# Patient Record
Sex: Female | Born: 1960 | Race: Black or African American | Hispanic: No | Marital: Single | Smoking: Former smoker
Health system: Southern US, Community
[De-identification: ages and names within clinical notes are randomized; demographics above are authoritative.]

## PROBLEM LIST (undated history)

## (undated) DIAGNOSIS — I10 Essential (primary) hypertension: Secondary | ICD-10-CM

## (undated) DIAGNOSIS — M6282 Rhabdomyolysis: Secondary | ICD-10-CM

## (undated) DIAGNOSIS — E785 Hyperlipidemia, unspecified: Secondary | ICD-10-CM

## (undated) HISTORY — PX: ABDOMINAL HYSTERECTOMY: SHX81

## (undated) HISTORY — DX: Essential (primary) hypertension: I10

## (undated) HISTORY — PX: REDUCTION MAMMAPLASTY: SUR839

## (undated) HISTORY — DX: Hyperlipidemia, unspecified: E78.5

---

## 1998-02-04 ENCOUNTER — Ambulatory Visit: Admission: RE | Admit: 1998-02-04 | Discharge: 1998-02-04 | Payer: Self-pay

## 1999-02-06 ENCOUNTER — Other Ambulatory Visit: Admission: RE | Admit: 1999-02-06 | Discharge: 1999-02-06 | Payer: Self-pay | Admitting: Obstetrics and Gynecology

## 1999-05-27 ENCOUNTER — Encounter: Payer: Self-pay | Admitting: Specialist

## 1999-05-30 ENCOUNTER — Ambulatory Visit (HOSPITAL_COMMUNITY): Admission: RE | Admit: 1999-05-30 | Discharge: 1999-05-31 | Payer: Self-pay | Admitting: Specialist

## 1999-05-30 ENCOUNTER — Encounter (INDEPENDENT_AMBULATORY_CARE_PROVIDER_SITE_OTHER): Payer: Self-pay

## 2000-09-01 ENCOUNTER — Other Ambulatory Visit: Admission: RE | Admit: 2000-09-01 | Discharge: 2000-09-01 | Payer: Self-pay | Admitting: Obstetrics and Gynecology

## 2000-10-05 ENCOUNTER — Encounter: Payer: Self-pay | Admitting: Obstetrics and Gynecology

## 2000-10-05 ENCOUNTER — Encounter: Admission: RE | Admit: 2000-10-05 | Discharge: 2000-10-05 | Payer: Self-pay | Admitting: Obstetrics and Gynecology

## 2000-10-11 ENCOUNTER — Encounter: Payer: Self-pay | Admitting: *Deleted

## 2000-10-11 ENCOUNTER — Ambulatory Visit (HOSPITAL_COMMUNITY): Admission: RE | Admit: 2000-10-11 | Discharge: 2000-10-11 | Payer: Self-pay | Admitting: *Deleted

## 2001-07-04 ENCOUNTER — Ambulatory Visit (HOSPITAL_BASED_OUTPATIENT_CLINIC_OR_DEPARTMENT_OTHER): Admission: RE | Admit: 2001-07-04 | Discharge: 2001-07-04 | Payer: Self-pay | Admitting: Specialist

## 2001-07-04 ENCOUNTER — Encounter (INDEPENDENT_AMBULATORY_CARE_PROVIDER_SITE_OTHER): Payer: Self-pay | Admitting: *Deleted

## 2002-01-16 ENCOUNTER — Other Ambulatory Visit: Admission: RE | Admit: 2002-01-16 | Discharge: 2002-01-16 | Payer: Self-pay | Admitting: Obstetrics and Gynecology

## 2002-12-13 ENCOUNTER — Encounter: Admission: RE | Admit: 2002-12-13 | Discharge: 2002-12-13 | Payer: Self-pay | Admitting: Obstetrics and Gynecology

## 2002-12-13 ENCOUNTER — Encounter: Payer: Self-pay | Admitting: Obstetrics and Gynecology

## 2003-04-20 ENCOUNTER — Other Ambulatory Visit: Admission: RE | Admit: 2003-04-20 | Discharge: 2003-04-20 | Payer: Self-pay | Admitting: Obstetrics and Gynecology

## 2004-02-26 ENCOUNTER — Encounter: Admission: RE | Admit: 2004-02-26 | Discharge: 2004-02-26 | Payer: Self-pay | Admitting: Obstetrics and Gynecology

## 2004-08-12 ENCOUNTER — Ambulatory Visit: Payer: Self-pay | Admitting: Family Medicine

## 2004-08-18 ENCOUNTER — Ambulatory Visit: Payer: Self-pay | Admitting: Family Medicine

## 2004-09-12 ENCOUNTER — Ambulatory Visit: Payer: Self-pay | Admitting: Family Medicine

## 2005-05-06 ENCOUNTER — Encounter: Admission: RE | Admit: 2005-05-06 | Discharge: 2005-05-06 | Payer: Self-pay | Admitting: Obstetrics and Gynecology

## 2005-07-16 ENCOUNTER — Ambulatory Visit: Payer: Self-pay | Admitting: Family Medicine

## 2005-08-10 ENCOUNTER — Ambulatory Visit: Payer: Self-pay | Admitting: Family Medicine

## 2006-09-28 ENCOUNTER — Encounter: Admission: RE | Admit: 2006-09-28 | Discharge: 2006-09-28 | Payer: Self-pay | Admitting: Obstetrics and Gynecology

## 2006-09-28 IMAGING — MG MM SCREEN MAMMOGRAM BILATERAL
4 series · 4 of 4 positions shown · non-contrast
Comparison: none

DG SCREEN MAMMOGRAM BILATERAL
Bilateral CC and MLO view(s) were taken.
Technologist: LOCKLEAR
Prior study comparison: [DATE], bilateral screening mammogram.

DIGITAL SCREENING MAMMOGRAM WITH CAD:
There are scattered fibroglandular densities.  There is no dominant mass, architectural distortion 
or calcification to suggest malignancy. Reduction changes are present.

[R CC]
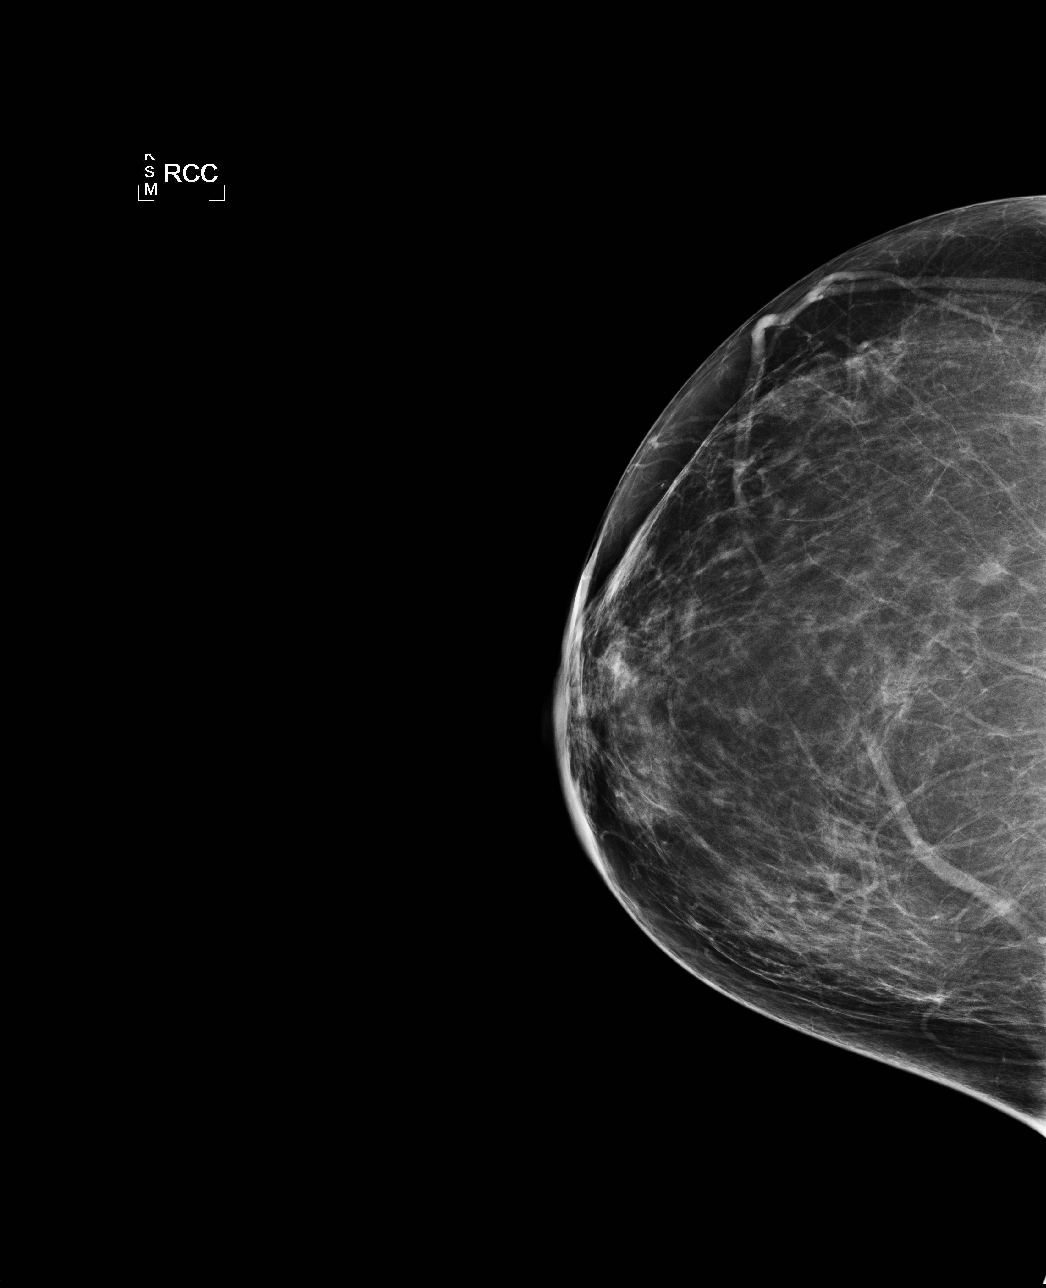

[L CC]
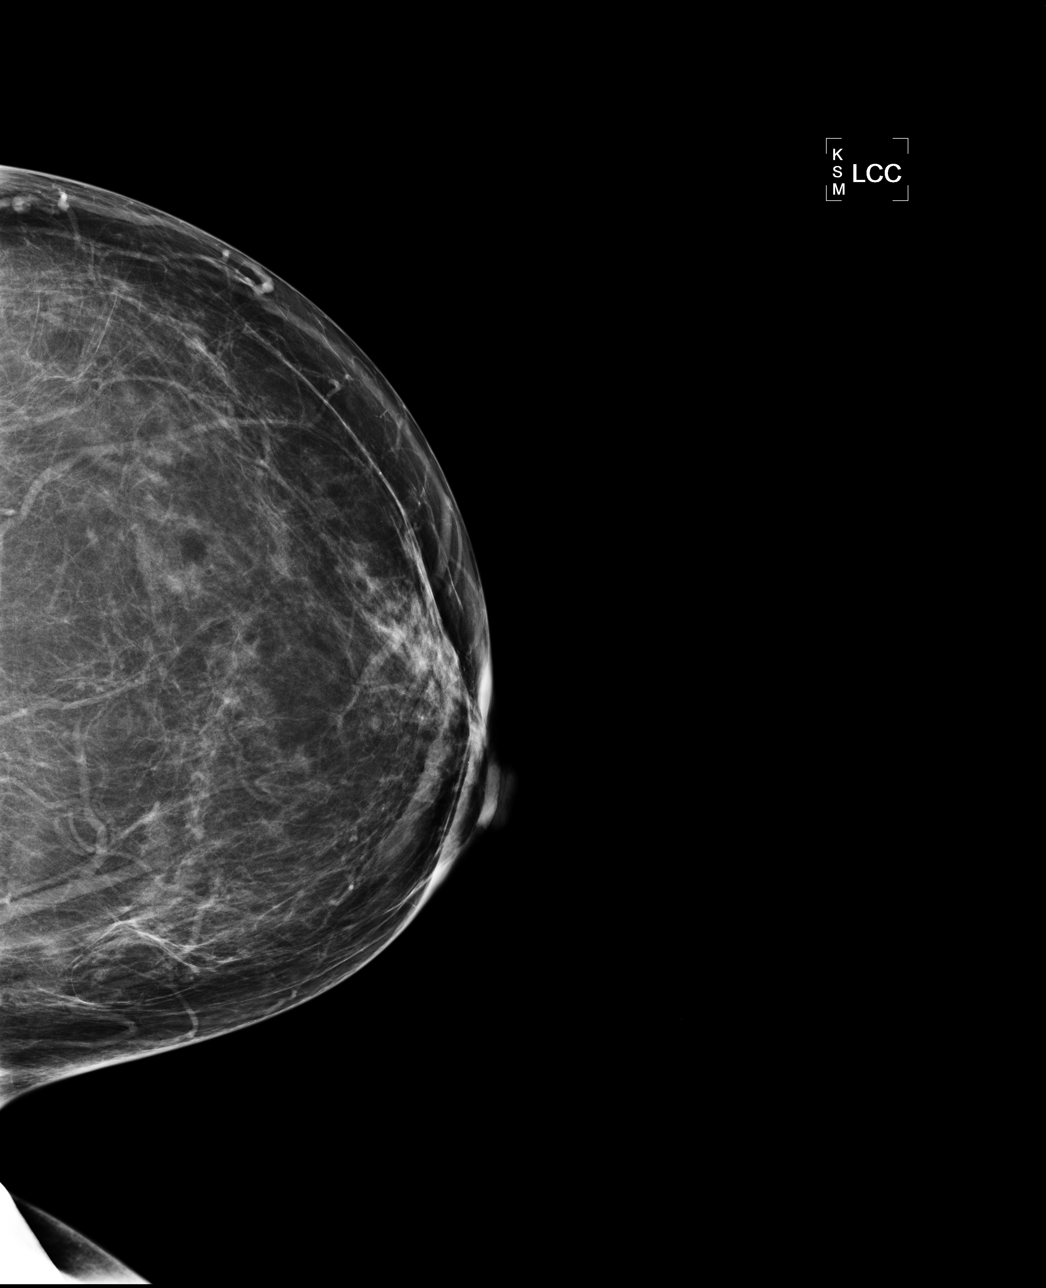

[L MLO]
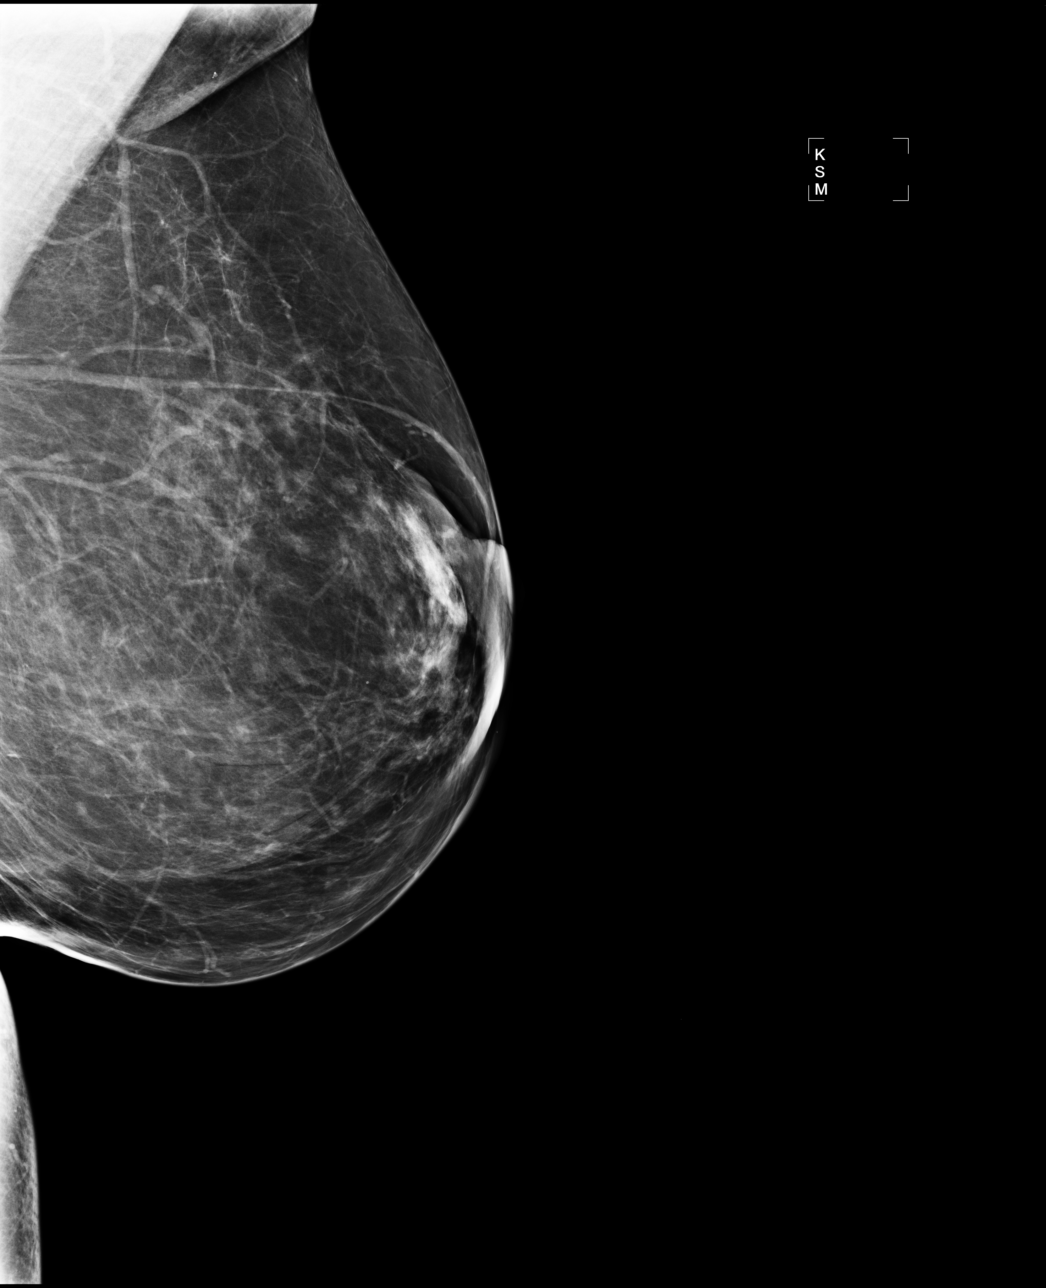

[R MLO]
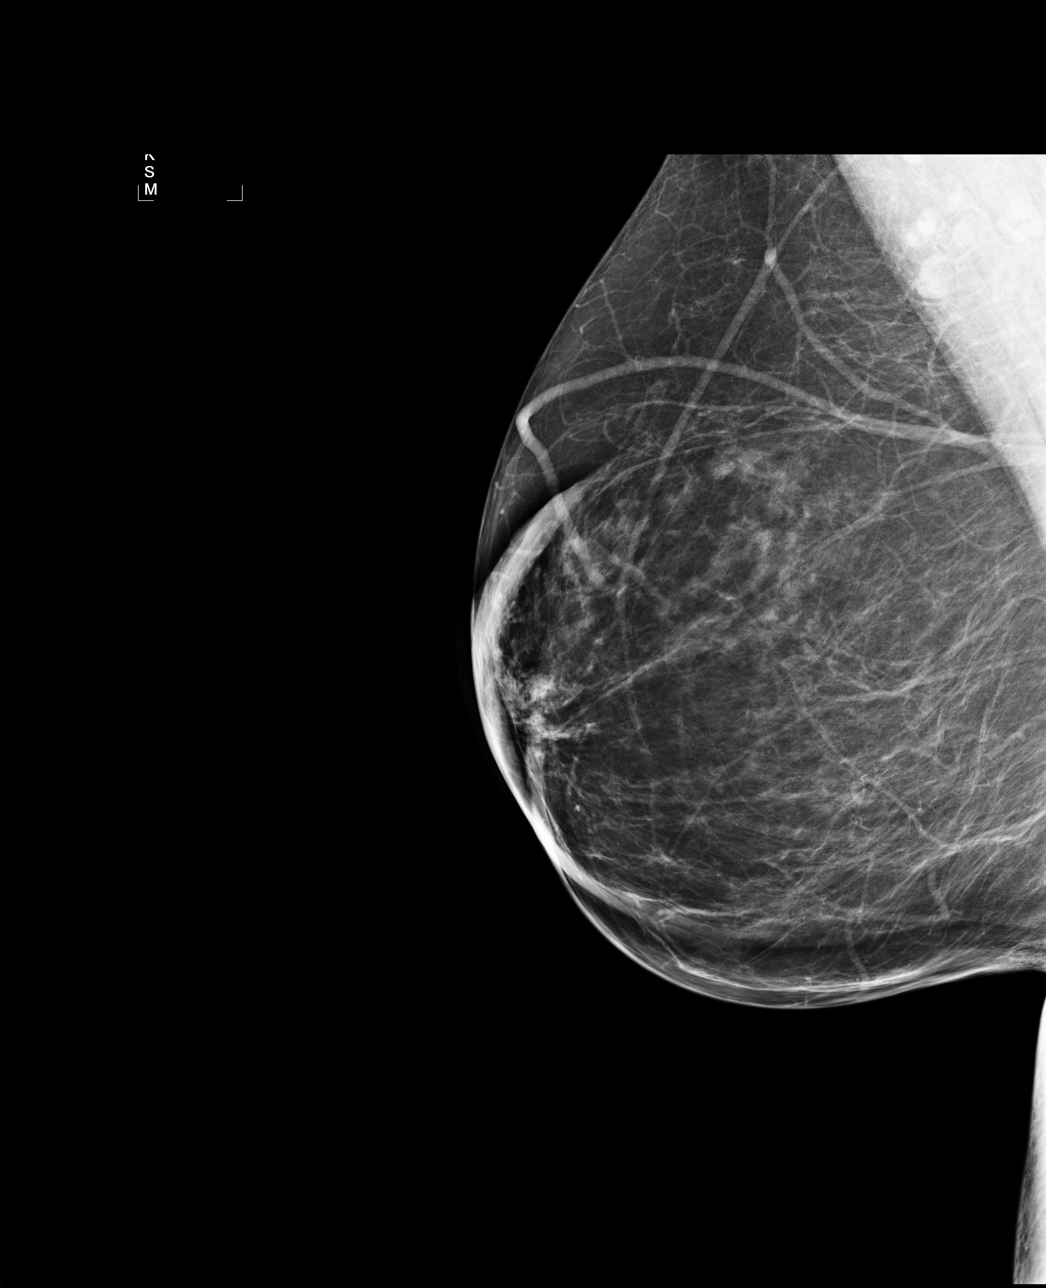

[4 of 4 positions shown; findings below may reference images not displayed]

IMPRESSION: No mammographic evidence of malignancy.  Suggest yearly screening mammography.

ASSESSMENT: Negative - BI-RADS 1

Screening mammogram in 1 year.
ANALYZED BY COMPUTER AIDED DETECTION. , THIS PROCEDURE WAS A DIGITAL MAMMOGRAM.

## 2007-02-25 ENCOUNTER — Ambulatory Visit: Payer: Self-pay | Admitting: Family Medicine

## 2007-02-25 DIAGNOSIS — R079 Chest pain, unspecified: Secondary | ICD-10-CM | POA: Insufficient documentation

## 2007-02-25 DIAGNOSIS — Z9189 Other specified personal risk factors, not elsewhere classified: Secondary | ICD-10-CM | POA: Insufficient documentation

## 2007-02-25 DIAGNOSIS — I10 Essential (primary) hypertension: Secondary | ICD-10-CM | POA: Insufficient documentation

## 2007-03-03 ENCOUNTER — Encounter: Payer: Self-pay | Admitting: Family Medicine

## 2007-03-03 LAB — CONVERTED CEMR LAB
ALT: 22 units/L (ref 0–35)
Basophils Relative: 0.6 % (ref 0.0–1.0)
Bilirubin, Direct: 0.1 mg/dL (ref 0.0–0.3)
CO2: 29 meq/L (ref 19–32)
Calcium: 9.6 mg/dL (ref 8.4–10.5)
Creatinine, Ser: 1 mg/dL (ref 0.4–1.2)
Eosinophils Relative: 2.8 % (ref 0.0–5.0)
GFR calc Af Amer: 76 mL/min
Glucose, Bld: 90 mg/dL (ref 70–99)
Hemoglobin: 13.1 g/dL (ref 12.0–15.0)
Lymphocytes Relative: 33.1 % (ref 12.0–46.0)
Monocytes Absolute: 0.5 10*3/uL (ref 0.2–0.7)
Neutro Abs: 3.7 10*3/uL (ref 1.4–7.7)
Platelets: 359 10*3/uL (ref 150–400)
RDW: 12.9 % (ref 11.5–14.6)
Total Bilirubin: 0.8 mg/dL (ref 0.3–1.2)
Total Protein: 7.5 g/dL (ref 6.0–8.3)
VLDL: 18 mg/dL (ref 0–40)
WBC: 6.6 10*3/uL (ref 4.5–10.5)

## 2007-03-09 ENCOUNTER — Ambulatory Visit: Payer: Self-pay

## 2007-03-09 ENCOUNTER — Encounter: Payer: Self-pay | Admitting: Family Medicine

## 2007-05-02 ENCOUNTER — Encounter (INDEPENDENT_AMBULATORY_CARE_PROVIDER_SITE_OTHER): Payer: Self-pay | Admitting: *Deleted

## 2007-06-07 ENCOUNTER — Ambulatory Visit: Payer: Self-pay | Admitting: Family Medicine

## 2007-06-13 ENCOUNTER — Encounter (INDEPENDENT_AMBULATORY_CARE_PROVIDER_SITE_OTHER): Payer: Self-pay | Admitting: *Deleted

## 2007-06-13 LAB — CONVERTED CEMR LAB
AST: 29 units/L (ref 0–37)
Albumin: 3.9 g/dL (ref 3.5–5.2)
Bilirubin, Direct: 0.1 mg/dL (ref 0.0–0.3)
Total Bilirubin: 0.8 mg/dL (ref 0.3–1.2)
Total Protein: 7.7 g/dL (ref 6.0–8.3)

## 2007-07-14 ENCOUNTER — Encounter: Payer: Self-pay | Admitting: Family Medicine

## 2007-07-18 ENCOUNTER — Telehealth (INDEPENDENT_AMBULATORY_CARE_PROVIDER_SITE_OTHER): Payer: Self-pay | Admitting: *Deleted

## 2007-07-28 ENCOUNTER — Encounter: Payer: Self-pay | Admitting: Family Medicine

## 2007-08-18 ENCOUNTER — Ambulatory Visit: Payer: Self-pay | Admitting: Family Medicine

## 2007-08-18 DIAGNOSIS — IMO0001 Reserved for inherently not codable concepts without codable children: Secondary | ICD-10-CM | POA: Insufficient documentation

## 2007-08-18 DIAGNOSIS — E785 Hyperlipidemia, unspecified: Secondary | ICD-10-CM | POA: Insufficient documentation

## 2007-08-25 ENCOUNTER — Telehealth (INDEPENDENT_AMBULATORY_CARE_PROVIDER_SITE_OTHER): Payer: Self-pay | Admitting: *Deleted

## 2007-08-25 LAB — CONVERTED CEMR LAB
CRP, High Sensitivity: 1 (ref 0.00–5.00)
Total CK: 322 units/L (ref 7–177)

## 2007-08-30 ENCOUNTER — Encounter (INDEPENDENT_AMBULATORY_CARE_PROVIDER_SITE_OTHER): Payer: Self-pay | Admitting: *Deleted

## 2007-10-04 ENCOUNTER — Encounter: Admission: RE | Admit: 2007-10-04 | Discharge: 2007-10-04 | Payer: Self-pay | Admitting: Obstetrics and Gynecology

## 2008-02-02 ENCOUNTER — Ambulatory Visit: Payer: Self-pay | Admitting: Family Medicine

## 2008-02-02 ENCOUNTER — Encounter (INDEPENDENT_AMBULATORY_CARE_PROVIDER_SITE_OTHER): Payer: Self-pay | Admitting: *Deleted

## 2008-02-02 DIAGNOSIS — R5381 Other malaise: Secondary | ICD-10-CM | POA: Insufficient documentation

## 2008-02-02 DIAGNOSIS — R5383 Other fatigue: Secondary | ICD-10-CM

## 2008-02-06 ENCOUNTER — Telehealth (INDEPENDENT_AMBULATORY_CARE_PROVIDER_SITE_OTHER): Payer: Self-pay | Admitting: *Deleted

## 2008-02-14 ENCOUNTER — Encounter (INDEPENDENT_AMBULATORY_CARE_PROVIDER_SITE_OTHER): Payer: Self-pay | Admitting: *Deleted

## 2008-02-14 LAB — CONVERTED CEMR LAB
ALT: 36 units/L — ABNORMAL HIGH (ref 0–35)
Albumin: 4.3 g/dL (ref 3.5–5.2)
Alkaline Phosphatase: 57 units/L (ref 39–117)
BUN: 14 mg/dL (ref 6–23)
Basophils Absolute: 0 10*3/uL (ref 0.0–0.1)
Basophils Relative: 0.5 % (ref 0.0–3.0)
Bilirubin, Direct: 0.1 mg/dL (ref 0.0–0.3)
CO2: 26 meq/L (ref 19–32)
Calcium: 10 mg/dL (ref 8.4–10.5)
Eosinophils Absolute: 0.1 10*3/uL (ref 0.0–0.7)
Eosinophils Relative: 2.3 % (ref 0.0–5.0)
GFR calc Af Amer: 86 mL/min
Glucose, Bld: 92 mg/dL (ref 70–99)
HCT: 39.5 % (ref 36.0–46.0)
HDL: 53.3 mg/dL (ref >39.0)
Hemoglobin: 13.4 g/dL (ref 12.0–15.0)
MCHC: 34 g/dL (ref 30.0–36.0)
MCV: 92.7 fL (ref 78.0–100.0)
Neutro Abs: 3.1 10*3/uL (ref 1.4–7.7)
RBC: 4.25 M/uL (ref 3.87–5.11)
TSH: 1.73 microintl units/mL (ref 0.35–5.50)
Total CK: 378 units/L (ref 7–177)
Total Protein: 7.8 g/dL (ref 6.0–8.3)
WBC: 6 10*3/uL (ref 4.5–10.5)

## 2008-02-15 ENCOUNTER — Encounter (INDEPENDENT_AMBULATORY_CARE_PROVIDER_SITE_OTHER): Payer: Self-pay | Admitting: *Deleted

## 2008-06-05 ENCOUNTER — Encounter: Payer: Self-pay | Admitting: Family Medicine

## 2008-08-24 ENCOUNTER — Telehealth: Payer: Self-pay | Admitting: Family Medicine

## 2008-08-24 ENCOUNTER — Emergency Department (HOSPITAL_BASED_OUTPATIENT_CLINIC_OR_DEPARTMENT_OTHER): Admission: EM | Admit: 2008-08-24 | Discharge: 2008-08-24 | Payer: Self-pay | Admitting: Emergency Medicine

## 2008-08-24 ENCOUNTER — Ambulatory Visit: Payer: Self-pay | Admitting: Diagnostic Radiology

## 2008-08-24 IMAGING — CR DG CHEST 2V
2 series · 2 of 2 positions shown · non-contrast
Comparison: None.

CLINICAL DATA: Chest pain.  Shortness of breath.  History of
hypertension.

CHEST - 2 VIEW [DATE]:

[w chest pa]
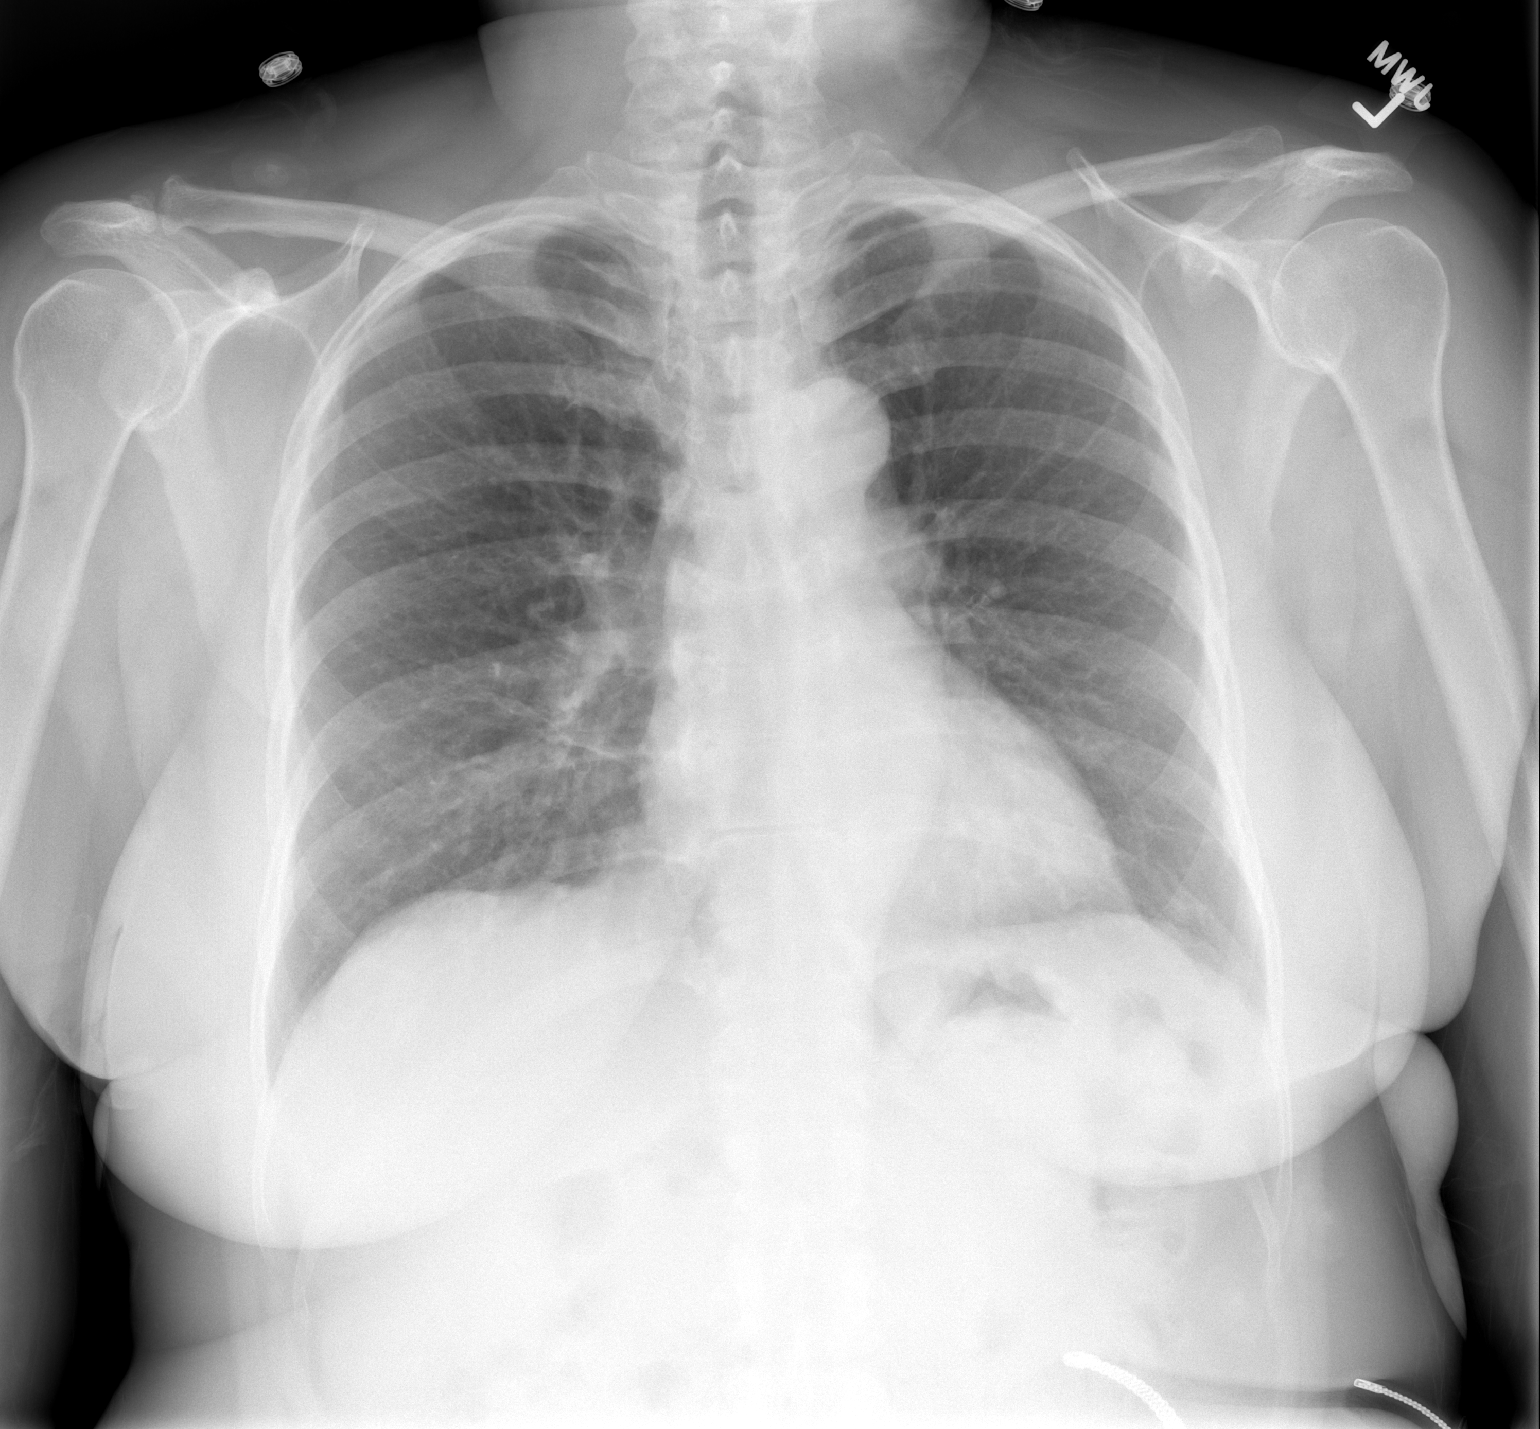

[w chest lat]
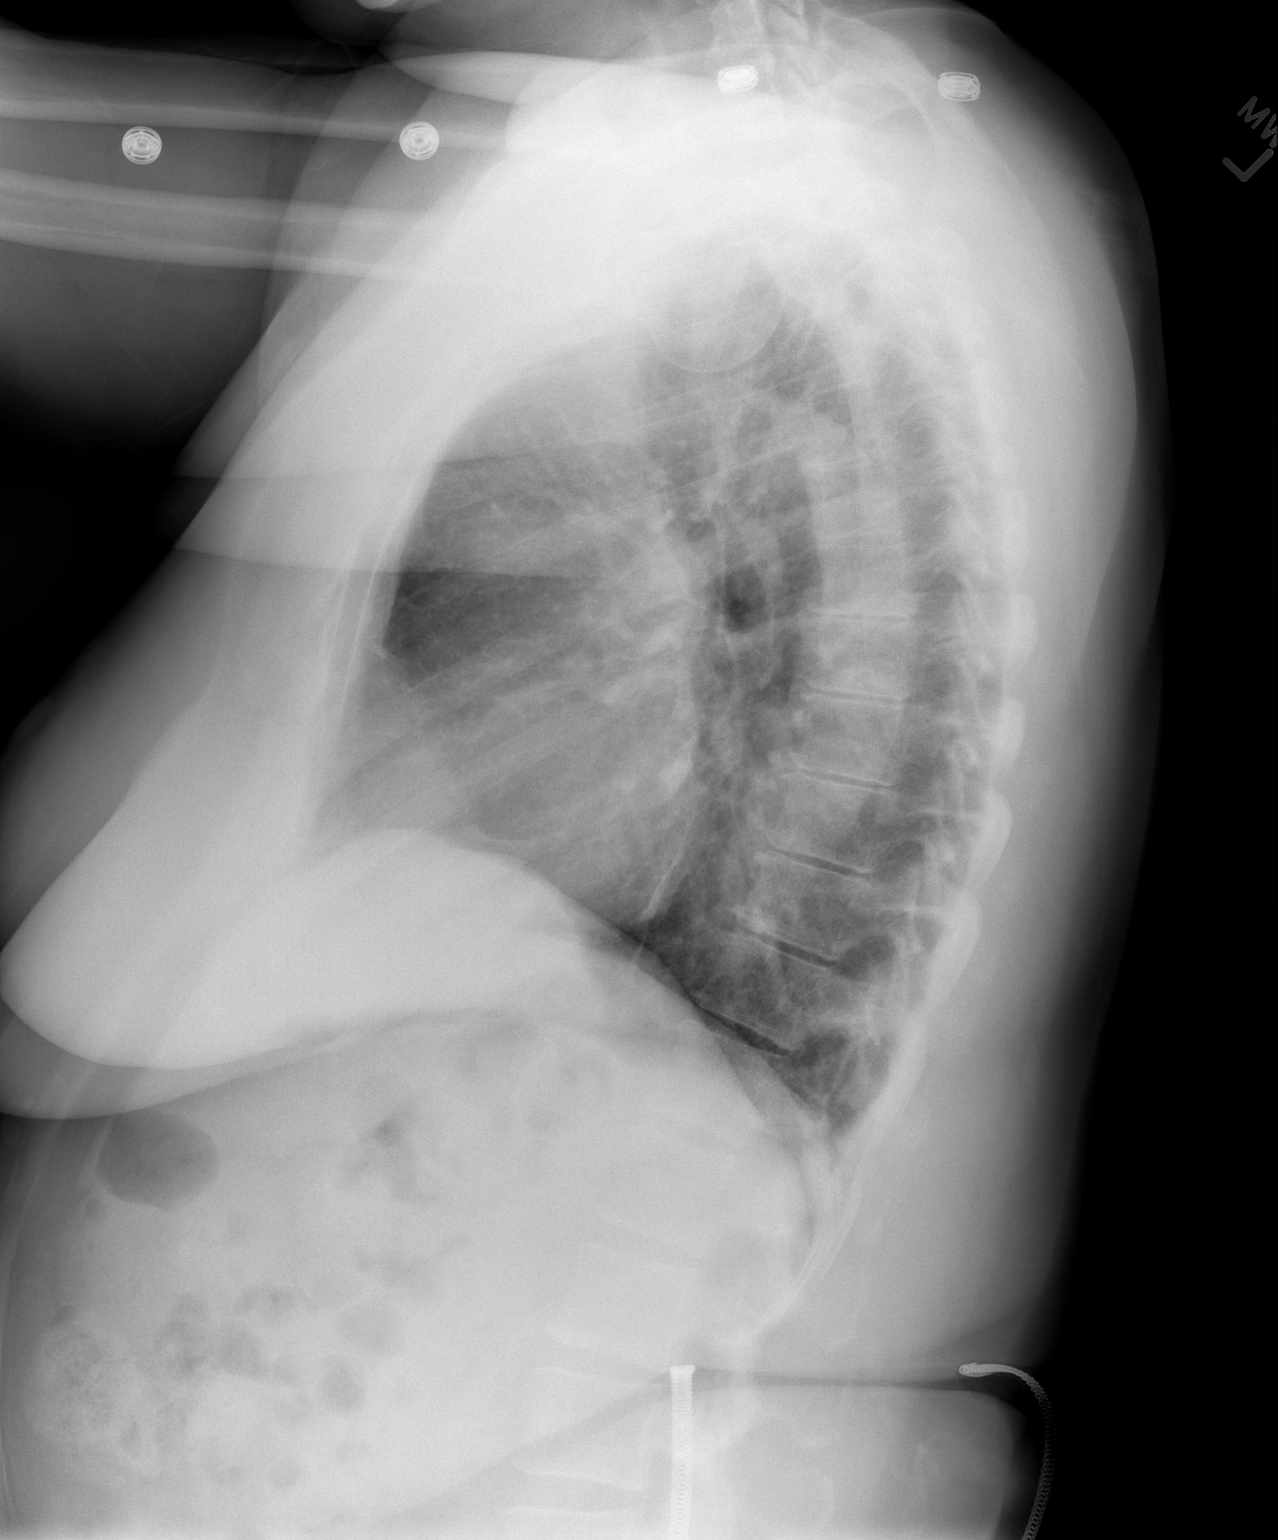

[2 of 2 positions shown; findings below may reference images not displayed]

FINDINGS: Heart size normal.  Thoracic aorta tortuous.  Hilar and
mediastinal contours otherwise unremarkable.  Lungs clear.
Bronchovascular markings normal.  Pulmonary vascularity normal.  No
pleural effusions.  Mild degenerative changes throughout the
thoracic spine.
IMPRESSION: No acute cardiopulmonary disease.  Tortuous thoracic aorta
consistent with the history of hypertension.

## 2008-11-26 ENCOUNTER — Ambulatory Visit: Payer: Self-pay | Admitting: Family Medicine

## 2008-11-27 ENCOUNTER — Encounter: Admission: RE | Admit: 2008-11-27 | Discharge: 2008-11-27 | Payer: Self-pay | Admitting: Obstetrics and Gynecology

## 2008-11-27 IMAGING — MG MM SCREEN MAMMOGRAM BILATERAL
4 series · 4 of 4 positions shown · non-contrast
Comparison: Prior studies.

DG SCREEN MAMMOGRAM BILATERAL
Bilateral CC and MLO view(s) were taken.
Prior study comparison: [DATE], DG screen mammogram bilateral.

DIGITAL SCREENING MAMMOGRAM WITH CAD:

[R CC]
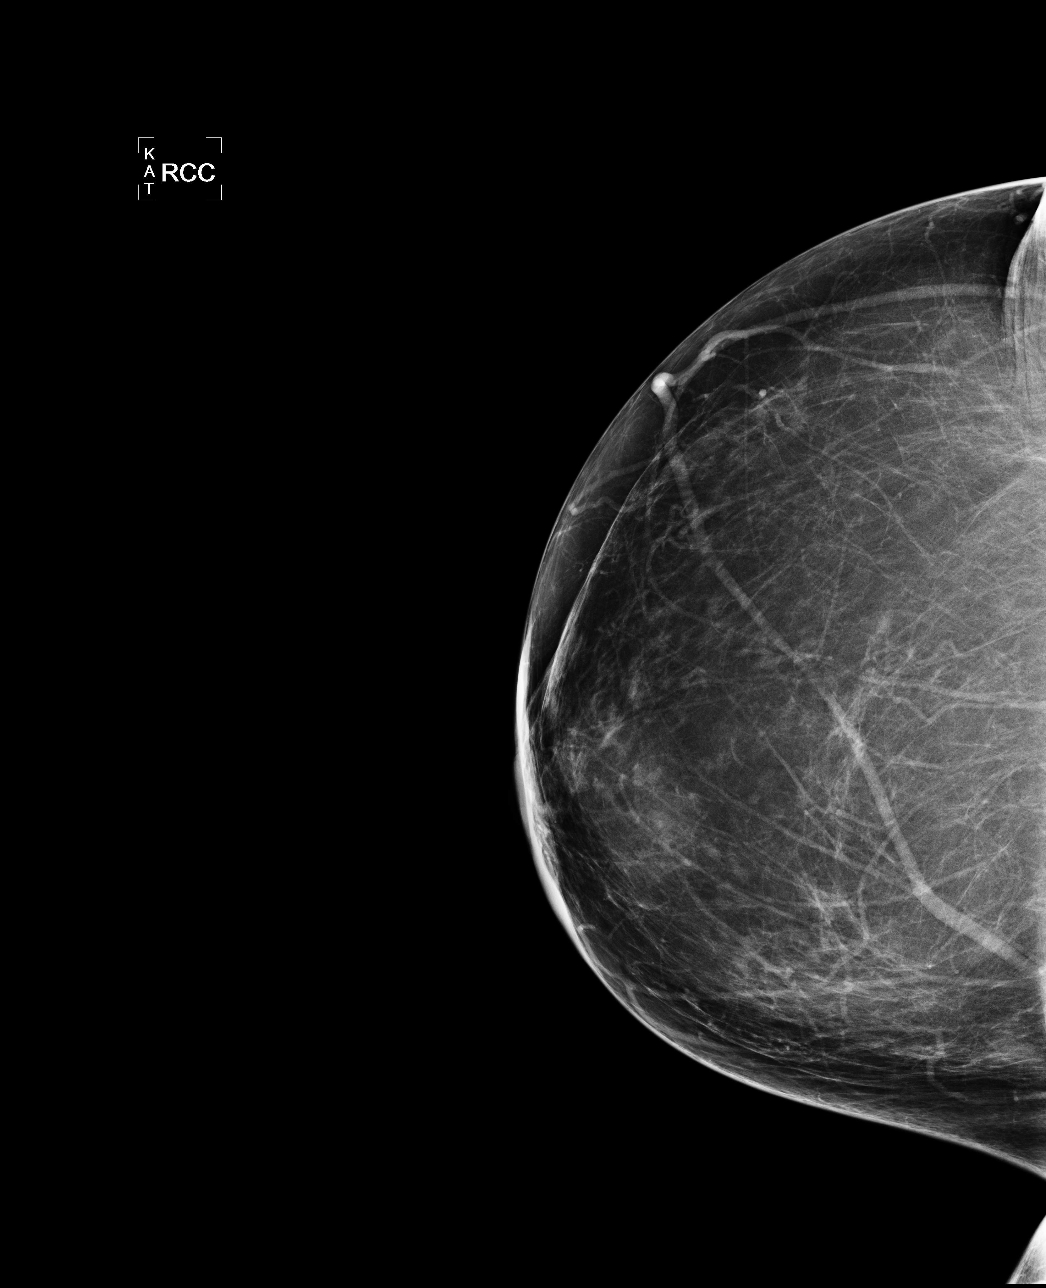

[L CC]
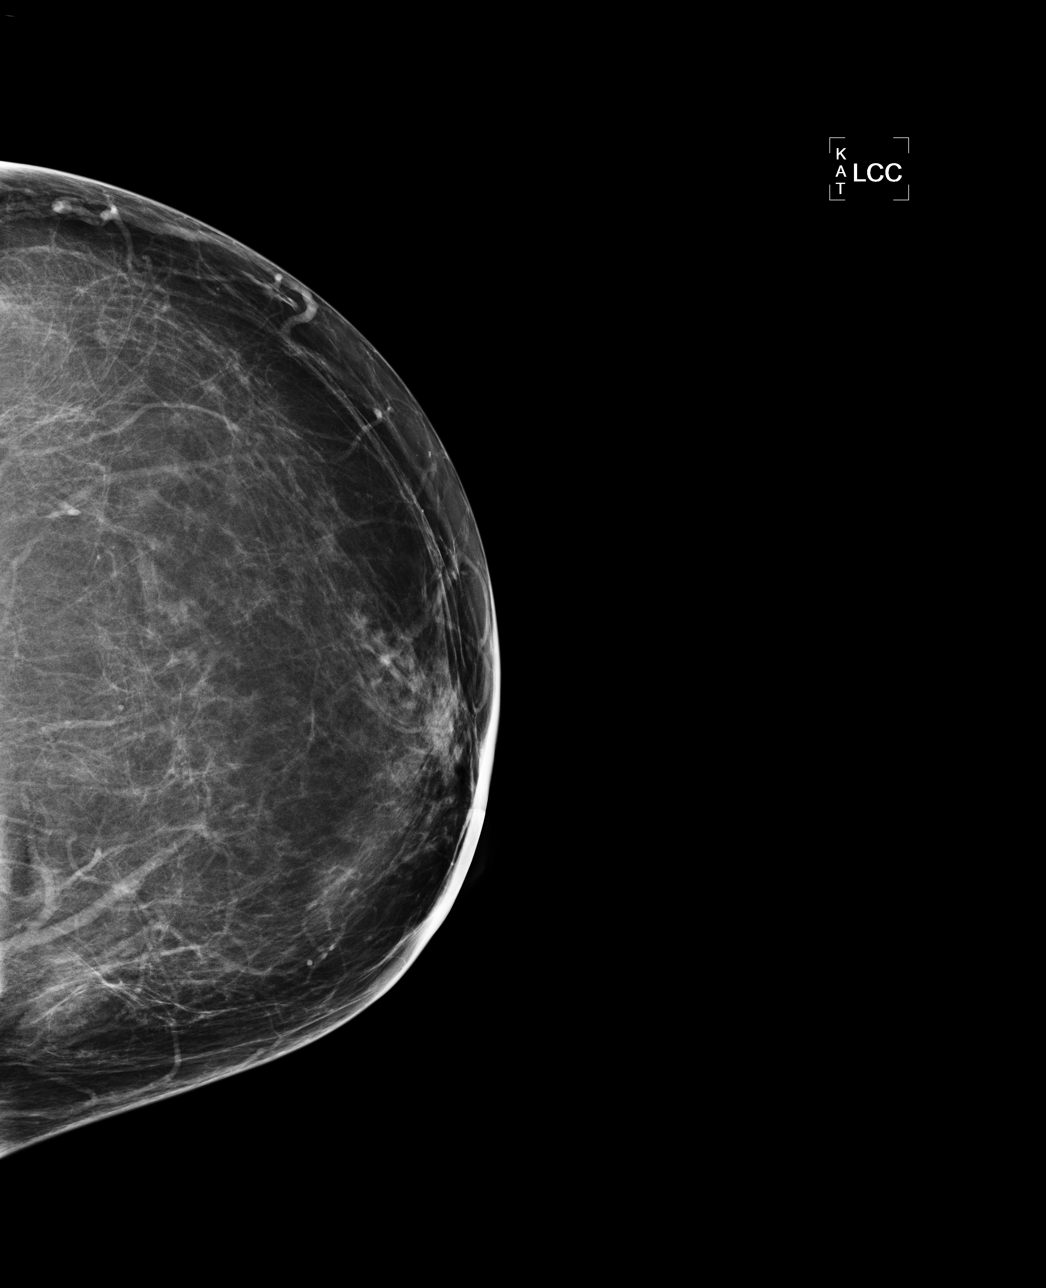

[L MLO]
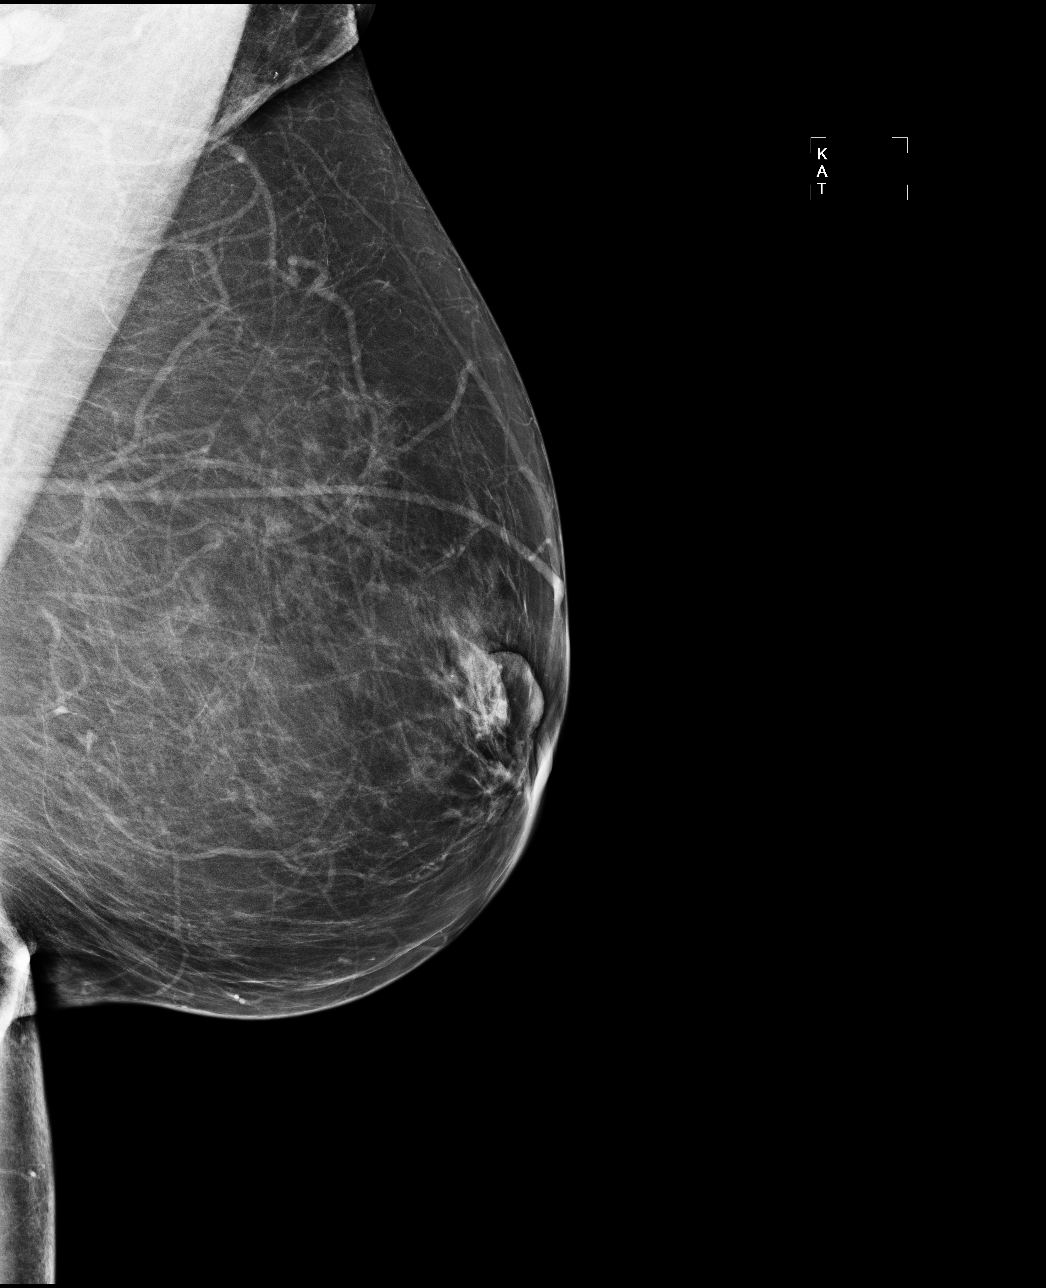

[R MLO]
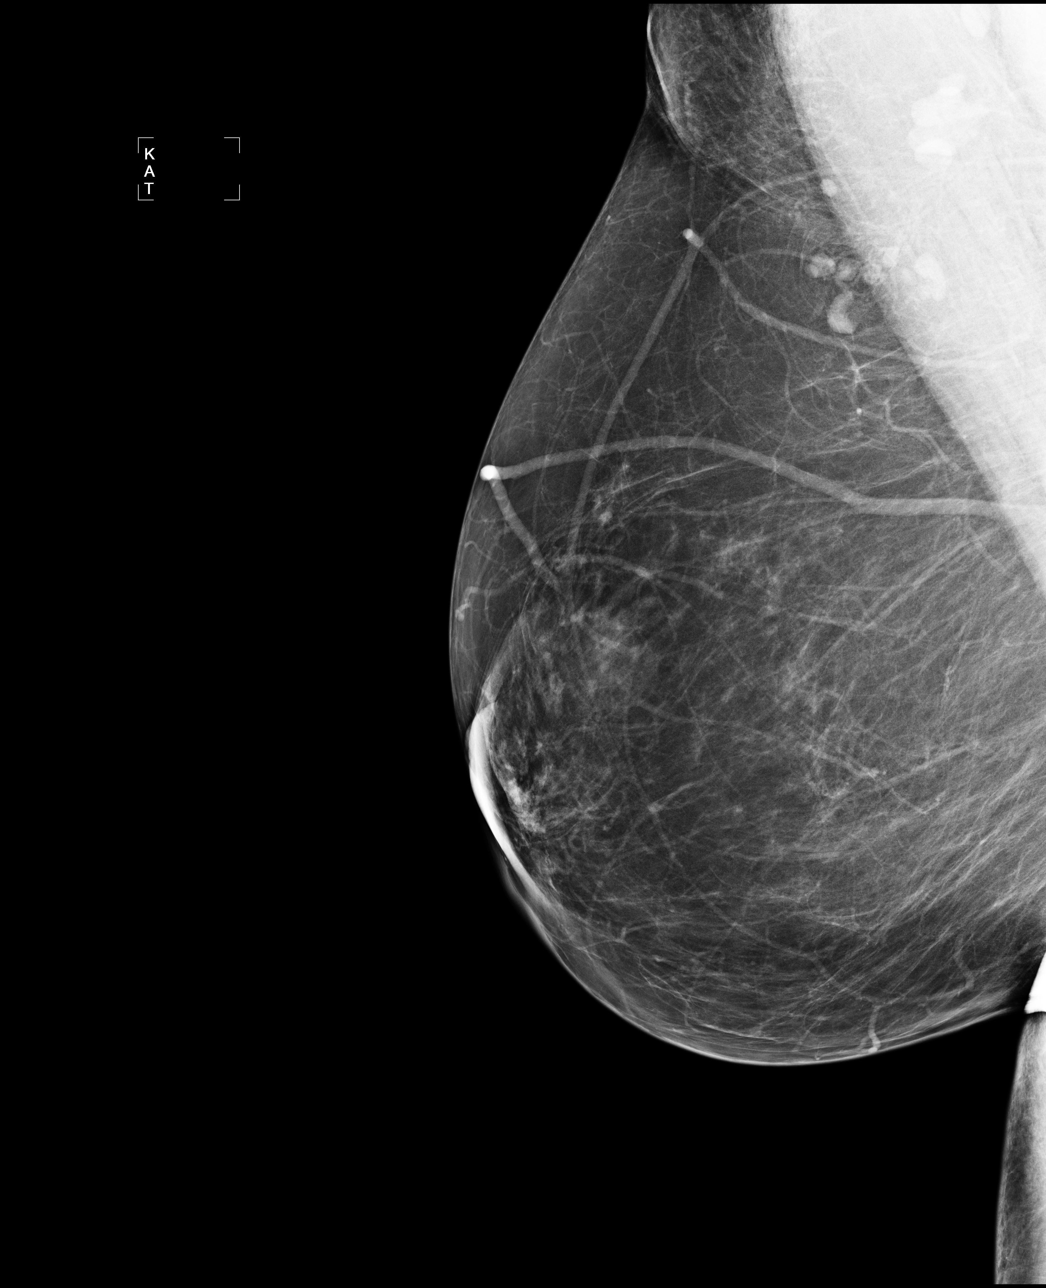

[4 of 4 positions shown; findings below may reference images not displayed]

The breast tissue is almost entirely fatty.  There is no dominant mass, architectural distortion or
calcification to suggest malignancy.

Images were processed with CAD.
IMPRESSION: No mammographic evidence of malignancy.  Suggest yearly screening mammography.

A result letter of this screening mammogram will be mailed directly to the patient.

ASSESSMENT: Negative - BI-RADS 1

Screening mammogram in 1 year.
ANALYZED BY COMPUTER AIDED DETECTION. , THIS PROCEDURE WAS A DIGITAL MAMMOGRAM.

## 2009-01-30 ENCOUNTER — Telehealth (INDEPENDENT_AMBULATORY_CARE_PROVIDER_SITE_OTHER): Payer: Self-pay | Admitting: *Deleted

## 2009-02-06 ENCOUNTER — Ambulatory Visit: Payer: Self-pay | Admitting: Family Medicine

## 2009-02-12 ENCOUNTER — Telehealth (INDEPENDENT_AMBULATORY_CARE_PROVIDER_SITE_OTHER): Payer: Self-pay | Admitting: *Deleted

## 2009-02-13 ENCOUNTER — Encounter (INDEPENDENT_AMBULATORY_CARE_PROVIDER_SITE_OTHER): Payer: Self-pay | Admitting: *Deleted

## 2009-02-13 LAB — CONVERTED CEMR LAB
AST: 22 units/L (ref 0–37)
Alkaline Phosphatase: 57 units/L (ref 39–117)
Bilirubin, Direct: 0 mg/dL (ref 0.0–0.3)
Calcium: 9.6 mg/dL (ref 8.4–10.5)
GFR calc non Af Amer: 85.21 mL/min (ref >60)
Glucose, Bld: 90 mg/dL (ref 70–99)
HDL: 51.9 mg/dL (ref >39.00)
Potassium: 4 meq/L (ref 3.5–5.1)
Sodium: 140 meq/L (ref 135–145)
Total CHOL/HDL Ratio: 5
Triglycerides: 90 mg/dL (ref 0.0–149.0)

## 2009-03-27 ENCOUNTER — Telehealth (INDEPENDENT_AMBULATORY_CARE_PROVIDER_SITE_OTHER): Payer: Self-pay | Admitting: *Deleted

## 2009-05-22 LAB — CONVERTED CEMR LAB: Pap Smear: NORMAL

## 2009-07-17 LAB — HM COLONOSCOPY: HM Colonoscopy: NORMAL

## 2009-07-22 ENCOUNTER — Telehealth (INDEPENDENT_AMBULATORY_CARE_PROVIDER_SITE_OTHER): Payer: Self-pay | Admitting: *Deleted

## 2009-07-22 ENCOUNTER — Encounter: Payer: Self-pay | Admitting: Family Medicine

## 2009-08-27 ENCOUNTER — Ambulatory Visit: Payer: Self-pay | Admitting: Family Medicine

## 2009-09-03 ENCOUNTER — Ambulatory Visit: Payer: Self-pay | Admitting: Family Medicine

## 2009-09-04 LAB — CONVERTED CEMR LAB
ALT: 20 units/L (ref 0–35)
AST: 19 units/L (ref 0–37)
Albumin: 3.8 g/dL (ref 3.5–5.2)
Alkaline Phosphatase: 57 units/L (ref 39–117)
Basophils Absolute: 0.1 10*3/uL (ref 0.0–0.1)
Bilirubin, Direct: 0 mg/dL (ref 0.0–0.3)
CO2: 27 meq/L (ref 19–32)
Chloride: 108 meq/L (ref 96–112)
Eosinophils Absolute: 0.2 10*3/uL (ref 0.0–0.7)
Glucose, Bld: 83 mg/dL (ref 70–99)
Hemoglobin: 12.6 g/dL (ref 12.0–15.0)
Lymphocytes Relative: 38.1 % (ref 12.0–46.0)
MCHC: 33.2 g/dL (ref 30.0–36.0)
Neutro Abs: 3.3 10*3/uL (ref 1.4–7.7)
Neutrophils Relative %: 51.6 % (ref 43.0–77.0)
Platelets: 306 10*3/uL (ref 150.0–400.0)
Potassium: 4.1 meq/L (ref 3.5–5.1)
RDW: 12.8 % (ref 11.5–14.6)
Sodium: 142 meq/L (ref 135–145)
TSH: 1.64 microintl units/mL (ref 0.35–5.50)
Total Protein: 7.5 g/dL (ref 6.0–8.3)

## 2009-09-18 ENCOUNTER — Telehealth (INDEPENDENT_AMBULATORY_CARE_PROVIDER_SITE_OTHER): Payer: Self-pay | Admitting: *Deleted

## 2009-11-27 ENCOUNTER — Encounter: Payer: Self-pay | Admitting: Family Medicine

## 2010-01-17 ENCOUNTER — Ambulatory Visit: Payer: Self-pay | Admitting: Family Medicine

## 2010-01-17 DIAGNOSIS — N951 Menopausal and female climacteric states: Secondary | ICD-10-CM | POA: Insufficient documentation

## 2010-01-22 ENCOUNTER — Encounter (INDEPENDENT_AMBULATORY_CARE_PROVIDER_SITE_OTHER): Payer: Self-pay | Admitting: *Deleted

## 2010-01-22 LAB — CONVERTED CEMR LAB
Eosinophils Absolute: 0.1 10*3/uL (ref 0.0–0.7)
Eosinophils Relative: 1.2 % (ref 0.0–5.0)
Lymphocytes Relative: 34.7 % (ref 12.0–46.0)
MCHC: 34.2 g/dL (ref 30.0–36.0)
MCV: 93.3 fL (ref 78.0–100.0)
Monocytes Absolute: 0.6 10*3/uL (ref 0.1–1.0)
Neutrophils Relative %: 55.9 % (ref 43.0–77.0)
Platelets: 350 10*3/uL (ref 150.0–400.0)
TSH: 1.84 microintl units/mL (ref 0.35–5.50)
WBC: 8.3 10*3/uL (ref 4.5–10.5)

## 2010-02-24 ENCOUNTER — Telehealth (INDEPENDENT_AMBULATORY_CARE_PROVIDER_SITE_OTHER): Payer: Self-pay | Admitting: *Deleted

## 2010-03-26 ENCOUNTER — Ambulatory Visit: Payer: Self-pay | Admitting: Family Medicine

## 2010-03-26 ENCOUNTER — Encounter: Payer: Self-pay | Admitting: Family Medicine

## 2010-03-26 DIAGNOSIS — F438 Other reactions to severe stress: Secondary | ICD-10-CM | POA: Insufficient documentation

## 2010-03-26 DIAGNOSIS — F4389 Other reactions to severe stress: Secondary | ICD-10-CM | POA: Insufficient documentation

## 2010-04-09 ENCOUNTER — Encounter: Admission: RE | Admit: 2010-04-09 | Discharge: 2010-04-09 | Payer: Self-pay | Admitting: Obstetrics and Gynecology

## 2010-04-28 ENCOUNTER — Ambulatory Visit: Payer: Self-pay | Admitting: Family Medicine

## 2010-04-28 DIAGNOSIS — M549 Dorsalgia, unspecified: Secondary | ICD-10-CM | POA: Insufficient documentation

## 2010-05-27 ENCOUNTER — Ambulatory Visit: Payer: Self-pay | Admitting: Family Medicine

## 2010-05-27 DIAGNOSIS — J019 Acute sinusitis, unspecified: Secondary | ICD-10-CM | POA: Insufficient documentation

## 2010-06-25 ENCOUNTER — Encounter: Payer: Self-pay | Admitting: Family Medicine

## 2010-06-25 ENCOUNTER — Ambulatory Visit
Admission: RE | Admit: 2010-06-25 | Discharge: 2010-06-25 | Payer: Self-pay | Source: Home / Self Care | Attending: Family Medicine | Admitting: Family Medicine

## 2010-06-27 ENCOUNTER — Telehealth: Payer: Self-pay | Admitting: Family Medicine

## 2010-07-08 NOTE — Letter (Signed)
Summary: Results Follow up Letter  [facility]  [address] [place], [place] [address]   Phone: [phone]  Fax: [phone]    01/22/2010 MRN: [id number]  KENNY STERN [address] [place], [place]  [address]  Dear Ms. Raina Mina,  The following are the results of your recent test(s):  Test         Result    Pap Smear:        Normal _____  Not Normal _____ Comments: ______________________________________________________ Cholesterol: LDL(Bad cholesterol):         Your goal is less than:         HDL (Good cholesterol):       Your goal is more than: Comments:  ______________________________________________________ Mammogram:        Normal _____  Not Normal _____ Comments:  ___________________________________________________________________ Hemoccult:        Normal _____  Not normal _______ Comments:    _____________________________________________________________________ Other Tests:  LABS DONE 01/17/10 ALL NORMAL.    We routinely do not discuss normal results over the telephone.  If you desire a copy of the results, or you have any questions about this information we can discuss them at your next office visit.   Sincerely,

## 2010-07-08 NOTE — Progress Notes (Signed)
Summary: LISINOPRIL REFILL  Phone Note Refill Request Message from:  Patient on February 24, 2010 1:16 PM  Refills Requested: Medication #1:  LISINOPRIL-HYDROCHLOROTHIAZIDE 10-12.5 MG TABS 1 by mouth once daily. NEEDS OFFICE VISIT BEFORE ADDITONAL REFILLS. [place] [place]   PT PHONE NUMBER-[phone]     PHARMACY TOLD PATIENT SHE HAD TO CALL us TO GET PRESCRIPTION SINCE SHE WAS OUT OF REFILLS  Initial call taken by: Jerolyn Shin,  February 24, 2010 1:18 PM    Prescriptions: LISINOPRIL-HYDROCHLOROTHIAZIDE 10-12.5 MG TABS (LISINOPRIL-HYDROCHLOROTHIAZIDE) 1 by mouth once daily. NEEDS OFFICE VISIT BEFORE ADDITONAL REFILLS.  #30 x 2   Entered by:   Jeremy Johann CMA   Authorized by:   Loreen Freud DO   Signed by:   Jeremy Johann CMA on 02/24/2010   Method used:   Faxed to ...       [facility]* (retail)       [address]       [place], [place]  [id number]       Ph: [phone]       Fax: [phone]   RxID:   [id number]

## 2010-07-08 NOTE — Assessment & Plan Note (Signed)
Summary: to discuss [facility]///sph   Vital Signs:  Patient profile:   50 year old female Weight:      183 pounds Pulse rate:   76 / minute BP sitting:   124 / 90  (left arm) Cuff size:   large  Vitals Entered By: Almeta Monas CMA Duncan Dull) (April 28, 2010 9:47 AM) CC: review [facility], Back Pain   History of Present Illness:       This is a 50 year old woman who presents with Back Pain.  The symptoms began 4 days ago.  Pt fell down 5 steps (wooden)  Friday night.  Pt woke up next day with low back pain on left that radiates down L thigh.  The patient denies fever, chills, weakness, loss of sensation, fecal incontinence, urinary incontinence, urinary retention, dysuria, rest pain, inability to work, and inability to care for self.  The pain is located in the left low back.  The pain began at home and after a fall.  The pain radiates to the left buttock.  The pain is made worse by standing or walking.  The pain is made better by inactivity.    Pt also here to go over labs  Current Medications (verified): 1)  Lisinopril-Hydrochlorothiazide 10-12.5 Mg Tabs (Lisinopril-Hydrochlorothiazide) .Marland Kitchen.. 1 By Mouth Once Daily. Needs Office Visit Before Additonal Refills. 2)  Vitamin D [address] Unit Caps (Ergocalciferol) .... Take 1 Tab By Mouth Weekly 3)  Simcor 1000-40 Mg Xr24h-Tab (Niacin-Simvastatin) .Marland Kitchen.. 1 By Mouth At Bedtime 4)  Citalopram Hydrobromide 20 Mg Tabs (Citalopram Hydrobromide) .... Take One Tablet By Mouth Daily 5)  Flexeril 10 Mg Tabs (Cyclobenzaprine Hcl) .Marland Kitchen.. 1 By Mouth Three Times A Day As Needed 6)  Vicodin Es 7.5-750 Mg Tabs (Hydrocodone-Acetaminophen) .Marland Kitchen.. 1 By Mouth Every 6 Hours As Needed  Allergies (verified): No Known Drug Allergies  Past History:  Past Medical History: Last updated: 03/26/2010 Hypertension Hyperlipidemia Current Problems:  OTHER ACUTE REACTIONS TO STRESS (ICD-308.3) HOT FLASHES (ICD-627.2) FATIGUE (ICD-780.79) MYALGIA  (ICD-729.1) HYPERLIPIDEMIA (ICD-272.4) PREVENTIVE HEALTH CARE (ICD-V70.0) FAMILY HISTORY DIABETES 1ST DEGREE RELATIVE (ICD-V18.0) FAMILY HISTORY OF CAD FEMALE 1ST DEGREE RELATIVE <50 (ICD-V17.3) FAMILY HISTORY BREAST CANCER 1ST DEGREE RELATIVE <50 (ICD-V16.3) REDUCTION MAMMOPLASTY, HX OF (ICD-V15.9) HYPERTENSION (ICD-401.9) CHEST PAIN (ICD-786.50)  Past Surgical History: Last updated: 02/25/2007 Hysterectomy (1994)  Family History: Last updated: 02/25/2007 Family History Breast cancer 1st degree relative <50 Family History of CAD Female 1st degree relative <50 Family History Diabetes 1st degree relative -- Sister deceased DKA,  F Family History Hypertension  Social History: Last updated: 02/25/2007 Occupation:  [employer] office Single Current Smoker Alcohol use-yes Drug use-no Regular exercise-yes  Risk Factors: Alcohol Use: <1 (08/27/2009) Caffeine Use: 1 (08/27/2009) Exercise: no (08/27/2009)  Risk Factors: Smoking Status: current (08/27/2009) Packs/Day: 3 cig (08/27/2009) Passive Smoke Exposure: no (08/27/2009)  Family History: Reviewed history from 02/25/2007 and no changes required. Family History Breast cancer 1st degree relative <50 Family History of CAD Female 1st degree relative <50 Family History Diabetes 1st degree relative -- Sister deceased DKA,  F Family History Hypertension  Social History: Reviewed history from 02/25/2007 and no changes required. Occupation:  Forensic scientist Single Current Smoker Alcohol use-yes Drug use-no Regular exercise-yes  Review of Systems      See HPI  Physical Exam  General:  Well-developed,well-nourished,in no acute distress; alert,appropriate and cooperative throughout examination Lungs:  Normal respiratory effort, chest expands symmetrically. Lungs are clear to auscultation, no crackles or wheezes. Heart:  normal  rate and no murmur.   Msk:  + L SLR no joint swelling and no joint warmth.   Extremities:  No clubbing,  cyanosis, edema, or deformity noted with normal full range of motion of all joints.   Neurologic:  strength normal in all extremities, gait normal, and DTRs symmetrical and normal.   Skin:  Intact without suspicious lesions or rashes Psych:  Cognition and judgment appear intact. Alert and cooperative with normal attention span and concentration. No apparent delusions, illusions, hallucinations   Impression & Recommendations:  Problem # 1:  HYPERLIPIDEMIA (ICD-272.4) see [facility] Her updated medication list for this problem includes:    Simcor 1000-40 Mg Xr24h-tab (Niacin-simvastatin) .Marland Kitchen... 1 by mouth at bedtime  Labs Reviewed: SGOT: 19 (09/03/2009)   SGPT: 20 (09/03/2009)   HDL:51.90 (02/06/2009), 53.3 (02/02/2008)  LDL:DEL (02/02/2008), DEL (02/25/2007)  Chol:275 (02/06/2009), 249 (02/02/2008)  Trig:90.0 (02/06/2009), 126 (02/02/2008)  Problem # 2:  BACK PAIN (ICD-724.5)  Her updated medication list for this problem includes:    Flexeril 10 Mg Tabs (Cyclobenzaprine hcl) .Marland Kitchen... 1 by mouth three times a day as needed    Vicodin Es 7.5-750 Mg Tabs (Hydrocodone-acetaminophen) .Marland Kitchen... 1 by mouth every 6 hours as needed  Discussed use of moist heat or ice, modified activities, medications, and stretching/strengthening exercises. Back care instructions given. To be seen in 2 weeks if no improvement; sooner if worsening of symptoms.   Complete Medication List: 1)  Lisinopril-hydrochlorothiazide 10-12.5 Mg Tabs (Lisinopril-hydrochlorothiazide) .Marland Kitchen.. 1 by mouth once daily. needs office visit before additonal refills. 2)  Vitamin D 82956 Unit Caps (Ergocalciferol) .... Take 1 tab by mouth weekly 3)  Simcor 1000-40 Mg Xr24h-tab (Niacin-simvastatin) .Marland Kitchen.. 1 by mouth at bedtime 4)  Citalopram Hydrobromide 20 Mg Tabs (Citalopram hydrobromide) .... Take one tablet by mouth daily 5)  Flexeril 10 Mg Tabs (Cyclobenzaprine hcl) .Marland Kitchen.. 1 by mouth three times a day as needed 6)  Vicodin Es 7.5-750 Mg  Tabs (Hydrocodone-acetaminophen) .Marland Kitchen.. 1 by mouth every 6 hours as needed  Patient Instructions: 1)  Most patients (90%) with low back pain will improve with time ( 2-6 weeks). Keep active but avoid activities that are painful. Apply moist heat and/or ice to lower back several times a day.  2)  check hep panal in 3 month 272.4   3)  Please schedule a follow-up appointment in 6 months----- labs 3-4 weeks prior to appointment  [facility] , bmp 272.4   Prescriptions: VICODIN ES 7.5-750 MG TABS (HYDROCODONE-ACETAMINOPHEN) 1 by mouth every 6 hours as needed  #30 x 0   Entered and Authorized by:   Loreen Freud DO   Signed by:   Loreen Freud DO on 04/28/2010   Method used:   Print then Give to Patient   RxID:   [id number] FLEXERIL 10 MG TABS (CYCLOBENZAPRINE HCL) 1 by mouth three times a day as needed  #30 x 0   Entered and Authorized by:   Loreen Freud DO   Signed by:   Loreen Freud DO on 04/28/2010   Method used:   Electronically to        [facility]* (retail)       [address]       [place], [place]  [address]       Ph: [phone]       Fax: [phone]   RxID:   [id number] SIMCOR 1000-40 MG XR24H-TAB (NIACIN-SIMVASTATIN) 1 by mouth at bedtime  #30 x 2   Entered and  Authorized by:   Loreen Freud DO   Signed by:   Loreen Freud DO on 04/28/2010   Method used:   Print then Give to Patient   RxID:   [id number]    Orders Added: 1)  Est. Patient Level III [[address]]

## 2010-07-08 NOTE — Medication Information (Signed)
Summary: Nonadherence with Lisinopril/BCBS  Nonadherence with Lisinopril/BCBS   Imported By: Lanelle Bal 12/05/2009 13:05:51  _____________________________________________________________________  External Attachment:    Type:   Image     Comment:   External Document

## 2010-07-08 NOTE — Progress Notes (Signed)
Summary: Refill Request  Phone Note Refill Request Message from:  Pharmacy on [facility] Fax #: [phone]  Refills Requested: Medication #1:  LISINOPRIL-HYDROCHLOROTHIAZIDE 10-12.5 MG TABS 1 by mouth once daily   Dosage confirmed as above?Dosage Confirmed   Last Refilled: 05/19/2009 Initial call taken by: Harold Barban,  July 22, 2009 1:18 PM    New/Updated Medications: LISINOPRIL-HYDROCHLOROTHIAZIDE 10-12.5 MG TABS (LISINOPRIL-HYDROCHLOROTHIAZIDE) 1 by mouth once daily. NEEDS OFFICE VISIT BEFORE ADDITONAL REFILLS. Prescriptions: LISINOPRIL-HYDROCHLOROTHIAZIDE 10-12.5 MG TABS (LISINOPRIL-HYDROCHLOROTHIAZIDE) 1 by mouth once daily. NEEDS OFFICE VISIT BEFORE ADDITONAL REFILLS.  #30 x 0   Entered by:   Army Fossa CMA   Authorized by:   Loreen Freud DO   Signed by:   Army Fossa CMA on 07/22/2009   Method used:   Electronically to        [facility]* (retail)       [address]       [place], [place]  [id number]       Ph: [phone]       Fax: [phone]   RxID:   [id number]

## 2010-07-08 NOTE — Progress Notes (Signed)
Summary: Lab results   Phone Note Outgoing Call   Call placed by: Army Fossa CMA,  September 18, 2009 2:12 PM Reason for Call: Discuss lab or test results Summary of Call: Regarding lab results, LMTCB:  change zocor to simcor 40 / 500 1 by mouth at bedtime #30  2 refills----take tylenol or asa 30 min before and take with low fat snack to lessen possiblilty of hot flashes-----recheck 3 month  272.4 hep, NMR Signed by Loreen Freud DO on 09/18/2009 at 2:06 PM  Follow-up for Phone Call        LMTCB. Army Fossa CMA  September 19, 2009 10:31 AM   Additional Follow-up for Phone Call Additional follow up Details #1::        LMTCB. Army Fossa CMA  September 20, 2009 10:38 AM     Additional Follow-up for Phone Call Additional follow up Details #2::    Pt is aware. Army Fossa CMA  September 20, 2009 4:45 PM   New/Updated Medications: SIMCOR 500-40 MG XR24H-TAB (NIACIN-SIMVASTATIN) 1 by mouth at bedtime. Prescriptions: SIMCOR 500-40 MG XR24H-TAB (NIACIN-SIMVASTATIN) 1 by mouth at bedtime.  #30 x 2   Entered by:   Army Fossa CMA   Authorized by:   Loreen Freud DO   Signed by:   Army Fossa CMA on 09/20/2009   Method used:   Electronically to        [facility]* (retail)       [address]       [place], [place]  [id number]       Ph: [phone]       Fax: [phone]   RxID:   [id number]

## 2010-07-08 NOTE — Assessment & Plan Note (Signed)
Summary: fatigue/cbs--Rm 16   Vital Signs:  Patient profile:   49 year old female Height:      64.75 inches Weight:      184 pounds BMI:     30.97 Temp:     97.9 degrees F oral Pulse rate:   78 / minute Pulse rhythm:   regular Resp:     18 per minute BP sitting:   130 / 98  (left arm) Cuff size:   large  Vitals Entered By: Mervin Kung CMA Duncan Dull) (January 17, 2010 11:09 AM) CC: Room 16  Pt states she is having increased fatigue and hot flashes x  1 week.  Is Patient Diabetic? No   History of Present Illness: 50 yo woman here today for fatigue.  sxs started 1-2 weeks ago.  hot flashes have also increased during this period.  s/p hysterectomy, ovaries remain.  denies N/V/D.  recent increased seasonal allergies- seeing [facility].  not sleeping well at night b/c of hot flashes.  able to fall asleep w/out difficulty but hot flases are waking patient.  slept well last night, feeling better today.  Problems Prior to Update: 1)  Fatigue  (ICD-780.79) 2)  Myalgia  (ICD-729.1) 3)  Hyperlipidemia  (ICD-272.4) 4)  Preventive Health Care  (ICD-V70.0) 5)  Family History Diabetes 1st Degree Relative  (ICD-V18.0) 6)  Family History of Cad Female 1st Degree Relative <50  (ICD-V17.3) 7)  Family History Breast Cancer 1st Degree Relative <50  (ICD-V16.3) 8)  Reduction Mammoplasty, Hx of  (ICD-V15.9) 9)  Hypertension  (ICD-401.9) 10)  Chest Pain  (ICD-786.50)  Current Medications (verified): 1)  Lisinopril-Hydrochlorothiazide 10-12.5 Mg Tabs (Lisinopril-Hydrochlorothiazide) .Marland Kitchen.. 1 By Mouth Once Daily. Needs Office Visit Before Additonal Refills. 2)  Vitamin D 46962 Unit Caps (Ergocalciferol) .... Take 1 Tab By Mouth Weekly 3)  Simcor 500-40 Mg Xr24h-Tab (Niacin-Simvastatin) .Marland Kitchen.. 1 By Mouth At Bedtime. 4)  Citalopram Hydrobromide 20 Mg Tabs (Citalopram Hydrobromide) .... Take One Tablet By Mouth Daily  Allergies (verified): No Known Drug Allergies  Review of Systems      See  HPI  Physical Exam  General:  Well-developed,well-nourished,in no acute distress; alert,appropriate and cooperative throughout examination Head:  Normocephalic and atraumatic without obvious abnormalities. No apparent alopecia or balding. Neck:  No deformities, masses, or tenderness noted Lungs:  Normal respiratory effort, chest expands symmetrically. Lungs are clear to auscultation, no crackles or wheezes. Heart:  normal rate and no murmur.   Pulses:  +2 carotid, radial, DP Extremities:  No clubbing, cyanosis, edema, or deformity noted  Psych:  Cognition and judgment appear intact. Alert and cooperative with normal attention span and concentration. No apparent delusions, illusions, hallucinations   Impression & Recommendations:  Problem # 1:  FATIGUE (ICD-780.79) Assessment Unchanged pt's sxs most likely related to poor sleep but will check labs to r/o anemia or thyroid abnormality.  will start SSRI to improve sleep and hot flashes. Orders: Venipuncture ([address]) TLB-CBC Platelet - w/Differential (85025-CBCD) TLB-TSH (Thyroid Stimulating Hormone) (84443-TSH)  Problem # 2:  HOT FLASHES (ICD-627.2) Assessment: New pt most likely perimenopausal but has no menses to guide her.  already taking OTC women's supplement containing black cohash.  will start SSRI for vasomotor sxs.  Pt expresses understanding and is in agreement w/ this plan.  Complete Medication List: 1)  Lisinopril-hydrochlorothiazide 10-12.5 Mg Tabs (Lisinopril-hydrochlorothiazide) .Marland Kitchen.. 1 by mouth once daily. needs office visit before additonal refills. 2)  Vitamin D 13244 Unit Caps (Ergocalciferol) .... Take 1 tab by mouth weekly  3)  Simcor 500-40 Mg Xr24h-tab (Niacin-simvastatin) .Marland Kitchen.. 1 by mouth at bedtime. 4)  Citalopram Hydrobromide 20 Mg Tabs (Citalopram hydrobromide) .... Take one tablet by mouth daily  Patient Instructions: 1)  Follow up w/ Dr Laury Axon in 1 month to recheck energy and hot flashes 2)  Continue the Black  Cohash Supplement 3)  Start the Citalopram daily- this should help both mood and hot flashes 4)  Drink plenty of fluids 5)  We'll notify you of your lab results 6)  Hang in there!! Prescriptions: CITALOPRAM HYDROBROMIDE 20 MG TABS (CITALOPRAM HYDROBROMIDE) take one tablet by mouth daily  #30 x 3   Entered and Authorized by:   Neena Rhymes MD   Signed by:   Neena Rhymes MD on 01/17/2010   Method used:   Electronically to        [facility]* (retail)       [address]       [place], [place]  [address]       Ph: [phone]       Fax: [phone]   RxID:   [id number]   Current Allergies (reviewed today): No known allergies   Appended Document: fatigue/cbs--Rm 16

## 2010-07-08 NOTE — Letter (Signed)
Summary: Out of Work  [facility]  [address] [place], [place] [address]   Phone: [phone]  Fax: [phone]      January 17, 2010   Employee:  SOLASH TULLO    To Whom It May Concern:   For Medical reasons, please excuse the above named employee from work for the following dates:  Start: 01/17/10    End:   01/17/10    If you need additional information, please feel free to contact our office.         Sincerely,    Mervin Kung CMA (AAMA)

## 2010-07-08 NOTE — Assessment & Plan Note (Signed)
Summary: CHECK UP/MED REFILL//PH   Vital Signs:  Patient profile:   50 year old female Weight:      183.0 pounds Temp:     97.8 degrees F oral Pulse rate:   76 / minute Pulse rhythm:   regular BP sitting:   130 / 80  (right arm) Cuff size:   large  Vitals Entered By: Almeta Monas CMA Duncan Dull) (March 26, 2010 11:11 AM) CC: 6 mo f/u -- pt c/o feeling  tired all of the time   History of Present Illness: Pt here for 6 month f/u.  Pt c/o fatigue and extreme stress.  Pt is not taking celexa because it made her feel bad.  Pt knows its her job that is making her so stressed out but can not get a new job.  Pt states she just wants to go home and go to bed when she is done at work.   Pt denies feeling of anxiety or depression.    Current Medications (verified): 1)  Lisinopril-Hydrochlorothiazide 10-12.5 Mg Tabs (Lisinopril-Hydrochlorothiazide) .Marland Kitchen.. 1 By Mouth Once Daily. Needs Office Visit Before Additonal Refills. 2)  Vitamin D 60454 Unit Caps (Ergocalciferol) .... Take 1 Tab By Mouth Weekly 3)  Simcor 500-40 Mg Xr24h-Tab (Niacin-Simvastatin) .Marland Kitchen.. 1 By Mouth At Bedtime. 4)  Citalopram Hydrobromide 20 Mg Tabs (Citalopram Hydrobromide) .... Take One Tablet By Mouth Daily  Allergies (verified): No Known Drug Allergies  Past History:  Past medical, surgical, family and social histories (including risk factors) reviewed for relevance to current acute and chronic problems.  Past Medical History: Hypertension Hyperlipidemia Current Problems:  OTHER ACUTE REACTIONS TO STRESS (ICD-308.3) HOT FLASHES (ICD-627.2) FATIGUE (ICD-780.79) MYALGIA (ICD-729.1) HYPERLIPIDEMIA (ICD-272.4) PREVENTIVE HEALTH CARE (ICD-V70.0) FAMILY HISTORY DIABETES 1ST DEGREE RELATIVE (ICD-V18.0) FAMILY HISTORY OF CAD FEMALE 1ST DEGREE RELATIVE <50 (ICD-V17.3) FAMILY HISTORY BREAST CANCER 1ST DEGREE RELATIVE <50 (ICD-V16.3) REDUCTION MAMMOPLASTY, HX OF (ICD-V15.9) HYPERTENSION (ICD-401.9) CHEST PAIN  (ICD-786.50)  Past Surgical History: Reviewed history from 02/25/2007 and no changes required. Hysterectomy (1994)  Family History: Reviewed history from 02/25/2007 and no changes required. Family History Breast cancer 1st degree relative <50 Family History of CAD Female 1st degree relative <50 Family History Diabetes 1st degree relative -- Sister deceased DKA,  F Family History Hypertension  Social History: Reviewed history from 02/25/2007 and no changes required. Occupation:  Forensic scientist Single Current Smoker Alcohol use-yes Drug use-no Regular exercise-yes  Review of Systems      See HPI  Physical Exam  General:  Well-developed,well-nourished,in no acute distress; alert,appropriate and cooperative throughout examination Neck:  No deformities, masses, or tenderness noted. Lungs:  Normal respiratory effort, chest expands symmetrically. Lungs are clear to auscultation, no crackles or wheezes. Heart:  Normal rate and regular rhythm. S1 and S2 normal without gallop, murmur, click, rub or other extra sounds. Neurologic:  alert & oriented X3.   Psych:  Cognition and judgment appear intact. Alert and cooperative with normal attention span and concentration. No apparent delusions, illusions, hallucinations   Impression & Recommendations:  Problem # 1:  OTHER ACUTE REACTIONS TO STRESS (ICD-308.3) pt here > 30 min---more than 50%  face to face  Problem # 2:  CHEST PAIN UNSPECIFIED (ICD-786.50)  Orders: EKG w/ Interpretation (93000)  Complete Medication List: 1)  Lisinopril-hydrochlorothiazide 10-12.5 Mg Tabs (Lisinopril-hydrochlorothiazide) .Marland Kitchen.. 1 by mouth once daily. needs office visit before additonal refills. 2)  Vitamin D 09811 Unit Caps (Ergocalciferol) .... Take 1 tab by mouth weekly 3)  Simcor 500-40 Mg Xr24h-tab (Niacin-simvastatin) .Marland KitchenMarland KitchenMarland Kitchen  1 by mouth at bedtime. 4)  Citalopram Hydrobromide 20 Mg Tabs (Citalopram hydrobromide) .... Take one tablet by mouth daily  Other  Orders: Venipuncture (95284) T- * Misc. Laboratory test [phone])  Patient Instructions: 1)  We will call you when your labs come in to make an appointment to f/u Prescriptions: LISINOPRIL-HYDROCHLOROTHIAZIDE 10-12.5 MG TABS (LISINOPRIL-HYDROCHLOROTHIAZIDE) 1 by mouth once daily. NEEDS OFFICE VISIT BEFORE ADDITONAL REFILLS.  #30 x 2   Entered and Authorized by:   Loreen Freud DO   Signed by:   Loreen Freud DO on 03/26/2010   Method used:   Electronically to        [facility]* (retail)       [address]       [place], [place]  [id number]       Ph: [phone]       Fax: [phone]   RxID:   [id number]    Orders Added: 1)  Venipuncture [60109] 2)  T- * Misc. Laboratory test [99999] 3)  EKG w/ Interpretation [93000] 4)  Est. Patient Level IV [32355]

## 2010-07-08 NOTE — Letter (Signed)
Summary: Work Dietitian at [employer]  [address] [place], [place] [address]   Phone: [phone]  Fax: [phone]    Today's Date: April 28, 2010  Name of Patient: Carrie [name]  The above named patient had a medical visit today at:  13a.  Please take this into consideration when reviewing the time away from work/school.    Special Instructions:  [  ] None  [  ] To be off the remainder of today, returning to the normal work / school schedule tomorrow.  [  ] To be off until the next scheduled appointment on ______________________.  [ X ] Other ___Pt is unable to sit long periods of time secondary to back injury on Friday--- Please approve pt to leave half day  04/28/2010 -- 04/30/2010 if needed. _____________________________________________________________ ________________________________________________________________________   Sincerely yours,   Loreen Freud DO

## 2010-07-08 NOTE — Assessment & Plan Note (Signed)
Summary: CPX/KDC   Vital Signs:  Patient profile:   50 year old female Weight:      184 pounds Temp:     97.9 degrees F oral Pulse rate:   72 / minute Resp:     20 per minute BP sitting:   122 / 90  (left arm)  Vitals Entered By: Jeremy Johann CMA (August 27, 2009 12:52 PM) CC: cpx, no pap, not fasting Comments REVIEWED MED LIST, PATIENT AGREED DOSE AND INSTRUCTION CORRECT    History of Present Illness: Pt here for cpe ,  Pt with no complaints.    Preventive Screening-Counseling & Management  Alcohol-Tobacco     Alcohol drinks/day: <1     Alcohol type: very occas     Smoking Status: current     Smoking Cessation Counseling: yes     Packs/Day: 3 cig     Year Started: 2003     Passive Smoke Exposure: no  Caffeine-Diet-Exercise     Caffeine use/day: 1     Does Patient Exercise: no     Exercise Counseling: to improve exercise regimen  Hep-HIV-STD-Contraception     HIV Risk: no     Dental Visit-last 6 months yes     Dental Care Counseling: not indicated; dental care within six months     SBE monthly: yes     SBE Education/Counseling: not indicated; SBE done regularly  Safety-Violence-Falls     Seat Belt Use: 100  Current Medications (verified): 1)  Lisinopril-Hydrochlorothiazide 10-12.5 Mg Tabs (Lisinopril-Hydrochlorothiazide) .Marland Kitchen.. 1 By Mouth Once Daily. Needs Office Visit Before Additonal Refills. 2)  Vitamin D [address] Unit Caps (Ergocalciferol) .... Take 1 Tab By Mouth Weekly 3)  Zocor 40 Mg Tabs (Simvastatin) .... Take 1 Tab Once Daily At Bedtime  Allergies (verified): No Known Drug Allergies  Past History:  Past Medical History: Last updated: 08/18/2007 Hypertension Hyperlipidemia  Past Surgical History: Last updated: 02/25/2007 Hysterectomy (1994)  Family History: Last updated: 02/25/2007 Family History Breast cancer 1st degree relative <50 Family History of CAD Female 1st degree relative <50 Family History Diabetes 1st degree relative -- Sister  deceased DKA,  F Family History Hypertension  Social History: Last updated: 02/25/2007 Occupation:  [employer] office Single Current Smoker Alcohol use-yes Drug use-no Regular exercise-yes  Risk Factors: Alcohol Use: <1 (08/27/2009) Caffeine Use: 1 (08/27/2009) Exercise: no (08/27/2009)  Risk Factors: Smoking Status: current (08/27/2009) Packs/Day: 3 cig (08/27/2009) Passive Smoke Exposure: no (08/27/2009)  Family History: Reviewed history from 02/25/2007 and no changes required. Family History Breast cancer 1st degree relative <50 Family History of CAD Female 1st degree relative <50 Family History Diabetes 1st degree relative -- Sister deceased DKA,  F Family History Hypertension  Social History: Reviewed history from 02/25/2007 and no changes required. Occupation:  Forensic scientist Single Current Smoker Alcohol use-yes Drug use-no Regular exercise-yes Does Patient Exercise:  no Dental Care w/in 6 mos.:  yes  Review of Systems      See HPI General:  Denies chills, fatigue, fever, loss of appetite, malaise, sleep disorder, sweats, weakness, and weight loss. Eyes:  Denies blurring, discharge, double vision, eye irritation, eye pain, halos, itching, light sensitivity, red eye, vision loss-1 eye, and vision loss-both eyes; optho--due. ENT:  Denies decreased hearing, difficulty swallowing, ear discharge, earache, hoarseness, nasal congestion, nosebleeds, postnasal drainage, ringing in ears, sinus pressure, and sore throat. CV:  Denies bluish discoloration of lips or nails, chest pain or discomfort, difficulty breathing at night, difficulty breathing while lying down, fainting, fatigue, leg  cramps with exertion, lightheadness, near fainting, palpitations, shortness of breath with exertion, swelling of feet, swelling of hands, and weight gain. Resp:  Denies chest discomfort, chest pain with inspiration, cough, coughing up blood, excessive snoring, hypersomnolence, morning headaches,  pleuritic, shortness of breath, sputum productive, and wheezing. GI:  Denies abdominal pain, bloody stools, change in bowel habits, constipation, dark tarry stools, diarrhea, excessive appetite, gas, hemorrhoids, indigestion, loss of appetite, nausea, vomiting, vomiting blood, and yellowish skin color. GU:  Denies abnormal vaginal bleeding, decreased libido, discharge, dysuria, genital sores, hematuria, incontinence, nocturia, urinary frequency, and urinary hesitancy. MS:  Denies joint pain, joint redness, joint swelling, loss of strength, low back pain, mid back pain, muscle aches, muscle , cramps, muscle weakness, stiffness, and thoracic pain. Derm:  Denies changes in color of skin, changes in nail beds, dryness, excessive perspiration, flushing, hair loss, insect bite(s), itching, lesion(s), poor wound healing, and rash. Neuro:  Denies brief paralysis, difficulty with concentration, disturbances in coordination, falling down, headaches, inability to speak, memory loss, numbness, poor balance, seizures, sensation of room spinning, tingling, tremors, visual disturbances, and weakness. Psych:  Denies alternate hallucination ( auditory/visual), anxiety, depression, easily angered, easily tearful, irritability, mental problems, panic attacks, sense of great danger, suicidal thoughts/plans, thoughts of violence, unusual visions or sounds, and thoughts /plans of harming others. Endo:  Denies cold intolerance, excessive hunger, excessive thirst, excessive urination, heat intolerance, polyuria, and weight change. Heme:  Denies abnormal bruising, bleeding, enlarge lymph nodes, fevers, pallor, and skin discoloration. Allergy:  Complains of seasonal allergies.  Physical Exam  General:  Well-developed,well-nourished,in no acute distress; alert,appropriate and cooperative throughout examination Head:  Normocephalic and atraumatic without obvious abnormalities. No apparent alopecia or balding. Eyes:  vision  grossly intact, pupils equal, pupils round, pupils reactive to light, and no injection.   Ears:  External ear exam shows no significant lesions or deformities.  Otoscopic examination reveals clear canals, tympanic membranes are intact bilaterally without bulging, retraction, inflammation or discharge. Hearing is grossly normal bilaterally. Nose:  External nasal examination shows no deformity or inflammation. Nasal mucosa are pink and moist without lesions or exudates. Mouth:  Oral mucosa and oropharynx without lesions or exudates.  Teeth in good repair. Neck:  No deformities, masses, or tenderness noted.no carotid bruits.   Chest Wall:  No deformities, masses, or tenderness noted. Breasts:  gyn Lungs:  Normal respiratory effort, chest expands symmetrically. Lungs are clear to auscultation, no crackles or wheezes. Heart:  normal rate and no murmur.   Abdomen:  Bowel sounds positive,abdomen soft and non-tender without masses, organomegaly or hernias noted. Rectal:  gyn Genitalia:  gyn Msk:  normal ROM, no joint tenderness, no joint swelling, no joint warmth, no redness over joints, no joint deformities, no joint instability, and no crepitation.   Pulses:  R posterior tibial normal, R dorsalis pedis normal, R carotid normal, L posterior tibial normal, L dorsalis pedis normal, and L carotid normal.   Extremities:  No clubbing, cyanosis, edema, or deformity noted with normal full range of motion of all joints.   Neurologic:  No cranial nerve deficits noted. Station and gait are normal. Plantar reflexes are down-going bilaterally. DTRs are symmetrical throughout. Sensory, motor and coordinative functions appear intact. Skin:  Intact without suspicious lesions or rashes Cervical Nodes:  No lymphadenopathy noted Axillary Nodes:  No palpable lymphadenopathy Psych:  Oriented X3, normally interactive, good eye contact, not anxious appearing, and not depressed appearing.     Impression &  Recommendations:  Problem # 1:  PREVENTIVE HEALTH CARE (ICD-V70.0)  check fasting labs ghm utd pap / mammo/ bmd per gyn   Orders: EKG w/ Interpretation (93000)  Problem # 2:  HYPERLIPIDEMIA (ICD-272.4)  Her updated medication list for this problem includes:    Zocor 40 Mg Tabs (Simvastatin) .Marland Kitchen... Take 1 tab once daily at bedtime  Labs Reviewed: SGOT: 22 (02/06/2009)   SGPT: 24 (02/06/2009)   HDL:51.90 (02/06/2009), 53.3 (02/02/2008)  LDL:DEL (02/02/2008), DEL (02/25/2007)  Chol:275 (02/06/2009), 249 (02/02/2008)  Trig:90.0 (02/06/2009), 126 (02/02/2008)  Orders: EKG w/ Interpretation (93000)  Problem # 3:  HYPERTENSION (ICD-401.9)  Her updated medication list for this problem includes:    Lisinopril-hydrochlorothiazide 10-12.5 Mg Tabs (Lisinopril-hydrochlorothiazide) .Marland Kitchen... 1 by mouth once daily. needs office visit before additonal refills.  BP today: 122/90 Prior BP: 120/94 (11/26/2008)  Labs Reviewed: K+: 4.0 (02/06/2009) Creat: : 0.9 (02/06/2009)   Chol: 275 (02/06/2009)   HDL: 51.90 (02/06/2009)   LDL: DEL (02/02/2008)   TG: 90.0 (02/06/2009)  Orders: EKG w/ Interpretation (93000)  Complete Medication List: 1)  Lisinopril-hydrochlorothiazide 10-12.5 Mg Tabs (Lisinopril-hydrochlorothiazide) .Marland Kitchen.. 1 by mouth once daily. needs office visit before additonal refills. 2)  Vitamin D [address] Unit Caps (Ergocalciferol) .... Take 1 tab by mouth weekly 3)  Zocor 40 Mg Tabs (Simvastatin) .... Take 1 tab once daily at bedtime  Patient Instructions: 1)  fasting labs  V70.0  272.4   NMR ,  bmp, hep, cbcd, tsh Prescriptions: VITAMIN D 60454 UNIT CAPS (ERGOCALCIFEROL) take 1 tab by mouth weekly  #4 x 5   Entered and Authorized by:   Loreen Freud DO   Signed by:   Loreen Freud DO on 08/27/2009   Method used:   Electronically to        [facility]* (retail)       [address]       [place], [place]  [address]       Ph: [phone]       Fax: [phone]   RxID:    [id number] LISINOPRIL-HYDROCHLOROTHIAZIDE 10-12.5 MG TABS (LISINOPRIL-HYDROCHLOROTHIAZIDE) 1 by mouth once daily. NEEDS OFFICE VISIT BEFORE ADDITONAL REFILLS.  #30 x 5   Entered and Authorized by:   Loreen Freud DO   Signed by:   Loreen Freud DO on 08/27/2009   Method used:   Electronically to        [facility]* (retail)       [address]       [place], [place]  [address]       Ph: [phone]       Fax: [phone]   RxID:   [id number]    EKG  Procedure date:  08/27/2009  Findings:      Normal sinus rhythm with rate of:  64 bpm      Last Colonoscopy:  Normal-- per pt (02/23/2001 9:59:42 AM) Colonoscopy Result Date:  07/17/2009 Colonoscopy Result:  normal=-- [facility] Colonoscopy Next Due:  10 yr PAP Result Date:  05/22/2009 PAP Result:  normal PAP Next Due:  1 yr Last Mammogram:  Normal Bilateral (10/27/2006 10:00:12 AM) Mammogram Result Date:  04/24/2009 Mammogram Result:  normal Mammogram Next Due:  1 yr

## 2010-07-08 NOTE — Medication Information (Signed)
Summary: Possible Nonadherence with Lisinopril/BCBS  Possible Nonadherence with Lisinopril/BCBS   Imported By: Lanelle Bal 07/27/2009 08:18:05  _____________________________________________________________________  External Attachment:    Type:   Image     Comment:   External Document

## 2010-07-10 NOTE — Letter (Signed)
Summary: Immunization/Shot Record  [facility] [place]  [address] [place], [place] [address]   Phone: [phone]  Fax: [phone]     Immunization Record for: Carrie [name]  Vaccine 1 2 3 4 5 6  HepB Hepatitis B                    DTP Diphtheria, Tetanus, Pertussis                         HIB Haemophilus influenzae Type b                 ZHYQMVHQIO IPV Inactivated Poliovirus             MMR Measles, Mumps, Jeanella Craze NGEXBMWUXL KGMWNUUVOZ Varicella Varivax    DGUYQIHKVQ QVZDGLOVFI EPPIRJJOAC Pneumococcal           Hep A Hepatitis A   ZYSAYTKZSW FUXNATFTDD UKGURKYHCW CBJSEGBTDV        Tetanus Booster Date of Last: Historical 02/22/2002  Flu Shot Date of Last:  Pneumovax Date of Last:  Meningococcal Vaccine Given:       Other Vaccines HPV Vaccine/ Date of Last:    Vaccine/ Date of Last:    Vaccine/ Date of Last:     VOHYWVPXTG  GYIRSWNIOE  VOJJKKXFGH Rotavirus Vaccine/ Date of Last:    Vaccine/ Date of Last:    Vaccine/ Date of Last:     WEXHBZJIRC  [facility]  VELFYBOFBP Zostavax Vaccine/ Date of Last:     Marguerite Olea  ZWCHENIDPO  Marguerite Olea  EUMPNTIRWE  RXVQMGQQPY  Recommended Childhood and Adolescent Immunization Schedule [place]  2006 Vaccine Age Birth 1 mos 2 mos 4 mos 6 mos 12 mos 15 mos 18 mos 24 mos 4-6 yrs 11-12 yrs 13-14 yrs 15 yrs 16-18 yrs Hepatitis B1 HepB HepB HepB1  HepB  HepB Series Catch-Up Diphtheria, Tetanus, Pertussis2   DTaP DTaP DTaP   DTaP  DTaP Tdap  Tdap Catch-Up Haemophilus influenzae type b3   Hib Hib Hib3 Hib        Inactivated Poliovirus   IPV IPV  IPV   IPV     Measles, Mumps, Rubella4      MMR   MMR M MR MMR Catch-Up Varicella5       Varicella  Varicella  Catch-Up Meningo-coccal6           MCV4  MCV4 CatchUpV4           MPSV4 for High Risk Groups  C MCV4 for High Risk Groups Pneumo-coccal7   PCV PCV PCV PCV   PCV  Catch-Up PPV for High Risk Groups         PPV for High Risk Groups  Influenza8      Influenza (Yearly)  Influenza (Yearly) for High Risk Groups Hepatitis A9       HepA Series  This schedule indicates the recommended ages for routine administration of currently licensed childhood vaccines, as of May 08, 2004, for children through age 10 years. Any dose not administered at the recommended age should be administered at any subsequent visit when indicated and feasible. Indicates age groups that warrant special effort to administer those vaccines not previously administered. Additional vaccines may be licensed and recommended during the year. Licensed combination vaccines may be used whenever any components of the combination are indicated and other components of the vaccine are not contraindicated and if approved by the Food and Drug  Administration for that dose of the series. Providers should consult the respective ACIP statement for detailed recommendations. Clinically significant adverse events that follow immunization should be reported to the Vaccine Adverse Event Reporting System (VAERS). Guidance about how to obtain and complete a VAERS form is available at www.vaers.LAgents.no or by telephone, [phone].  The Childhood and Adolescent Immunization Schedule is approved by: Advisory Committee on Administrator http://www.wade.com/   American Academy of Pediatrics BridgeDigest.com.cy   [employer] Academy [employer].aafp.org

## 2010-07-10 NOTE — Assessment & Plan Note (Signed)
Summary: TB TEST/RH.........  Nurse Visit   Allergies: No Known Drug Allergies  Immunizations Administered:  PPD Skin Test:    Vaccine Type: PPD    Site: right forearm    Mfr: Sanofi Pasteur    Dose: 0.1 ml    Route: ID    Given by: Jeremy Johann CMA    Exp. Date: 12/13/2011    Lot #: [id number]  PPD Results    Date of reading: 06/27/2010    Results: < 5mm    Interpretation: negative  Orders Added: 1)  TB Skin Test [86580] 2)  Admin 1st Vaccine [45409]

## 2010-07-10 NOTE — Progress Notes (Signed)
Summary: Conditions incorrect  Phone Note Call from Patient Call back at Work Phone [phone] Call back at [phone]   Summary of Call: Patient left message on triage that some of the medical conditions in her hx are not correct. She notes stress and depression. Patient notes that she did talk with the doctor about her job but never any stress or depression. Please advise. Initial call taken by: Lucious Groves CMA,  June 27, 2010 2:35 PM  Follow-up for Phone Call        Patient called back to let us know that she was very upset that this information was put on the form. She said when she spoke with Dr. Laury Axon about being stressed at work nothing was said about her being depressed and she would like tha informaiton taken off of her form. She is going to fax the form to [place] to chnage the information and then we can fax it back to her ASAP @ [phone].  Follow-up by: Harold Barban,  June 27, 2010 2:41 PM  Additional Follow-up for Phone Call Additional follow up Details #1::        stress reaction is what is in chart  Additional Follow-up by: Loreen Freud DO,  June 27, 2010 3:51 PM    Additional Follow-up for Phone Call Additional follow up Details #2::    spoke with patient and she stated she was not on Celexa, stated it was given to her for Hot flashes, but it made her feel bad so she stopped taking it..... fixed error on form and faxed back to patient, she confrimed that she rcv'd it..... Almeta Monas CMA Duncan Dull)  June 27, 2010 4:01 PM

## 2010-07-10 NOTE — Assessment & Plan Note (Signed)
Summary: cough - congested/cbs   Vital Signs:  Patient profile:   50 year old female Weight:      181.2 pounds BMI:     30.50 O2 Sat:      99 % on Room air Temp:     97.8 degrees F oral Pulse rate:   85 / minute Pulse rhythm:   regular BP sitting:   126 / 80  (left arm) Cuff size:   large  Vitals Entered By: Almeta Monas CMA Duncan Dull) (May 27, 2010 1:29 PM)  O2 Flow:  Room air CC: x4 days c/o congestion, sinus pressure, sore throat and cough with discolored mucus, URI symptoms   History of Present Illness:       This is a 50 year old woman who presents with URI symptoms.  The symptoms began 4 days ago.  + B/L sinus pressure.  Pt using theraflu only.  The patient complains of nasal congestion, purulent nasal discharge, sore throat, productive cough, earache, and sick contacts.  The patient denies fever, low-grade fever (<100.5 degrees), fever of 100.5-103 degrees, fever of 103.1-104 degrees, fever to >104 degrees, stiff neck, dyspnea, wheezing, rash, vomiting, diarrhea, use of an antipyretic, and response to antipyretic.  The patient also reports headache.  The patient denies itchy watery eyes, itchy throat, sneezing, seasonal symptoms, response to antihistamine, muscle aches, and severe fatigue.  The patient denies the following risk factors for Strep sinusitis: unilateral facial pain, unilateral nasal discharge, poor response to decongestant, double sickening, tooth pain, Strep exposure, tender adenopathy, and absence of cough.    Problems Prior to Update: 1)  Back Pain  (ICD-724.5) 2)  Chest Pain Unspecified  (ICD-786.50) 3)  Fatigue  (ICD-780.79) 4)  Other Acute Reactions To Stress  (ICD-308.3) 5)  Hot Flashes  (ICD-627.2) 6)  Fatigue  (ICD-780.79) 7)  Myalgia  (ICD-729.1) 8)  Hyperlipidemia  (ICD-272.4) 9)  Preventive Health Care  (ICD-V70.0) 10)  Family History Diabetes 1st Degree Relative  (ICD-V18.0) 11)  Family History of Cad Female 1st Degree Relative <50   (ICD-V17.3) 12)  Family History Breast Cancer 1st Degree Relative <50  (ICD-V16.3) 13)  Reduction Mammoplasty, Hx of  (ICD-V15.9) 14)  Hypertension  (ICD-401.9) 15)  Chest Pain  (ICD-786.50)  Medications Prior to Update: 1)  Lisinopril-Hydrochlorothiazide 10-12.5 Mg Tabs (Lisinopril-Hydrochlorothiazide) .Marland Kitchen.. 1 By Mouth Once Daily. Needs Office Visit Before Additonal Refills. 2)  Vitamin D 04540 Unit Caps (Ergocalciferol) .... Take 1 Tab By Mouth Weekly 3)  Simcor 1000-40 Mg Xr24h-Tab (Niacin-Simvastatin) .Marland Kitchen.. 1 By Mouth At Bedtime 4)  Citalopram Hydrobromide 20 Mg Tabs (Citalopram Hydrobromide) .... Take One Tablet By Mouth Daily 5)  Flexeril 10 Mg Tabs (Cyclobenzaprine Hcl) .Marland Kitchen.. 1 By Mouth Three Times A Day As Needed 6)  Vicodin Es 7.5-750 Mg Tabs (Hydrocodone-Acetaminophen) .Marland Kitchen.. 1 By Mouth Every 6 Hours As Needed  Current Medications (verified): 1)  Lisinopril-Hydrochlorothiazide 10-12.5 Mg Tabs (Lisinopril-Hydrochlorothiazide) .Marland Kitchen.. 1 By Mouth Once Daily. Needs Office Visit Before Additonal Refills. 2)  Vitamin D 98119 Unit Caps (Ergocalciferol) .... Take 1 Tab By Mouth Weekly 3)  Simcor 1000-40 Mg Xr24h-Tab (Niacin-Simvastatin) .Marland Kitchen.. 1 By Mouth At Bedtime 4)  Citalopram Hydrobromide 20 Mg Tabs (Citalopram Hydrobromide) .... Take One Tablet By Mouth Daily 5)  Flexeril 10 Mg Tabs (Cyclobenzaprine Hcl) .Marland Kitchen.. 1 By Mouth Three Times A Day As Needed 6)  Vicodin Es 7.5-750 Mg Tabs (Hydrocodone-Acetaminophen) .Marland Kitchen.. 1 By Mouth Every 6 Hours As Needed  Allergies (verified): No Known Drug Allergies  Past History:  Past Medical History: Last updated: 03/26/2010 Hypertension Hyperlipidemia Current Problems:  OTHER ACUTE REACTIONS TO STRESS (ICD-308.3) HOT FLASHES (ICD-627.2) FATIGUE (ICD-780.79) MYALGIA (ICD-729.1) HYPERLIPIDEMIA (ICD-272.4) PREVENTIVE HEALTH CARE (ICD-V70.0) FAMILY HISTORY DIABETES 1ST DEGREE RELATIVE (ICD-V18.0) FAMILY HISTORY OF CAD FEMALE 1ST DEGREE RELATIVE <50  (ICD-V17.3) FAMILY HISTORY BREAST CANCER 1ST DEGREE RELATIVE <50 (ICD-V16.3) REDUCTION MAMMOPLASTY, HX OF (ICD-V15.9) HYPERTENSION (ICD-401.9) CHEST PAIN (ICD-786.50)  Past Surgical History: Last updated: 02/25/2007 Hysterectomy (1994)  Family History: Last updated: 02/25/2007 Family History Breast cancer 1st degree relative <50 Family History of CAD Female 1st degree relative <50 Family History Diabetes 1st degree relative -- Sister deceased DKA,  F Family History Hypertension  Social History: Last updated: 02/25/2007 Occupation:  Post office Single Current Smoker Alcohol use-yes Drug use-no Regular exercise-yes  Risk Factors: Alcohol Use: <1 (08/27/2009) Caffeine Use: 1 (08/27/2009) Exercise: no (08/27/2009)  Risk Factors: Smoking Status: current (08/27/2009) Packs/Day: 3 cig (08/27/2009) Passive Smoke Exposure: no (08/27/2009)  Family History: Reviewed history from 02/25/2007 and no changes required. Family History Breast cancer 1st degree relative <50 Family History of CAD Female 1st degree relative <50 Family History Diabetes 1st degree relative -- Sister deceased DKA,  F Family History Hypertension  Social History: Reviewed history from 02/25/2007 and no changes required. Occupation:  Forensic scientist Single Current Smoker Alcohol use-yes Drug use-no Regular exercise-yes  Review of Systems      See HPI  Physical Exam  General:  Well-developed,well-nourished,in no acute distress; alert,appropriate and cooperative throughout examination Ears:  External ear exam shows no significant lesions or deformities.  Otoscopic examination reveals clear canals, tympanic membranes are intact bilaterally without bulging, retraction, inflammation or discharge. Hearing is grossly normal bilaterally. Nose:  no external deformity, L frontal sinus tenderness, L maxillary sinus tenderness, R frontal sinus tenderness, and R maxillary sinus tenderness.   Mouth:  pharyngeal erythema  and postnasal drip.   Neck:  No deformities, masses, or tenderness noted. Lungs:  Normal respiratory effort, chest expands symmetrically. Lungs are clear to auscultation, no crackles or wheezes. Heart:  normal rate and no murmur.   Psych:  Oriented X3 and normally interactive.     Impression & Recommendations:  Problem # 1:  SINUSITIS - ACUTE-NOS (ICD-461.9)  Instructed on treatment. Call if symptoms persist or worsen.   Her updated medication list for this problem includes:    Augmentin 875-125 Mg Tabs (Amoxicillin-pot clavulanate) .Marland Kitchen... 1 by mouth two times a day    Flonase 50 Mcg/act Susp (Fluticasone propionate) .Marland Kitchen... 2 sprays each nostril once daily  Complete Medication List: 1)  Lisinopril-hydrochlorothiazide 10-12.5 Mg Tabs (Lisinopril-hydrochlorothiazide) .Marland Kitchen.. 1 by mouth once daily. needs office visit before additonal refills. 2)  Vitamin D 16109 Unit Caps (Ergocalciferol) .... Take 1 tab by mouth weekly 3)  Simcor 1000-40 Mg Xr24h-tab (Niacin-simvastatin) .Marland Kitchen.. 1 by mouth at bedtime 4)  Citalopram Hydrobromide 20 Mg Tabs (Citalopram hydrobromide) .... Take one tablet by mouth daily 5)  Flexeril 10 Mg Tabs (Cyclobenzaprine hcl) .Marland Kitchen.. 1 by mouth three times a day as needed 6)  Vicodin Es 7.5-750 Mg Tabs (Hydrocodone-acetaminophen) .Marland Kitchen.. 1 by mouth every 6 hours as needed 7)  Augmentin 875-125 Mg Tabs (Amoxicillin-pot clavulanate) .Marland Kitchen.. 1 by mouth two times a day 8)  Flonase 50 Mcg/act Susp (Fluticasone propionate) .... 2 sprays each nostril once daily  Patient Instructions: 1)  Acute sinusitis symptoms for less than 10 days are not helped by antibiotics. Use warm moist compresses, and over the counter decongestants( only as directed). Call if no improvement  in 5-7 days, sooner if increasing pain, fever, or new symptoms.  Prescriptions: FLONASE 50 MCG/ACT SUSP (FLUTICASONE PROPIONATE) 2 sprays each nostril once daily  #1 x 0   Entered and Authorized by:   Loreen Freud DO   Signed  by:   Loreen Freud DO on 05/27/2010   Method used:   Electronically to        [facility]* (retail)       [address]       [place], [place]  [id number]       Ph: [phone]       Fax: [phone]   RxID:   [id number] AUGMENTIN 875-125 MG TABS (AMOXICILLIN-POT CLAVULANATE) 1 by mouth two times a day  #20 x 0   Entered and Authorized by:   Loreen Freud DO   Signed by:   Loreen Freud DO on 05/27/2010   Method used:   Electronically to        [facility]* (retail)       [address]       [place], [place]  [address]       Ph: [phone]       Fax: [phone]   RxID:   [id number]    Orders Added: 1)  Est. Patient Level III [42706]

## 2010-07-10 NOTE — Letter (Signed)
Summary: Out of Work  [facility]  [address] [place], [place] [address]   Phone: [phone]  Fax: [phone]    May 27, 2010   Employee:  ANAHIT KLUMB    To Whom It May Concern:   For Medical reasons, please excuse the above named employee from work for the following dates:  Start:   05/27/2010  End:   05/29/2010  If you need additional information, please feel free to contact our office.         Sincerely,    Loreen Freud DO

## 2010-07-16 NOTE — Letter (Signed)
Summary: Medical Eval Form/[place] [employer]  Medical Eval Form/[place] [employer]   Imported By: Lanelle Bal 07/07/2010 13:06:38  _____________________________________________________________________  External Attachment:    Type:   Image     Comment:   External Document

## 2010-09-18 LAB — DIFFERENTIAL
Lymphocytes Relative: 28 % (ref 12–46)
Lymphs Abs: 2.3 10*3/uL (ref 0.7–4.0)
Monocytes Relative: 6 % (ref 3–12)
Neutro Abs: 5.3 10*3/uL (ref 1.7–7.7)
Neutrophils Relative %: 64 % (ref 43–77)

## 2010-09-18 LAB — CBC
RBC: 4.34 MIL/uL (ref 3.87–5.11)
WBC: 8.3 10*3/uL (ref 4.0–10.5)

## 2010-09-18 LAB — POCT CARDIAC MARKERS
CKMB, poc: 1.1 ng/mL (ref 1.0–8.0)
Myoglobin, poc: 128 ng/mL (ref 12–200)
Myoglobin, poc: 98.4 ng/mL (ref 12–200)

## 2010-09-18 LAB — BASIC METABOLIC PANEL
Calcium: 9.7 mg/dL (ref 8.4–10.5)
Creatinine, Ser: 1 mg/dL (ref 0.4–1.2)
GFR calc Af Amer: >60 mL/min (ref >60)

## 2010-09-22 ENCOUNTER — Encounter: Payer: Self-pay | Admitting: Family Medicine

## 2010-09-22 ENCOUNTER — Ambulatory Visit (INDEPENDENT_AMBULATORY_CARE_PROVIDER_SITE_OTHER): Payer: Federal, State, Local not specified - PPO | Admitting: Family Medicine

## 2010-09-22 VITALS — BP 118/80 | Temp 98.8°F | Wt 179.4 lb

## 2010-09-22 DIAGNOSIS — J029 Acute pharyngitis, unspecified: Secondary | ICD-10-CM

## 2010-09-22 DIAGNOSIS — J069 Acute upper respiratory infection, unspecified: Secondary | ICD-10-CM

## 2010-09-22 MED ORDER — AZELASTINE HCL 0.15 % NA SOLN: NASAL | Status: DC

## 2010-09-22 MED ORDER — CETIRIZINE HCL 5 MG PO TABS: 5.0000 mg | ORAL_TABLET | Freq: Every day | ORAL | Status: DC

## 2010-09-22 MED ORDER — FLUTICASONE PROPIONATE 50 MCG/ACT NA SUSP: 2.0000 | Freq: Every day | NASAL | Status: DC

## 2010-09-22 MED ORDER — PSEUDOEPH-CHLORPHEN-HYDROCOD 60-4-5 MG/5ML PO SOLN: 5.0000 mL | Freq: Four times a day (QID) | ORAL | Status: DC | PRN

## 2010-09-22 NOTE — Progress Notes (Signed)
  Subjective:     Carrie [name] is a 50 y.o. female who presents for evaluation of symptoms of a URI. Symptoms include low grade fever, non productive cough, post nasal drip and sore throat. Onset of symptoms was 1 week ago, and has been gradually worsening since that time. Treatment to date: antihistamines and cough suppressants.  The following portions of the patient's history were reviewed and updated as appropriate: allergies, current medications, past family history, past medical history, past social history, past surgical history and problem list.  Review of Systems Pertinent items are noted in HPI.   Objective:    BP 118/80  Temp(Src) 98.8 F (37.1 C) (Oral)  Wt 179 lb 6.4 oz (81.375 kg) General appearance: alert, cooperative, appears stated age and no distress Head: Normocephalic, without obvious abnormality, atraumatic Eyes: conjunctivae/corneas clear. PERRL, EOM's intact. Fundi benign. Ears: normal TM's and external ear canals both ears Nose: Nares normal. Septum midline. Mucosa normal. No drainage or sinus tenderness., no discharge Throat: abnormal findings: PND, errythema Neck: no adenopathy, no carotid bruit, no JVD, supple, symmetrical, trachea midline and thyroid not enlarged, symmetric, no tenderness/mass/nodules Lungs: clear to auscultation bilaterally Heart: regular rate and rhythm, S1, S2 normal, no murmur, click, rub or gallop Lymph nodes: Cervical, supraclavicular, and axillary nodes normal.   Assessment:    viral upper respiratory illness   Plan:    Discussed diagnosis and treatment of URI. Suggested symptomatic OTC remedies. Nasal saline spray for congestion. Nasal steroids per orders. Follow up as needed. Call in 7 days if symptoms aren't resolving. --- or sooner if symptoms worsen

## 2010-10-16 ENCOUNTER — Other Ambulatory Visit: Payer: Self-pay

## 2010-10-16 MED ORDER — NIACIN-SIMVASTATIN ER 1000-40 MG PO TB24: 1.0000 | ORAL_TABLET | Freq: Every day | ORAL | Status: DC

## 2010-10-24 NOTE — Op Note (Signed)
[facility]  Patient:    Carrie [name], Carrie [name] Visit Number: [id number] MRN: [id number]          Service Type: DSU Location: [facility] Attending Physician:  Gustavus Messing Dictated by:   Yaakov Guthrie. Shon Hough, M.D. Proc. Date: 07/04/01 Admit Date:  07/04/2001                             Operative Report  INDICATIONS FOR PROCEDURE: The patient is status post bilateral breast reduction in the past and has developed severe hypertrophic scarring causing increased pain, discomfort, burning, and itching.  She has had intensive conservative treatment including steroid applications to the area as well as steroid injections at least four times without improvement.  OPERATION/PROCEDURE: Scar excisions, bilateral breast areas, with resuscitation at the periareolar midline and inframammary fold regions, with application of Silicone gel patches postoperatively.  SURGEON: Yaakov Guthrie. Shon Hough, M.D.  ASSISTANT: Margaretha Sheffield, R.N.  ANESTHESIA: General.  DESCRIPTION OF PROCEDURE: The patient was taken to the operating room and placed on the operating room table in the supine position.  She was given adequate general anesthesia and intubated orally.  Prep was done to the chest and breast areas in routine fashion using Betadine soap and solution and walled off with sterile towels and drapes so as to make a sterile field.  A marking pen was used to outline the areas of excision.  This was done using the Bovie unit on cutting.  Next, the superior and inferior margins were freed and then reconstruction was done with 2-0 Vicryl subcutaneously x2 layers, then a running subcuticular stitch of 3-0 Vicryl and a 5-0 Vicryl around the keyhole area and midline.  The same procedure was carried out on both sides. Estimated blood loss was less than 100 cc.  Complications, none.  At the end of the procedure Steri-Strips and then Silicone gel patches were applied and secured with  one inch paper tape, sterile dressings, ABDs, Hypafix tape.  She withstood the procedure very well and was taken to recovery in excellent condition. Dictated by:   Yaakov Guthrie. Shon Hough, M.D. Attending Physician:  Gustavus Messing DD:  07/04/01 TD:  07/04/01 Job: [id number]

## 2010-12-26 ENCOUNTER — Encounter: Payer: Self-pay | Admitting: Family Medicine

## 2010-12-26 ENCOUNTER — Ambulatory Visit (INDEPENDENT_AMBULATORY_CARE_PROVIDER_SITE_OTHER): Payer: Federal, State, Local not specified - PPO | Admitting: Family Medicine

## 2010-12-26 DIAGNOSIS — I10 Essential (primary) hypertension: Secondary | ICD-10-CM

## 2010-12-26 DIAGNOSIS — E785 Hyperlipidemia, unspecified: Secondary | ICD-10-CM

## 2010-12-26 LAB — HEPATIC FUNCTION PANEL
Albumin: 4.3 g/dL (ref 3.5–5.2)
Alkaline Phosphatase: 67 U/L (ref 39–117)
Total Bilirubin: 0.6 mg/dL (ref 0.3–1.2)

## 2010-12-26 LAB — BASIC METABOLIC PANEL
Calcium: 9.5 mg/dL (ref 8.4–10.5)
GFR: 76.66 mL/min (ref >60.00)
Glucose, Bld: 85 mg/dL (ref 70–99)
Potassium: 4 mEq/L (ref 3.5–5.1)
Sodium: 139 mEq/L (ref 135–145)

## 2010-12-26 LAB — POCT URINALYSIS DIPSTICK
Blood, UA: NEGATIVE
Nitrite, UA: NEGATIVE
Protein, UA: NEGATIVE
Spec Grav, UA: 1.025
Urobilinogen, UA: 0.2

## 2010-12-26 LAB — LIPID PANEL
Cholesterol: 264 mg/dL — ABNORMAL HIGH (ref 0–200)
HDL: 59.7 mg/dL (ref >39.00)
Total CHOL/HDL Ratio: 4
VLDL: 18.6 mg/dL (ref 0.0–40.0)

## 2010-12-26 MED ORDER — LISINOPRIL-HYDROCHLOROTHIAZIDE 10-12.5 MG PO TABS
1.0000 | ORAL_TABLET | Freq: Every day | ORAL | Status: DC
Start: 2010-12-26 — End: 2012-01-11

## 2010-12-26 MED ORDER — NIACIN-SIMVASTATIN ER 1000-40 MG PO TB24: 1.0000 | ORAL_TABLET | Freq: Every day | ORAL | Status: DC

## 2010-12-26 NOTE — Progress Notes (Signed)
  Subjective:    Carrie [name] is a 50 y.o. female here for follow up of dyslipidemia. The patient does not use medications that may worsen dyslipidemias (corticosteroids, progestins, anabolic steroids, diuretics, beta-blockers, amiodarone, cyclosporine, olanzapine). The patient exercises never. The patient is not known to have coexisting coronary artery disease.   Cardiac Risk Factors Age > 45-female, > 55-female:  NO  Smoking:   YES  +1  Sig. family hx of CHD*:  YES  +1  Hypertension:   YES  +1  Diabetes:   NO  HDL < 35:   NO  HDL > 59:   YES  -1  Total: 2   *- Sig. family h/o CHD per NCEP = MI or sudden death at <55yo in  father or other 1st-degree female relative, or <65yo in mother or  other 1st-degree female relative  The following portions of the patient's history were reviewed and updated as appropriate: allergies, current medications, past family history, past medical history, past social history, past surgical history and problem list.  Review of Systems Pertinent items are noted in HPI.    Objective:    BP 120/76  Pulse 70  Temp(Src) 98.6 F (37 C) (Oral)  Wt 177 lb 12.5 oz (80.64 kg)  SpO2 99% General appearance: alert, cooperative, appears stated age and no distress Lungs: clear to auscultation bilaterally Heart: regular rate and rhythm, S1, S2 normal, no murmur, click, rub or gallop Extremities: extremities normal, atraumatic, no cyanosis or edema  Lab Review Lab Results  Component Value Date   CHOL 275* 02/06/2009   CHOL 249* 02/02/2008   CHOL 266* 02/25/2007   HDL 51.90 02/06/2009   HDL 53.3 02/02/2008   HDL 43.2 02/25/2007   LDLDIRECT 207.5 02/06/2009   LDLDIRECT 150.4 02/02/2008   LDLDIRECT 202.2 02/25/2007      Assessment:  1.  Dyslipidemia as detailed above with 2 CHD risk factors using NCEP scheme above.  Target levels for LDL are: < 100 mg/dl (CHD or "CHD risk equivalent" is present)  Explained to the patient the respective contributions of genetics,  diet, and exercise to lipid levels and the use of medication in severe cases which do not respond to lifestyle alteration. The patient's interest and motivation in making lifestyle changes seems good.  2. Htn--con't meds,  stable  Plan:    The following changes are planned for the next 6 months, at which time the patient will return for repeat fasting lipids:  1. Dietary changes: Increase soluble fiber Plant sterols 2grams per day (e.g. Benecol): yes Reduce saturated fat, "trans" monounsaturated fatty acids, and cholesterol 2. Exercise changes:  start an exercise program 3. Other treatment: Smoking cessation (pt only smoking 1 a day now--she knows she needs to stop) Treatment of hypertension (con't med) Weight reduction (start exercising and watch diet) 4. Lipid-lowering medications: controlled-release niacin 1000mg  every day simcor  (Recommended by NCEP after 3-6 mos of dietary therapy & lifestyle modification,  except if CHD is present or LDL well above 190.) 5. Hormone replacement therapy (patient is a postmenopausal  woman): no 6. Screening for secondary causes of dyslipidemias: None indicated 7. Lipid screening for relatives: na 8. Follow up: 6 months.  Note: The majority of the visit was spent in counseling on the pathophysiology and treatment of dyslipidemias. The total face-to-face time was in excess of 25 minutes.

## 2010-12-26 NOTE — Patient Instructions (Signed)
Hypertension (High Blood Pressure) As your heart beats, it forces blood through your arteries. This force is your blood pressure. If the pressure is too high, it is called hypertension (HTN) or high blood pressure. HTN is dangerous because you may have it and not know it. High blood pressure may mean that your heart has to work harder to pump blood. Your arteries may be narrow or stiff. The extra work puts you at risk for heart disease, stroke, and other problems.  Blood pressure consists of two numbers, a higher number over a lower, 110/72, for example. It is stated as "110 over 72." The ideal is below 120 for the top number (systolic) and under 80 for the bottom (diastolic). Write down your blood pressure today. You should pay close attention to your blood pressure if you have certain conditions such as:  Heart failure.  Prior heart attack.   Diabetes   Chronic kidney disease.   Prior stroke.   Multiple risk factors for heart disease.   To see if you have HTN, your blood pressure should be measured while you are seated with your arm held at the level of the heart. It should be measured at least twice. A one-time elevated blood pressure reading (especially in the Emergency Department) does not mean that you need treatment. There may be conditions in which the blood pressure is different between your right and left arms. It is important to see your caregiver soon for a recheck. Most people have essential hypertension which means that there is not a specific cause. This type of high blood pressure may be lowered by changing lifestyle factors such as:  Stress.  Smoking.   Lack of exercise.   Excessive weight.  Drug/tobacco/alcohol use.   Eating less salt.   Most people do not have symptoms from high blood pressure until it has caused damage to the body. Effective treatment can often prevent, delay or reduce that damage. TREATMENT Treatment for high blood pressure, when a cause has been  identified, is directed at the cause. There are a large number of medications to treat HTN. These fall into several categories, and your caregiver will help you select the medicines that are best for you. Medications may have side effects. You should review side effects with your caregiver. If your blood pressure stays high after you have made lifestyle changes or started on medicines,   Your medication(s) may need to be changed.   Other problems may need to be addressed.   Be certain you understand your prescriptions, and know how and when to take your medicine.   Be sure to follow up with your caregiver within the time frame advised (usually within two weeks) to have your blood pressure rechecked and to review your medications.   If you are taking more than one medicine to lower your blood pressure, make sure you know how and at what times they should be taken. Taking two medicines at the same time can result in blood pressure that is too low.  SEEK IMMEDIATE MEDICAL CARE IF YOU DEVELOP:  A severe headache, blurred or changing vision, or confusion.   Unusual weakness or numbness, or a faint feeling.   Severe chest or abdominal pain, vomiting, or breathing problems.  MAKE SURE YOU:   Understand these instructions.   Will watch your condition.   Will get help right away if you are not doing well or get worse.  Document Released: 05/25/2005 Document Re-Released: 11/12/2009 ExitCare Patient Information 2011 [employer],   LL[employer].  Cholesterol Cholesterol is a white, waxy, fat-like protein needed by your body in small amounts. The liver makes all the cholesterol you need. It is carried from the liver by the blood through the blood vessels. Deposits (plaque) may build up on blood vessel walls. This makes the arteries narrower and stiffer. Plaque increases the risk for heart attack and stroke. You cannot feel your cholesterol level even if it is very high. The only way to know is by a blood test  to check your lipid (fats) levels. Once you know your cholesterol levels, you should keep a record of the test results. Work with your caregiver to to keep your levels in the desired range. WHAT THE RESULTS MEAN:  Total cholesterol is a rough measure of all the cholesterol in your blood.   LDL is the so-called bad cholesterol. This is the type that deposits cholesterol   in the walls of the arteries. You want this level to be low.   HDL is the good cholesterol because it cleans the arteries and carries the LDL away. You want this level to be high.   Triglycerides are fat that the body can either burn for energy or store. High levels are closely linked to heart disease.  DESIRED LEVELS:  Total cholesterol below 200.   LDL below 100 for people at risk, below 70 for very high risk.   HDL above 50 is good, above 60 is best.   Triglycerides below 150.  HOW TO LOWER YOUR CHOLESTEROL:  Diet.   Choose fish or white meat chicken and turkey, roasted or baked. Limit fatty cuts of red meat, fried foods, and processed meats, such as sausage and lunch meat.   Eat lots of fresh fruits and vegetables. Choose whole grains, beans, pasta, potatoes and cereals.   Use only small amounts of olive, corn or canola oils. Avoid butter, mayonnaise, shortening or palm kernel oils. Avoid foods with trans-fats.   Use skim/nonfat milk and low-fat/nonfat yogurt and cheeses. Avoid whole milk, cream, ice cream, egg yolks and cheeses. Healthy desserts include angel food cake, gingersnaps, animal crackers, hard candy, popsicles, and low-fat/nonfat frozen yogurt. Avoid pastries, cakes, pies and cookies.   Exercise.   A regular program helps decrease LDL and raises HDL.   Helps with weight control.   Do things that increase your activity level like gardening, walking, or taking the stairs.   Medication.   May be prescribed by your caregiver to help lowering cholesterol and the risk for heart disease.   You  may need medicine even if your levels are normal if you have several risk factors.  HOME CARE INSTRUCTIONS  Follow your diet and exercise programs as suggested by your caregiver.   Take medications as directed.   Have blood work done when your caregiver feels it is necessary.  MAKE SURE YOU:   Understand these instructions.   Will watch your condition.   Will get help right away if you are not doing well or get worse.  Document Released: 02/17/2001 Document Re-Released: 05/07/2008 ExitCare Patient Information 2011 [employer]. 

## 2010-12-30 ENCOUNTER — Encounter: Payer: Self-pay | Admitting: *Deleted

## 2011-01-30 ENCOUNTER — Telehealth: Payer: Self-pay

## 2011-01-30 NOTE — Telephone Encounter (Signed)
Lmtcb on VM     KP

## 2011-01-30 NOTE — Telephone Encounter (Signed)
Call from patient and she stated she was having Chest pain since last night that is going down her left arm. She stated she is not having any other symptoms. Denied SOB. She stated she had a lot of stress. I advised patient she needs to go to the hospital, she stated she did not not want to go because of her insurance and would rather wait until it got worst. She stated she needed a follow up appt to discuss meds, I advised she did not to wait to address chest pain and she said she just wanted to follow up.. I advised patient again that she needed to go to the hospital and she declined, said she would go if she got worst and advised me to have a nice day.     KP

## 2011-01-30 NOTE — Telephone Encounter (Signed)
If it gets worse she may be dead ---she really needs to go.

## 2011-02-02 NOTE — Telephone Encounter (Signed)
mssg left on VM for a return call    KP 

## 2011-02-03 NOTE — Telephone Encounter (Signed)
Please call pt back.

## 2011-02-03 NOTE — Telephone Encounter (Signed)
Discussed with patient and she went to [facility]. EKG was done and they recommended that she another stress test done. Wants to let Dr.Lowne know so we can schedule, please advise on stress test   KP

## 2011-02-03 NOTE — Telephone Encounter (Signed)
Detailed VM left advising patient will discuss in appt, if any further issues to call back    KP

## 2011-02-03 NOTE — Telephone Encounter (Signed)
We will discuss when she comes in on Friday

## 2011-02-05 ENCOUNTER — Ambulatory Visit: Payer: Federal, State, Local not specified - PPO | Admitting: Family Medicine

## 2011-02-11 ENCOUNTER — Encounter: Payer: Self-pay | Admitting: Family Medicine

## 2011-02-11 ENCOUNTER — Ambulatory Visit (INDEPENDENT_AMBULATORY_CARE_PROVIDER_SITE_OTHER): Payer: Federal, State, Local not specified - PPO | Admitting: Family Medicine

## 2011-02-11 DIAGNOSIS — I10 Essential (primary) hypertension: Secondary | ICD-10-CM

## 2011-02-11 DIAGNOSIS — R079 Chest pain, unspecified: Secondary | ICD-10-CM
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
## 2011-02-11 NOTE — Patient Instructions (Signed)
Chest Pain (Nonspecific) It is often hard to give a specific diagnosis for the cause of chest pain. There is always a chance that your pain could be related to something serious, such as a heart attack or a blood clot in the lungs. You need to follow up with your caregiver for further evaluation. CAUSES  Heartburn.   Pneumonia or bronchitis.   Anxiety and stress.   Inflammation around your heart (pericarditis) or lung (pleuritis or pleurisy).   A blood clot in the lung.   A collapsed lung (pneumothorax). It can develop suddenly on its own (spontaneous pneumothorax) or from injury (trauma) to the chest.  The chest wall is composed of bones, muscles, and cartilage. Any of these can be the source of the pain.  The bones can be bruised by injury.   The muscles or cartilage can be strained by coughing or overwork.   The cartilage can be affected by inflammation and become sore (costochondritis).  DIAGNOSIS Lab tests or other studies, such as X-rays, an EKG, stress testing, or cardiac imaging, may be needed to find the cause of your pain.  TREATMENT  Treatment depends on what may be causing your chest pain. Treatment may include:   Acid blockers for heartburn.  Anti-inflammatory medicine.   Pain medicine for inflammatory conditions.  Antibiotics if an infection is present.    You may be advised to change lifestyle habits. This includes stopping smoking and avoiding caffeine and chocolate.   You may be advised to keep your head raised (elevated) when sleeping. This reduces the chance of acid going backward from your stomach into your esophagus.   Most of the time, nonspecific chest pain will improve within 2 to 3 days with rest and mild pain medicine.  HOME CARE INSTRUCTIONS  If antibiotics were prescribed, take the full amount even if you start to feel better.   For the next few days, avoid physical activities that bring on chest pain. Continue physical activities as directed.     Do not smoke cigarettes or drink alcohol until your symptoms are gone.   Only take over-the-counter or prescription medicine for pain, discomfort, or fever as directed by your caregiver.   Follow your caregiver's suggestions for further testing if your chest pain does not go away.   Keep any follow-up appointments you made. If you do not go to an appointment, you could develop lasting (chronic) problems with pain. If there is any problem keeping an appointment, you must call to reschedule.  SEEK MEDICAL CARE IF:  You think you are having problems from the medicine you are taking. Read your medicine instructions carefully.   Your chest pain does not go away, even after treatment.   You develop a rash with blisters on your chest.  SEEK IMMEDIATE MEDICAL CARE IF:  You have increased chest pain or pain that spreads to your arm, neck, jaw, back, or belly (abdomen).   You develop shortness of breath, an increasing cough, or you are coughing up blood.   You have severe back or abdominal pain, feel sick to your stomach (nauseous) or throw up (vomit).   You develop severe weakness, fainting, or chills.   You have an oral temperature above 100.4, not controlled by medicine.  THIS IS AN EMERGENCY. Do not wait to see if the pain will go away. Get medical help at once. Call your local emergency services 911 (911 in [place] Do not drive yourself to the hospital. MAKE SURE YOU:  Understand these  instructions.   Will watch your condition.   Will get help right away if you are not doing well or get worse.  Document Released: 03/04/2005 Document Re-Released: 08/19/2009 [facility] Patient Information 2011 [place], [place].

## 2011-03-02 ENCOUNTER — Ambulatory Visit (HOSPITAL_COMMUNITY): Payer: Federal, State, Local not specified - PPO | Attending: Family Medicine | Admitting: Radiology

## 2011-03-02 DIAGNOSIS — I10 Essential (primary) hypertension: Secondary | ICD-10-CM

## 2011-03-02 DIAGNOSIS — R079 Chest pain, unspecified: Secondary | ICD-10-CM | POA: Insufficient documentation

## 2011-03-02 MED ORDER — TECHNETIUM TC 99M TETROFOSMIN IV KIT
11.0000 | PACK | Freq: Once | INTRAVENOUS | Status: AC | PRN
  Administered 2011-03-02: 11 via INTRAVENOUS

## 2011-03-02 MED ORDER — TECHNETIUM TC 99M TETROFOSMIN IV KIT
33.0000 | PACK | Freq: Once | INTRAVENOUS | Status: AC | PRN
  Administered 2011-03-02: 33 via INTRAVENOUS

## 2011-03-02 NOTE — Progress Notes (Signed)
[facility] SITE 3 NUCLEAR MED [address] [place] [address] [phone]  Cardiology Nuclear Med Study  Carrie [name] is a 50 y.o. female [id number] [birth year]   Nuclear Med Background Indication for Stress Test:  Evaluation for Ischemia and [facility] with Chest Pain) History:  No previous documented CAD and Myocardial Perfusion Study Cardiac Risk Factors: Family History - CAD, Hypertension, Lipids and Smoker  Symptoms:  Chest Pain, DOE, Fatigue and SOB   Nuclear Pre-Procedure Caffeine/Decaff Intake:  None NPO After: 5:00 pm   Lungs:  clear IV 0.9% NS with Angio Cath:  20g  IV Site: R Hand  IV Started by:  Cathlyn Parsons, RN  Chest Size (in):  38 Cup Size: C  Height: 5\' 5"  (1.651 m)  Weight:  179 lb (81.194 kg)  BMI:  Body mass index is 29.79 kg/(m^2). Tech Comments:  NA    Nuclear Med Study 1 or 2 day study: 1 day  Stress Test Type:  Stress  Reading MD: Charlton Haws, MD  Order Authorizing Provider:  Y.Lowne  Resting Radionuclide: Technetium 90m Tetrofosmin  Resting Radionuclide Dose: 10.7 mCi   Stress Radionuclide:  Technetium 62m Tetrofosmin  Stress Radionuclide Dose: 33.0 mCi           Stress Protocol Rest HR: 64 Stress HR: 146  Rest BP: 113/82 Stress BP: 164/96  Exercise Time (min): 6:00 METS: 7.0   Predicted Max HR: 168 bpm % Max HR: 86.9 bpm Rate Pressure Product: 84132   Dose of Adenosine (mg):  n/a Dose of Lexiscan: n/a mg  Dose of Atropine (mg): n/a Dose of Dobutamine: n/a mcg/kg/min (at max HR)  Stress Test Technologist: Milana Na, EMT-P  Nuclear Technologist:  Domenic Polite, CNMT     Rest Procedure:  Myocardial perfusion imaging was performed at rest 45 minutes following the intravenous administration of Technetium 60m Tetrofosmin. Rest ECG: NSR  Stress Procedure:  The patient exercised for 6:00.  The patient stopped due to fatigue and denied any chest pain.  There were non specific ST-T wave  changes.  Technetium 41m Tetrofosmin was injected at peak exercise and myocardial perfusion imaging was performed after a brief delay. Stress ECG: No significant change from baseline ECG  QPS Raw Data Images:  Normal; no motion artifact; normal heart/lung ratio. Stress Images:  Normal homogeneous uptake in all areas of the myocardium. Rest Images:  Normal homogeneous uptake in all areas of the myocardium. Subtraction (SDS):  Normal Transient Ischemic Dilatation (Normal <1.22):  0.99 Lung/Heart Ratio (Normal <0.45):  0.32  Quantitative Gated Spect Images QGS EDV:  60 ml QGS ESV:  19 ml QGS cine images:  NL LV Function; NL Wall Motion QGS EF: 69%  Impression Exercise Capacity:  Fair exercise capacity. BP Response:  Normal blood pressure response. Clinical Symptoms:  No chest pain. ECG Impression:  No significant ST segment change suggestive of ischemia. Comparison with Prior Nuclear Study: No images to compare  Overall Impression:  Normal stress nuclear study.      Charlton Haws

## 2011-04-13 ENCOUNTER — Other Ambulatory Visit: Payer: Self-pay | Admitting: Obstetrics and Gynecology

## 2011-04-13 DIAGNOSIS — Z1231 Encounter for screening mammogram for malignant neoplasm of breast: Secondary | ICD-10-CM

## 2011-05-04 ENCOUNTER — Ambulatory Visit
Admission: RE | Admit: 2011-05-04 | Discharge: 2011-05-04 | Disposition: A | Discharge status: Home / Self Care | Payer: Federal, State, Local not specified - PPO | Source: Ambulatory Visit | Attending: Obstetrics and Gynecology | Admitting: Obstetrics and Gynecology

## 2011-05-04 DIAGNOSIS — Z1231 Encounter for screening mammogram for malignant neoplasm of breast: Secondary | ICD-10-CM

## 2011-05-05 IMAGING — MG MM DIGITAL SCREENING BILAT W/ CAD
3 series · 3 of 3 positions shown · non-contrast
Comparison: none

DG SCREEN MAMMOGRAM BILATERAL
Bilateral CC and MLO view(s) were taken.
Technologist: LILL-TONE

DIGITAL SCREENING MAMMOGRAM WITH CAD:
The breast tissue is almost entirely fatty.  No masses or malignant type calcifications are 
identified.  Compared with prior studies.
Images were processed with CAD.

[L CC]
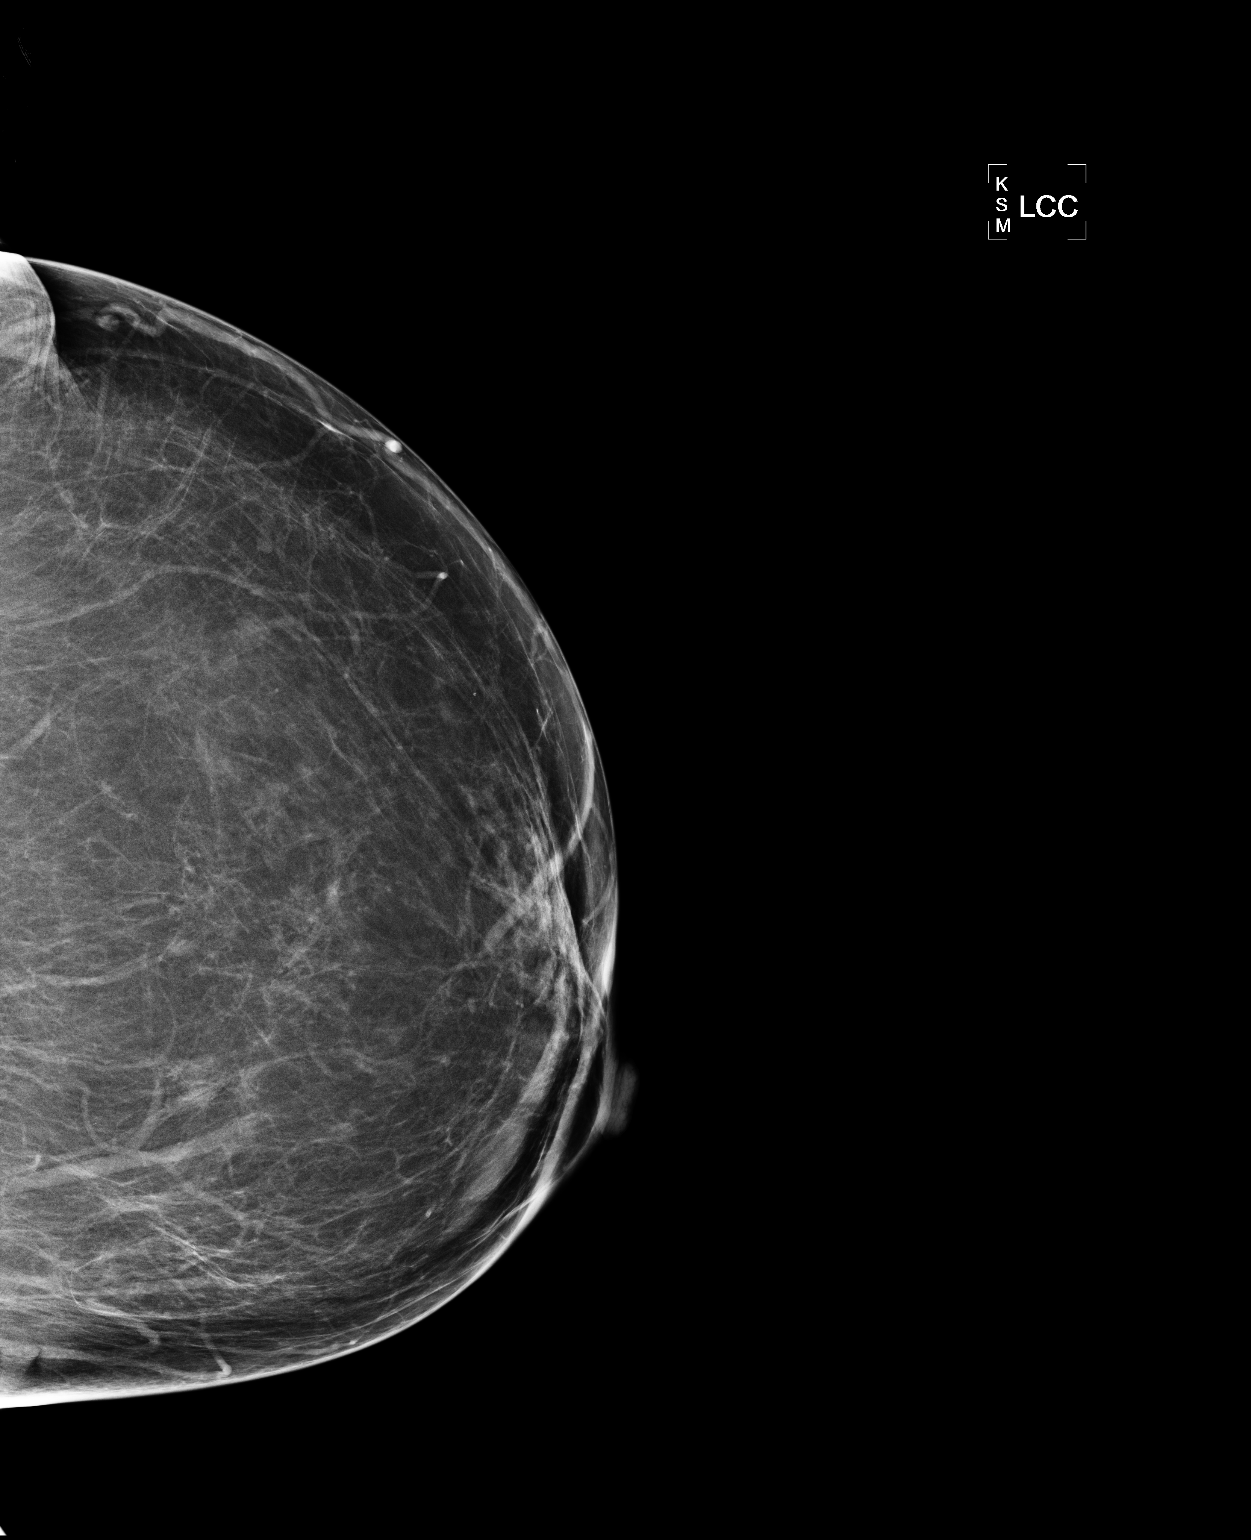

[L MLO]
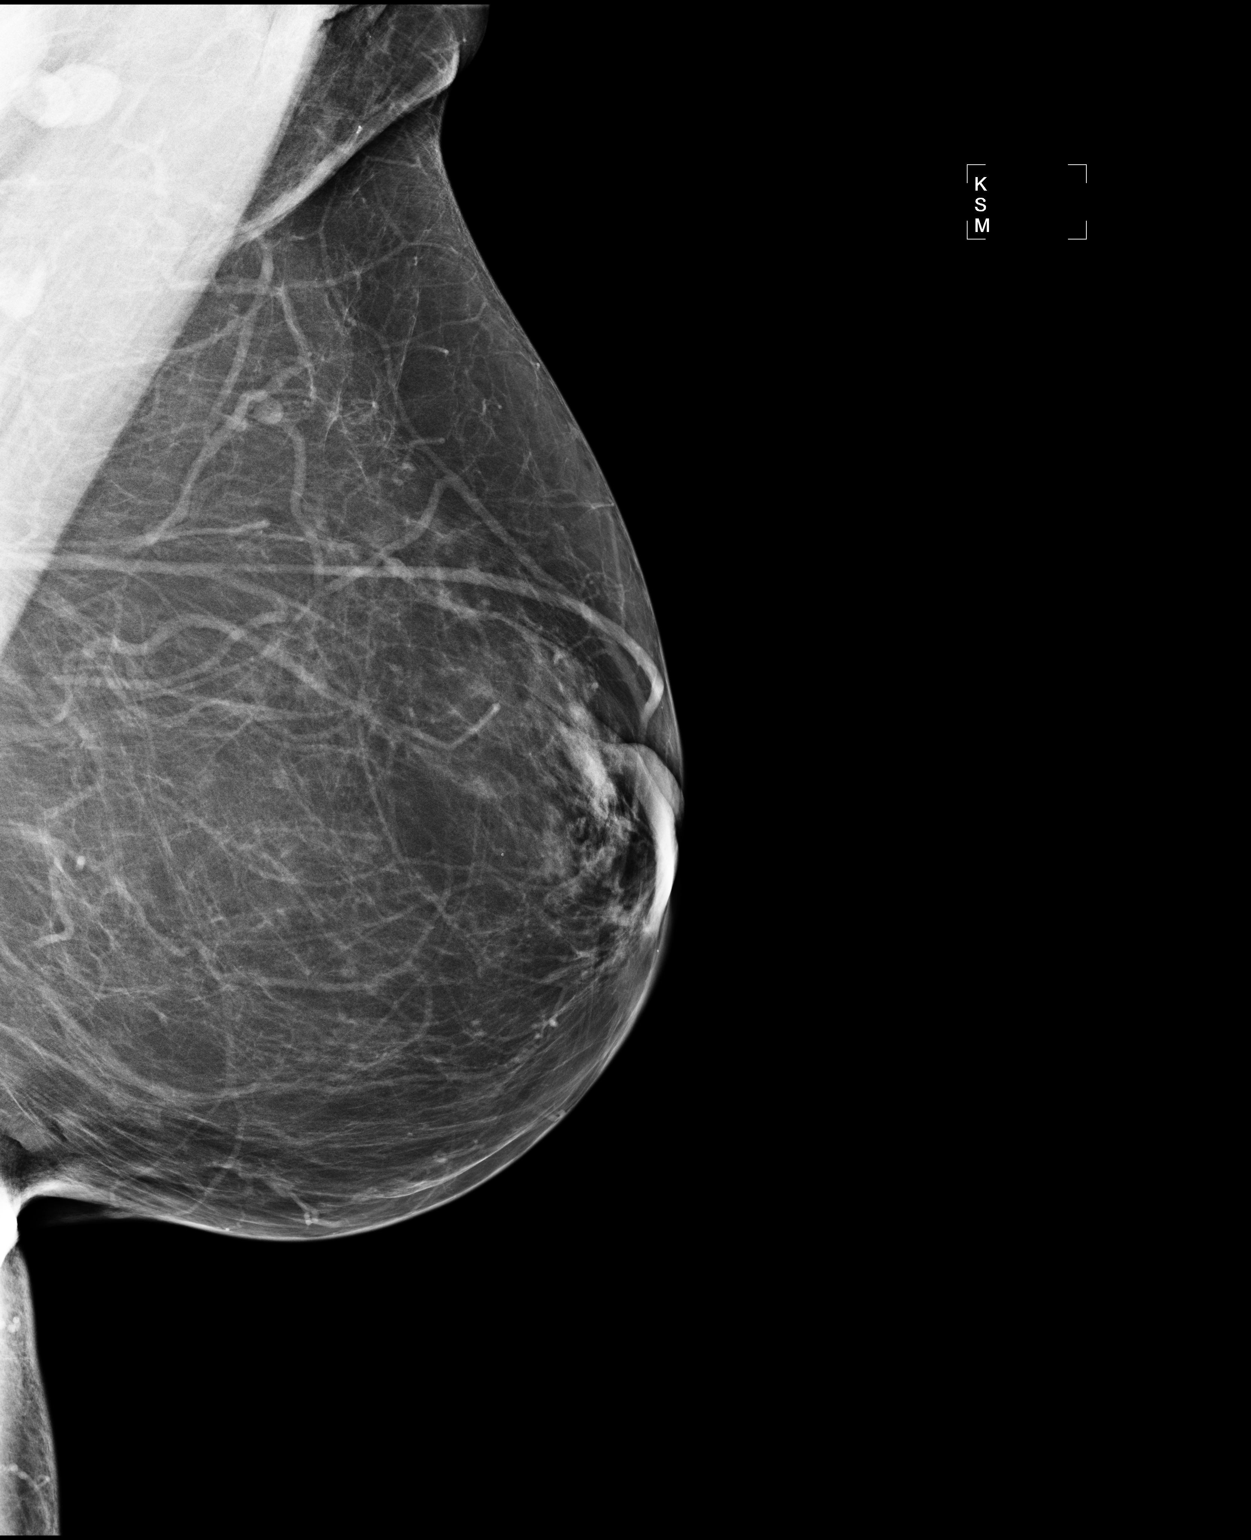

[R MLO]
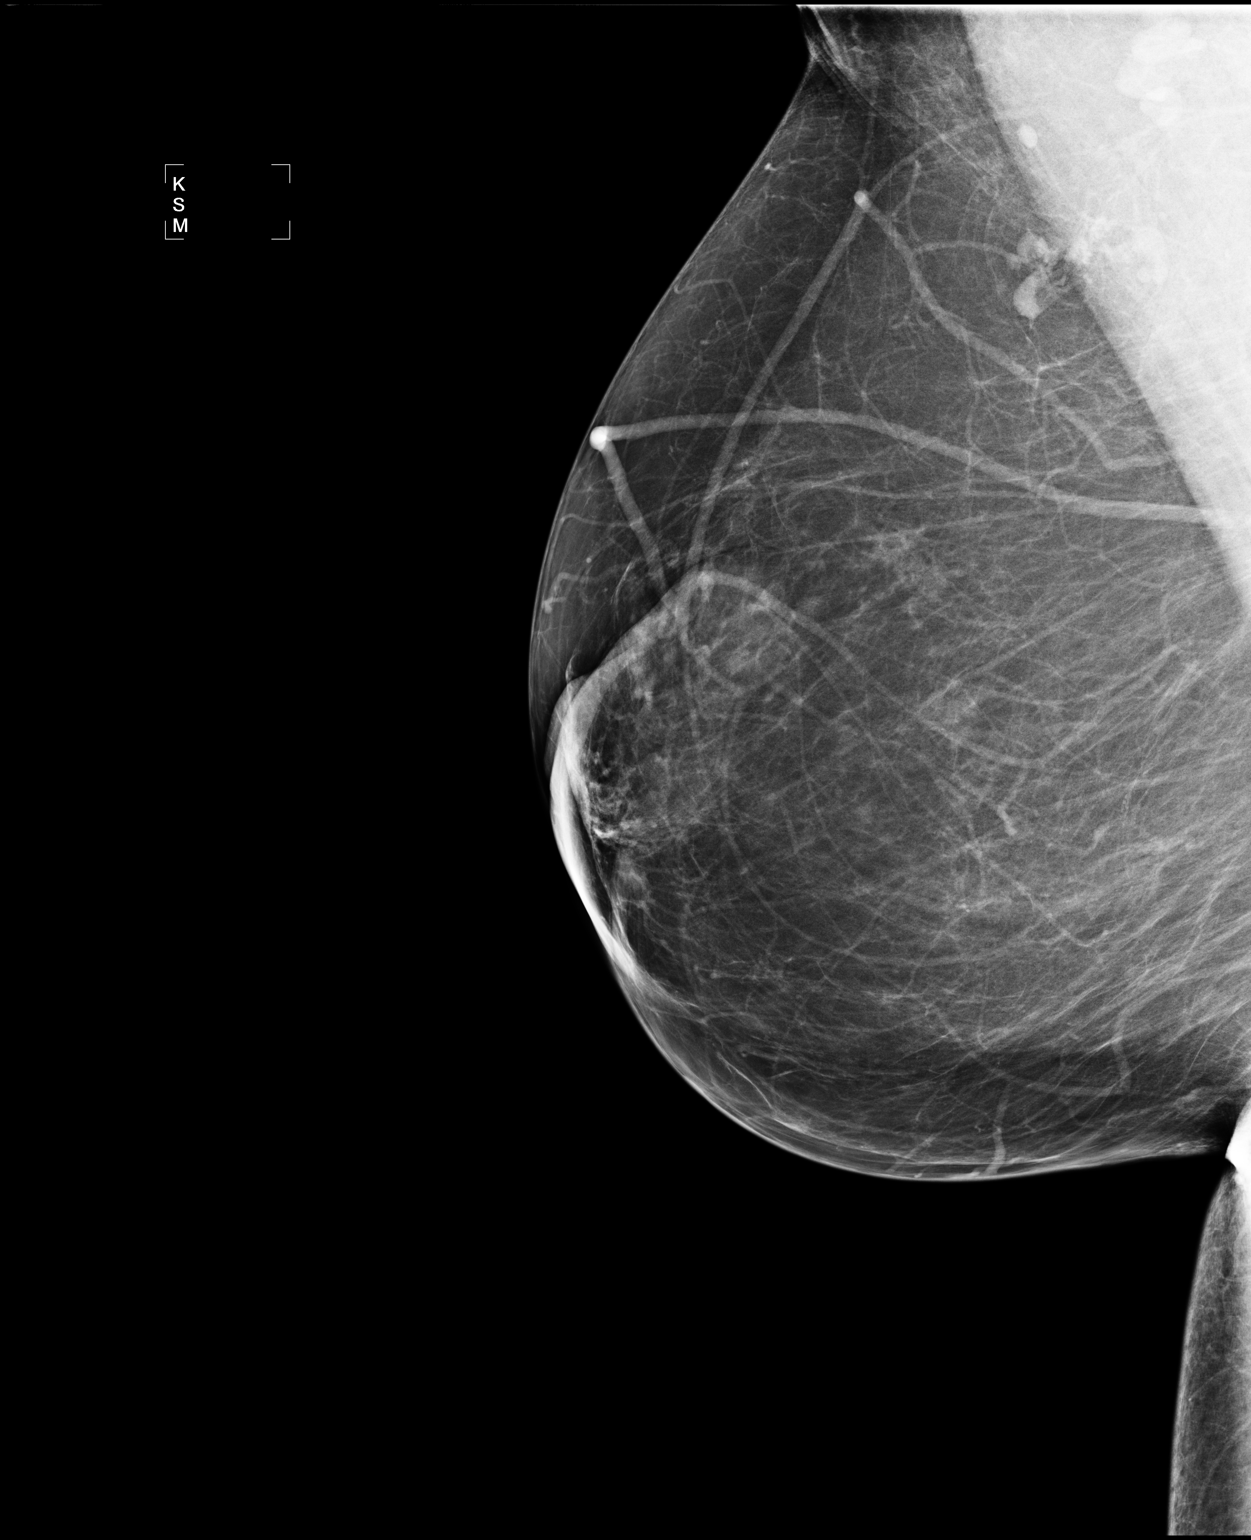

[3 of 3 positions shown; findings below may reference images not displayed]

IMPRESSION: No specific mammographic evidence of malignancy.  Next screening mammogram is recommended in one 
year.

A result letter of this screening mammogram will be mailed directly to the patient.

ASSESSMENT: Negative - BI-RADS 1

Screening mammogram in 1 year.
,

## 2011-05-13 ENCOUNTER — Ambulatory Visit: Payer: Federal, State, Local not specified - PPO | Admitting: Family Medicine

## 2011-05-13 DIAGNOSIS — Z0289 Encounter for other administrative examinations: Secondary | ICD-10-CM

## 2012-01-11 ENCOUNTER — Other Ambulatory Visit: Payer: Self-pay | Admitting: Family Medicine

## 2012-06-06 ENCOUNTER — Other Ambulatory Visit: Payer: Self-pay | Admitting: Obstetrics and Gynecology

## 2012-06-06 DIAGNOSIS — Z1231 Encounter for screening mammogram for malignant neoplasm of breast: Secondary | ICD-10-CM

## 2012-07-05 ENCOUNTER — Ambulatory Visit: Payer: Federal, State, Local not specified - PPO

## 2012-07-09 ENCOUNTER — Other Ambulatory Visit: Payer: Self-pay | Admitting: Family Medicine

## 2012-07-26 ENCOUNTER — Ambulatory Visit: Payer: Federal, State, Local not specified - PPO

## 2012-08-12 ENCOUNTER — Encounter: Payer: Federal, State, Local not specified - PPO | Admitting: Family Medicine

## 2012-08-15 ENCOUNTER — Ambulatory Visit: Payer: Federal, State, Local not specified - PPO

## 2012-08-29 ENCOUNTER — Ambulatory Visit (INDEPENDENT_AMBULATORY_CARE_PROVIDER_SITE_OTHER): Payer: Federal, State, Local not specified - PPO | Admitting: Family Medicine

## 2012-08-29 ENCOUNTER — Encounter: Payer: Self-pay | Admitting: Family Medicine

## 2012-08-29 VITALS — BP 138/98 | HR 111 | Temp 98.0°F | Ht 65.0 in | Wt 180.0 lb

## 2012-08-29 DIAGNOSIS — M549 Dorsalgia, unspecified: Secondary | ICD-10-CM

## 2012-08-29 DIAGNOSIS — I10 Essential (primary) hypertension: Secondary | ICD-10-CM

## 2012-08-29 DIAGNOSIS — Z0000 Encounter for general adult medical examination without abnormal findings: Secondary | ICD-10-CM

## 2012-08-29 DIAGNOSIS — E785 Hyperlipidemia, unspecified: Secondary | ICD-10-CM

## 2012-08-29 MED ORDER — LISINOPRIL-HYDROCHLOROTHIAZIDE 10-12.5 MG PO TABS: 2.0000 | ORAL_TABLET | Freq: Every day | ORAL | Status: DC

## 2012-08-29 MED ORDER — TRAMADOL HCL 50 MG PO TABS: 50.0000 mg | ORAL_TABLET | Freq: Three times a day (TID) | ORAL | Status: DC | PRN

## 2012-08-29 NOTE — Patient Instructions (Signed)
Preventive Care for Adults, Female A healthy lifestyle and preventive care can promote health and wellness. Preventive health guidelines for women include the following key practices.  A routine yearly physical is a good way to check with your caregiver about your health and preventive screening. It is a chance to share any concerns and updates on your health, and to receive a thorough exam.  Visit your dentist for a routine exam and preventive care every 6 months. Brush your teeth twice a day and floss once a day. Good oral hygiene prevents tooth decay and gum disease.  The frequency of eye exams is based on your age, health, family medical history, use of contact lenses, and other factors. Follow your caregiver's recommendations for frequency of eye exams.  Eat a healthy diet. Foods like vegetables, fruits, whole grains, low-fat dairy products, and lean protein foods contain the nutrients you need without too many calories. Decrease your intake of foods high in solid fats, added sugars, and salt. Eat the right amount of calories for you.Get information about a proper diet from your caregiver, if necessary.  Regular physical exercise is one of the most important things you can do for your health. Most adults should get at least 150 minutes of moderate-intensity exercise (any activity that increases your heart rate and causes you to sweat) each week. In addition, most adults need muscle-strengthening exercises on 2 or more days a week.  Maintain a healthy weight. The body mass index (BMI) is a screening tool to identify possible weight problems. It provides an estimate of body fat based on height and weight. Your caregiver can help determine your BMI, and can help you achieve or maintain a healthy weight.For adults 20 years and older:  A BMI below 18.5 is considered underweight.  A BMI of 18.5 to 24.9 is normal.  A BMI of 25 to 29.9 is considered overweight.  A BMI of 30 and above is  considered obese.  Maintain normal blood lipids and cholesterol levels by exercising and minimizing your intake of saturated fat. Eat a balanced diet with plenty of fruit and vegetables. Blood tests for lipids and cholesterol should begin at age 20 and be repeated every 5 years. If your lipid or cholesterol levels are high, you are over 50, or you are at high risk for heart disease, you may need your cholesterol levels checked more frequently.Ongoing high lipid and cholesterol levels should be treated with medicines if diet and exercise are not effective.  If you smoke, find out from your caregiver how to quit. If you do not use tobacco, do not start.  If you are pregnant, do not drink alcohol. If you are breastfeeding, be very cautious about drinking alcohol. If you are not pregnant and choose to drink alcohol, do not exceed 1 drink per day. One drink is considered to be 12 ounces (355 mL) of beer, 5 ounces (148 mL) of wine, or 1.5 ounces (44 mL) of liquor.  Avoid use of street drugs. Do not share needles with anyone. Ask for help if you need support or instructions about stopping the use of drugs.  High blood pressure causes heart disease and increases the risk of stroke. Your blood pressure should be checked at least every 1 to 2 years. Ongoing high blood pressure should be treated with medicines if weight loss and exercise are not effective.  If you are 55 to 54 years old, ask your caregiver if you should take aspirin to prevent strokes.  Diabetes   screening involves taking a blood sample to check your fasting blood sugar level. This should be done once every 3 years, after age 45, if you are within normal weight and without risk factors for diabetes. Testing should be considered at a younger age or be carried out more frequently if you are overweight and have at least 1 risk factor for diabetes.  Breast cancer screening is essential preventive care for women. You should practice "breast  self-awareness." This means understanding the normal appearance and feel of your breasts and may include breast self-examination. Any changes detected, no matter how small, should be reported to a caregiver. Women in their 20s and 30s should have a clinical breast exam (CBE) by a caregiver as part of a regular health exam every 1 to 3 years. After age 40, women should have a CBE every year. Starting at age 40, women should consider having a mammography (breast X-ray test) every year. Women who have a family history of breast cancer should talk to their caregiver about genetic screening. Women at a high risk of breast cancer should talk to their caregivers about having magnetic resonance imaging (MRI) and a mammography every year.  The Pap test is a screening test for cervical cancer. A Pap test can show cell changes on the cervix that might become cervical cancer if left untreated. A Pap test is a procedure in which cells are obtained and examined from the lower end of the uterus (cervix).  Women should have a Pap test starting at age 21.  Between ages 21 and 29, Pap tests should be repeated every 2 years.  Beginning at age 30, you should have a Pap test every 3 years as long as the past 3 Pap tests have been normal.  Some women have medical problems that increase the chance of getting cervical cancer. Talk to your caregiver about these problems. It is especially important to talk to your caregiver if a new problem develops soon after your last Pap test. In these cases, your caregiver may recommend more frequent screening and Pap tests.  The above recommendations are the same for women who have or have not gotten the vaccine for human papillomavirus (HPV).  If you had a hysterectomy for a problem that was not cancer or a condition that could lead to cancer, then you no longer need Pap tests. Even if you no longer need a Pap test, a regular exam is a good idea to make sure no other problems are  starting.  If you are between ages 65 and 70, and you have had normal Pap tests going back 10 years, you no longer need Pap tests. Even if you no longer need a Pap test, a regular exam is a good idea to make sure no other problems are starting.  If you have had past treatment for cervical cancer or a condition that could lead to cancer, you need Pap tests and screening for cancer for at least 20 years after your treatment.  If Pap tests have been discontinued, risk factors (such as a new sexual partner) need to be reassessed to determine if screening should be resumed.  The HPV test is an additional test that may be used for cervical cancer screening. The HPV test looks for the virus that can cause the cell changes on the cervix. The cells collected during the Pap test can be tested for HPV. The HPV test could be used to screen women aged 30 years and older, and should   be used in women of any age who have unclear Pap test results. After the age of 30, women should have HPV testing at the same frequency as a Pap test.  Colorectal cancer can be detected and often prevented. Most routine colorectal cancer screening begins at the age of 50 and continues through age 75. However, your caregiver may recommend screening at an earlier age if you have risk factors for colon cancer. On a yearly basis, your caregiver may provide home test kits to check for hidden blood in the stool. Use of a small camera at the end of a tube, to directly examine the colon (sigmoidoscopy or colonoscopy), can detect the earliest forms of colorectal cancer. Talk to your caregiver about this at age 50, when routine screening begins. Direct examination of the colon should be repeated every 5 to 10 years through age 75, unless early forms of pre-cancerous polyps or small growths are found.  Hepatitis C blood testing is recommended for all people born from 1945 through 1965 and any individual with known risks for hepatitis C.  Practice  safe sex. Use condoms and avoid high-risk sexual practices to reduce the spread of sexually transmitted infections (STIs). STIs include gonorrhea, chlamydia, syphilis, trichomonas, herpes, HPV, and human immunodeficiency virus (HIV). Herpes, HIV, and HPV are viral illnesses that have no cure. They can result in disability, cancer, and death. Sexually active women aged 25 and younger should be checked for chlamydia. Older women with new or multiple partners should also be tested for chlamydia. Testing for other STIs is recommended if you are sexually active and at increased risk.  Osteoporosis is a disease in which the bones lose minerals and strength with aging. This can result in serious bone fractures. The risk of osteoporosis can be identified using a bone density scan. Women ages 65 and over and women at risk for fractures or osteoporosis should discuss screening with their caregivers. Ask your caregiver whether you should take a calcium supplement or vitamin D to reduce the rate of osteoporosis.  Menopause can be associated with physical symptoms and risks. Hormone replacement therapy is available to decrease symptoms and risks. You should talk to your caregiver about whether hormone replacement therapy is right for you.  Use sunscreen with sun protection factor (SPF) of 30 or more. Apply sunscreen liberally and repeatedly throughout the day. You should seek shade when your shadow is shorter than you. Protect yourself by wearing long sleeves, pants, a wide-brimmed hat, and sunglasses year round, whenever you are outdoors.  Once a month, do a whole body skin exam, using a mirror to look at the skin on your back. Notify your caregiver of new moles, moles that have irregular borders, moles that are larger than a pencil eraser, or moles that have changed in shape or color.  Stay current with required immunizations.  Influenza. You need a dose every fall (or winter). The composition of the flu vaccine  changes each year, so being vaccinated once is not enough.  Pneumococcal polysaccharide. You need 1 to 2 doses if you smoke cigarettes or if you have certain chronic medical conditions. You need 1 dose at age 65 (or older) if you have never been vaccinated.  Tetanus, diphtheria, pertussis (Tdap, Td). Get 1 dose of Tdap vaccine if you are younger than age 65, are over 65 and have contact with an infant, are a healthcare worker, are pregnant, or simply want to be protected from whooping cough. After that, you need a Td   booster dose every 10 years. Consult your caregiver if you have not had at least 3 tetanus and diphtheria-containing shots sometime in your life or have a deep or dirty wound.  HPV. You need this vaccine if you are a woman age 26 or younger. The vaccine is given in 3 doses over 6 months.  Measles, mumps, rubella (MMR). You need at least 1 dose of MMR if you were born in 1957 or later. You may also need a second dose.  Meningococcal. If you are age 19 to 21 and a first-year college student living in a residence hall, or have one of several medical conditions, you need to get vaccinated against meningococcal disease. You may also need additional booster doses.  Zoster (shingles). If you are age 60 or older, you should get this vaccine.  Varicella (chickenpox). If you have never had chickenpox or you were vaccinated but received only 1 dose, talk to your caregiver to find out if you need this vaccine.  Hepatitis A. You need this vaccine if you have a specific risk factor for hepatitis A virus infection or you simply wish to be protected from this disease. The vaccine is usually given as 2 doses, 6 to 18 months apart.  Hepatitis B. You need this vaccine if you have a specific risk factor for hepatitis B virus infection or you simply wish to be protected from this disease. The vaccine is given in 3 doses, usually over 6 months. Preventive Services / Frequency Ages 19 to 39  Blood  pressure check.** / Every 1 to 2 years.  Lipid and cholesterol check.** / Every 5 years beginning at age 20.  Clinical breast exam.** / Every 3 years for women in their 20s and 30s.  Pap test.** / Every 2 years from ages 21 through 29. Every 3 years starting at age 30 through age 65 or 70 with a history of 3 consecutive normal Pap tests.  HPV screening.** / Every 3 years from ages 30 through ages 65 to 70 with a history of 3 consecutive normal Pap tests.  Hepatitis C blood test.** / For any individual with known risks for hepatitis C.  Skin self-exam. / Monthly.  Influenza immunization.** / Every year.  Pneumococcal polysaccharide immunization.** / 1 to 2 doses if you smoke cigarettes or if you have certain chronic medical conditions.  Tetanus, diphtheria, pertussis (Tdap, Td) immunization. / A one-time dose of Tdap vaccine. After that, you need a Td booster dose every 10 years.  HPV immunization. / 3 doses over 6 months, if you are 26 and younger.  Measles, mumps, rubella (MMR) immunization. / You need at least 1 dose of MMR if you were born in 1957 or later. You may also need a second dose.  Meningococcal immunization. / 1 dose if you are age 19 to 21 and a first-year college student living in a residence hall, or have one of several medical conditions, you need to get vaccinated against meningococcal disease. You may also need additional booster doses.  Varicella immunization.** / Consult your caregiver.  Hepatitis A immunization.** / Consult your caregiver. 2 doses, 6 to 18 months apart.  Hepatitis B immunization.** / Consult your caregiver. 3 doses usually over 6 months. Ages 40 to 64  Blood pressure check.** / Every 1 to 2 years.  Lipid and cholesterol check.** / Every 5 years beginning at age 20.  Clinical breast exam.** / Every year after age 40.  Mammogram.** / Every year beginning at age 40   and continuing for as long as you are in good health. Consult with your  caregiver.  Pap test.** / Every 3 years starting at age 30 through age 65 or 70 with a history of 3 consecutive normal Pap tests.  HPV screening.** / Every 3 years from ages 30 through ages 65 to 70 with a history of 3 consecutive normal Pap tests.  Fecal occult blood test (FOBT) of stool. / Every year beginning at age 50 and continuing until age 75. You may not need to do this test if you get a colonoscopy every 10 years.  Flexible sigmoidoscopy or colonoscopy.** / Every 5 years for a flexible sigmoidoscopy or every 10 years for a colonoscopy beginning at age 50 and continuing until age 75.  Hepatitis C blood test.** / For all people born from 1945 through 1965 and any individual with known risks for hepatitis C.  Skin self-exam. / Monthly.  Influenza immunization.** / Every year.  Pneumococcal polysaccharide immunization.** / 1 to 2 doses if you smoke cigarettes or if you have certain chronic medical conditions.  Tetanus, diphtheria, pertussis (Tdap, Td) immunization.** / A one-time dose of Tdap vaccine. After that, you need a Td booster dose every 10 years.  Measles, mumps, rubella (MMR) immunization. / You need at least 1 dose of MMR if you were born in 1957 or later. You may also need a second dose.  Varicella immunization.** / Consult your caregiver.  Meningococcal immunization.** / Consult your caregiver.  Hepatitis A immunization.** / Consult your caregiver. 2 doses, 6 to 18 months apart.  Hepatitis B immunization.** / Consult your caregiver. 3 doses, usually over 6 months. Ages 65 and over  Blood pressure check.** / Every 1 to 2 years.  Lipid and cholesterol check.** / Every 5 years beginning at age 20.  Clinical breast exam.** / Every year after age 40.  Mammogram.** / Every year beginning at age 40 and continuing for as long as you are in good health. Consult with your caregiver.  Pap test.** / Every 3 years starting at age 30 through age 65 or 70 with a 3  consecutive normal Pap tests. Testing can be stopped between 65 and 70 with 3 consecutive normal Pap tests and no abnormal Pap or HPV tests in the past 10 years.  HPV screening.** / Every 3 years from ages 30 through ages 65 or 70 with a history of 3 consecutive normal Pap tests. Testing can be stopped between 65 and 70 with 3 consecutive normal Pap tests and no abnormal Pap or HPV tests in the past 10 years.  Fecal occult blood test (FOBT) of stool. / Every year beginning at age 50 and continuing until age 75. You may not need to do this test if you get a colonoscopy every 10 years.  Flexible sigmoidoscopy or colonoscopy.** / Every 5 years for a flexible sigmoidoscopy or every 10 years for a colonoscopy beginning at age 50 and continuing until age 75.  Hepatitis C blood test.** / For all people born from 1945 through 1965 and any individual with known risks for hepatitis C.  Osteoporosis screening.** / A one-time screening for women ages 65 and over and women at risk for fractures or osteoporosis.  Skin self-exam. / Monthly.  Influenza immunization.** / Every year.  Pneumococcal polysaccharide immunization.** / 1 dose at age 65 (or older) if you have never been vaccinated.  Tetanus, diphtheria, pertussis (Tdap, Td) immunization. / A one-time dose of Tdap vaccine if you are over   65 and have contact with an infant, are a healthcare worker, or simply want to be protected from whooping cough. After that, you need a Td booster dose every 10 years.  Varicella immunization.** / Consult your caregiver.  Meningococcal immunization.** / Consult your caregiver.  Hepatitis A immunization.** / Consult your caregiver. 2 doses, 6 to 18 months apart.  Hepatitis B immunization.** / Check with your caregiver. 3 doses, usually over 6 months. ** Family history and personal history of risk and conditions may change your caregiver's recommendations. Document Released: 07/21/2001 Document Revised: 08/17/2011  Document Reviewed: 10/20/2010 [employer] Patient Information 2013 [employer].  

## 2012-08-29 NOTE — Assessment & Plan Note (Signed)
Stable con't meds 

## 2012-08-29 NOTE — Assessment & Plan Note (Signed)
Check labs 

## 2012-08-29 NOTE — Progress Notes (Signed)
Subjective:     Carrie [name] is a 52 y.o. female and is here for a comprehensive physical exam. The patient reports no problems.  History   Social History  . Marital Status: Single    Spouse Name: N/A    Number of Children: N/A  . Years of Education: N/A   Occupational History  . Not on file.   Social History Main Topics  . Smoking status: Current Some Day Smoker    Types: Cigarettes  . Smokeless tobacco: Never Used     Comment: 1 cig every 4-5 days ---weaning off  . Alcohol Use: Yes  . Drug Use: No  . Sexually Active: Not Currently -- Female partner(s)   Other Topics Concern  . Not on file   Social History Narrative   Exercise --rare   Health Maintenance  Topic Date Due  . Influenza Vaccine  02/07/1959  . Tetanus/tdap  02/23/2012  . Mammogram  09/16/2013  . Pap Smear  04/14/2015  . Colonoscopy  07/18/2019    The following portions of the patient's history were reviewed and updated as appropriate:  She  has a past medical history of Hyperlipidemia and Hypertension. She  does not have any pertinent problems on file. She  has past surgical history that includes Abdominal hysterectomy. Her family history includes Breast cancer in an unspecified family member; Coronary artery disease in an unspecified family member; Diabetes in her sister; and Hypertension in an unspecified family member. She  reports that she has been smoking Cigarettes.  She has been smoking about 0.00 packs per day. She has never used smokeless tobacco. She reports that  drinks alcohol. She reports that she does not use illicit drugs. She has a current medication list which includes the following prescription(s): lisinopril-hydrochlorothiazide, cyclobenzaprine, niacin-simvastatin, and tramadol. Current Outpatient Prescriptions on File Prior to Visit  Medication Sig Dispense Refill  . lisinopril-hydrochlorothiazide (PRINZIDE,ZESTORETIC) 10-12.5 MG per tablet TAKE 1 TABLET EACH DAY.  90 tablet  0  .  Niacin-Simvastatin [facility]) 1000-40 MG TB24 Take 1 tablet by mouth at bedtime.  90 tablet  3   No current facility-administered medications on file prior to visit.   She has No Known Allergies..  Review of Systems Review of Systems  Constitutional: Negative for activity change, appetite change and fatigue.  HENT: Negative for hearing loss, congestion, tinnitus and ear discharge.  dentist q28m Eyes: Negative for visual disturbance (see optho q1y -- vision corrected to 20/20 with glasses).  Respiratory: Negative for cough, chest tightness and shortness of breath.   Cardiovascular: Negative for chest pain, palpitations and leg swelling.  Gastrointestinal: Negative for abdominal pain, diarrhea, constipation and abdominal distention.  Genitourinary: Negative for urgency, frequency, decreased urine volume and difficulty urinating.  Musculoskeletal: Negative for back pain, arthralgias and gait problem.  Skin: Negative for color change, pallor and rash.  Neurological: Negative for dizziness, light-headedness, numbness and headaches.  Hematological: Negative for adenopathy. Does not bruise/bleed easily.  Psychiatric/Behavioral: Negative for suicidal ideas, confusion, sleep disturbance, self-injury, dysphoric mood, decreased concentration and agitation.       Objective:    BP 138/98  Pulse 111  Temp(Src) 98 F (36.7 C) (Oral)  Ht 5\' 5"  (1.651 m)  Wt 180 lb (81.647 kg)  BMI 29.95 kg/m2  SpO2 98% General appearance: alert, cooperative, appears stated age and no distress Head: Normocephalic, without obvious abnormality, atraumatic Eyes: conjunctivae/corneas clear. PERRL, EOM's intact. Fundi benign. Ears: normal TM's and external ear canals both ears Nose: Nares normal. Septum  midline. Mucosa normal. No drainage or sinus tenderness. Throat: lips, mucosa, and tongue normal; teeth and gums normal Neck: no adenopathy, no carotid bruit, no JVD, supple, symmetrical, trachea midline and thyroid  not enlarged, symmetric, no tenderness/mass/nodules Back: symmetric, no curvature. ROM normal. No CVA tenderness. Lungs: clear to auscultation bilaterally Breasts: gyn Heart: regular rate and rhythm, S1, S2 normal, no murmur, click, rub or gallop Abdomen: soft, non-tender; bowel sounds normal; no masses,  no organomegaly Pelvic: deferred --gyn Extremities: extremities normal, atraumatic, no cyanosis or edema Pulses: 2+ and symmetric Skin: Skin color, texture, turgor normal. No rashes or lesions Lymph nodes: Cervical, supraclavicular, and axillary nodes normal. Neurologic: Alert and oriented X 3, normal strength and tone. Normal symmetric reflexes. Normal coordination and gait Psych-- no anxiety, no depression      Assessment:    Healthy female exam.      Plan:  Check labs ghm utd   See After Visit Summary for Counseling Recommendations

## 2012-08-30 LAB — CBC WITH DIFFERENTIAL/PLATELET
Eosinophils Relative: 1.3 % (ref 0.0–5.0)
HCT: 40.8 % (ref 36.0–46.0)
Hemoglobin: 13.8 g/dL (ref 12.0–15.0)
Lymphs Abs: 2.2 10*3/uL (ref 0.7–4.0)
MCV: 90.2 fl (ref 78.0–100.0)
Monocytes Absolute: 0.5 10*3/uL (ref 0.1–1.0)
Monocytes Relative: 6.4 % (ref 3.0–12.0)
Neutro Abs: 5.5 10*3/uL (ref 1.4–7.7)
Platelets: 362 10*3/uL (ref 150.0–400.0)
RDW: 13.3 % (ref 11.5–14.6)
WBC: 8.4 10*3/uL (ref 4.5–10.5)

## 2012-08-30 LAB — BASIC METABOLIC PANEL
BUN: 18 mg/dL (ref 6–23)
Chloride: 107 mEq/L (ref 96–112)
GFR: 74.41 mL/min (ref >60.00)
Glucose, Bld: 99 mg/dL (ref 70–99)
Potassium: 4.3 mEq/L (ref 3.5–5.1)
Sodium: 138 mEq/L (ref 135–145)

## 2012-08-30 LAB — HEPATIC FUNCTION PANEL
ALT: 29 U/L (ref 0–35)
AST: 26 U/L (ref 0–37)
Albumin: 4.4 g/dL (ref 3.5–5.2)

## 2012-08-30 LAB — POCT URINALYSIS DIPSTICK
Bilirubin, UA: NEGATIVE
Blood, UA: NEGATIVE
Glucose, UA: NEGATIVE
Ketones, UA: NEGATIVE
Spec Grav, UA: >=1.03
pH, UA: 6

## 2012-08-30 LAB — LIPID PANEL
Cholesterol: 283 mg/dL — ABNORMAL HIGH (ref 0–200)
Triglycerides: 153 mg/dL — ABNORMAL HIGH (ref 0.0–149.0)

## 2012-08-30 LAB — LDL CHOLESTEROL, DIRECT: Direct LDL: 213.8 mg/dL

## 2012-08-31 ENCOUNTER — Telehealth: Payer: Self-pay | Admitting: *Deleted

## 2012-08-31 MED ORDER — ATORVASTATIN CALCIUM 20 MG PO TABS: 20.0000 mg | ORAL_TABLET | Freq: Every day | ORAL | Status: DC

## 2012-08-31 NOTE — Telephone Encounter (Signed)
Discuss with patient, Rx sent. 

## 2012-08-31 NOTE — Telephone Encounter (Signed)
Message copied by Verdene Rio on Wed Aug 31, 2012  1:17 PM ------      Message from: Lelon Perla      Created: Tue Aug 30, 2012  5:27 PM       Change simcor ------lipitor 20 mg  #30  1 po qhs , 2 refills  --- recheck labs in 3 months-----      Cholesterol--- LDL goal < 100,  HDL >40,  TG < 150.  Diet and exercise will increase HDL and decrease LDL and TG.  Fish,  Fish Oil, Flaxseed oil will also help increase the HDL and decrease Triglycerides.   Recheck labs in 3 months.             ------

## 2012-09-09 ENCOUNTER — Ambulatory Visit
Admission: RE | Admit: 2012-09-09 | Discharge: 2012-09-09 | Disposition: A | Discharge status: Home / Self Care | Payer: Federal, State, Local not specified - PPO | Source: Ambulatory Visit | Attending: Obstetrics and Gynecology | Admitting: Obstetrics and Gynecology

## 2012-09-09 DIAGNOSIS — Z1231 Encounter for screening mammogram for malignant neoplasm of breast: Secondary | ICD-10-CM

## 2012-09-12 IMAGING — MG MM DIGITAL SCREENING BILAT
8 series · 8 of 24 positions shown · non-contrast
Comparison: Previous exams.

CLINICAL DATA: Screening.

DIGITAL BILATERAL SCREENING MAMMOGRAM WITH CAD
DIGITAL BREAST TOMOSYNTHESIS
Digital breast tomosynthesis images are acquired in two
projections.  These images are reviewed in combination with the
digital mammogram, confirming the findings below.

[L CC]
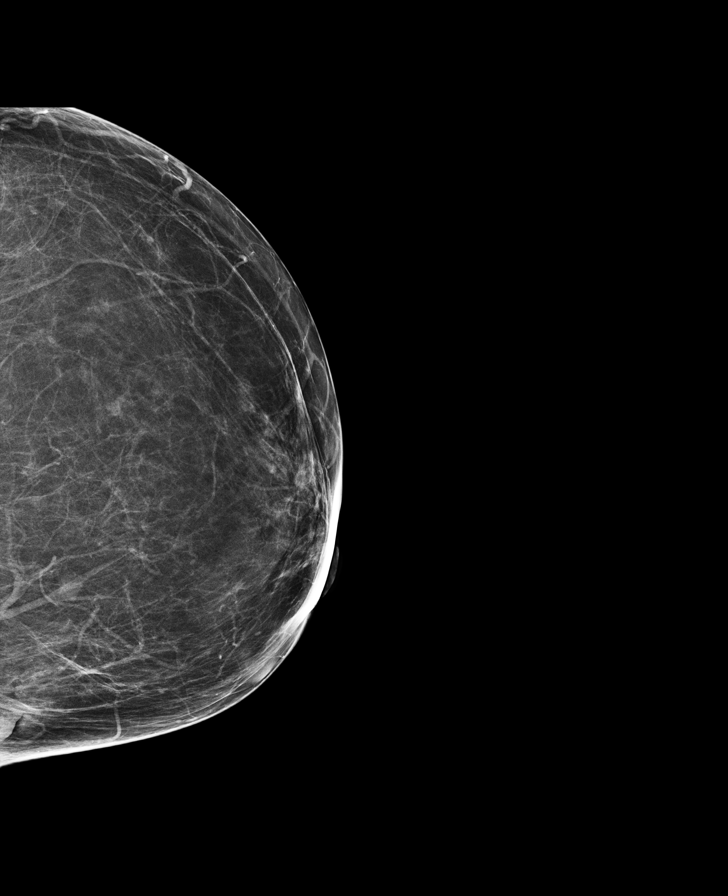

[L MLO]
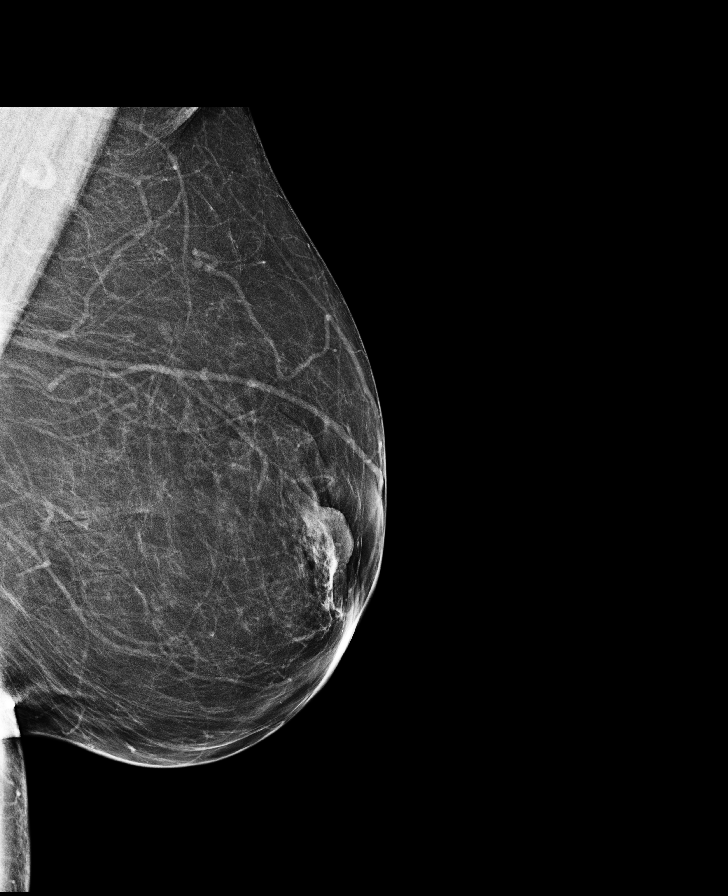

[R MLO]
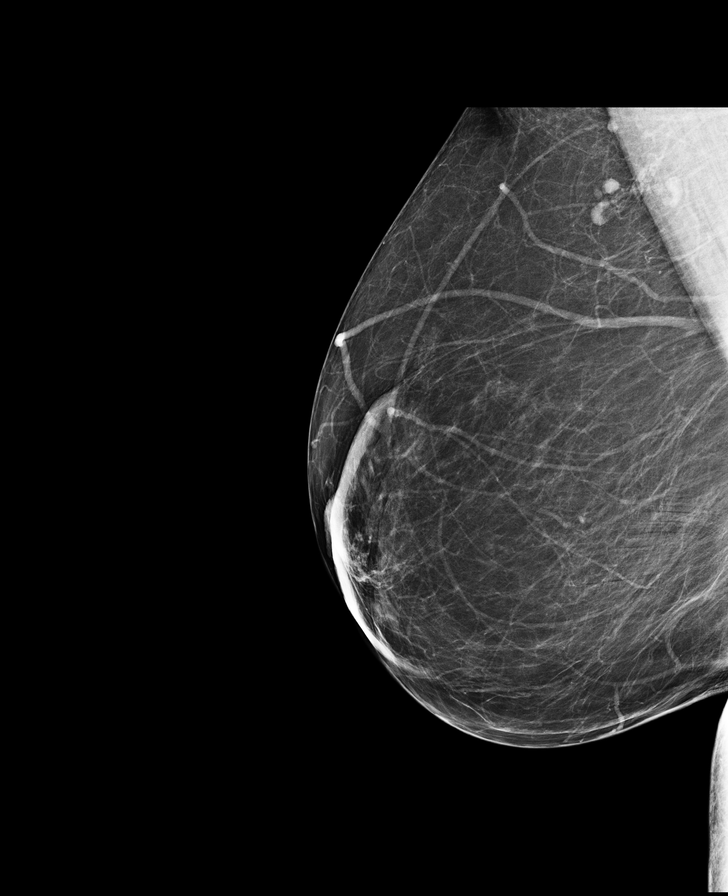

[R CC]
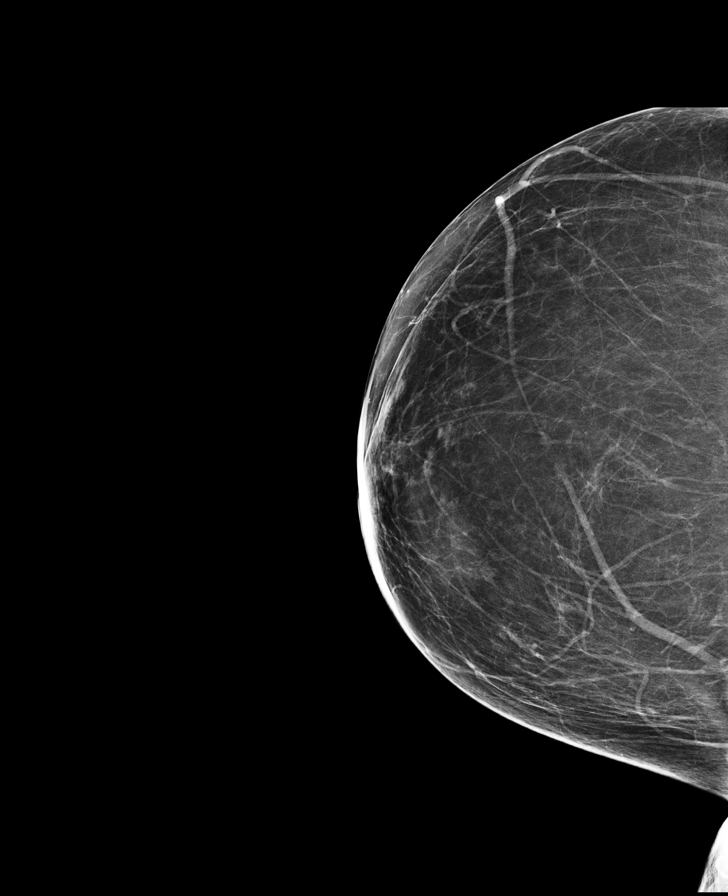

[R MLO tomo · tomo slice 39/77.0]
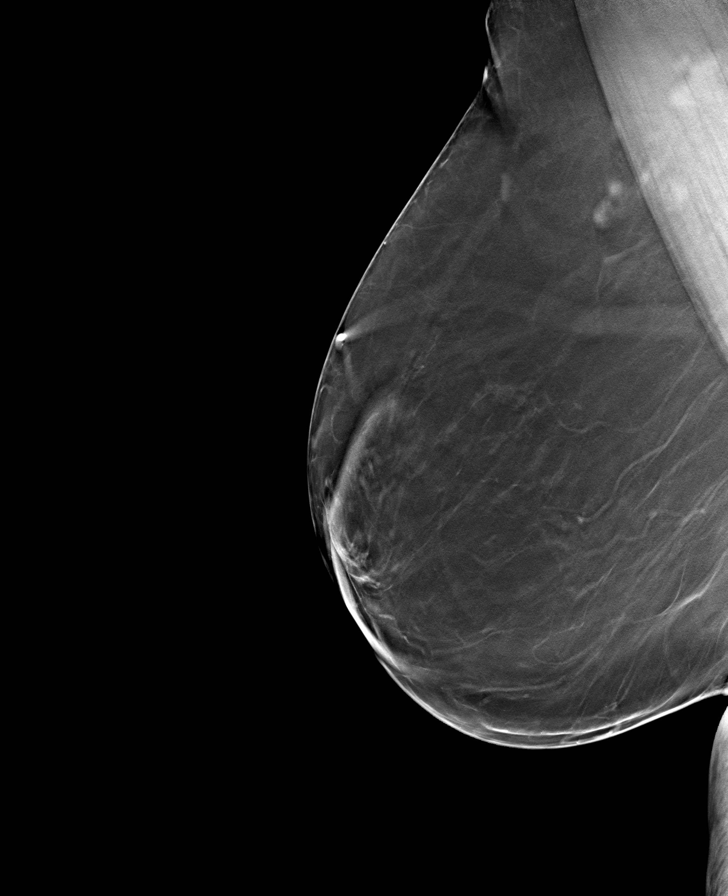

[R CC tomo · tomo slice 33/65.0]
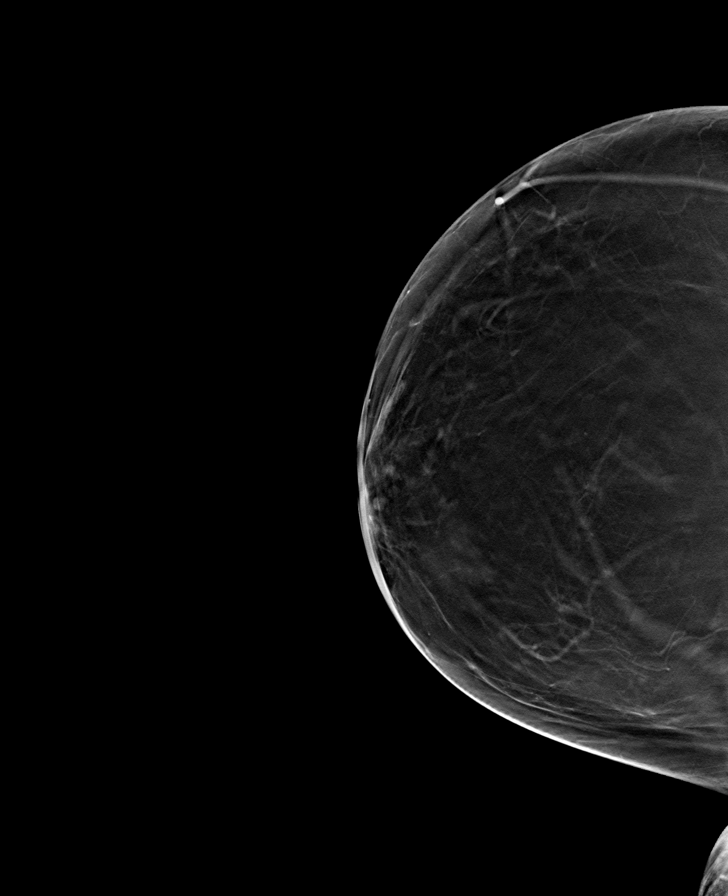

[L CC tomo · tomo slice 31/62.0]
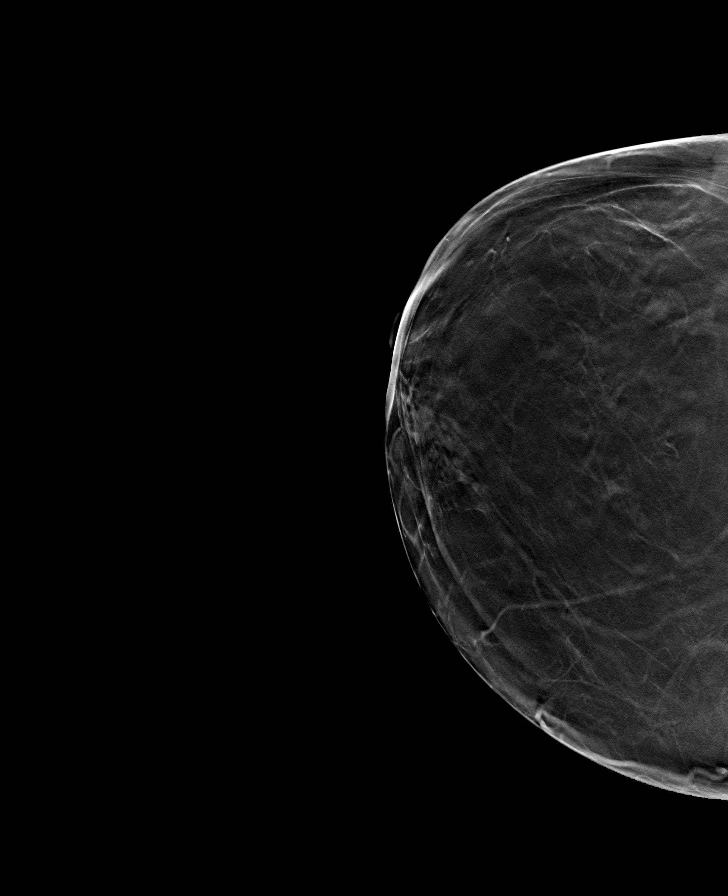

[L MLO tomo · tomo slice 41/80.0]
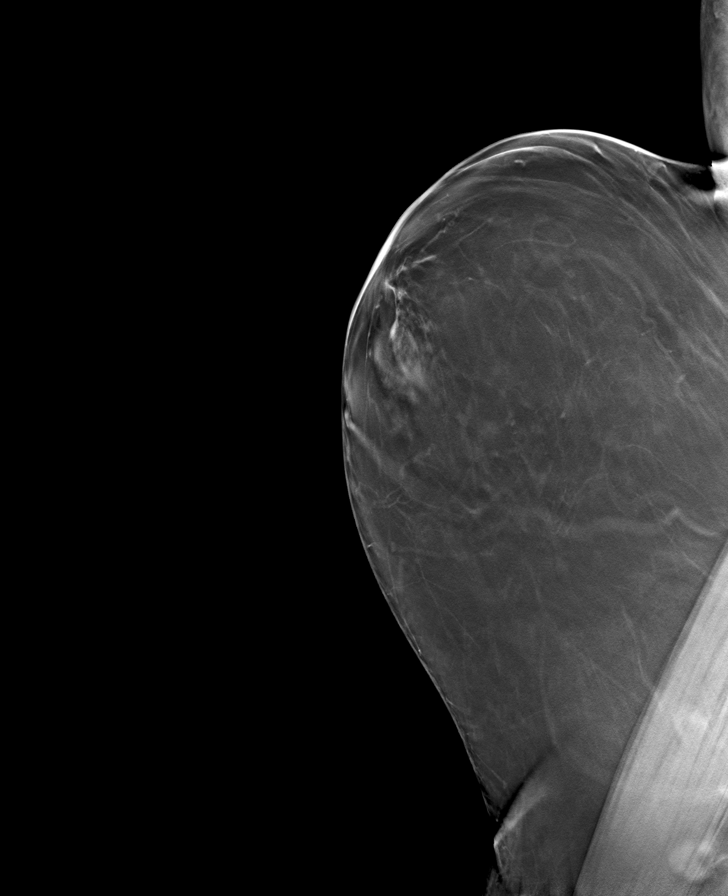

[8 of 24 positions shown; findings below may reference images not displayed]

FINDINGS: ACR Breast Density Category 2: There is a scattered fibroglandular
pattern.

No suspicious masses, architectural distortion, or calcifications
are present.

Images were processed with CAD.
IMPRESSION: No mammographic evidence of malignancy.

A result letter of this screening mammogram will be mailed directly
to the patient.

RECOMMENDATION:
Screening mammogram in one year. (Code:[OK])

BI-RADS CATEGORY 1:  Negative.

## 2012-09-14 ENCOUNTER — Encounter: Payer: Self-pay | Admitting: Lab

## 2012-09-15 ENCOUNTER — Ambulatory Visit (INDEPENDENT_AMBULATORY_CARE_PROVIDER_SITE_OTHER): Payer: Federal, State, Local not specified - PPO | Admitting: Family Medicine

## 2012-09-15 ENCOUNTER — Encounter: Payer: Self-pay | Admitting: Family Medicine

## 2012-09-15 VITALS — BP 120/70 | HR 130 | Temp 98.5°F | Wt 185.8 lb

## 2012-09-15 DIAGNOSIS — I10 Essential (primary) hypertension: Secondary | ICD-10-CM

## 2012-09-15 LAB — BASIC METABOLIC PANEL
CO2: 24 mEq/L (ref 19–32)
Chloride: 105 mEq/L (ref 96–112)
Creatinine, Ser: 1.3 mg/dL — ABNORMAL HIGH (ref 0.4–1.2)
Glucose, Bld: 146 mg/dL — ABNORMAL HIGH (ref 70–99)
Sodium: 138 mEq/L (ref 135–145)

## 2012-09-15 MED ORDER — LISINOPRIL-HYDROCHLOROTHIAZIDE 20-25 MG PO TABS: 1.0000 | ORAL_TABLET | Freq: Every day | ORAL | Status: DC

## 2012-09-15 NOTE — Patient Instructions (Addendum)

## 2012-09-15 NOTE — Addendum Note (Signed)
Addended by: Silvio Pate D on: 09/15/2012 02:36 PM   Modules accepted: Orders

## 2012-09-15 NOTE — Progress Notes (Signed)
  Subjective:    Patient here for follow-up of elevated blood pressure.  She is not exercising and is adherent to a low-salt diet.  Blood pressure is well controlled at home. Cardiac symptoms: none. Patient denies: chest pain, chest pressure/discomfort, claudication, dyspnea, exertional chest pressure/discomfort, fatigue, irregular heart beat, lower extremity edema, near-syncope, orthopnea, palpitations, paroxysmal nocturnal dyspnea, syncope and tachypnea. Cardiovascular risk factors: hypertension and sedentary lifestyle. Use of agents associated with hypertension: none. History of target organ damage: none.  The following portions of the patient's history were reviewed and updated as appropriate: allergies, current medications, past family history, past medical history, past social history, past surgical history and problem list.  Review of Systems Pertinent items are noted in HPI.     Objective:    BP 120/70  Pulse 130  Temp(Src) 98.5 F (36.9 C) (Oral)  Wt 185 lb 12.8 oz (84.278 kg)  BMI 30.92 kg/m2  SpO2 96% General appearance: alert, cooperative, appears stated age and no distress Neck: no adenopathy, supple, symmetrical, trachea midline and thyroid not enlarged, symmetric, no tenderness/mass/nodules Lungs: clear to auscultation bilaterally Heart: S1, S2 normal Extremities: extremities normal, atraumatic, no cyanosis or edema    Assessment:    Hypertension, normal blood pressure . Evidence of target organ damage: none.    Plan:    Medication: no change. Dietary sodium restriction. Regular aerobic exercise. Check blood pressures 2-3 times weekly and record. Follow up: 3 months and as needed.

## 2012-09-21 ENCOUNTER — Telehealth: Payer: Self-pay

## 2012-09-21 MED ORDER — LISINOPRIL 20 MG PO TABS: 20.0000 mg | ORAL_TABLET | Freq: Every day | ORAL | Status: DC

## 2012-09-21 NOTE — Telephone Encounter (Signed)
Message copied by Arnette Norris on Wed Sep 21, 2012  5:04 PM ------      Message from: Lelon Perla      Created: Mon Sep 19, 2012  8:20 PM       Bun / cr elevated.   We need to d/c hctz----  Recheck 2 weeks. ------

## 2012-09-21 NOTE — Telephone Encounter (Signed)
Discussed with patient and she voiced understanding. Rx for lisinopril 20 mg 1 po qd sent to the pharmacy. She had not picked up the other script at this time for the lisinopril-hctz.      KP

## 2012-10-04 ENCOUNTER — Ambulatory Visit (INDEPENDENT_AMBULATORY_CARE_PROVIDER_SITE_OTHER): Payer: Federal, State, Local not specified - PPO | Admitting: Family Medicine

## 2012-10-04 ENCOUNTER — Encounter: Payer: Self-pay | Admitting: Family Medicine

## 2012-10-04 VITALS — BP 120/92 | HR 69 | Temp 98.7°F | Wt 189.6 lb

## 2012-10-04 DIAGNOSIS — I10 Essential (primary) hypertension: Secondary | ICD-10-CM

## 2012-10-04 MED ORDER — AMLODIPINE BESYLATE 5 MG PO TABS: 5.0000 mg | ORAL_TABLET | Freq: Every day | ORAL | Status: DC

## 2012-10-04 NOTE — Progress Notes (Signed)
  Subjective:    Patient here for follow-up of elevated blood pressure.  She is not exercising and is adherent to a low-salt diet.  Blood pressure is not well controlled at home. Cardiac symptoms: none. Patient denies: chest pain, chest pressure/discomfort, claudication, dyspnea, exertional chest pressure/discomfort, fatigue, irregular heart beat, lower extremity edema, near-syncope, orthopnea, palpitations, paroxysmal nocturnal dyspnea, syncope and tachypnea. Cardiovascular risk factors: hypertension and sedentary lifestyle. Use of agents associated with hypertension: none. History of target organ damage: none.  The following portions of the patient's history were reviewed and updated as appropriate: allergies, current medications, past family history, past medical history, past social history, past surgical history and problem list.  Review of Systems Pertinent items are noted in HPI.     Objective:    BP 120/92  Pulse 69  Temp(Src) 98.7 F (37.1 C) (Oral)  Wt 189 lb 9.6 oz (86.002 kg)  BMI 31.55 kg/m2  SpO2 96% General appearance: alert, cooperative and no distress Lungs: clear to auscultation bilaterally Heart: S1, S2 normal Extremities: extremities normal, atraumatic, no cyanosis or edema    Assessment:    Hypertension, stage 1 . Evidence of target organ damage: none.    Plan:    Medication: begin norvasc 5. Screening labs for initial evaluation: none indicated. Check blood pressures 2-3 times weekly and record. Follow up: 3 months and as needed.

## 2012-10-04 NOTE — Patient Instructions (Addendum)

## 2012-10-08 ENCOUNTER — Encounter: Payer: Self-pay | Admitting: Family Medicine

## 2012-10-12 ENCOUNTER — Telehealth: Payer: Self-pay | Admitting: Family Medicine

## 2012-10-12 NOTE — Telephone Encounter (Signed)
Agree with CAN---- it takes time to get in system--- she will need ov within 2 weeks If she develops any symptoms---- sooner

## 2012-10-12 NOTE — Telephone Encounter (Signed)
Patient Information:  Caller Name: Ernestina  Phone: [phone]  Patient: Carrie [name], Carrie [name]  Gender: Female  DOB: [birth year]  Age: 52 Years  PCP: Lelon Perla.  Pregnant: No  Office Follow Up:  Does the office need to follow up with this patient?: Yes  Instructions For The Office: Latangela wanted Dr.Lowne to know her BP on the new med  RN Note:  will send note over to the office, but informed Saira that she may need an appt within the next two wks if continues to stay high because it may need to get in her system  Symptoms  Reason For Call & Symptoms: calling about BP; says normal BP is usually 130/93; has been taking Lisinopril; today, she started her new BP med, Amlodipine; 2hrs after taking it, her BP was 150/98; says she has some numbness to rt arm since 5/6, but feels that may be how she slept on it; says it feels better today with some Ibuprofen  Reviewed Health History In EMR: Yes  Reviewed Medications In EMR: Yes  Reviewed Allergies In EMR: Yes  Reviewed Surgeries / Procedures: Yes  Date of Onset of Symptoms: 10/12/2012 OB / GYN:  LMP: Unknown  Guideline(s) Used:  High Blood Pressure  Disposition Per Guideline:   See Within 2 Weeks in Office  Reason For Disposition Reached:   BP > 140/90 and is taking BP medications  Advice Given:  Call Back If:  Headache, blurred vision, difficulty talking, or difficulty walking occurs  Chest pain or difficulty breathing occurs  You want to go in to the office for a blood pressure check  You become worse.  RN Overrode Recommendation:  Follow Up With Office Later  will call back about an appt

## 2012-10-12 NOTE — Telephone Encounter (Signed)
Will forward to Dr Laury Axon for review

## 2012-10-12 NOTE — Telephone Encounter (Signed)
msg left to call the office     KP 

## 2012-10-13 NOTE — Telephone Encounter (Signed)
msg left to call the office     KP 

## 2012-10-14 NOTE — Telephone Encounter (Signed)
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 Pt BP on yesterday in the beginning in AM was 150/98 and then In the PM140/92. Pt noted that BP is always more elevated in the AM. Pt denies any symptom  As of now and states that she feels fine. Pt advise will forward to Dr Laury Axon but if she become symptomatic she will need to be seen in ED Pt ok will await call back. Ok to leave detail VM on cerll [phone]  Pt made aware of Dr Laury Axon response.

## 2012-10-14 NOTE — Telephone Encounter (Signed)
Ov Monday---- er over weekend

## 2012-10-14 NOTE — Telephone Encounter (Signed)
msg left to call the office     KP 

## 2012-10-19 NOTE — Telephone Encounter (Signed)
Spoke with patient and scheduled apt for Monday due to work schedule.    KP

## 2012-10-24 ENCOUNTER — Encounter: Payer: Self-pay | Admitting: Family Medicine

## 2012-10-24 ENCOUNTER — Ambulatory Visit (INDEPENDENT_AMBULATORY_CARE_PROVIDER_SITE_OTHER): Payer: Federal, State, Local not specified - PPO | Admitting: Family Medicine

## 2012-10-24 VITALS — BP 140/100 | HR 100 | Temp 98.0°F | Wt 185.0 lb

## 2012-10-24 DIAGNOSIS — I10 Essential (primary) hypertension: Secondary | ICD-10-CM

## 2012-10-24 MED ORDER — LISINOPRIL 20 MG PO TABS: 20.0000 mg | ORAL_TABLET | Freq: Every day | ORAL | Status: DC

## 2012-10-24 NOTE — Progress Notes (Signed)
  Subjective:    Patient here for follow-up of elevated blood pressure.  She is not exercising and is adherent to a low-salt diet.  Blood pressure is not well controlled at home. Cardiac symptoms: none. Patient denies: chest pain, chest pressure/discomfort, claudication, dyspnea, exertional chest pressure/discomfort, fatigue, irregular heart beat, lower extremity edema, near-syncope, orthopnea, palpitations, paroxysmal nocturnal dyspnea, syncope and tachypnea. Cardiovascular risk factors: dyslipidemia, hypertension and sedentary lifestyle. Use of agents associated with hypertension: none. History of target organ damage: none.  The following portions of the patient's history were reviewed and updated as appropriate: allergies, current medications, past family history, past medical history, past social history, past surgical history and problem list.  Review of Systems Pertinent items are noted in HPI.     Objective:    BP 140/100  Pulse 100  Temp(Src) 98 F (36.7 C) (Oral)  Wt 185 lb (83.915 kg)  BMI 30.79 kg/m2  SpO2 97% General appearance: alert, cooperative, appears stated age and no distress Throat: lips, mucosa, and tongue normal; teeth and gums normal Neck: no adenopathy, no carotid bruit, no JVD, supple, symmetrical, trachea midline and thyroid not enlarged, symmetric, no tenderness/mass/nodules Lungs: clear to auscultation bilaterally Heart: S1, S2 normal Extremities: extremities normal, atraumatic, no cyanosis or edema    Assessment:    Hypertension, stage 1 . Evidence of target organ damage: none.    Plan:    Medication: continue norvasc and resume lisinopril. Dietary sodium restriction. Regular aerobic exercise. Check blood pressures 2-3 times weekly and record. Follow up: 1 month and as needed.

## 2012-10-24 NOTE — Patient Instructions (Signed)

## 2013-01-24 ENCOUNTER — Ambulatory Visit (INDEPENDENT_AMBULATORY_CARE_PROVIDER_SITE_OTHER): Payer: Federal, State, Local not specified - PPO | Admitting: Family Medicine

## 2013-01-24 ENCOUNTER — Encounter: Payer: Self-pay | Admitting: Family Medicine

## 2013-01-24 VITALS — BP 146/92 | HR 88 | Temp 98.6°F | Wt 185.0 lb

## 2013-01-24 DIAGNOSIS — Z23 Encounter for immunization: Secondary | ICD-10-CM

## 2013-01-24 DIAGNOSIS — Z111 Encounter for screening for respiratory tuberculosis: Secondary | ICD-10-CM

## 2013-01-24 DIAGNOSIS — I10 Essential (primary) hypertension: Secondary | ICD-10-CM

## 2013-01-24 DIAGNOSIS — E785 Hyperlipidemia, unspecified: Secondary | ICD-10-CM

## 2013-01-24 LAB — BASIC METABOLIC PANEL
BUN: 14 mg/dL (ref 6–23)
Chloride: 105 mEq/L (ref 96–112)
Creatinine, Ser: 1 mg/dL (ref 0.4–1.2)
Glucose, Bld: 65 mg/dL — ABNORMAL LOW (ref 70–99)
Potassium: 3.5 mEq/L (ref 3.5–5.1)

## 2013-01-24 LAB — LIPID PANEL: Cholesterol: 182 mg/dL (ref 0–200)

## 2013-01-24 LAB — HEPATIC FUNCTION PANEL
ALT: 19 U/L (ref 0–35)
AST: 19 U/L (ref 0–37)
Albumin: 4.3 g/dL (ref 3.5–5.2)

## 2013-01-24 MED ORDER — AMLODIPINE BESYLATE 5 MG PO TABS: 5.0000 mg | ORAL_TABLET | Freq: Every day | ORAL | Status: DC

## 2013-01-24 MED ORDER — LISINOPRIL 20 MG PO TABS: 20.0000 mg | ORAL_TABLET | Freq: Every day | ORAL | Status: DC

## 2013-01-24 NOTE — Progress Notes (Signed)
  Subjective:    Patient here for follow-up of elevated blood pressure.  She is not exercising and is adherent to a low-salt diet.  Blood pressure is well controlled at home. Cardiac symptoms: none. Patient denies: chest pain, chest pressure/discomfort, claudication, dyspnea, exertional chest pressure/discomfort, fatigue, irregular heart beat, lower extremity edema, near-syncope, orthopnea, palpitations, paroxysmal nocturnal dyspnea, syncope and tachypnea. Cardiovascular risk factors: dyslipidemia, hypertension, obesity (BMI >= 30 kg/m2) and sedentary lifestyle. Use of agents associated with hypertension: none. History of target organ damage: none.  Pt is also here for cholesterol check.  See above for ros. She also needs the form filled out for foster care. The following portions of the patient's history were reviewed and updated as appropriate: allergies, current medications, past family history, past medical history, past social history, past surgical history and problem list.  Review of Systems Pertinent items are noted in HPI.     Objective:    BP 146/92  Pulse 88  Temp(Src) 98.6 F (37 C) (Oral)  Wt 185 lb (83.915 kg)  BMI 30.79 kg/m2  SpO2 97% General appearance: alert, cooperative, appears stated age and no distress Throat: lips, mucosa, and tongue normal; teeth and gums normal Neck: no adenopathy, no carotid bruit, no JVD, supple, symmetrical, trachea midline and thyroid not enlarged, symmetric, no tenderness/mass/nodules Lungs: clear to auscultation bilaterally Heart: S1, S2 normal Extremities: extremities normal, atraumatic, no cyanosis or edema    Assessment:    Hypertension, elevated today but pat has not taken meds today . Evidence of target organ damage: none.    Plan:    Medication: no change. Dietary sodium restriction. Regular aerobic exercise. Check blood pressures 2-3 times weekly and record. Follow up: 6 months and as needed.

## 2013-01-24 NOTE — Patient Instructions (Signed)

## 2013-01-25 NOTE — Assessment & Plan Note (Signed)
Check labs con't meds 

## 2013-01-26 ENCOUNTER — Ambulatory Visit: Payer: Federal, State, Local not specified - PPO

## 2013-01-26 LAB — TB SKIN TEST: Induration: 0 mm

## 2013-03-04 ENCOUNTER — Other Ambulatory Visit: Payer: Self-pay | Admitting: Family Medicine

## 2013-08-09 ENCOUNTER — Ambulatory Visit: Payer: Federal, State, Local not specified - PPO | Admitting: Internal Medicine

## 2013-09-28 ENCOUNTER — Other Ambulatory Visit: Payer: Self-pay

## 2013-09-28 DIAGNOSIS — Z1231 Encounter for screening mammogram for malignant neoplasm of breast: Secondary | ICD-10-CM

## 2013-10-10 ENCOUNTER — Inpatient Hospital Stay: Admission: RE | Admit: 2013-10-10 | Payer: Federal, State, Local not specified - PPO | Source: Ambulatory Visit

## 2013-10-24 ENCOUNTER — Inpatient Hospital Stay: Admission: RE | Admit: 2013-10-24 | Payer: Federal, State, Local not specified - PPO | Source: Ambulatory Visit

## 2013-11-07 ENCOUNTER — Ambulatory Visit
Admission: RE | Admit: 2013-11-07 | Discharge: 2013-11-07 | Disposition: A | Discharge status: Home / Self Care | Payer: Federal, State, Local not specified - PPO | Source: Ambulatory Visit

## 2013-11-07 DIAGNOSIS — Z1231 Encounter for screening mammogram for malignant neoplasm of breast: Secondary | ICD-10-CM

## 2013-11-07 IMAGING — MG MM DIGITAL SCREENING BILAT
5 series · 5 of 5 positions shown · non-contrast
Comparison: Previous exam(s)

ACR Breast Density Category a: The breast tissue is almost entirely
fatty.

CLINICAL DATA: Screening.

EXAM:
DIGITAL SCREENING BILATERAL MAMMOGRAM WITH CAD

[L CC]
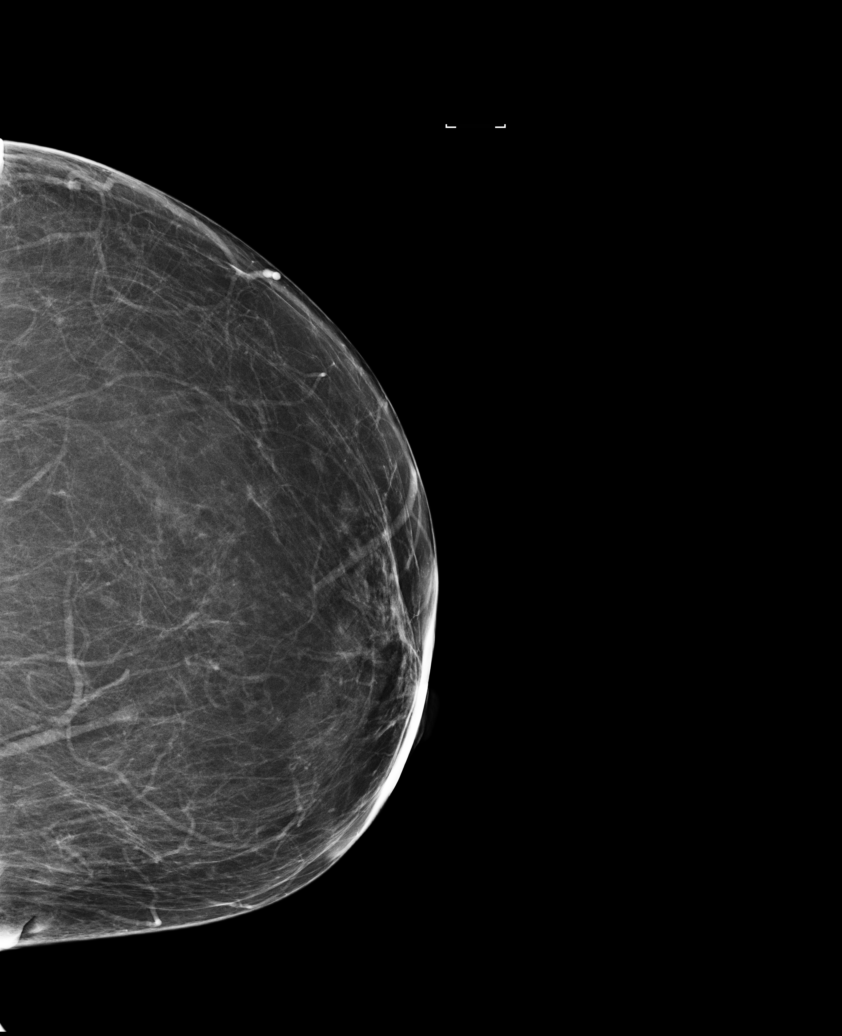

[L MLO (1 of 2)]
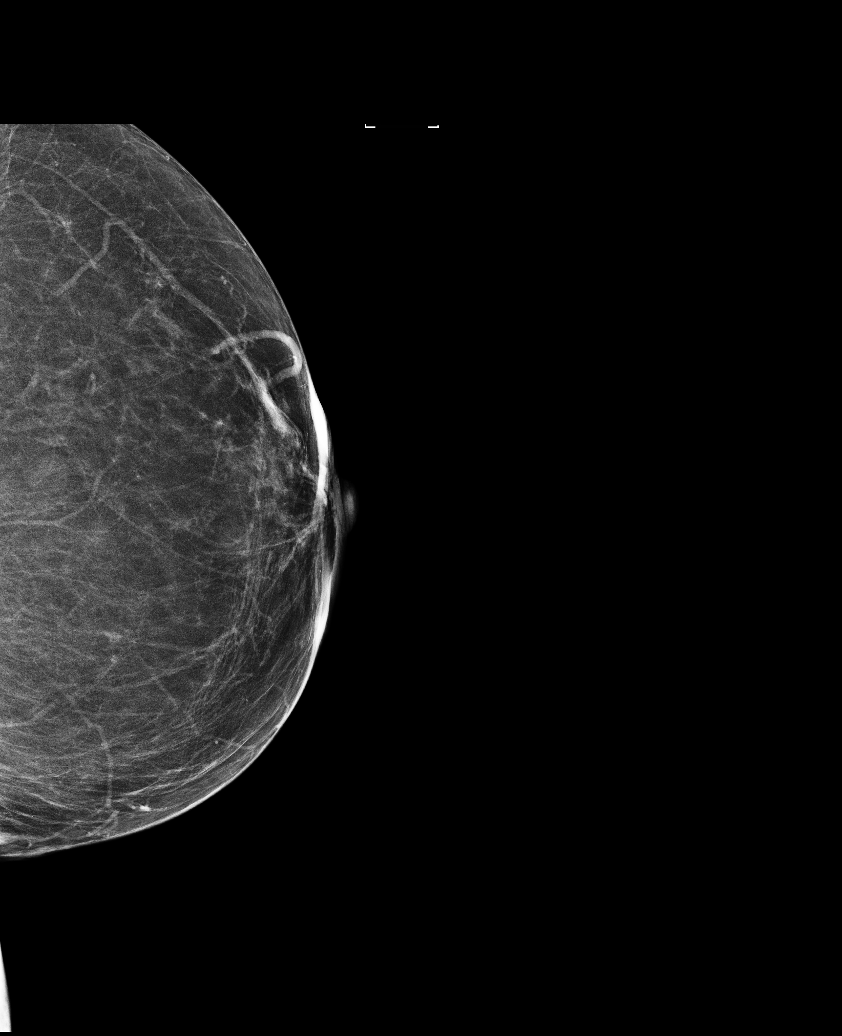

[R MLO]
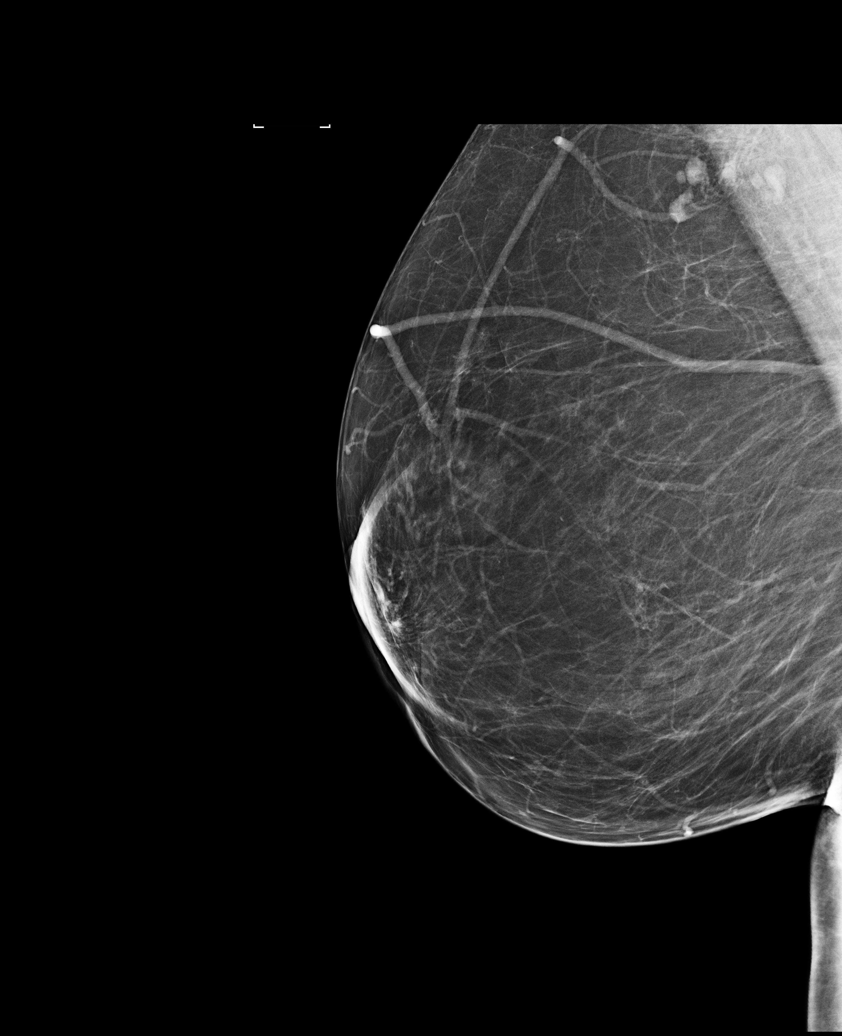

[R CC]
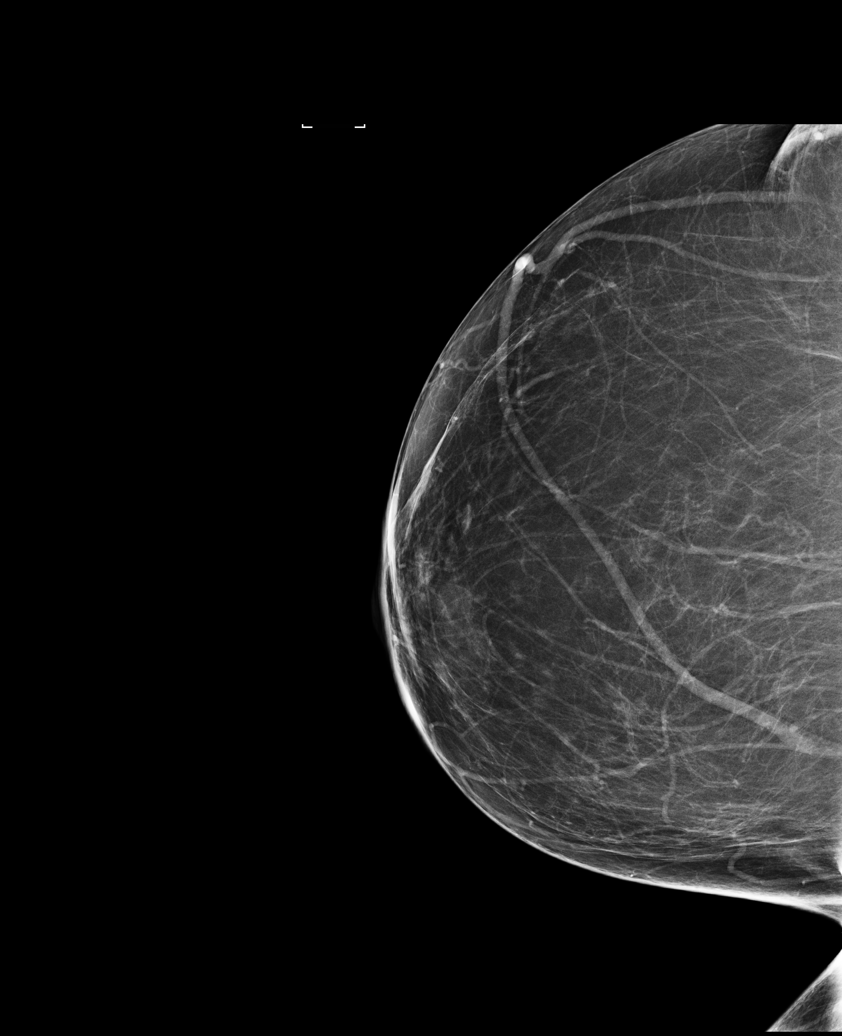

[L MLO (2 of 2)]
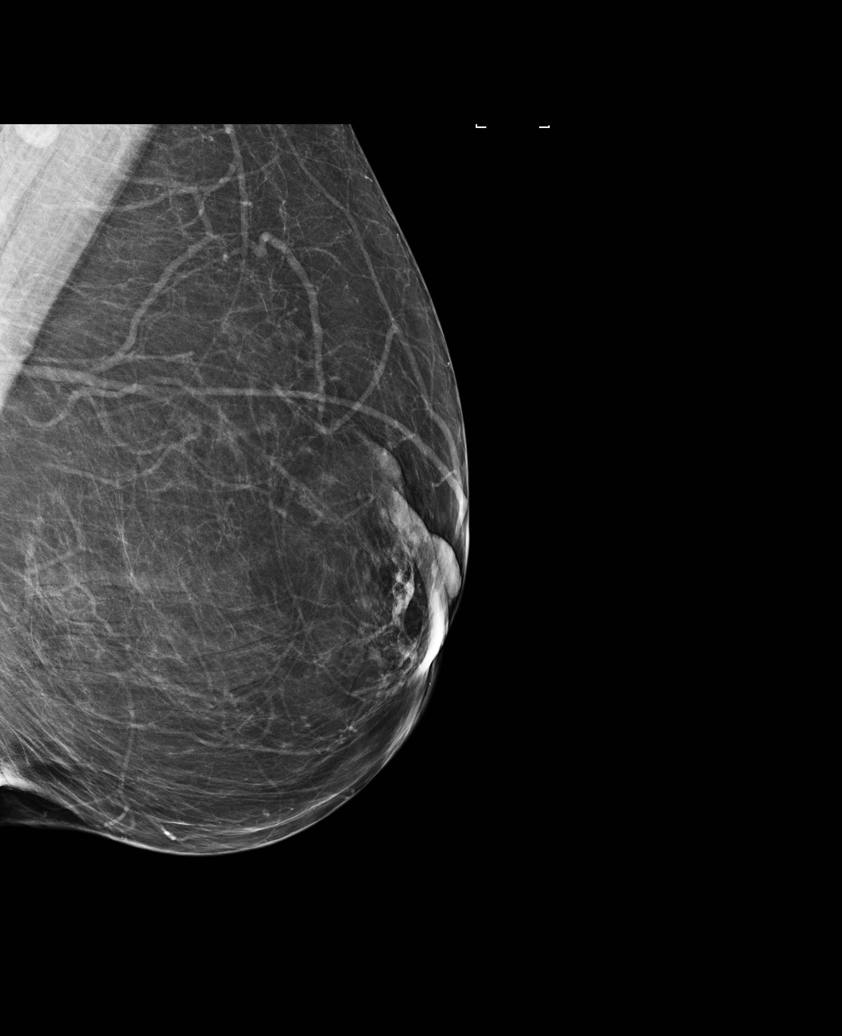

[5 of 5 positions shown; findings below may reference images not displayed]

FINDINGS: There are no findings suspicious for malignancy. Images were
processed with CAD.
IMPRESSION: No mammographic evidence of malignancy. A result letter of this
screening mammogram will be mailed directly to the patient.

RECOMMENDATION:
Screening mammogram in one year. (Code:[0Q])

BI-RADS CATEGORY  1: Negative.

## 2013-12-18 ENCOUNTER — Ambulatory Visit (INDEPENDENT_AMBULATORY_CARE_PROVIDER_SITE_OTHER): Payer: Federal, State, Local not specified - PPO | Admitting: Family Medicine

## 2013-12-18 ENCOUNTER — Encounter: Payer: Self-pay | Admitting: Family Medicine

## 2013-12-18 VITALS — BP 120/80 | HR 73 | Temp 98.4°F | Wt 190.0 lb

## 2013-12-18 DIAGNOSIS — R5383 Other fatigue: Secondary | ICD-10-CM

## 2013-12-18 DIAGNOSIS — F3289 Other specified depressive episodes: Secondary | ICD-10-CM

## 2013-12-18 DIAGNOSIS — F329 Major depressive disorder, single episode, unspecified: Secondary | ICD-10-CM

## 2013-12-18 DIAGNOSIS — M791 Myalgia, unspecified site: Secondary | ICD-10-CM

## 2013-12-18 DIAGNOSIS — E785 Hyperlipidemia, unspecified: Secondary | ICD-10-CM

## 2013-12-18 DIAGNOSIS — R5381 Other malaise: Secondary | ICD-10-CM

## 2013-12-18 DIAGNOSIS — IMO0001 Reserved for inherently not codable concepts without codable children: Secondary | ICD-10-CM

## 2013-12-18 DIAGNOSIS — I10 Essential (primary) hypertension: Secondary | ICD-10-CM

## 2013-12-18 DIAGNOSIS — F32A Depression, unspecified: Secondary | ICD-10-CM

## 2013-12-18 MED ORDER — AMLODIPINE BESYLATE 5 MG PO TABS: 5.0000 mg | ORAL_TABLET | Freq: Every day | ORAL | Status: DC

## 2013-12-18 MED ORDER — ATORVASTATIN CALCIUM 20 MG PO TABS: ORAL_TABLET | ORAL | Status: DC

## 2013-12-18 MED ORDER — BUPROPION HCL ER (XL) 150 MG PO TB24: ORAL_TABLET | ORAL | Status: DC

## 2013-12-18 MED ORDER — LISINOPRIL 20 MG PO TABS: 20.0000 mg | ORAL_TABLET | Freq: Every day | ORAL | Status: DC

## 2013-12-18 NOTE — Progress Notes (Signed)
Pre visit review using our clinic review tool, if applicable. No additional management support is needed unless otherwise documented below in the visit note. 

## 2013-12-18 NOTE — Patient Instructions (Signed)
Depression, Adult Depression refers to feeling sad, low, down in the dumps, blue, gloomy, or empty. In general, there are two kinds of depression: 1. Depression that we all experience from time to time because of upsetting life experiences, including the loss of a job or the ending of a relationship (normal sadness or normal grief). This kind of depression is considered normal, is short lived, and resolves within a few days to 2 weeks. (Depression experienced after the loss of a loved one is called bereavement. Bereavement often lasts longer than 2 weeks but normally gets better with time.) 2. Clinical depression, which lasts longer than normal sadness or normal grief or interferes with your ability to function at home, at work, and in school. It also interferes with your personal relationships. It affects almost every aspect of your life. Clinical depression is an illness. Symptoms of depression also can be caused by conditions other than normal sadness and grief or clinical depression. Examples of these conditions are listed as follows:  Physical illness--Some physical illnesses, including underactive thyroid gland (hypothyroidism), severe anemia, specific types of cancer, diabetes, uncontrolled seizures, heart and lung problems, strokes, and chronic pain are commonly associated with symptoms of depression.  Side effects of some prescription medicine--In some people, certain types of prescription medicine can cause symptoms of depression.  Substance abuse--Abuse of alcohol and illicit drugs can cause symptoms of depression. SYMPTOMS Symptoms of normal sadness and normal grief include the following:  Feeling sad or crying for short periods of time.  Not caring about anything (apathy).  Difficulty sleeping or sleeping too much.  No longer able to enjoy the things you used to enjoy.  Desire to be by oneself all the time (social isolation).  Lack of energy or motivation.  Difficulty  concentrating or remembering.  Change in appetite or weight.  Restlessness or agitation. Symptoms of clinical depression include the same symptoms of normal sadness or normal grief and also the following symptoms:  Feeling sad or crying all the time.  Feelings of guilt or worthlessness.  Feelings of hopelessness or helplessness.  Thoughts of suicide or the desire to harm yourself (suicidal ideation).  Loss of touch with reality (psychotic symptoms). Seeing or hearing things that are not real (hallucinations) or having false beliefs about your life or the people around you (delusions and paranoia). DIAGNOSIS  The diagnosis of clinical depression usually is based on the severity and duration of the symptoms. Your caregiver also will ask you questions about your medical history and substance use to find out if physical illness, use of prescription medicine, or substance abuse is causing your depression. Your caregiver also may order blood tests. TREATMENT  Typically, normal sadness and normal grief do not require treatment. However, sometimes antidepressant medicine is prescribed for bereavement to ease the depressive symptoms until they resolve. The treatment for clinical depression depends on the severity of your symptoms but typically includes antidepressant medicine, counseling with a mental health professional, or a combination of both. Your caregiver will help to determine what treatment is best for you. Depression caused by physical illness usually goes away with appropriate medical treatment of the illness. If prescription medicine is causing depression, talk with your caregiver about stopping the medicine, decreasing the dose, or substituting another medicine. Depression caused by abuse of alcohol or illicit drugs abuse goes away with abstinence from these substances. Some adults need professional help in order to stop drinking or using drugs. SEEK IMMEDIATE CARE IF:  You have thoughts  about   hurting yourself or others.  You lose touch with reality (have psychotic symptoms).  You are taking medicine for depression and have a serious side effect. FOR MORE INFORMATION National Alliance on Mental Illness: www.nami.org National Institute of Mental Health: www.nimh.nih.gov Document Released: 05/22/2000 Document Revised: 11/24/2011 Document Reviewed: 08/24/2011 Exit[employer] Patient Information 2015 [employer]. This information is not intended to replace advice given to you by your health care provider. Make sure you discuss any questions you have with your health care provider.  

## 2013-12-18 NOTE — Progress Notes (Signed)
Subjective:    Patient here for follow-up of elevated blood pressure.  She is not exercising and is adherent to a low-salt diet.  Blood pressure is well controlled at home. Cardiac symptoms: fatigue. Patient denies: chest pain, chest pressure/discomfort, claudication, dyspnea, exertional chest pressure/discomfort, irregular heart beat, lower extremity edema, near-syncope, orthopnea, palpitations, paroxysmal nocturnal dyspnea, syncope and tachypnea. Cardiovascular risk factors: dyslipidemia, hypertension and sedentary lifestyle. Use of agents associated with hypertension: none. History of target organ damage: none.  Pt also c/o muscle aches and weakness with lipitor and stopped it a while back.  She is requesting something different.   Pt also started crying and said she is very depressed, not suicidal -- things at work are very stressful.    The following portions of the patient's history were reviewed and updated as appropriate:  She  has a past medical history of Hyperlipidemia and Hypertension. She  does not have any pertinent problems on file. She  has past surgical history that includes Abdominal hysterectomy. Her family history includes Breast cancer in an other family member; Coronary artery disease in an other family member; Diabetes in her sister; Hypertension in an other family member. She  reports that she has been smoking Cigarettes.  She has been smoking about 0.00 packs per day. She has never used smokeless tobacco. She reports that she drinks alcohol. She reports that she does not use illicit drugs. She has a current medication list which includes the following prescription(s): amlodipine, atorvastatin, lisinopril, and bupropion. No current outpatient prescriptions on file prior to visit.   No current facility-administered medications on file prior to visit.   She has No Known Allergies..  Review of Systems Pertinent items are noted in HPI.     Objective:    BP 120/80   Pulse 73  Temp(Src) 98.4 F (36.9 C) (Oral)  Wt 190 lb (86.183 kg)  SpO2 98% General appearance: alert, cooperative, appears stated age and no distress Lungs: clear to auscultation bilaterally Heart: S1, S2 normal Extremities: extremities normal, atraumatic, no cyanosis or edema Neurologic: Mental status: Alert, oriented, thought content appropriate, when questioned about suicide, the patient expresses no suicidal ideation, no homicidal ideation    Assessment:    Hypertension, normal blood pressure . Evidence of target organ damage: none.    Plan:    Medication: no change. Dietary sodium restriction. Regular aerobic exercise. Follow up: 1 month and as needed. --f/u depression  1. Essential hypertension  - lisinopril (PRINIVIL,ZESTRIL) 20 MG tablet; Take 1 tablet (20 mg total) by mouth daily.  Dispense: 90 tablet; Refill: 1 - amLODipine (NORVASC) 5 MG tablet; Take 1 tablet (5 mg total) by mouth daily.  Dispense: 90 tablet; Refill: 1 - Basic metabolic panel; Future - Hepatic function panel; Future - Lipid panel; Future - CBC with Differential; Future - POCT urinalysis dipstick; Future - TSH; Future - Vitamin B12; Future - Vitamin D 1,25 dihydroxy; Future - CK (Creatine Kinase); Future  2. Myalgia Stop lipitor for now - Basic metabolic panel; Future - Hepatic function panel; Future - Lipid panel; Future - CBC with Differential; Future - POCT urinalysis dipstick; Future - TSH; Future - Vitamin B12; Future - Vitamin D 1,25 dihydroxy; Future - CK (Creatine Kinase); Future  3. Other and unspecified hyperlipidemia Check labs--- off lipitor - Basic metabolic panel; Future - Hepatic function panel; Future - Lipid panel; Future - CBC with Differential; Future - POCT urinalysis dipstick; Future - TSH; Future - Vitamin B12; Future - Vitamin D 1,25 dihydroxy;  Future - CK (Creatine Kinase); Future  4. Other malaise and fatigue ? lipitor or depression - Basic metabolic  panel; Future - Hepatic function panel; Future - Lipid panel; Future - CBC with Differential; Future - POCT urinalysis dipstick; Future - TSH; Future - Vitamin B12; Future - Vitamin D 1,25 dihydroxy; Future - CK (Creatine Kinase); Future  5. Depression  - buPROPion (WELLBUTRIN XL) 150 MG 24 hr tablet; 1 po qd x 1 week then 2 po qd  Dispense: 30 tablet; Refill: 0 - Ambulatory referral to Psychology

## 2013-12-20 ENCOUNTER — Other Ambulatory Visit (INDEPENDENT_AMBULATORY_CARE_PROVIDER_SITE_OTHER): Payer: Federal, State, Local not specified - PPO

## 2013-12-20 DIAGNOSIS — M791 Myalgia, unspecified site: Secondary | ICD-10-CM

## 2013-12-20 DIAGNOSIS — R5383 Other fatigue: Secondary | ICD-10-CM

## 2013-12-20 DIAGNOSIS — I10 Essential (primary) hypertension: Secondary | ICD-10-CM

## 2013-12-20 DIAGNOSIS — E785 Hyperlipidemia, unspecified: Secondary | ICD-10-CM

## 2013-12-20 DIAGNOSIS — R5381 Other malaise: Secondary | ICD-10-CM

## 2013-12-20 DIAGNOSIS — IMO0001 Reserved for inherently not codable concepts without codable children: Secondary | ICD-10-CM

## 2013-12-20 LAB — POCT URINALYSIS DIPSTICK
BILIRUBIN UA: NEGATIVE
Blood, UA: NEGATIVE
Glucose, UA: NEGATIVE
KETONES UA: NEGATIVE
Leukocytes, UA: NEGATIVE
Nitrite, UA: NEGATIVE
PH UA: 5
Protein, UA: NEGATIVE
Spec Grav, UA: 1.015
Urobilinogen, UA: 0.2

## 2013-12-20 LAB — CBC WITH DIFFERENTIAL/PLATELET
Basophils Absolute: 0 10*3/uL (ref 0.0–0.1)
Basophils Relative: 0.4 % (ref 0.0–3.0)
EOS PCT: 1.2 % (ref 0.0–5.0)
Eosinophils Absolute: 0.1 10*3/uL (ref 0.0–0.7)
HEMATOCRIT: 39.3 % (ref 36.0–46.0)
Hemoglobin: 13.2 g/dL (ref 12.0–15.0)
LYMPHS ABS: 2.4 10*3/uL (ref 0.7–4.0)
Lymphocytes Relative: 29.9 % (ref 12.0–46.0)
MCHC: 33.5 g/dL (ref 30.0–36.0)
MCV: 90.7 fl (ref 78.0–100.0)
MONOS PCT: 5.8 % (ref 3.0–12.0)
Monocytes Absolute: 0.5 10*3/uL (ref 0.1–1.0)
Neutro Abs: 5.1 10*3/uL (ref 1.4–7.7)
Neutrophils Relative %: 62.7 % (ref 43.0–77.0)
Platelets: 368 10*3/uL (ref 150.0–400.0)
RBC: 4.33 Mil/uL (ref 3.87–5.11)
RDW: 13.7 % (ref 11.5–15.5)
WBC: 8.2 10*3/uL (ref 4.0–10.5)

## 2013-12-20 LAB — LIPID PANEL
Cholesterol: 243 mg/dL — ABNORMAL HIGH (ref 0–200)
HDL: 47.1 mg/dL (ref >39.00)
LDL Cholesterol: 167 mg/dL — ABNORMAL HIGH (ref 0–99)
NONHDL: 195.9
Total CHOL/HDL Ratio: 5
Triglycerides: 143 mg/dL (ref 0.0–149.0)
VLDL: 28.6 mg/dL (ref 0.0–40.0)

## 2013-12-20 LAB — CK: Total CK: 341 U/L — ABNORMAL HIGH (ref 7–177)

## 2013-12-20 LAB — BASIC METABOLIC PANEL
BUN: 16 mg/dL (ref 6–23)
CHLORIDE: 106 meq/L (ref 96–112)
CO2: 25 mEq/L (ref 19–32)
Calcium: 9.9 mg/dL (ref 8.4–10.5)
Creatinine, Ser: 1.1 mg/dL (ref 0.4–1.2)
GFR: 68.49 mL/min (ref >60.00)
Glucose, Bld: 91 mg/dL (ref 70–99)
POTASSIUM: 3.4 meq/L — AB (ref 3.5–5.1)
SODIUM: 138 meq/L (ref 135–145)

## 2013-12-20 LAB — HEPATIC FUNCTION PANEL
ALBUMIN: 4.1 g/dL (ref 3.5–5.2)
ALK PHOS: 68 U/L (ref 39–117)
ALT: 21 U/L (ref 0–35)
AST: 20 U/L (ref 0–37)
Bilirubin, Direct: 0 mg/dL (ref 0.0–0.3)
Total Bilirubin: 0.7 mg/dL (ref 0.2–1.2)
Total Protein: 7.8 g/dL (ref 6.0–8.3)

## 2013-12-20 LAB — VITAMIN B12: Vitamin B-12: 545 pg/mL (ref 211–911)

## 2013-12-20 LAB — TSH: TSH: 0.34 u[IU]/mL — ABNORMAL LOW (ref 0.35–4.50)

## 2013-12-23 LAB — VITAMIN D 1,25 DIHYDROXY
Vitamin D 1, 25 (OH)2 Total: 30 pg/mL (ref 18–72)
Vitamin D2 1, 25 (OH)2: <8 pg/mL
Vitamin D3 1, 25 (OH)2: 30 pg/mL

## 2014-01-02 ENCOUNTER — Telehealth: Payer: Self-pay | Admitting: Family Medicine

## 2014-01-02 NOTE — Telephone Encounter (Signed)
Caller name:Nidia Relation to YT:WKMQ Call back number:[phone] Pharmacy:  Reason for call: pt returned your call. She states you can leave a detailed message on voicemail

## 2014-01-03 NOTE — Telephone Encounter (Signed)
Pt called back.  She states she would like for you to leave a detailed message on her machine.

## 2014-01-05 ENCOUNTER — Telehealth: Payer: Self-pay | Admitting: *Deleted

## 2014-01-05 DIAGNOSIS — R748 Abnormal levels of other serum enzymes: Secondary | ICD-10-CM

## 2014-01-05 DIAGNOSIS — E0590 Thyrotoxicosis, unspecified without thyrotoxic crisis or storm: Secondary | ICD-10-CM

## 2014-01-05 NOTE — Telephone Encounter (Signed)
Routed to Angela Baynes, CMA.   

## 2014-01-05 NOTE — Telephone Encounter (Signed)
Spoke with the pt and informed her of recent lab results and note.  Pt asked questions regarding the TSH results, and I explained the results alittle more.  Pt verbalized understanding.  Informed the pt that I will mail her K+ rich foods.  Pt stated that she had been off the Lipitor 2 weeks prior to having recent labs drawn,so do we need to repeat the labs for the Lipitor.  And when do you want to repeat the thyroid test?  Please advise.//AB/CMA

## 2014-01-05 NOTE — Telephone Encounter (Signed)
If she has been off lipitor 2 weeks -- repeat cpk now and can do thyroid at same time

## 2014-01-05 NOTE — Telephone Encounter (Signed)
Message copied by Verdie Shire on Fri Jan 05, 2014 11:51 AM ------      Message from: Lelon Perla      Created: Mon Jan 01, 2014  5:26 PM       Stop Lipitor--- cpk high----  Repeat off lipitor in 2 weeks       tsh low-- hyperthyroid-- repeat with free t3, free t4, tsh  Dx hyperthyroid      K is low--- eat potassium rich foods ------

## 2014-01-08 NOTE — Telephone Encounter (Signed)
LMOM @ (9:32am) asking the pt to RTC regarding note below and to schedule lab appt.//AB/CMA

## 2014-01-08 NOTE — Telephone Encounter (Signed)
Informed the pt of Dr. Ernst Spell recommendation below.  Pt agreed and scheduled a lab appt for Tues (01-09-14 @ 11:00am).   Future labs ordered and sent.  Pt will also pick-up information on potassium rich foods.//AB/CMA

## 2014-01-09 ENCOUNTER — Telehealth: Payer: Self-pay | Admitting: Family Medicine

## 2014-01-09 ENCOUNTER — Other Ambulatory Visit (INDEPENDENT_AMBULATORY_CARE_PROVIDER_SITE_OTHER): Payer: Federal, State, Local not specified - PPO

## 2014-01-09 DIAGNOSIS — F329 Major depressive disorder, single episode, unspecified: Secondary | ICD-10-CM

## 2014-01-09 DIAGNOSIS — E0590 Thyrotoxicosis, unspecified without thyrotoxic crisis or storm: Secondary | ICD-10-CM

## 2014-01-09 DIAGNOSIS — R748 Abnormal levels of other serum enzymes: Secondary | ICD-10-CM

## 2014-01-09 DIAGNOSIS — F32A Depression, unspecified: Secondary | ICD-10-CM

## 2014-01-09 LAB — T3, FREE: T3, Free: 2.7 pg/mL (ref 2.3–4.2)

## 2014-01-09 LAB — T4, FREE: FREE T4: 1.05 ng/dL (ref 0.60–1.60)

## 2014-01-09 LAB — CK: Total CK: 341 U/L — ABNORMAL HIGH (ref 7–177)

## 2014-01-09 LAB — TSH: TSH: 0.56 u[IU]/mL (ref 0.35–4.50)

## 2014-01-09 MED ORDER — BUPROPION HCL ER (XL) 150 MG PO TB24: ORAL_TABLET | ORAL | Status: DC

## 2014-01-09 NOTE — Telephone Encounter (Signed)
Caller name: Ladeidra Relation to pt:  Call back number:[phone] Pharmacy: [facility]  Reason for call: pt filled out a Walk In Form;  Pt needs a refill on rx buPROPion (WELLBUTRIN XL) 150 MG 24 hr tablet ,  Please call pt when sent.

## 2014-01-09 NOTE — Telephone Encounter (Signed)
Rx sent,Unable to leave a message because the voicemail was full. Patient is due for a 1 mo follow up.    KP

## 2014-01-17 ENCOUNTER — Telehealth: Payer: Self-pay | Admitting: Family Medicine

## 2014-01-17 DIAGNOSIS — R748 Abnormal levels of other serum enzymes: Secondary | ICD-10-CM

## 2014-01-17 DIAGNOSIS — M791 Myalgia, unspecified site: Secondary | ICD-10-CM

## 2014-01-17 NOTE — Telephone Encounter (Signed)
Notes Recorded by Lelon Perla, DO on 01/09/2014 at 4:49 PM If myalgia no better-- refer to rheum -- elevated cpk and myalgias  Spoke with patient and made her aware of the results, she voiced understanding and the referral has been placed.     KP

## 2014-01-17 NOTE — Telephone Encounter (Signed)
Caller name: Carrie [name]  Relation to pt: self  Call back number: [phone] phone or [phone] work number    Reason for call: returning you call. inquiring about lab results

## 2014-03-08 ENCOUNTER — Telehealth: Payer: Self-pay | Admitting: Family Medicine

## 2014-03-08 DIAGNOSIS — I10 Essential (primary) hypertension: Secondary | ICD-10-CM

## 2014-03-08 DIAGNOSIS — E785 Hyperlipidemia, unspecified: Secondary | ICD-10-CM

## 2014-03-08 NOTE — Telephone Encounter (Signed)
Caller name: Jodel  Relation to pt: self  Call back number: [phone]   Reason for call:   pt cholesterol level is high patient would like to know if she can have lab work. Please advise

## 2014-03-08 NOTE — Telephone Encounter (Signed)
Please advise on which follow up labs you would like.     KP

## 2014-03-08 NOTE — Telephone Encounter (Signed)
Lipid, hep, bmp-----htn, hyperlipidemia

## 2014-03-09 NOTE — Telephone Encounter (Signed)
Orders in.  please schedule.      KP

## 2014-03-09 NOTE — Telephone Encounter (Signed)
lvm advising pt to c/b to schedule fasting lab work.

## 2014-03-10 ENCOUNTER — Other Ambulatory Visit: Payer: Self-pay | Admitting: Family Medicine

## 2014-03-14 ENCOUNTER — Other Ambulatory Visit (INDEPENDENT_AMBULATORY_CARE_PROVIDER_SITE_OTHER): Payer: Federal, State, Local not specified - PPO

## 2014-03-14 DIAGNOSIS — E785 Hyperlipidemia, unspecified: Secondary | ICD-10-CM

## 2014-03-14 DIAGNOSIS — I10 Essential (primary) hypertension: Secondary | ICD-10-CM

## 2014-03-14 LAB — HEPATIC FUNCTION PANEL
ALT: 23 U/L (ref 0–35)
AST: 19 U/L (ref 0–37)
Albumin: 3.8 g/dL (ref 3.5–5.2)
Alkaline Phosphatase: 67 U/L (ref 39–117)
BILIRUBIN DIRECT: 0 mg/dL (ref 0.0–0.3)
BILIRUBIN TOTAL: 0.8 mg/dL (ref 0.2–1.2)
Total Protein: 8.4 g/dL — ABNORMAL HIGH (ref 6.0–8.3)

## 2014-03-14 LAB — BASIC METABOLIC PANEL
BUN: 16 mg/dL (ref 6–23)
CO2: 22 meq/L (ref 19–32)
Calcium: 10 mg/dL (ref 8.4–10.5)
Chloride: 105 mEq/L (ref 96–112)
Creatinine, Ser: 1.1 mg/dL (ref 0.4–1.2)
GFR: 64.91 mL/min (ref >60.00)
GLUCOSE: 102 mg/dL — AB (ref 70–99)
POTASSIUM: 4.5 meq/L (ref 3.5–5.1)
Sodium: 136 mEq/L (ref 135–145)

## 2014-03-14 LAB — LIPID PANEL
Cholesterol: 285 mg/dL — ABNORMAL HIGH (ref 0–200)
HDL: 47.7 mg/dL (ref >39.00)
LDL CALC: 212 mg/dL — AB (ref 0–99)
NONHDL: 237.3
Total CHOL/HDL Ratio: 6
Triglycerides: 129 mg/dL (ref 0.0–149.0)
VLDL: 25.8 mg/dL (ref 0.0–40.0)

## 2014-03-15 ENCOUNTER — Telehealth: Payer: Self-pay

## 2014-03-15 NOTE — Telephone Encounter (Signed)
OK JUST AVOID TRANS FATS AND SIMPLE CARBS

## 2014-03-15 NOTE — Telephone Encounter (Signed)
Message copied by Arnette Norris on Thu Mar 15, 2014  2:40 PM ------      Message from: Danise Edge A      Created: Thu Mar 15, 2014  7:36 AM       Find out if she is taking her Lipitor regularly her numbers are up. If she is taking then she needs her Atorvastatin to 40 mg po qhs disp #30 with 3 rf or #90 with 1 rf at patient discretion. She needs repeat labs and an appt in 3 months ------

## 2014-03-15 NOTE — Telephone Encounter (Signed)
On 01/01/2014 Dr. Laury Axon had the patient stop the Lipitor because her CPK was high, when she can in for the repeat it was still high and Dr.Lowne sent her to the Rheumatologist. I called to have their notes faxed to review. When I spoke with the patient she stated no one ever told her to go back on the medication so she is not taking it, she wants to be sure we had the notes from the specialist before we put her on something else. Once the fax comes in , I will forward to Dr.Blyth to review.   KP

## 2014-03-16 NOTE — Telephone Encounter (Signed)
Detailed message left advising of Dr.Blyth's recommendations and a return call if any questions or concerns.     KP

## 2014-08-12 ENCOUNTER — Ambulatory Visit (INDEPENDENT_AMBULATORY_CARE_PROVIDER_SITE_OTHER): Payer: Federal, State, Local not specified - PPO | Admitting: Emergency Medicine

## 2014-08-12 VITALS — BP 114/80 | HR 96 | Temp 97.9°F | Resp 20 | Ht 64.25 in | Wt 180.4 lb

## 2014-08-12 DIAGNOSIS — Z0000 Encounter for general adult medical examination without abnormal findings: Secondary | ICD-10-CM

## 2014-08-12 NOTE — Patient Instructions (Signed)

## 2014-08-12 NOTE — Progress Notes (Signed)
[facility] [address] [place] [address] [phone]  Date:  08/12/2014   Name:  Carrie [name]   DOB:  Apr 16, 1961   MRN:  [id number]  PCP:  Loreen Freud, DO    Chief Complaint: Annual Exam   History of Present Illness:  Carrie [name] is a 54 y.o. very pleasant female patient who presents with the following:  Annual wellness examination Has HLD and HBP Denies other complaint or health concern today.   Patient Active Problem List   Diagnosis Date Noted  . BACK PAIN 04/28/2010  . OTHER ACUTE REACTIONS TO STRESS 03/26/2010  . HOT FLASHES 01/17/2010  . FATIGUE 02/02/2008  . HYPERLIPIDEMIA 08/18/2007  . HYPERTENSION 02/25/2007  . Unspecified Chest Pain 02/25/2007  . REDUCTION MAMMOPLASTY, HX OF 02/25/2007    Past Medical History  Diagnosis Date  . Hyperlipidemia   . Hypertension     Past Surgical History  Procedure Laterality Date  . Abdominal hysterectomy      History  Substance Use Topics  . Smoking status: Former Smoker    Types: Cigarettes    Quit date: 02/05/2014  . Smokeless tobacco: Never Used     Comment: 1 cig every 4-5 days ---weaning off  . Alcohol Use: 0.0 oz/week    0 Standard drinks or equivalent per week    Family History  Problem Relation Age of Onset  . Breast cancer    . Coronary artery disease    . Diabetes Sister   . Hypertension      No Known Allergies  Medication list has been reviewed and updated.  Current Outpatient Prescriptions on File Prior to Visit  Medication Sig Dispense Refill  . amLODipine (NORVASC) 5 MG tablet Take 1 tablet (5 mg total) by mouth daily. 90 tablet 1  . atorvastatin (LIPITOR) 20 MG tablet TAKE ONE TABLET AT BEDTIME. 30 tablet 5  . lisinopril (PRINIVIL,ZESTRIL) 20 MG tablet Take 1 tablet (20 mg total) by mouth daily. 90 tablet 1  . buPROPion (WELLBUTRIN XL) 150 MG 24 hr tablet TAKE (2) TABLETS DAILY. (Patient not taking: Reported on 08/12/2014) 60 tablet 5   No current  facility-administered medications on file prior to visit.    Review of Systems:  As per HPI, otherwise negative.    Physical Examination: Filed Vitals:   08/12/14 1400  BP: 114/80  Pulse: 96  Temp: 97.9 F (36.6 C)  Resp: 20   Filed Vitals:   08/12/14 1400  Height: 5' 4.25" (1.632 m)  Weight: 180 lb 6 oz (81.818 kg)   Body mass index is 30.72 kg/(m^2). Ideal Body Weight: Weight in (lb) to have BMI = 25: 146.5  GEN: WDWN, NAD, Non-toxic, A & O x 3 HEENT: Atraumatic, Normocephalic. Neck supple. No masses, No LAD. Ears and Nose: No external deformity. CV: RRR, No M/G/R. No JVD. No thrill. No extra heart sounds. PULM: CTA B, no wheezes, crackles, rhonchi. No retractions. No resp. distress. No accessory muscle use. ABD: S, NT, ND, +BS. No rebound. No HSM. EXTR: No c/c/e NEURO Normal gait.  PSYCH: Normally interactive. Conversant. Not depressed or anxious appearing.  Calm demeanor.    Assessment and Plan: Annual examination  Labs by FMD  Signed,  Phillips Odor, MD

## 2014-08-21 ENCOUNTER — Other Ambulatory Visit: Payer: Self-pay | Admitting: Family Medicine

## 2014-08-23 ENCOUNTER — Telehealth: Payer: Self-pay | Admitting: Family Medicine

## 2014-08-23 NOTE — Telephone Encounter (Signed)
Pre visit CPE letter sent °

## 2014-09-10 ENCOUNTER — Encounter: Payer: Federal, State, Local not specified - PPO | Admitting: Family Medicine

## 2014-10-28 ENCOUNTER — Other Ambulatory Visit: Payer: Self-pay | Admitting: Family Medicine

## 2014-10-29 NOTE — Telephone Encounter (Signed)
Medications have not been sent. This patient is due for an office visit with PCP per protocol. Please offer her an apt and send back to me.     KP

## 2014-11-19 NOTE — Telephone Encounter (Signed)
Caller name: Taneeka Curtner Relationship to patient: self Can be reached: [phone] [facility] [address]  Reason for call: Pt scheduled appt for 11/29/14 - she will be out today of both meds.

## 2014-11-19 NOTE — Telephone Encounter (Signed)
Rx faxed.    KP 

## 2014-11-29 ENCOUNTER — Encounter: Payer: Self-pay | Admitting: Family Medicine

## 2014-11-29 ENCOUNTER — Ambulatory Visit (INDEPENDENT_AMBULATORY_CARE_PROVIDER_SITE_OTHER): Payer: Federal, State, Local not specified - PPO | Admitting: Family Medicine

## 2014-11-29 VITALS — BP 122/84 | HR 76 | Temp 98.8°F | Wt 180.6 lb

## 2014-11-29 DIAGNOSIS — R739 Hyperglycemia, unspecified: Secondary | ICD-10-CM

## 2014-11-29 DIAGNOSIS — E785 Hyperlipidemia, unspecified: Secondary | ICD-10-CM | POA: Diagnosis not present

## 2014-11-29 DIAGNOSIS — F329 Major depressive disorder, single episode, unspecified: Secondary | ICD-10-CM

## 2014-11-29 DIAGNOSIS — I10 Essential (primary) hypertension: Secondary | ICD-10-CM

## 2014-11-29 DIAGNOSIS — F32A Depression, unspecified: Secondary | ICD-10-CM

## 2014-11-29 LAB — HEPATIC FUNCTION PANEL
ALK PHOS: 82 U/L (ref 39–117)
ALT: 21 U/L (ref 0–35)
AST: 20 U/L (ref 0–37)
Albumin: 4.4 g/dL (ref 3.5–5.2)
BILIRUBIN DIRECT: 0.2 mg/dL (ref 0.0–0.3)
BILIRUBIN TOTAL: 0.8 mg/dL (ref 0.2–1.2)
TOTAL PROTEIN: 7.6 g/dL (ref 6.0–8.3)

## 2014-11-29 LAB — BASIC METABOLIC PANEL
BUN: 11 mg/dL (ref 6–23)
CALCIUM: 9.9 mg/dL (ref 8.4–10.5)
CHLORIDE: 107 meq/L (ref 96–112)
CO2: 26 meq/L (ref 19–32)
CREATININE: 1.09 mg/dL (ref 0.40–1.20)
GFR: 66.81 mL/min (ref >60.00)
GLUCOSE: 81 mg/dL (ref 70–99)
Potassium: 4.2 mEq/L (ref 3.5–5.1)
Sodium: 137 mEq/L (ref 135–145)

## 2014-11-29 LAB — LIPID PANEL
CHOL/HDL RATIO: 3
Cholesterol: 192 mg/dL (ref 0–200)
HDL: 57.3 mg/dL (ref >39.00)
LDL Cholesterol: 124 mg/dL — ABNORMAL HIGH (ref 0–99)
NonHDL: 134.7
TRIGLYCERIDES: 54 mg/dL (ref 0.0–149.0)
VLDL: 10.8 mg/dL (ref 0.0–40.0)

## 2014-11-29 LAB — HEMOGLOBIN A1C: HEMOGLOBIN A1C: 6 % (ref 4.6–6.5)

## 2014-11-29 MED ORDER — BUPROPION HCL ER (XL) 150 MG PO TB24: ORAL_TABLET | ORAL | Status: DC

## 2014-11-29 MED ORDER — ATORVASTATIN CALCIUM 20 MG PO TABS: ORAL_TABLET | ORAL | Status: DC

## 2014-11-29 MED ORDER — AMLODIPINE BESYLATE 5 MG PO TABS: 5.0000 mg | ORAL_TABLET | Freq: Every day | ORAL | Status: DC

## 2014-11-29 MED ORDER — LISINOPRIL 20 MG PO TABS: 20.0000 mg | ORAL_TABLET | Freq: Every day | ORAL | Status: DC

## 2014-11-29 NOTE — Progress Notes (Signed)
Pre visit review using our clinic review tool, if applicable. No additional management support is needed unless otherwise documented below in the visit note. 

## 2014-11-29 NOTE — Patient Instructions (Signed)

## 2014-11-29 NOTE — Progress Notes (Signed)
Patient ID: Carrie [name], female    DOB: 03/03/1961  Age: 54 y.o. MRN: [id number]    Subjective:  Subjective HPI Carrie [name] presents for f/u bp and cholesterol.  She also needs refills on antidepressants.    Review of Systems  Constitutional: Negative for activity change, appetite change, fatigue and unexpected weight change.  Respiratory: Negative for cough and shortness of breath.   Cardiovascular: Negative for chest pain and palpitations.  Psychiatric/Behavioral: Negative for behavioral problems and dysphoric mood. The patient is not nervous/anxious.     History Past Medical History  Diagnosis Date  . Hyperlipidemia   . Hypertension     She has past surgical history that includes Abdominal hysterectomy.   Her family history includes Breast cancer in an other family member; Coronary artery disease in an other family member; Diabetes in her sister; Hypertension in an other family member.She reports that she quit smoking about 9 months ago. Her smoking use included Cigarettes. She has never used smokeless tobacco. She reports that she drinks alcohol. She reports that she does not use illicit drugs.  No current outpatient prescriptions on file prior to visit.   No current facility-administered medications on file prior to visit.     Objective:  Objective Physical Exam  Constitutional: She is oriented to person, place, and time. She appears well-developed and well-nourished.  HENT:  Head: Normocephalic and atraumatic.  Eyes: Conjunctivae and EOM are normal.  Neck: Normal range of motion. Neck supple. No JVD present. Carotid bruit is not present. No thyromegaly present.  Cardiovascular: Normal rate, regular rhythm and normal heart sounds.   No murmur heard. Pulmonary/Chest: Effort normal and breath sounds normal. No respiratory distress. She has no wheezes. She has no rales. She exhibits no tenderness.  Musculoskeletal: She exhibits no edema.  Neurological: She is alert  and oriented to person, place, and time.  Psychiatric: She has a normal mood and affect. Her behavior is normal.   BP 122/84 mmHg  Pulse 76  Temp(Src) 98.8 F (37.1 C) (Oral)  Wt 180 lb 9.6 oz (81.92 kg)  SpO2 97% Wt Readings from Last 3 Encounters:  11/29/14 180 lb 9.6 oz (81.92 kg)  08/12/14 180 lb 6 oz (81.818 kg)  12/18/13 190 lb (86.183 kg)     Lab Results  Component Value Date   WBC 8.2 12/20/2013   HGB 13.2 12/20/2013   HCT 39.3 12/20/2013   PLT 368.0 12/20/2013   GLUCOSE 102* 03/14/2014   CHOL 285* 03/14/2014   TRIG 129.0 03/14/2014   HDL 47.70 03/14/2014   LDLDIRECT 213.8 08/29/2012   LDLCALC 212* 03/14/2014   ALT 23 03/14/2014   AST 19 03/14/2014   NA 136 03/14/2014   K 4.5 03/14/2014   CL 105 03/14/2014   CREATININE 1.1 03/14/2014   BUN 16 03/14/2014   CO2 22 03/14/2014   TSH 0.56 01/09/2014    Mm Digital Screening Bilateral  11/08/2013   CLINICAL DATA:  Screening.  EXAM: DIGITAL SCREENING BILATERAL MAMMOGRAM WITH CAD  COMPARISON:  Previous exam(s)  ACR Breast Density Category a: The breast tissue is almost entirely fatty.  FINDINGS: There are no findings suspicious for malignancy. Images were processed with CAD.  IMPRESSION: No mammographic evidence of malignancy. A result letter of this screening mammogram will be mailed directly to the patient.  RECOMMENDATION: Screening mammogram in one year. (Code:SM-B-01Y)  BI-RADS CATEGORY  1: Negative.   Electronically Signed   By: Vincenza Hews M.D.   On: 11/08/2013 11:07  Assessment & Plan:  Plan I have changed Carrie [name]'s lisinopril and amLODipine. I am also having her maintain her buPROPion and atorvastatin.  Meds ordered this encounter  Medications  . lisinopril (PRINIVIL,ZESTRIL) 20 MG tablet    Sig: Take 1 tablet (20 mg total) by mouth daily.    Dispense:  90 tablet    Refill:  1    D/C PREVIOUS SCRIPTS FOR THIS MEDICATION  . buPROPion (WELLBUTRIN XL) 150 MG 24 hr tablet    Sig: TAKE (2) TABLETS  DAILY.    Dispense:  180 tablet    Refill:  1    D/C PREVIOUS SCRIPTS FOR THIS MEDICATION  . atorvastatin (LIPITOR) 20 MG tablet    Sig: TAKE ONE TABLET AT BEDTIME.    Dispense:  90 tablet    Refill:  1    D/C PREVIOUS SCRIPTS FOR THIS MEDICATION  . amLODipine (NORVASC) 5 MG tablet    Sig: Take 1 tablet (5 mg total) by mouth daily.    Dispense:  90 tablet    Refill:  1    D/C PREVIOUS SCRIPTS FOR THIS MEDICATION    Problem List Items Addressed This Visit    None    Visit Diagnoses    Essential hypertension    -  Primary    Relevant Medications    lisinopril (PRINIVIL,ZESTRIL) 20 MG tablet    atorvastatin (LIPITOR) 20 MG tablet    amLODipine (NORVASC) 5 MG tablet    Other Relevant Orders    Basic metabolic panel    Hyperlipidemia LDL goal <100        Relevant Medications    lisinopril (PRINIVIL,ZESTRIL) 20 MG tablet    atorvastatin (LIPITOR) 20 MG tablet    amLODipine (NORVASC) 5 MG tablet    Other Relevant Orders    Hepatic function panel    Lipid panel    Depression        Relevant Medications    buPROPion (WELLBUTRIN XL) 150 MG 24 hr tablet       Follow-up: Return in about 6 months (around 05/31/2015), or if symptoms worsen or fail to improve, for annual exam, fasting.  Carrie Freud, DO

## 2015-07-11 ENCOUNTER — Encounter: Payer: Self-pay | Admitting: Medical

## 2015-07-11 ENCOUNTER — Ambulatory Visit (INDEPENDENT_AMBULATORY_CARE_PROVIDER_SITE_OTHER): Payer: Federal, State, Local not specified - PPO | Admitting: Medical

## 2015-07-11 VITALS — BP 116/78 | HR 71 | Temp 98.1°F | Ht 64.25 in | Wt 184.0 lb

## 2015-07-11 DIAGNOSIS — J309 Allergic rhinitis, unspecified: Secondary | ICD-10-CM | POA: Diagnosis not present

## 2015-07-11 DIAGNOSIS — L299 Pruritus, unspecified: Secondary | ICD-10-CM

## 2015-07-11 MED ORDER — LEVOCETIRIZINE DIHYDROCHLORIDE 5 MG PO TABS: 5.0000 mg | ORAL_TABLET | Freq: Every evening | ORAL | Status: DC

## 2015-07-11 MED ORDER — METHYLPREDNISOLONE ACETATE 40 MG/ML IJ SUSP
40.0000 mg | Freq: Once | INTRAMUSCULAR | Status: AC
  Administered 2015-07-11: 40 mg via INTRAMUSCULAR

## 2015-07-11 NOTE — Progress Notes (Signed)
Pre visit review using our clinic review tool, if applicable. No additional management support is needed unless otherwise documented below in the visit note. 

## 2015-07-11 NOTE — Addendum Note (Signed)
Addended by: Neldon Labella on: 07/11/2015 03:37 PM   Modules accepted: Orders

## 2015-07-11 NOTE — Progress Notes (Signed)
Subjective:    Patient ID: Carrie [name], female    DOB: 06/28/1960, 55 y.o.   MRN: [id number]  HPI   Pt in with some recent itching of her skin. Pt states this has been going on for years. This has been on occurring about one time a year for years(usually in spring). Pt states she has had allergy testing and she was sensitive to a lot. Pt states in past with skin itching would not get rash. But also with skin itchingpast her eyes would itch and burn. Recent mild itching to eyes. Also  has some recent sneezing and some nasal congestion for 10 days or so. No runny nose. Pt desires steroid injection  Recently her symptoms for about 10 days. Pt tried claritin and benadryl recently not helping much.  Most of time she has allergy symptoms in the spring.  Pt also has dry skin.   Review of Systems  Constitutional: Negative for fever, chills and fatigue.  HENT: Positive for congestion and sneezing. Negative for ear discharge, facial swelling and postnasal drip.   Respiratory: Negative for cough, chest tightness, shortness of breath and wheezing.   Cardiovascular: Negative for chest pain and palpitations.  Musculoskeletal: Negative for back pain.  Skin:       Skin itching.  Neurological: Negative for dizziness, speech difficulty, weakness, numbness and headaches.  Hematological: Negative for adenopathy. Does not bruise/bleed easily.  Psychiatric/Behavioral: Negative for behavioral problems and confusion.     Past Medical History  Diagnosis Date  . Hyperlipidemia   . Hypertension     Social History   Social History  . Marital Status: Single    Spouse Name: N/A  . Number of Children: N/A  . Years of Education: N/A   Occupational History  . Not on file.   Social History Main Topics  . Smoking status: Former Smoker    Types: Cigarettes    Quit date: 02/05/2014  . Smokeless tobacco: Never Used     Comment: 1 cig every 4-5 days ---weaning off  . Alcohol Use: 0.0 oz/week    0  Standard drinks or equivalent per week  . Drug Use: No  . Sexual Activity:    Partners: Male   Other Topics Concern  . Not on file   Social History Narrative   Exercise --rare    Past Surgical History  Procedure Laterality Date  . Abdominal hysterectomy      Family History  Problem Relation Age of Onset  . Breast cancer    . Coronary artery disease    . Diabetes Sister   . Hypertension      No Known Allergies  Current Outpatient Prescriptions on File Prior to Visit  Medication Sig Dispense Refill  . amLODipine (NORVASC) 5 MG tablet Take 1 tablet (5 mg total) by mouth daily. 90 tablet 1  . atorvastatin (LIPITOR) 20 MG tablet TAKE ONE TABLET AT BEDTIME. 90 tablet 1  . buPROPion (WELLBUTRIN XL) 150 MG 24 hr tablet TAKE (2) TABLETS DAILY. 180 tablet 1  . lisinopril (PRINIVIL,ZESTRIL) 20 MG tablet Take 1 tablet (20 mg total) by mouth daily. 90 tablet 1   No current facility-administered medications on file prior to visit.    BP 116/78 mmHg  Pulse 71  Temp(Src) 98.1 F (36.7 C) (Oral)  Ht 5' 4.25" (1.632 m)  Wt 184 lb (83.462 kg)  BMI 31.34 kg/m2  SpO2 98%       Objective:   Physical Exam  General  Mental Status - Alert. General Appearance - Well groomed. Not in acute distress.  Skin Rashes- No Rashes.  HEENT Head- Normal. Ear Auditory Canal - Left- Normal. Right - Normal.Tympanic Membrane- Left- Normal. Right- Normal. Eye Sclera/Conjunctiva- Left- Normal. Right- Normal. Nose & Sinuses Nasal Mucosa- Left-  Boggy and Congested. Right-  Boggy and  Congested.Bilateral  No maxillary and no frontal sinus pressure. Mouth & Throat Lips: Upper Lip- Normal: no dryness, cracking, pallor, cyanosis, or vesicular eruption. Lower Lip-Normal: no dryness, cracking, pallor, cyanosis or vesicular eruption. Buccal Mucosa- Bilateral- No Aphthous ulcers. Oropharynx- No Discharge or Erythema. Tonsils: Characteristics- Bilateral- No Erythema or Congestion. Size/Enlargement-  Bilateral- No enlargement. Discharge- bilateral-None.  Neck Neck- Supple. No Masses.   Chest and Lung Exam Auscultation: Breath Sounds:-Clear even and unlabored.  Cardiovascular Auscultation:Rythm- Regular, rate and rhythm. Murmurs & Other Heart Sounds:Ausculatation of the heart reveal- No Murmurs.  Lymphatic Head & Neck General Head & Neck Lymphatics: Bilateral: Description- No Localized lymphadenopathy.   Skin- no rash presently.       Assessment & Plan:  For allergic rhinitis that not responded to otc antihistamines will give depomedrol 40 mg im.  Also rx xyzal antihistamine.(stop other antihistamines currently using)  For hx of dry skin and skin itching advise daily moisturizing lotion such as lubriderm or aveeno. If you can find one with vitamin E that may be helpful.  Follow up in 2-3 weeks or as needed

## 2015-07-11 NOTE — Patient Instructions (Addendum)
For allergic rhinitis that not responded to otc antihistamines will give depomedrol 40 mg im.  Also rx xyzal antihistamine.(stop other antihistamines currently using)  For hx of dry skin and skin itching advise daily moisturizing lotion such as lubriderm or aveeno. If you can find one with vitamin E that may be helpful.  Follow up in 2-3 weeks or as needed

## 2015-09-28 ENCOUNTER — Other Ambulatory Visit: Payer: Self-pay | Admitting: Family Medicine

## 2015-09-30 NOTE — Telephone Encounter (Signed)
Please schedule this patient an appointment with Dr.Lowne for a CPE.    KP

## 2015-10-01 NOTE — Telephone Encounter (Signed)
LVM advising patient of message below °

## 2015-10-08 DIAGNOSIS — K080 Exfoliation of teeth due to systemic causes: Secondary | ICD-10-CM | POA: Diagnosis not present

## 2015-11-01 ENCOUNTER — Telehealth: Payer: Self-pay | Admitting: Behavioral Health

## 2015-11-01 NOTE — Telephone Encounter (Signed)
Unable to reach patient at time of Pre-Visit Call.  Left message for patient to return call when available.    

## 2015-11-05 ENCOUNTER — Encounter: Payer: Self-pay | Admitting: Family Medicine

## 2015-11-05 ENCOUNTER — Ambulatory Visit (INDEPENDENT_AMBULATORY_CARE_PROVIDER_SITE_OTHER): Payer: Federal, State, Local not specified - PPO | Admitting: Family Medicine

## 2015-11-05 VITALS — BP 112/60 | HR 99 | Temp 97.9°F | Ht 64.0 in | Wt 184.0 lb

## 2015-11-05 DIAGNOSIS — I10 Essential (primary) hypertension: Secondary | ICD-10-CM

## 2015-11-05 DIAGNOSIS — E785 Hyperlipidemia, unspecified: Secondary | ICD-10-CM | POA: Diagnosis not present

## 2015-11-05 DIAGNOSIS — Z1159 Encounter for screening for other viral diseases: Secondary | ICD-10-CM

## 2015-11-05 DIAGNOSIS — Z0000 Encounter for general adult medical examination without abnormal findings: Secondary | ICD-10-CM

## 2015-11-05 NOTE — Patient Instructions (Signed)
Preventive Care for Adults, Female A healthy lifestyle and preventive care can promote health and wellness. Preventive health guidelines for women include the following key practices.  A routine yearly physical is a good way to check with your health care provider about your health and preventive screening. It is a chance to share any concerns and updates on your health and to receive a thorough exam.  Visit your dentist for a routine exam and preventive care every 6 months. Brush your teeth twice a day and floss once a day. Good oral hygiene prevents tooth decay and gum disease.  The frequency of eye exams is based on your age, health, family medical history, use of contact lenses, and other factors. Follow your health care provider's recommendations for frequency of eye exams.  Eat a healthy diet. Foods like vegetables, fruits, whole grains, low-fat dairy products, and lean protein foods contain the nutrients you need without too many calories. Decrease your intake of foods high in solid fats, added sugars, and salt. Eat the right amount of calories for you.Get information about a proper diet from your health care provider, if necessary.  Regular physical exercise is one of the most important things you can do for your health. Most adults should get at least 150 minutes of moderate-intensity exercise (any activity that increases your heart rate and causes you to sweat) each week. In addition, most adults need muscle-strengthening exercises on 2 or more days a week.  Maintain a healthy weight. The body mass index (BMI) is a screening tool to identify possible weight problems. It provides an estimate of body fat based on height and weight. Your health care provider can find your BMI and can help you achieve or maintain a healthy weight.For adults 20 years and older:  A BMI below 18.5 is considered underweight.  A BMI of 18.5 to 24.9 is normal.  A BMI of 25 to 29.9 is considered overweight.  A  BMI of 30 and above is considered obese.  Maintain normal blood lipids and cholesterol levels by exercising and minimizing your intake of saturated fat. Eat a balanced diet with plenty of fruit and vegetables. Blood tests for lipids and cholesterol should begin at age 45 and be repeated every 5 years. If your lipid or cholesterol levels are high, you are over 50, or you are at high risk for heart disease, you may need your cholesterol levels checked more frequently.Ongoing high lipid and cholesterol levels should be treated with medicines if diet and exercise are not working.  If you smoke, find out from your health care provider how to quit. If you do not use tobacco, do not start.  Lung cancer screening is recommended for adults aged 45-80 years who are at high risk for developing lung cancer because of a history of smoking. A yearly low-dose CT scan of the lungs is recommended for people who have at least a 30-pack-year history of smoking and are a current smoker or have quit within the past 15 years. A pack year of smoking is smoking an average of 1 pack of cigarettes a day for 1 year (for example: 1 pack a day for 30 years or 2 packs a day for 15 years). Yearly screening should continue until the smoker has stopped smoking for at least 15 years. Yearly screening should be stopped for people who develop a health problem that would prevent them from having lung cancer treatment.  If you are pregnant, do not drink alcohol. If you are  breastfeeding, be very cautious about drinking alcohol. If you are not pregnant and choose to drink alcohol, do not have more than 1 drink per day. One drink is considered to be 12 ounces (355 mL) of beer, 5 ounces (148 mL) of wine, or 1.5 ounces (44 mL) of liquor.  Avoid use of street drugs. Do not share needles with anyone. Ask for help if you need support or instructions about stopping the use of drugs.  High blood pressure causes heart disease and increases the risk  of stroke. Your blood pressure should be checked at least every 1 to 2 years. Ongoing high blood pressure should be treated with medicines if weight loss and exercise do not work.  If you are 55-79 years old, ask your health care provider if you should take aspirin to prevent strokes.  Diabetes screening is done by taking a blood sample to check your blood glucose level after you have not eaten for a certain period of time (fasting). If you are not overweight and you do not have risk factors for diabetes, you should be screened once every 3 years starting at age 45. If you are overweight or obese and you are 40-70 years of age, you should be screened for diabetes every year as part of your cardiovascular risk assessment.  Breast cancer screening is essential preventive care for women. You should practice "breast self-awareness." This means understanding the normal appearance and feel of your breasts and may include breast self-examination. Any changes detected, no matter how small, should be reported to a health care provider. Women in their 20s and 30s should have a clinical breast exam (CBE) by a health care provider as part of a regular health exam every 1 to 3 years. After age 40, women should have a CBE every year. Starting at age 40, women should consider having a mammogram (breast X-ray test) every year. Women who have a family history of breast cancer should talk to their health care provider about genetic screening. Women at a high risk of breast cancer should talk to their health care providers about having an MRI and a mammogram every year.  Breast cancer gene (BRCA)-related cancer risk assessment is recommended for women who have family members with BRCA-related cancers. BRCA-related cancers include breast, ovarian, tubal, and peritoneal cancers. Having family members with these cancers may be associated with an increased risk for harmful changes (mutations) in the breast cancer genes BRCA1 and  BRCA2. Results of the assessment will determine the need for genetic counseling and BRCA1 and BRCA2 testing.  Your health care provider may recommend that you be screened regularly for cancer of the pelvic organs (ovaries, uterus, and vagina). This screening involves a pelvic examination, including checking for microscopic changes to the surface of your cervix (Pap test). You may be encouraged to have this screening done every 3 years, beginning at age 21.  For women ages 30-65, health care providers may recommend pelvic exams and Pap testing every 3 years, or they may recommend the Pap and pelvic exam, combined with testing for human papilloma virus (HPV), every 5 years. Some types of HPV increase your risk of cervical cancer. Testing for HPV may also be done on women of any age with unclear Pap test results.  Other health care providers may not recommend any screening for nonpregnant women who are considered low risk for pelvic cancer and who do not have symptoms. Ask your health care provider if a screening pelvic exam is right for   you.  If you have had past treatment for cervical cancer or a condition that could lead to cancer, you need Pap tests and screening for cancer for at least 20 years after your treatment. If Pap tests have been discontinued, your risk factors (such as having a new sexual partner) need to be reassessed to determine if screening should resume. Some women have medical problems that increase the chance of getting cervical cancer. In these cases, your health care provider may recommend more frequent screening and Pap tests.  Colorectal cancer can be detected and often prevented. Most routine colorectal cancer screening begins at the age of 50 years and continues through age 75 years. However, your health care provider may recommend screening at an earlier age if you have risk factors for colon cancer. On a yearly basis, your health care provider may provide home test kits to check  for hidden blood in the stool. Use of a small camera at the end of a tube, to directly examine the colon (sigmoidoscopy or colonoscopy), can detect the earliest forms of colorectal cancer. Talk to your health care provider about this at age 50, when routine screening begins. Direct exam of the colon should be repeated every 5-10 years through age 75 years, unless early forms of precancerous polyps or small growths are found.  People who are at an increased risk for hepatitis B should be screened for this virus. You are considered at high risk for hepatitis B if:  You were born in a country where hepatitis B occurs often. Talk with your health care provider about which countries are considered high risk.  Your parents were born in a high-risk country and you have not received a shot to protect against hepatitis B (hepatitis B vaccine).  You have HIV or AIDS.  You use needles to inject street drugs.  You live with, or have sex with, someone who has hepatitis B.  You get hemodialysis treatment.  You take certain medicines for conditions like cancer, organ transplantation, and autoimmune conditions.  Hepatitis C blood testing is recommended for all people born from 1945 through 1965 and any individual with known risks for hepatitis C.  Practice safe sex. Use condoms and avoid high-risk sexual practices to reduce the spread of sexually transmitted infections (STIs). STIs include gonorrhea, chlamydia, syphilis, trichomonas, herpes, HPV, and human immunodeficiency virus (HIV). Herpes, HIV, and HPV are viral illnesses that have no cure. They can result in disability, cancer, and death.  You should be screened for sexually transmitted illnesses (STIs) including gonorrhea and chlamydia if:  You are sexually active and are younger than 24 years.  You are older than 24 years and your health care provider tells you that you are at risk for this type of infection.  Your sexual activity has changed  since you were last screened and you are at an increased risk for chlamydia or gonorrhea. Ask your health care provider if you are at risk.  If you are at risk of being infected with HIV, it is recommended that you take a prescription medicine daily to prevent HIV infection. This is called preexposure prophylaxis (PrEP). You are considered at risk if:  You are sexually active and do not regularly use condoms or know the HIV status of your partner(s).  You take drugs by injection.  You are sexually active with a partner who has HIV.  Talk with your health care provider about whether you are at high risk of being infected with HIV. If   you choose to begin PrEP, you should first be tested for HIV. You should then be tested every 3 months for as long as you are taking PrEP.  Osteoporosis is a disease in which the bones lose minerals and strength with aging. This can result in serious bone fractures or breaks. The risk of osteoporosis can be identified using a bone density scan. Women ages 67 years and over and women at risk for fractures or osteoporosis should discuss screening with their health care providers. Ask your health care provider whether you should take a calcium supplement or vitamin D to reduce the rate of osteoporosis.  Menopause can be associated with physical symptoms and risks. Hormone replacement therapy is available to decrease symptoms and risks. You should talk to your health care provider about whether hormone replacement therapy is right for you.  Use sunscreen. Apply sunscreen liberally and repeatedly throughout the day. You should seek shade when your shadow is shorter than you. Protect yourself by wearing long sleeves, pants, a wide-brimmed hat, and sunglasses year round, whenever you are outdoors.  Once a month, do a whole body skin exam, using a mirror to look at the skin on your back. Tell your health care provider of new moles, moles that have irregular borders, moles that  are larger than a pencil eraser, or moles that have changed in shape or color.  Stay current with required vaccines (immunizations).  Influenza vaccine. All adults should be immunized every year.  Tetanus, diphtheria, and acellular pertussis (Td, Tdap) vaccine. Pregnant women should receive 1 dose of Tdap vaccine during each pregnancy. The dose should be obtained regardless of the length of time since the last dose. Immunization is preferred during the 27th-36th week of gestation. An adult who has not previously received Tdap or who does not know her vaccine status should receive 1 dose of Tdap. This initial dose should be followed by tetanus and diphtheria toxoids (Td) booster doses every 10 years. Adults with an unknown or incomplete history of completing a 3-dose immunization series with Td-containing vaccines should begin or complete a primary immunization series including a Tdap dose. Adults should receive a Td booster every 10 years.  Varicella vaccine. An adult without evidence of immunity to varicella should receive 2 doses or a second dose if she has previously received 1 dose. Pregnant females who do not have evidence of immunity should receive the first dose after pregnancy. This first dose should be obtained before leaving the health care facility. The second dose should be obtained 4-8 weeks after the first dose.  Human papillomavirus (HPV) vaccine. Females aged 13-26 years who have not received the vaccine previously should obtain the 3-dose series. The vaccine is not recommended for use in pregnant females. However, pregnancy testing is not needed before receiving a dose. If a female is found to be pregnant after receiving a dose, no treatment is needed. In that case, the remaining doses should be delayed until after the pregnancy. Immunization is recommended for any person with an immunocompromised condition through the age of 61 years if she did not get any or all doses earlier. During the  3-dose series, the second dose should be obtained 4-8 weeks after the first dose. The third dose should be obtained 24 weeks after the first dose and 16 weeks after the second dose.  Zoster vaccine. One dose is recommended for adults aged 30 years or older unless certain conditions are present.  Measles, mumps, and rubella (MMR) vaccine. Adults born  before 1957 generally are considered immune to measles and mumps. Adults born in 1957 or later should have 1 or more doses of MMR vaccine unless there is a contraindication to the vaccine or there is laboratory evidence of immunity to each of the three diseases. A routine second dose of MMR vaccine should be obtained at least 28 days after the first dose for students attending postsecondary schools, health care workers, or international travelers. People who received inactivated measles vaccine or an unknown type of measles vaccine during 1963-1967 should receive 2 doses of MMR vaccine. People who received inactivated mumps vaccine or an unknown type of mumps vaccine before 1979 and are at high risk for mumps infection should consider immunization with 2 doses of MMR vaccine. For females of childbearing age, rubella immunity should be determined. If there is no evidence of immunity, females who are not pregnant should be vaccinated. If there is no evidence of immunity, females who are pregnant should delay immunization until after pregnancy. Unvaccinated health care workers born before 1957 who lack laboratory evidence of measles, mumps, or rubella immunity or laboratory confirmation of disease should consider measles and mumps immunization with 2 doses of MMR vaccine or rubella immunization with 1 dose of MMR vaccine.  Pneumococcal 13-valent conjugate (PCV13) vaccine. When indicated, a person who is uncertain of his immunization history and has no record of immunization should receive the PCV13 vaccine. All adults 65 years of age and older should receive this  vaccine. An adult aged 19 years or older who has certain medical conditions and has not been previously immunized should receive 1 dose of PCV13 vaccine. This PCV13 should be followed with a dose of pneumococcal polysaccharide (PPSV23) vaccine. Adults who are at high risk for pneumococcal disease should obtain the PPSV23 vaccine at least 8 weeks after the dose of PCV13 vaccine. Adults older than 57 years of age who have normal immune system function should obtain the PPSV23 vaccine dose at least 1 year after the dose of PCV13 vaccine.  Pneumococcal polysaccharide (PPSV23) vaccine. When PCV13 is also indicated, PCV13 should be obtained first. All adults aged 65 years and older should be immunized. An adult younger than age 65 years who has certain medical conditions should be immunized. Any person who resides in a nursing home or long-term care facility should be immunized. An adult smoker should be immunized. People with an immunocompromised condition and certain other conditions should receive both PCV13 and PPSV23 vaccines. People with human immunodeficiency virus (HIV) infection should be immunized as soon as possible after diagnosis. Immunization during chemotherapy or radiation therapy should be avoided. Routine use of PPSV23 vaccine is not recommended for American Indians, [place] Natives, or people younger than 65 years unless there are medical conditions that require PPSV23 vaccine. When indicated, people who have unknown immunization and have no record of immunization should receive PPSV23 vaccine. One-time revaccination 5 years after the first dose of PPSV23 is recommended for people aged 19-64 years who have chronic kidney failure, nephrotic syndrome, asplenia, or immunocompromised conditions. People who received 1-2 doses of PPSV23 before age 65 years should receive another dose of PPSV23 vaccine at age 65 years or later if at least 5 years have passed since the previous dose. Doses of PPSV23 are not  needed for people immunized with PPSV23 at or after age 65 years.  Meningococcal vaccine. Adults with asplenia or persistent complement component deficiencies should receive 2 doses of quadrivalent meningococcal conjugate (MenACWY-D) vaccine. The doses should be obtained   at least 2 months apart. Microbiologists working with certain meningococcal bacteria, [place] recruits, people at risk during an outbreak, and people who travel to or live in countries with a high rate of meningitis should be immunized. A first-year college student up through age 34 years who is living in a residence hall should receive a dose if she did not receive a dose on or after her 16th birthday. Adults who have certain high-risk conditions should receive one or more doses of vaccine.  Hepatitis A vaccine. Adults who wish to be protected from this disease, have certain high-risk conditions, work with hepatitis A-infected animals, work in hepatitis A research labs, or travel to or work in countries with a high rate of hepatitis A should be immunized. Adults who were previously unvaccinated and who anticipate close contact with an international adoptee during the first 60 days after arrival in the [place] from a country with a high rate of hepatitis A should be immunized.  Hepatitis B vaccine. Adults who wish to be protected from this disease, have certain high-risk conditions, may be exposed to blood or other infectious body fluids, are household contacts or sex partners of hepatitis B positive people, are clients or workers in certain care facilities, or travel to or work in countries with a high rate of hepatitis B should be immunized.  Haemophilus influenzae type b (Hib) vaccine. A previously unvaccinated person with asplenia or sickle cell disease or having a scheduled splenectomy should receive 1 dose of Hib vaccine. Regardless of previous immunization, a recipient of a hematopoietic stem cell transplant should receive a  3-dose series 6-12 months after her successful transplant. Hib vaccine is not recommended for adults with HIV infection. Preventive Services / Frequency Ages 35 to 4 years  Blood pressure check.** / Every 3-5 years.  Lipid and cholesterol check.** / Every 5 years beginning at age 60.  Clinical breast exam.** / Every 3 years for women in their 71s and 10s.  BRCA-related cancer risk assessment.** / For women who have family members with a BRCA-related cancer (breast, ovarian, tubal, or peritoneal cancers).  Pap test.** / Every 2 years from ages 76 through 26. Every 3 years starting at age 61 through age 76 or 93 with a history of 3 consecutive normal Pap tests.  HPV screening.** / Every 3 years from ages 37 through ages 60 to 51 with a history of 3 consecutive normal Pap tests.  Hepatitis C blood test.** / For any individual with known risks for hepatitis C.  Skin self-exam. / Monthly.  Influenza vaccine. / Every year.  Tetanus, diphtheria, and acellular pertussis (Tdap, Td) vaccine.** / Consult your health care provider. Pregnant women should receive 1 dose of Tdap vaccine during each pregnancy. 1 dose of Td every 10 years.  Varicella vaccine.** / Consult your health care provider. Pregnant females who do not have evidence of immunity should receive the first dose after pregnancy.  HPV vaccine. / 3 doses over 6 months, if 93 and younger. The vaccine is not recommended for use in pregnant females. However, pregnancy testing is not needed before receiving a dose.  Measles, mumps, rubella (MMR) vaccine.** / You need at least 1 dose of MMR if you were born in 1957 or later. You may also need a 2nd dose. For females of childbearing age, rubella immunity should be determined. If there is no evidence of immunity, females who are not pregnant should be vaccinated. If there is no evidence of immunity, females who are  pregnant should delay immunization until after pregnancy.  Pneumococcal  13-valent conjugate (PCV13) vaccine.** / Consult your health care provider.  Pneumococcal polysaccharide (PPSV23) vaccine.** / 1 to 2 doses if you smoke cigarettes or if you have certain conditions.  Meningococcal vaccine.** / 1 dose if you are age 68 to 8 years and a Market researcher living in a residence hall, or have one of several medical conditions, you need to get vaccinated against meningococcal disease. You may also need additional booster doses.  Hepatitis A vaccine.** / Consult your health care provider.  Hepatitis B vaccine.** / Consult your health care provider.  Haemophilus influenzae type b (Hib) vaccine.** / Consult your health care provider. Ages 7 to 53 years  Blood pressure check.** / Every year.  Lipid and cholesterol check.** / Every 5 years beginning at age 25 years.  Lung cancer screening. / Every year if you are aged 11-80 years and have a 30-pack-year history of smoking and currently smoke or have quit within the past 15 years. Yearly screening is stopped once you have quit smoking for at least 15 years or develop a health problem that would prevent you from having lung cancer treatment.  Clinical breast exam.** / Every year after age 48 years.  BRCA-related cancer risk assessment.** / For women who have family members with a BRCA-related cancer (breast, ovarian, tubal, or peritoneal cancers).  Mammogram.** / Every year beginning at age 41 years and continuing for as long as you are in good health. Consult with your health care provider.  Pap test.** / Every 3 years starting at age 65 years through age 37 or 70 years with a history of 3 consecutive normal Pap tests.  HPV screening.** / Every 3 years from ages 72 years through ages 60 to 40 years with a history of 3 consecutive normal Pap tests.  Fecal occult blood test (FOBT) of stool. / Every year beginning at age 21 years and continuing until age 5 years. You may not need to do this test if you get  a colonoscopy every 10 years.  Flexible sigmoidoscopy or colonoscopy.** / Every 5 years for a flexible sigmoidoscopy or every 10 years for a colonoscopy beginning at age 35 years and continuing until age 48 years.  Hepatitis C blood test.** / For all people born from [birth year] through 1965 and any individual with known risks for hepatitis C.  Skin self-exam. / Monthly.  Influenza vaccine. / Every year.  Tetanus, diphtheria, and acellular pertussis (Tdap/Td) vaccine.** / Consult your health care provider. Pregnant women should receive 1 dose of Tdap vaccine during each pregnancy. 1 dose of Td every 10 years.  Varicella vaccine.** / Consult your health care provider. Pregnant females who do not have evidence of immunity should receive the first dose after pregnancy.  Zoster vaccine.** / 1 dose for adults aged 30 years or older.  Measles, mumps, rubella (MMR) vaccine.** / You need at least 1 dose of MMR if you were born in 1957 or later. You may also need a second dose. For females of childbearing age, rubella immunity should be determined. If there is no evidence of immunity, females who are not pregnant should be vaccinated. If there is no evidence of immunity, females who are pregnant should delay immunization until after pregnancy.  Pneumococcal 13-valent conjugate (PCV13) vaccine.** / Consult your health care provider.  Pneumococcal polysaccharide (PPSV23) vaccine.** / 1 to 2 doses if you smoke cigarettes or if you have certain conditions.  Meningococcal vaccine.** /  Consult your health care provider.  Hepatitis A vaccine.** / Consult your health care provider.  Hepatitis B vaccine.** / Consult your health care provider.  Haemophilus influenzae type b (Hib) vaccine.** / Consult your health care provider. Ages 64 years and over  Blood pressure check.** / Every year.  Lipid and cholesterol check.** / Every 5 years beginning at age 23 years.  Lung cancer screening. / Every year if you  are aged 16-80 years and have a 30-pack-year history of smoking and currently smoke or have quit within the past 15 years. Yearly screening is stopped once you have quit smoking for at least 15 years or develop a health problem that would prevent you from having lung cancer treatment.  Clinical breast exam.** / Every year after age 74 years.  BRCA-related cancer risk assessment.** / For women who have family members with a BRCA-related cancer (breast, ovarian, tubal, or peritoneal cancers).  Mammogram.** / Every year beginning at age 44 years and continuing for as long as you are in good health. Consult with your health care provider.  Pap test.** / Every 3 years starting at age 58 years through age 22 or 39 years with 3 consecutive normal Pap tests. Testing can be stopped between 65 and 70 years with 3 consecutive normal Pap tests and no abnormal Pap or HPV tests in the past 10 years.  HPV screening.** / Every 3 years from ages 64 years through ages 70 or 61 years with a history of 3 consecutive normal Pap tests. Testing can be stopped between 65 and 70 years with 3 consecutive normal Pap tests and no abnormal Pap or HPV tests in the past 10 years.  Fecal occult blood test (FOBT) of stool. / Every year beginning at age 40 years and continuing until age 27 years. You may not need to do this test if you get a colonoscopy every 10 years.  Flexible sigmoidoscopy or colonoscopy.** / Every 5 years for a flexible sigmoidoscopy or every 10 years for a colonoscopy beginning at age 7 years and continuing until age 32 years.  Hepatitis C blood test.** / For all people born from [birth year] through 1965 and any individual with known risks for hepatitis C.  Osteoporosis screening.** / A one-time screening for women ages 30 years and over and women at risk for fractures or osteoporosis.  Skin self-exam. / Monthly.  Influenza vaccine. / Every year.  Tetanus, diphtheria, and acellular pertussis (Tdap/Td)  vaccine.** / 1 dose of Td every 10 years.  Varicella vaccine.** / Consult your health care provider.  Zoster vaccine.** / 1 dose for adults aged 35 years or older.  Pneumococcal 13-valent conjugate (PCV13) vaccine.** / Consult your health care provider.  Pneumococcal polysaccharide (PPSV23) vaccine.** / 1 dose for all adults aged 46 years and older.  Meningococcal vaccine.** / Consult your health care provider.  Hepatitis A vaccine.** / Consult your health care provider.  Hepatitis B vaccine.** / Consult your health care provider.  Haemophilus influenzae type b (Hib) vaccine.** / Consult your health care provider. ** Family history and personal history of risk and conditions may change your health care provider's recommendations.   This information is not intended to replace advice given to you by your health care provider. Make sure you discuss any questions you have with your health care provider.   Document Released: 07/21/2001 Document Revised: 06/15/2014 Document Reviewed: 10/20/2010 Elsevier Interactive Patient Education [employer].

## 2015-11-05 NOTE — Progress Notes (Signed)
Subjective:     Carrie [name] is a 55 y.o. female and is here for a comprehensive physical exam. The patient reports no problems.  Social History   Social History  . Marital Status: Single    Spouse Name: N/A  . Number of Children: N/A  . Years of Education: N/A   Occupational History  . Not on file.   Social History Main Topics  . Smoking status: Former Smoker    Types: Cigarettes    Quit date: 02/05/2014  . Smokeless tobacco: Never Used     Comment: 1 cig every 4-5 days ---weaning off  . Alcohol Use: 0.0 oz/week    0 Standard drinks or equivalent per week  . Drug Use: No  . Sexual Activity:    Partners: Male   Other Topics Concern  . Not on file   Social History Narrative   Exercise --rare   Health Maintenance  Topic Date Due  . Hepatitis C Screening  Nov 25, 1960  . HIV Screening  11/29/2015 (Originally 01/11/1974)  . MAMMOGRAM  11/08/2015  . INFLUENZA VACCINE  01/07/2016  . PAP SMEAR  09/07/2018  . COLONOSCOPY  07/18/2019  . TETANUS/TDAP  01/25/2023    The following portions of the patient's history were reviewed and updated as appropriate:  She  has a past medical history of Hyperlipidemia and Hypertension. She  does not have any pertinent problems on file. She  has past surgical history that includes Abdominal hysterectomy. Her family history includes Diabetes in her sister. She  reports that she quit smoking about 20 months ago. Her smoking use included Cigarettes. She has never used smokeless tobacco. She reports that she drinks alcohol. She reports that she does not use illicit drugs. She has a current medication list which includes the following prescription(s): amlodipine, atorvastatin, bupropion, and lisinopril. Current Outpatient Prescriptions on File Prior to Visit  Medication Sig Dispense Refill  . amLODipine (NORVASC) 5 MG tablet Take 1 tablet (5 mg total) by mouth daily. 90 tablet 1  . atorvastatin (LIPITOR) 20 MG tablet TAKE ONE TABLET AT  BEDTIME. 90 tablet 1  . buPROPion (WELLBUTRIN XL) 150 MG 24 hr tablet TAKE (2) TABLETS DAILY. 60 tablet 0  . lisinopril (PRINIVIL,ZESTRIL) 20 MG tablet TAKE 1 TABLET ONCE DAILY. 90 tablet 0   No current facility-administered medications on file prior to visit.   She has No Known Allergies..  Review of Systems Review of Systems  Constitutional: Negative for activity change, appetite change and fatigue.  HENT: Negative for hearing loss, congestion, tinnitus and ear discharge.  dentist q48m Eyes: Negative for visual disturbance (see optho q1y -- vision corrected to 20/20 with glasses).  Respiratory: Negative for cough, chest tightness and shortness of breath.   Cardiovascular: Negative for chest pain, palpitations and leg swelling.  Gastrointestinal: Negative for abdominal pain, diarrhea, constipation and abdominal distention.  Genitourinary: Negative for urgency, frequency, decreased urine volume and difficulty urinating.  Musculoskeletal: Negative for back pain, arthralgias and gait problem.  Skin: Negative for color change, pallor and rash.  Neurological: Negative for dizziness, light-headedness, numbness and headaches.  Hematological: Negative for adenopathy. Does not bruise/bleed easily.  Psychiatric/Behavioral: Negative for suicidal ideas, confusion, sleep disturbance, self-injury, dysphoric mood, decreased concentration and agitation.       Objective:    BP 112/60 mmHg  Pulse 99  Temp(Src) 97.9 F (36.6 C) (Oral)  Ht 5\' 4"  (1.626 m)  Wt 184 lb (83.462 kg)  BMI 31.57 kg/m2  SpO2 98% General appearance:  alert, cooperative, appears stated age and no distress Head: Normocephalic, without obvious abnormality, atraumatic Eyes: conjunctivae/corneas clear. PERRL, EOM's intact. Fundi benign. Ears: normal TM's and external ear canals both ears Nose: Nares normal. Septum midline. Mucosa normal. No drainage or sinus tenderness. Throat: lips, mucosa, and tongue normal; teeth and gums  normal Neck: no adenopathy, no carotid bruit, no JVD, supple, symmetrical, trachea midline and thyroid not enlarged, symmetric, no tenderness/mass/nodules Back: symmetric, no curvature. ROM normal. No CVA tenderness. Lungs: clear to auscultation bilaterally Breasts: gyn Heart: regular rate and rhythm, S1, S2 normal, no murmur, click, rub or gallop Abdomen: soft, non-tender; bowel sounds normal; no masses,  no organomegaly Pelvic: deferred--gyn Extremities: extremities normal, atraumatic, no cyanosis or edema Pulses: 2+ and symmetric Skin: Skin color, texture, turgor normal. No rashes or lesions Lymph nodes: Cervical, supraclavicular, and axillary nodes normal. Neurologic: Alert and oriented X 3, normal strength and tone. Normal symmetric reflexes. Normal coordination and gait    Assessment:    Healthy female exam.     Plan:  Check labs    ghm utd See After Visit Summary for Counseling Recommendations    1. Essential hypertension   - Lipid panel; Future - POCT urinalysis dipstick; Future - TSH; Future - CBC with Differential/Platelet; Future - Comprehensive metabolic panel; Future - Hepatitis C antibody; Future  2. Hyperlipidemia   - Lipid panel; Future - POCT urinalysis dipstick; Future - TSH; Future - CBC with Differential/Platelet; Future - Comprehensive metabolic panel; Future  3. Preventative health care  See above - Lipid panel; Future - POCT urinalysis dipstick; Future - TSH; Future - CBC with Differential/Platelet; Future - Comprehensive metabolic panel; Future  4. Need for hepatitis C screening test   - Hepatitis C antibody; Future

## 2015-11-05 NOTE — Progress Notes (Signed)
Pre visit review using our clinic review tool, if applicable. No additional management support is needed unless otherwise documented below in the visit note. 

## 2015-11-08 ENCOUNTER — Other Ambulatory Visit: Payer: Federal, State, Local not specified - PPO

## 2015-11-14 ENCOUNTER — Other Ambulatory Visit (INDEPENDENT_AMBULATORY_CARE_PROVIDER_SITE_OTHER): Payer: Federal, State, Local not specified - PPO

## 2015-11-14 ENCOUNTER — Other Ambulatory Visit: Payer: Federal, State, Local not specified - PPO

## 2015-11-14 DIAGNOSIS — E785 Hyperlipidemia, unspecified: Secondary | ICD-10-CM

## 2015-11-14 DIAGNOSIS — Z0000 Encounter for general adult medical examination without abnormal findings: Secondary | ICD-10-CM

## 2015-11-14 DIAGNOSIS — Z1159 Encounter for screening for other viral diseases: Secondary | ICD-10-CM | POA: Diagnosis not present

## 2015-11-14 DIAGNOSIS — I10 Essential (primary) hypertension: Secondary | ICD-10-CM | POA: Diagnosis not present

## 2015-11-14 LAB — COMPREHENSIVE METABOLIC PANEL
ALBUMIN: 4.6 g/dL (ref 3.5–5.2)
ALT: 20 U/L (ref 0–35)
AST: 19 U/L (ref 0–37)
Alkaline Phosphatase: 70 U/L (ref 39–117)
BUN: 14 mg/dL (ref 6–23)
CALCIUM: 9.8 mg/dL (ref 8.4–10.5)
CO2: 26 meq/L (ref 19–32)
Chloride: 106 mEq/L (ref 96–112)
Creatinine, Ser: 1.05 mg/dL (ref 0.40–1.20)
GFR: 69.51 mL/min (ref >60.00)
GLUCOSE: 109 mg/dL — AB (ref 70–99)
Potassium: 4.1 mEq/L (ref 3.5–5.1)
Sodium: 138 mEq/L (ref 135–145)
TOTAL PROTEIN: 7.6 g/dL (ref 6.0–8.3)
Total Bilirubin: 0.6 mg/dL (ref 0.2–1.2)

## 2015-11-14 LAB — POCT URINALYSIS DIPSTICK
BILIRUBIN UA: NEGATIVE
Blood, UA: NEGATIVE
Glucose, UA: NEGATIVE
KETONES UA: NEGATIVE
Leukocytes, UA: NEGATIVE
Nitrite, UA: NEGATIVE
PH UA: 6.5
SPEC GRAV UA: 1.02
Urobilinogen, UA: 1

## 2015-11-14 LAB — CBC WITH DIFFERENTIAL/PLATELET
BASOS ABS: 0.1 10*3/uL (ref 0.0–0.1)
BASOS PCT: 1.1 % (ref 0.0–3.0)
EOS ABS: 0.1 10*3/uL (ref 0.0–0.7)
Eosinophils Relative: 1.7 % (ref 0.0–5.0)
HEMATOCRIT: 39.5 % (ref 36.0–46.0)
HEMOGLOBIN: 13.2 g/dL (ref 12.0–15.0)
Lymphocytes Relative: 26.8 % (ref 12.0–46.0)
Lymphs Abs: 1.9 10*3/uL (ref 0.7–4.0)
MCHC: 33.3 g/dL (ref 30.0–36.0)
MCV: 90.5 fl (ref 78.0–100.0)
MONOS PCT: 7.2 % (ref 3.0–12.0)
Monocytes Absolute: 0.5 10*3/uL (ref 0.1–1.0)
Neutro Abs: 4.4 10*3/uL (ref 1.4–7.7)
Neutrophils Relative %: 63.2 % (ref 43.0–77.0)
Platelets: 371 10*3/uL (ref 150.0–400.0)
RBC: 4.37 Mil/uL (ref 3.87–5.11)
RDW: 14 % (ref 11.5–15.5)
WBC: 7 10*3/uL (ref 4.0–10.5)

## 2015-11-14 LAB — LIPID PANEL
Cholesterol: 198 mg/dL (ref 0–200)
HDL: 61 mg/dL (ref >39.00)
LDL CALC: 124 mg/dL — AB (ref 0–99)
NONHDL: 137.14
Total CHOL/HDL Ratio: 3
Triglycerides: 64 mg/dL (ref 0.0–149.0)
VLDL: 12.8 mg/dL (ref 0.0–40.0)

## 2015-11-14 LAB — TSH: TSH: 1.52 u[IU]/mL (ref 0.35–4.50)

## 2015-11-15 LAB — HEPATITIS C ANTIBODY: HCV Ab: NEGATIVE

## 2015-11-21 ENCOUNTER — Other Ambulatory Visit: Payer: Federal, State, Local not specified - PPO

## 2015-11-27 ENCOUNTER — Other Ambulatory Visit: Payer: Self-pay | Admitting: Family Medicine

## 2015-11-27 NOTE — Telephone Encounter (Signed)
Rx's sent to the pharmacy by e-script.//AB/CMA 

## 2015-11-27 NOTE — Telephone Encounter (Deleted)
Patient requesting

## 2016-03-30 ENCOUNTER — Other Ambulatory Visit: Payer: Self-pay | Admitting: Family Medicine

## 2016-03-30 DIAGNOSIS — E785 Hyperlipidemia, unspecified: Secondary | ICD-10-CM

## 2016-03-31 NOTE — Telephone Encounter (Signed)
Sent refill of amlodipine and lisinopril to pharmacy. Pt is due for f/u with Dr Zola Button on 05/07/16.  Please call pt to  Schedule appointment. Thanks!

## 2016-04-01 NOTE — Telephone Encounter (Signed)
Patient is currently on the road will call back to schedule follow up.

## 2016-06-10 DIAGNOSIS — Z6832 Body mass index (BMI) 32.0-32.9, adult: Secondary | ICD-10-CM | POA: Diagnosis not present

## 2016-06-10 DIAGNOSIS — Z1231 Encounter for screening mammogram for malignant neoplasm of breast: Secondary | ICD-10-CM | POA: Diagnosis not present

## 2016-06-10 DIAGNOSIS — Z01419 Encounter for gynecological examination (general) (routine) without abnormal findings: Secondary | ICD-10-CM | POA: Diagnosis not present

## 2016-06-11 DIAGNOSIS — K080 Exfoliation of teeth due to systemic causes: Secondary | ICD-10-CM | POA: Diagnosis not present

## 2016-08-03 ENCOUNTER — Telehealth: Payer: Self-pay | Admitting: Family Medicine

## 2016-08-03 MED ORDER — ATORVASTATIN CALCIUM 20 MG PO TABS: 20.0000 mg | ORAL_TABLET | Freq: Every day | ORAL | 0 refills | Status: DC

## 2016-08-03 NOTE — Telephone Encounter (Signed)
Sent in cholesterol medication to [facility] and patient is aware. Made her an appointment with PCP for allergy symptoms 08/04/16 at 4 .   Patient had received a depo-medrol last February for symptoms and it really helped.

## 2016-08-03 NOTE — Telephone Encounter (Signed)
Caller name: Relationship to patient: Self Can be reached: [phone]  Pharmacy:  Reason for call: Patient is requesting an Allergy Shot. Plse adv Patient also scheduled to come in for medication Follow up

## 2016-08-03 NOTE — Telephone Encounter (Signed)
Patient also request a refill on atorvastatin (LIPITOR) 20 MG tablet. States she is out. [facility] - [place], [place] - [address] [phone] (Phone) [phone] (Fax)

## 2016-08-04 ENCOUNTER — Ambulatory Visit (INDEPENDENT_AMBULATORY_CARE_PROVIDER_SITE_OTHER): Payer: Federal, State, Local not specified - PPO | Admitting: Family Medicine

## 2016-08-04 ENCOUNTER — Encounter: Payer: Self-pay | Admitting: Family Medicine

## 2016-08-04 ENCOUNTER — Ambulatory Visit: Payer: Federal, State, Local not specified - PPO | Admitting: Family Medicine

## 2016-08-04 VITALS — BP 116/83 | HR 97 | Temp 97.7°F | Wt 189.2 lb

## 2016-08-04 DIAGNOSIS — L299 Pruritus, unspecified: Secondary | ICD-10-CM | POA: Diagnosis not present

## 2016-08-04 DIAGNOSIS — J3089 Other allergic rhinitis: Secondary | ICD-10-CM | POA: Diagnosis not present

## 2016-08-04 MED ORDER — MONTELUKAST SODIUM 10 MG PO TABS: 10.0000 mg | ORAL_TABLET | Freq: Every day | ORAL | 3 refills | Status: DC

## 2016-08-04 MED ORDER — LEVOCETIRIZINE DIHYDROCHLORIDE 5 MG PO TABS: 5.0000 mg | ORAL_TABLET | Freq: Every evening | ORAL | 5 refills | Status: DC

## 2016-08-04 NOTE — Progress Notes (Signed)
Pre visit review using our clinic review tool, if applicable. No additional management support is needed unless otherwise documented below in the visit note. 

## 2016-08-04 NOTE — Patient Instructions (Signed)
Allergies, Adult An allergy is when your body's defense system (immune system) overreacts to an otherwise harmless substance (allergen) that you breathe in or eat or something that touches your skin. When you come into contact with something that you are allergic to, your immune system produces certain proteins (antibodies). These proteins cause cells to release chemicals (histamines) that trigger the symptoms of an allergic reaction. Allergies often affect the nasal passages (allergic rhinitis), eyes (allergic conjunctivitis), skin (atopic dermatitis), and stomach. Allergies can be mild or severe. Allergies cannot spread from person to person (are not contagious). They can develop at any age and may be outgrown. What are the causes? Allergies can be caused by any substance that your immune system mistakenly targets as harmful. These may include:  Outdoor allergens, such as pollen, grass, weeds, car exhaust, and mold spores.  Indoor allergens, such as dust, smoke, mold, and pet dander.  Foods, especially peanuts, milk, eggs, fish, shellfish, soy, nuts, and wheat.  Medicines, such as penicillin.  Skin irritants, such as detergents, chemicals, and latex.  Perfume.  Insect bites or stings. What increases the risk? You may be at greater risk of allergies if other people in your family have allergies. What are the signs or symptoms? Symptoms depend on what type of allergy you have. They may include:  Runny, stuffy nose.  Sneezing.  Itchy mouth, ears, or throat.  Postnasal drip.  Sore throat.  Itchy, red, watery, or puffy eyes.  Skin rash or hives.  Stomach pain.  Vomiting.  Diarrhea.  Bloating.  Wheezing or coughing. People with a severe allergy to food, medicine, or an insect bite may have a life-threatening allergic reaction (anaphylaxis). Symptoms of anaphylaxis include:  Hives.  Itching.  Flushed face.  Swollen lips, tongue, or mouth.  Tight or swollen  throat.  Chest pain or tightness in the chest.  Trouble breathing or shortness of breath.  Rapid heartbeat.  Dizziness or fainting.  Vomiting.  Diarrhea.  Pain in the abdomen. How is this diagnosed? This condition is diagnosed based on:  Your symptoms.  Your family and medical history.  A physical exam. You may need to see a health care provider who specializes in treating allergies (allergist). You may also have tests, including:  Skin tests to see which allergens are causing your symptoms, such as:  Skin prick test. In this test, your skin is pricked with a tiny needle and exposed to small amounts of possible allergens to see if your skin reacts.  Intradermal skin test. In this test, a small amount of allergen is injected under your skin to see if your skin reacts.  Patch test. In this test, a small amount of allergen is placed on your skin and then your skin is covered with a bandage. Your health care provider will check your skin after a couple of days to see if a rash has developed.  Blood tests.  Challenges tests. In this test, you inhale a small amount of allergen by mouth to see if you have an allergic reaction. You may also be asked to:  Keep a food diary. A food diary is a record of all the foods and drinks you have in a day and any symptoms you experience.  Practice an elimination diet. An elimination diet involves eliminating specific foods from your diet and then adding them back in one by one to find out if a certain food causes an allergic reaction. How is this treated? Treatment for allergies depends on your symptoms.   Treatment may include:  Cold compresses to soothe itching and swelling.  Eye drops.  Nasal sprays.  Using a saline spray or container (neti pot) to flush out the nose (nasal irrigation). These methods can help clear away mucus and keep the nasal passages moist.  Using a humidifier.  Oral antihistamines or other medicines to block  allergic reaction and inflammation.  Skin creams to treat rashes or itching.  Diet changes to eliminate food allergy triggers.  Repeated exposure to tiny amounts of allergens to build up a tolerance and prevent future allergic reactions (immunotherapy). These include:  Allergy shots.  Oral treatment. This involves taking small doses of an allergen under the tongue (sublingual immunotherapy).  Emergency epinephrine injection (auto-injector) in case of an allergic emergency. This is a self-injectable, pre-measured medicine that must be given within the first few minutes of a serious allergic reaction. Follow these instructions at home:  Avoid known allergens whenever possible.  If you suffer from airborne allergens, wash out your nose daily. You can do this with a saline spray or a neti pot to flush out your nose (nasal irrigation).  Take over-the-counter and prescription medicines only as told by your health care provider.  Keep all follow-up visits as told by your health care provider. This is important.  If you are at risk of a severe allergic reaction (anaphylaxis), keep your auto-injector with you at all times.  If you have ever had anaphylaxis, wear a medical alert bracelet or necklace that states you have a severe allergy. Contact a health care provider if:  Your symptoms do not improve with treatment. Get help right away if:  You have symptoms of anaphylaxis, such as:  Swollen mouth, tongue, or throat.  Pain or tightness in your chest.  Trouble breathing or shortness of breath.  Dizziness or fainting.  Severe abdominal pain, vomiting, or diarrhea. This information is not intended to replace advice given to you by your health care provider. Make sure you discuss any questions you have with your health care provider. Document Released: 08/18/2002 Document Revised: 01/23/2016 Document Reviewed: 12/11/2015 Elsevier Interactive Patient Education  2017 Elsevier Inc[employer]  

## 2016-08-04 NOTE — Assessment & Plan Note (Signed)
xyzal and singulair To allergist if no better

## 2016-08-04 NOTE — Assessment & Plan Note (Signed)
xyzal and singulair If this does not work I advised pt to go back to allergist  Allergy shots were next per pt

## 2016-08-04 NOTE — Progress Notes (Signed)
Patient ID: Carrie [name], female   DOB: 1960/07/21, 56 y.o.   MRN: [id number]   Subjective:    Patient ID: Carrie [name], female    DOB: 08-Sep-1960, 56 y.o.   MRN: [id number]  Chief Complaint  Patient presents with  . Allergy Symptoms    Severe itching  for the past 1.5 weeks. States that she may suffer from itching once or twice per year.    HPI  Patient is in today for an acute visit due to severe allergy symptoms. Patient states for the past 1.5 weeks she has experienced severe itching. States that no anti itch medications appears to be helping.  No additional acute concerns noted at this time.  I acted as a Neurosurgeon for [facility], DO. Diamond Nickel, RMA   Past Medical History:  Diagnosis Date  . Hyperlipidemia   . Hypertension     Past Surgical History:  Procedure Laterality Date  . ABDOMINAL HYSTERECTOMY      Family History  Problem Relation Age of Onset  . Breast cancer    . Coronary artery disease    . Diabetes Sister   . Hypertension      Social History   Social History  . Marital status: Single    Spouse name: N/A  . Number of children: N/A  . Years of education: N/A   Occupational History  . Not on file.   Social History Main Topics  . Smoking status: Former Smoker    Types: Cigarettes    Quit date: 02/05/2014  . Smokeless tobacco: Never Used     Comment: 1 cig every 4-5 days ---weaning off  . Alcohol use 0.0 oz/week  . Drug use: No  . Sexual activity: Not Currently    Partners: Male   Other Topics Concern  . Not on file   Social History Narrative   Exercise --rare    Outpatient Medications Prior to Visit  Medication Sig Dispense Refill  . amLODipine (NORVASC) 5 MG tablet TAKE 1 TABLET ONCE DAILY. 90 tablet 0  . atorvastatin (LIPITOR) 20 MG tablet Take 1 tablet (20 mg total) by mouth at bedtime. 90 tablet 0  . buPROPion (WELLBUTRIN XL) 150 MG 24 hr tablet TAKE (2) TABLETS DAILY. 60 tablet 5  . lisinopril (PRINIVIL,ZESTRIL) 20 MG tablet  TAKE 1 TABLET ONCE DAILY. 90 tablet 0   No facility-administered medications prior to visit.     No Known Allergies  Review of Systems  Constitutional: Negative for fever and malaise/fatigue.  HENT: Negative for congestion.   Eyes: Negative for blurred vision.  Respiratory: Negative for cough and shortness of breath.   Cardiovascular: Negative for chest pain, palpitations and leg swelling.  Gastrointestinal: Negative for vomiting.  Musculoskeletal: Negative for back pain.  Skin: Positive for itching. Negative for rash.       Severe itching for the past 1.5 weeks due to allergies.  Neurological: Negative for loss of consciousness and headaches.       Objective:    Physical Exam  Constitutional: She is oriented to person, place, and time. She appears well-developed and well-nourished. No distress.  HENT:  Head: Normocephalic and atraumatic.  Eyes: Conjunctivae are normal.  Neck: Normal range of motion. No thyromegaly present.  Cardiovascular: Normal rate and regular rhythm.   Pulmonary/Chest: Effort normal and breath sounds normal. She has no wheezes.  Abdominal: Soft. Bowel sounds are normal. There is no tenderness.  Musculoskeletal: She exhibits no edema or deformity.  Neurological: She is  alert and oriented to person, place, and time.  Skin: Skin is warm and dry. She is not diaphoretic.  Psychiatric: She has a normal mood and affect.    BP 116/83 (BP Location: Left Arm, Patient Position: Sitting, Cuff Size: Large)   Pulse 97   Temp 97.7 F (36.5 C) (Oral)   Wt 189 lb 3.2 oz (85.8 kg)   SpO2 96% Comment: RA  BMI 32.48 kg/m  Wt Readings from Last 3 Encounters:  08/04/16 189 lb 3.2 oz (85.8 kg)  11/05/15 184 lb (83.5 kg)  07/11/15 184 lb (83.5 kg)     Lab Results  Component Value Date   WBC 7.0 11/14/2015   HGB 13.2 11/14/2015   HCT 39.5 11/14/2015   PLT 371.0 11/14/2015   GLUCOSE 109 (H) 11/14/2015   CHOL 198 11/14/2015   TRIG 64.0 11/14/2015   HDL 61.00  11/14/2015   LDLDIRECT 213.8 08/29/2012   LDLCALC 124 (H) 11/14/2015   ALT 20 11/14/2015   AST 19 11/14/2015   NA 138 11/14/2015   K 4.1 11/14/2015   CL 106 11/14/2015   CREATININE 1.05 11/14/2015   BUN 14 11/14/2015   CO2 26 11/14/2015   TSH 1.52 11/14/2015   HGBA1C 6.0 11/29/2014    Lab Results  Component Value Date   TSH 1.52 11/14/2015   Lab Results  Component Value Date   WBC 7.0 11/14/2015   HGB 13.2 11/14/2015   HCT 39.5 11/14/2015   MCV 90.5 11/14/2015   PLT 371.0 11/14/2015   Lab Results  Component Value Date   NA 138 11/14/2015   K 4.1 11/14/2015   CO2 26 11/14/2015   GLUCOSE 109 (H) 11/14/2015   BUN 14 11/14/2015   CREATININE 1.05 11/14/2015   BILITOT 0.6 11/14/2015   ALKPHOS 70 11/14/2015   AST 19 11/14/2015   ALT 20 11/14/2015   PROT 7.6 11/14/2015   ALBUMIN 4.6 11/14/2015   CALCIUM 9.8 11/14/2015   GFR 69.51 11/14/2015   Lab Results  Component Value Date   CHOL 198 11/14/2015   Lab Results  Component Value Date   HDL 61.00 11/14/2015   Lab Results  Component Value Date   LDLCALC 124 (H) 11/14/2015   Lab Results  Component Value Date   TRIG 64.0 11/14/2015   Lab Results  Component Value Date   CHOLHDL 3 11/14/2015   Lab Results  Component Value Date   HGBA1C 6.0 11/29/2014       Assessment & Plan:   Problem List Items Addressed This Visit      Unprioritized   Environmental and seasonal allergies    xyzal and singulair If this does not work I advised pt to go back to allergist  Allergy shots were next per pt      Relevant Medications   montelukast (SINGULAIR) 10 MG tablet   Pruritus - Primary    xyzal and singulair To allergist if no better       Relevant Medications   levocetirizine (XYZAL) 5 MG tablet   montelukast (SINGULAIR) 10 MG tablet      I am having Ms. Carbone start on levocetirizine and montelukast. I am also having her maintain her buPROPion, amLODipine, lisinopril, and atorvastatin.  Meds ordered  this encounter  Medications  . levocetirizine (XYZAL) 5 MG tablet    Sig: Take 1 tablet (5 mg total) by mouth every evening.    Dispense:  30 tablet    Refill:  5  . montelukast (SINGULAIR) 10  MG tablet    Sig: Take 1 tablet (10 mg total) by mouth at bedtime.    Dispense:  30 tablet    Refill:  3    CMA served as scribe during this visit. History, Physical and Plan performed by medical provider. Documentation and orders reviewed and attested to.  Donato Schultz, DO

## 2016-08-13 ENCOUNTER — Telehealth: Payer: Self-pay | Admitting: Family Medicine

## 2016-08-13 MED ORDER — ACETIC ACID 2 % OT SOLN: 4.0000 [drp] | Freq: Three times a day (TID) | OTIC | 0 refills | Status: DC

## 2016-08-13 NOTE — Telephone Encounter (Signed)
Patient informed and sent in to [facility].

## 2016-08-13 NOTE — Telephone Encounter (Signed)
Pt called in she says that her ears are feeling better. Pt says that she is still experiencing the itching. Pt would like to know if provider would call her in something for it to pharmacy   Pharmacy: [facility]   CB: [phone]

## 2016-08-13 NOTE — Telephone Encounter (Signed)
Acetic acid otic 3-5 gtts in both ears q8h x 7 days

## 2016-09-01 ENCOUNTER — Ambulatory Visit: Payer: Federal, State, Local not specified - PPO | Admitting: Family Medicine

## 2016-10-12 ENCOUNTER — Ambulatory Visit: Payer: Federal, State, Local not specified - PPO | Admitting: Family Medicine

## 2016-10-17 ENCOUNTER — Other Ambulatory Visit: Payer: Self-pay | Admitting: Family Medicine

## 2016-10-20 ENCOUNTER — Encounter: Payer: Self-pay | Admitting: Family Medicine

## 2016-10-20 ENCOUNTER — Ambulatory Visit (INDEPENDENT_AMBULATORY_CARE_PROVIDER_SITE_OTHER): Payer: Federal, State, Local not specified - PPO | Admitting: Family Medicine

## 2016-10-20 VITALS — BP 120/88 | HR 98 | Temp 98.1°F | Resp 16 | Ht 64.0 in | Wt 187.6 lb

## 2016-10-20 DIAGNOSIS — F320 Major depressive disorder, single episode, mild: Secondary | ICD-10-CM | POA: Diagnosis not present

## 2016-10-20 DIAGNOSIS — I10 Essential (primary) hypertension: Secondary | ICD-10-CM | POA: Diagnosis not present

## 2016-10-20 DIAGNOSIS — E785 Hyperlipidemia, unspecified: Secondary | ICD-10-CM | POA: Diagnosis not present

## 2016-10-20 LAB — COMPREHENSIVE METABOLIC PANEL
ALBUMIN: 4.5 g/dL (ref 3.5–5.2)
ALT: 18 U/L (ref 0–35)
AST: 18 U/L (ref 0–37)
Alkaline Phosphatase: 77 U/L (ref 39–117)
BILIRUBIN TOTAL: 0.7 mg/dL (ref 0.2–1.2)
BUN: 17 mg/dL (ref 6–23)
CO2: 25 mEq/L (ref 19–32)
CREATININE: 1.2 mg/dL (ref 0.40–1.20)
Calcium: 10.2 mg/dL (ref 8.4–10.5)
Chloride: 107 mEq/L (ref 96–112)
GFR: 59.39 mL/min — ABNORMAL LOW (ref >60.00)
Glucose, Bld: 119 mg/dL — ABNORMAL HIGH (ref 70–99)
Potassium: 3.9 mEq/L (ref 3.5–5.1)
SODIUM: 138 meq/L (ref 135–145)
TOTAL PROTEIN: 7.7 g/dL (ref 6.0–8.3)

## 2016-10-20 LAB — LIPID PANEL
CHOLESTEROL: 198 mg/dL (ref 0–200)
HDL: 57 mg/dL (ref >39.00)
LDL Cholesterol: 115 mg/dL — ABNORMAL HIGH (ref 0–99)
NONHDL: 140.58
Total CHOL/HDL Ratio: 3
Triglycerides: 130 mg/dL (ref 0.0–149.0)
VLDL: 26 mg/dL (ref 0.0–40.0)

## 2016-10-20 MED ORDER — LISINOPRIL 20 MG PO TABS: 20.0000 mg | ORAL_TABLET | Freq: Every day | ORAL | 1 refills | Status: DC

## 2016-10-20 MED ORDER — BUPROPION HCL ER (XL) 300 MG PO TB24: 300.0000 mg | ORAL_TABLET | Freq: Every day | ORAL | 1 refills | Status: DC

## 2016-10-20 MED ORDER — AMLODIPINE BESYLATE 5 MG PO TABS: 5.0000 mg | ORAL_TABLET | Freq: Every day | ORAL | 1 refills | Status: DC

## 2016-10-20 NOTE — Progress Notes (Signed)
Patient ID: Carrie [name], female   DOB: 01-Nov-1960, 56 y.o.   MRN: [id number]     Subjective:  I acted as a Neurosurgeon for Dr. Zola Button.  Apolonio Schneiders, CMA   Patient ID: Carrie [name], female    DOB: 01-Nov-1960, 56 y.o.   MRN: [id number]  Chief Complaint  Patient presents with  . Hypertension  . Hyperlipidemia  . Depression    HPI  Patient is in today for follow up cholesterol, blood pressure, and depression.  She has been doing well on all medications and has no complaints.  She states that the Wellbutrin also helps with her hot flashes.   Patient Care Team: Zola Button, Grayling Congress, DO as PCP - General Maxie Better, MD as Consulting Physician (Obstetrics and Gynecology)   Past Medical History:  Diagnosis Date  . Hyperlipidemia   . Hypertension     Past Surgical History:  Procedure Laterality Date  . ABDOMINAL HYSTERECTOMY      Family History  Problem Relation Age of Onset  . Breast cancer Unknown   . Coronary artery disease Unknown   . Diabetes Sister   . Hypertension Unknown     Social History   Social History  . Marital status: Single    Spouse name: N/A  . Number of children: N/A  . Years of education: N/A   Occupational History  . Not on file.   Social History Main Topics  . Smoking status: Former Smoker    Types: Cigarettes    Quit date: 02/05/2014  . Smokeless tobacco: Never Used     Comment: 1 cig every 4-5 days ---weaning off  . Alcohol use 0.0 oz/week  . Drug use: No  . Sexual activity: Not Currently    Partners: Male   Other Topics Concern  . Not on file   Social History Narrative   Exercise --rare    Outpatient Medications Prior to Visit  Medication Sig Dispense Refill  . atorvastatin (LIPITOR) 20 MG tablet Take 1 tablet (20 mg total) by mouth at bedtime. 90 tablet 0  . levocetirizine (XYZAL) 5 MG tablet Take 1 tablet (5 mg total) by mouth every evening. 30 tablet 5  . montelukast (SINGULAIR) 10 MG tablet Take 1 tablet (10 mg total)  by mouth at bedtime. 30 tablet 3  . amLODipine (NORVASC) 5 MG tablet TAKE 1 TABLET ONCE DAILY. 90 tablet 0  . buPROPion (WELLBUTRIN XL) 150 MG 24 hr tablet TAKE (2) TABLETS DAILY. 60 tablet 0  . lisinopril (PRINIVIL,ZESTRIL) 20 MG tablet TAKE 1 TABLET ONCE DAILY. 90 tablet 0  . acetic acid (VOSOL) 2 % otic solution Place 4 drops into both ears 3 (three) times daily. 15 mL 0   No facility-administered medications prior to visit.     No Known Allergies  Review of Systems  Constitutional: Negative for fever and malaise/fatigue.  HENT: Negative for congestion.   Eyes: Negative for blurred vision.  Respiratory: Negative for cough and shortness of breath.   Cardiovascular: Negative for chest pain, palpitations and leg swelling.  Gastrointestinal: Negative for vomiting.  Musculoskeletal: Negative for back pain.  Skin: Negative for rash.  Neurological: Negative for loss of consciousness and headaches.       Objective:    Physical Exam  Constitutional: She is oriented to person, place, and time. She appears well-developed and well-nourished. No distress.  HENT:  Head: Normocephalic and atraumatic.  Eyes: Conjunctivae are normal.  Neck: Normal range of motion. No thyromegaly present.  Cardiovascular:  Normal rate and regular rhythm.   Pulmonary/Chest: Effort normal and breath sounds normal. She has no wheezes.  Abdominal: Soft. Bowel sounds are normal. There is no tenderness.  Musculoskeletal: Normal range of motion. She exhibits no edema or deformity.  Neurological: She is alert and oriented to person, place, and time.  Skin: Skin is warm and dry. She is not diaphoretic.  Psychiatric: She has a normal mood and affect.    BP 120/88 (BP Location: Right Arm, Cuff Size: Large)   Pulse 98   Temp 98.1 F (36.7 C) (Oral)   Resp 16   Ht 5\' 4"  (1.626 m)   Wt 187 lb 9.6 oz (85.1 kg)   SpO2 98%   BMI 32.20 kg/m  Wt Readings from Last 3 Encounters:  10/20/16 187 lb 9.6 oz (85.1 kg)    08/04/16 189 lb 3.2 oz (85.8 kg)  11/05/15 184 lb (83.5 kg)   BP Readings from Last 3 Encounters:  10/20/16 120/88  08/04/16 116/83  11/05/15 112/60     Immunization History  Administered Date(s) Administered  . PPD Test 01/24/2013  . Td 02/22/2002  . Tdap 01/24/2013    Health Maintenance  Topic Date Due  . HIV Screening  01/11/1974  . MAMMOGRAM  11/08/2015  . INFLUENZA VACCINE  01/06/2017  . PAP SMEAR  09/07/2018  . COLONOSCOPY  07/18/2019  . TETANUS/TDAP  01/25/2023  . Hepatitis C Screening  Completed    Lab Results  Component Value Date   WBC 7.0 11/14/2015   HGB 13.2 11/14/2015   HCT 39.5 11/14/2015   PLT 371.0 11/14/2015   GLUCOSE 109 (H) 11/14/2015   CHOL 198 11/14/2015   TRIG 64.0 11/14/2015   HDL 61.00 11/14/2015   LDLDIRECT 213.8 08/29/2012   LDLCALC 124 (H) 11/14/2015   ALT 20 11/14/2015   AST 19 11/14/2015   NA 138 11/14/2015   K 4.1 11/14/2015   CL 106 11/14/2015   CREATININE 1.05 11/14/2015   BUN 14 11/14/2015   CO2 26 11/14/2015   TSH 1.52 11/14/2015   HGBA1C 6.0 11/29/2014    Lab Results  Component Value Date   TSH 1.52 11/14/2015   Lab Results  Component Value Date   WBC 7.0 11/14/2015   HGB 13.2 11/14/2015   HCT 39.5 11/14/2015   MCV 90.5 11/14/2015   PLT 371.0 11/14/2015   Lab Results  Component Value Date   NA 138 11/14/2015   K 4.1 11/14/2015   CO2 26 11/14/2015   GLUCOSE 109 (H) 11/14/2015   BUN 14 11/14/2015   CREATININE 1.05 11/14/2015   BILITOT 0.6 11/14/2015   ALKPHOS 70 11/14/2015   AST 19 11/14/2015   ALT 20 11/14/2015   PROT 7.6 11/14/2015   ALBUMIN 4.6 11/14/2015   CALCIUM 9.8 11/14/2015   GFR 69.51 11/14/2015   Lab Results  Component Value Date   CHOL 198 11/14/2015   Lab Results  Component Value Date   HDL 61.00 11/14/2015   Lab Results  Component Value Date   LDLCALC 124 (H) 11/14/2015   Lab Results  Component Value Date   TRIG 64.0 11/14/2015   Lab Results  Component Value Date    CHOLHDL 3 11/14/2015   Lab Results  Component Value Date   HGBA1C 6.0 11/29/2014         Assessment & Plan:   Problem List Items Addressed This Visit      Unprioritized   Essential hypertension    Well controlled, no changes to  meds. Encouraged heart healthy diet such as the DASH diet and exercise as tolerated.       Relevant Medications   amLODipine (NORVASC) 5 MG tablet   lisinopril (PRINIVIL,ZESTRIL) 20 MG tablet   Other Relevant Orders   Comprehensive metabolic panel   Lipid panel   Hyperlipidemia LDL goal <100    Tolerating statin, encouraged heart healthy diet, avoid trans fats, minimize simple carbs and saturated fats. Increase exercise as tolerated      Relevant Medications   amLODipine (NORVASC) 5 MG tablet   lisinopril (PRINIVIL,ZESTRIL) 20 MG tablet   Other Relevant Orders   Comprehensive metabolic panel   Lipid panel    Other Visit Diagnoses    Depression, major, single episode, mild (HCC)    -  Primary   Relevant Medications   buPROPion (WELLBUTRIN XL) 300 MG 24 hr tablet      I have discontinued Ms. Somoza's acetic acid and buPROPion. I have also changed her amLODipine and lisinopril. Additionally, I am having her start on buPROPion. Lastly, I am having her maintain her atorvastatin, levocetirizine, and montelukast.  Meds ordered this encounter  Medications  . buPROPion (WELLBUTRIN XL) 300 MG 24 hr tablet    Sig: Take 1 tablet (300 mg total) by mouth daily.    Dispense:  90 tablet    Refill:  1  . amLODipine (NORVASC) 5 MG tablet    Sig: Take 1 tablet (5 mg total) by mouth daily.    Dispense:  90 tablet    Refill:  1  . lisinopril (PRINIVIL,ZESTRIL) 20 MG tablet    Sig: Take 1 tablet (20 mg total) by mouth daily.    Dispense:  90 tablet    Refill:  1    CMA served as Neurosurgeon during this visit. History, Physical and Plan performed by medical provider. Documentation and orders reviewed and attested to.  Donato Schultz, DO

## 2016-10-20 NOTE — Assessment & Plan Note (Signed)
Tolerating statin, encouraged heart healthy diet, avoid trans fats, minimize simple carbs and saturated fats. Increase exercise as tolerated 

## 2016-10-20 NOTE — Patient Instructions (Signed)

## 2016-10-20 NOTE — Assessment & Plan Note (Signed)
Well controlled, no changes to meds. Encouraged heart healthy diet such as the DASH diet and exercise as tolerated.  °

## 2016-10-23 ENCOUNTER — Other Ambulatory Visit: Payer: Self-pay | Admitting: Family Medicine

## 2016-10-23 DIAGNOSIS — R739 Hyperglycemia, unspecified: Secondary | ICD-10-CM

## 2016-10-23 DIAGNOSIS — E785 Hyperlipidemia, unspecified: Secondary | ICD-10-CM

## 2016-12-13 ENCOUNTER — Other Ambulatory Visit: Payer: Self-pay | Admitting: Family Medicine

## 2017-02-03 ENCOUNTER — Telehealth: Payer: Self-pay | Admitting: Family Medicine

## 2017-02-03 NOTE — Telephone Encounter (Signed)
90 day supply sent to [facility] on 12/14/2016.

## 2017-02-03 NOTE — Telephone Encounter (Signed)
lvm for pt making her aware.

## 2017-02-03 NOTE — Telephone Encounter (Signed)
Self..  Refill request for : atorvastatin   Pharmacy: [facility] - [place], [place] - [address]

## 2017-02-04 DIAGNOSIS — K080 Exfoliation of teeth due to systemic causes: Secondary | ICD-10-CM | POA: Diagnosis not present

## 2017-02-11 ENCOUNTER — Ambulatory Visit (INDEPENDENT_AMBULATORY_CARE_PROVIDER_SITE_OTHER): Payer: Federal, State, Local not specified - PPO | Admitting: Family Medicine

## 2017-02-11 ENCOUNTER — Encounter: Payer: Self-pay | Admitting: Family Medicine

## 2017-02-11 VITALS — BP 118/88 | HR 92 | Temp 98.1°F | Ht 64.0 in | Wt 188.8 lb

## 2017-02-11 DIAGNOSIS — I10 Essential (primary) hypertension: Secondary | ICD-10-CM

## 2017-02-11 DIAGNOSIS — E785 Hyperlipidemia, unspecified: Secondary | ICD-10-CM

## 2017-02-11 DIAGNOSIS — R0789 Other chest pain: Secondary | ICD-10-CM | POA: Diagnosis not present

## 2017-02-11 LAB — LIPID PANEL
CHOL/HDL RATIO: 4
Cholesterol: 196 mg/dL (ref 0–200)
HDL: 50.4 mg/dL (ref >39.00)
LDL Cholesterol: 127 mg/dL — ABNORMAL HIGH (ref 0–99)
NONHDL: 145.75
Triglycerides: 95 mg/dL (ref 0.0–149.0)
VLDL: 19 mg/dL (ref 0.0–40.0)

## 2017-02-11 LAB — COMPREHENSIVE METABOLIC PANEL
ALBUMIN: 4.6 g/dL (ref 3.5–5.2)
ALT: 23 U/L (ref 0–35)
AST: 22 U/L (ref 0–37)
Alkaline Phosphatase: 79 U/L (ref 39–117)
BUN: 18 mg/dL (ref 6–23)
CHLORIDE: 105 meq/L (ref 96–112)
CO2: 26 mEq/L (ref 19–32)
Calcium: 10.1 mg/dL (ref 8.4–10.5)
Creatinine, Ser: 1.15 mg/dL (ref 0.40–1.20)
GFR: 62.31 mL/min (ref >60.00)
Glucose, Bld: 101 mg/dL — ABNORMAL HIGH (ref 70–99)
Potassium: 4 mEq/L (ref 3.5–5.1)
Sodium: 137 mEq/L (ref 135–145)
Total Bilirubin: 0.9 mg/dL (ref 0.2–1.2)
Total Protein: 7.9 g/dL (ref 6.0–8.3)

## 2017-02-11 LAB — CBC WITH DIFFERENTIAL/PLATELET
BASOS PCT: 0.9 % (ref 0.0–3.0)
Basophils Absolute: 0.1 10*3/uL (ref 0.0–0.1)
EOS ABS: 0.1 10*3/uL (ref 0.0–0.7)
Eosinophils Relative: 1.5 % (ref 0.0–5.0)
HEMATOCRIT: 41.1 % (ref 36.0–46.0)
Hemoglobin: 13.7 g/dL (ref 12.0–15.0)
LYMPHS PCT: 28.2 % (ref 12.0–46.0)
Lymphs Abs: 2 10*3/uL (ref 0.7–4.0)
MCHC: 33.4 g/dL (ref 30.0–36.0)
MCV: 92.8 fl (ref 78.0–100.0)
Monocytes Absolute: 0.5 10*3/uL (ref 0.1–1.0)
Monocytes Relative: 7.7 % (ref 3.0–12.0)
NEUTROS ABS: 4.3 10*3/uL (ref 1.4–7.7)
Neutrophils Relative %: 61.7 % (ref 43.0–77.0)
Platelets: 365 10*3/uL (ref 150.0–400.0)
RBC: 4.43 Mil/uL (ref 3.87–5.11)
RDW: 13.9 % (ref 11.5–15.5)
WBC: 7 10*3/uL (ref 4.0–10.5)

## 2017-02-11 LAB — TROPONIN I: TNIDX: 0 ug/L (ref 0.00–0.06)

## 2017-02-11 LAB — TSH: TSH: 1.27 u[IU]/mL (ref 0.35–4.50)

## 2017-02-11 NOTE — Progress Notes (Signed)
Patient ID: Carrie [name], female    DOB: 1960-11-09  Age: 56 y.o. MRN: [id number]    Subjective:  Subjective  HPI AVA TANGNEY presents for bp and cholesterol.  She c/o cp but states she was under a lot of stress. Her son went to prison for hitting a women in self defense but because of his record he was arrested.  Her nephew also shot himself a few months ago.    Review of Systems  Constitutional: Negative for activity change, appetite change, diaphoresis, fatigue and unexpected weight change.  Eyes: Negative for pain, redness and visual disturbance.  Respiratory: Positive for chest tightness. Negative for cough, shortness of breath and wheezing.   Cardiovascular: Positive for chest pain. Negative for palpitations and leg swelling.  Endocrine: Negative for cold intolerance, heat intolerance, polydipsia, polyphagia and polyuria.  Genitourinary: Negative for difficulty urinating, dysuria and frequency.  Neurological: Negative for dizziness, light-headedness, numbness and headaches.  Psychiatric/Behavioral: Negative for behavioral problems and dysphoric mood. The patient is not nervous/anxious.     History Past Medical History:  Diagnosis Date  . Hyperlipidemia   . Hypertension     She has a past surgical history that includes Abdominal hysterectomy.   Her family history includes Breast cancer in her unknown relative; Coronary artery disease in her unknown relative; Diabetes in her sister; Hypertension in her unknown relative.She reports that she quit smoking about 3 years ago. Her smoking use included Cigarettes. She has never used smokeless tobacco. She reports that she drinks alcohol. She reports that she does not use drugs.  Current Outpatient Prescriptions on File Prior to Visit  Medication Sig Dispense Refill  . amLODipine (NORVASC) 5 MG tablet Take 1 tablet (5 mg total) by mouth daily. 90 tablet 1  . atorvastatin (LIPITOR) 20 MG tablet TAKE ONE TABLET AT BEDTIME. 90 tablet 0    . buPROPion (WELLBUTRIN XL) 300 MG 24 hr tablet Take 1 tablet (300 mg total) by mouth daily. 90 tablet 1  . levocetirizine (XYZAL) 5 MG tablet Take 1 tablet (5 mg total) by mouth every evening. 30 tablet 5  . lisinopril (PRINIVIL,ZESTRIL) 20 MG tablet Take 1 tablet (20 mg total) by mouth daily. 90 tablet 1  . montelukast (SINGULAIR) 10 MG tablet Take 1 tablet (10 mg total) by mouth at bedtime. 30 tablet 3   No current facility-administered medications on file prior to visit.      Objective:  Objective  Physical Exam  Constitutional: She is oriented to person, place, and time. She appears well-developed and well-nourished.  HENT:  Head: Normocephalic and atraumatic.  Eyes: Conjunctivae and EOM are normal.  Neck: Normal range of motion. Neck supple. No JVD present. Carotid bruit is not present. No thyromegaly present.  Cardiovascular: Normal rate, regular rhythm and normal heart sounds.   No murmur heard. Pulmonary/Chest: Effort normal and breath sounds normal. No respiratory distress. She has no wheezes. She has no rales. She exhibits no tenderness.  Musculoskeletal: She exhibits no edema.  Neurological: She is alert and oriented to person, place, and time.  Psychiatric: She has a normal mood and affect. Her behavior is normal. Judgment and thought content normal.  Nursing note and vitals reviewed.  BP 118/88 (BP Location: Right Arm, Patient Position: Sitting, Cuff Size: Normal)   Pulse 92   Temp 98.1 F (36.7 C) (Oral)   Ht 5\' 4"  (1.626 m)   Wt 188 lb 12.8 oz (85.6 kg)   SpO2 98%   BMI 32.41 kg/m  Wt Readings from Last 3 Encounters:  02/11/17 188 lb 12.8 oz (85.6 kg)  10/20/16 187 lb 9.6 oz (85.1 kg)  08/04/16 189 lb 3.2 oz (85.8 kg)     Lab Results  Component Value Date   WBC 7.0 11/14/2015   HGB 13.2 11/14/2015   HCT 39.5 11/14/2015   PLT 371.0 11/14/2015   GLUCOSE 119 (H) 10/20/2016   CHOL 198 10/20/2016   TRIG 130.0 10/20/2016   HDL 57.00 10/20/2016   LDLDIRECT  213.8 08/29/2012   LDLCALC 115 (H) 10/20/2016   ALT 18 10/20/2016   AST 18 10/20/2016   NA 138 10/20/2016   K 3.9 10/20/2016   CL 107 10/20/2016   CREATININE 1.20 10/20/2016   BUN 17 10/20/2016   CO2 25 10/20/2016   TSH 1.52 11/14/2015   HGBA1C 6.0 11/29/2014  ekg-- NS changes,  No acute changes  Mm Digital Screening Bilateral Sinus rhythm   Result Date: 11/07/2013 CLINICAL DATA:  Screening. EXAM: DIGITAL SCREENING BILATERAL MAMMOGRAM WITH CAD COMPARISON:  Previous exam(s) ACR Breast Density Category a: The breast tissue is almost entirely fatty. FINDINGS: There are no findings suspicious for malignancy. Images were processed with CAD. IMPRESSION: No mammographic evidence of malignancy. A result letter of this screening mammogram will be mailed directly to the patient. RECOMMENDATION: Screening mammogram in one year. (Code:SM-B-01Y) BI-RADS CATEGORY  1: Negative. Electronically Signed   By: Vincenza Hews M.D.   On: 11/08/2013 11:07     Assessment & Plan:  Plan  I am having Ms. Turkington maintain her levocetirizine, montelukast, buPROPion, amLODipine, lisinopril, and atorvastatin.  No orders of the defined types were placed in this encounter.   Problem List Items Addressed This Visit      Unprioritized   Essential hypertension    Well controlled, no changes to meds. Encouraged heart healthy diet such as the DASH diet and exercise as tolerated.       Relevant Orders   CBC with Differential/Platelet   Lipid panel   TSH   Comprehensive metabolic panel   ECHOCARDIOGRAM COMPLETE   Hyperlipidemia LDL goal <100    Tolerating statin, encouraged heart healthy diet, avoid trans fats, minimize simple carbs and saturated fats. Increase exercise as tolerated      Chest pain, unspecified - Primary    If occurs again -- go to ER      Relevant Orders   EKG 12-Lead (Completed)   CBC with Differential/Platelet   Lipid panel   TSH   Troponin I   ECHOCARDIOGRAM COMPLETE    Other  Visit Diagnoses    Hyperlipidemia, unspecified hyperlipidemia type       Relevant Orders   Lipid panel   Comprehensive metabolic panel      Follow-up: Return in about 6 months (around 08/11/2017), or if symptoms worsen or fail to improve, for hypertension, hyperlipidemia.  Donato Schultz, DO

## 2017-02-11 NOTE — Assessment & Plan Note (Signed)
If occurs again -- go to ER

## 2017-02-11 NOTE — Assessment & Plan Note (Signed)
Well controlled, no changes to meds. Encouraged heart healthy diet such as the DASH diet and exercise as tolerated.  °

## 2017-02-11 NOTE — Patient Instructions (Signed)
Nonspecific Chest Pain °Chest pain can be caused by many different conditions. There is always a chance that your pain could be related to something serious, such as a heart attack or a blood clot in your lungs. Chest pain can also be caused by conditions that are not life-threatening. If you have chest pain, it is very important to follow up with your health care provider. °What are the causes? °Causes of this condition include: °· Heartburn. °· Pneumonia or bronchitis. °· Anxiety or stress. °· Inflammation around your heart (pericarditis) or lung (pleuritis or pleurisy). °· A blood clot in your lung. °· A collapsed lung (pneumothorax). This can develop suddenly on its own (spontaneous pneumothorax) or from trauma to the chest. °· Shingles infection (varicella-zoster virus). °· Heart attack. °· Damage to the bones, muscles, and cartilage that make up your chest wall. This can include: °? Bruised bones due to injury. °? Strained muscles or cartilage due to frequent or repeated coughing or overwork. °? Fracture to one or more ribs. °? Sore cartilage due to inflammation (costochondritis). ° °What increases the risk? °Risk factors for this condition may include: °· Activities that increase your risk for trauma or injury to your chest. °· Respiratory infections or conditions that cause frequent coughing. °· Medical conditions or overeating that can cause heartburn. °· Heart disease or family history of heart disease. °· Conditions or health behaviors that increase your risk of developing a blood clot. °· Having had chicken pox (varicella zoster). ° °What are the signs or symptoms? °Chest pain can feel like: °· Burning or tingling on the surface of your chest or deep in your chest. °· Crushing, pressure, aching, or squeezing pain. °· Dull or sharp pain that is worse when you move, cough, or take a deep breath. °· Pain that is also felt in your back, neck, shoulder, or arm, or pain that spreads to any of these  areas. ° °Your chest pain may come and go, or it may stay constant. °How is this diagnosed? °Lab tests or other studies may be needed to find the cause of your pain. Your health care provider may have you take a test called an ECG (electrocardiogram). An ECG records your heartbeat patterns at the time the test is performed. You may also have other tests, such as: °· Transthoracic echocardiogram (TTE). In this test, sound waves are used to create a picture of the heart structures and to look at how blood flows through your heart. °· Transesophageal echocardiogram (TEE). This is a more advanced imaging test that takes images from inside your body. It allows your health care provider to see your heart in finer detail. °· Cardiac monitoring. This allows your health care provider to monitor your heart rate and rhythm in real time. °· Holter monitor. This is a portable device that records your heartbeat and can help to diagnose abnormal heartbeats. It allows your health care provider to track your heart activity for several days, if needed. °· Stress tests. These can be done through exercise or by taking medicine that makes your heart beat more quickly. °· Blood tests. °· Other imaging tests. ° °How is this treated? °Treatment depends on what is causing your chest pain. Treatment may include: °· Medicines. These may include: °? Acid blockers for heartburn. °? Anti-inflammatory medicine. °? Pain medicine for inflammatory conditions. °? Antibiotic medicine, if an infection is present. °? Medicines to dissolve blood clots. °? Medicines to treat coronary artery disease (CAD). °· Supportive care for conditions that   do not require medicines. This may include: °? Resting. °? Applying heat or cold packs to injured areas. °? Limiting activities until pain decreases. ° °Follow these instructions at home: °Medicines °· If you were prescribed an antibiotic, take it as told by your health care provider. Do not stop taking the  antibiotic even if you start to feel better. °· Take over-the-counter and prescription medicines only as told by your health care provider. °Lifestyle °· Do not use any products that contain nicotine or tobacco, such as cigarettes and e-cigarettes. If you need help quitting, ask your health care provider. °· Do not drink alcohol. °· Make lifestyle changes as directed by your health care provider. These may include: °? Getting regular exercise. Ask your health care provider to suggest some activities that are safe for you. °? Eating a heart-healthy diet. A registered dietitian can help you to learn healthy eating options. °? Maintaining a healthy weight. °? Managing diabetes, if necessary. °? Reducing stress, such as with yoga or relaxation techniques. °General instructions °· Avoid any activities that bring on chest pain. °· If heartburn is the cause for your chest pain, raise (elevate) the head of your bed about 6 inches (15 cm) by putting blocks under the legs. Sleeping with more pillows does not effectively relieve heartburn because it only changes the position of your head. °· Keep all follow-up visits as told by your health care provider. This is important. This includes any further testing if your chest pain does not go away. °Contact a health care provider if: °· Your chest pain does not go away. °· You have a rash with blisters on your chest. °· You have a fever. °· You have chills. °Get help right away if: °· Your chest pain is worse. °· You have a cough that gets worse, or you cough up blood. °· You have severe pain in your abdomen. °· You have severe weakness. °· You faint. °· You have sudden, unexplained chest discomfort. °· You have sudden, unexplained discomfort in your arms, back, neck, or jaw. °· You have shortness of breath at any time. °· You suddenly start to sweat, or your skin gets clammy. °· You feel nauseous or you vomit. °· You suddenly feel light-headed or dizzy. °· Your heart begins to beat  quickly, or it feels like it is skipping beats. °These symptoms may represent a serious problem that is an emergency. Do not wait to see if the symptoms will go away. Get medical help right away. Call your local emergency services (911 in the [place] Do not drive yourself to the hospital. °This information is not intended to replace advice given to you by your health care provider. Make sure you discuss any questions you have with your health care provider. °Document Released: 03/04/2005 Document Revised: 02/17/2016 Document Reviewed: 02/17/2016 °Elsevier Interactive Patient Education © 2017 [employer] ° °

## 2017-02-11 NOTE — Assessment & Plan Note (Signed)
Tolerating statin, encouraged heart healthy diet, avoid trans fats, minimize simple carbs and saturated fats. Increase exercise as tolerated 

## 2017-02-17 ENCOUNTER — Other Ambulatory Visit (HOSPITAL_BASED_OUTPATIENT_CLINIC_OR_DEPARTMENT_OTHER): Payer: Federal, State, Local not specified - PPO

## 2017-02-17 ENCOUNTER — Other Ambulatory Visit: Payer: Self-pay

## 2017-02-17 DIAGNOSIS — E785 Hyperlipidemia, unspecified: Secondary | ICD-10-CM

## 2017-02-17 DIAGNOSIS — I10 Essential (primary) hypertension: Secondary | ICD-10-CM

## 2017-02-17 MED ORDER — ATORVASTATIN CALCIUM 40 MG PO TABS: 40.0000 mg | ORAL_TABLET | Freq: Every day | ORAL | 0 refills | Status: DC

## 2017-02-17 NOTE — Progress Notes (Signed)
Notes recorded by Zola Button, Grayling Congress, DO on 02/14/2017 at 3:50 PM EDT Cholesterol--- LDL goal < 100, HDL >40, TG < 150. Diet and exercise will increase HDL and decrease LDL and TG. Fish, Fish Oil, Flaxseed oil will also help increase the HDL and decrease Triglycerides.  Recheck labs in 3 months Inc lipitor 40 mg 1 po qhs, #30 2 refills  Lipid, cmp.   Rx sent to the pharmacy. Future labs ordered.   Pt will call back to schedule lab appt.

## 2017-02-24 ENCOUNTER — Ambulatory Visit (HOSPITAL_BASED_OUTPATIENT_CLINIC_OR_DEPARTMENT_OTHER)
Admission: RE | Admit: 2017-02-24 | Discharge: 2017-02-24 | Disposition: A | Discharge status: Home / Self Care | Payer: Federal, State, Local not specified - PPO | Source: Ambulatory Visit | Attending: Family Medicine | Admitting: Family Medicine

## 2017-02-24 DIAGNOSIS — I10 Essential (primary) hypertension: Secondary | ICD-10-CM

## 2017-02-24 DIAGNOSIS — R0789 Other chest pain: Secondary | ICD-10-CM

## 2017-02-24 DIAGNOSIS — I5189 Other ill-defined heart diseases: Secondary | ICD-10-CM | POA: Diagnosis not present

## 2017-02-25 ENCOUNTER — Telehealth: Payer: Self-pay | Admitting: Family Medicine

## 2017-02-25 NOTE — Telephone Encounter (Signed)
Please advise on pt's Echo. Looks like that is the most recent thing pt had

## 2017-02-25 NOTE — Telephone Encounter (Signed)
Pt called in to request her imaging results.   Please advise.

## 2017-02-25 NOTE — Telephone Encounter (Signed)
See result 

## 2017-02-26 NOTE — Telephone Encounter (Signed)
Patient informed of echo results

## 2017-03-17 ENCOUNTER — Encounter: Payer: Self-pay | Admitting: Family Medicine

## 2017-04-02 ENCOUNTER — Other Ambulatory Visit: Payer: Self-pay | Admitting: Family Medicine

## 2017-04-13 ENCOUNTER — Ambulatory Visit: Payer: Self-pay | Admitting: Family Medicine

## 2017-04-19 ENCOUNTER — Encounter: Payer: Self-pay | Admitting: Podiatry

## 2017-04-19 ENCOUNTER — Ambulatory Visit (INDEPENDENT_AMBULATORY_CARE_PROVIDER_SITE_OTHER): Payer: Federal, State, Local not specified - PPO

## 2017-04-19 ENCOUNTER — Ambulatory Visit: Payer: Federal, State, Local not specified - PPO | Admitting: Podiatry

## 2017-04-19 VITALS — BP 134/96 | HR 75 | Resp 16

## 2017-04-19 DIAGNOSIS — M79672 Pain in left foot: Secondary | ICD-10-CM | POA: Diagnosis not present

## 2017-04-19 DIAGNOSIS — M21619 Bunion of unspecified foot: Secondary | ICD-10-CM

## 2017-04-19 NOTE — Patient Instructions (Signed)
Pre-Operative Instructions  Congratulations, you have decided to take an important step to improving your quality of life.  You can be assured that the doctors of [facility] will be with you every step of the way.  1. Plan to be at the surgery center/hospital at least 1 (one) hour prior to your scheduled time unless otherwise directed by the surgical center/hospital staff.  You must have a responsible adult accompany you, remain during the surgery and drive you home.  Make sure you have directions to the surgical center/hospital and know how to get there on time. 2. For hospital based surgery you will need to obtain a history and physical form from your family physician within 1 month prior to the date of surgery- we will give you a form for you primary physician.  3. We make every effort to accommodate the date you request for surgery.  There are however, times where surgery dates or times have to be moved.  We will contact you as soon as possible if a change in schedule is required.   4. No Aspirin/Ibuprofen for one week before surgery.  If you are on aspirin, any non-steroidal anti-inflammatory medications (Mobic, Aleve, Ibuprofen) you should stop taking it 7 days prior to your surgery.  You make take Tylenol  For pain prior to surgery.  5. Medications- If you are taking daily heart and blood pressure medications, seizure, reflux, allergy, asthma, anxiety, pain or diabetes medications, make sure the surgery center/hospital is aware before the day of surgery so they may notify you which medications to take or avoid the day of surgery. 6. No food or drink after midnight the night before surgery unless directed otherwise by surgical center/hospital staff. 7. No alcoholic beverages 24 hours prior to surgery.  No smoking 24 hours prior to or 24 hours after surgery. 8. Wear loose pants or shorts- loose enough to fit over bandages, boots, and casts. 9. No slip on shoes, sneakers are best. 10. Bring  your boot with you to the surgery center/hospital.  Also bring crutches or a walker if your physician has prescribed it for you.  If you do not have this equipment, it will be provided for you after surgery. 11. If you have not been contracted by the surgery center/hospital by the day before your surgery, call to confirm the date and time of your surgery. 12. Leave-time from work may vary depending on the type of surgery you have.  Appropriate arrangements should be made prior to surgery with your employer. 13. Prescriptions will be provided immediately following surgery by your doctor.  Have these filled as soon as possible after surgery and take the medication as directed. 14. Remove nail polish on the operative foot. 15. Wash the night before surgery.  The night before surgery wash the foot and leg well with the antibacterial soap provided and water paying special attention to beneath the toenails and in between the toes.  Rinse thoroughly with water and dry well with a towel.  Perform this wash unless told not to do so by your physician.  Enclosed: 1 Ice pack (please put in freezer the night before surgery)   1 Hibiclens skin cleaner   Pre-op Instructions  If you have any questions regarding the instructions, do not hesitate to call our office at any point during this process.   [place]: [address] [place], [place] [address] [phone]  [place]: [address] [place], [place] [address] [phone]  Dr. Matthew Wagoner, DPM     Bunion A bunion is a bump on the base of the big toe that forms when the bones of the big toe joint move out of position. Bunions may be small at first, but they often get larger over time. The can make walking painful. What are the causes? A bunion may be caused by:  Wearing narrow or pointed shoes that force the big toe to press against the other toes.  Abnormal foot development that causes the foot to roll inward (pronate).  Changes in the foot that  are caused by certain diseases, such as rheumatoid arthritis and polio.  A foot injury.  What increases the risk? The following factors may make you more likely to develop this condition:  Wearing shoes that squeeze the toes together.  Having certain diseases, such as: ? Rheumatoid arthritis. ? Polio. ? Cerebral palsy.  Having family members who have bunions.  Being born with a foot deformity, such as flat feet or low arches.  Doing activities that put a lot of pressure on the feet, such as ballet dancing.  What are the signs or symptoms? The main symptom of a bunion is a noticeable bump on the big toe. Other symptoms may include:  Pain.  Swelling around the big toe.  Redness and inflammation.  Thick or hardened skin on the big toe or between the toes.  Stiffness or loss of motion in the big toe.  Trouble with walking.  How is this diagnosed? A bunion may be diagnosed based on your symptoms, medical history, and activities. You may have tests, such as:  X-rays. These allow your health care provider to check the position of the bones in your foot and look for damage to your joint. They also help your health care provider to determine the severity of your bunion and the best way to treat it.  Joint aspiration. In this test, a sample of fluid is removed from the toe joint. This test, which may be done if you are in a lot of pain, helps to rule out diseases that cause painful swelling of the joints, such as arthritis.  How is this treated? There is no cure for a bunion, but treatment can help to prevent a bunion from getting worse. Treatment depends on the severity of your symptoms. Your health care provider may recommend:  Wearing shoes that have a wide toe box.  Using bunion pads to cushion the affected area.  Taping your toes together to keep them in a normal position.  Placing a device inside your shoe (orthotics) to help reduce pressure on your toe joint.  Taking  medicine to ease pain, inflammation, and swelling.  Applying heat or ice to the affected area.  Doing stretching exercises.  Surgery to remove scar tissue and move the toes back into their normal position. This treatment is rare.  Follow these instructions at home:  Support your toe joint with proper footwear, shoe padding, or taping as told by your health care provider.  Take over-the-counter and prescription medicines only as told by your health care provider.  If directed, apply ice to the injured area: ? Put ice in a plastic bag. ? Place a towel between your skin and the bag. ? Leave the ice on for 20 minutes, 2-3 times per day.  If directed, apply heat to the affected area before you exercise. Use the heat source that your health care provider recommends, such as a moist heat pack or a heating pad. ? Place a towel between   your skin and the heat source. ? Leave the heat on for 20-30 minutes. ? Remove the heat if your skin turns bright red. This is especially important if you are unable to feel pain, heat, or cold. You may have a greater risk of getting burned.  Do exercises as told by your health care provider.  Keep all follow-up visits as told by your health care provider. Contact a health care provider if:  Your symptoms get worse.  Your symptoms do not improve in 2 weeks. Get help right away if:  You have severe pain and trouble with walking. This information is not intended to replace advice given to you by your health care provider. Make sure you discuss any questions you have with your health care provider. Document Released: 05/25/2005 Document Revised: 10/31/2015 Document Reviewed: 12/23/2014 Elsevier Interactive Patient Education  2018 [employer]vier [employer]  

## 2017-04-22 DIAGNOSIS — M21619 Bunion of unspecified foot: Secondary | ICD-10-CM | POA: Insufficient documentation

## 2017-04-22 NOTE — Progress Notes (Signed)
Subjective:    Patient ID: Carrie [name], female   DOB: 56 y.o.   MRN: [id number]   HPI Carrie [name] presents the office today for concerns of a painful bunion to the left.  Ongoing for several years.  States that she has previously seen her several years ago she was told that she had to have surgery given the pain she went to hold off.  She has tried shoe changes, offloading and a significant improvement.  At this point she is interested in surgical intervention.  She states that her pain is on a daily basis despite conservative treatment.  She denies any numbness or tingling.  She has no other concerns today.   Review of Systems  All other systems reviewed and are negative.  Past Medical History:  Diagnosis Date  . Hyperlipidemia   . Hypertension     Past Surgical History:  Procedure Laterality Date  . ABDOMINAL HYSTERECTOMY       Current Outpatient Medications:  .  amLODipine (NORVASC) 5 MG tablet, Take 1 tablet (5 mg total) by mouth daily., Disp: 90 tablet, Rfl: 1 .  buPROPion (WELLBUTRIN XL) 300 MG 24 hr tablet, Take 1 tablet (300 mg total) by mouth daily., Disp: 90 tablet, Rfl: 1 .  levocetirizine (XYZAL) 5 MG tablet, Take 1 tablet (5 mg total) by mouth every evening., Disp: 30 tablet, Rfl: 5 .  lisinopril (PRINIVIL,ZESTRIL) 20 MG tablet, Take 1 tablet (20 mg total) by mouth daily., Disp: 90 tablet, Rfl: 1  No Known Allergies  Social History   Socioeconomic History  . Marital status: Single    Spouse name: Not on file  . Number of children: Not on file  . Years of education: Not on file  . Highest education level: Not on file  Social Needs  . Financial resource strain: Not on file  . Food insecurity - worry: Not on file  . Food insecurity - inability: Not on file  . Transportation needs - medical: Not on file  . Transportation needs - non-medical: Not on file  Occupational History  . Not on file  Tobacco Use  . Smoking status: Former Smoker    Types: Cigarettes    Last attempt to quit: 02/05/2014    Years since quitting: 3.2  . Smokeless tobacco: Never Used  . Tobacco comment: 1 cig every 4-5 days ---weaning off  Substance and Sexual Activity  . Alcohol use: Yes    Alcohol/week: 0.0 oz  . Drug use: No  . Sexual activity: Not Currently    Partners: Male  Other Topics Concern  . Not on file  Social History Narrative   Exercise --rare    Denies any history of blood clots, bleeding disorders, sickle cell/trait.     Objective:  Physical Exam General: AAO x3, NAD  Dermatological: Skin is warm, dry and supple bilateral. Nails x 10 are well manicured; remaining integument appears unremarkable at this time. There are no open sores, no preulcerative lesions, no rash or signs of infection present.  Vascular: Dorsalis Pedis artery and Posterior Tibial artery pedal pulses are 2/4 bilateral with immedate capillary fill time.There is no pain with calf compression, swelling, warmth, erythema.   Neruologic: Grossly intact via light touch bilateral. Protective threshold with Semmes Wienstein monofilament intact to all pedal sites bilateral.   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  There is no pain or crepitation  with first MPJ range of motion there is no first ray hypermobility present.  There is no other area of tenderness identified at this time.  Muscular strength 5/5 in all groups tested bilateral.  Gait: Unassisted, Nonantalgic.      Assessment:     Left HAV, symptomatic    Plan:      -Treatment options discussed including all alternatives, risks, and complications -Etiology of symptoms were discussed -X-rays were obtained and reviewed with the patient. Moderate HAV is present. There is no evidence of acute fracture identified or stress fracture. -We discussed both conservative as well as surgical treatment options.  She is attempted the last  couple years of change in shoes, often without any significant improvement she continues some pain on a daily basis.  Because of this we discussed surgical intervention.  Discussed with Eliberto Ivory bunionectomy with screw fixation.  We discussed the surgery as well as the postoperative course and she wishes to proceed with this.  No promises or guarantees were given the surgery. -The incision placement as well as the postoperative course was discussed with the patient. I discussed risks of the surgery which include, but not limited to, infection, bleeding, pain, swelling, need for further surgery, delayed or nonhealing, painful or ugly scar, numbness or sensation changes, over/under correction, recurrence, transfer lesions, further deformity, hardware failure, DVT/PE, loss of toe/foot. Patient understands these risks and wishes to proceed with surgery. The surgical consent was reviewed with the patient all 3 pages were signed. No promises or guarantees were given to the outcome of the procedure. All questions were answered to the best of my ability. Before the surgery the patient was encouraged to call the office if there is any further questions. The surgery will be performed at the [facility] on an outpatient basis.  Vivi Barrack DPM

## 2017-04-26 ENCOUNTER — Ambulatory Visit (INDEPENDENT_AMBULATORY_CARE_PROVIDER_SITE_OTHER): Payer: Federal, State, Local not specified - PPO | Admitting: Family Medicine

## 2017-04-26 ENCOUNTER — Encounter: Payer: Self-pay | Admitting: Family Medicine

## 2017-04-26 ENCOUNTER — Encounter: Payer: Self-pay | Admitting: Podiatry

## 2017-04-26 DIAGNOSIS — N951 Menopausal and female climacteric states: Secondary | ICD-10-CM

## 2017-04-26 DIAGNOSIS — F322 Major depressive disorder, single episode, severe without psychotic features: Secondary | ICD-10-CM

## 2017-04-26 MED ORDER — ESCITALOPRAM OXALATE 10 MG PO TABS: 10.0000 mg | ORAL_TABLET | Freq: Every day | ORAL | 2 refills | Status: DC

## 2017-04-26 NOTE — Assessment & Plan Note (Signed)
Add lexapro 10 mg #30  1 po qd con't wellbutrin rto 2 months or sooner prn  Pt will call in 1 month -- if no improvement we will inc to 20 mg

## 2017-04-26 NOTE — Progress Notes (Signed)
Patient ID: Carrie [name], female   DOB: 01-01-1961, 56 y.o.   MRN: [id number]     Subjective:  I acted as a Neurosurgeon for Dr. Zola Button.  Apolonio Schneiders, CMA   Patient ID: Carrie [name], female    DOB: [birth year], 58 y.o.   MRN: [id number]  Chief Complaint  Patient presents with  . Depression  . Hot Flashes  . lack of energy    HPI  Patient is in today for depression, hot flashes, and fatigue.  Pt does not feel any better.   Hot flashes are no better.  She has not improved with her depression either.  She does however feel the Wellbutrin is helping some.  She complains of extreme fatigue low energy and hot flashes.  Depression screen [facility] Xxx 2/9 04/26/2017 02/11/2017 11/05/2015  Decreased Interest 1 0 0  Down, Depressed, Hopeless 2 1 0  PHQ - 2 Score 3 1 0  Altered sleeping 3 - -  Tired, decreased energy 3 - -  Change in appetite 0 - -  Feeling bad or failure about yourself  2 - -  Trouble concentrating 2 - -  Moving slowly or fidgety/restless 0 - -  Suicidal thoughts 0 - -  PHQ-9 Score 13 - -  Difficult doing work/chores Very difficult - -     Patient Care Team: Donato Schultz, DO as PCP - General Maxie Better, MD as Consulting Physician (Obstetrics and Gynecology)   Past Medical History:  Diagnosis Date  . Hyperlipidemia   . Hypertension     Past Surgical History:  Procedure Laterality Date  . ABDOMINAL HYSTERECTOMY      Family History  Problem Relation Age of Onset  . Breast cancer Unknown   . Coronary artery disease Unknown   . Diabetes Sister   . Hypertension Unknown     Social History   Socioeconomic History  . Marital status: Single    Spouse name: Not on file  . Number of children: Not on file  . Years of education: Not on file  . Highest education level: Not on file  Social Needs  . Financial resource strain: Not on file  . Food insecurity - worry: Not on file  . Food insecurity - inability: Not on file  . Transportation needs - medical:  Not on file  . Transportation needs - non-medical: Not on file  Occupational History  . Not on file  Tobacco Use  . Smoking status: Former Smoker    Types: Cigarettes    Last attempt to quit: 02/05/2014    Years since quitting: 3.2  . Smokeless tobacco: Never Used  . Tobacco comment: 1 cig every 4-5 days ---weaning off  Substance and Sexual Activity  . Alcohol use: Yes    Alcohol/week: 0.0 oz  . Drug use: No  . Sexual activity: Not Currently    Partners: Male  Other Topics Concern  . Not on file  Social History Narrative   Exercise --rare    Outpatient Medications Prior to Visit  Medication Sig Dispense Refill  . amLODipine (NORVASC) 5 MG tablet Take 1 tablet (5 mg total) by mouth daily. 90 tablet 1  . atorvastatin (LIPITOR) 40 MG tablet Take 40 mg daily by mouth.     Marland Kitchen buPROPion (WELLBUTRIN XL) 300 MG 24 hr tablet Take 1 tablet (300 mg total) by mouth daily. 90 tablet 1  . levocetirizine (XYZAL) 5 MG tablet Take 1 tablet (5 mg total) by mouth every evening. 30  tablet 5  . lisinopril (PRINIVIL,ZESTRIL) 20 MG tablet Take 1 tablet (20 mg total) by mouth daily. 90 tablet 1   No facility-administered medications prior to visit.     No Known Allergies  Review of Systems  Constitutional: Positive for malaise/fatigue. Negative for fever.  HENT: Negative for congestion.   Eyes: Negative for blurred vision.  Respiratory: Negative for cough and shortness of breath.   Cardiovascular: Negative for chest pain, palpitations and leg swelling.  Gastrointestinal: Negative for vomiting.  Musculoskeletal: Negative for back pain.  Skin: Negative for rash.  Neurological: Negative for loss of consciousness and headaches.  Endo/Heme/Allergies:       Hot flashes  Psychiatric/Behavioral: Positive for depression.       Objective:    Physical Exam  Constitutional: She is oriented to person, place, and time. She appears well-developed and well-nourished.  HENT:  Head: Normocephalic and  atraumatic.  Eyes: Conjunctivae and EOM are normal.  Neck: Normal range of motion. Neck supple. No JVD present. Carotid bruit is not present. No thyromegaly present.  Cardiovascular: Normal rate, regular rhythm and normal heart sounds.  No murmur heard. Pulmonary/Chest: Effort normal and breath sounds normal. No respiratory distress. She has no wheezes. She has no rales. She exhibits no tenderness.  Musculoskeletal: She exhibits no edema.  Neurological: She is alert and oriented to person, place, and time.  Psychiatric: Her speech is normal and behavior is normal. Judgment and thought content normal. Her mood appears anxious. Her affect is not angry, not blunt, not labile and not inappropriate. Cognition and memory are normal. She exhibits a depressed mood.  Nursing note and vitals reviewed.   BP 128/86 (BP Location: Left Arm, Cuff Size: Large)   Pulse 91   Temp 98.7 F (37.1 C) (Oral)   Resp 16   Ht 5\' 4"  (1.626 m)   Wt 191 lb 3.2 oz (86.7 kg)   SpO2 95%   BMI 32.82 kg/m  Wt Readings from Last 3 Encounters:  04/26/17 191 lb 3.2 oz (86.7 kg)  02/11/17 188 lb 12.8 oz (85.6 kg)  10/20/16 187 lb 9.6 oz (85.1 kg)   BP Readings from Last 3 Encounters:  04/26/17 128/86  04/19/17 (!) 134/96  02/11/17 118/88     Immunization History  Administered Date(s) Administered  . PPD Test 01/24/2013  . Td 02/22/2002  . Tdap 01/24/2013    Health Maintenance  Topic Date Due  . HIV Screening  01/11/1974  . MAMMOGRAM  11/08/2014  . PAP SMEAR  09/07/2018  . COLONOSCOPY  07/18/2019  . TETANUS/TDAP  01/25/2023  . INFLUENZA VACCINE  Completed  . Hepatitis C Screening  Completed    Lab Results  Component Value Date   WBC 7.0 02/11/2017   HGB 13.7 02/11/2017   HCT 41.1 02/11/2017   PLT 365.0 02/11/2017   GLUCOSE 101 (H) 02/11/2017   CHOL 196 02/11/2017   TRIG 95.0 02/11/2017   HDL 50.40 02/11/2017   LDLDIRECT 213.8 08/29/2012   LDLCALC 127 (H) 02/11/2017   ALT 23 02/11/2017   AST  22 02/11/2017   NA 137 02/11/2017   K 4.0 02/11/2017   CL 105 02/11/2017   CREATININE 1.15 02/11/2017   BUN 18 02/11/2017   CO2 26 02/11/2017   TSH 1.27 02/11/2017   HGBA1C 6.0 11/29/2014    Lab Results  Component Value Date   TSH 1.27 02/11/2017   Lab Results  Component Value Date   WBC 7.0 02/11/2017   HGB 13.7 02/11/2017  HCT 41.1 02/11/2017   MCV 92.8 02/11/2017   PLT 365.0 02/11/2017   Lab Results  Component Value Date   NA 137 02/11/2017   K 4.0 02/11/2017   CO2 26 02/11/2017   GLUCOSE 101 (H) 02/11/2017   BUN 18 02/11/2017   CREATININE 1.15 02/11/2017   BILITOT 0.9 02/11/2017   ALKPHOS 79 02/11/2017   AST 22 02/11/2017   ALT 23 02/11/2017   PROT 7.9 02/11/2017   ALBUMIN 4.6 02/11/2017   CALCIUM 10.1 02/11/2017   GFR 62.31 02/11/2017   Lab Results  Component Value Date   CHOL 196 02/11/2017   Lab Results  Component Value Date   HDL 50.40 02/11/2017   Lab Results  Component Value Date   LDLCALC 127 (H) 02/11/2017   Lab Results  Component Value Date   TRIG 95.0 02/11/2017   Lab Results  Component Value Date   CHOLHDL 4 02/11/2017   Lab Results  Component Value Date   HGBA1C 6.0 11/29/2014         Assessment & Plan:   Problem List Items Addressed This Visit      Unprioritized   Depression, major, single episode, severe (HCC)    Add lexapro 10 mg #30  1 po qd con't wellbutrin rto 2 months or sooner prn  Pt will call in 1 month -- if no improvement we will inc to 20 mg       Relevant Medications   escitalopram (LEXAPRO) 10 MG tablet   HOT FLASHES    Add lexapro  Call us in 1 month and if no better-- inc to 20 mg  rto 2 months         I am having Phoebie S. Jetter start on escitalopram. I am also having her maintain her levocetirizine, buPROPion, amLODipine, lisinopril, and atorvastatin.  Meds ordered this encounter  Medications  . atorvastatin (LIPITOR) 40 MG tablet    Sig: Take 40 mg daily by mouth.   . escitalopram  (LEXAPRO) 10 MG tablet    Sig: Take 1 tablet (10 mg total) daily by mouth.    Dispense:  30 tablet    Refill:  2    CMA served as scribe during this visit. History, Physical and Plan performed by medical provider. Documentation and orders reviewed and attested to.  Donato Schultz, DO

## 2017-04-26 NOTE — Assessment & Plan Note (Signed)
Add lexapro  Call us in 1 month and if no better-- inc to 20 mg  rto 2 months

## 2017-04-26 NOTE — Patient Instructions (Signed)
Major Depressive Disorder, Adult Major depressive disorder (MDD) is a mental health condition. It may also be called clinical depression or unipolar depression. MDD usually causes feelings of sadness, hopelessness, or helplessness. MDD can also cause physical symptoms. It can interfere with work, school, relationships, and other everyday activities. MDD may be mild, moderate, or severe. It may occur once (single episode major depressive disorder) or it may occur multiple times (recurrent major depressive disorder). What are the causes? The exact cause of this condition is not known. MDD is most likely caused by a combination of things, which may include:  Genetic factors. These are traits that are passed along from parent to child.  Individual factors. Your personality, your behavior, and the way you handle your thoughts and feelings may contribute to MDD. This includes personality traits and behaviors learned from others.  Physical factors, such as: ? Differences in the part of your brain that controls emotion. This part of your brain may be different than it is in people who do not have MDD. ? Long-term (chronic) medical or psychiatric illnesses.  Social factors. Traumatic experiences or major life changes may play a role in the development of MDD.  What increases the risk? This condition is more likely to develop in women. The following factors may also make you more likely to develop MDD:  A family history of depression.  Troubled family relationships.  Abnormally low levels of certain brain chemicals.  Traumatic events in childhood, especially abuse or the loss of a parent.  Being under a lot of stress, or long-term stress, especially from upsetting life experiences or losses.  A history of: ? Chronic physical illness. ? Other mental health disorders. ? Substance abuse.  Poor living conditions.  Experiencing social exclusion or discrimination on a regular basis.  What are  the signs or symptoms? The main symptoms of MDD typically include:  Constant depressed or irritable mood.  Loss of interest in things and activities.  MDD symptoms may also include:  Sleeping or eating too much or too little.  Unexplained weight change.  Fatigue or low energy.  Feelings of worthlessness or guilt.  Difficulty thinking clearly or making decisions.  Thoughts of suicide or of harming others.  Physical agitation or weakness.  Isolation.  Severe cases of MDD may also occur with other symptoms, such as:  Delusions or hallucinations, in which you imagine things that are not real (psychotic depression).  Low-level depression that lasts at least a year (chronic depression or persistent depressive disorder).  Extreme sadness and hopelessness (melancholic depression).  Trouble speaking and moving (catatonic depression).  How is this diagnosed? This condition may be diagnosed based on:  Your symptoms.  Your medical history, including your mental health history. This may involve tests to evaluate your mental health. You may be asked questions about your lifestyle, including any drug and alcohol use, and how long you have had symptoms of MDD.  A physical exam.  Blood tests to rule out other conditions.  You must have a depressed mood and at least four other MDD symptoms most of the day, nearly every day in the same 2-week timeframe before your health care provider can confirm a diagnosis of MDD. How is this treated? This condition is usually treated by mental health professionals, such as psychologists, psychiatrists, and clinical social workers. You may need more than one type of treatment. Treatment may include:  Psychotherapy. This is also called talk therapy or counseling. Types of psychotherapy include: ? Cognitive behavioral   therapy (CBT). This type of therapy teaches you to recognize unhealthy feelings, thoughts, and behaviors, and replace them with  positive thoughts and actions. ? Interpersonal therapy (IPT). This helps you to improve the way you relate to and communicate with others. ? Family therapy. This treatment includes members of your family.  Medicine to treat anxiety and depression, or to help you control certain emotions and behaviors.  Lifestyle changes, such as: ? Limiting alcohol and drug use. ? Exercising regularly. ? Getting plenty of sleep. ? Making healthy eating choices. ? Spending more time outdoors.  Treatments involving stimulation of the brain can be used in situations with extremely severe symptoms, or when medicine or other therapies do not work over time. These treatments include electroconvulsive therapy, transcranial magnetic stimulation, and vagal nerve stimulation. Follow these instructions at home: Activity  Return to your normal activities as told by your health care provider.  Exercise regularly and spend time outdoors as told by your health care provider. General instructions  Take over-the-counter and prescription medicines only as told by your health care provider.  Do not drink alcohol. If you drink alcohol, limit your alcohol intake to no more than 1 drink a day for nonpregnant women and 2 drinks a day for men. One drink equals 12 oz of beer, 5 oz of wine, or 1 oz of hard liquor. Alcohol can affect any antidepressant medicines you are taking. Talk to your health care provider about your alcohol use.  Eat a healthy diet and get plenty of sleep.  Find activities that you enjoy doing, and make time to do them.  Consider joining a support group. Your health care provider may be able to recommend a support group.  Keep all follow-up visits as told by your health care provider. This is important. Where to find more information: National Alliance on Mental Illness  www.nami.org  U.[place]. National Institute of Mental Health  www.nimh.nih.gov  National Suicide Prevention  Lifeline  1-800-273-TALK (8255). This is free, 24-hour help.  Contact a health care provider if:  Your symptoms get worse.  You develop new symptoms. Get help right away if:  You self-harm.  You have serious thoughts about hurting yourself or others.  You see, hear, taste, smell, or feel things that are not present (hallucinate). This information is not intended to replace advice given to you by your health care provider. Make sure you discuss any questions you have with your health care provider. Document Released: 09/19/2012 Document Revised: 01/30/2016 Document Reviewed: 12/04/2015 Elsevier Interactive Patient Education  2017 Elsevier Inc.  

## 2017-05-24 ENCOUNTER — Encounter: Payer: Self-pay | Admitting: Podiatry

## 2017-05-25 DIAGNOSIS — M79676 Pain in unspecified toe(s): Secondary | ICD-10-CM

## 2017-05-26 ENCOUNTER — Encounter: Payer: Self-pay | Admitting: Podiatry

## 2017-05-27 ENCOUNTER — Telehealth: Payer: Self-pay | Admitting: *Deleted

## 2017-05-27 NOTE — Telephone Encounter (Signed)
Received [facility] records [02/25/17] from [facility]; forwarded to provider for review with attached result/SLS 12/20

## 2017-06-13 ENCOUNTER — Other Ambulatory Visit: Payer: Self-pay | Admitting: Family Medicine

## 2017-06-13 DIAGNOSIS — F320 Major depressive disorder, single episode, mild: Secondary | ICD-10-CM

## 2017-06-16 ENCOUNTER — Encounter: Payer: Self-pay | Admitting: Podiatry

## 2017-06-16 DIAGNOSIS — M2012 Hallux valgus (acquired), left foot: Secondary | ICD-10-CM | POA: Diagnosis not present

## 2017-06-16 DIAGNOSIS — M21612 Bunion of left foot: Secondary | ICD-10-CM | POA: Diagnosis not present

## 2017-06-16 DIAGNOSIS — E7800 Pure hypercholesterolemia, unspecified: Secondary | ICD-10-CM | POA: Diagnosis not present

## 2017-06-16 DIAGNOSIS — M25572 Pain in left ankle and joints of left foot: Secondary | ICD-10-CM | POA: Diagnosis not present

## 2017-06-16 NOTE — Progress Notes (Signed)
Pre-operative Note  Patient presents to the [facility] today for surgical intervention of the LEFT foot for bunion correction. The surgical consent was reviewed with the patient and we discussed the procedure as well as the postoperative course. I again discussed all alternatives, risks, complications. I answered all of their questions to the best of my ability and they wish to proceed with surgery. No promises or guarantees were given as to the outcome of the surgery.   The surgical consent was signed.   Patient is NPO since midnight.  The patient does not have have a history of blood clots or bleeding disorders. No claudication symptoms.   Her sister is in the building but not with her when I spoke to her. Gave permission for me to speak with her after.   No further questions.   Ovid Curd, DPM [facility]

## 2017-06-17 ENCOUNTER — Telehealth: Payer: Self-pay | Admitting: *Deleted

## 2017-06-17 NOTE — Telephone Encounter (Signed)
Called and spoke with the patient today and patient states that she is doing good and did have some nausea this morning and did take the pain medicine with some oatmeal and overall is doing fine and will ice the area and I stated that the boot can come off while icing the area and elevate but put the boot back on when getting up and to call the office if any concerns or questions and we would see the patient on 06/21/17 at 3:15 pm. Misty Stanley

## 2017-06-21 ENCOUNTER — Ambulatory Visit (INDEPENDENT_AMBULATORY_CARE_PROVIDER_SITE_OTHER): Payer: Federal, State, Local not specified - PPO

## 2017-06-21 ENCOUNTER — Ambulatory Visit (INDEPENDENT_AMBULATORY_CARE_PROVIDER_SITE_OTHER): Payer: Federal, State, Local not specified - PPO | Admitting: Podiatry

## 2017-06-21 ENCOUNTER — Encounter: Payer: Self-pay | Admitting: Podiatry

## 2017-06-21 DIAGNOSIS — M2012 Hallux valgus (acquired), left foot: Secondary | ICD-10-CM

## 2017-06-21 DIAGNOSIS — Z9889 Other specified postprocedural states: Secondary | ICD-10-CM

## 2017-06-21 DIAGNOSIS — M21619 Bunion of unspecified foot: Secondary | ICD-10-CM

## 2017-06-21 MED ORDER — IBUPROFEN 600 MG PO TABS: 600.0000 mg | ORAL_TABLET | Freq: Three times a day (TID) | ORAL | 1 refills | Status: DC

## 2017-06-22 ENCOUNTER — Telehealth: Payer: Self-pay | Admitting: Podiatry

## 2017-06-22 NOTE — Progress Notes (Signed)
Subjective: Carrie [name] is a 57 y.o. is seen today in office s/p LEFT Austin bunionectomy preformed on 06/16/2017. They state their pain is been controlled.  She did take 1 pain pill today but she did not take any yesterday and she has been trying not to take it.  She has been wearing the cam boot and she is been tracking the foot ice and elevate as much as possible.  Denies any systemic complaints such as fevers, chills, nausea, vomiting. No calf pain, chest pain, shortness of breath.   Objective: General: No acute distress, AAOx3  DP/PT pulses palpable 2/4, CRT < 3 sec to all digits.  Protective sensation intact. Motor function intact.  LEFT foot: Incision is well coapted without any evidence of dehiscence and sutures are intact. There is no surrounding erythema, ascending cellulitis, fluctuance, crepitus, malodor, drainage/purulence. There is mild to moderate edema around the surgical site. There is minimal pain along the surgical site.  No evidence of any hematoma or fluid collection.   No other areas of tenderness to bilateral lower extremities.  No other open lesions or pre-ulcerative lesions.  No pain with calf compression, swelling, warmth, erythema.   Assessment and Plan:  Status post left foot bunion correction, doing well with no complications   -Treatment options discussed including all alternatives, risks, and complications -X-rays were obtained and reviewed.  Since post first metatarsal osteotomy hardware fixation.  No evidence of acute fracture identified otherwise.  I did show her the x-rays today and compared to preoperative x-rays. -Antibiotic ointment was applied to the incision followed by dry sterile dressing.  Keep the dressing clean, dry, intact -Continue cam boot.  Weightbearing as tolerated although limited. -Ice/elevation -Pain medication as needed. -Monitor for any clinical signs or symptoms of infection and DVT/PE and directed to call the office immediately should  any occur or go to the ER. -Follow-up as scheduled or sooner if any problems arise. In the meantime, encouraged to call the office with any questions, concerns, change in symptoms.  She states that she is very pleased with the look of the outcome of the surgery so far.  She has no further questions or concerns today.  Ovid Curd, DPM

## 2017-06-22 NOTE — Telephone Encounter (Signed)
I saw Dr. Ardelle Anton yesterday for my first post-op appointment and he stated he was going to call in a prescription to [facility]. I was calling to make sure the prescription was called in. My number is [phone]. Thank you.

## 2017-06-22 NOTE — Telephone Encounter (Signed)
Dr. Ardelle Anton called ibuprofen 600mg  to the [facility] yesterday at 3:48pm. Unable to leave a message the mailbox was full.

## 2017-06-23 NOTE — Telephone Encounter (Addendum)
Unable to leave a message on Mobile phone, the mailbox is full and can not accept any messages at this time. Left message informing pt the medication had been called in to [facility] 06/21/2017 at 3:48pm.

## 2017-06-28 ENCOUNTER — Ambulatory Visit (INDEPENDENT_AMBULATORY_CARE_PROVIDER_SITE_OTHER): Payer: Federal, State, Local not specified - PPO | Admitting: Podiatry

## 2017-06-28 ENCOUNTER — Encounter: Payer: Self-pay | Admitting: Podiatry

## 2017-06-28 VITALS — Temp 97.2°F

## 2017-06-28 DIAGNOSIS — Z9889 Other specified postprocedural states: Secondary | ICD-10-CM

## 2017-06-28 DIAGNOSIS — M21619 Bunion of unspecified foot: Secondary | ICD-10-CM

## 2017-06-30 NOTE — Progress Notes (Signed)
Subjective: Carrie [name] is a 57 y.o. is seen today in office s/p LEFT Austin bunionectomy preformed on 06/16/2017.  She has been using the cam boot.  She said that she is doing very well she has no concerns or questions.  She is happy with the way the toe looks so far.  She presents today with her sister.  She is been trying ice and elevate as much as possible.  She has no other concerns today. Denies any systemic complaints such as fevers, chills, nausea, vomiting. No calf pain, chest pain, shortness of breath.   Objective: General: No acute distress, AAOx3  DP/PT pulses palpable 2/4, CRT < 3 sec to all digits.  Protective sensation intact. Motor function intact.  LEFT foot: Incision is well coapted without any evidence of dehiscence and sutures are intact.  There is no surrounding erythema or ascending cellulitis.  There is no fluctuation or crepitation.  There is no malodor.  No clinical signs of infection.  The incision appears to be healing well.  The toes and rectus position.  No pain or crepitation with MPJ range of motion.  No other areas of tenderness identified bilaterally. No other open lesions or pre-ulcerative lesions.  No pain with calf compression, swelling, warmth, erythema.   Assessment and Plan:  Status post left foot bunion correction, doing well with no complications   -Treatment options discussed including all alternatives, risks, and complications -Incision appears to be healing well.  Antibiotic ointment was applied followed by a bandage.  She can start to shower on Saturday but I want her to keep the area clean and dry afterwards.  Antibiotic ointment daily. -Continue cam boot. -Ice and elevation -Pain medication as needed. -Monitor for any clinical signs or symptoms of infection and DVT/PE and directed to call the office immediately should any occur or go to the ER. -Follow-up as scheduled or sooner if any problems arise. In the meantime, encouraged to call the office  with any questions, concerns, change in symptoms.    Ovid Curd, DPM

## 2017-07-13 ENCOUNTER — Ambulatory Visit (INDEPENDENT_AMBULATORY_CARE_PROVIDER_SITE_OTHER): Payer: Federal, State, Local not specified - PPO

## 2017-07-13 ENCOUNTER — Ambulatory Visit (INDEPENDENT_AMBULATORY_CARE_PROVIDER_SITE_OTHER): Payer: Federal, State, Local not specified - PPO | Admitting: Podiatry

## 2017-07-13 ENCOUNTER — Encounter: Payer: Self-pay | Admitting: Podiatry

## 2017-07-13 DIAGNOSIS — M21619 Bunion of unspecified foot: Secondary | ICD-10-CM

## 2017-07-13 DIAGNOSIS — Z9889 Other specified postprocedural states: Secondary | ICD-10-CM

## 2017-07-14 NOTE — Progress Notes (Signed)
Subjective: Carrie [name] is a 57 y.o. is seen today in office s/p LEFT Austin bunionectomy preformed on 06/16/2017.  She states that she is been doing well.  She is been using the cam boot she has no pain.  She denies any increase in swelling or redness to her feet.  She has been trying to ice and elevate as much as possible.  She presents today with her sister.  She has no other concerns today. Denies any systemic complaints such as fevers, chills, nausea, vomiting. No calf pain, chest pain, shortness of breath.   Objective: General: No acute distress, AAOx3  DP/PT pulses palpable 2/4, CRT < 3 sec to all digits.  Protective sensation intact. Motor function intact.  LEFT foot: Incision is well coapted without any evidence of dehiscence and suture ends are intact.  There is mild decreased range of motion of the first MTPJ but there is no pain with MPJ range of motion.  No significant discomfort of the surgical site.  Incision is healing well there is no drainage or pus or any dehiscence.  No clinical signs of infection noted today. Mild abductus of the hallux but overall the toe is in more rectus position.  No other open lesions or pre-ulcerative lesions.  No pain with calf compression, swelling, warmth, erythema.   Assessment and Plan:  Status post left foot bunion correction, doing well with no complications   -Treatment options discussed including all alternatives, risks, and complications -X-rays were obtained and reviewed.  Increased consolidation across the setting site.  Space maintained in adequate alignment. -Cut the suture ends today.  Antibiotic ointment was applied followed by a bandage.  She can get the incision wet again starting tomorrow but I want her to continue with dressing changes as well.  Incision appears to be healing well. -Dispensed surgical shoe that she can transition to. -Range of motion exercises with the first MPJ -Dispensed a Darco bunion splint to help hold the toe  rectus position during healing -Continue ice and elevation -I will see her back in 2 weeks.  Repeat x-rays next appointment likely start physical therapy.  She agrees this plan has no further questions or concerns.  Vivi Barrack DPM

## 2017-07-29 ENCOUNTER — Ambulatory Visit (INDEPENDENT_AMBULATORY_CARE_PROVIDER_SITE_OTHER): Payer: Federal, State, Local not specified - PPO | Admitting: Podiatry

## 2017-07-29 ENCOUNTER — Ambulatory Visit (INDEPENDENT_AMBULATORY_CARE_PROVIDER_SITE_OTHER): Payer: Federal, State, Local not specified - PPO

## 2017-07-29 DIAGNOSIS — M21619 Bunion of unspecified foot: Secondary | ICD-10-CM

## 2017-07-30 NOTE — Progress Notes (Signed)
Subjective: Carrie [name] is a 57 y.o. is seen today in office s/p LEFT Austin bunionectomy preformed on 06/16/2017.  She states that she try to wear the surgical shoe but it was uncomfortable and she would trip.  She is remained in the cam boot.  She states that she is doing well she is not taking any pain medication.  She has no concerns to her feet today.  She has no other concerns today no acute changes. She presents today with her sister.  She has no other concerns today. Denies any systemic complaints such as fevers, chills, nausea, vomiting. No calf pain, chest pain, shortness of breath.   Objective: General: No acute distress, AAOx3  DP/PT pulses palpable 2/4, CRT < 3 sec to all digits.  Protective sensation intact. Motor function intact.  LEFT foot: Incision is well coapted without any evidence of dehiscence and a scar is well formed.  Mild edema to the surgical site but this is decreased.  There is no erythema or increase in warmth.  Mild restriction first MTPJ range of motion however there is no pain or crepitation with MPJ range of motion.  There is no other areas of tenderness identified bilaterally.   Toes and rectus positionNo other open lesions or pre-ulcerative lesions.  No pain with calf compression, swelling, warmth, erythema.   Assessment and Plan:  Status post left foot bunion correction, doing well with no complications   -Treatment options discussed including all alternatives, risks, and complications -X-rays were obtained and reviewed.  Increased consolidation across the setting site.  Space maintained in adequate alignment. -At this point I want her to start physical therapy next week and a prescription was provided today for [facility].  She can start to transition to shoe as she starts with physical therapy.  I will have her continue ice and elevation.  Continue range of motion exercises of the first MPJ at home.  She has a Darco bunion splint that she can  continue with as well. -I will see her back in 3 weeks.  Repeat x-rays next appointment. She agrees this plan has no further questions or concerns.  Vivi Barrack DPM

## 2017-08-10 DIAGNOSIS — R269 Unspecified abnormalities of gait and mobility: Secondary | ICD-10-CM | POA: Diagnosis not present

## 2017-08-10 DIAGNOSIS — M25572 Pain in left ankle and joints of left foot: Secondary | ICD-10-CM | POA: Diagnosis not present

## 2017-08-10 DIAGNOSIS — M25675 Stiffness of left foot, not elsewhere classified: Secondary | ICD-10-CM | POA: Diagnosis not present

## 2017-08-10 DIAGNOSIS — M25475 Effusion, left foot: Secondary | ICD-10-CM | POA: Diagnosis not present

## 2017-08-17 DIAGNOSIS — M25572 Pain in left ankle and joints of left foot: Secondary | ICD-10-CM | POA: Diagnosis not present

## 2017-08-17 DIAGNOSIS — M25475 Effusion, left foot: Secondary | ICD-10-CM | POA: Diagnosis not present

## 2017-08-17 DIAGNOSIS — R269 Unspecified abnormalities of gait and mobility: Secondary | ICD-10-CM | POA: Diagnosis not present

## 2017-08-17 DIAGNOSIS — M25675 Stiffness of left foot, not elsewhere classified: Secondary | ICD-10-CM | POA: Diagnosis not present

## 2017-08-18 DIAGNOSIS — R269 Unspecified abnormalities of gait and mobility: Secondary | ICD-10-CM | POA: Diagnosis not present

## 2017-08-18 DIAGNOSIS — M25475 Effusion, left foot: Secondary | ICD-10-CM | POA: Diagnosis not present

## 2017-08-18 DIAGNOSIS — M25675 Stiffness of left foot, not elsewhere classified: Secondary | ICD-10-CM | POA: Diagnosis not present

## 2017-08-18 DIAGNOSIS — M25572 Pain in left ankle and joints of left foot: Secondary | ICD-10-CM | POA: Diagnosis not present

## 2017-08-19 ENCOUNTER — Ambulatory Visit (INDEPENDENT_AMBULATORY_CARE_PROVIDER_SITE_OTHER): Payer: Federal, State, Local not specified - PPO

## 2017-08-19 ENCOUNTER — Ambulatory Visit (INDEPENDENT_AMBULATORY_CARE_PROVIDER_SITE_OTHER): Payer: Federal, State, Local not specified - PPO | Admitting: Podiatry

## 2017-08-19 ENCOUNTER — Encounter: Payer: Self-pay | Admitting: Podiatry

## 2017-08-19 ENCOUNTER — Other Ambulatory Visit: Payer: Self-pay | Admitting: Podiatry

## 2017-08-19 ENCOUNTER — Encounter: Payer: Federal, State, Local not specified - PPO | Admitting: Podiatry

## 2017-08-19 DIAGNOSIS — M21619 Bunion of unspecified foot: Secondary | ICD-10-CM

## 2017-08-19 DIAGNOSIS — R269 Unspecified abnormalities of gait and mobility: Secondary | ICD-10-CM | POA: Diagnosis not present

## 2017-08-19 DIAGNOSIS — M25572 Pain in left ankle and joints of left foot: Secondary | ICD-10-CM | POA: Diagnosis not present

## 2017-08-19 DIAGNOSIS — M25675 Stiffness of left foot, not elsewhere classified: Secondary | ICD-10-CM | POA: Diagnosis not present

## 2017-08-19 DIAGNOSIS — M25475 Effusion, left foot: Secondary | ICD-10-CM | POA: Diagnosis not present

## 2017-08-19 DIAGNOSIS — Z9889 Other specified postprocedural states: Secondary | ICD-10-CM

## 2017-08-23 NOTE — Progress Notes (Signed)
Subjective: Carrie [name] is a 57 y.o. is seen today in office s/p LEFT Austin bunionectomy preformed on 06/16/2017.  She states that she is doing well and physical therapy has been helping.  She is been able to wear regular shoe.  She still gets some discomfort when she tries to bend her big toe but overall is improving.  He denies any recent injury or trauma since I last saw her and she denies any acute changes.  She has no new concerns today and she states that she is doing well. Denies any systemic complaints such as fevers, chills, nausea, vomiting. No calf pain, chest pain, shortness of breath.   Objective: General: No acute distress, AAOx3  DP/PT pulses palpable 2/4, CRT < 3 sec to all digits.  Protective sensation intact. Motor function intact.  LEFT foot: Incision is well coapted without any evidence of dehiscence and a scar is well formed.  Mild decrease edema on the surgical site.  There is no pain on the incision.  No restrictions MPJ range of motion however it appears to be somewhat improved compared to last appointment.  There is no surrounding erythema or ascending cellulitis.  There is no clinical signs of infection noted today. Toes and rectus position. No other open lesions or pre-ulcerative lesions.  No pain with calf compression, swelling, warmth, erythema.   Assessment and Plan:  Status post left foot bunion correction, doing well  -Treatment options discussed including all alternatives, risks, and complications -X-rays were obtained and reviewed.  Increased consolidation across the surgical site with hardware intact.  No evidence of acute fracture identified. -On her to continue with physical therapy and continue range of motion exercises.  Also showed her how to do MPJ range of motion exercises.  Compression as needed for swelling.  Continue with regular shoes and gradual increase in activity level.  I will see her back in 4 weeks or sooner if needed.  Call any questions or  concerns.  She states that she is happy with the outcome of the surgery.  Vivi Barrack DPM

## 2017-08-24 DIAGNOSIS — M25572 Pain in left ankle and joints of left foot: Secondary | ICD-10-CM | POA: Diagnosis not present

## 2017-08-24 DIAGNOSIS — M25475 Effusion, left foot: Secondary | ICD-10-CM | POA: Diagnosis not present

## 2017-08-24 DIAGNOSIS — R269 Unspecified abnormalities of gait and mobility: Secondary | ICD-10-CM | POA: Diagnosis not present

## 2017-08-24 DIAGNOSIS — M25675 Stiffness of left foot, not elsewhere classified: Secondary | ICD-10-CM | POA: Diagnosis not present

## 2017-08-25 DIAGNOSIS — M25475 Effusion, left foot: Secondary | ICD-10-CM | POA: Diagnosis not present

## 2017-08-25 DIAGNOSIS — M25572 Pain in left ankle and joints of left foot: Secondary | ICD-10-CM | POA: Diagnosis not present

## 2017-08-25 DIAGNOSIS — M25675 Stiffness of left foot, not elsewhere classified: Secondary | ICD-10-CM | POA: Diagnosis not present

## 2017-08-25 DIAGNOSIS — R269 Unspecified abnormalities of gait and mobility: Secondary | ICD-10-CM | POA: Diagnosis not present

## 2017-08-26 DIAGNOSIS — M25475 Effusion, left foot: Secondary | ICD-10-CM | POA: Diagnosis not present

## 2017-08-26 DIAGNOSIS — M25675 Stiffness of left foot, not elsewhere classified: Secondary | ICD-10-CM | POA: Diagnosis not present

## 2017-08-26 DIAGNOSIS — M25572 Pain in left ankle and joints of left foot: Secondary | ICD-10-CM | POA: Diagnosis not present

## 2017-08-26 DIAGNOSIS — R269 Unspecified abnormalities of gait and mobility: Secondary | ICD-10-CM | POA: Diagnosis not present

## 2017-08-31 ENCOUNTER — Other Ambulatory Visit: Payer: Self-pay | Admitting: Family Medicine

## 2017-08-31 DIAGNOSIS — M25675 Stiffness of left foot, not elsewhere classified: Secondary | ICD-10-CM | POA: Diagnosis not present

## 2017-08-31 DIAGNOSIS — I10 Essential (primary) hypertension: Secondary | ICD-10-CM

## 2017-08-31 DIAGNOSIS — M25572 Pain in left ankle and joints of left foot: Secondary | ICD-10-CM | POA: Diagnosis not present

## 2017-08-31 DIAGNOSIS — R269 Unspecified abnormalities of gait and mobility: Secondary | ICD-10-CM | POA: Diagnosis not present

## 2017-08-31 DIAGNOSIS — M25475 Effusion, left foot: Secondary | ICD-10-CM | POA: Diagnosis not present

## 2017-09-01 DIAGNOSIS — M25675 Stiffness of left foot, not elsewhere classified: Secondary | ICD-10-CM | POA: Diagnosis not present

## 2017-09-01 DIAGNOSIS — M25475 Effusion, left foot: Secondary | ICD-10-CM | POA: Diagnosis not present

## 2017-09-01 DIAGNOSIS — R269 Unspecified abnormalities of gait and mobility: Secondary | ICD-10-CM | POA: Diagnosis not present

## 2017-09-01 DIAGNOSIS — M25572 Pain in left ankle and joints of left foot: Secondary | ICD-10-CM | POA: Diagnosis not present

## 2017-09-02 DIAGNOSIS — R269 Unspecified abnormalities of gait and mobility: Secondary | ICD-10-CM | POA: Diagnosis not present

## 2017-09-02 DIAGNOSIS — M25475 Effusion, left foot: Secondary | ICD-10-CM | POA: Diagnosis not present

## 2017-09-02 DIAGNOSIS — M25572 Pain in left ankle and joints of left foot: Secondary | ICD-10-CM | POA: Diagnosis not present

## 2017-09-02 DIAGNOSIS — M25675 Stiffness of left foot, not elsewhere classified: Secondary | ICD-10-CM | POA: Diagnosis not present

## 2017-09-08 ENCOUNTER — Encounter: Payer: Self-pay | Admitting: *Deleted

## 2017-09-08 ENCOUNTER — Telehealth: Payer: Self-pay | Admitting: *Deleted

## 2017-09-08 NOTE — Telephone Encounter (Signed)
I spoke to Carrie [name] and she asked if she could get a note to return to work full duty. I reviewed the 08/19/2017 notes and do not see that DR. Ardelle Anton has released her to work. I informed Carrie [name] and asked if she would prefer to be evaluated at next appt for return to work duty and could write a note stating that she would be evaluated 09/21/2017 for possible return to work after her 09/21/2017 appt. Dr. Bary Castilla. I informed Carrie [name] and she states she will pick up on 09/09/2017.

## 2017-09-08 NOTE — Telephone Encounter (Signed)
Pt states she needs an appt to return to work 09/13/2017.

## 2017-09-21 ENCOUNTER — Encounter: Payer: Self-pay | Admitting: Podiatry

## 2017-09-21 ENCOUNTER — Ambulatory Visit (INDEPENDENT_AMBULATORY_CARE_PROVIDER_SITE_OTHER): Payer: Federal, State, Local not specified - PPO | Admitting: Podiatry

## 2017-09-21 ENCOUNTER — Ambulatory Visit (INDEPENDENT_AMBULATORY_CARE_PROVIDER_SITE_OTHER): Payer: Federal, State, Local not specified - PPO

## 2017-09-21 DIAGNOSIS — M21619 Bunion of unspecified foot: Secondary | ICD-10-CM

## 2017-09-21 DIAGNOSIS — Z9889 Other specified postprocedural states: Secondary | ICD-10-CM

## 2017-09-22 NOTE — Progress Notes (Signed)
Subjective: Carrie [name] is a 57 y.o. is seen today in office s/p LEFT Austin bunionectomy preformed on 06/16/2017.  She states that she is doing well.  She has some mild swelling.  She says because of swelling she has difficulty wearing shoes at times.  She has finished physical therapy.  She has very minimal pain.  She is very happy with the outcome of the surgery so far she reports Denies any systemic complaints such as fevers, chills, nausea, vomiting. No calf pain, chest pain, shortness of breath.   Objective: General: No acute distress, AAOx3  DP/PT pulses palpable 2/4, CRT < 3 sec to all digits.  Protective sensation intact. Motor function intact.  LEFT foot: Incision is well coapted without any evidence of dehiscence and a scar is well formed.  Mild swelling to the surgical site but there is no erythema or increase in warmth.  There is no open sores identified there is no clinical signs of infection present.  Mild decreased range of motion of first MTPJ there is no pain with MPJ range of motion.  No other area tenderness identified at this time.  Toes are in a rectus position. No other open lesions or pre-ulcerative lesions.  No pain with calf compression, swelling, warmth, erythema.   Assessment and Plan:  Status post left foot bunion correction, doing well  -Treatment options discussed including all alternatives, risks, and complications -X-rays were obtained and reviewed.  Increased consolidation across the surgical site with hardware intact.  No evidence of acute fracture identified.  Mild arthritic changes present with MPJ -She was wearing the compression stocking however he got damaged so she stopped wearing it.  I did an Radio broadcast assistant in the office around the foot to help with the swelling also dispensed a compression anklet for her to wear.  Continue range of motion exercises at home and gradually transition to regular shoe as tolerated. -She is can be going back to work on October 04, 2017 working half days.  After that she will work for 1 week of half days and going back full-time. -Follow-up as scheduled.  Call any questions or concerns.  Vivi Barrack DPM

## 2017-10-12 ENCOUNTER — Encounter: Payer: Self-pay | Admitting: Podiatry

## 2017-10-12 ENCOUNTER — Ambulatory Visit (INDEPENDENT_AMBULATORY_CARE_PROVIDER_SITE_OTHER): Payer: Federal, State, Local not specified - PPO | Admitting: Podiatry

## 2017-10-12 DIAGNOSIS — M21619 Bunion of unspecified foot: Secondary | ICD-10-CM

## 2017-10-12 DIAGNOSIS — Z9889 Other specified postprocedural states: Secondary | ICD-10-CM

## 2017-10-13 NOTE — Progress Notes (Signed)
Subjective: Carrie [name] is a 57 y.o. is seen today in office s/p LEFT [place] bunionectomy preformed on 06/16/2017.  She states that she is doing well.  She presents in a dressier type shoe.  She states that she work about 4 hours before she notices some tightness to the area.  She is continue with range of motion exercises at home.  She has been gradually increasing her activity level.  She denies any pain to the area but still has some swelling towards the end of the day.  Denies any redness or warmth.  She has no other concerns today she states that she is happy with the outcome of the surgery so far. Denies any systemic complaints such as fevers, chills, nausea, vomiting. No calf pain, chest pain, shortness of breath.   Objective: General: No acute distress, AAOx3  DP/PT pulses palpable 2/4, CRT < 3 sec to all digits.  Protective sensation intact. Motor function intact.  LEFT foot: Incision is well coapted without any evidence of dehiscence and a scar is well formed.  Minimal swelling to the area and there is no erythema or increase in warmth.  There is still mild restriction of the MPJ range of motion however does appear to be improved compared to last appointment.  There is no tenderness to the surgical site and there is no other areas of tenderness identified to the feet today.  The toes and rectus position. No other open lesions or pre-ulcerative lesions.  No pain with calf compression, swelling, warmth, erythema.   Assessment and Plan:  Status post left foot bunion correction, doing well  -Treatment options discussed including all alternatives, risks, and complications -At this time she is doing well.  I want her to gradually increase her activity level and also continue with range of motion exercises of the first MPJ.  Discussed wearing a more supportive shoe.  She is wearing a dresser type shoe without much support and causing a lot of bending of the MPJ level.  I like to work on wearing a  stiffer, more supportive shoe at first before going to address shoe.  Also continue to ice and elevate.  Vivi Barrack DPM

## 2017-11-09 ENCOUNTER — Encounter: Payer: Federal, State, Local not specified - PPO | Admitting: Podiatry

## 2017-11-16 ENCOUNTER — Ambulatory Visit: Payer: Federal, State, Local not specified - PPO | Admitting: Family Medicine

## 2017-11-16 ENCOUNTER — Encounter: Payer: Self-pay | Admitting: Family Medicine

## 2017-11-16 VITALS — BP 115/84 | HR 108 | Temp 97.8°F | Resp 18 | Ht 64.17 in | Wt 186.2 lb

## 2017-11-16 DIAGNOSIS — E785 Hyperlipidemia, unspecified: Secondary | ICD-10-CM | POA: Diagnosis not present

## 2017-11-16 DIAGNOSIS — F320 Major depressive disorder, single episode, mild: Secondary | ICD-10-CM | POA: Diagnosis not present

## 2017-11-16 DIAGNOSIS — I10 Essential (primary) hypertension: Secondary | ICD-10-CM

## 2017-11-16 DIAGNOSIS — G5601 Carpal tunnel syndrome, right upper limb: Secondary | ICD-10-CM

## 2017-11-16 DIAGNOSIS — L299 Pruritus, unspecified: Secondary | ICD-10-CM | POA: Diagnosis not present

## 2017-11-16 DIAGNOSIS — F322 Major depressive disorder, single episode, severe without psychotic features: Secondary | ICD-10-CM

## 2017-11-16 MED ORDER — SERTRALINE HCL 50 MG PO TABS: 50.0000 mg | ORAL_TABLET | Freq: Every day | ORAL | 3 refills | Status: DC

## 2017-11-16 MED ORDER — IBUPROFEN 600 MG PO TABS: 600.0000 mg | ORAL_TABLET | Freq: Three times a day (TID) | ORAL | 1 refills | Status: DC

## 2017-11-16 MED ORDER — LISINOPRIL 20 MG PO TABS: 20.0000 mg | ORAL_TABLET | Freq: Every day | ORAL | 1 refills | Status: DC

## 2017-11-16 MED ORDER — LEVOCETIRIZINE DIHYDROCHLORIDE 5 MG PO TABS: 5.0000 mg | ORAL_TABLET | Freq: Every evening | ORAL | 5 refills | Status: DC

## 2017-11-16 MED ORDER — ATORVASTATIN CALCIUM 40 MG PO TABS: 40.0000 mg | ORAL_TABLET | Freq: Every day | ORAL | 1 refills | Status: DC

## 2017-11-16 MED ORDER — AMLODIPINE BESYLATE 5 MG PO TABS: 5.0000 mg | ORAL_TABLET | Freq: Every day | ORAL | 1 refills | Status: DC

## 2017-11-16 MED ORDER — BUPROPION HCL ER (XL) 300 MG PO TB24: ORAL_TABLET | ORAL | 1 refills | Status: DC

## 2017-11-16 NOTE — Patient Instructions (Signed)
Persistent Depressive Disorder, Adult Persistent depressive disorder (PDD) is a mental health condition that causes symptoms of low-level depression for 2 years or longer. It may also be called long-term (chronic) depression or dysthymia. PDD may include episodes of more severe depression that last for about 2 weeks (major depressive disorder or MDD). PDD can affect the way you think, feel, and sleep. This condition may also affect your relationships. You may be more likely to get sick if you have PDD. What are the causes? The exact cause of this condition is not known. PDD is most likely caused by a combination of things, which may include:  Genetic factors. These are traits that are passed along from parent to child.  Individual factors. Your personality, your behavior, and the way you handle your thoughts and feelings may contribute to PDD. This includes personality traits and behaviors learned from others.  Physical factors, such as: ? Differences in the part of your brain that controls emotion. This part of your brain may be different than it is in people who do not have PDD. ? Long-term (chronic) medical or psychiatric illnesses.  Social factors. Traumatic experiences or major life changes may play a role in the development of PDD.  What increases the risk? This condition is more likely to develop in women. The following factors may make you more likely to develop PDD:  A family history of depression.  Abnormally low levels of certain brain chemicals.  Traumatic events in childhood, especially abuse or the loss of a parent.  Being under a lot of stress, or long-term stress, especially from upsetting life experiences or losses.  A history of: ? Chronic physical illness. ? Other mental health disorders. ? Substance abuse.  Poor living conditions.  Experiencing social exclusion or discrimination on a regular basis.  What are the signs or symptoms? Symptoms of this condition  occur for most of the day, and may include:  Fatigue or low energy.  Eating too much or too little.  Sleeping too much or too little.  Restlessness or agitation.  Feelings of hopelessness.  Feeling worthless or guilty.  Anxiety.  Poor concentration or difficulty making decisions.  Low self-esteem.  Negative outlook.  Inability to have fun or experience pleasure.  Social withdrawal.  Unexplained physical complaints.  Irritability.  Aggressive behavior or anger.  How is this diagnosed? This condition may be diagnosed based on:  Your symptoms.  Your medical history, including your mental health history. This may involve tests to evaluate your mental health. You may be asked questions about your lifestyle, including any drug and alcohol use, and how long you have had symptoms of PDD.  A physical exam.  Blood tests to rule out other conditions.  You may be diagnosed with PDD if you have had a depressed mood for 2 years or longer, as well as other symptoms of depression. How is this treated? This condition is usually treated by mental health professionals, such as psychologists, psychiatrists, and clinical social workers. You may need more than one type of treatment. Treatment may include:  Psychotherapy. This is also called talk therapy or counseling. Types of psychotherapy include: ? Cognitive behavioral therapy (CBT). This type of therapy teaches you to recognize unhealthy feelings, thoughts, and behaviors, and replace them with positive thoughts and actions. ? Interpersonal therapy (IPT). This helps you to improve the way you relate to and communicate with others. ? Family therapy. This treatment includes members of your family.  Medicine to treat anxiety and   depression, or to help you control certain emotions and behaviors.  Lifestyle changes, such as: ? Limiting alcohol and drug use. ? Exercising regularly. ? Getting plenty of sleep. ? Making healthy eating  choices. ? Spending more time outdoors.  Follow these instructions at home: Activity  Return to your normal activities as told by your health care provider.  Exercise regularly and spend time outdoors as told by your health care provider. General instructions  Take over-the-counter and prescription medicines only as told by your health care provider.  Do not drink alcohol. If you drink alcohol, limit your alcohol intake to no more than 1 drink a day for nonpregnant women and 2 drinks a day for men. One drink equals 12 oz of beer, 5 oz of wine, or 1 oz of hard liquor. Alcohol can affect any antidepressant medicines you are taking. Talk to your health care provider about your alcohol use.  Eat a healthy diet and get plenty of sleep.  Find activities that you enjoy doing, and make time to do them.  Consider joining a support group. Your health care provider may be able to recommend a support group.  Keep all follow-up visits as told by your health care provider. This is important. Where to find more information: National Alliance on Mental Illness  www.nami.org  [place] National Institute of Mental Health  www.nimh.nih.gov  National Suicide Prevention Lifeline  1-800-273-TALK (8255). This is free, 24-hour help.  Contact a health care provider if:  Your symptoms get worse.  You develop new symptoms.  You have trouble sleeping or doing your daily activities. Get help right away if:  You self-harm.  You have serious thoughts about hurting yourself or others.  You see, hear, taste, smell, or feel things that are not present (hallucinate). This information is not intended to replace advice given to you by your health care provider. Make sure you discuss any questions you have with your health care provider. Document Released: 05/11/2012 Document Revised: 01/23/2016 Document Reviewed: 12/07/2015 Elsevier Interactive Patient Education  2018 Elsevier Inc.  

## 2017-11-16 NOTE — Assessment & Plan Note (Signed)
con't wellbutrin and add zoloft 50 mg  1/2 po qd x 8 days then inc to 1 po qd rto 1 month or sooner prn

## 2017-11-16 NOTE — Assessment & Plan Note (Signed)
Well controlled, no changes to meds. Encouraged heart healthy diet such as the DASH diet and exercise as tolerated.  °

## 2017-11-16 NOTE — Progress Notes (Addendum)
Subjective:  I acted as a Neurosurgeon for [employer]. Carrie [name], RMA   Patient ID: Carrie [name], female    DOB: 1961/01/20, 57 y.o.   MRN: [id number]  Chief Complaint  Patient presents with  . Medication Refill    HPI  Patient is in today for medication refills. She stopped taking lexapro after 2 days due to nausea.  She is feeling depressed and is willing to see a counselor She also c/o of numbness and tingling in her hand and is requesting a referral to a hand specialist.    Patient Care Team: Zola Button, Grayling Congress, DO as PCP - General Maxie Better, MD as Consulting Physician (Obstetrics and Gynecology)   Past Medical History:  Diagnosis Date  . Hyperlipidemia   . Hypertension     Past Surgical History:  Procedure Laterality Date  . ABDOMINAL HYSTERECTOMY      Family History  Problem Relation Age of Onset  . Breast cancer Unknown   . Coronary artery disease Unknown   . Diabetes Sister   . Hypertension Unknown     Social History   Socioeconomic History  . Marital status: Single    Spouse name: Not on file  . Number of children: Not on file  . Years of education: Not on file  . Highest education level: Not on file  Occupational History  . Not on file  Social Needs  . Financial resource strain: Not on file  . Food insecurity:    Worry: Not on file    Inability: Not on file  . Transportation needs:    Medical: Not on file    Non-medical: Not on file  Tobacco Use  . Smoking status: Former Smoker    Types: Cigarettes    Last attempt to quit: 02/05/2014    Years since quitting: 3.7  . Smokeless tobacco: Never Used  . Tobacco comment: 1 cig every 4-5 days ---weaning off  Substance and Sexual Activity  . Alcohol use: Yes    Alcohol/week: 0.0 oz  . Drug use: No  . Sexual activity: Not Currently    Partners: Male  Lifestyle  . Physical activity:    Days per week: Not on file    Minutes per session: Not on file  . Stress: Not on file    Relationships  . Social connections:    Talks on phone: Not on file    Gets together: Not on file    Attends religious service: Not on file    Active member of club or organization: Not on file    Attends meetings of clubs or organizations: Not on file    Relationship status: Not on file  . Intimate partner violence:    Fear of current or ex partner: Not on file    Emotionally abused: Not on file    Physically abused: Not on file    Forced sexual activity: Not on file  Other Topics Concern  . Not on file  Social History Narrative   Exercise --rare    Outpatient Medications Prior to Visit  Medication Sig Dispense Refill  . amLODipine (NORVASC) 5 MG tablet Take 1 tablet (5 mg total) by mouth daily. 90 tablet 1  . atorvastatin (LIPITOR) 40 MG tablet Take 40 mg daily by mouth.     Marland Kitchen buPROPion (WELLBUTRIN XL) 300 MG 24 hr tablet TAKE 1 TABLET EACH DAY. 90 tablet 0  . ibuprofen (ADVIL,MOTRIN) 600 MG tablet Take 1 tablet (600 mg total) by mouth  3 (three) times daily. 30 tablet 1  . levocetirizine (XYZAL) 5 MG tablet Take 1 tablet (5 mg total) by mouth every evening. 30 tablet 5  . lisinopril (PRINIVIL,ZESTRIL) 20 MG tablet TAKE 1 TABLET ONCE DAILY. 90 tablet 0  . escitalopram (LEXAPRO) 10 MG tablet Take 1 tablet (10 mg total) daily by mouth. (Patient not taking: Reported on 11/16/2017) 30 tablet 2   No facility-administered medications prior to visit.     No Known Allergies  Review of Systems  Constitutional: Negative for chills, fever and malaise/fatigue.  HENT: Negative for congestion and hearing loss.   Eyes: Negative for blurred vision and discharge.  Respiratory: Negative for cough, sputum production and shortness of breath.   Cardiovascular: Negative for chest pain, palpitations and leg swelling.  Gastrointestinal: Negative for abdominal pain, blood in stool, constipation, diarrhea, heartburn, nausea and vomiting.  Genitourinary: Negative for dysuria, frequency, hematuria and  urgency.  Musculoskeletal: Negative for back pain, falls and myalgias.  Skin: Negative for rash.  Neurological: Positive for tingling. Negative for dizziness, sensory change, loss of consciousness, weakness and headaches.  Endo/Heme/Allergies: Negative for environmental allergies. Does not bruise/bleed easily.  Psychiatric/Behavioral: Positive for depression. Negative for suicidal ideas. The patient is not nervous/anxious and does not have insomnia.        Objective:    Physical Exam  Constitutional: She is oriented to person, place, and time. She appears well-developed and well-nourished.  HENT:  Head: Normocephalic and atraumatic.  Eyes: Conjunctivae and EOM are normal.  Neck: Normal range of motion. Neck supple. No JVD present. Carotid bruit is not present. No thyromegaly present.  Cardiovascular: Normal rate, regular rhythm and normal heart sounds. Exam reveals no gallop.  No murmur heard. Pulmonary/Chest: Effort normal and breath sounds normal. No respiratory distress. She has no wheezes. She has no rales. She exhibits no tenderness.  Musculoskeletal: She exhibits no edema or tenderness.  Neurological: She is alert and oriented to person, place, and time.  Numbness and tingling in R hand   Psychiatric: Her speech is normal and behavior is normal. Judgment and thought content normal. Cognition and memory are normal. She exhibits a depressed mood.  Nursing note and vitals reviewed.   BP 115/84 (BP Location: Left Arm, Patient Position: Sitting, Cuff Size: Normal)   Pulse (!) 108   Temp 97.8 F (36.6 C) (Oral)   Resp 18   Ht 5' 4.17" (1.63 m)   Wt 186 lb 3.2 oz (84.5 kg)   SpO2 97%   BMI 31.79 kg/m  Wt Readings from Last 3 Encounters:  11/16/17 186 lb 3.2 oz (84.5 kg)  04/26/17 191 lb 3.2 oz (86.7 kg)  02/11/17 188 lb 12.8 oz (85.6 kg)   BP Readings from Last 3 Encounters:  11/16/17 115/84  04/26/17 128/86  04/19/17 (!) 134/96     Immunization History  Administered  Date(s) Administered  . PPD Test 01/24/2013  . Td 02/22/2002  . Tdap 01/24/2013    Health Maintenance  Topic Date Due  . HIV Screening  01/11/1974  . MAMMOGRAM  11/08/2014  . INFLUENZA VACCINE  01/06/2018  . PAP SMEAR  09/07/2018  . COLONOSCOPY  07/18/2019  . TETANUS/TDAP  01/25/2023  . Hepatitis C Screening  Completed    Lab Results  Component Value Date   WBC 7.0 02/11/2017   HGB 13.7 02/11/2017   HCT 41.1 02/11/2017   PLT 365.0 02/11/2017   GLUCOSE 101 (H) 02/11/2017   CHOL 196 02/11/2017   TRIG 95.0 02/11/2017  HDL 50.40 02/11/2017   LDLDIRECT 213.8 08/29/2012   LDLCALC 127 (H) 02/11/2017   ALT 23 02/11/2017   AST 22 02/11/2017   NA 137 02/11/2017   K 4.0 02/11/2017   CL 105 02/11/2017   CREATININE 1.15 02/11/2017   BUN 18 02/11/2017   CO2 26 02/11/2017   TSH 1.27 02/11/2017   HGBA1C 6.0 11/29/2014    Lab Results  Component Value Date   TSH 1.27 02/11/2017   Lab Results  Component Value Date   WBC 7.0 02/11/2017   HGB 13.7 02/11/2017   HCT 41.1 02/11/2017   MCV 92.8 02/11/2017   PLT 365.0 02/11/2017   Lab Results  Component Value Date   NA 137 02/11/2017   K 4.0 02/11/2017   CO2 26 02/11/2017   GLUCOSE 101 (H) 02/11/2017   BUN 18 02/11/2017   CREATININE 1.15 02/11/2017   BILITOT 0.9 02/11/2017   ALKPHOS 79 02/11/2017   AST 22 02/11/2017   ALT 23 02/11/2017   PROT 7.9 02/11/2017   ALBUMIN 4.6 02/11/2017   CALCIUM 10.1 02/11/2017   GFR 62.31 02/11/2017   Lab Results  Component Value Date   CHOL 196 02/11/2017   Lab Results  Component Value Date   HDL 50.40 02/11/2017   Lab Results  Component Value Date   LDLCALC 127 (H) 02/11/2017   Lab Results  Component Value Date   TRIG 95.0 02/11/2017   Lab Results  Component Value Date   CHOLHDL 4 02/11/2017   Lab Results  Component Value Date   HGBA1C 6.0 11/29/2014         Assessment & Plan:   Problem List Items Addressed This Visit      Unprioritized   Depression, major,  single episode, severe (HCC)    con't wellbutrin and add zoloft 50 mg  1/2 po qd x 8 days then inc to 1 po qd rto 1 month or sooner prn       Relevant Medications   buPROPion (WELLBUTRIN XL) 300 MG 24 hr tablet   sertraline (ZOLOFT) 50 MG tablet   Essential hypertension    Well controlled, no changes to meds. Encouraged heart healthy diet such as the DASH diet and exercise as tolerated.       Relevant Medications   lisinopril (PRINIVIL,ZESTRIL) 20 MG tablet   amLODipine (NORVASC) 5 MG tablet   atorvastatin (LIPITOR) 40 MG tablet   Other Relevant Orders   Lipid panel   Comprehensive metabolic panel   Hyperlipidemia LDL goal <100    Tolerating statin, encouraged heart healthy diet, avoid trans fats, minimize simple carbs and saturated fats. Increase exercise as tolerated      Relevant Medications   lisinopril (PRINIVIL,ZESTRIL) 20 MG tablet   amLODipine (NORVASC) 5 MG tablet   atorvastatin (LIPITOR) 40 MG tablet   Other Relevant Orders   Lipid panel   Comprehensive metabolic panel   Pruritus   Relevant Medications   levocetirizine (XYZAL) 5 MG tablet    Other Visit Diagnoses    Carpal tunnel syndrome of right wrist    -  Primary   Relevant Medications   buPROPion (WELLBUTRIN XL) 300 MG 24 hr tablet   sertraline (ZOLOFT) 50 MG tablet   Other Relevant Orders   Ambulatory referral to Orthopedic Surgery   Depression, major, single episode, mild (HCC)       Relevant Medications   buPROPion (WELLBUTRIN XL) 300 MG 24 hr tablet   sertraline (ZOLOFT) 50 MG tablet  I have discontinued Carrie [name]'s escitalopram. I have also changed her lisinopril and atorvastatin. Additionally, I am having her start on sertraline. Lastly, I am having her maintain her buPROPion, amLODipine, ibuprofen, and levocetirizine.  Meds ordered this encounter  Medications  . buPROPion (WELLBUTRIN XL) 300 MG 24 hr tablet    Sig: TAKE 1 TABLET EACH DAY.    Dispense:  90 tablet    Refill:  1  .  lisinopril (PRINIVIL,ZESTRIL) 20 MG tablet    Sig: Take 1 tablet (20 mg total) by mouth daily.    Dispense:  90 tablet    Refill:  1  . amLODipine (NORVASC) 5 MG tablet    Sig: Take 1 tablet (5 mg total) by mouth daily.    Dispense:  90 tablet    Refill:  1  . ibuprofen (ADVIL,MOTRIN) 600 MG tablet    Sig: Take 1 tablet (600 mg total) by mouth 3 (three) times daily.    Dispense:  30 tablet    Refill:  1  . levocetirizine (XYZAL) 5 MG tablet    Sig: Take 1 tablet (5 mg total) by mouth every evening.    Dispense:  30 tablet    Refill:  5  . atorvastatin (LIPITOR) 40 MG tablet    Sig: Take 1 tablet (40 mg total) by mouth daily.    Dispense:  90 tablet    Refill:  1  . sertraline (ZOLOFT) 50 MG tablet    Sig: Take 1 tablet (50 mg total) by mouth daily.    Dispense:  30 tablet    Refill:  3    CMA served as scribe during this visit. History, Physical and Plan performed by medical provider. Documentation and orders reviewed and attested to.  Donato Schultz, DO

## 2017-11-16 NOTE — Assessment & Plan Note (Signed)
Tolerating statin, encouraged heart healthy diet, avoid trans fats, minimize simple carbs and saturated fats. Increase exercise as tolerated 

## 2018-02-23 DIAGNOSIS — R350 Frequency of micturition: Secondary | ICD-10-CM | POA: Diagnosis not present

## 2018-02-23 DIAGNOSIS — Z6831 Body mass index (BMI) 31.0-31.9, adult: Secondary | ICD-10-CM | POA: Diagnosis not present

## 2018-02-23 DIAGNOSIS — Z01419 Encounter for gynecological examination (general) (routine) without abnormal findings: Secondary | ICD-10-CM | POA: Diagnosis not present

## 2018-02-23 DIAGNOSIS — Z1231 Encounter for screening mammogram for malignant neoplasm of breast: Secondary | ICD-10-CM | POA: Diagnosis not present

## 2018-03-09 DIAGNOSIS — K080 Exfoliation of teeth due to systemic causes: Secondary | ICD-10-CM | POA: Diagnosis not present

## 2018-04-08 DIAGNOSIS — H2513 Age-related nuclear cataract, bilateral: Secondary | ICD-10-CM | POA: Diagnosis not present

## 2018-04-08 DIAGNOSIS — H2511 Age-related nuclear cataract, right eye: Secondary | ICD-10-CM | POA: Diagnosis not present

## 2018-04-28 DIAGNOSIS — H2511 Age-related nuclear cataract, right eye: Secondary | ICD-10-CM | POA: Diagnosis not present

## 2018-04-29 DIAGNOSIS — H2512 Age-related nuclear cataract, left eye: Secondary | ICD-10-CM | POA: Diagnosis not present

## 2018-05-19 DIAGNOSIS — H2512 Age-related nuclear cataract, left eye: Secondary | ICD-10-CM | POA: Diagnosis not present

## 2018-08-13 ENCOUNTER — Other Ambulatory Visit: Payer: Self-pay | Admitting: Family Medicine

## 2018-08-13 DIAGNOSIS — F320 Major depressive disorder, single episode, mild: Secondary | ICD-10-CM

## 2018-08-25 ENCOUNTER — Ambulatory Visit (INDEPENDENT_AMBULATORY_CARE_PROVIDER_SITE_OTHER): Payer: Federal, State, Local not specified - PPO | Admitting: Family Medicine

## 2018-08-25 ENCOUNTER — Other Ambulatory Visit: Payer: Self-pay

## 2018-08-25 ENCOUNTER — Encounter: Payer: Self-pay | Admitting: Family Medicine

## 2018-08-25 VITALS — BP 128/86 | HR 78 | Temp 98.0°F | Resp 12 | Ht 64.0 in | Wt 186.4 lb

## 2018-08-25 DIAGNOSIS — E785 Hyperlipidemia, unspecified: Secondary | ICD-10-CM | POA: Diagnosis not present

## 2018-08-25 DIAGNOSIS — F320 Major depressive disorder, single episode, mild: Secondary | ICD-10-CM

## 2018-08-25 DIAGNOSIS — F321 Major depressive disorder, single episode, moderate: Secondary | ICD-10-CM

## 2018-08-25 DIAGNOSIS — I10 Essential (primary) hypertension: Secondary | ICD-10-CM | POA: Diagnosis not present

## 2018-08-25 LAB — LIPID PANEL
Cholesterol: 179 mg/dL (ref 0–200)
HDL: 59.7 mg/dL (ref >39.00)
LDL Cholesterol: 103 mg/dL — ABNORMAL HIGH (ref 0–99)
NonHDL: 119.7
Total CHOL/HDL Ratio: 3
Triglycerides: 84 mg/dL (ref 0.0–149.0)
VLDL: 16.8 mg/dL (ref 0.0–40.0)

## 2018-08-25 LAB — COMPREHENSIVE METABOLIC PANEL
ALT: 23 U/L (ref 0–35)
AST: 18 U/L (ref 0–37)
Albumin: 4.6 g/dL (ref 3.5–5.2)
Alkaline Phosphatase: 95 U/L (ref 39–117)
BUN: 21 mg/dL (ref 6–23)
CO2: 26 mEq/L (ref 19–32)
Calcium: 10.3 mg/dL (ref 8.4–10.5)
Chloride: 104 mEq/L (ref 96–112)
Creatinine, Ser: 1.14 mg/dL (ref 0.40–1.20)
GFR: 58.91 mL/min — ABNORMAL LOW (ref >60.00)
Glucose, Bld: 97 mg/dL (ref 70–99)
Potassium: 4.2 mEq/L (ref 3.5–5.1)
Sodium: 137 mEq/L (ref 135–145)
Total Bilirubin: 1 mg/dL (ref 0.2–1.2)
Total Protein: 7.4 g/dL (ref 6.0–8.3)

## 2018-08-25 LAB — CBC WITH DIFFERENTIAL/PLATELET
Basophils Absolute: 0.1 10*3/uL (ref 0.0–0.1)
Basophils Relative: 1 % (ref 0.0–3.0)
EOS PCT: 1.8 % (ref 0.0–5.0)
Eosinophils Absolute: 0.1 10*3/uL (ref 0.0–0.7)
HCT: 40.7 % (ref 36.0–46.0)
HEMOGLOBIN: 13.9 g/dL (ref 12.0–15.0)
Lymphocytes Relative: 28 % (ref 12.0–46.0)
Lymphs Abs: 1.7 10*3/uL (ref 0.7–4.0)
MCHC: 34.2 g/dL (ref 30.0–36.0)
MCV: 91 fl (ref 78.0–100.0)
MONO ABS: 0.6 10*3/uL (ref 0.1–1.0)
Monocytes Relative: 8.9 % (ref 3.0–12.0)
Neutro Abs: 3.8 10*3/uL (ref 1.4–7.7)
Neutrophils Relative %: 60.3 % (ref 43.0–77.0)
Platelets: 365 10*3/uL (ref 150.0–400.0)
RBC: 4.47 Mil/uL (ref 3.87–5.11)
RDW: 14 % (ref 11.5–15.5)
WBC: 6.3 10*3/uL (ref 4.0–10.5)

## 2018-08-25 LAB — TSH: TSH: 1.2 u[IU]/mL (ref 0.35–4.50)

## 2018-08-25 MED ORDER — ESCITALOPRAM OXALATE 10 MG PO TABS: 10.0000 mg | ORAL_TABLET | Freq: Every day | ORAL | 0 refills | Status: DC

## 2018-08-25 NOTE — Assessment & Plan Note (Signed)
Well controlled, no changes to meds. Encouraged heart healthy diet such as the DASH diet and exercise as tolerated.  °

## 2018-08-25 NOTE — Patient Instructions (Signed)

## 2018-08-25 NOTE — Assessment & Plan Note (Signed)
Tolerating statin, encouraged heart healthy diet, avoid trans fats, minimize simple carbs and saturated fats. Increase exercise as tolerated 

## 2018-08-25 NOTE — Progress Notes (Signed)
Patient ID: Carrie [name], female    DOB: 1960-06-24  Age: 58 y.o. MRN: [id number]    Subjective:  Subjective  HPI Carrie [name] presents for f/u depression.  She is not sleeping and feels more depressed.  She is exhausted and is requesting labs be done.    Review of Systems  Constitutional: Negative for appetite change, diaphoresis, fatigue and unexpected weight change.  Eyes: Negative for pain, redness and visual disturbance.  Respiratory: Negative for cough, chest tightness, shortness of breath and wheezing.   Cardiovascular: Negative for chest pain, palpitations and leg swelling.  Endocrine: Negative for cold intolerance, heat intolerance, polydipsia, polyphagia and polyuria.  Genitourinary: Negative for difficulty urinating, dysuria and frequency.  Neurological: Negative for dizziness, light-headedness, numbness and headaches.  Psychiatric/Behavioral: Positive for dysphoric mood and sleep disturbance. Negative for self-injury and suicidal ideas. The patient is nervous/anxious.     History Past Medical History:  Diagnosis Date  . Hyperlipidemia   . Hypertension     She has a past surgical history that includes Abdominal hysterectomy.   Her family history includes Breast cancer in her unknown relative; Coronary artery disease in her unknown relative; Diabetes in her sister; Hypertension in her unknown relative.She reports that she quit smoking about 4 years ago. Her smoking use included cigarettes. She has never used smokeless tobacco. She reports current alcohol use. She reports that she does not use drugs.  Current Outpatient Medications on File Prior to Visit  Medication Sig Dispense Refill  . amLODipine (NORVASC) 5 MG tablet Take 1 tablet (5 mg total) by mouth daily. 90 tablet 1  . atorvastatin (LIPITOR) 40 MG tablet Take 1 tablet (40 mg total) by mouth daily. 90 tablet 1  . buPROPion (WELLBUTRIN XL) 300 MG 24 hr tablet Take 1 tablet (300 mg total) by mouth daily. Needs ov  before any more refills. 30 tablet 0  . ibuprofen (ADVIL,MOTRIN) 600 MG tablet Take 1 tablet (600 mg total) by mouth 3 (three) times daily. 30 tablet 1  . levocetirizine (XYZAL) 5 MG tablet Take 1 tablet (5 mg total) by mouth every evening. 30 tablet 5  . lisinopril (PRINIVIL,ZESTRIL) 20 MG tablet Take 1 tablet (20 mg total) by mouth daily. 90 tablet 1   No current facility-administered medications on file prior to visit.      Objective:  Objective  Physical Exam Vitals signs and nursing note reviewed.  Constitutional:      Appearance: She is well-developed.  HENT:     Head: Normocephalic and atraumatic.  Eyes:     Conjunctiva/sclera: Conjunctivae normal.  Neck:     Musculoskeletal: Normal range of motion and neck supple.     Thyroid: No thyromegaly.     Vascular: No carotid bruit or JVD.  Cardiovascular:     Rate and Rhythm: Normal rate and regular rhythm.     Heart sounds: Normal heart sounds. No murmur.  Pulmonary:     Effort: Pulmonary effort is normal. No respiratory distress.     Breath sounds: Normal breath sounds. No wheezing or rales.  Chest:     Chest wall: No tenderness.  Neurological:     Mental Status: She is alert and oriented to person, place, and time.  Psychiatric:        Mood and Affect: Mood is anxious and depressed. Affect is tearful. Affect is not inappropriate.        Speech: Speech normal.        Behavior: Behavior normal.  Thought Content: Thought content normal.        Cognition and Memory: Cognition normal.    BP 128/86 (BP Location: Right Arm, Cuff Size: Normal)   Pulse 78   Temp 98 F (36.7 C) (Oral)   Resp 12   Ht 5\' 4"  (1.626 m)   Wt 186 lb 6.4 oz (84.6 kg)   SpO2 96%   BMI 32.00 kg/m  Wt Readings from Last 3 Encounters:  08/25/18 186 lb 6.4 oz (84.6 kg)  11/16/17 186 lb 3.2 oz (84.5 kg)  04/26/17 191 lb 3.2 oz (86.7 kg)     Lab Results  Component Value Date   WBC 6.3 08/25/2018   HGB 13.9 08/25/2018   HCT 40.7  08/25/2018   PLT 365.0 08/25/2018   GLUCOSE 97 08/25/2018   CHOL 179 08/25/2018   TRIG 84.0 08/25/2018   HDL 59.70 08/25/2018   LDLDIRECT 213.8 08/29/2012   LDLCALC 103 (H) 08/25/2018   ALT 23 08/25/2018   AST 18 08/25/2018   NA 137 08/25/2018   K 4.2 08/25/2018   CL 104 08/25/2018   CREATININE 1.14 08/25/2018   BUN 21 08/25/2018   CO2 26 08/25/2018   TSH 1.20 08/25/2018   HGBA1C 6.0 11/29/2014    No results found.   Assessment & Plan:  Plan  I have discontinued Carrie [name]'s sertraline. I am also having her start on escitalopram. Additionally, I am having her maintain her lisinopril, amLODipine, ibuprofen, levocetirizine, atorvastatin, and buPROPion.  Meds ordered this encounter  Medications  . escitalopram (LEXAPRO) 10 MG tablet    Sig: Take 1 tablet (10 mg total) by mouth daily.    Dispense:  30 tablet    Refill:  0    Problem List Items Addressed This Visit      Unprioritized   Depression, major, single episode, moderate (HCC) - Primary    con't wellbutrin Add lexapro Refer to psychology rto 1 month      Relevant Medications   escitalopram (LEXAPRO) 10 MG tablet   Other Relevant Orders   Ambulatory referral to Psychology   Essential hypertension    Well controlled, no changes to meds. Encouraged heart healthy diet such as the DASH diet and exercise as tolerated.       Relevant Orders   Comprehensive metabolic panel (Completed)   CBC with Differential/Platelet (Completed)   Hyperlipidemia LDL goal <100    Tolerating statin, encouraged heart healthy diet, avoid trans fats, minimize simple carbs and saturated fats. Increase exercise as tolerated      Relevant Orders   Lipid panel (Completed)   CBC with Differential/Platelet (Completed)    Other Visit Diagnoses    Depression, major, single episode, mild (HCC)       Relevant Medications   escitalopram (LEXAPRO) 10 MG tablet   Other Relevant Orders   CBC with Differential/Platelet (Completed)    TSH (Completed)      Follow-up: Return in about 3 months (around 11/25/2018), or if symptoms worsen or fail to improve.  Donato Schultz, DO

## 2018-08-25 NOTE — Assessment & Plan Note (Signed)
con't wellbutrin Add lexapro Refer to psychology rto 1 month

## 2018-08-27 ENCOUNTER — Other Ambulatory Visit: Payer: Self-pay | Admitting: Family Medicine

## 2018-08-27 DIAGNOSIS — E785 Hyperlipidemia, unspecified: Secondary | ICD-10-CM

## 2018-08-27 DIAGNOSIS — I10 Essential (primary) hypertension: Secondary | ICD-10-CM

## 2018-08-30 ENCOUNTER — Other Ambulatory Visit: Payer: Self-pay | Admitting: Family Medicine

## 2018-08-30 DIAGNOSIS — I10 Essential (primary) hypertension: Secondary | ICD-10-CM

## 2018-09-05 ENCOUNTER — Encounter: Payer: Self-pay | Admitting: Family Medicine

## 2018-09-05 ENCOUNTER — Telehealth: Payer: Self-pay | Admitting: *Deleted

## 2018-09-05 NOTE — Telephone Encounter (Signed)
From: [name]  Sent: 09/05/2018 12:41 PM EDT  To: [facility] Clinical [facility]  Subject: Visit Follow-Up Question               My FMLV is not going to be ready until 5 days from today. Your office only faxed over the first page. Could you please provide me with another Medical statement stating that I was incapacitated from March 19-September 05, 2018 due to a Health condition. (under my care is not suffient enough ) My FMLV papers should be approved by Monday of next week.    Fax#[phone]    Thanks    [name]

## 2018-09-05 NOTE — Telephone Encounter (Signed)
Do you still have her paperwork? 

## 2018-09-07 ENCOUNTER — Telehealth: Payer: Self-pay | Admitting: *Deleted

## 2018-09-07 NOTE — Telephone Encounter (Signed)
Copied from CRM [id number]. Topic: General - Inquiry >> Sep 06, 2018 11:37 AM Maia Petties wrote: Reason for CRM: pt left VM requesting call back about medical documentation. No additional details on VM.

## 2018-09-08 ENCOUNTER — Encounter: Payer: Self-pay | Admitting: Family Medicine

## 2018-09-08 NOTE — Telephone Encounter (Signed)
Pt called and stated that page 4 is missing and would like to know if Dr Laury Axon could sign and send back to her.   Fax# [phone]

## 2018-09-08 NOTE — Telephone Encounter (Signed)
I have not had any paperwork for this patient per there is no documentation from me in chart; this appears to be a letter written by your provider, such as a work note. I will keep an eye out for any paperwork that may come in, but so far I have not been involved in this process/SLS 04/02

## 2018-09-08 NOTE — Telephone Encounter (Signed)
Note in my chart 

## 2018-09-08 NOTE — Telephone Encounter (Signed)
Note and fmla papers sent over.  mychart sent to patient.

## 2018-09-08 NOTE — Telephone Encounter (Signed)
Note and fmla papers sent over.  mychart sent to patient. 

## 2018-09-08 NOTE — Telephone Encounter (Signed)
Awaiting on page 4.

## 2018-09-09 NOTE — Telephone Encounter (Signed)
Left message on machine that papers were refaxed to her.

## 2018-09-09 NOTE — Telephone Encounter (Signed)
See phone message

## 2018-09-17 ENCOUNTER — Other Ambulatory Visit: Payer: Self-pay | Admitting: Family Medicine

## 2018-09-17 DIAGNOSIS — E785 Hyperlipidemia, unspecified: Secondary | ICD-10-CM

## 2018-09-21 ENCOUNTER — Other Ambulatory Visit: Payer: Self-pay | Admitting: Family Medicine

## 2018-09-21 DIAGNOSIS — F320 Major depressive disorder, single episode, mild: Secondary | ICD-10-CM

## 2018-09-21 MED ORDER — BUPROPION HCL ER (XL) 300 MG PO TB24: 300.0000 mg | ORAL_TABLET | Freq: Every day | ORAL | 1 refills | Status: DC

## 2018-09-23 ENCOUNTER — Ambulatory Visit: Payer: BLUE CROSS/BLUE SHIELD | Admitting: Psychology

## 2018-09-23 ENCOUNTER — Ambulatory Visit (INDEPENDENT_AMBULATORY_CARE_PROVIDER_SITE_OTHER): Payer: Federal, State, Local not specified - PPO | Admitting: Psychology

## 2018-09-23 DIAGNOSIS — F321 Major depressive disorder, single episode, moderate: Secondary | ICD-10-CM

## 2018-10-14 ENCOUNTER — Ambulatory Visit (INDEPENDENT_AMBULATORY_CARE_PROVIDER_SITE_OTHER): Payer: Federal, State, Local not specified - PPO | Admitting: Psychology

## 2018-10-14 DIAGNOSIS — F321 Major depressive disorder, single episode, moderate: Secondary | ICD-10-CM | POA: Diagnosis not present

## 2018-11-17 ENCOUNTER — Ambulatory Visit (INDEPENDENT_AMBULATORY_CARE_PROVIDER_SITE_OTHER): Payer: Federal, State, Local not specified - PPO | Admitting: Psychology

## 2018-11-17 DIAGNOSIS — F321 Major depressive disorder, single episode, moderate: Secondary | ICD-10-CM

## 2018-12-01 ENCOUNTER — Ambulatory Visit (INDEPENDENT_AMBULATORY_CARE_PROVIDER_SITE_OTHER): Payer: Federal, State, Local not specified - PPO | Admitting: Psychology

## 2018-12-01 DIAGNOSIS — F321 Major depressive disorder, single episode, moderate: Secondary | ICD-10-CM

## 2018-12-21 ENCOUNTER — Ambulatory Visit (INDEPENDENT_AMBULATORY_CARE_PROVIDER_SITE_OTHER): Payer: Federal, State, Local not specified - PPO | Admitting: Psychology

## 2018-12-21 DIAGNOSIS — F321 Major depressive disorder, single episode, moderate: Secondary | ICD-10-CM | POA: Diagnosis not present

## 2018-12-23 ENCOUNTER — Other Ambulatory Visit: Payer: Self-pay

## 2018-12-26 ENCOUNTER — Ambulatory Visit (INDEPENDENT_AMBULATORY_CARE_PROVIDER_SITE_OTHER): Payer: Federal, State, Local not specified - PPO | Admitting: Family Medicine

## 2018-12-26 ENCOUNTER — Other Ambulatory Visit: Payer: Self-pay

## 2018-12-26 ENCOUNTER — Encounter: Payer: Self-pay | Admitting: Family Medicine

## 2018-12-26 VITALS — Temp 97.0°F | Ht 64.0 in | Wt 180.0 lb

## 2018-12-26 DIAGNOSIS — R5383 Other fatigue: Secondary | ICD-10-CM

## 2018-12-26 DIAGNOSIS — R110 Nausea: Secondary | ICD-10-CM

## 2018-12-26 NOTE — Progress Notes (Signed)
Virtual Visit via Video Note  I connected with Carrie [name] on 12/26/18 at  3:15 PM EDT by a video enabled telemedicine application and verified that I am speaking with the correct person using two identifiers.  Location: Patient: home  Provider: office    I discussed the limitations of evaluation and management by telemedicine and the availability of in person appointments. The patient expressed understanding and agreed to proceed.  History of Present Illness: Pt is home c/o no energy.  + stomach problems ,  Diarrhea , nausea , no vomiting x several weeks.  She has had several people at work with covid 58.     she has also had headaches .   No congestion , no fever  Observations/Objective: Vitals:   12/26/18 1522  Temp: (!) 97 F (36.1 C)  no other vitals obtained Pt is in NAD    Assessment and Plan: 1. Fatigue, unspecified type With headache and nausea covid test ordered  Pt to quarantine at home Work note in epic  - Novel Coronavirus, NAA ([facility])  2. Nausea See above  - Novel Coronavirus, NAA ([facility])   Follow Up Instructions:    I discussed the assessment and treatment plan with the patient. The patient was provided an opportunity to ask questions and all were answered. The patient agreed with the plan and demonstrated an understanding of the instructions.   The patient was advised to call back or seek an in-person evaluation if the symptoms worsen or if the condition fails to improve as anticipated.  I provided 15 minutes of non-face-to-face time during this encounter.   Ann Held, DO

## 2018-12-27 ENCOUNTER — Other Ambulatory Visit: Payer: Self-pay

## 2018-12-27 DIAGNOSIS — Z20822 Contact with and (suspected) exposure to covid-19: Secondary | ICD-10-CM

## 2018-12-27 DIAGNOSIS — Z20828 Contact with and (suspected) exposure to other viral communicable diseases: Secondary | ICD-10-CM

## 2018-12-27 NOTE — Progress Notes (Signed)
covid

## 2018-12-29 LAB — NOVEL CORONAVIRUS, NAA: SARS-CoV-2, NAA: NOT DETECTED

## 2018-12-30 ENCOUNTER — Telehealth: Payer: Self-pay | Admitting: Family Medicine

## 2018-12-30 NOTE — Telephone Encounter (Signed)
Relation to pt: self Call back number: [phone]   Reason for call:  Patient states PCP advised patient recommended additional labs (chart doesn't reflect future orders). Patient would like to know when she can return back to work, please advise

## 2019-01-02 ENCOUNTER — Encounter: Payer: Self-pay | Admitting: Family Medicine

## 2019-01-02 NOTE — Telephone Encounter (Signed)
Please advise. You wrote [place] letter on 07/20.

## 2019-01-02 NOTE — Telephone Encounter (Signed)
Pt called and is requesting an update. Pt states she would like to stay out of work for 10 days due to her still being fatigued. Please advise.

## 2019-01-02 NOTE — Telephone Encounter (Signed)
Note in my chart----  Return 8/5

## 2019-01-04 ENCOUNTER — Other Ambulatory Visit: Payer: Self-pay | Admitting: Family Medicine

## 2019-01-04 DIAGNOSIS — R5383 Other fatigue: Secondary | ICD-10-CM

## 2019-01-06 ENCOUNTER — Other Ambulatory Visit: Payer: Self-pay

## 2019-01-10 ENCOUNTER — Other Ambulatory Visit (INDEPENDENT_AMBULATORY_CARE_PROVIDER_SITE_OTHER): Payer: Federal, State, Local not specified - PPO

## 2019-01-10 ENCOUNTER — Other Ambulatory Visit: Payer: Self-pay

## 2019-01-10 DIAGNOSIS — R5383 Other fatigue: Secondary | ICD-10-CM

## 2019-01-10 LAB — CBC WITH DIFFERENTIAL/PLATELET
Basophils Absolute: 0.1 10*3/uL (ref 0.0–0.1)
Basophils Relative: 1 % (ref 0.0–3.0)
Eosinophils Absolute: 0.1 10*3/uL (ref 0.0–0.7)
Eosinophils Relative: 1.8 % (ref 0.0–5.0)
HCT: 42.3 % (ref 36.0–46.0)
Hemoglobin: 14.1 g/dL (ref 12.0–15.0)
Lymphocytes Relative: 29.6 % (ref 12.0–46.0)
Lymphs Abs: 1.8 10*3/uL (ref 0.7–4.0)
MCHC: 33.3 g/dL (ref 30.0–36.0)
MCV: 91.6 fl (ref 78.0–100.0)
Monocytes Absolute: 0.5 10*3/uL (ref 0.1–1.0)
Monocytes Relative: 7.2 % (ref 3.0–12.0)
Neutro Abs: 3.8 10*3/uL (ref 1.4–7.7)
Neutrophils Relative %: 60.4 % (ref 43.0–77.0)
Platelets: 353 10*3/uL (ref 150.0–400.0)
RBC: 4.62 Mil/uL (ref 3.87–5.11)
RDW: 13.9 % (ref 11.5–15.5)
WBC: 6.2 10*3/uL (ref 4.0–10.5)

## 2019-01-10 LAB — COMPREHENSIVE METABOLIC PANEL
ALT: 18 U/L (ref 0–35)
AST: 15 U/L (ref 0–37)
Albumin: 4.6 g/dL (ref 3.5–5.2)
Alkaline Phosphatase: 92 U/L (ref 39–117)
BUN: 12 mg/dL (ref 6–23)
CO2: 25 mEq/L (ref 19–32)
Calcium: 10.3 mg/dL (ref 8.4–10.5)
Chloride: 106 mEq/L (ref 96–112)
Creatinine, Ser: 1.16 mg/dL (ref 0.40–1.20)
GFR: 57.66 mL/min — ABNORMAL LOW (ref >60.00)
Glucose, Bld: 105 mg/dL — ABNORMAL HIGH (ref 70–99)
Potassium: 4.2 mEq/L (ref 3.5–5.1)
Sodium: 138 mEq/L (ref 135–145)
Total Bilirubin: 0.9 mg/dL (ref 0.2–1.2)
Total Protein: 7.3 g/dL (ref 6.0–8.3)

## 2019-01-10 LAB — TSH: TSH: 1.23 u[IU]/mL (ref 0.35–4.50)

## 2019-01-10 LAB — VITAMIN B12: Vitamin B-12: 404 pg/mL (ref 211–911)

## 2019-01-17 ENCOUNTER — Ambulatory Visit: Payer: Federal, State, Local not specified - PPO | Admitting: Psychology

## 2019-01-18 ENCOUNTER — Other Ambulatory Visit: Payer: Self-pay | Admitting: Family Medicine

## 2019-01-18 DIAGNOSIS — L299 Pruritus, unspecified: Secondary | ICD-10-CM

## 2019-04-04 ENCOUNTER — Other Ambulatory Visit: Payer: Self-pay | Admitting: Family Medicine

## 2019-04-04 DIAGNOSIS — F320 Major depressive disorder, single episode, mild: Secondary | ICD-10-CM

## 2019-05-29 ENCOUNTER — Other Ambulatory Visit: Payer: Self-pay

## 2019-05-29 ENCOUNTER — Encounter: Payer: Self-pay | Admitting: Family Medicine

## 2019-05-29 ENCOUNTER — Ambulatory Visit (INDEPENDENT_AMBULATORY_CARE_PROVIDER_SITE_OTHER): Payer: Federal, State, Local not specified - PPO | Admitting: Family Medicine

## 2019-05-29 DIAGNOSIS — I10 Essential (primary) hypertension: Secondary | ICD-10-CM

## 2019-05-29 DIAGNOSIS — E785 Hyperlipidemia, unspecified: Secondary | ICD-10-CM

## 2019-05-29 DIAGNOSIS — L299 Pruritus, unspecified: Secondary | ICD-10-CM

## 2019-05-29 MED ORDER — LEVOCETIRIZINE DIHYDROCHLORIDE 5 MG PO TABS: ORAL_TABLET | ORAL | 0 refills | Status: DC

## 2019-05-29 MED ORDER — IBUPROFEN 600 MG PO TABS: 600.0000 mg | ORAL_TABLET | Freq: Three times a day (TID) | ORAL | 0 refills | Status: DC

## 2019-05-29 NOTE — Progress Notes (Signed)
Virtual Visit via Video Note  I connected with Carrie [name] on 05/29/19 at  4:00 PM EST by a video enabled telemedicine application and verified that I am speaking with the correct person using two identifiers.  Location: Patient: home alone  Provider: office    I discussed the limitations of evaluation and management by telemedicine and the availability of in person appointments. The patient expressed understanding and agreed to proceed.  History of Present Illness: Pt is home with no complaints She needs to have labs done and f/u bp and chol.   Observations/Objective: No vitals today Pt is in NAD  Assessment and Plan: 1. Pruritus Stable Refill meds  - levocetirizine (XYZAL) 5 MG tablet; TAKE 1 TABLET IN THE P.M.  Dispense: 90 tablet; Refill: 0  2. Hyperlipidemia, unspecified hyperlipidemia type Tolerating statin, encouraged heart healthy diet, avoid trans fats, minimize simple carbs and saturated fats. Increase exercise as tolerated - Lipid panel; Future - Comprehensive metabolic panel; Future . 3. Essential hypertension Well controlled, no changes to meds. Encouraged heart healthy diet such as the DASH diet and exercise as tolerated.  - Lipid panel; Future - Comprehensive metabolic panel; Future   Follow Up Instructions:    I discussed the assessment and treatment plan with the patient. The patient was provided an opportunity to ask questions and all were answered. The patient agreed with the plan and demonstrated an understanding of the in The patient was advised to call back or seek an in-person evaluation if the symptoms worsen or if the condition fails to improve as anticipated.  I provided 15 minutes of non-face-to-face time during this encounter.   Ann Held, DO

## 2019-06-13 ENCOUNTER — Other Ambulatory Visit: Payer: Self-pay | Admitting: Family Medicine

## 2019-06-13 ENCOUNTER — Ambulatory Visit: Payer: Federal, State, Local not specified - PPO | Attending: Internal Medicine

## 2019-06-13 DIAGNOSIS — Z20822 Contact with and (suspected) exposure to covid-19: Secondary | ICD-10-CM

## 2019-06-13 DIAGNOSIS — L299 Pruritus, unspecified: Secondary | ICD-10-CM

## 2019-06-13 MED ORDER — LEVOCETIRIZINE DIHYDROCHLORIDE 5 MG PO TABS: ORAL_TABLET | ORAL | 0 refills | Status: DC

## 2019-06-15 ENCOUNTER — Other Ambulatory Visit: Payer: Self-pay | Admitting: Family Medicine

## 2019-06-15 DIAGNOSIS — I10 Essential (primary) hypertension: Secondary | ICD-10-CM

## 2019-06-15 DIAGNOSIS — E785 Hyperlipidemia, unspecified: Secondary | ICD-10-CM

## 2019-06-15 LAB — NOVEL CORONAVIRUS, NAA: SARS-CoV-2, NAA: NOT DETECTED

## 2019-06-22 DIAGNOSIS — Z6832 Body mass index (BMI) 32.0-32.9, adult: Secondary | ICD-10-CM | POA: Diagnosis not present

## 2019-06-22 DIAGNOSIS — Z01419 Encounter for gynecological examination (general) (routine) without abnormal findings: Secondary | ICD-10-CM | POA: Diagnosis not present

## 2019-07-13 ENCOUNTER — Other Ambulatory Visit: Payer: Self-pay | Admitting: Family Medicine

## 2019-07-13 ENCOUNTER — Encounter: Payer: Self-pay | Admitting: Family Medicine

## 2019-07-13 DIAGNOSIS — E785 Hyperlipidemia, unspecified: Secondary | ICD-10-CM

## 2019-07-13 DIAGNOSIS — I10 Essential (primary) hypertension: Secondary | ICD-10-CM

## 2019-07-13 NOTE — Telephone Encounter (Signed)
Orders in 

## 2019-07-13 NOTE — Telephone Encounter (Signed)
Sent patient mychart message

## 2019-07-13 NOTE — Telephone Encounter (Signed)
Could you place orders if appropriate?

## 2019-07-25 DIAGNOSIS — Z1231 Encounter for screening mammogram for malignant neoplasm of breast: Secondary | ICD-10-CM | POA: Diagnosis not present

## 2019-07-25 LAB — HM MAMMOGRAPHY

## 2019-08-17 DIAGNOSIS — Z1211 Encounter for screening for malignant neoplasm of colon: Secondary | ICD-10-CM | POA: Diagnosis not present

## 2019-08-17 DIAGNOSIS — K5730 Diverticulosis of large intestine without perforation or abscess without bleeding: Secondary | ICD-10-CM | POA: Diagnosis not present

## 2019-09-22 ENCOUNTER — Other Ambulatory Visit: Payer: Federal, State, Local not specified - PPO

## 2019-09-29 ENCOUNTER — Other Ambulatory Visit: Payer: Self-pay

## 2019-09-29 ENCOUNTER — Other Ambulatory Visit (INDEPENDENT_AMBULATORY_CARE_PROVIDER_SITE_OTHER): Payer: Federal, State, Local not specified - PPO

## 2019-09-29 DIAGNOSIS — E785 Hyperlipidemia, unspecified: Secondary | ICD-10-CM | POA: Diagnosis not present

## 2019-09-29 DIAGNOSIS — I10 Essential (primary) hypertension: Secondary | ICD-10-CM

## 2019-09-29 NOTE — Addendum Note (Signed)
Addended by: Harley Alto on: 09/29/2019 03:12 PM   Modules accepted: Orders

## 2019-09-29 NOTE — Addendum Note (Signed)
Addended by: Sian Joles M on: 09/29/2019 03:12 PM   Modules accepted: Orders  

## 2019-09-29 NOTE — Addendum Note (Signed)
Addended by: Harley Alto on: 09/29/2019 03:13 PM   Modules accepted: Orders

## 2019-09-30 LAB — LIPID PANEL
Cholesterol: 191 mg/dL (ref <200)
HDL: 52 mg/dL (ref >=50)
LDL Cholesterol (Calc): 114 mg/dL (calc) — ABNORMAL HIGH
Non-HDL Cholesterol (Calc): 139 mg/dL (calc) — ABNORMAL HIGH (ref <130)
Total CHOL/HDL Ratio: 3.7 (calc) (ref <5.0)
Triglycerides: 131 mg/dL (ref <150)

## 2019-09-30 LAB — COMPREHENSIVE METABOLIC PANEL
AG Ratio: 1.7 (calc) (ref 1.0–2.5)
ALT: 21 U/L (ref 6–29)
AST: 18 U/L (ref 10–35)
Albumin: 4.3 g/dL (ref 3.6–5.1)
Alkaline phosphatase (APISO): 86 U/L (ref 37–153)
BUN/Creatinine Ratio: 15 (calc) (ref 6–22)
BUN: 18 mg/dL (ref 7–25)
CO2: 24 mmol/L (ref 20–32)
Calcium: 10.3 mg/dL (ref 8.6–10.4)
Chloride: 109 mmol/L (ref 98–110)
Creat: 1.22 mg/dL — ABNORMAL HIGH (ref 0.50–0.99)
Globulin: 2.5 g/dL (calc) (ref 1.9–3.7)
Glucose, Bld: 81 mg/dL (ref 65–99)
Potassium: 3.9 mmol/L (ref 3.5–5.3)
Sodium: 142 mmol/L (ref 135–146)
Total Bilirubin: 0.6 mg/dL (ref 0.2–1.2)
Total Protein: 6.8 g/dL (ref 6.1–8.1)

## 2019-10-01 ENCOUNTER — Other Ambulatory Visit: Payer: Self-pay | Admitting: Family Medicine

## 2019-10-01 DIAGNOSIS — E785 Hyperlipidemia, unspecified: Secondary | ICD-10-CM

## 2019-10-02 ENCOUNTER — Other Ambulatory Visit: Payer: Self-pay | Admitting: Family Medicine

## 2019-10-02 MED ORDER — ROSUVASTATIN CALCIUM 40 MG PO TABS: 40.0000 mg | ORAL_TABLET | Freq: Every day | ORAL | 2 refills | Status: DC

## 2019-10-31 ENCOUNTER — Other Ambulatory Visit: Payer: Self-pay | Admitting: Family Medicine

## 2019-10-31 DIAGNOSIS — F320 Major depressive disorder, single episode, mild: Secondary | ICD-10-CM

## 2019-11-16 DIAGNOSIS — M79642 Pain in left hand: Secondary | ICD-10-CM | POA: Diagnosis not present

## 2019-11-16 DIAGNOSIS — M79641 Pain in right hand: Secondary | ICD-10-CM | POA: Diagnosis not present

## 2019-11-17 ENCOUNTER — Other Ambulatory Visit: Payer: Self-pay | Admitting: Family Medicine

## 2019-12-13 DIAGNOSIS — M79642 Pain in left hand: Secondary | ICD-10-CM | POA: Diagnosis not present

## 2019-12-13 DIAGNOSIS — M79641 Pain in right hand: Secondary | ICD-10-CM | POA: Diagnosis not present

## 2019-12-25 ENCOUNTER — Ambulatory Visit: Payer: Federal, State, Local not specified - PPO | Admitting: Family Medicine

## 2019-12-25 ENCOUNTER — Other Ambulatory Visit: Payer: Self-pay

## 2019-12-25 ENCOUNTER — Encounter: Payer: Self-pay | Admitting: Family Medicine

## 2019-12-25 VITALS — BP 134/86 | HR 100 | Temp 98.0°F | Resp 18 | Ht 64.0 in | Wt 190.2 lb

## 2019-12-25 DIAGNOSIS — F418 Other specified anxiety disorders: Secondary | ICD-10-CM | POA: Diagnosis not present

## 2019-12-25 DIAGNOSIS — R519 Headache, unspecified: Secondary | ICD-10-CM | POA: Diagnosis not present

## 2019-12-25 DIAGNOSIS — N951 Menopausal and female climacteric states: Secondary | ICD-10-CM

## 2019-12-25 DIAGNOSIS — I10 Essential (primary) hypertension: Secondary | ICD-10-CM

## 2019-12-25 DIAGNOSIS — E785 Hyperlipidemia, unspecified: Secondary | ICD-10-CM

## 2019-12-25 DIAGNOSIS — F322 Major depressive disorder, single episode, severe without psychotic features: Secondary | ICD-10-CM

## 2019-12-25 DIAGNOSIS — F320 Major depressive disorder, single episode, mild: Secondary | ICD-10-CM | POA: Diagnosis not present

## 2019-12-25 LAB — COMPREHENSIVE METABOLIC PANEL
ALT: 24 U/L (ref 0–35)
AST: 19 U/L (ref 0–37)
Albumin: 4.5 g/dL (ref 3.5–5.2)
Alkaline Phosphatase: 85 U/L (ref 39–117)
BUN: 17 mg/dL (ref 6–23)
CO2: 26 mEq/L (ref 19–32)
Calcium: 10.3 mg/dL (ref 8.4–10.5)
Chloride: 109 mEq/L (ref 96–112)
Creatinine, Ser: 0.98 mg/dL (ref 0.40–1.20)
GFR: 69.82 mL/min (ref >60.00)
Glucose, Bld: 86 mg/dL (ref 70–99)
Potassium: 4.3 mEq/L (ref 3.5–5.1)
Sodium: 140 mEq/L (ref 135–145)
Total Bilirubin: 0.8 mg/dL (ref 0.2–1.2)
Total Protein: 7.2 g/dL (ref 6.0–8.3)

## 2019-12-25 LAB — LIPID PANEL
Cholesterol: 175 mg/dL (ref 0–200)
HDL: 60.8 mg/dL (ref >39.00)
LDL Cholesterol: 100 mg/dL — ABNORMAL HIGH (ref 0–99)
NonHDL: 114.34
Total CHOL/HDL Ratio: 3
Triglycerides: 72 mg/dL (ref 0.0–149.0)
VLDL: 14.4 mg/dL (ref 0.0–40.0)

## 2019-12-25 LAB — TSH: TSH: 1.53 u[IU]/mL (ref 0.35–4.50)

## 2019-12-25 LAB — SEDIMENTATION RATE: Sed Rate: 6 mm/hr (ref 0–30)

## 2019-12-25 MED ORDER — LISINOPRIL 30 MG PO TABS: 30.0000 mg | ORAL_TABLET | Freq: Every day | ORAL | 2 refills | Status: DC

## 2019-12-25 MED ORDER — BUPROPION HCL ER (XL) 300 MG PO TB24: ORAL_TABLET | ORAL | 3 refills | Status: DC

## 2019-12-25 MED ORDER — ESCITALOPRAM OXALATE 20 MG PO TABS: 20.0000 mg | ORAL_TABLET | Freq: Every day | ORAL | 2 refills | Status: DC

## 2019-12-25 NOTE — Progress Notes (Signed)
Patient ID: Carrie [name], female    DOB: [birth year]  Age: 59 y.o. MRN: [id number]    Subjective:  Subjective  HPI Carrie [name] presents for f/u depression / anxiety  Pt needs fmla paperwork filled out.    Her sone is in prison and may have prostate cancer.    She is under a lot of stress and gets angry easily   She is worried about her job.     Review of Systems  Constitutional: Positive for fatigue. Negative for appetite change, diaphoresis and unexpected weight change.  Eyes: Negative for pain, redness and visual disturbance.  Respiratory: Negative for cough, chest tightness, shortness of breath and wheezing.   Cardiovascular: Negative for chest pain, palpitations and leg swelling.  Endocrine: Negative for cold intolerance, heat intolerance, polydipsia, polyphagia and polyuria.  Genitourinary: Negative for difficulty urinating, dysuria and frequency.  Neurological: Negative for dizziness, light-headedness, numbness and headaches.  Psychiatric/Behavioral: Positive for decreased concentration, dysphoric mood and sleep disturbance. Negative for agitation, behavioral problems, confusion, hallucinations, self-injury and suicidal ideas. The patient is nervous/anxious. The patient is not hyperactive.     History Past Medical History:  Diagnosis Date  . Hyperlipidemia   . Hypertension     She has a past surgical history that includes Abdominal hysterectomy.   Her family history includes Breast cancer in her unknown relative; Coronary artery disease in her unknown relative; Diabetes in her sister; Hypertension in her unknown relative.She reports that she quit smoking about 5 years ago. Her smoking use included cigarettes. She has never used smokeless tobacco. She reports current alcohol use. She reports that she does not use drugs.  Current Outpatient Medications on File Prior to Visit  Medication Sig Dispense Refill  . amLODipine  (NORVASC) 5 MG tablet TAKE 1 TABLET ONCE DAILY. 90 tablet 2  . ibuprofen (ADVIL) 600 MG tablet TAKE 1 TABLET BY MOUTH 3 TIMES A DAY. 30 tablet 0  . levocetirizine (XYZAL) 5 MG tablet TAKE 1 TABLET IN THE P.M. 90 tablet 0  . rosuvastatin (CRESTOR) 40 MG tablet Take 1 tablet (40 mg total) by mouth daily. 30 tablet 2   No current facility-administered medications on file prior to visit.     Objective:  Objective  Physical Exam Vitals and nursing note reviewed.  Constitutional:      Appearance: She is well-developed.  HENT:     Head: Normocephalic and atraumatic.  Eyes:     Conjunctiva/sclera: Conjunctivae normal.  Neck:     Thyroid: No thyromegaly.     Vascular: No carotid bruit or JVD.  Cardiovascular:     Rate and Rhythm: Normal rate and regular rhythm.     Heart sounds: Normal heart sounds. No murmur heard.   Pulmonary:     Effort: Pulmonary effort is normal. No respiratory distress.     Breath sounds: Normal breath sounds. No wheezing or rales.  Chest:     Chest wall: No tenderness.  Musculoskeletal:     Cervical back: Normal range of motion and neck supple.  Neurological:     Mental Status: She is alert and oriented to person, place, and time.  Psychiatric:        Attention and Perception: Attention and perception normal.        Mood and Affect: Mood is anxious and depressed. Mood is not elated. Affect  is tearful. Affect is not labile, blunt, flat, angry or inappropriate.        Speech: Speech normal.        Behavior: Behavior normal.        Thought Content: Thought content normal.     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 and it may be prostate cancer      BP 134/86 (BP Location: Right Arm, Patient Position: Sitting, Cuff Size: Normal)   Pulse 100   Temp 98 F (36.7 C) (Temporal)   Resp 18   Ht 5\' 4"  (1.626 m)   Wt 190 lb 3.2 oz (86.3 kg)   SpO2 97%   BMI 32.65 kg/m  Wt Readings  from Last 3 Encounters:  12/25/19 190 lb 3.2 oz (86.3 kg)  12/26/18 180 lb (81.6 kg)  08/25/18 186 lb 6.4 oz (84.6 kg)     Lab Results  Component Value Date   WBC 6.2 01/10/2019   HGB 14.1 01/10/2019   HCT 42.3 01/10/2019   PLT 353.0 01/10/2019   GLUCOSE 81 09/29/2019   CHOL 191 09/29/2019   TRIG 131 09/29/2019   HDL 52 09/29/2019   LDLDIRECT 213.8 08/29/2012   LDLCALC 114 (H) 09/29/2019   ALT 21 09/29/2019   AST 18 09/29/2019   NA 142 09/29/2019   K 3.9 09/29/2019   CL 109 09/29/2019   CREATININE 1.22 (H) 09/29/2019   BUN 18 09/29/2019   CO2 24 09/29/2019   TSH 1.23 01/10/2019   HGBA1C 6.0 11/29/2014    ECHOCARDIOGRAM COMPLETE  Result Date: 02/24/2017                       [facility]*                       [address]                        [place], [place]                            [phone] ------------------------------------------------------------------- Transthoracic Echocardiography Patient:    Carrie [name], Carrie [name] MR #:       [name] Study Date: 02/24/2017 Gender:     F Age:        40 Height:     162.6 cm Weight:     85.6 kg BSA:        2 m^2 Pt. Status: Room:  PERFORMING   [facility], Outpatient  ATTENDING    39, Zola Button  ORDERING     [place] Congress R  REFERRING    [place], [place] R  SONOGRAPHER  Myrene Buddy, RDCS, RVT cc: ------------------------------------------------------------------- LV EF: 65% -   70% ------------------------------------------------------------------- Indications:      (chest pain). ------------------------------------------------------------------- Study Conclusions - Left ventricle: The cavity size was normal. Wall thickness was   normal. Systolic function was vigorous. The estimated ejection   fraction was in the range of 65% to 70%. Wall motion was normal;   there were no regional wall motion abnormalities. Doppler   parameters are consistent with abnormal left ventricular   relaxation (grade 1  diastolic dysfunction). - Aortic valve: Valve area (Vmax): 3 cm^2. ------------------------------------------------------------------- Study data:  No prior study was available for  comparison.  Study status:  Routine.  Procedure:  The patient reported no pain pre or post test. Transthoracic echocardiography. Image quality was adequate. The study was technically difficult, as a result of poor acoustic windows.  Study completion:  There were no complications.         Transthoracic echocardiography.  M-mode, complete 2D, spectral Doppler, and color Doppler.  Birthdate:  Patient birthdate: [birth year].  Age:  Patient is 59 yr old.  Sex:  Gender: female.    BMI: 32.4 kg/m^2.  Blood pressure:     118/88  Patient status:  Outpatient.  Study date:  Study date: 02/24/2017. Study time: 09:40 AM.  Location:  Echo laboratory. ------------------------------------------------------------------- ------------------------------------------------------------------- Left ventricle:  Global longitudinal strain is -19.8% The cavity size was normal. Wall thickness was normal. Systolic function was vigorous. The estimated ejection fraction was in the range of 65% to 70%. Wall motion was normal; there were no regional wall motion abnormalities. Doppler parameters are consistent with abnormal left ventricular relaxation (grade 1 diastolic dysfunction). ------------------------------------------------------------------- Aortic valve:   Structurally normal valve.   Cusp separation was normal.  Doppler:  Transvalvular velocity was within the normal range. There was no stenosis. There was no regurgitation.    Peak velocity ratio of LVOT to aortic valve: 0.87. Valve area (Vmax): 3 cm^2. Indexed valve area (Vmax): 1.5 cm^2/m^2.    Peak gradient (S): 6 mm Hg. ------------------------------------------------------------------- Aorta:  Aortic root: The aortic root was normal in size. Ascending aorta: The ascending aorta was normal in size.  ------------------------------------------------------------------- Mitral valve:   Structurally normal valve.   Leaflet separation was normal.  Doppler:  Transvalvular velocity was within the normal range. There was no evidence for stenosis. There was no regurgitation. ------------------------------------------------------------------- Left atrium:  The atrium was normal in size. ------------------------------------------------------------------- Right ventricle:  The cavity size was normal. Systolic function was normal. ------------------------------------------------------------------- Pulmonic valve:    The valve appears to be grossly normal. Doppler:  There was mild regurgitation. ------------------------------------------------------------------- Tricuspid valve:   Structurally normal valve.   Leaflet separation was normal.  Doppler:  Transvalvular velocity was within the normal range. There was no regurgitation. ------------------------------------------------------------------- Right atrium:  The atrium was normal in size. ------------------------------------------------------------------- Pericardium:  There was no pericardial effusion. ------------------------------------------------------------------- Measurements  Left ventricle                           Value          Reference  LV ID, ED, PLAX chordal           (L)    40    mm       43 - 52  LV ID, ES, PLAX chordal                  23.9  mm       23 - 38  LV fx shortening, PLAX chordal           40    %        >=29  LV PW thickness, ED                      13.8  mm       ----------  IVS/LV PW ratio, ED                      0.88           <=1.3  Stroke  volume, 2D                        60    ml       ----------  Stroke volume/bsa, 2D                    30    ml/m^2   ----------  LV ejection fraction, 1-p A4C            63    %        ----------  LV e&', lateral                           5.77  cm/s     ----------  LV E/e&', lateral                          10.68          ----------  LV s&', lateral                           10.1  cm/s     ----------  LV e&', medial                            6.85  cm/s     ----------  LV E/e&', medial                          8.99           ----------  LV e&', average                           6.31  cm/s     ----------  LV E/e&', average                         9.76           ----------  Longitudinal strain, TDI                 20    %        ----------   Ventricular septum                       Value          Reference  IVS thickness, ED                        12.1  mm       ----------   LVOT                                     Value          Reference  LVOT ID, S                               21    mm       ----------  LVOT area  3.46  cm^2     ----------  LVOT peak velocity, S                    105   cm/s     ----------  LVOT mean velocity, S                    69.6  cm/s     ----------  LVOT VTI, S                              17.3  cm       ----------   Aortic valve                             Value          Reference  Aortic valve peak velocity, S            121   cm/s     ----------  Aortic peak gradient, S                  6     mm Hg    ----------  Velocity ratio, peak, LVOT/AV            0.87           ----------  Aortic valve area, peak velocity         3     cm^2     ----------  Aortic valve area/bsa, peak              1.5   cm^2/m^2 ----------  velocity   Aorta                                    Value          Reference  Aortic root ID, ED                       28    mm       ----------   Left atrium                              Value          Reference  LA ID, A-P, ES                           38    mm       ----------  LA ID/bsa, A-P                           1.9   cm/m^2   <=2.2  LA volume, S                             24    ml       ----------  LA volume/bsa, S                         12    ml/m^2   ----------  LA volume, ES, 1-p A4C  23.9  ml       ----------  LA  volume/bsa, ES, 1-p A4C               12    ml/m^2   ----------  LA volume, ES, 1-p A2C                   23.5  ml       ----------  LA volume/bsa, ES, 1-p A2C               11.8  ml/m^2   ----------   Mitral valve                             Value          Reference  Mitral E-wave peak velocity              61.6  cm/s     ----------  Mitral A-wave peak velocity              91.7  cm/s     ----------  Mitral deceleration time                 173   ms       150 - 230  Mitral E/A ratio, peak                   0.7            ----------   Right atrium                             Value          Reference  RA ID, M-L, ES, A4C                      30.3  mm       30 - 46  RA ID, S-I, ES, A4C                      42.6  mm       34 - 49  RA area, ES, A4C                         10.7  cm^2     8.3 - 19.5  RA volume, ES, A/L                       21.6  ml       ----------  RA volume/bsa, ES, A/L                   10.8  ml/m^2   ----------   Systemic veins                           Value          Reference  Estimated CVP                            3     mm Hg    ----------   Right ventricle  Value          Reference  RV ID, ED, PLAX                          28.4  mm       19 - 38  RV s&', lateral, S                        12.7  cm/s     ----------   Pulmonic valve                           Value          Reference  Pulmonic valve peak velocity, S          128   cm/s     ----------  Pulmonic regurg velocity, ED             143   cm/s     ----------  Pulmonic regurg gradient, ED             8     mm Hg    ---------- Legend: (L)  and  (H)  mark values outside specified reference range. ------------------------------------------------------------------- Prepared and Electronically Authenticated by Kristeen Miss, M.D. 2018-09-19T14:13:01    Assessment & Plan:  Plan  I have discontinued Hal Neer. Gauer's escitalopram and lisinopril. I am also having her start on escitalopram and lisinopril. Additionally, I  am having her maintain her levocetirizine, amLODipine, rosuvastatin, ibuprofen, and buPROPion.  Meds ordered this encounter  Medications  . escitalopram (LEXAPRO) 20 MG tablet    Sig: Take 1 tablet (20 mg total) by mouth daily.    Dispense:  30 tablet    Refill:  2  . lisinopril (ZESTRIL) 30 MG tablet    Sig: Take 1 tablet (30 mg total) by mouth daily.    Dispense:  30 tablet    Refill:  2  . buPROPion (WELLBUTRIN XL) 300 MG 24 hr tablet    Sig: TAKE 1 TABLET EACH DAY.    Dispense:  90 tablet    Refill:  3    Pt needs ov for further refills    Problem List Items Addressed This Visit      Unprioritized   Depression, major, single episode, severe (HCC)    Inc lexapro to 20 mg daily Pt to make app with psych / counseling fmla paper work filled out ----  Out 1 month      Relevant Medications   escitalopram (LEXAPRO) 20 MG tablet   buPROPion (WELLBUTRIN XL) 300 MG 24 hr tablet   Essential hypertension    Poorly controlled will alter medications, encouraged DASH diet, minimize caffeine and obtain adequate sleep. Report concerning symptoms and follow up as directed and as needed      Relevant Medications   lisinopril (ZESTRIL) 30 MG tablet   Other Relevant Orders   Lipid panel   Comprehensive metabolic panel   Sedimentation rate   TSH   HOT FLASHES    Will refer to gyn at pt request-- she wants to discuss hormones       Hyperlipidemia LDL goal <100    Encouraged heart healthy diet, increase exercise, avoid trans fats, consider a krill oil cap daily      Relevant Medications   lisinopril (ZESTRIL) 30 MG tablet    Other Visit Diagnoses    Depression with  anxiety    -  Primary   Relevant Medications   escitalopram (LEXAPRO) 20 MG tablet   buPROPion (WELLBUTRIN XL) 300 MG 24 hr tablet   Other Relevant Orders   Lipid panel   Comprehensive metabolic panel   Sedimentation rate   TSH   Depression, major, single episode, mild (HCC)       Relevant Medications    escitalopram (LEXAPRO) 20 MG tablet   buPROPion (WELLBUTRIN XL) 300 MG 24 hr tablet   Other Relevant Orders   Lipid panel   Comprehensive metabolic panel   Sedimentation rate   TSH   Intractable headache, unspecified chronicity pattern, unspecified headache type       Relevant Medications   escitalopram (LEXAPRO) 20 MG tablet   buPROPion (WELLBUTRIN XL) 300 MG 24 hr tablet   Other Relevant Orders   Lipid panel   Comprehensive metabolic panel   Sedimentation rate   TSH   Hot flashes due to menopause       Relevant Orders   Ambulatory referral to Gynecology      Follow-up: Return in about 3 weeks (around 01/15/2020), or if symptoms worsen or fail to improve, for +.  Donato Schultz, DO

## 2019-12-25 NOTE — Assessment & Plan Note (Signed)
Inc lexapro to 20 mg daily Pt to make app with psych / counseling fmla paper work filled out ----  Out 1 month

## 2019-12-25 NOTE — Assessment & Plan Note (Signed)
Encouraged heart healthy diet, increase exercise, avoid trans fats, consider a krill oil cap daily 

## 2019-12-25 NOTE — Assessment & Plan Note (Signed)
Poorly controlled will alter medications, encouraged DASH diet, minimize caffeine and obtain adequate sleep. Report concerning symptoms and follow up as directed and as needed 

## 2019-12-25 NOTE — Patient Instructions (Signed)
Depression Screening Depression screening is a tool that your health care provider can use to learn if you have symptoms of depression. Depression is a common condition with many symptoms that are also often found in other conditions. Depression is treatable, but it must first be diagnosed. You may not know that certain feelings, thoughts, and behaviors that you are having can be symptoms of depression. Taking a depression screening test can help you and your health care provider decide if you need more assessment, or if you should be referred to a mental health care provider. What are the screening tests?  You may have a physical exam to see if another condition is affecting your mental health. You may have a blood or urine sample taken during the physical exam.  You may be interviewed using a screening tool that was developed from research, such as one of these: ? Patient Health Questionnaire (PHQ). This is a set of either 2 or 9 questions. A health care provider who has been trained to score this screening test uses a guide to assess if your symptoms suggest that you may have depression. ? Hamilton Depression Rating Scale (HAM-D). This is a set of either 17 or 24 questions. You may be asked to take it again during or after your treatment, to see if your depression has gotten better. ? Beck Depression Inventory (BDI). This is a set of 21 multiple choice questions. Your health care provider scores your answers to assess:  Your level of depression, ranging from mild to severe.  Your response to treatment.  Your health care provider may talk with you about your daily activities, such as eating, sleeping, work, and recreation, and ask if you have had any changes in activity.  Your health care provider may ask you to see a mental health specialist, such as a psychiatrist or psychologist, for more evaluation. Who should be screened for depression?   All adults, including adults with a family history  of a mental health disorder.  Adolescents who are 12-18 years old.  People who are recovering from a myocardial infarction (MI).  Pregnant women, or women who have given birth.  People who have a long-term (chronic) illness.  Anyone who has been diagnosed with another type of a mental health disorder.  Anyone who has symptoms that could show depression. What do my results mean? Your health care provider will review the results of your depression screening, physical exam, and lab tests. Positive screens suggest that you may have depression. Screening is the first step in getting the care that you may need. It is up to you to get your screening results. Ask your health care provider, or the department that is doing your screening tests, when your results will be ready. Talk with your health care provider about your results and diagnosis. A diagnosis of depression is made using the Diagnostic and Statistical Manual of Mental Disorders (DSM-V). This is a book that lists the number and type of symptoms that must be present for a health care provider to give a specific diagnosis.  Your health care provider may work with you to treat your symptoms of depression, or your health care provider may help you find a mental health provider who can assess, diagnose, and treat your depression. Get help right away if:  You have thoughts about hurting yourself or others. If you ever feel like you may hurt yourself or others, or have thoughts about taking your own life, get help right away. You   can go to your nearest emergency department or call:  Your local emergency services (911 in the [place]  A suicide crisis helpline, such as the National Suicide Prevention Lifeline at 1-800-273-8255. This is open 24 hours a day. Summary  Depression screening is the first step in getting the help that you may need.  If your screening test shows symptoms of depression (is positive), your health care provider may ask  you to see a mental health provider.  Anyone who is age 12 or older should be screened for depression. This information is not intended to replace advice given to you by your health care provider. Make sure you discuss any questions you have with your health care provider. Document Revised: 05/07/2017 Document Reviewed: 10/09/2016 Elsevier Patient Education  2020 [employer]  

## 2019-12-25 NOTE — Assessment & Plan Note (Signed)
Will refer to gyn at pt request-- she wants to discuss hormones

## 2020-01-02 DIAGNOSIS — M79641 Pain in right hand: Secondary | ICD-10-CM | POA: Diagnosis not present

## 2020-01-02 DIAGNOSIS — M79642 Pain in left hand: Secondary | ICD-10-CM | POA: Diagnosis not present

## 2020-01-09 DIAGNOSIS — F4323 Adjustment disorder with mixed anxiety and depressed mood: Secondary | ICD-10-CM | POA: Diagnosis not present

## 2020-01-26 DIAGNOSIS — E559 Vitamin D deficiency, unspecified: Secondary | ICD-10-CM | POA: Diagnosis not present

## 2020-01-26 DIAGNOSIS — R635 Abnormal weight gain: Secondary | ICD-10-CM | POA: Diagnosis not present

## 2020-01-26 DIAGNOSIS — R232 Flushing: Secondary | ICD-10-CM | POA: Diagnosis not present

## 2020-01-26 DIAGNOSIS — N958 Other specified menopausal and perimenopausal disorders: Secondary | ICD-10-CM | POA: Diagnosis not present

## 2020-01-26 DIAGNOSIS — R5382 Chronic fatigue, unspecified: Secondary | ICD-10-CM | POA: Diagnosis not present

## 2020-01-26 DIAGNOSIS — R5383 Other fatigue: Secondary | ICD-10-CM | POA: Diagnosis not present

## 2020-01-26 DIAGNOSIS — R7309 Other abnormal glucose: Secondary | ICD-10-CM | POA: Diagnosis not present

## 2020-01-26 DIAGNOSIS — N951 Menopausal and female climacteric states: Secondary | ICD-10-CM | POA: Diagnosis not present

## 2020-01-26 DIAGNOSIS — E782 Mixed hyperlipidemia: Secondary | ICD-10-CM | POA: Diagnosis not present

## 2020-01-31 DIAGNOSIS — Z1339 Encounter for screening examination for other mental health and behavioral disorders: Secondary | ICD-10-CM | POA: Diagnosis not present

## 2020-01-31 DIAGNOSIS — Z1331 Encounter for screening for depression: Secondary | ICD-10-CM | POA: Diagnosis not present

## 2020-01-31 DIAGNOSIS — M2559 Pain in other specified joint: Secondary | ICD-10-CM | POA: Diagnosis not present

## 2020-01-31 DIAGNOSIS — F529 Unspecified sexual dysfunction not due to a substance or known physiological condition: Secondary | ICD-10-CM | POA: Diagnosis not present

## 2020-01-31 DIAGNOSIS — N958 Other specified menopausal and perimenopausal disorders: Secondary | ICD-10-CM | POA: Diagnosis not present

## 2020-01-31 DIAGNOSIS — F5101 Primary insomnia: Secondary | ICD-10-CM | POA: Diagnosis not present

## 2020-02-05 DIAGNOSIS — F4323 Adjustment disorder with mixed anxiety and depressed mood: Secondary | ICD-10-CM | POA: Diagnosis not present

## 2020-02-06 ENCOUNTER — Encounter: Payer: Self-pay | Admitting: Family Medicine

## 2020-02-06 ENCOUNTER — Other Ambulatory Visit: Payer: Self-pay

## 2020-02-06 ENCOUNTER — Ambulatory Visit (INDEPENDENT_AMBULATORY_CARE_PROVIDER_SITE_OTHER): Payer: Federal, State, Local not specified - PPO | Admitting: Family Medicine

## 2020-02-06 DIAGNOSIS — F322 Major depressive disorder, single episode, severe without psychotic features: Secondary | ICD-10-CM | POA: Diagnosis not present

## 2020-02-06 NOTE — Assessment & Plan Note (Signed)
con't meds New note for work  She will retire end of sept con't counseling

## 2020-02-06 NOTE — Progress Notes (Signed)
Patient ID: KRISALYN YANKOWSKI, female    DOB: 29-Apr-1961  Age: 59 y.o. MRN: [id number]    Subjective:  Subjective  HPI LYNSIE MCWATTERS presents for f/u depression --- she is seeing a counselor but the situation at work is not any better.  They have offered early retirement   Review of Systems  Constitutional: Negative for appetite change, diaphoresis, fatigue and unexpected weight change.  Eyes: Negative for pain, redness and visual disturbance.  Respiratory: Negative for cough, chest tightness, shortness of breath and wheezing.   Cardiovascular: Negative for chest pain, palpitations and leg swelling.  Endocrine: Negative for cold intolerance, heat intolerance, polydipsia, polyphagia and polyuria.  Genitourinary: Negative for difficulty urinating, dysuria and frequency.  Neurological: Negative for dizziness, light-headedness, numbness and headaches.  Psychiatric/Behavioral: Positive for dysphoric mood. Negative for self-injury and sleep disturbance. The patient is nervous/anxious.     History Past Medical History:  Diagnosis Date  . Hyperlipidemia   . Hypertension     She has a past surgical history that includes Abdominal hysterectomy.   Her family history includes Breast cancer in her unknown relative; Coronary artery disease in her unknown relative; Diabetes in her sister; Hypertension in her unknown relative.She reports that she quit smoking about 6 years ago. Her smoking use included cigarettes. She has never used smokeless tobacco. She reports current alcohol use. She reports that she does not use drugs.  Current Outpatient Medications on File Prior to Visit  Medication Sig Dispense Refill  . amLODipine (NORVASC) 5 MG tablet TAKE 1 TABLET ONCE DAILY. 90 tablet 2  . buPROPion (WELLBUTRIN XL) 300 MG 24 hr tablet TAKE 1 TABLET EACH DAY. 90 tablet 3  . escitalopram (LEXAPRO) 20 MG tablet Take 1 tablet (20 mg total) by mouth daily. 30 tablet 2  . ibuprofen (ADVIL) 600 MG tablet TAKE 1  TABLET BY MOUTH 3 TIMES A DAY. 30 tablet 0  . levocetirizine (XYZAL) 5 MG tablet TAKE 1 TABLET IN THE P.M. 90 tablet 0  . lisinopril (ZESTRIL) 30 MG tablet Take 1 tablet (30 mg total) by mouth daily. 30 tablet 2  . rosuvastatin (CRESTOR) 40 MG tablet Take 1 tablet (40 mg total) by mouth daily. 30 tablet 2   No current facility-administered medications on file prior to visit.     Objective:  Objective  Physical Exam Vitals and nursing note reviewed.  Constitutional:      Appearance: She is well-developed.  HENT:     Head: Normocephalic and atraumatic.  Eyes:     Conjunctiva/sclera: Conjunctivae normal.  Neck:     Thyroid: No thyromegaly.     Vascular: No carotid bruit or JVD.  Cardiovascular:     Rate and Rhythm: Normal rate and regular rhythm.     Heart sounds: Normal heart sounds. No murmur heard.   Pulmonary:     Effort: Pulmonary effort is normal. No respiratory distress.     Breath sounds: Normal breath sounds. No wheezing or rales.  Chest:     Chest wall: No tenderness.  Musculoskeletal:     Cervical back: Normal range of motion and neck supple.  Neurological:     Mental Status: She is alert and oriented to person, place, and time.    BP (!) 118/100 (BP Location: Left Arm, Patient Position: Sitting, Cuff Size: Normal)   Pulse (!) 111   Temp 98.2 F (36.8 C) (Oral)   Resp 18   Ht 5\' 4"  (1.626 m)   Wt 185 lb 9.6 oz (  84.2 kg)   SpO2 97%   BMI 31.86 kg/m  Wt Readings from Last 3 Encounters:  02/06/20 185 lb 9.6 oz (84.2 kg)  12/25/19 190 lb 3.2 oz (86.3 kg)  12/26/18 180 lb (81.6 kg)     Lab Results  Component Value Date   WBC 6.2 01/10/2019   HGB 14.1 01/10/2019   HCT 42.3 01/10/2019   PLT 353.0 01/10/2019   GLUCOSE 86 12/25/2019   CHOL 175 12/25/2019   TRIG 72.0 12/25/2019   HDL 60.80 12/25/2019   LDLDIRECT 213.8 08/29/2012   LDLCALC 100 (H) 12/25/2019   ALT 24 12/25/2019   AST 19 12/25/2019   NA 140 12/25/2019   K 4.3 12/25/2019   CL 109  12/25/2019   CREATININE 0.98 12/25/2019   BUN 17 12/25/2019   CO2 26 12/25/2019   TSH 1.53 12/25/2019   HGBA1C 6.0 11/29/2014    ECHOCARDIOGRAM COMPLETE  Result Date: 02/24/2017                       [facility]*                       [address]                        [place], [place] [address]                            [phone] ------------------------------------------------------------------- Transthoracic Echocardiography Patient:    Nelli, Swalley MR #:       [id number] Study Date: 02/24/2017 Gender:     F Age:        77 Height:     162.6 cm Weight:     85.6 kg BSA:        2 m^2 Pt. Status: Room:  PERFORMING   [facility], Outpatient  ATTENDING    Zola Button, Grayling Congress  ORDERING     Seabron Spates R  REFERRING    [place], Myrene Buddy R  SONOGRAPHER  Tana Felts, RDCS, RVT cc: ------------------------------------------------------------------- LV EF: 65% -   70% ------------------------------------------------------------------- Indications:      (chest pain). ------------------------------------------------------------------- Study Conclusions - Left ventricle: The cavity size was normal. Wall thickness was   normal. Systolic function was vigorous. The estimated ejection   fraction was in the range of 65% to 70%. Wall motion was normal;   there were no regional wall motion abnormalities. Doppler   parameters are consistent with abnormal left ventricular   relaxation (grade 1 diastolic dysfunction). - Aortic valve: Valve area (Vmax): 3 cm^2. ------------------------------------------------------------------- Study data:  No prior study was available for comparison.  Study status:  Routine.  Procedure:  The patient reported no pain pre or post test. Transthoracic echocardiography. Image quality was adequate. The study was technically difficult, as a result of poor acoustic windows.  Study completion:  There were no complications.         Transthoracic echocardiography.  M-mode,  complete 2D, spectral Doppler, and color Doppler.  Birthdate:  Patient birthdate: 03-May-1961.  Age:  Patient is 59 yr old.  Sex:  Gender: female.    BMI: 32.4 kg/m^2.  Blood pressure:     118/88  Patient status:  Outpatient.  Study date:  Study date: 02/24/2017. Study time: 09:40 AM.  Location:  Echo laboratory. ------------------------------------------------------------------- ------------------------------------------------------------------- Left ventricle:  Global longitudinal strain  is -19.8% The cavity size was normal. Wall thickness was normal. Systolic function was vigorous. The estimated ejection fraction was in the range of 65% to 70%. Wall motion was normal; there were no regional wall motion abnormalities. Doppler parameters are consistent with abnormal left ventricular relaxation (grade 1 diastolic dysfunction). ------------------------------------------------------------------- Aortic valve:   Structurally normal valve.   Cusp separation was normal.  Doppler:  Transvalvular velocity was within the normal range. There was no stenosis. There was no regurgitation.    Peak velocity ratio of LVOT to aortic valve: 0.87. Valve area (Vmax): 3 cm^2. Indexed valve area (Vmax): 1.5 cm^2/m^2.    Peak gradient (S): 6 mm Hg. ------------------------------------------------------------------- Aorta:  Aortic root: The aortic root was normal in size. Ascending aorta: The ascending aorta was normal in size. ------------------------------------------------------------------- Mitral valve:   Structurally normal valve.   Leaflet separation was normal.  Doppler:  Transvalvular velocity was within the normal range. There was no evidence for stenosis. There was no regurgitation. ------------------------------------------------------------------- Left atrium:  The atrium was normal in size. ------------------------------------------------------------------- Right ventricle:  The cavity size was normal. Systolic function was  normal. ------------------------------------------------------------------- Pulmonic valve:    The valve appears to be grossly normal. Doppler:  There was mild regurgitation. ------------------------------------------------------------------- Tricuspid valve:   Structurally normal valve.   Leaflet separation was normal.  Doppler:  Transvalvular velocity was within the normal range. There was no regurgitation. ------------------------------------------------------------------- Right atrium:  The atrium was normal in size. ------------------------------------------------------------------- Pericardium:  There was no pericardial effusion. ------------------------------------------------------------------- Measurements  Left ventricle                           Value          Reference  LV ID, ED, PLAX chordal           (L)    40    mm       43 - 52  LV ID, ES, PLAX chordal                  23.9  mm       23 - 38  LV fx shortening, PLAX chordal           40    %        >=29  LV PW thickness, ED                      13.8  mm       ----------  IVS/LV PW ratio, ED                      0.88           <=1.3  Stroke volume, 2D                        60    ml       ----------  Stroke volume/bsa, 2D                    30    ml/m^2   ----------  LV ejection fraction, 1-p A4C            63    %        ----------  LV e&', lateral  5.77  cm/s     ----------  LV E/e&', lateral                         10.68          ----------  LV s&', lateral                           10.1  cm/s     ----------  LV e&', medial                            6.85  cm/s     ----------  LV E/e&', medial                          8.99           ----------  LV e&', average                           6.31  cm/s     ----------  LV E/e&', average                         9.76           ----------  Longitudinal strain, TDI                 20    %        ----------   Ventricular septum                       Value          Reference  IVS  thickness, ED                        12.1  mm       ----------   LVOT                                     Value          Reference  LVOT ID, S                               21    mm       ----------  LVOT area                                3.46  cm^2     ----------  LVOT peak velocity, S                    105   cm/s     ----------  LVOT mean velocity, S                    69.6  cm/s     ----------  LVOT VTI, S                              17.3  cm       ----------   Aortic  valve                             Value          Reference  Aortic valve peak velocity, S            121   cm/s     ----------  Aortic peak gradient, S                  6     mm Hg    ----------  Velocity ratio, peak, LVOT/AV            0.87           ----------  Aortic valve area, peak velocity         3     cm^2     ----------  Aortic valve area/bsa, peak              1.5   cm^2/m^2 ----------  velocity   Aorta                                    Value          Reference  Aortic root ID, ED                       28    mm       ----------   Left atrium                              Value          Reference  LA ID, A-P, ES                           38    mm       ----------  LA ID/bsa, A-P                           1.9   cm/m^2   <=2.2  LA volume, S                             24    ml       ----------  LA volume/bsa, S                         12    ml/m^2   ----------  LA volume, ES, 1-p A4C                   23.9  ml       ----------  LA volume/bsa, ES, 1-p A4C               12    ml/m^2   ----------  LA volume, ES, 1-p A2C                   23.5  ml       ----------  LA volume/bsa, ES, 1-p A2C               11.8  ml/m^2   ----------   Mitral valve  Value          Reference  Mitral E-wave peak velocity              61.6  cm/s     ----------  Mitral A-wave peak velocity              91.7  cm/s     ----------  Mitral deceleration time                 173   ms       150 - 230  Mitral E/A ratio, peak                   0.7             ----------   Right atrium                             Value          Reference  RA ID, M-L, ES, A4C                      30.3  mm       30 - 46  RA ID, S-I, ES, A4C                      42.6  mm       34 - 49  RA area, ES, A4C                         10.7  cm^2     8.3 - 19.5  RA volume, ES, A/L                       21.6  ml       ----------  RA volume/bsa, ES, A/L                   10.8  ml/m^2   ----------   Systemic veins                           Value          Reference  Estimated CVP                            3     mm Hg    ----------   Right ventricle                          Value          Reference  RV ID, ED, PLAX                          28.4  mm       19 - 38  RV s&', lateral, S                        12.7  cm/s     ----------   Pulmonic valve                           Value          Reference  Pulmonic valve peak velocity, S          128   cm/s     ----------  Pulmonic regurg velocity, ED             143   cm/s     ----------  Pulmonic regurg gradient, ED             8     mm Hg    ---------- Legend: (L)  and  (H)  mark values outside specified reference range. ------------------------------------------------------------------- Prepared and Electronically Authenticated by Kristeen Miss, M.D. 2018-09-19T14:13:01    Assessment & Plan:  Plan  I am having Hal Neer. Flynn maintain her levocetirizine, amLODipine, rosuvastatin, ibuprofen, escitalopram, lisinopril, and buPROPion.  No orders of the defined types were placed in this encounter.   Problem List Items Addressed This Visit      Unprioritized   Depression, major, single episode, severe (HCC)    con't meds New note for work  She will retire end of sept con't counseling           Follow-up: Return in about 4 weeks (around 03/05/2020), or if symptoms worsen or fail to improve.  Donato Schultz, DO

## 2020-02-07 ENCOUNTER — Telehealth: Payer: Self-pay

## 2020-02-07 DIAGNOSIS — I10 Essential (primary) hypertension: Secondary | ICD-10-CM | POA: Diagnosis not present

## 2020-02-07 DIAGNOSIS — Z6830 Body mass index (BMI) 30.0-30.9, adult: Secondary | ICD-10-CM | POA: Diagnosis not present

## 2020-02-07 NOTE — Telephone Encounter (Addendum)
Pt dropped off continuing FMLA forms for completion along with envelope to mail it off in.  Once form is completed, please call pt at [phone] to let pt know when it was mailed off to her employer.  Form placed in provider's box for pick up and completion.

## 2020-02-14 NOTE — Telephone Encounter (Signed)
Spoke with patient. Forms have been filled out waiting for sig.

## 2020-02-14 NOTE — Telephone Encounter (Signed)
Patient is calling to check the status of form

## 2020-02-16 NOTE — Telephone Encounter (Signed)
Forms received from provider. Placed in mail per request of patient.

## 2020-02-20 DIAGNOSIS — F4323 Adjustment disorder with mixed anxiety and depressed mood: Secondary | ICD-10-CM | POA: Diagnosis not present

## 2020-02-21 DIAGNOSIS — Z6830 Body mass index (BMI) 30.0-30.9, adult: Secondary | ICD-10-CM | POA: Diagnosis not present

## 2020-02-21 DIAGNOSIS — R7309 Other abnormal glucose: Secondary | ICD-10-CM | POA: Diagnosis not present

## 2020-02-29 ENCOUNTER — Other Ambulatory Visit: Payer: Self-pay

## 2020-02-29 ENCOUNTER — Ambulatory Visit (HOSPITAL_BASED_OUTPATIENT_CLINIC_OR_DEPARTMENT_OTHER)
Admission: RE | Admit: 2020-02-29 | Discharge: 2020-02-29 | Disposition: A | Discharge status: Home / Self Care | Payer: Federal, State, Local not specified - PPO | Source: Ambulatory Visit | Attending: Family Medicine | Admitting: Family Medicine

## 2020-02-29 ENCOUNTER — Encounter: Payer: Self-pay | Admitting: Family Medicine

## 2020-02-29 ENCOUNTER — Telehealth (INDEPENDENT_AMBULATORY_CARE_PROVIDER_SITE_OTHER): Payer: Federal, State, Local not specified - PPO | Admitting: Family Medicine

## 2020-02-29 VITALS — BP 136/95 | Ht 64.0 in

## 2020-02-29 DIAGNOSIS — R059 Cough, unspecified: Secondary | ICD-10-CM

## 2020-02-29 DIAGNOSIS — R05 Cough: Secondary | ICD-10-CM

## 2020-02-29 DIAGNOSIS — R1013 Epigastric pain: Secondary | ICD-10-CM | POA: Diagnosis not present

## 2020-02-29 IMAGING — DX DG CHEST 2V
2 series · 2 of 2 positions shown · non-contrast
Comparison: [DATE]

CLINICAL DATA: Cough

EXAM:
CHEST - 2 VIEW

[chest pa]
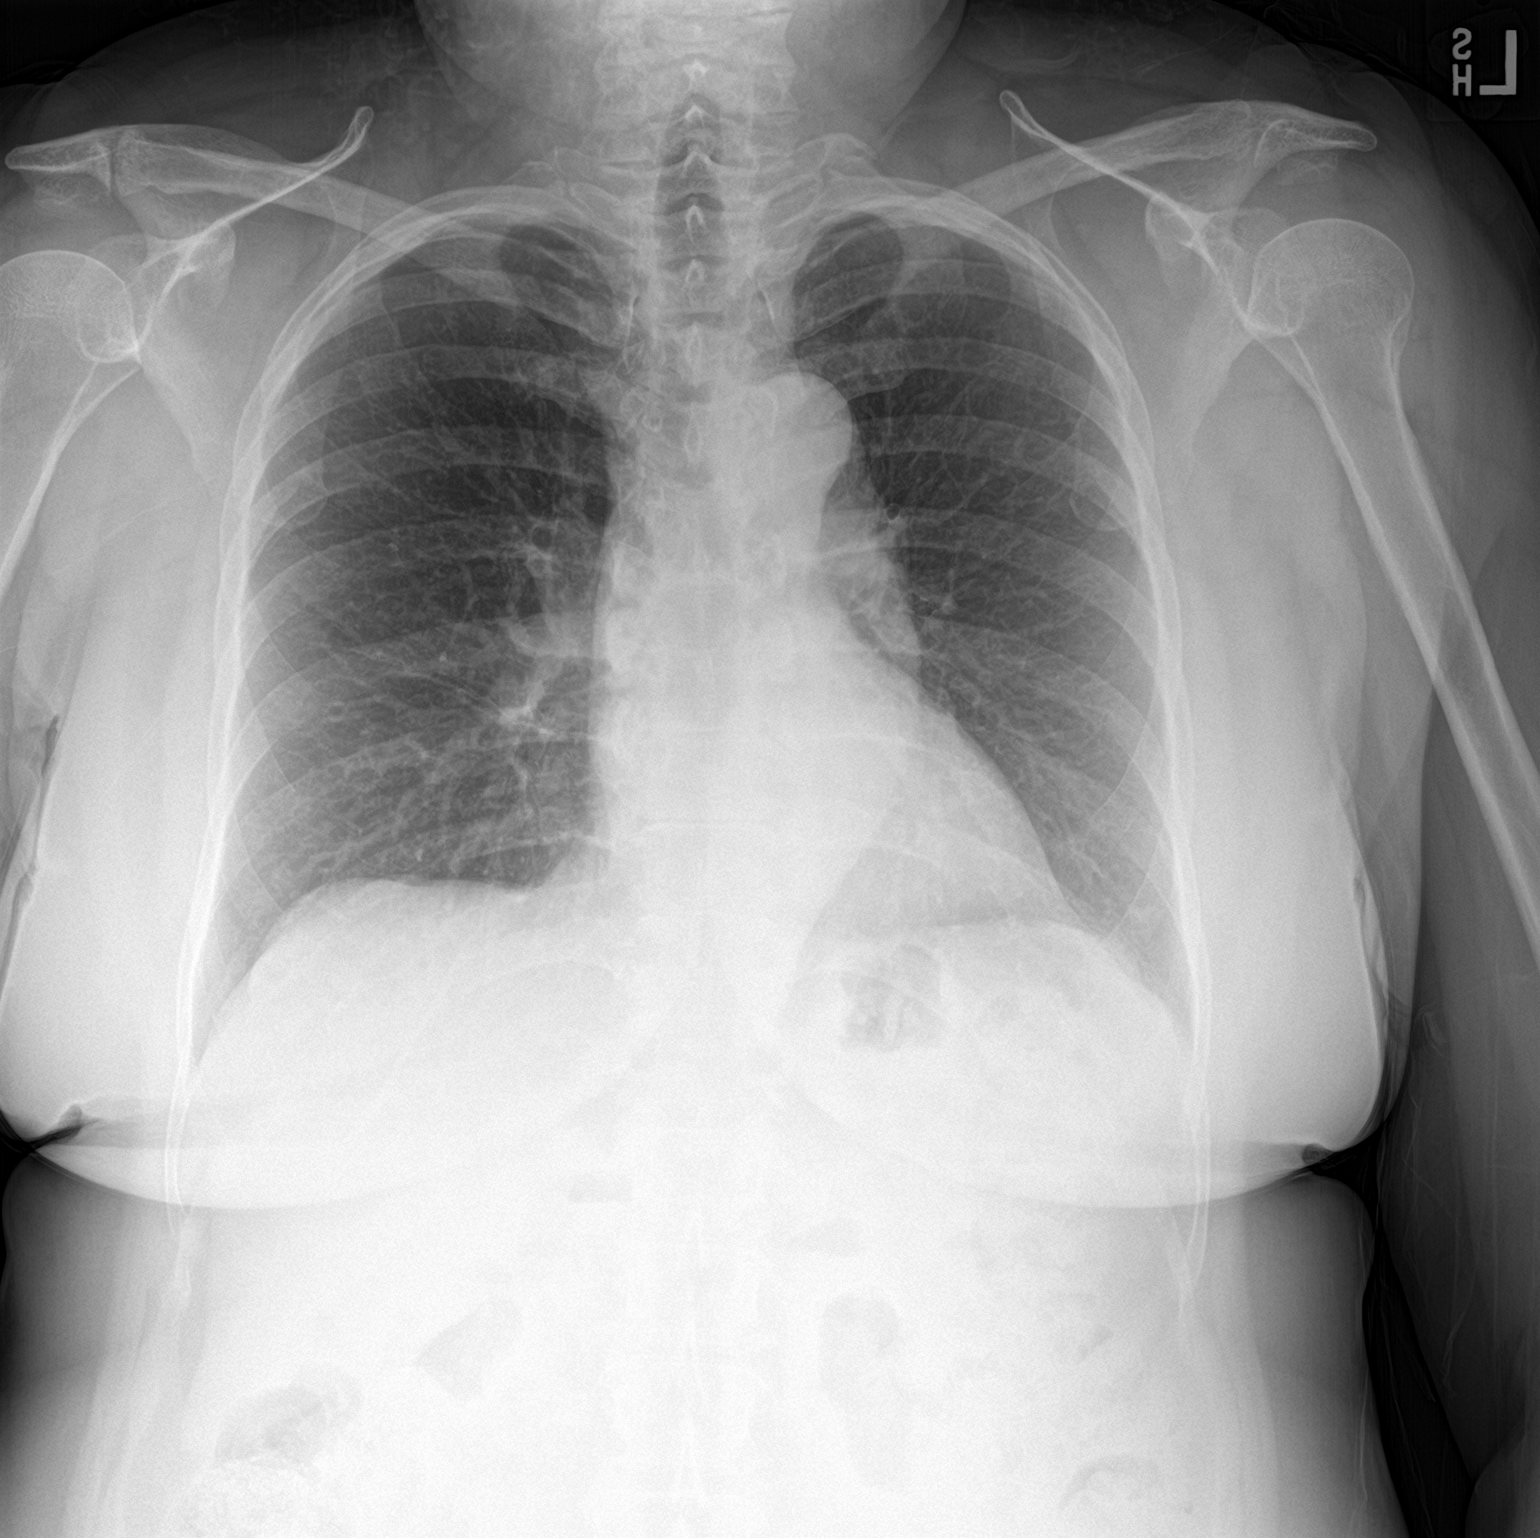

[chest lat]
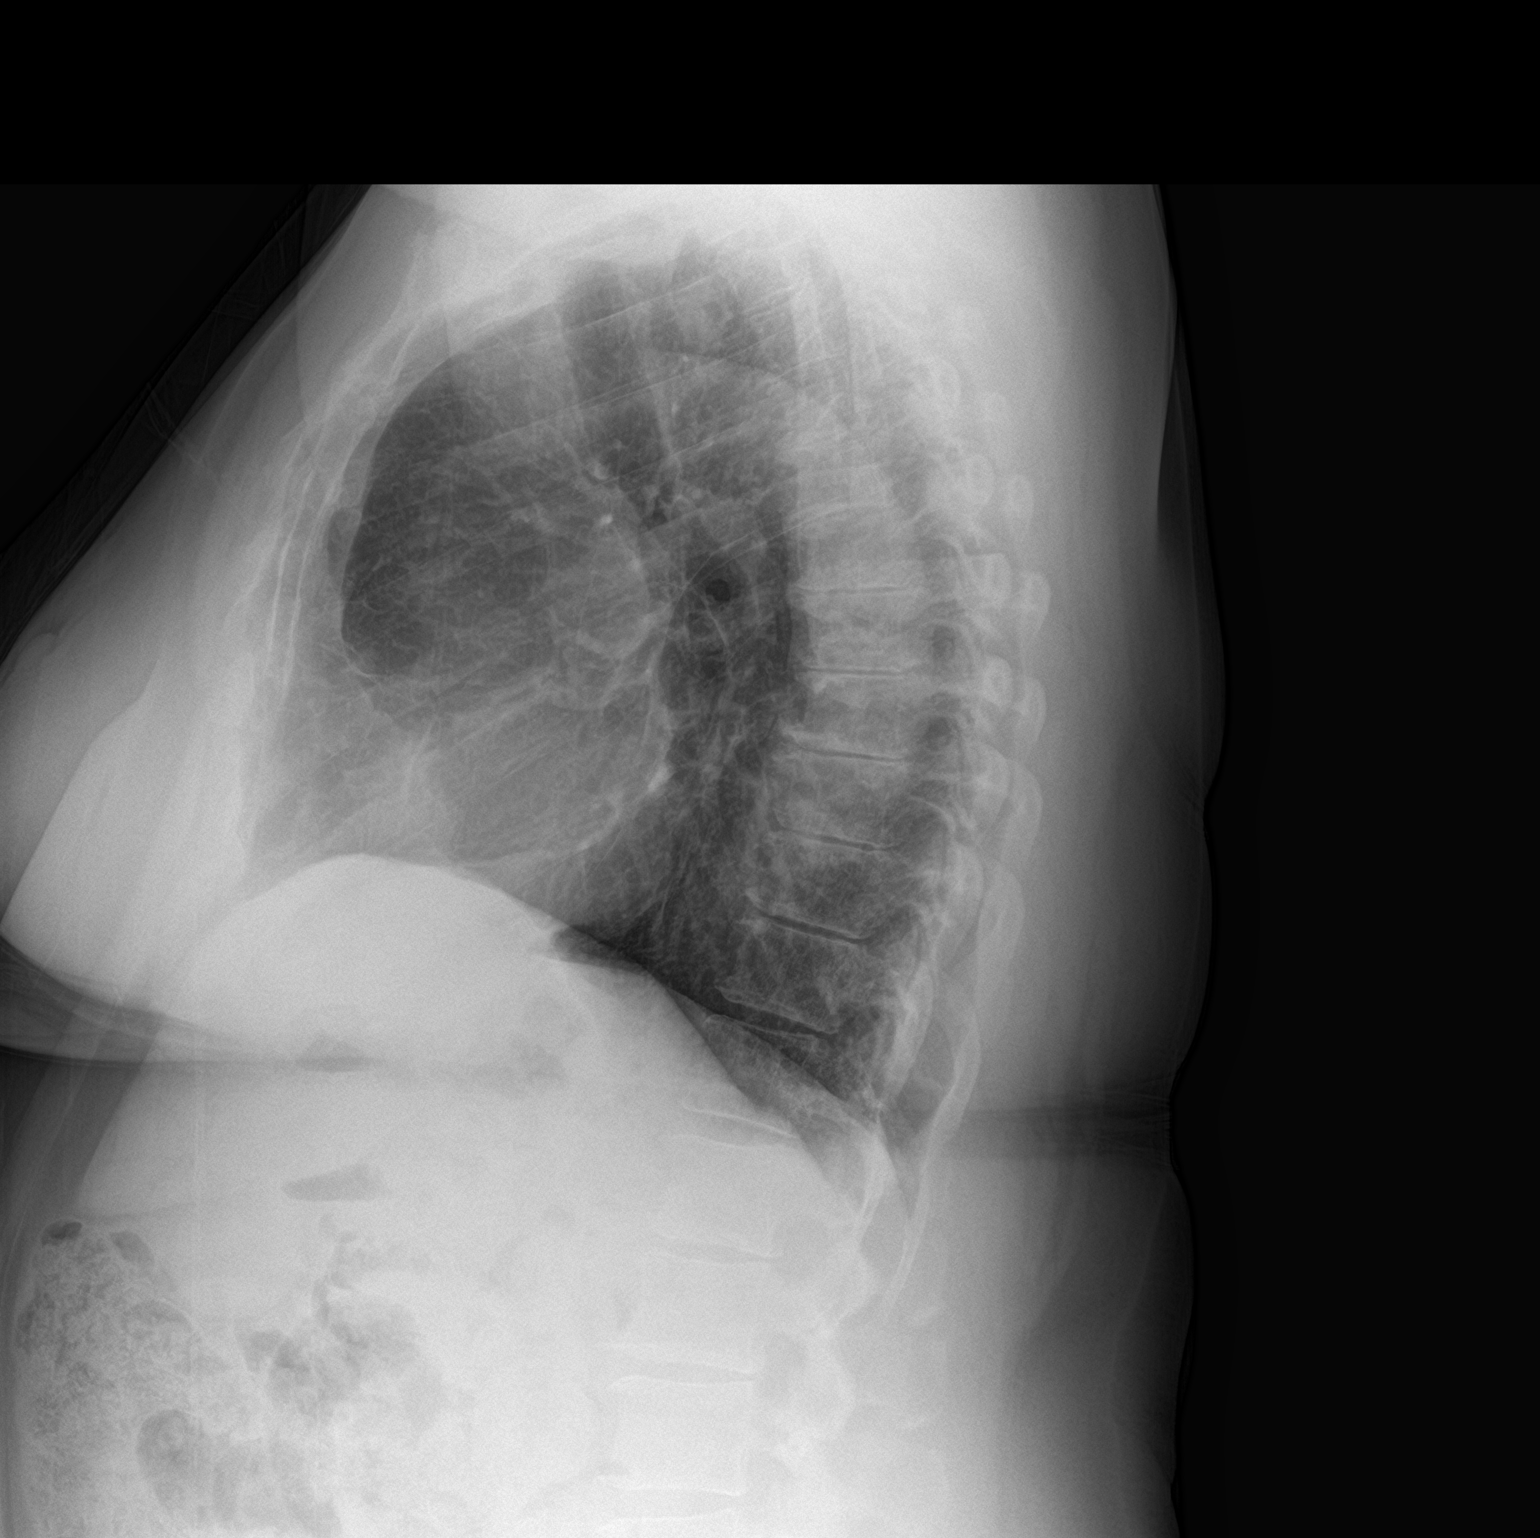

[2 of 2 positions shown; findings below may reference images not displayed]

FINDINGS: Lungs are clear. Heart size and pulmonary vascularity are normal. No
adenopathy. There is degenerative change in the thoracic spine.
IMPRESSION: Lungs clear.  Cardiac silhouette within normal limits.

## 2020-02-29 MED ORDER — PANTOPRAZOLE SODIUM 40 MG PO TBEC: 40.0000 mg | DELAYED_RELEASE_TABLET | Freq: Every day | ORAL | 3 refills | Status: DC

## 2020-02-29 MED ORDER — AZITHROMYCIN 250 MG PO TABS: ORAL_TABLET | ORAL | 0 refills | Status: DC

## 2020-02-29 NOTE — Progress Notes (Signed)
Virtual Visit via Video Note  I connected with Carrie [name] on 02/29/20 at 10:40 AM EDT by a video enabled telemedicine application and verified that I am speaking with the correct person using two identifiers.   Location: Patient: home alone  Provider: office    I discussed the limitations of evaluation and management by telemedicine and the availability of in person appointments. The patient expressed understanding and agreed to proceed.  History of Present Illness: Pt is home c/o cough and sinus congestion and she is taking sinus meds and all med    Observations/Objective: Vitals:   02/29/20 1026  BP: (!) 136/95     Assessment and Plan: 1. Dyspepsia Avoid spicy acidic foods Avoid caffeine , peppermint etc  - pantoprazole (PROTONIX) 40 MG tablet; Take 1 tablet (40 mg total) by mouth daily.  Dispense: 30 tablet; Refill: 3  2. Cough con't allergy med but avoid decongestants  abx per orders and get cxr today  - azithromycin (ZITHROMAX Z-PAK) 250 MG tablet; As directed  Dispense: 6 each; Refill: 0 - DG Chest 2 View; Future   Follow Up Instructions:    I discussed the assessment and treatment plan with the patient. The patient was provided an opportunity to ask questions and all were answered. The patient agreed with the plan and demonstrated an understanding of the instructions.   The patient was advised to call back or seek an in-person evaluation if the symptoms worsen or if the condition fails to improve as anticipated.  I provided 25 minutes of non-face-to-face time during this encounter.   Donato Schultz, DO

## 2020-03-12 DIAGNOSIS — Z6830 Body mass index (BMI) 30.0-30.9, adult: Secondary | ICD-10-CM | POA: Diagnosis not present

## 2020-03-12 DIAGNOSIS — I10 Essential (primary) hypertension: Secondary | ICD-10-CM | POA: Diagnosis not present

## 2020-04-03 DIAGNOSIS — Z6830 Body mass index (BMI) 30.0-30.9, adult: Secondary | ICD-10-CM | POA: Diagnosis not present

## 2020-04-03 DIAGNOSIS — R7303 Prediabetes: Secondary | ICD-10-CM | POA: Diagnosis not present

## 2020-04-17 DIAGNOSIS — I10 Essential (primary) hypertension: Secondary | ICD-10-CM | POA: Diagnosis not present

## 2020-04-17 DIAGNOSIS — Z6830 Body mass index (BMI) 30.0-30.9, adult: Secondary | ICD-10-CM | POA: Diagnosis not present

## 2020-05-01 DIAGNOSIS — E782 Mixed hyperlipidemia: Secondary | ICD-10-CM | POA: Diagnosis not present

## 2020-05-01 DIAGNOSIS — Z6830 Body mass index (BMI) 30.0-30.9, adult: Secondary | ICD-10-CM | POA: Diagnosis not present

## 2020-05-28 ENCOUNTER — Other Ambulatory Visit: Payer: Self-pay | Admitting: Family Medicine

## 2020-08-13 ENCOUNTER — Other Ambulatory Visit: Payer: Self-pay | Admitting: Family Medicine

## 2020-08-13 DIAGNOSIS — R059 Cough, unspecified: Secondary | ICD-10-CM

## 2020-08-13 DIAGNOSIS — I10 Essential (primary) hypertension: Secondary | ICD-10-CM

## 2020-08-13 DIAGNOSIS — E785 Hyperlipidemia, unspecified: Secondary | ICD-10-CM

## 2020-09-12 ENCOUNTER — Other Ambulatory Visit: Payer: Self-pay | Admitting: Family Medicine

## 2020-09-12 DIAGNOSIS — L299 Pruritus, unspecified: Secondary | ICD-10-CM

## 2020-09-17 DIAGNOSIS — K080 Exfoliation of teeth due to systemic causes: Secondary | ICD-10-CM | POA: Diagnosis not present

## 2020-09-25 DIAGNOSIS — F32A Depression, unspecified: Secondary | ICD-10-CM | POA: Diagnosis not present

## 2020-09-25 DIAGNOSIS — I10 Essential (primary) hypertension: Secondary | ICD-10-CM | POA: Diagnosis not present

## 2020-09-25 DIAGNOSIS — Z01419 Encounter for gynecological examination (general) (routine) without abnormal findings: Secondary | ICD-10-CM | POA: Diagnosis not present

## 2020-09-25 DIAGNOSIS — Z90710 Acquired absence of both cervix and uterus: Secondary | ICD-10-CM | POA: Diagnosis not present

## 2020-11-14 ENCOUNTER — Other Ambulatory Visit: Payer: Self-pay | Admitting: Obstetrics and Gynecology

## 2020-11-14 DIAGNOSIS — Z1231 Encounter for screening mammogram for malignant neoplasm of breast: Secondary | ICD-10-CM

## 2020-12-16 ENCOUNTER — Other Ambulatory Visit: Payer: Self-pay | Admitting: Family Medicine

## 2020-12-16 DIAGNOSIS — E785 Hyperlipidemia, unspecified: Secondary | ICD-10-CM

## 2020-12-16 DIAGNOSIS — I10 Essential (primary) hypertension: Secondary | ICD-10-CM

## 2020-12-24 ENCOUNTER — Other Ambulatory Visit: Payer: Self-pay

## 2020-12-24 ENCOUNTER — Ambulatory Visit: Payer: Federal, State, Local not specified - PPO | Admitting: Family Medicine

## 2020-12-24 ENCOUNTER — Encounter: Payer: Self-pay | Admitting: Family Medicine

## 2020-12-24 VITALS — BP 114/74 | HR 94 | Temp 98.0°F | Resp 18 | Ht 64.0 in | Wt 183.0 lb

## 2020-12-24 DIAGNOSIS — I10 Essential (primary) hypertension: Secondary | ICD-10-CM | POA: Diagnosis not present

## 2020-12-24 DIAGNOSIS — R5383 Other fatigue: Secondary | ICD-10-CM | POA: Diagnosis not present

## 2020-12-24 DIAGNOSIS — E785 Hyperlipidemia, unspecified: Secondary | ICD-10-CM

## 2020-12-24 DIAGNOSIS — T7840XA Allergy, unspecified, initial encounter: Secondary | ICD-10-CM

## 2020-12-24 MED ORDER — LORATADINE 10 MG PO TABS: 10.0000 mg | ORAL_TABLET | Freq: Every day | ORAL | 11 refills | Status: AC

## 2020-12-24 MED ORDER — FLUTICASONE PROPIONATE 50 MCG/ACT NA SUSP
2.0000 | Freq: Every day | NASAL | 6 refills | Status: AC
Start: 2020-12-24 — End: ?

## 2020-12-24 NOTE — Progress Notes (Addendum)
Subjective:   By signing my name below, I, Shehryar Baig, attest that this documentation has been prepared under the direction and in the presence of Dr. Seabron Spates, DO. 12/24/2020    Patient ID: Carrie [name], female    DOB: 04-22-1961, 60 y.o.   MRN: [id number]  Chief Complaint  Patient presents with   Hyperlipidemia   Hypertension   Follow-up    HPI Patient is in today for a office visit. She complains of lack of energy through out the day. She has joined a gym to help increase her energy. She does not think her symptoms are due to her depression. She also notes having sinus congestion due to her allergies. She does not use Flonase to manage her symptoms. She has afrin at home but has not used it yet. She is planning on taking Claritin to manage her symptoms  She is due for the shingles vaccines and is interested in getting it after her next physical exam. She is interested in setting up her next comprehensive physical exam on October, 2022.   Past Medical History:  Diagnosis Date   Hyperlipidemia    Hypertension     Past Surgical History:  Procedure Laterality Date   ABDOMINAL HYSTERECTOMY      Family History  Problem Relation Age of Onset   Breast cancer Unknown    Coronary artery disease Unknown    Diabetes Sister    Hypertension Unknown     Social History   Socioeconomic History   Marital status: Single    Spouse name: Not on file   Number of children: Not on file   Years of education: Not on file   Highest education level: Not on file  Occupational History   Not on file  Tobacco Use   Smoking status: Former    Types: Cigarettes    Quit date: 02/05/2014    Years since quitting: 6.8   Smokeless tobacco: Never   Tobacco comments:    1 cig every 4-5 days ---weaning off  Substance and Sexual Activity   Alcohol use: Yes    Alcohol/week: 0.0 standard drinks   Drug use: No   Sexual activity: Not Currently    Partners: Male  Other Topics Concern    Not on file  Social History Narrative   Exercise --rare   Social Determinants of Health   Financial Resource Strain: Not on file  Food Insecurity: Not on file  Transportation Needs: Not on file  Physical Activity: Not on file  Stress: Not on file  Social Connections: Not on file  Intimate Partner Violence: Not on file    Outpatient Medications Prior to Visit  Medication Sig Dispense Refill   amLODipine (NORVASC) 5 MG tablet TAKE 1 TABLET ONCE DAILY. 90 tablet 0   atorvastatin (LIPITOR) 40 MG tablet TAKE ONE TABLET AT BEDTIME. 90 tablet 0   buPROPion (WELLBUTRIN XL) 300 MG 24 hr tablet TAKE 1 TABLET EACH DAY. 90 tablet 3   escitalopram (LEXAPRO) 20 MG tablet Take 1 tablet (20 mg total) by mouth daily. 30 tablet 2   ibuprofen (IBU) 600 MG tablet Take 1 tablet (600 mg total) by mouth 3 (three) times daily as needed. 30 tablet 0   lisinopril (ZESTRIL) 30 MG tablet TAKE 1 TABLET ONCE DAILY. 90 tablet 0   pantoprazole (PROTONIX) 40 MG tablet Take 1 tablet (40 mg total) by mouth daily. 30 tablet 3   rosuvastatin (CRESTOR) 40 MG tablet Take 1 tablet (40 mg  total) by mouth daily. 30 tablet 2   levocetirizine (XYZAL) 5 MG tablet TAKE 1 TABLET IN THE P.M. 90 tablet 0   azithromycin (ZITHROMAX Z-PAK) 250 MG tablet As directed (Patient not taking: Reported on 12/24/2020) 6 each 0   No facility-administered medications prior to visit.    Allergies  Allergen Reactions   Zoloft [Sertraline Hcl] Diarrhea and Other (See Comments)    Stomach upset     Review of Systems  Constitutional:  Positive for malaise/fatigue. Negative for fever.  HENT:  Positive for congestion.   Eyes:  Negative for blurred vision.  Respiratory:  Negative for shortness of breath.   Cardiovascular:  Negative for chest pain, palpitations and leg swelling.  Gastrointestinal:  Negative for abdominal pain, blood in stool and nausea.  Genitourinary:  Negative for dysuria and frequency.  Musculoskeletal:  Negative for  falls.  Skin:  Negative for rash.  Neurological:  Negative for dizziness, loss of consciousness and headaches.  Endo/Heme/Allergies:  Negative for environmental allergies.  Psychiatric/Behavioral:  Negative for depression. The patient is not nervous/anxious.       Objective:    Physical Exam Vitals and nursing note reviewed.  Constitutional:      General: She is not in acute distress.    Appearance: Normal appearance. She is not ill-appearing.  HENT:     Head: Normocephalic and atraumatic.     Right Ear: External ear normal.     Left Ear: External ear normal.  Eyes:     Extraocular Movements: Extraocular movements intact.     Pupils: Pupils are equal, round, and reactive to light.  Cardiovascular:     Rate and Rhythm: Normal rate and regular rhythm.     Pulses: Normal pulses.     Heart sounds: Normal heart sounds. No murmur heard.   No gallop.  Pulmonary:     Effort: Pulmonary effort is normal. No respiratory distress.     Breath sounds: Normal breath sounds. No wheezing, rhonchi or rales.  Skin:    General: Skin is warm and dry.  Neurological:     Mental Status: She is alert and oriented to person, place, and time.  Psychiatric:        Behavior: Behavior normal.    BP 114/74 (BP Location: Right Arm, Patient Position: Sitting, Cuff Size: Normal)   Pulse 94   Temp 98 F (36.7 C) (Oral)   Resp 18   Ht 5\' 4"  (1.626 m)   Wt 183 lb (83 kg)   SpO2 98%   BMI 31.41 kg/m  Wt Readings from Last 3 Encounters:  12/24/20 183 lb (83 kg)  02/06/20 185 lb 9.6 oz (84.2 kg)  12/25/19 190 lb 3.2 oz (86.3 kg)    Diabetic Foot Exam - Simple   No data filed    Lab Results  Component Value Date   WBC 6.2 01/10/2019   HGB 14.1 01/10/2019   HCT 42.3 01/10/2019   PLT 353.0 01/10/2019   GLUCOSE 86 12/25/2019   CHOL 175 12/25/2019   TRIG 72.0 12/25/2019   HDL 60.80 12/25/2019   LDLDIRECT 213.8 08/29/2012   LDLCALC 100 (H) 12/25/2019   ALT 24 12/25/2019   AST 19 12/25/2019    NA 140 12/25/2019   K 4.3 12/25/2019   CL 109 12/25/2019   CREATININE 0.98 12/25/2019   BUN 17 12/25/2019   CO2 26 12/25/2019   TSH 1.53 12/25/2019   HGBA1C 6.0 11/29/2014    Lab Results  Component Value Date  TSH 1.53 12/25/2019   Lab Results  Component Value Date   WBC 6.2 01/10/2019   HGB 14.1 01/10/2019   HCT 42.3 01/10/2019   MCV 91.6 01/10/2019   PLT 353.0 01/10/2019   Lab Results  Component Value Date   NA 140 12/25/2019   K 4.3 12/25/2019   CO2 26 12/25/2019   GLUCOSE 86 12/25/2019   BUN 17 12/25/2019   CREATININE 0.98 12/25/2019   BILITOT 0.8 12/25/2019   ALKPHOS 85 12/25/2019   AST 19 12/25/2019   ALT 24 12/25/2019   PROT 7.2 12/25/2019   ALBUMIN 4.5 12/25/2019   CALCIUM 10.3 12/25/2019   GFR 69.82 12/25/2019   Lab Results  Component Value Date   CHOL 175 12/25/2019   Lab Results  Component Value Date   HDL 60.80 12/25/2019   Lab Results  Component Value Date   LDLCALC 100 (H) 12/25/2019   Lab Results  Component Value Date   TRIG 72.0 12/25/2019   Lab Results  Component Value Date   CHOLHDL 3 12/25/2019   Lab Results  Component Value Date   HGBA1C 6.0 11/29/2014       Assessment & Plan:   Problem List Items Addressed This Visit       Unprioritized   Essential hypertension    Well controlled, no changes to meds. Encouraged heart healthy diet such as the DASH diet and exercise as tolerated.        Hyperlipidemia LDL goal <100    Encourage heart healthy diet such as MIND or DASH diet, increase exercise, avoid trans fats, simple carbohydrates and processed foods, consider a krill or fish or flaxseed oil cap daily.        Other Visit Diagnoses     Primary hypertension    -  Primary   Relevant Orders   Lipid panel   CBC with Differential/Platelet   Comprehensive metabolic panel   TSH   Vitamin B12   Hyperlipidemia, unspecified hyperlipidemia type       Relevant Orders   Lipid panel   CBC with Differential/Platelet    Comprehensive metabolic panel   TSH   Vitamin B12   Allergy, initial encounter       Relevant Medications   fluticasone (FLONASE) 50 MCG/ACT nasal spray   loratadine (CLARITIN) 10 MG tablet   Other fatigue       Relevant Orders   VITAMIN D 25 Hydroxy (Vit-D Deficiency, Fractures)        Meds ordered this encounter  Medications   fluticasone (FLONASE) 50 MCG/ACT nasal spray    Sig: Place 2 sprays into both nostrils daily.    Dispense:  16 g    Refill:  6   loratadine (CLARITIN) 10 MG tablet    Sig: Take 1 tablet (10 mg total) by mouth daily.    Dispense:  30 tablet    Refill:  11    I, Dr. Seabron Spates, DO, personally preformed the services described in this documentation.  All medical record entries made by the scribe were at my direction and in my presence.  I have reviewed the chart and discharge instructions (if applicable) and agree that the record reflects my personal performance and is accurate and complete. 12/24/2020   I,Shehryar Baig,acting as a Neurosurgeon for [employer], DO.,have documented all relevant documentation on the behalf of Donato Schultz, DO,as directed by  Donato Schultz, DO while in the presence of Donato Schultz, DO.  Ann Held, DO

## 2020-12-24 NOTE — Assessment & Plan Note (Signed)
Well controlled, no changes to meds. Encouraged heart healthy diet such as the DASH diet and exercise as tolerated.  °

## 2020-12-24 NOTE — Patient Instructions (Signed)
https://www.nhlbi.nih.gov/files/docs/public/heart/dash_brief.pdf">  DASH Eating Plan DASH stands for Dietary Approaches to Stop Hypertension. The DASH eating plan is a healthy eating plan that has been shown to: Reduce high blood pressure (hypertension). Reduce your risk for type 2 diabetes, heart disease, and stroke. Help with weight loss. What are tips for following this plan? Reading food labels Check food labels for the amount of salt (sodium) per serving. Choose foods with less than 5 percent of the Daily Value of sodium. Generally, foods with less than 300 milligrams (mg) of sodium per serving fit into this eating plan. To find whole grains, look for the word "whole" as the first word in the ingredient list. Shopping Buy products labeled as "low-sodium" or "no salt added." Buy fresh foods. Avoid canned foods and pre-made or frozen meals. Cooking Avoid adding salt when cooking. Use salt-free seasonings or herbs instead of table salt or sea salt. Check with your health care provider or pharmacist before using salt substitutes. Do not fry foods. Cook foods using healthy methods such as baking, boiling, grilling, roasting, and broiling instead. Cook with heart-healthy oils, such as olive, canola, avocado, soybean, or sunflower oil. Meal planning  Eat a balanced diet that includes: 4 or more servings of fruits and 4 or more servings of vegetables each day. Try to fill one-half of your plate with fruits and vegetables. 6-8 servings of whole grains each day. Less than 6 oz (170 g) of lean meat, poultry, or fish each day. A 3-oz (85-g) serving of meat is about the same size as a deck of cards. One egg equals 1 oz (28 g). 2-3 servings of low-fat dairy each day. One serving is 1 cup (237 mL). 1 serving of nuts, seeds, or beans 5 times each week. 2-3 servings of heart-healthy fats. Healthy fats called omega-3 fatty acids are found in foods such as walnuts, flaxseeds, fortified milks, and eggs.  These fats are also found in cold-water fish, such as sardines, salmon, and mackerel. Limit how much you eat of: Canned or prepackaged foods. Food that is high in trans fat, such as some fried foods. Food that is high in saturated fat, such as fatty meat. Desserts and other sweets, sugary drinks, and other foods with added sugar. Full-fat dairy products. Do not salt foods before eating. Do not eat more than 4 egg yolks a week. Try to eat at least 2 vegetarian meals a week. Eat more home-cooked food and less restaurant, buffet, and fast food.  Lifestyle When eating at a restaurant, ask that your food be prepared with less salt or no salt, if possible. If you drink alcohol: Limit how much you use to: 0-1 drink a day for women who are not pregnant. 0-2 drinks a day for men. Be aware of how much alcohol is in your drink. In the [place] one drink equals one 12 oz bottle of beer (355 mL), one 5 oz glass of wine (148 mL), or one 1 oz glass of hard liquor (44 mL). General information Avoid eating more than 2,300 mg of salt a day. If you have hypertension, you may need to reduce your sodium intake to 1,500 mg a day. Work with your health care provider to maintain a healthy body weight or to lose weight. Ask what an ideal weight is for you. Get at least 30 minutes of exercise that causes your heart to beat faster (aerobic exercise) most days of the week. Activities may include walking, swimming, or biking. Work with your health care provider   or dietitian to adjust your eating plan to your individual calorie needs. What foods should I eat? Fruits All fresh, dried, or frozen fruit. Canned fruit in natural juice (without addedsugar). Vegetables Fresh or frozen vegetables (raw, steamed, roasted, or grilled). Low-sodium or reduced-sodium tomato and vegetable juice. Low-sodium or reduced-sodium tomatosauce and tomato paste. Low-sodium or reduced-sodium canned vegetables. Grains Whole-grain or  whole-wheat bread. Whole-grain or whole-wheat pasta. Brown rice. Oatmeal. Quinoa. Bulgur. Whole-grain and low-sodium cereals. Pita bread.Low-fat, low-sodium crackers. Whole-wheat flour tortillas. Meats and other proteins Skinless chicken or turkey. Ground chicken or turkey. Pork with fat trimmed off. Fish and seafood. Egg whites. Dried beans, peas, or lentils. Unsalted nuts, nut butters, and seeds. Unsalted canned beans. Lean cuts of beef with fat trimmed off. Low-sodium, lean precooked or cured meat, such as sausages or meatloaves. Dairy Low-fat (1%) or fat-free (skim) milk. Reduced-fat, low-fat, or fat-free cheeses. Nonfat, low-sodium ricotta or cottage cheese. Low-fat or nonfatyogurt. Low-fat, low-sodium cheese. Fats and oils Soft margarine without trans fats. Vegetable oil. Reduced-fat, low-fat, or light mayonnaise and salad dressings (reduced-sodium). Canola, safflower, olive, avocado, soybean, andsunflower oils. Avocado. Seasonings and condiments Herbs. Spices. Seasoning mixes without salt. Other foods Unsalted popcorn and pretzels. Fat-free sweets. The items listed above may not be a complete list of foods and beverages you can eat. Contact a dietitian for more information. What foods should I avoid? Fruits Canned fruit in a light or heavy syrup. Fried fruit. Fruit in cream or buttersauce. Vegetables Creamed or fried vegetables. Vegetables in a cheese sauce. Regular canned vegetables (not low-sodium or reduced-sodium). Regular canned tomato sauce and paste (not low-sodium or reduced-sodium). Regular tomato and vegetable juice(not low-sodium or reduced-sodium). Pickles. Olives. Grains Baked goods made with fat, such as croissants, muffins, or some breads. Drypasta or rice meal packs. Meats and other proteins Fatty cuts of meat. Ribs. Fried meat. Bacon. Bologna, salami, and other precooked or cured meats, such as sausages or meat loaves. Fat from the back of a pig (fatback). Bratwurst.  Salted nuts and seeds. Canned beans with added salt. Canned orsmoked fish. Whole eggs or egg yolks. Chicken or turkey with skin. Dairy Whole or 2% milk, cream, and half-and-half. Whole or full-fat cream cheese. Whole-fat or sweetened yogurt. Full-fat cheese. Nondairy creamers. Whippedtoppings. Processed cheese and cheese spreads. Fats and oils Butter. Stick margarine. Lard. Shortening. Ghee. Bacon fat. Tropical oils, suchas coconut, palm kernel, or palm oil. Seasonings and condiments Onion salt, garlic salt, seasoned salt, table salt, and sea salt. Worcestershire sauce. Tartar sauce. Barbecue sauce. Teriyaki sauce. Soy sauce, including reduced-sodium. Steak sauce. Canned and packaged gravies. Fish sauce. Oyster sauce. Cocktail sauce. Store-bought horseradish. Ketchup. Mustard. Meat flavorings and tenderizers. Bouillon cubes. Hot sauces. Pre-made or packaged marinades. Pre-made or packaged taco seasonings. Relishes. Regular saladdressings. Other foods Salted popcorn and pretzels. The items listed above may not be a complete list of foods and beverages you should avoid. Contact a dietitian for more information. Where to find more information National Heart, Lung, and Blood Institute: www.nhlbi.nih.gov American Heart Association: www.heart.org Academy of Nutrition and Dietetics: www.eatright.org National Kidney Foundation: www.kidney.org Summary The DASH eating plan is a healthy eating plan that has been shown to reduce high blood pressure (hypertension). It may also reduce your risk for type 2 diabetes, heart disease, and stroke. When on the DASH eating plan, aim to eat more fresh fruits and vegetables, whole grains, lean proteins, low-fat dairy, and heart-healthy fats. With the DASH eating plan, you should limit salt (sodium) intake to 2,300   mg a day. If you have hypertension, you may need to reduce your sodium intake to 1,500 mg a day. Work with your health care provider or dietitian to adjust  your eating plan to your individual calorie needs. This information is not intended to replace advice given to you by your health care provider. Make sure you discuss any questions you have with your healthcare provider. Document Revised: 04/28/2019 Document Reviewed: 04/28/2019 Elsevier Patient Education  2022 Elsevier Inc.  

## 2020-12-24 NOTE — Assessment & Plan Note (Signed)
Encourage heart healthy diet such as MIND or DASH diet, increase exercise, avoid trans fats, simple carbohydrates and processed foods, consider a krill or fish or flaxseed oil cap daily.  °

## 2020-12-25 LAB — TSH: TSH: 1.95 u[IU]/mL (ref 0.35–5.50)

## 2020-12-25 LAB — COMPREHENSIVE METABOLIC PANEL
ALT: 189 U/L — ABNORMAL HIGH (ref 0–35)
AST: 109 U/L — ABNORMAL HIGH (ref 0–37)
Albumin: 4.4 g/dL (ref 3.5–5.2)
Alkaline Phosphatase: 92 U/L (ref 39–117)
BUN: 18 mg/dL (ref 6–23)
CO2: 23 mEq/L (ref 19–32)
Calcium: 10.3 mg/dL (ref 8.4–10.5)
Chloride: 104 mEq/L (ref 96–112)
Creatinine, Ser: 1.1 mg/dL (ref 0.40–1.20)
GFR: 54.06 mL/min — ABNORMAL LOW (ref >60.00)
Glucose, Bld: 82 mg/dL (ref 70–99)
Potassium: 4.4 mEq/L (ref 3.5–5.1)
Sodium: 137 mEq/L (ref 135–145)
Total Bilirubin: 0.7 mg/dL (ref 0.2–1.2)
Total Protein: 7.3 g/dL (ref 6.0–8.3)

## 2020-12-25 LAB — CBC WITH DIFFERENTIAL/PLATELET
Basophils Absolute: 0.1 10*3/uL (ref 0.0–0.1)
Basophils Relative: 1.2 % (ref 0.0–3.0)
Eosinophils Absolute: 0.1 10*3/uL (ref 0.0–0.7)
Eosinophils Relative: 1.9 % (ref 0.0–5.0)
HCT: 39.8 % (ref 36.0–46.0)
Hemoglobin: 13.6 g/dL (ref 12.0–15.0)
Lymphocytes Relative: 22.8 % (ref 12.0–46.0)
Lymphs Abs: 1.5 10*3/uL (ref 0.7–4.0)
MCHC: 34.1 g/dL (ref 30.0–36.0)
MCV: 91 fl (ref 78.0–100.0)
Monocytes Absolute: 0.7 10*3/uL (ref 0.1–1.0)
Monocytes Relative: 10 % (ref 3.0–12.0)
Neutro Abs: 4.3 10*3/uL (ref 1.4–7.7)
Neutrophils Relative %: 64.1 % (ref 43.0–77.0)
Platelets: 348 10*3/uL (ref 150.0–400.0)
RBC: 4.38 Mil/uL (ref 3.87–5.11)
RDW: 14 % (ref 11.5–15.5)
WBC: 6.7 10*3/uL (ref 4.0–10.5)

## 2020-12-25 LAB — LIPID PANEL
Cholesterol: 190 mg/dL (ref 0–200)
HDL: 47.1 mg/dL (ref >39.00)
LDL Cholesterol: 124 mg/dL — ABNORMAL HIGH (ref 0–99)
NonHDL: 142.53
Total CHOL/HDL Ratio: 4
Triglycerides: 92 mg/dL (ref 0.0–149.0)
VLDL: 18.4 mg/dL (ref 0.0–40.0)

## 2020-12-25 LAB — VITAMIN B12: Vitamin B-12: >1550 pg/mL — ABNORMAL HIGH (ref 211–911)

## 2020-12-28 DIAGNOSIS — Z20822 Contact with and (suspected) exposure to covid-19: Secondary | ICD-10-CM | POA: Diagnosis not present

## 2020-12-29 ENCOUNTER — Other Ambulatory Visit: Payer: Self-pay | Admitting: Family Medicine

## 2020-12-29 DIAGNOSIS — E785 Hyperlipidemia, unspecified: Secondary | ICD-10-CM

## 2021-01-02 ENCOUNTER — Telehealth: Payer: Self-pay | Admitting: Family Medicine

## 2021-01-02 MED ORDER — CEFDINIR 300 MG PO CAPS: 300.0000 mg | ORAL_CAPSULE | Freq: Two times a day (BID) | ORAL | 0 refills | Status: DC

## 2021-01-02 NOTE — Telephone Encounter (Signed)
Please see below: Pt was just seen.

## 2021-01-02 NOTE — Telephone Encounter (Signed)
Patient states as of Saturday her sinus sxs has returned. Headache cough nasal congestion. She states she wants a zpack called in. I did recommend she take covid test. But she states its sinus and wants zpack called in

## 2021-01-02 NOTE — Telephone Encounter (Signed)
Pt called. Gave her below information. Med sent in

## 2021-01-08 ENCOUNTER — Ambulatory Visit: Payer: Federal, State, Local not specified - PPO

## 2021-01-16 ENCOUNTER — Ambulatory Visit
Admission: RE | Admit: 2021-01-16 | Discharge: 2021-01-16 | Disposition: A | Discharge status: Home / Self Care | Payer: Federal, State, Local not specified - PPO | Source: Ambulatory Visit | Attending: Obstetrics and Gynecology | Admitting: Obstetrics and Gynecology

## 2021-01-16 ENCOUNTER — Other Ambulatory Visit: Payer: Self-pay

## 2021-01-16 DIAGNOSIS — Z1231 Encounter for screening mammogram for malignant neoplasm of breast: Secondary | ICD-10-CM

## 2021-01-16 IMAGING — MG MM DIGITAL SCREENING BILAT W/ TOMO AND CAD
6 of 10 series · 6 of 30 positions shown · non-contrast
Comparison: Previous exam(s).

ACR Breast Density Category a: The breast tissue is almost entirely
fatty.

CLINICAL DATA: Screening.

EXAM:
DIGITAL SCREENING BILATERAL MAMMOGRAM WITH TOMOSYNTHESIS AND CAD
TECHNIQUE: Bilateral screening digital craniocaudal and mediolateral oblique
mammograms were obtained. Bilateral screening digital breast
tomosynthesis was performed. The images were evaluated with
computer-aided detection.

[L CC synth-2D]
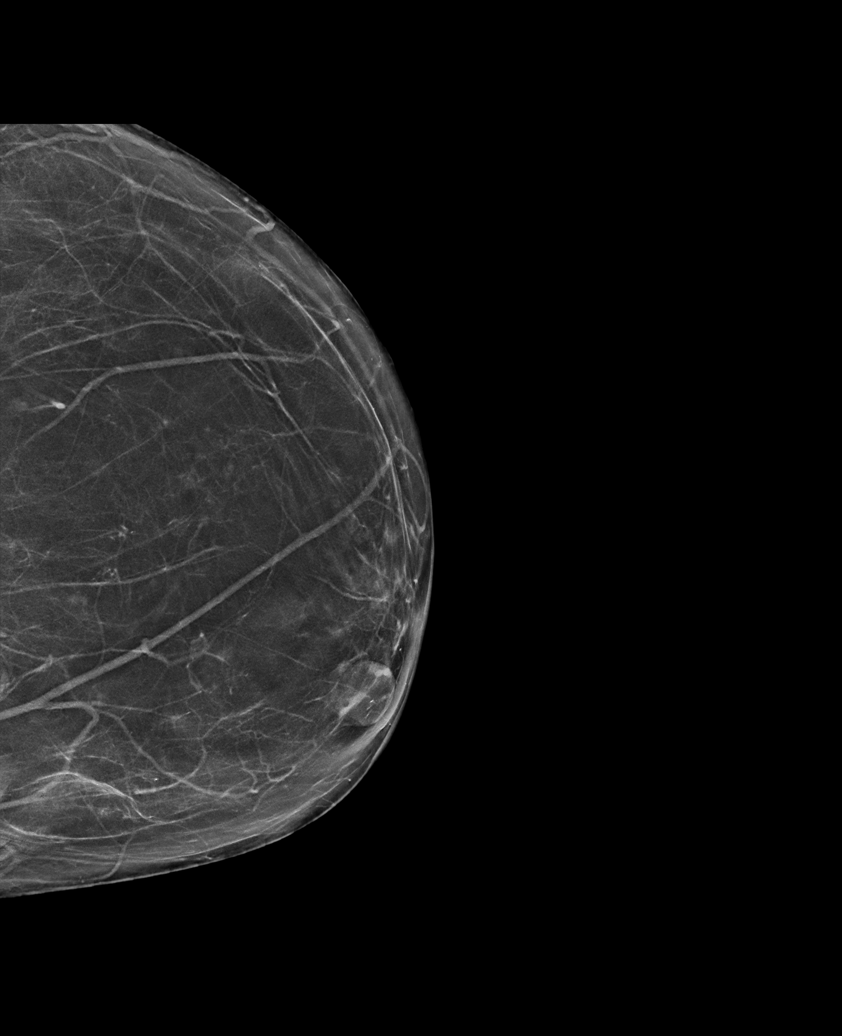

[R MLO synth-2D (1 of 2)]
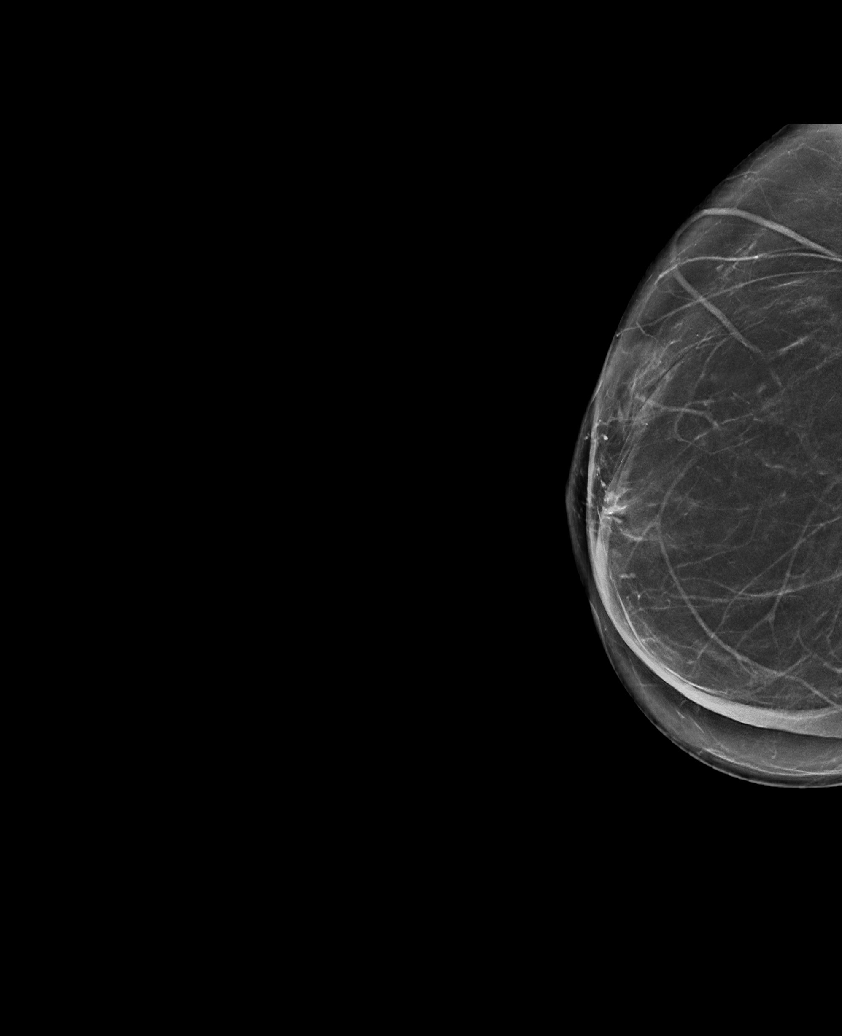

[R MLO synth-2D (2 of 2)]
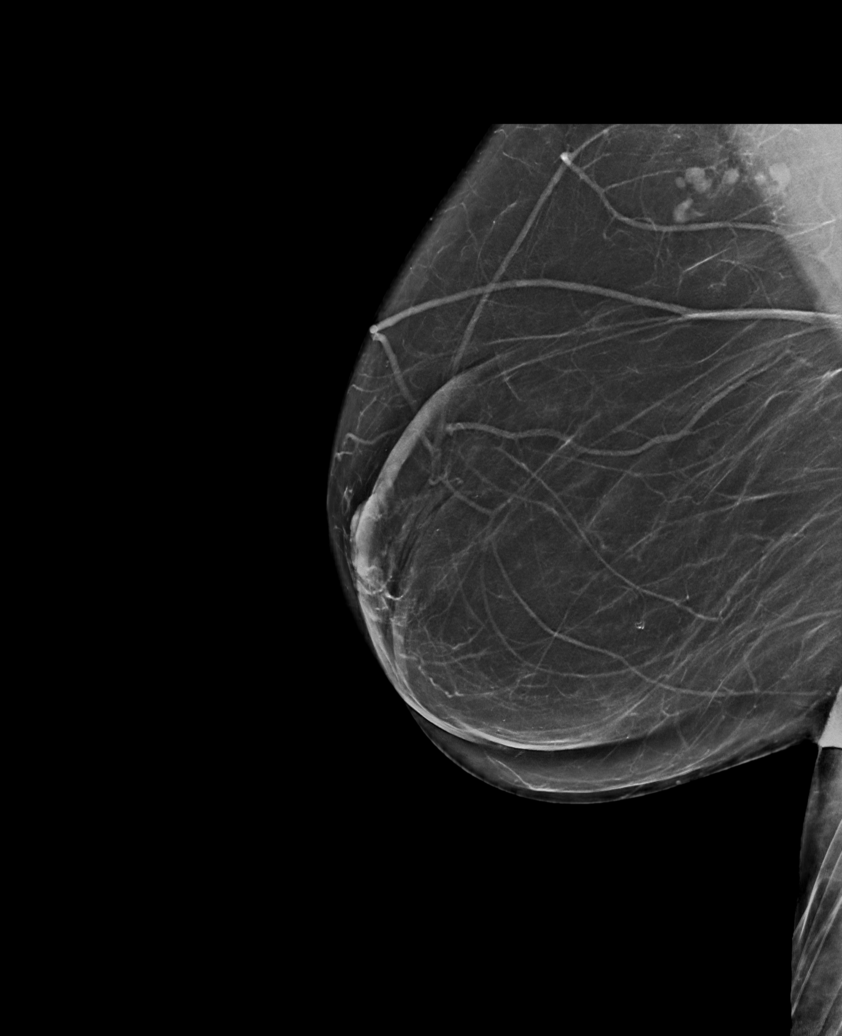

[L MLO synth-2D]
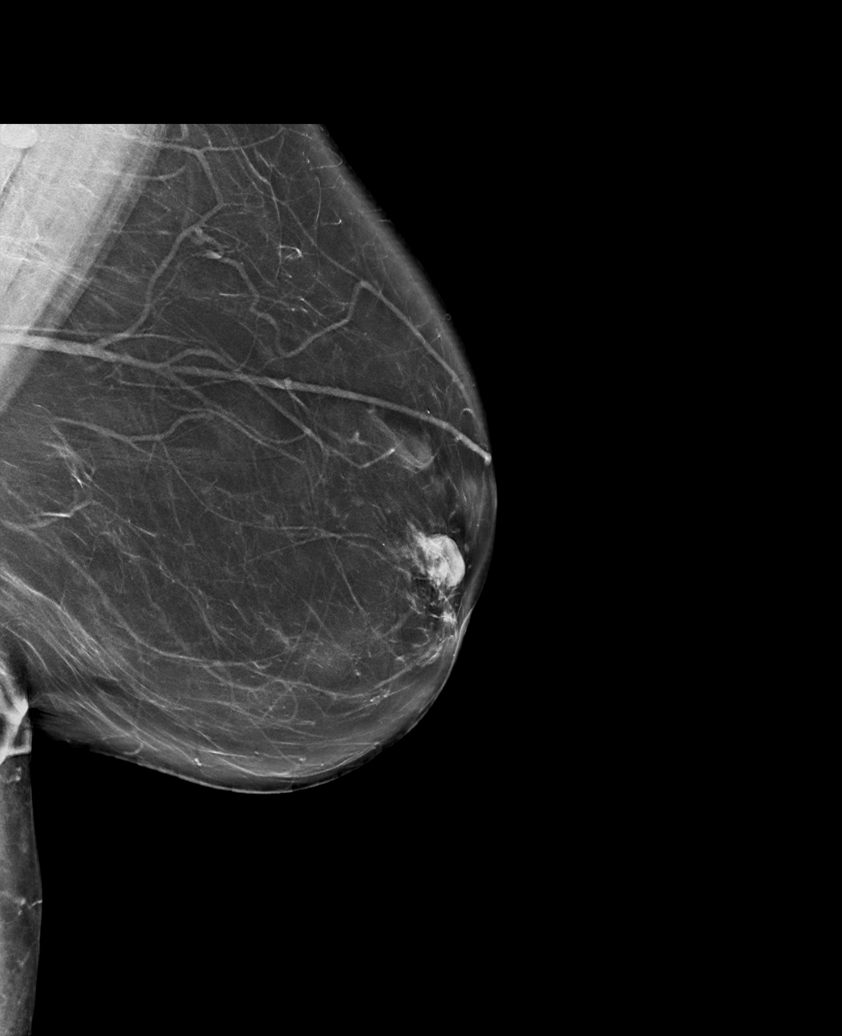

[R CC synth-2D]
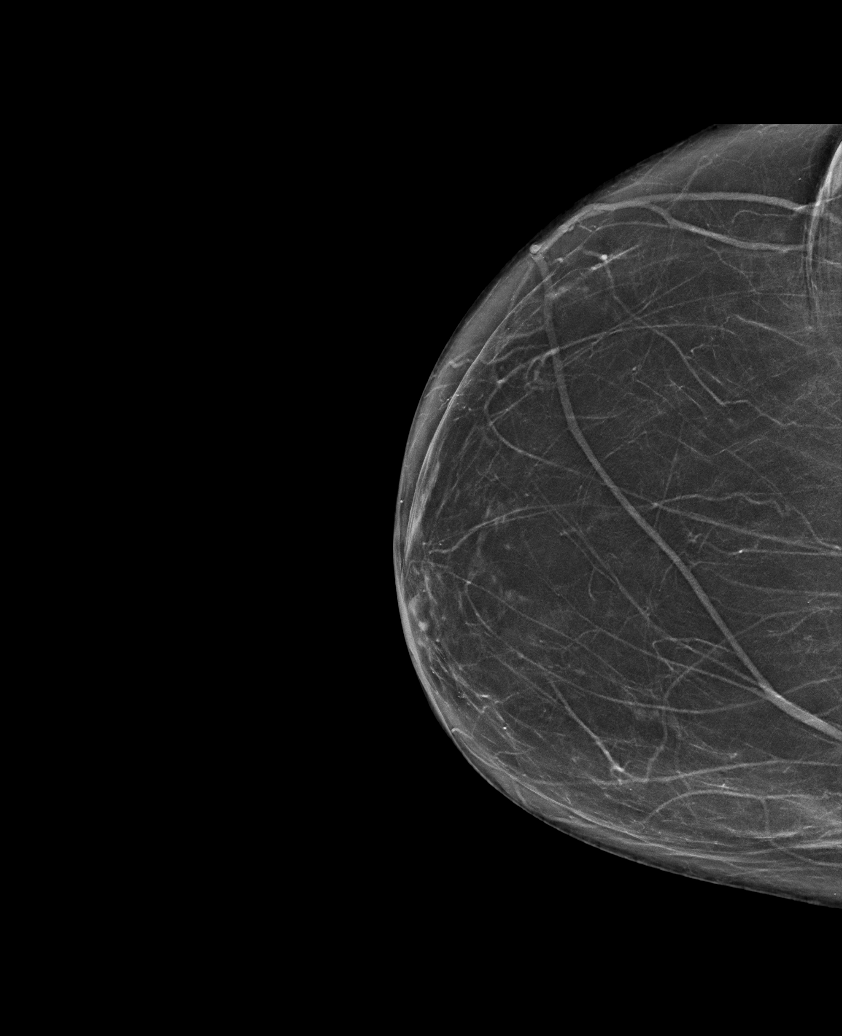

[R MLO tomo · tomo slice 39/77.0]
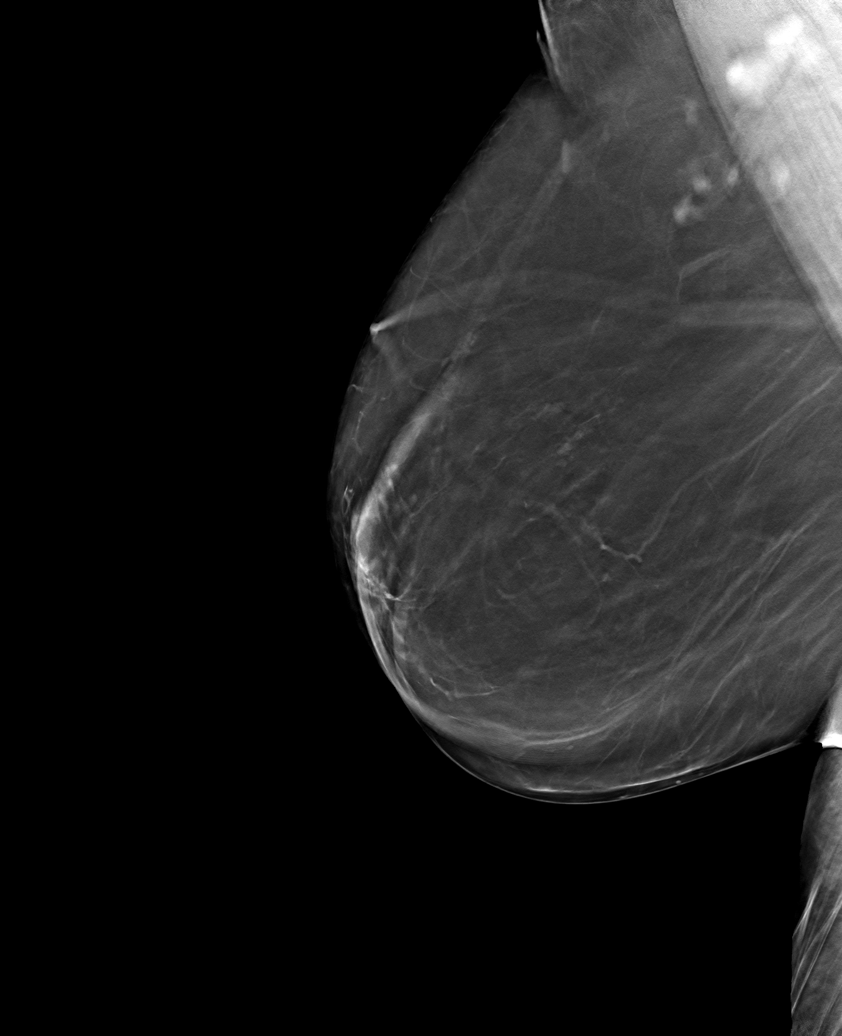

[6 of 30 positions shown; findings below may reference images not displayed]

FINDINGS: There are no findings suspicious for malignancy.
IMPRESSION: No mammographic evidence of malignancy. A result letter of this
screening mammogram will be mailed directly to the patient.

RECOMMENDATION:
Screening mammogram in one year. (Code:[8U])

BI-RADS CATEGORY  1: Negative.

## 2021-01-17 ENCOUNTER — Encounter: Payer: Self-pay | Admitting: Family Medicine

## 2021-01-17 ENCOUNTER — Ambulatory Visit: Payer: Federal, State, Local not specified - PPO | Admitting: Family Medicine

## 2021-01-17 ENCOUNTER — Other Ambulatory Visit: Payer: Self-pay

## 2021-01-17 VITALS — BP 100/80 | HR 113 | Temp 98.3°F | Resp 18 | Ht 64.0 in | Wt 178.6 lb

## 2021-01-17 DIAGNOSIS — J40 Bronchitis, not specified as acute or chronic: Secondary | ICD-10-CM | POA: Diagnosis not present

## 2021-01-17 MED ORDER — METHYLPREDNISOLONE ACETATE 80 MG/ML IJ SUSP
80.0000 mg | Freq: Once | INTRAMUSCULAR | Status: AC
  Administered 2021-01-17: 80 mg via INTRAMUSCULAR

## 2021-01-17 MED ORDER — PROMETHAZINE-DM 6.25-15 MG/5ML PO SYRP
5.0000 mL | ORAL_SOLUTION | Freq: Four times a day (QID) | ORAL | 0 refills | Status: DC | PRN
Start: 2021-01-17 — End: 2021-04-08

## 2021-01-17 MED ORDER — AZITHROMYCIN 250 MG PO TABS: ORAL_TABLET | ORAL | 0 refills | Status: DC

## 2021-01-17 MED ORDER — PREDNISONE 10 MG PO TABS
ORAL_TABLET | ORAL | 0 refills | Status: DC
Start: 2021-01-17 — End: 2021-04-08

## 2021-01-17 NOTE — Patient Instructions (Signed)
Acute Bronchitis, Adult  Acute bronchitis is sudden or acute swelling of the air tubes (bronchi) in the lungs. Acute bronchitis causes these tubes to fill with mucus, whichcan make it hard to breathe. It can also cause coughing or wheezing. In adults, acute bronchitis usually goes away within 2 weeks. A cough caused by bronchitis may last up to 3 weeks. Smoking, allergies, and asthma can make thecondition worse. What are the causes? This condition can be caused by germs and by substances that irritate the lungs, including: Cold and flu viruses. The most common cause of this condition is the virus that causes the common cold. Bacteria. Substances that irritate the lungs, including: Smoke from cigarettes and other forms of tobacco. Dust and pollen. Fumes from chemical products, gases, or burned fuel. Other materials that pollute indoor or outdoor air. Close contact with someone who has acute bronchitis. What increases the risk? The following factors may make you more likely to develop this condition: A weak body's defense system, also called the immune system. A condition that affects your lungs and breathing, such as asthma. What are the signs or symptoms? Common symptoms of this condition include: Lung and breathing problems, such as: Coughing. This may bring up clear, yellow, or green mucus from your lungs (sputum). Wheezing. Having too much mucus in your lungs (chest congestion). Having shortness of breath. A fever. Chills. Aches and pains, including: Tightness in your chest and other body aches. A sore throat. How is this diagnosed? This condition is usually diagnosed based on: Your symptoms and medical history. A physical exam. You may also have other tests, including tests to rule out other conditions, such as pneumonia. These tests include: A test of lung function. Test of a mucus sample to look for the presence of bacteria. Tests to check the oxygen level in your  blood. Blood tests. Chest X-ray. How is this treated? Most cases of acute bronchitis clear up over time without treatment. Your health care provider may recommend: Drinking more fluids. This can thin your mucus, which may improve your breathing. Using a device that gets medicine into your lungs (inhaler) to help improve breathing and control coughing. Using a vaporizer or a humidifier. These are machines that add water to the air to help you breathe better. Taking a medicine for a fever. Taking a medicine that thins mucus and clears congestion (expectorant). Taking a medicine that prevents or stops coughing (cough suppressant). Follow these instructions at home: Activity Get plenty of rest. Return to your normal activities as told by your health care provider. Ask your health care provider what activities are safe for you. Lifestyle  Drink enough fluid to keep your urine pale yellow. Do not drink alcohol. Do not use any products that contain nicotine or tobacco, such as cigarettes, e-cigarettes, and chewing tobacco. If you need help quitting, ask your health care provider. Be aware that: Your bronchitis will get worse if you smoke or breathe in other people's smoke (secondhand smoke). Your lungs will heal faster if you quit smoking.  General instructions Take over-the-counter and prescription medicines only as told by your health care provider. Use an inhaler, vaporizer, or humidifier as told by your health care provider. If you have a sore throat, gargle with a salt-water mixture 3-4 times a day or as needed. To make a salt-water mixture, completely dissolve -1 tsp (3-6 g) of salt in 1 cup (237 mL) of warm water. Take two teaspoons of honey at bedtime to lessen coughing at night.   Keep all follow-up visits as told by your health care provider. This is important. How is this prevented? To lower your risk of getting this condition again: Wash your hands often with soap and water. If  soap and water are not available, use hand sanitizer. Avoid contact with people who have cold symptoms. Try not to touch your mouth, nose, or eyes with your hands. Avoid places where there are fumes from chemicals. Breathing these fumes will make your condition worse. Get the flu shot every year. Contact a health care provider if: Your symptoms do not improve after 2 weeks of treatment. You vomit more than once or twice. You have symptoms of dehydration such as: Dark urine. Dry skin or eyes. Increased thirst. Headaches. Confusion. Muscle cramps. Get help right away if you: Cough up blood. Feel pain in your chest. Have severe shortness of breath. Faint or keep feeling like you are going to faint. Have a severe headache. Have fever or chills that get worse. These symptoms may represent a serious problem that is an emergency. Do not wait to see if the symptoms will go away. Get medical help right away. Call your local emergency services (911 in the [place] Do not drive yourself to the hospital. Summary Acute bronchitis is sudden (acute) inflammation of the air tubes (bronchi) between the windpipe and the lungs. In adults, acute bronchitis usually goes away within 2 weeks, although coughing may last 3 weeks or longer. Take over-the-counter and prescription medicines only as told by your health care provider. Drink enough fluid to keep your urine pale yellow. Contact a health care provider if your symptoms do not improve after 2 weeks of treatment. Get help right away if you cough up blood, faint, or have chest pain or shortness of breath. This information is not intended to replace advice given to you by your health care provider. Make sure you discuss any questions you have with your healthcare provider. Document Revised: 04/24/2020 Document Reviewed: 12/16/2018 Elsevier Patient Education  2022 [employer]  

## 2021-01-17 NOTE — Assessment & Plan Note (Signed)
Depo medrol IM pred taper  abx per orders  Cough med per orders

## 2021-01-17 NOTE — Progress Notes (Signed)
Subjective:   By signing my name below, I, Carrie [name], attest that this documentation has been prepared under the direction and in the presence of  Donato Schultz, DO. 01/17/2021    Patient ID: Carrie [name], female    DOB: 1960/11/18, 60 y.o.   MRN: [id number]  Chief Complaint  Patient presents with   Sore Throat    Pt states still having symptoms. No new sxs.     HPI Patient is in today for an office visit.  She is still experiencing productive coughing, pressure and tightness in her chest. She mentions that the sinus pressure has reduced. She has been managing the symptoms with 10 mg Claritin PO daily and mentions it brings little relief. She has also been feeling dizzy recently.   She has a rash on her face and her face looks swollen. She also mentions having left ear pain.  She mentions she has been having a fever at home and is treating with tylenol and ibuprofen. She has also lost her appetite because she spits up everything she eats.  She has been managing her blood pressure with 30 mg lisinopril and 5 mg amlodipine. She keeps a blood pressure log at home and mentions it is normally 110/79. BP Readings from Last 3 Encounters:  01/17/21 100/80  12/24/20 114/74  02/29/20 (!) 136/95     Past Medical History:  Diagnosis Date   Hyperlipidemia    Hypertension     Past Surgical History:  Procedure Laterality Date   ABDOMINAL HYSTERECTOMY     REDUCTION MAMMAPLASTY      Family History  Problem Relation Age of Onset   Breast cancer Mother        deceased by 3   Breast cancer Sister 57   Diabetes Sister    Breast cancer Other    Coronary artery disease Other    Hypertension Other     Social History   Socioeconomic History   Marital status: Single    Spouse name: Not on file   Number of children: Not on file   Years of education: Not on file   Highest education level: Not on file  Occupational History   Not on file  Tobacco Use   Smoking status:  Former    Types: Cigarettes    Quit date: 02/05/2014    Years since quitting: 6.9   Smokeless tobacco: Never   Tobacco comments:    1 cig every 4-5 days ---weaning off  Substance and Sexual Activity   Alcohol use: Yes    Alcohol/week: 0.0 standard drinks   Drug use: No   Sexual activity: Not Currently    Partners: Male  Other Topics Concern   Not on file  Social History Narrative   Exercise --rare   Social Determinants of Health   Financial Resource Strain: Not on file  Food Insecurity: Not on file  Transportation Needs: Not on file  Physical Activity: Not on file  Stress: Not on file  Social Connections: Not on file  Intimate Partner Violence: Not on file    Outpatient Medications Prior to Visit  Medication Sig Dispense Refill   amLODipine (NORVASC) 5 MG tablet TAKE 1 TABLET ONCE DAILY. 90 tablet 0   atorvastatin (LIPITOR) 40 MG tablet TAKE ONE TABLET AT BEDTIME. 90 tablet 0   buPROPion (WELLBUTRIN XL) 300 MG 24 hr tablet TAKE 1 TABLET EACH DAY. 90 tablet 3   escitalopram (LEXAPRO) 20 MG tablet Take 1 tablet (20  mg total) by mouth daily. 30 tablet 2   fluticasone (FLONASE) 50 MCG/ACT nasal spray Place 2 sprays into both nostrils daily. 16 g 6   ibuprofen (IBU) 600 MG tablet Take 1 tablet (600 mg total) by mouth 3 (three) times daily as needed. 30 tablet 0   lisinopril (ZESTRIL) 30 MG tablet TAKE 1 TABLET ONCE DAILY. 90 tablet 0   loratadine (CLARITIN) 10 MG tablet Take 1 tablet (10 mg total) by mouth daily. 30 tablet 11   pantoprazole (PROTONIX) 40 MG tablet Take 1 tablet (40 mg total) by mouth daily. 30 tablet 3   rosuvastatin (CRESTOR) 40 MG tablet Take 1 tablet (40 mg total) by mouth daily. 30 tablet 2   cefdinir (OMNICEF) 300 MG capsule Take 1 capsule (300 mg total) by mouth 2 (two) times daily. (Patient not taking: Reported on 01/17/2021) 20 capsule 0   No facility-administered medications prior to visit.    Allergies  Allergen Reactions   Zoloft [Sertraline Hcl]  Diarrhea and Other (See Comments)    Stomach upset     Review of Systems  HENT:  Positive for ear pain (left) and sore throat.   Respiratory:  Positive for cough and wheezing.   Cardiovascular:        (+) Tightness in chest  Neurological:  Positive for dizziness.      Objective:    Physical Exam Constitutional:      General: She is not in acute distress.    Appearance: Normal appearance. She is not ill-appearing.  HENT:     Head: Normocephalic and atraumatic.     Right Ear: Tympanic membrane, ear canal and external ear normal.     Left Ear: Tympanic membrane, ear canal and external ear normal.  Eyes:     Extraocular Movements: Extraocular movements intact.     Pupils: Pupils are equal, round, and reactive to light.  Cardiovascular:     Rate and Rhythm: Normal rate and regular rhythm.     Pulses: Normal pulses.     Heart sounds: Normal heart sounds. No murmur heard.   No gallop.  Pulmonary:     Effort: Pulmonary effort is normal. No respiratory distress.     Breath sounds: Decreased breath sounds and wheezing present. No rhonchi or rales.  Abdominal:     General: Bowel sounds are normal. There is no distension.     Palpations: Abdomen is soft. There is no mass.     Tenderness: There is no abdominal tenderness. There is no guarding or rebound.     Hernia: No hernia is present.  Musculoskeletal:     Cervical back: Normal range of motion and neck supple.  Lymphadenopathy:     Cervical: No cervical adenopathy.  Skin:    General: Skin is warm and dry.  Neurological:     Mental Status: She is alert and oriented to person, place, and time.  Psychiatric:        Behavior: Behavior normal.    BP 100/80 (BP Location: Left Arm, Patient Position: Sitting, Cuff Size: Normal)   Pulse (!) 113   Temp 98.3 F (36.8 C) (Oral)   Resp 18   Ht 5\' 4"  (1.626 m)   Wt 178 lb 9.6 oz (81 kg)   SpO2 96%   BMI 30.66 kg/m  Wt Readings from Last 3 Encounters:  01/17/21 178 lb 9.6 oz (81  kg)  12/24/20 183 lb (83 kg)  02/06/20 185 lb 9.6 oz (84.2 kg)    Diabetic  Foot Exam - Simple   No data filed    Lab Results  Component Value Date   WBC 6.7 12/24/2020   HGB 13.6 12/24/2020   HCT 39.8 12/24/2020   PLT 348.0 12/24/2020   GLUCOSE 82 12/24/2020   CHOL 190 12/24/2020   TRIG 92.0 12/24/2020   HDL 47.10 12/24/2020   LDLDIRECT 213.8 08/29/2012   LDLCALC 124 (H) 12/24/2020   ALT 189 (H) 12/24/2020   AST 109 (H) 12/24/2020   NA 137 12/24/2020   K 4.4 12/24/2020   CL 104 12/24/2020   CREATININE 1.10 12/24/2020   BUN 18 12/24/2020   CO2 23 12/24/2020   TSH 1.95 12/24/2020   HGBA1C 6.0 11/29/2014    Lab Results  Component Value Date   TSH 1.95 12/24/2020   Lab Results  Component Value Date   WBC 6.7 12/24/2020   HGB 13.6 12/24/2020   HCT 39.8 12/24/2020   MCV 91.0 12/24/2020   PLT 348.0 12/24/2020   Lab Results  Component Value Date   NA 137 12/24/2020   K 4.4 12/24/2020   CO2 23 12/24/2020   GLUCOSE 82 12/24/2020   BUN 18 12/24/2020   CREATININE 1.10 12/24/2020   BILITOT 0.7 12/24/2020   ALKPHOS 92 12/24/2020   AST 109 (H) 12/24/2020   ALT 189 (H) 12/24/2020   PROT 7.3 12/24/2020   ALBUMIN 4.4 12/24/2020   CALCIUM 10.3 12/24/2020   GFR 54.06 (L) 12/24/2020   Lab Results  Component Value Date   CHOL 190 12/24/2020   Lab Results  Component Value Date   HDL 47.10 12/24/2020   Lab Results  Component Value Date   LDLCALC 124 (H) 12/24/2020   Lab Results  Component Value Date   TRIG 92.0 12/24/2020   Lab Results  Component Value Date   CHOLHDL 4 12/24/2020   Lab Results  Component Value Date   HGBA1C 6.0 11/29/2014       Assessment & Plan:   Problem List Items Addressed This Visit       Unprioritized   Bronchitis - Primary    Depo medrol IM pred taper  abx per orders  Cough med per orders       Relevant Medications   azithromycin (ZITHROMAX Z-PAK) 250 MG tablet   promethazine-dextromethorphan (PROMETHAZINE-DM)  6.25-15 MG/5ML syrup   predniSONE (DELTASONE) 10 MG tablet    Meds ordered this encounter  Medications   azithromycin (ZITHROMAX Z-PAK) 250 MG tablet    Sig: As directed    Dispense:  6 each    Refill:  0   promethazine-dextromethorphan (PROMETHAZINE-DM) 6.25-15 MG/5ML syrup    Sig: Take 5 mLs by mouth 4 (four) times daily as needed.    Dispense:  118 mL    Refill:  0   predniSONE (DELTASONE) 10 MG tablet    Sig: TAKE 3 TABLETS PO QD FOR 3 DAYS THEN TAKE 2 TABLETS PO QD FOR 3 DAYS THEN TAKE 1 TABLET PO QD FOR 3 DAYS THEN TAKE 1/2 TAB PO QD FOR 3 DAYS    Dispense:  20 tablet    Refill:  0   methylPREDNISolone acetate (DEPO-MEDROL) injection 80 mg    I,Carrie [name],acting as a Neurosurgeon for [employer], DO.,have documented all relevant documentation on the behalf of Donato Schultz, DO,as directed by  Donato Schultz, DO while in the presence of Donato Schultz, DO.   I, Donato Schultz, DO., personally preformed the services  described in this documentation.  All medical record entries made by the scribe were at my direction and in my presence.  I have reviewed the chart and discharge instructions (if applicable) and agree that the record reflects my personal performance and is accurate and complete. 01/17/2021

## 2021-02-20 ENCOUNTER — Telehealth: Payer: Self-pay | Admitting: Family Medicine

## 2021-02-20 NOTE — Telephone Encounter (Signed)
Pt called and stated she needs colonoscopy results faxed over to [facility] before she can schedule an appointment.   [facility] fax: [phone]

## 2021-02-20 NOTE — Telephone Encounter (Signed)
Spoke with patient. Advised patient to contact previous Gi for colonoscopy results

## 2021-02-25 ENCOUNTER — Other Ambulatory Visit: Payer: Self-pay | Admitting: Family Medicine

## 2021-02-25 ENCOUNTER — Telehealth: Payer: Self-pay

## 2021-02-25 DIAGNOSIS — M542 Cervicalgia: Secondary | ICD-10-CM | POA: Diagnosis not present

## 2021-02-25 DIAGNOSIS — M25551 Pain in right hip: Secondary | ICD-10-CM | POA: Diagnosis not present

## 2021-02-25 DIAGNOSIS — M25552 Pain in left hip: Secondary | ICD-10-CM | POA: Diagnosis not present

## 2021-02-25 DIAGNOSIS — F320 Major depressive disorder, single episode, mild: Secondary | ICD-10-CM

## 2021-02-25 NOTE — Telephone Encounter (Signed)
Approved today  Your PA request has been approved. Additional information will be provided in the approval communication. (Message [id number])

## 2021-02-26 NOTE — Telephone Encounter (Signed)
Approved from 01/26/21-02/25/22

## 2021-03-26 ENCOUNTER — Other Ambulatory Visit: Payer: Self-pay

## 2021-03-26 ENCOUNTER — Inpatient Hospital Stay (HOSPITAL_COMMUNITY)
Admission: EM | Admit: 2021-03-26 | Discharge: 2021-04-08 | DRG: 545 | Disposition: A | Discharge status: Rehabilitation Facility | Payer: Federal, State, Local not specified - PPO | Attending: Family Medicine | Admitting: Family Medicine

## 2021-03-26 ENCOUNTER — Emergency Department (HOSPITAL_COMMUNITY): Payer: Federal, State, Local not specified - PPO

## 2021-03-26 ENCOUNTER — Encounter (HOSPITAL_COMMUNITY): Payer: Self-pay | Admitting: Oncology

## 2021-03-26 DIAGNOSIS — Z888 Allergy status to other drugs, medicaments and biological substances status: Secondary | ICD-10-CM

## 2021-03-26 DIAGNOSIS — R5381 Other malaise: Secondary | ICD-10-CM | POA: Diagnosis not present

## 2021-03-26 DIAGNOSIS — M25552 Pain in left hip: Secondary | ICD-10-CM

## 2021-03-26 DIAGNOSIS — F321 Major depressive disorder, single episode, moderate: Secondary | ICD-10-CM | POA: Diagnosis not present

## 2021-03-26 DIAGNOSIS — E860 Dehydration: Secondary | ICD-10-CM | POA: Diagnosis present

## 2021-03-26 DIAGNOSIS — Z87891 Personal history of nicotine dependence: Secondary | ICD-10-CM

## 2021-03-26 DIAGNOSIS — R519 Headache, unspecified: Secondary | ICD-10-CM | POA: Diagnosis not present

## 2021-03-26 DIAGNOSIS — Z043 Encounter for examination and observation following other accident: Secondary | ICD-10-CM | POA: Diagnosis not present

## 2021-03-26 DIAGNOSIS — R531 Weakness: Secondary | ICD-10-CM | POA: Diagnosis not present

## 2021-03-26 DIAGNOSIS — W1830XA Fall on same level, unspecified, initial encounter: Secondary | ICD-10-CM | POA: Diagnosis present

## 2021-03-26 DIAGNOSIS — Z23 Encounter for immunization: Secondary | ICD-10-CM | POA: Diagnosis not present

## 2021-03-26 DIAGNOSIS — I3139 Other pericardial effusion (noninflammatory): Secondary | ICD-10-CM | POA: Diagnosis not present

## 2021-03-26 DIAGNOSIS — E669 Obesity, unspecified: Secondary | ICD-10-CM | POA: Diagnosis not present

## 2021-03-26 DIAGNOSIS — Z833 Family history of diabetes mellitus: Secondary | ICD-10-CM

## 2021-03-26 DIAGNOSIS — R532 Functional quadriplegia: Secondary | ICD-10-CM | POA: Diagnosis not present

## 2021-03-26 DIAGNOSIS — Z90710 Acquired absence of both cervix and uterus: Secondary | ICD-10-CM

## 2021-03-26 DIAGNOSIS — K760 Fatty (change of) liver, not elsewhere classified: Secondary | ICD-10-CM | POA: Diagnosis not present

## 2021-03-26 DIAGNOSIS — R7989 Other specified abnormal findings of blood chemistry: Secondary | ICD-10-CM

## 2021-03-26 DIAGNOSIS — Z6830 Body mass index (BMI) 30.0-30.9, adult: Secondary | ICD-10-CM | POA: Diagnosis not present

## 2021-03-26 DIAGNOSIS — M2550 Pain in unspecified joint: Secondary | ICD-10-CM | POA: Diagnosis not present

## 2021-03-26 DIAGNOSIS — Z8249 Family history of ischemic heart disease and other diseases of the circulatory system: Secondary | ICD-10-CM

## 2021-03-26 DIAGNOSIS — I959 Hypotension, unspecified: Secondary | ICD-10-CM | POA: Diagnosis not present

## 2021-03-26 DIAGNOSIS — Z20822 Contact with and (suspected) exposure to covid-19: Secondary | ICD-10-CM | POA: Diagnosis not present

## 2021-03-26 DIAGNOSIS — M069 Rheumatoid arthritis, unspecified: Principal | ICD-10-CM | POA: Diagnosis present

## 2021-03-26 DIAGNOSIS — M6282 Rhabdomyolysis: Secondary | ICD-10-CM | POA: Diagnosis not present

## 2021-03-26 DIAGNOSIS — E876 Hypokalemia: Secondary | ICD-10-CM | POA: Diagnosis present

## 2021-03-26 DIAGNOSIS — R55 Syncope and collapse: Secondary | ICD-10-CM | POA: Diagnosis present

## 2021-03-26 DIAGNOSIS — E785 Hyperlipidemia, unspecified: Secondary | ICD-10-CM | POA: Diagnosis present

## 2021-03-26 DIAGNOSIS — R079 Chest pain, unspecified: Secondary | ICD-10-CM | POA: Diagnosis not present

## 2021-03-26 DIAGNOSIS — R21 Rash and other nonspecific skin eruption: Secondary | ICD-10-CM | POA: Diagnosis present

## 2021-03-26 DIAGNOSIS — I10 Essential (primary) hypertension: Secondary | ICD-10-CM | POA: Diagnosis not present

## 2021-03-26 DIAGNOSIS — R000 Tachycardia, unspecified: Secondary | ICD-10-CM

## 2021-03-26 DIAGNOSIS — G3184 Mild cognitive impairment, so stated: Secondary | ICD-10-CM | POA: Diagnosis present

## 2021-03-26 DIAGNOSIS — Z79899 Other long term (current) drug therapy: Secondary | ICD-10-CM

## 2021-03-26 HISTORY — DX: Syncope and collapse: R55

## 2021-03-26 LAB — CBC WITH DIFFERENTIAL/PLATELET
Abs Immature Granulocytes: 0.02 10*3/uL (ref 0.00–0.07)
Basophils Absolute: 0 10*3/uL (ref 0.0–0.1)
Basophils Relative: 0 %
Eosinophils Absolute: 0 10*3/uL (ref 0.0–0.5)
Eosinophils Relative: 0 %
HCT: 42.6 % (ref 36.0–46.0)
Hemoglobin: 14.5 g/dL (ref 12.0–15.0)
Immature Granulocytes: 0 %
Lymphocytes Relative: 7 %
Lymphs Abs: 0.4 10*3/uL — ABNORMAL LOW (ref 0.7–4.0)
MCH: 31.2 pg (ref 26.0–34.0)
MCHC: 34 g/dL (ref 30.0–36.0)
MCV: 91.6 fL (ref 80.0–100.0)
Monocytes Absolute: 0.3 10*3/uL (ref 0.1–1.0)
Monocytes Relative: 4 %
Neutro Abs: 5.7 10*3/uL (ref 1.7–7.7)
Neutrophils Relative %: 89 %
Platelets: 253 10*3/uL (ref 150–400)
RBC: 4.65 MIL/uL (ref 3.87–5.11)
RDW: 14.9 % (ref 11.5–15.5)
WBC: 6.4 10*3/uL (ref 4.0–10.5)
nRBC: 0 % (ref 0.0–0.2)

## 2021-03-26 LAB — URINALYSIS, ROUTINE W REFLEX MICROSCOPIC
Bilirubin Urine: NEGATIVE
Glucose, UA: NEGATIVE mg/dL
Ketones, ur: 20 mg/dL — AB
Leukocytes,Ua: NEGATIVE
Nitrite: NEGATIVE
Protein, ur: NEGATIVE mg/dL
Specific Gravity, Urine: 1.016 (ref 1.005–1.030)
pH: 5 (ref 5.0–8.0)

## 2021-03-26 LAB — VITAMIN B12: Vitamin B-12: 7158 pg/mL — ABNORMAL HIGH (ref 180–914)

## 2021-03-26 LAB — ETHANOL: Alcohol, Ethyl (B): <10 mg/dL (ref <10)

## 2021-03-26 LAB — RAPID URINE DRUG SCREEN, HOSP PERFORMED
Amphetamines: NOT DETECTED
Barbiturates: NOT DETECTED
Benzodiazepines: NOT DETECTED
Cocaine: NOT DETECTED
Opiates: NOT DETECTED
Tetrahydrocannabinol: NOT DETECTED

## 2021-03-26 LAB — COMPREHENSIVE METABOLIC PANEL
ALT: 114 U/L — ABNORMAL HIGH (ref 0–44)
AST: 161 U/L — ABNORMAL HIGH (ref 15–41)
Albumin: 3.3 g/dL — ABNORMAL LOW (ref 3.5–5.0)
Alkaline Phosphatase: 80 U/L (ref 38–126)
Anion gap: 8 (ref 5–15)
BUN: 20 mg/dL (ref 8–23)
CO2: 18 mmol/L — ABNORMAL LOW (ref 22–32)
Calcium: 9.3 mg/dL (ref 8.9–10.3)
Chloride: 111 mmol/L (ref 98–111)
Creatinine, Ser: 1.03 mg/dL — ABNORMAL HIGH (ref 0.44–1.00)
GFR, Estimated: >60 mL/min (ref >60)
Glucose, Bld: 99 mg/dL (ref 70–99)
Potassium: 3.9 mmol/L (ref 3.5–5.1)
Sodium: 137 mmol/L (ref 135–145)
Total Bilirubin: 1.2 mg/dL (ref 0.3–1.2)
Total Protein: 7.6 g/dL (ref 6.5–8.1)

## 2021-03-26 LAB — RESP PANEL BY RT-PCR (FLU A&B, COVID) ARPGX2
Influenza A by PCR: NEGATIVE
Influenza B by PCR: NEGATIVE
SARS Coronavirus 2 by RT PCR: NEGATIVE

## 2021-03-26 LAB — CBG MONITORING, ED: Glucose-Capillary: 91 mg/dL (ref 70–99)

## 2021-03-26 LAB — LACTIC ACID, PLASMA
Lactic Acid, Venous: 1.8 mmol/L (ref 0.5–1.9)
Lactic Acid, Venous: 2 mmol/L (ref 0.5–1.9)

## 2021-03-26 LAB — TSH: TSH: 1.089 u[IU]/mL (ref 0.350–4.500)

## 2021-03-26 LAB — TROPONIN I (HIGH SENSITIVITY)
Troponin I (High Sensitivity): 5 ng/L (ref <18)
Troponin I (High Sensitivity): 5 ng/L (ref <18)

## 2021-03-26 LAB — CK: Total CK: 638 U/L — ABNORMAL HIGH (ref 38–234)

## 2021-03-26 IMAGING — CT CT CERVICAL SPINE W/O CM
3 of 4 series · 13 of 33 positions shown, 16 images · non-contrast
Comparison: None.

CLINICAL DATA: Fall

EXAM:
CT CERVICAL SPINE WITHOUT CONTRAST
TECHNIQUE: Multidetector CT imaging of the cervical spine was performed without
intravenous contrast. Multiplanar CT image reconstructions were also
generated.

[Series 5: orthogonal bone · axial · 0.30mm/px · z∈[+1359,+1464]mm · 5 of 91 slices shown, 7 images]
[im 16/91  soft-tissue]
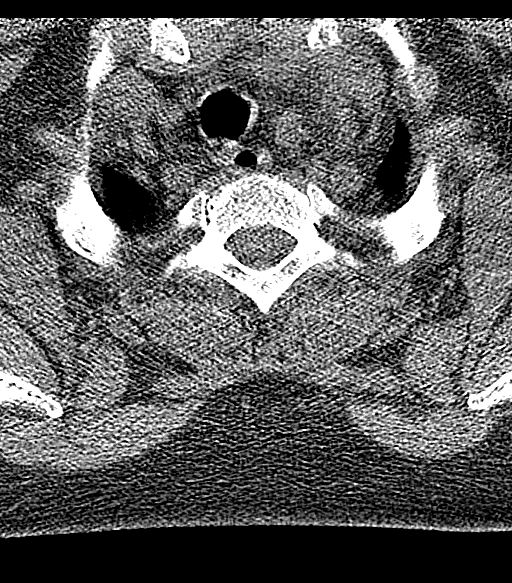
[im 16/91  bone]
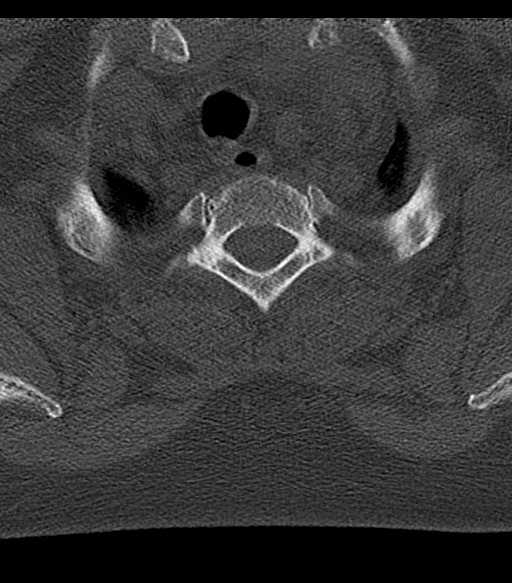
[im 31/91  bone]
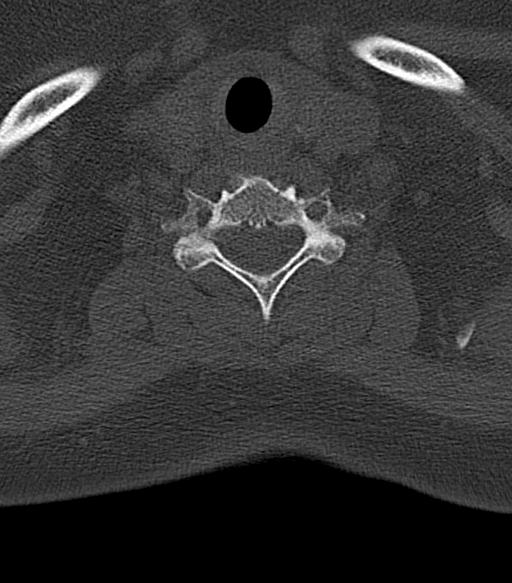
[im 46/91  bone]
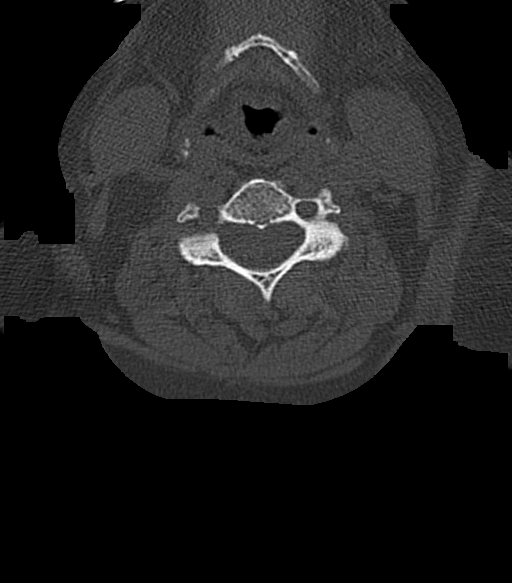
[im 61/91  bone]
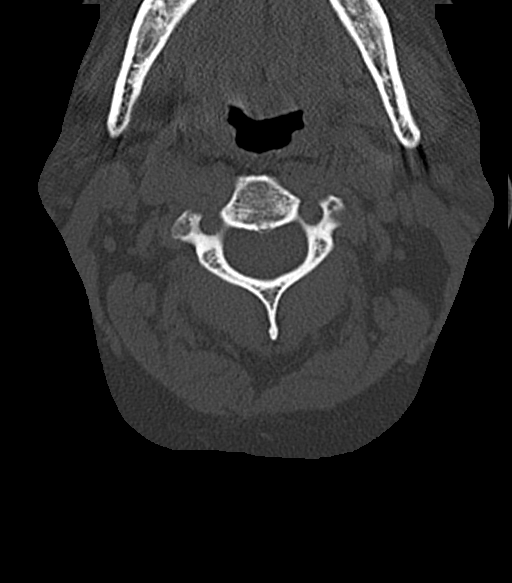
[im 76/91  soft-tissue]
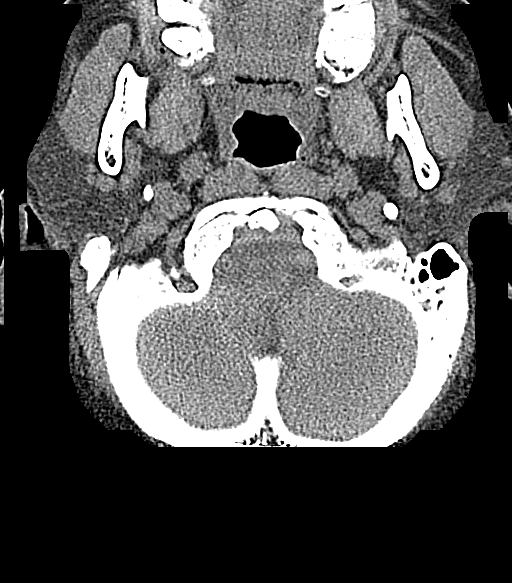
[im 76/91  bone]
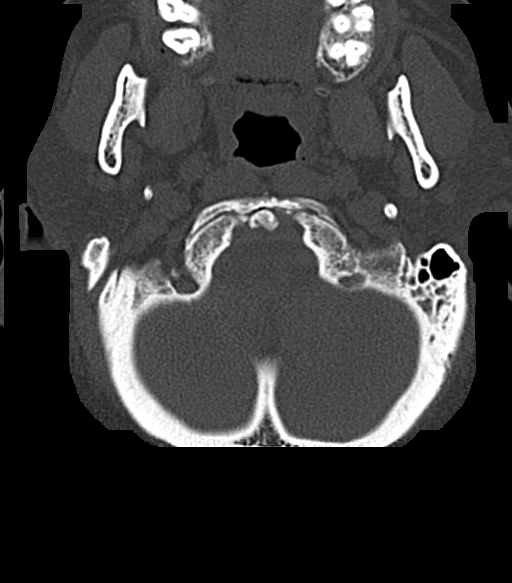

[Series 6: coronal bone · coronal · 0.30mm/px · 3 of 46 slices shown]
[im 10/46  bone]
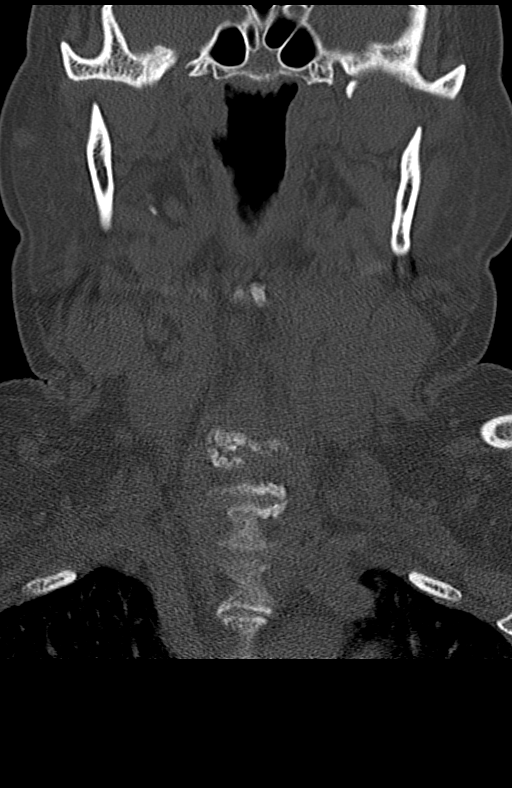
[im 19/46  bone]
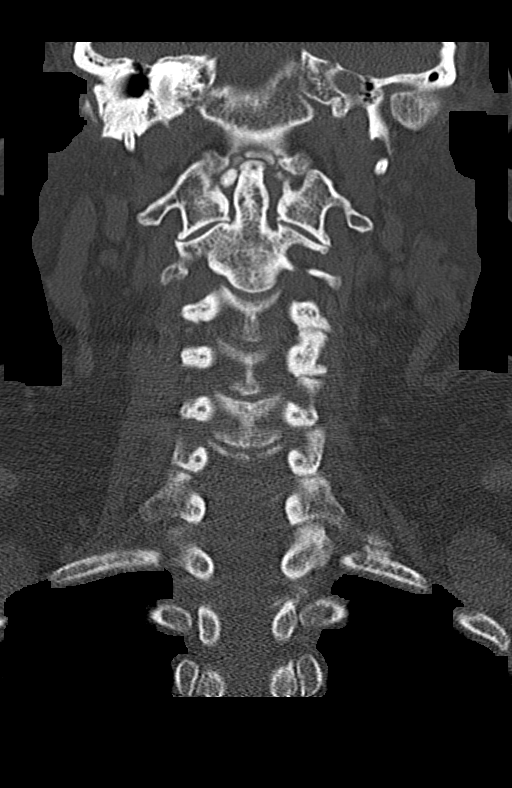
[im 28/46  bone]
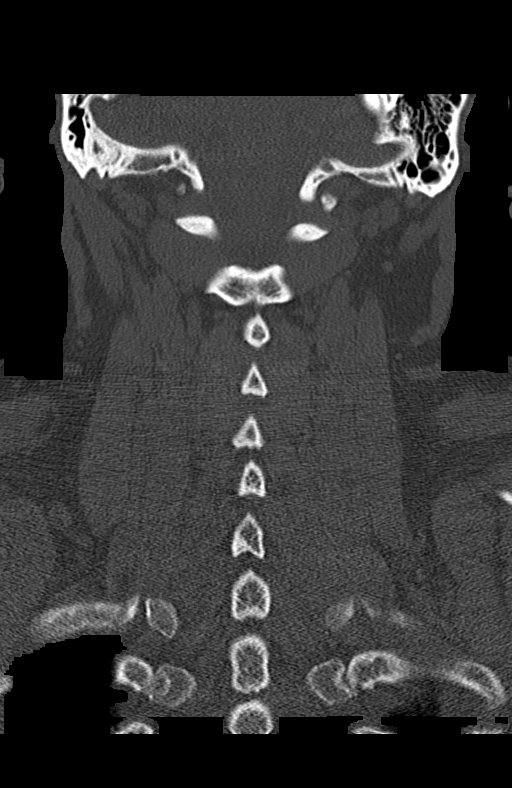

[Series 7: sagittal bone · sagittal · 0.37mm/px · 5 of 51 slices shown, 6 images]
[im 17/51  bone]
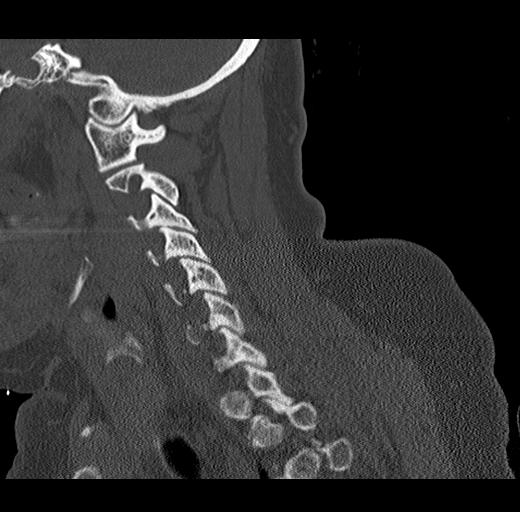
[im 21/51  bone]
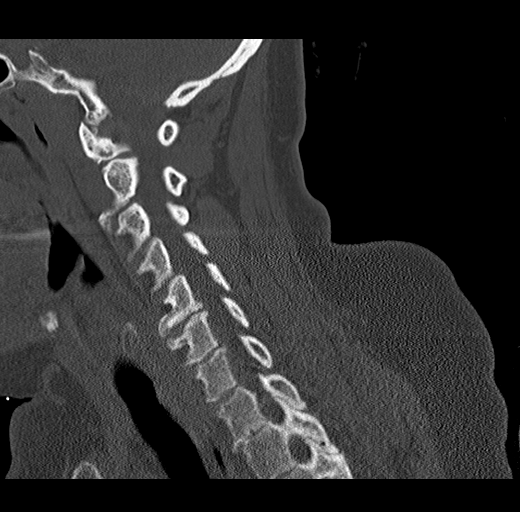
[im 26/51  soft-tissue]
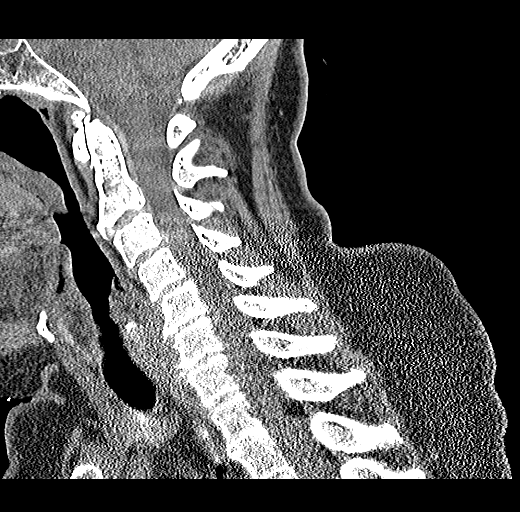
[im 26/51  bone]
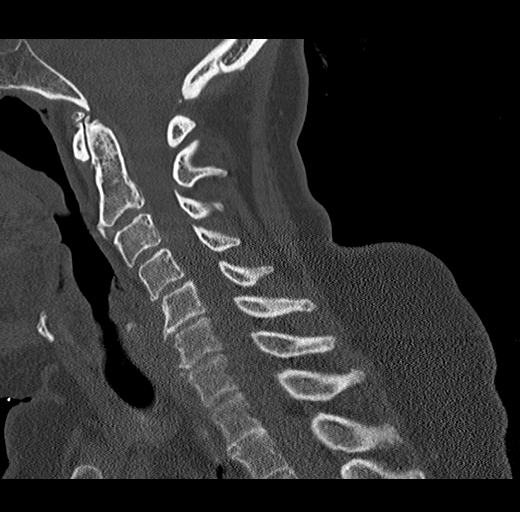
[im 30/51  bone]
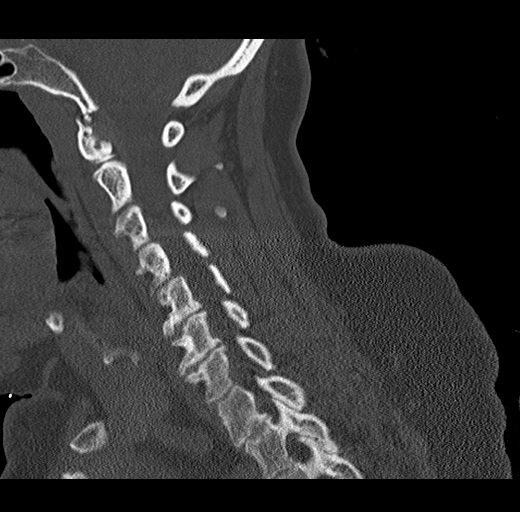
[im 34/51  bone]
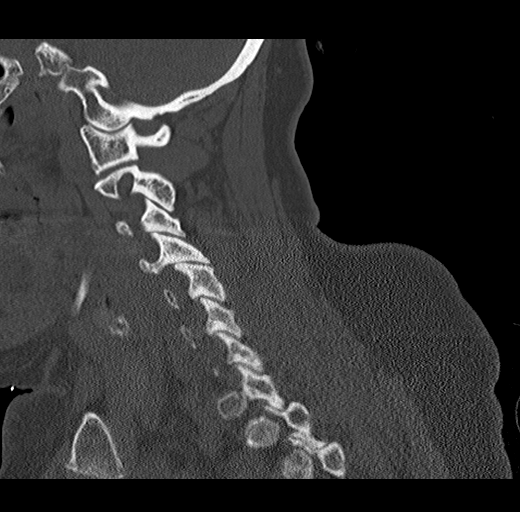

[13 of 33 positions shown; findings below may reference images not displayed]

FINDINGS: Alignment: Normal

Skull base and vertebrae: No acute fracture. No primary bone lesion
or focal pathologic process.

Soft tissues and spinal canal: No prevertebral fluid or swelling. No
visible canal hematoma.

Disc levels: Mild diffuse degenerative disc disease with disc space
narrowing and spurring. Mild diffuse bilateral degenerative facet
disease.

Upper chest: No acute findings

Other: 89
IMPRESSION: No acute bony abnormality.

## 2021-03-26 IMAGING — CT CT HEAD W/O CM
3 series · 15 of 47 positions shown, 18 images · non-contrast
Comparison: None.

CLINICAL DATA: Fall 3 weeks ago. Headache, intracranial hemorrhage
suspected

EXAM:
CT HEAD WITHOUT CONTRAST
TECHNIQUE: Contiguous axial images were obtained from the base of the skull
through the vertex without intravenous contrast.

[Series 3: head wo · axial · 0.47mm/px · z∈[+1527,+1662]mm · 9 of 33 slices shown, 12 images]
[im 3/33  brain]
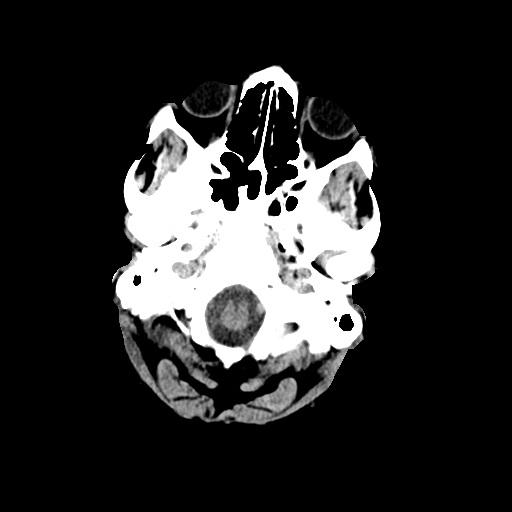
[im 3/33  bone]
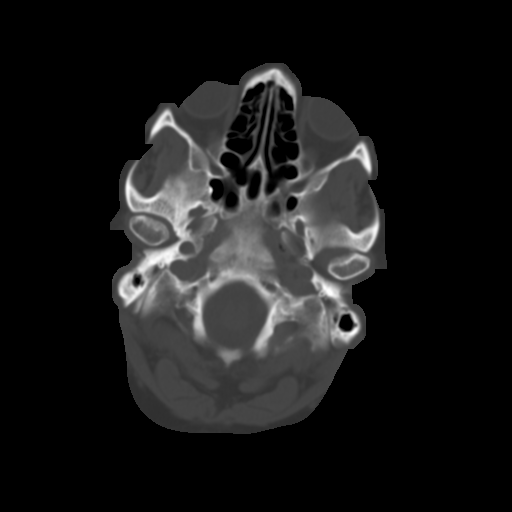
[im 6/33  brain]
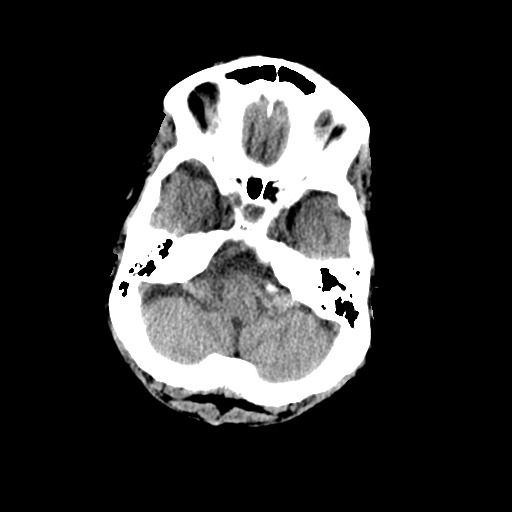
[im 9/33  brain]
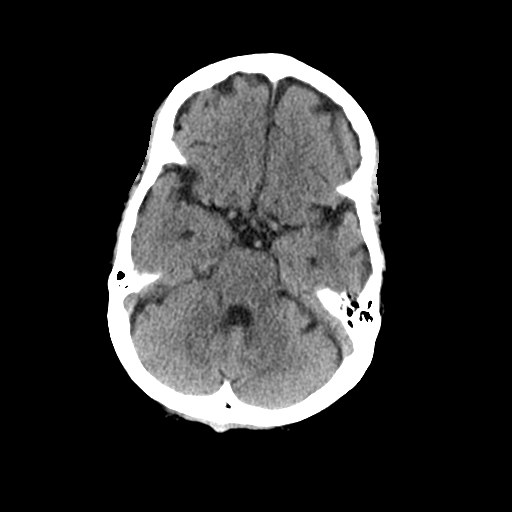
[im 13/33  brain]
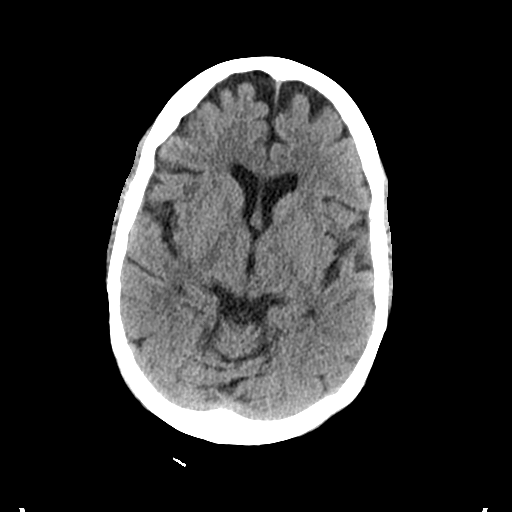
[im 17/33  brain]
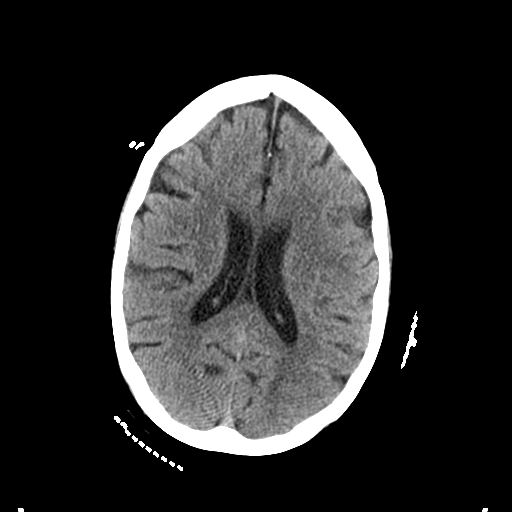
[im 17/33  bone]
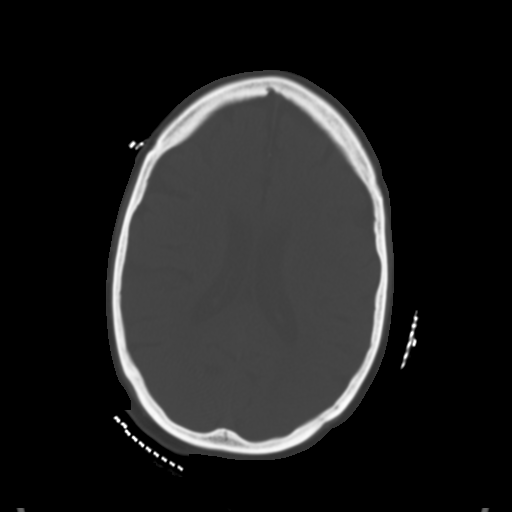
[im 20/33  brain]
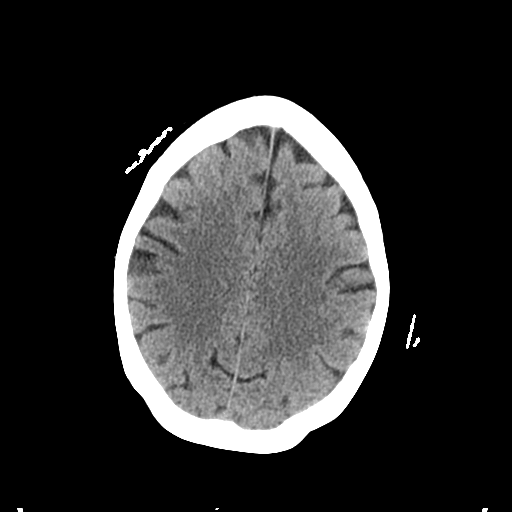
[im 24/33  brain]
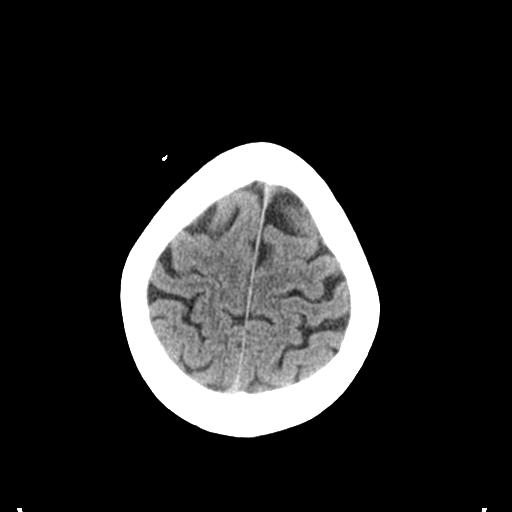
[im 27/33  brain]
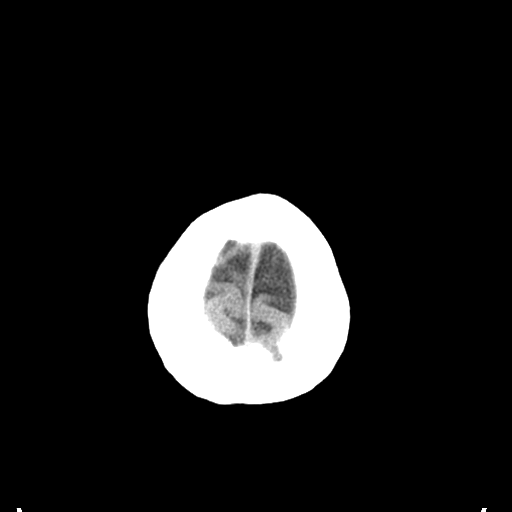
[im 30/33  brain]
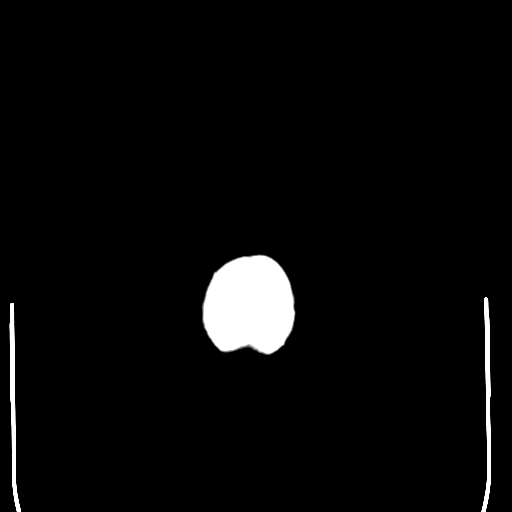
[im 30/33  bone]
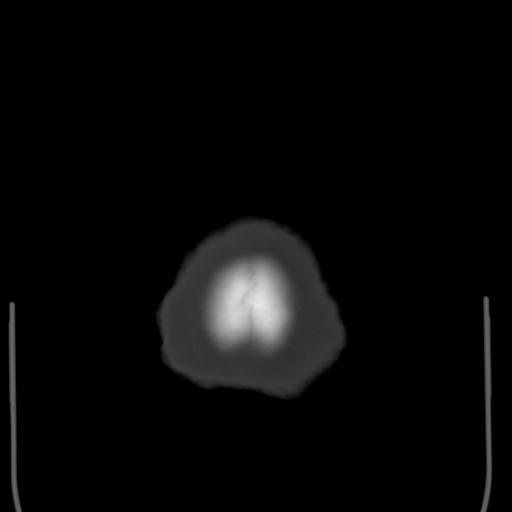

[Series 5: coronal soft tissue · coronal · 0.32mm/px · 3 of 65 slices shown]
[im 22/65  brain]
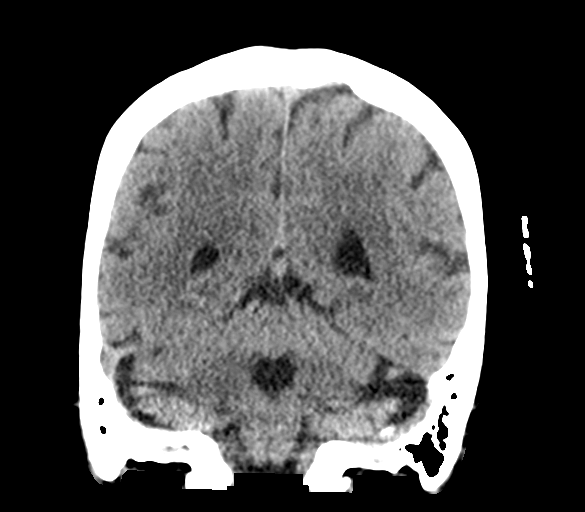
[im 29/65  brain]
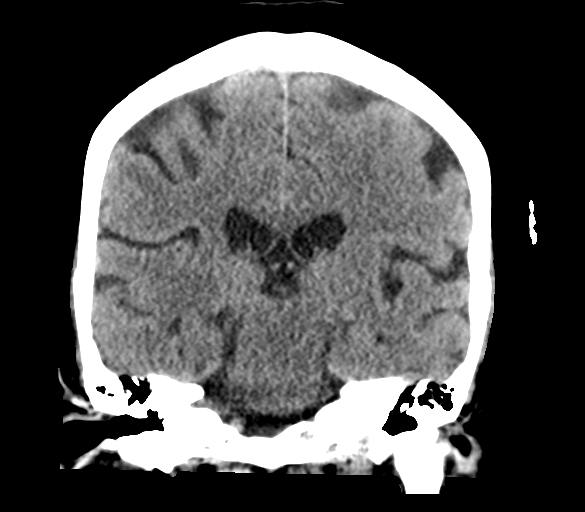
[im 36/65  brain]
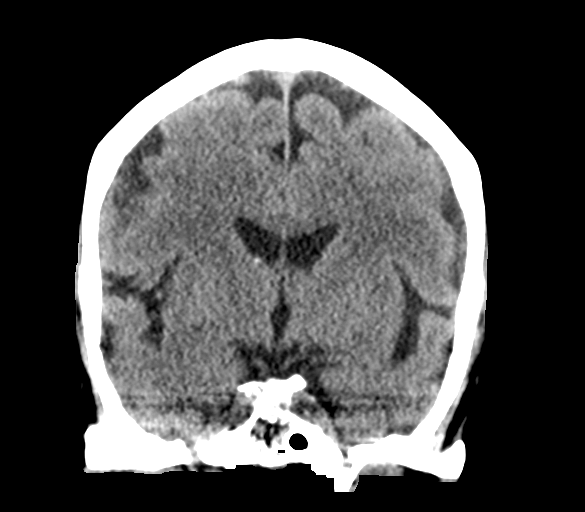

[Series 6: sagittal soft tissue · sagittal · 0.32mm/px · 3 of 48 slices shown]
[im 16/48  brain]
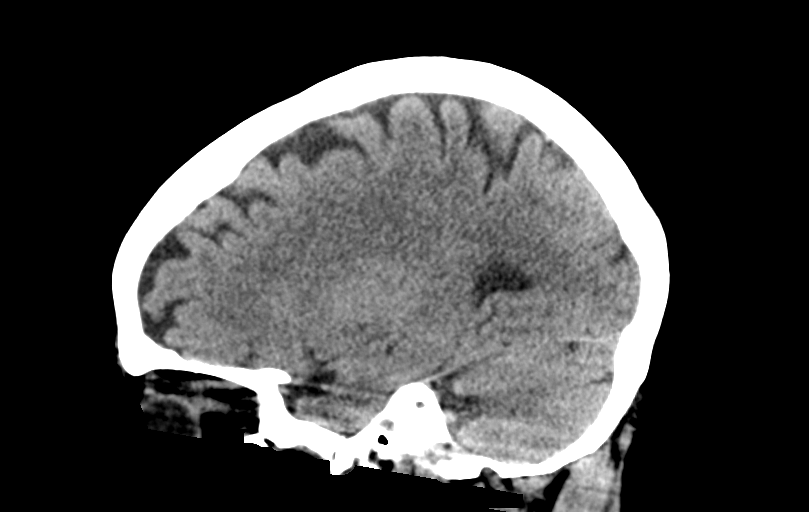
[im 24/48  brain]
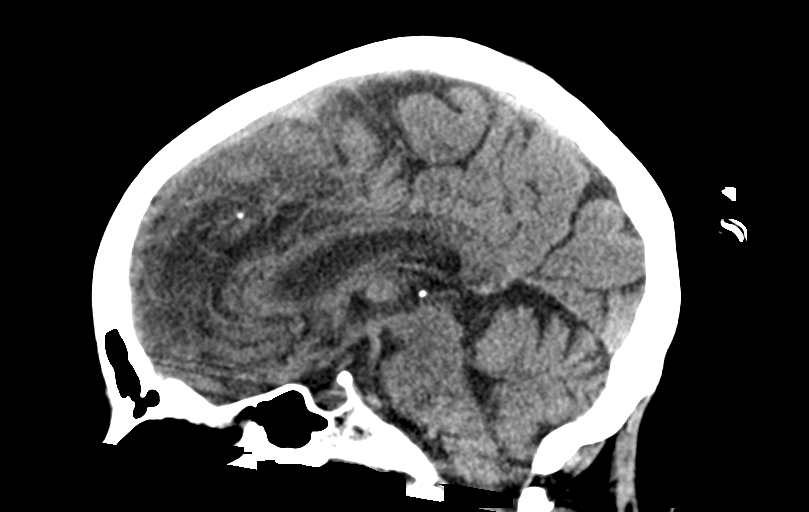
[im 32/48  brain]
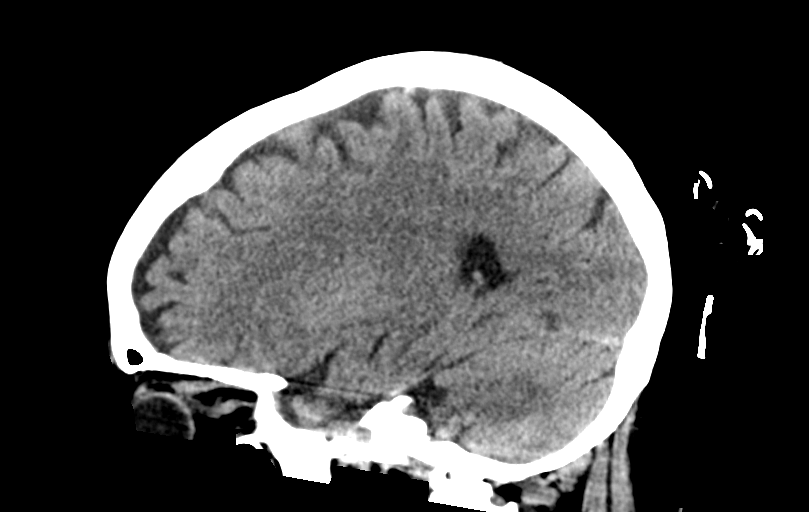

[15 of 47 positions shown; findings below may reference images not displayed]

FINDINGS: Brain: No acute intracranial abnormality. Specifically, no
hemorrhage, hydrocephalus, mass lesion, acute infarction, or
significant intracranial injury.

Vascular: No hyperdense vessel or unexpected calcification.

Skull: No acute calvarial abnormality.

Sinuses/Orbits: No acute findings

Other: None
IMPRESSION: No acute intracranial abnormality.

## 2021-03-26 IMAGING — CR DG CHEST 2V
2 series · 2 of 2 positions shown · non-contrast
Comparison: Chest x-ray [DATE].

CLINICAL DATA: Chest pain and weakness.

EXAM:
CHEST - 2 VIEW

[w chest pa]
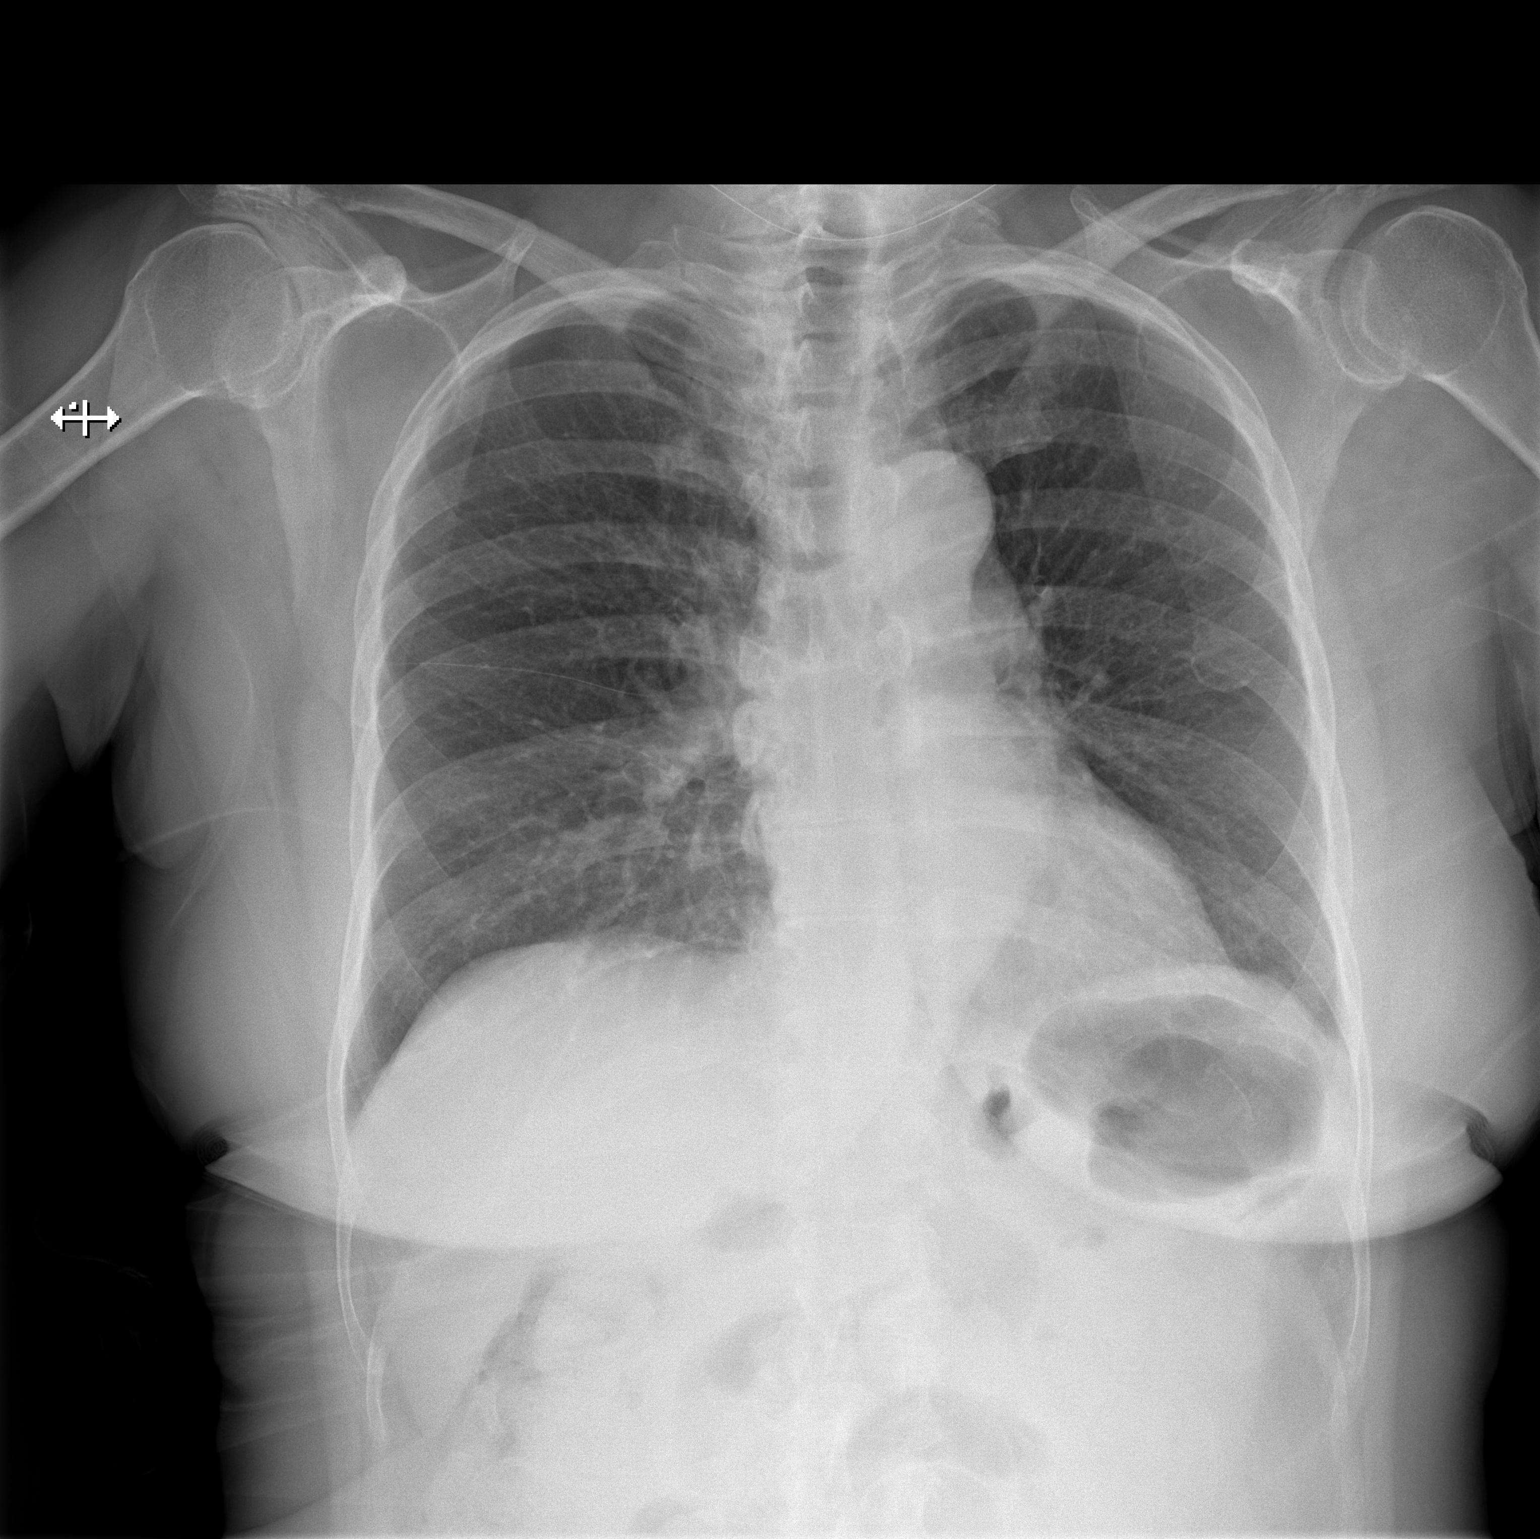

[w chest lat]
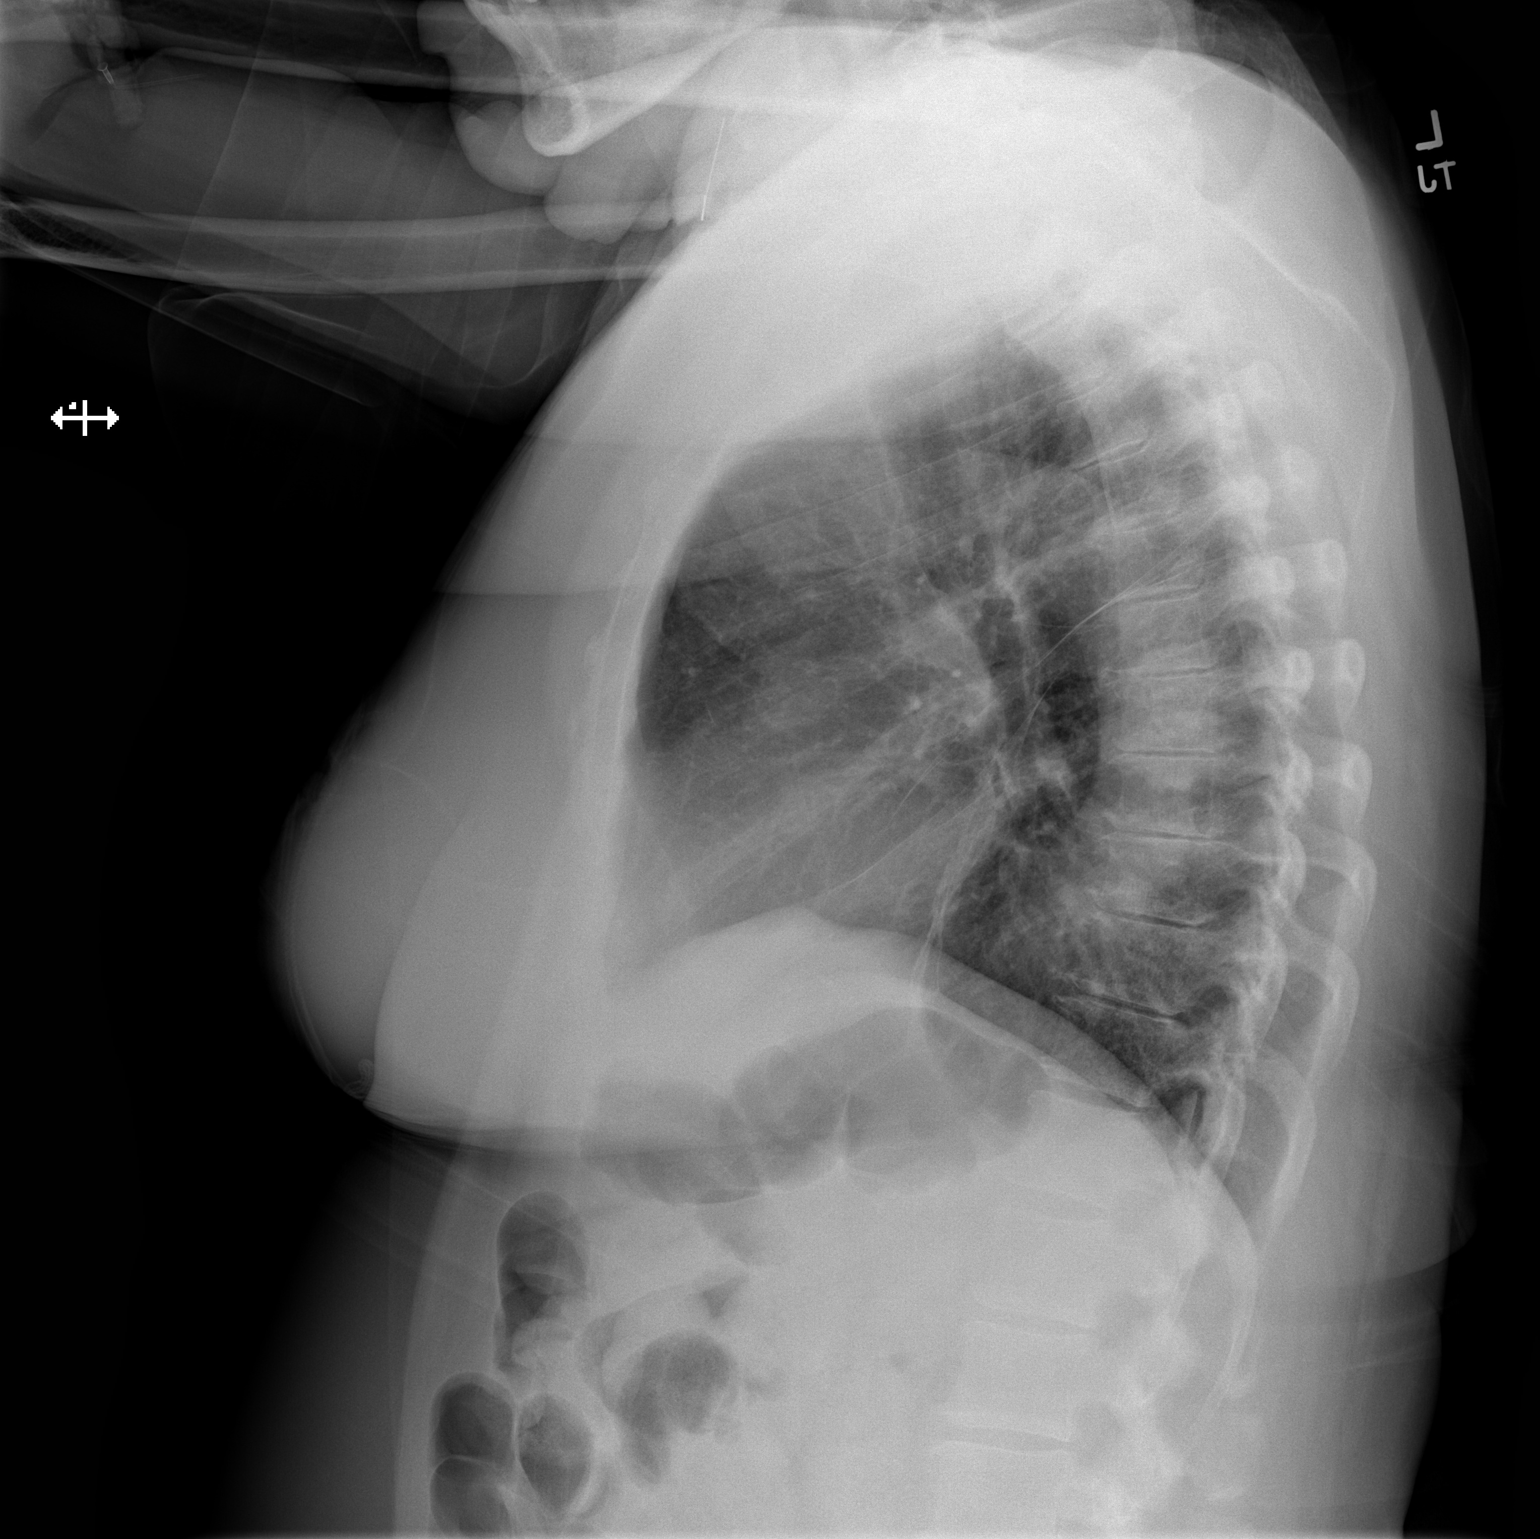

[2 of 2 positions shown; findings below may reference images not displayed]

FINDINGS: There is linear atelectasis or scarring in the left lower lobe. The
lungs are otherwise clear. There is no pleural effusion or
pneumothorax. The cardiomediastinal silhouette is within normal
limits. No acute fractures are seen.
IMPRESSION: No active cardiopulmonary disease.

## 2021-03-26 MED ORDER — LACTATED RINGERS IV BOLUS
1000.0000 mL | Freq: Once | INTRAVENOUS | Status: AC
  Administered 2021-03-26: 1000 mL via INTRAVENOUS

## 2021-03-26 MED ORDER — BUPROPION HCL ER (XL) 300 MG PO TB24
300.0000 mg | ORAL_TABLET | Freq: Every day | ORAL | Status: DC
  Administered 2021-03-27 – 2021-04-08 (×13): 300 mg via ORAL
  Filled 2021-03-26 (×14): qty 1

## 2021-03-26 MED ORDER — AMLODIPINE BESYLATE 5 MG PO TABS
5.0000 mg | ORAL_TABLET | Freq: Every day | ORAL | Status: DC
  Administered 2021-03-27 – 2021-04-08 (×13): 5 mg via ORAL
  Filled 2021-03-26 (×14): qty 1

## 2021-03-26 MED ORDER — ENOXAPARIN SODIUM 40 MG/0.4ML IJ SOSY
40.0000 mg | PREFILLED_SYRINGE | INTRAMUSCULAR | Status: DC
  Administered 2021-03-26 – 2021-04-07 (×13): 40 mg via SUBCUTANEOUS
  Filled 2021-03-26 (×13): qty 0.4

## 2021-03-26 MED ORDER — LISINOPRIL 20 MG PO TABS
30.0000 mg | ORAL_TABLET | Freq: Every day | ORAL | Status: DC
  Administered 2021-03-27 – 2021-04-08 (×13): 30 mg via ORAL
  Filled 2021-03-26 (×14): qty 1

## 2021-03-26 MED ORDER — SODIUM CHLORIDE 0.9 % IV BOLUS
1000.0000 mL | Freq: Once | INTRAVENOUS | Status: AC
  Administered 2021-03-26: 1000 mL via INTRAVENOUS

## 2021-03-26 MED ORDER — ACETAMINOPHEN 325 MG PO TABS
650.0000 mg | ORAL_TABLET | Freq: Four times a day (QID) | ORAL | Status: DC | PRN
  Administered 2021-03-27 – 2021-03-29 (×4): 650 mg via ORAL
  Filled 2021-03-26 (×4): qty 2

## 2021-03-26 MED ORDER — ESCITALOPRAM OXALATE 20 MG PO TABS
20.0000 mg | ORAL_TABLET | Freq: Every day | ORAL | Status: DC
  Administered 2021-03-27 – 2021-04-08 (×13): 20 mg via ORAL
  Filled 2021-03-26 (×14): qty 1

## 2021-03-26 MED ORDER — SODIUM CHLORIDE 0.9% FLUSH
3.0000 mL | Freq: Two times a day (BID) | INTRAVENOUS | Status: DC
  Administered 2021-03-26 – 2021-04-05 (×20): 3 mL via INTRAVENOUS

## 2021-03-26 MED ORDER — SODIUM CHLORIDE 0.9 % IV SOLN: INTRAVENOUS | Status: DC

## 2021-03-26 MED ORDER — ATORVASTATIN CALCIUM 40 MG PO TABS
40.0000 mg | ORAL_TABLET | Freq: Every day | ORAL | Status: DC
  Administered 2021-03-27: 40 mg via ORAL
  Filled 2021-03-26: qty 1

## 2021-03-26 MED ORDER — ACETAMINOPHEN 650 MG RE SUPP: 650.0000 mg | Freq: Four times a day (QID) | RECTAL | Status: DC | PRN

## 2021-03-26 MED ORDER — PANTOPRAZOLE SODIUM 40 MG PO TBEC
40.0000 mg | DELAYED_RELEASE_TABLET | Freq: Every day | ORAL | Status: DC
  Administered 2021-03-27 – 2021-04-08 (×13): 40 mg via ORAL
  Filled 2021-03-26 (×14): qty 1

## 2021-03-26 MED ORDER — FENTANYL CITRATE PF 50 MCG/ML IJ SOSY
50.0000 ug | PREFILLED_SYRINGE | Freq: Once | INTRAMUSCULAR | Status: AC
  Administered 2021-03-26: 50 ug via INTRAVENOUS
  Filled 2021-03-26 (×2): qty 1

## 2021-03-26 NOTE — ED Triage Notes (Signed)
Pt had a fall 3 weeks ago and states despite OTC medications as well as muscle relaxer's pain does not ease.  Pt having difficulty ambulating. Rates pain 8/10 generalized.

## 2021-03-26 NOTE — H&P (Signed)
History and Physical   Carrie [name] TWS:[id number] DOB: January 04, 1961 DOA: 03/26/2021  PCP: Carrie Schultz, DO   Patient coming from: Home  Chief Complaint: Fall, pain  HPI: Carrie [name] is a 60 y.o. female with medical history significant of back pain, depression, allergies, hyperlipidemia, hypertension presenting with continued pain and weakness after fall.  Patient reports that she had a fall around 3 weeks ago after missing a step.  She has had some pain in her joints since then this felt improved.  Pain is worse with ambulation.  She also reports some generalized weakness and dark urine.  She states that she is also had ongoing lightheadedness and dizziness on standing intermittently for some time since she had bronchitis.  She states she went to [facility] medical urgent care about a week ago got Flexeril and ibuprofen the ibuprofen help with her pain some but did not last.  She also has darkening of the skin along her nasal bridge and under her eyes which she states is new in the last couple days.  She also reports a rash on her upper chest with decreased pigmentation has been there since around the time she had bronchitis.  As above she reports some intermittent lightheadedness and dizziness and feeling she might pass out.  But she denies any actual passing out.  The symptoms did not precede her fall.  She denies fevers, chills, chest pain, shortness of breath, abdominal pain, constipation, diarrhea, nausea, vomiting.  ED Course: Vital signs the ED significant for heart rate in the 100s to 120s.  Reportedly her heart rate shot up from around 100-1 35 upon standing and walking with some dizziness present at that time.  Lab work-up showed CMP with bicarb 18 and mildly elevated AST 161 up from previous of around 100 and ALT of 114 down from previous of around 180.  CBC within normal limits.  Troponin normal x2.  CK mildly elevated 638.  Lactic acid normal with repeat pending.  TSH  normal.  B12 pending.  Rest were panel flu COVID pending.  Urinalysis with rare bacteria and small hemoglobin.  UDS negative.  Ethanol level pending.  Chest x-ray, CT head and CT C-spine without acute abnormality.  Patient received 2 L of IV fluids and dose of fentanyl in the ED.  Review of Systems: As per HPI otherwise all other systems reviewed and are negative.  Past Medical History:  Diagnosis Date   Hyperlipidemia    Hypertension     Past Surgical History:  Procedure Laterality Date   ABDOMINAL HYSTERECTOMY     REDUCTION MAMMAPLASTY      Social History  reports that she quit smoking about 7 years ago. Her smoking use included cigarettes. She has never used smokeless tobacco. She reports current alcohol use. She reports that she does not use drugs.  Allergies  Allergen Reactions   Zoloft [Sertraline Hcl] Diarrhea and Other (See Comments)    Stomach upset     Family History  Problem Relation Age of Onset   Breast cancer Mother        deceased by 74   Breast cancer Sister 59   Diabetes Sister    Breast cancer Other    Coronary artery disease Other    Hypertension Other   Reviewed on admission  Prior to Admission medications   Medication Sig Start Date End Date Taking? Authorizing Provider  amLODipine (NORVASC) 5 MG tablet TAKE 1 TABLET ONCE DAILY. Patient taking differently: Take 5  mg by mouth daily. 12/16/20  Yes Seabron Spates R, DO  atorvastatin (LIPITOR) 40 MG tablet TAKE ONE TABLET AT BEDTIME. Patient taking differently: Take 40 mg by mouth daily. 09/13/20  Yes Seabron Spates R, DO  buPROPion (WELLBUTRIN XL) 300 MG 24 hr tablet TAKE 1 TABLET EACH DAY. Patient taking differently: Take 300 mg by mouth daily. 02/25/21  Yes Seabron Spates R, DO  cyclobenzaprine (FLEXERIL) 10 MG tablet Take 10 mg by mouth daily as needed for muscle spasms. 02/25/21  Yes [provider]  escitalopram (LEXAPRO) 20 MG tablet Take 1 tablet (20 mg total) by mouth daily.  12/25/19  Yes Seabron Spates R, DO  fluticasone (FLONASE) 50 MCG/ACT nasal spray Place 2 sprays into both nostrils daily. Patient taking differently: Place 2 sprays into both nostrils daily as needed for allergies. 12/24/20  Yes Seabron Spates R, DO  IBU 800 MG tablet Take 800 mg by mouth every 8 (eight) hours as needed for moderate pain. 02/25/21  Yes [provider]  lisinopril (ZESTRIL) 30 MG tablet TAKE 1 TABLET ONCE DAILY. Patient taking differently: Take 30 mg by mouth daily. 12/16/20  Yes Seabron Spates R, DO  loratadine (CLARITIN) 10 MG tablet Take 1 tablet (10 mg total) by mouth daily. 12/24/20  Yes Seabron Spates R, DO  pantoprazole (PROTONIX) 40 MG tablet Take 1 tablet (40 mg total) by mouth daily. 02/29/20  Yes Seabron Spates R, DO  rosuvastatin (CRESTOR) 40 MG tablet Take 1 tablet (40 mg total) by mouth daily. 10/02/19  Yes Seabron Spates R, DO  ibuprofen (IBU) 600 MG tablet Take 1 tablet (600 mg total) by mouth 3 (three) times daily as needed. Patient not taking: No sig reported 05/28/20   Zola Button, Myrene Buddy R, DO  predniSONE (DELTASONE) 10 MG tablet TAKE 3 TABLETS PO QD FOR 3 DAYS THEN TAKE 2 TABLETS PO QD FOR 3 DAYS THEN TAKE 1 TABLET PO QD FOR 3 DAYS THEN TAKE 1/2 TAB PO QD FOR 3 DAYS Patient not taking: No sig reported 01/17/21   Carrie Schultz, DO  promethazine-dextromethorphan (PROMETHAZINE-DM) 6.25-15 MG/5ML syrup Take 5 mLs by mouth 4 (four) times daily as needed. Patient not taking: No sig reported 01/17/21   Carrie Schultz, DO    Physical Exam: Vitals:   03/26/21 1900 03/26/21 1930 03/26/21 2000 03/26/21 2030  BP: 116/86 112/78 (!) 141/94 132/90  Pulse: (!) 114 (!) 110 (!) 113 (!) 110  Resp: 17 (!) 25 19 11   Temp:      TempSrc:      SpO2: 98% 97% 99% 98%  Weight:      Height:       Physical Exam Constitutional:      General: She is not in acute distress.    Appearance: Normal appearance.  HENT:     Head:  Normocephalic and atraumatic.     Mouth/Throat:     Mouth: Mucous membranes are moist.     Pharynx: Oropharynx is clear.  Eyes:     Extraocular Movements: Extraocular movements intact.     Pupils: Pupils are equal, round, and reactive to light.  Cardiovascular:     Rate and Rhythm: Regular rhythm. Tachycardia present.     Pulses: Normal pulses.     Heart sounds: Normal heart sounds.  Pulmonary:     Effort: Pulmonary effort is normal. No respiratory distress.     Breath sounds: Normal breath sounds.  Abdominal:  General: Bowel sounds are normal. There is no distension.     Palpations: Abdomen is soft.     Tenderness: There is no abdominal tenderness.  Musculoskeletal:        General: No swelling or deformity.  Skin:    General: Skin is warm and dry.     Findings: Rash (Increased pigmentation along bridge of nose and under both eyes consistent with possible malar rash.  Also noted was decreased pigmentation of rash on upper chest.) present.  Neurological:     General: No focal deficit present.     Mental Status: Mental status is at baseline.     Labs on Admission: I have personally reviewed following labs and imaging studies  CBC: Recent Labs  Lab 03/26/21 1712  WBC 6.4  NEUTROABS 5.7  HGB 14.5  HCT 42.6  MCV 91.6  PLT 253    Basic Metabolic Panel: Recent Labs  Lab 03/26/21 1712  NA 137  K 3.9  CL 111  CO2 18*  GLUCOSE 99  BUN 20  CREATININE 1.03*  CALCIUM 9.3    GFR: Estimated Creatinine Clearance: 58.3 mL/min (A) (by C-G formula based on SCr of 1.03 mg/dL (H)).  Liver Function Tests: Recent Labs  Lab 03/26/21 1712  AST 161*  ALT 114*  ALKPHOS 80  BILITOT 1.2  PROT 7.6  ALBUMIN 3.3*    Urine analysis:    Component Value Date/Time   COLORURINE YELLOW 03/26/2021 2102   APPEARANCEUR CLOUDY (A) 03/26/2021 2102   LABSPEC 1.016 03/26/2021 2102   PHURINE 5.0 03/26/2021 2102   GLUCOSEU NEGATIVE 03/26/2021 2102   HGBUR SMALL (A) 03/26/2021 2102    BILIRUBINUR NEGATIVE 03/26/2021 2102   BILIRUBINUR neg 11/14/2015 1114   KETONESUR 20 (A) 03/26/2021 2102   PROTEINUR NEGATIVE 03/26/2021 2102   UROBILINOGEN 1.0 11/14/2015 1114   NITRITE NEGATIVE 03/26/2021 2102   LEUKOCYTESUR NEGATIVE 03/26/2021 2102    Radiological Exams on Admission: DG Chest 2 View  Result Date: 03/26/2021 CLINICAL DATA:  Chest pain and weakness. EXAM: CHEST - 2 VIEW COMPARISON:  Chest x-ray 02/29/2020. FINDINGS: There is linear atelectasis or scarring in the left lower lobe. The lungs are otherwise clear. There is no pleural effusion or pneumothorax. The cardiomediastinal silhouette is within normal limits. No acute fractures are seen. IMPRESSION: No active cardiopulmonary disease. Electronically Signed   By: Darliss Cheney M.D.   On: 03/26/2021 18:34   CT HEAD WO CONTRAST ( )  Result Date: 03/26/2021 CLINICAL DATA:  Fall 3 weeks ago. Headache, intracranial hemorrhage suspected EXAM: CT HEAD WITHOUT CONTRAST TECHNIQUE: Contiguous axial images were obtained from the base of the skull through the vertex without intravenous contrast. COMPARISON:  None. FINDINGS: Brain: No acute intracranial abnormality. Specifically, no hemorrhage, hydrocephalus, mass lesion, acute infarction, or significant intracranial injury. Vascular: No hyperdense vessel or unexpected calcification. Skull: No acute calvarial abnormality. Sinuses/Orbits: No acute findings Other: None IMPRESSION: No acute intracranial abnormality. Electronically Signed   By: Charlett Nose M.D.   On: 03/26/2021 20:32   CT Cervical Spine Wo Contrast  Result Date: 03/26/2021 CLINICAL DATA:  Fall EXAM: CT CERVICAL SPINE WITHOUT CONTRAST TECHNIQUE: Multidetector CT imaging of the cervical spine was performed without intravenous contrast. Multiplanar CT image reconstructions were also generated. COMPARISON:  None. FINDINGS: Alignment: Normal Skull base and vertebrae: No acute fracture. No primary bone lesion or focal  pathologic process. Soft tissues and spinal canal: No prevertebral fluid or swelling. No visible canal hematoma. Disc levels: Mild diffuse degenerative disc disease  with disc space narrowing and spurring. Mild diffuse bilateral degenerative facet disease. Upper chest: No acute findings Other: 89 IMPRESSION: No acute bony abnormality. Electronically Signed   By: Charlett Nose M.D.   On: 03/26/2021 20:34    EKG: Independently reviewed.  Sinus tachycardia at 123 bpm.  Low voltage.  Nonspecific T wave flattening.  Assessment/Plan Principal Problem:   Pre-syncope Active Problems:   Hyperlipidemia LDL goal <100   Depression, major, single episode, moderate (HCC)   Weakness   Joint pain  Presyncope > Patient presenting initially for a fall and continued pain address below.  Found to have significant tachycardia at worsened to the 130s with standing despite receiving IV fluids. > She reports ongoing episodes of lightheadedness and dizziness on standing with sensations in a pass out.  Denies actually passing out. > Findings consistent with presyncope versus other orthostatic tachycardia.  Will initiate syncope work-up and consider possible rheumatologic work-up as below (autoimmune disorders could contribute to possible POTS syndrome, although this is unlikely).  Low suspicion for PE at this time given association with standing and lack of shortness of breath. > TSH noted to be normal in the ED. - Monitor on telemetry - Continue with IV fluids overnight - Check formal orthostatic vital signs - Echocardiogram  Pain Weakness > Patient reports vague symptoms of pain that seem to be related to her fall however she has what appears to be symmetrical knee and hip and hand pain that is helped but not persistently helped by ibuprofen. > CT head and CT C-spine negative in ED. > Also reports diffuse weakness nothing focal that is weaker than any other region of her body. > Also noted on exam is rash with  decreased pigmentation and possible malar rash across her nasal bridge and under her eyes. > It may be worth considering rheumatologic work-up considering her joint pain, apparent malar rash, skin rash, in addition to her weakness.  Some findings could be consistent with lupus. > We may want to discuss this with rheumatology before considering further work-up and also ensure that her symptoms and/or rashes are persistent. - Continue to monitor for now. - Based upon her response initial interventions and how she is doing tomorrow, consider initial laboratory work-up possibly in consultation with rheumatology.  Hypertension - Continue home amlodipine and lisinopril  Hyperlipidemia - Continue home atorvastatin  Depression - Continue home Wellbutrin and Lexapro  DVT prophylaxis: Lovenox  Code Status:   Full Family Communication:  States her family has been updated but they have left.  She states do not need further update.   Disposition Plan:   Patient is from:  Home  Anticipated DC to:  Home  Anticipated DC date:  1 to 3 days  Anticipated DC barriers: None  Consults called:  None  Admission status:  Observation, telemetry   Severity of Illness: The appropriate patient status for this patient is OBSERVATION. Observation status is judged to be reasonable and necessary in order to provide the required intensity of service to ensure the patient's safety. The patient's presenting symptoms, physical exam findings, and initial radiographic and laboratory data in the context of their medical condition is felt to place them at decreased risk for further clinical deterioration. Furthermore, it is anticipated that the patient will be medically stable for discharge from the hospital within 2 midnights of admission.     Synetta Fail MD [facility]  How to contact the [facility] Attending or Consulting provider 7A - 7P or covering  provider during after hours 7P -7A, for this patient?   Check  the care team in [facility] and look for a) attending/consulting [facility] provider listed and b) the [facility] team listed Log into [email or web address] and use [place]'s universal password to access. If you do not have the password, please contact the hospital operator. Locate the [facility] provider you are looking for under [facility] and page to a number that you can be directly reached. If you still have difficulty reaching the provider, please page the [facility] (Director on Call) for the Hospitalists listed on amion for assistance.  03/26/2021, 10:30 PM

## 2021-03-26 NOTE — ED Notes (Signed)
Sent message to nurse about bringing the patient.

## 2021-03-26 NOTE — ED Notes (Signed)
Ambulated patient to the bathroom, hr went up to 135, pt was dizzy and stumbled in the bathroom. Assisted to toilet, was able to ambulate back to room, hr stayed around 132. Oxygen stayed around 96 throughout.

## 2021-03-26 NOTE — ED Notes (Signed)
Patient care taken, said she still hurts, md said to try the fentanyl

## 2021-03-26 NOTE — ED Provider Notes (Signed)
[facility]-EMERGENCY DEPT Provider Note   CSN: [id number] Arrival date & time: 03/26/21  1340     History Chief Complaint  Patient presents with   Marletta Lor    Carrie [name] is a 60 y.o. female.  HPI 60 year old female presents with generalized weakness and neck pain.  She fell at the end of August and has been having pain ever since.  She went to [facility] urgent care and was prescribed ibuprofen and Flexeril but is not helping.  She states he has a hard time walking, she reports a combination due to pain and weakness when asked.  She feels all over weak.  She also feels allover pain and cannot really localize it besides bilateral trapezius/neck.  She has some chest pain a couple days ago but none now.  She denies fevers.  Some slight headache but nothing specific.  Maybe some mild back pain.  However it feels like different joints are sore.  She denies abdominal pain.  She states she is not eating and drinking as much due to the pain.  She has had dark urine for the past week or so.  Past Medical History:  Diagnosis Date   Hyperlipidemia    Hypertension     Patient Active Problem List   Diagnosis Date Noted   Pre-syncope 03/26/2021   Bronchitis 01/17/2021   Depression, major, single episode, moderate (HCC) 08/25/2018   Depression, major, single episode, severe (HCC) 04/26/2017   Bunion 04/22/2017   Pruritus 08/04/2016   Environmental and seasonal allergies 08/04/2016   BACK PAIN 04/28/2010   OTHER ACUTE REACTIONS TO STRESS 03/26/2010   HOT FLASHES 01/17/2010   FATIGUE 02/02/2008   Hyperlipidemia LDL goal <100 08/18/2007   Essential hypertension 02/25/2007   Chest pain, unspecified 02/25/2007   REDUCTION MAMMOPLASTY, HX OF 02/25/2007    Past Surgical History:  Procedure Laterality Date   ABDOMINAL HYSTERECTOMY     REDUCTION MAMMAPLASTY       OB History   No obstetric history on file.     Family History  Problem Relation Age of  Onset   Breast cancer Mother        deceased by 87   Breast cancer Sister 13   Diabetes Sister    Breast cancer Other    Coronary artery disease Other    Hypertension Other     Social History   Tobacco Use   Smoking status: Former    Types: Cigarettes    Quit date: 02/05/2014    Years since quitting: 7.1   Smokeless tobacco: Never   Tobacco comments:    1 cig every 4-5 days ---weaning off  Substance Use Topics   Alcohol use: Yes    Alcohol/week: 0.0 standard drinks   Drug use: No    Home Medications Prior to Admission medications   Medication Sig Start Date End Date Taking? Authorizing Provider  amLODipine (NORVASC) 5 MG tablet TAKE 1 TABLET ONCE DAILY. Patient taking differently: Take 5 mg by mouth daily. 12/16/20  Yes Seabron Spates R, DO  atorvastatin (LIPITOR) 40 MG tablet TAKE ONE TABLET AT BEDTIME. Patient taking differently: Take 40 mg by mouth daily. 09/13/20  Yes Seabron Spates R, DO  buPROPion (WELLBUTRIN XL) 300 MG 24 hr tablet TAKE 1 TABLET EACH DAY. Patient taking differently: Take 300 mg by mouth daily. 02/25/21  Yes Seabron Spates R, DO  cyclobenzaprine (FLEXERIL) 10 MG tablet Take 10 mg by mouth daily as needed for  muscle spasms. 02/25/21  Yes [provider]  escitalopram (LEXAPRO) 20 MG tablet Take 1 tablet (20 mg total) by mouth daily. 12/25/19  Yes Seabron Spates R, DO  fluticasone (FLONASE) 50 MCG/ACT nasal spray Place 2 sprays into both nostrils daily. Patient taking differently: Place 2 sprays into both nostrils daily as needed for allergies. 12/24/20  Yes Seabron Spates R, DO  IBU 800 MG tablet Take 800 mg by mouth every 8 (eight) hours as needed for moderate pain. 02/25/21  Yes [provider]  lisinopril (ZESTRIL) 30 MG tablet TAKE 1 TABLET ONCE DAILY. Patient taking differently: Take 30 mg by mouth daily. 12/16/20  Yes Seabron Spates R, DO  loratadine (CLARITIN) 10 MG tablet Take 1 tablet (10 mg total) by mouth  daily. 12/24/20  Yes Seabron Spates R, DO  pantoprazole (PROTONIX) 40 MG tablet Take 1 tablet (40 mg total) by mouth daily. 02/29/20  Yes Seabron Spates R, DO  rosuvastatin (CRESTOR) 40 MG tablet Take 1 tablet (40 mg total) by mouth daily. 10/02/19  Yes Seabron Spates R, DO  ibuprofen (IBU) 600 MG tablet Take 1 tablet (600 mg total) by mouth 3 (three) times daily as needed. Patient not taking: No sig reported 05/28/20   Zola Button, Myrene Buddy R, DO  predniSONE (DELTASONE) 10 MG tablet TAKE 3 TABLETS PO QD FOR 3 DAYS THEN TAKE 2 TABLETS PO QD FOR 3 DAYS THEN TAKE 1 TABLET PO QD FOR 3 DAYS THEN TAKE 1/2 TAB PO QD FOR 3 DAYS Patient not taking: No sig reported 01/17/21   Donato Schultz, DO  promethazine-dextromethorphan (PROMETHAZINE-DM) 6.25-15 MG/5ML syrup Take 5 mLs by mouth 4 (four) times daily as needed. Patient not taking: No sig reported 01/17/21   Zola Button, Grayling Congress, DO    Allergies    Zoloft [sertraline hcl]  Review of Systems   Review of Systems  Constitutional:  Negative for fever.  Respiratory:  Negative for shortness of breath.   Cardiovascular:  Positive for chest pain.  Gastrointestinal:  Negative for abdominal pain, diarrhea and vomiting.  Musculoskeletal:  Positive for back pain, myalgias and neck pain.  Neurological:  Positive for weakness and headaches. Negative for numbness.  All other systems reviewed and are negative.  Physical Exam Updated Vital Signs BP 132/90   Pulse (!) 110   Temp 97.7 F (36.5 C) (Oral)   Resp 11   Ht  (1.626 m)   Wt 81 kg   SpO2 98%   BMI 30.66 kg/m   Physical Exam Vitals and nursing note reviewed.  Constitutional:      General: She is not in acute distress.    Appearance: She is well-developed. She is not ill-appearing or diaphoretic.  HENT:     Head: Normocephalic and atraumatic.     Right Ear: External ear normal.     Left Ear: External ear normal.     Nose: Nose normal.  Eyes:     General:         Right eye: No discharge.        Left eye: No discharge.  Cardiovascular:     Rate and Rhythm: Regular rhythm. Tachycardia present.     Heart sounds: Normal heart sounds.  Pulmonary:     Effort: Pulmonary effort is normal.     Breath sounds: Normal breath sounds.  Abdominal:     Palpations: Abdomen is soft.     Tenderness: There is no abdominal tenderness.  Musculoskeletal:     Comments: Patient reports some mild soreness when palpating along C, T, L-spine but nothing specific.  There is some trapezius muscular soreness.  No obvious joint pain or decreased range of motion including hips, knees, elbows, wrists and shoulders  Skin:    General: Skin is warm and dry.  Neurological:     Mental Status: She is alert and oriented to person, place, and time.     Comments: Patient has diffusely weak strength in all 4 extremities. She is able to stand but requires assistance and feels too weak to walk  Psychiatric:        Mood and Affect: Mood is not anxious.    ED Results / Procedures / Treatments   Labs (all labs ordered are listed, but only abnormal results are displayed) Labs Reviewed  COMPREHENSIVE METABOLIC PANEL - Abnormal; Notable for the following components:      Result Value   CO2 18 (*)    Creatinine, Ser 1.03 (*)    Albumin 3.3 (*)    AST 161 (*)    ALT 114 (*)    All other components within normal limits  URINALYSIS, ROUTINE W REFLEX MICROSCOPIC - Abnormal; Notable for the following components:   APPearance CLOUDY (*)    Hgb urine dipstick SMALL (*)    Ketones, ur 20 (*)    Bacteria, UA RARE (*)    Non Squamous Epithelial 0-5 (*)    All other components within normal limits  CBC WITH DIFFERENTIAL/PLATELET - Abnormal; Notable for the following components:   Lymphs Abs 0.4 (*)    All other components within normal limits  CK - Abnormal; Notable for the following components:   Total CK 638 (*)    All other components within normal limits  RESP PANEL BY RT-PCR (FLU A&B,  COVID) ARPGX2  TSH  RAPID URINE DRUG SCREEN, HOSP PERFORMED  LACTIC ACID, PLASMA  LACTIC ACID, PLASMA  VITAMIN B12  ETHANOL  HIV ANTIBODY (ROUTINE TESTING W REFLEX)  COMPREHENSIVE METABOLIC PANEL  TSH  CBG MONITORING, ED  TROPONIN I (HIGH SENSITIVITY)  TROPONIN I (HIGH SENSITIVITY)    EKG EKG Interpretation  Date/Time:  Wednesday March 26 2021 20:54:37 EDT Ventricular Rate:  123 PR Interval:  134 QRS Duration: 65 QT Interval:  331 QTC Calculation: 456 R Axis:   10 Text Interpretation: Sinus tachycardia Paired ventricular premature complexes Consider right atrial enlargement Low voltage, extremity and precordial leads Confirmed by Pricilla Loveless [phone]) on 03/26/2021 9:45:00 PM  Radiology DG Chest 2 View  Result Date: 03/26/2021 CLINICAL DATA:  Chest pain and weakness. EXAM: CHEST - 2 VIEW COMPARISON:  Chest x-ray 02/29/2020. FINDINGS: There is linear atelectasis or scarring in the left lower lobe. The lungs are otherwise clear. There is no pleural effusion or pneumothorax. The cardiomediastinal silhouette is within normal limits. No acute fractures are seen. IMPRESSION: No active cardiopulmonary disease. Electronically Signed   By: Darliss Cheney M.D.   On: 03/26/2021 18:34   CT HEAD WO CONTRAST ( )  Result Date: 03/26/2021 CLINICAL DATA:  Fall 3 weeks ago. Headache, intracranial hemorrhage suspected EXAM: CT HEAD WITHOUT CONTRAST TECHNIQUE: Contiguous axial images were obtained from the base of the skull through the vertex without intravenous contrast. COMPARISON:  None. FINDINGS: Brain: No acute intracranial abnormality. Specifically, no hemorrhage, hydrocephalus, mass lesion, acute infarction, or significant intracranial injury. Vascular: No hyperdense vessel or unexpected calcification. Skull: No acute calvarial abnormality. Sinuses/Orbits: No acute findings Other: None IMPRESSION: No acute intracranial  abnormality. Electronically Signed   By: Charlett Nose M.D.   On:  03/26/2021 20:32   CT Cervical Spine Wo Contrast  Result Date: 03/26/2021 CLINICAL DATA:  Fall EXAM: CT CERVICAL SPINE WITHOUT CONTRAST TECHNIQUE: Multidetector CT imaging of the cervical spine was performed without intravenous contrast. Multiplanar CT image reconstructions were also generated. COMPARISON:  None. FINDINGS: Alignment: Normal Skull base and vertebrae: No acute fracture. No primary bone lesion or focal pathologic process. Soft tissues and spinal canal: No prevertebral fluid or swelling. No visible canal hematoma. Disc levels: Mild diffuse degenerative disc disease with disc space narrowing and spurring. Mild diffuse bilateral degenerative facet disease. Upper chest: No acute findings Other: 89 IMPRESSION: No acute bony abnormality. Electronically Signed   By: Charlett Nose M.D.   On: 03/26/2021 20:34    Procedures Ultrasound ED Echo  Date/Time: 03/26/2021 10:17 PM Performed by: Pricilla Loveless, MD Authorized by: Pricilla Loveless, MD   Procedure details:    Indications: hypotension     Views: subxiphoid and parasternal long axis view     Images: archived     Limitations:  Acoustic shadowing and body habitus Findings:    Pericardium: small pericardial effusion     Cardiac Activity: hyperdynamic     LV Function: normal (>50% EF)   Impression:    Impression comment:  Questionable pericardial effusion   Medications Ordered in ED Medications  amLODipine (NORVASC) tablet 5 mg (has no administration in time range)  atorvastatin (LIPITOR) tablet 40 mg (has no administration in time range)  lisinopril (ZESTRIL) tablet 30 mg (has no administration in time range)  buPROPion (WELLBUTRIN XL) 24 hr tablet 300 mg (has no administration in time range)  escitalopram (LEXAPRO) tablet 20 mg (has no administration in time range)  pantoprazole (PROTONIX) EC tablet 40 mg (has no administration in time range)  sodium chloride flush (NS) 0.9 % injection 3 mL (has no administration in time range)   enoxaparin (LOVENOX) injection 40 mg (has no administration in time range)  acetaminophen (TYLENOL) tablet 650 mg (has no administration in time range)    Or  acetaminophen (TYLENOL) suppository 650 mg (has no administration in time range)  0.9 %  sodium chloride infusion (has no administration in time range)  fentaNYL (SUBLIMAZE) injection 50 mcg (50 mcg Intravenous Given 03/26/21 1943)  sodium chloride 0.9 % bolus 1,000 mL (0 mLs Intravenous Stopped 03/26/21 1953)  lactated ringers bolus 1,000 mL (1,000 mLs Intravenous New Bag/Given 03/26/21 2059)    ED Course  I have reviewed the triage vital signs and the nursing notes.  Pertinent labs & imaging results that were available during my care of the patient were reviewed by me and considered in my medical decision making (see chart for details).    MDM Rules/Calculators/A&P                           Patient endorses "pain" all over but the more she describes it almost sounds like she is more weak and uncomfortable.  There is no focal area of pain.  She was given a bolus of IV fluids and fentanyl but still remains tachycardic.  It appears to be sinus on the ECG.  She was able to get up and ambulate after the first fluids but she almost fell to the ground and got dizzy and her heart rate went up to 135.  Unclear exact cause.  She does have a low bicarbonate but normal anion gap.  Extra labs were ordered but no significant findings to explain her symptoms.  The original fall a month and a half ago seem to be because she tripped rather than near syncope.  At this point, I think she will need fluids, admission, and further work-up.  There are no hard, focal neuro signs.  I did do a bedside ultrasound which shows perhaps a small pericardial effusion but I do not think this would be large enough or showing signs that it is symptomatic.  Probably would be worth getting a formal echo in the hospital.  Dr. Alinda Money to admit.  No chest pain or dyspnea and I  think PE is a lot less likely. Final Clinical Impression(s) / ED Diagnoses Final diagnoses:  Generalized weakness  Tachycardia    Rx / DC Orders ED Discharge Orders     None        Pricilla Loveless, MD 03/26/21 2219

## 2021-03-26 NOTE — ED Provider Notes (Signed)
Emergency Medicine Provider Triage Evaluation Note  Carrie [name] , a 60 y.o. female  was evaluated in triage.  Pt complains of a fall in her house 3 weeks ago.  She states that she missed a step, and fell backwards on her left side.  No head trauma or LOC.  Today she is complaining of bilateral hip, knee, and hand pain.  She states that her pain is made worse with movement and ambulating.  She has been trying ibuprofen and muscle relaxers without relief.  Review of Systems  Positive: Hip pain, knee pain, hand pain Negative: Neck pain, back pain, headaches, numbness, tingling, weakness  Physical Exam  BP 105/83 (BP Location: Right Arm)   Pulse (!) 126   Temp 98.5 F (36.9 C) (Oral)   Resp 16   SpO2 98%  Gen:   Awake, no distress   Resp:  Normal effort  MSK:   Moves extremities without difficulty  Other:    Medical Decision Making  Medically screening exam initiated at 2:51 PM.  Appropriate orders placed.  Carrie [name] was informed that the remainder of the evaluation will be completed by another provider, this initial triage assessment does not replace that evaluation, and the importance of remaining in the ED until their evaluation is complete.     Jeanella Flattery 03/26/21 1459    Linwood Dibbles, MD 03/27/21 [phone]

## 2021-03-26 NOTE — ED Notes (Signed)
Pt in imaging

## 2021-03-26 NOTE — ED Notes (Signed)
EDP at the bedside.  ?

## 2021-03-27 ENCOUNTER — Telehealth: Payer: Self-pay

## 2021-03-27 ENCOUNTER — Observation Stay (HOSPITAL_COMMUNITY): Payer: Federal, State, Local not specified - PPO

## 2021-03-27 DIAGNOSIS — W108XXD Fall (on) (from) other stairs and steps, subsequent encounter: Secondary | ICD-10-CM | POA: Diagnosis not present

## 2021-03-27 DIAGNOSIS — I3139 Other pericardial effusion (noninflammatory): Secondary | ICD-10-CM | POA: Diagnosis present

## 2021-03-27 DIAGNOSIS — Z20822 Contact with and (suspected) exposure to covid-19: Secondary | ICD-10-CM | POA: Diagnosis present

## 2021-03-27 DIAGNOSIS — E876 Hypokalemia: Secondary | ICD-10-CM | POA: Diagnosis present

## 2021-03-27 DIAGNOSIS — E860 Dehydration: Secondary | ICD-10-CM | POA: Diagnosis present

## 2021-03-27 DIAGNOSIS — R531 Weakness: Secondary | ICD-10-CM | POA: Diagnosis not present

## 2021-03-27 DIAGNOSIS — R410 Disorientation, unspecified: Secondary | ICD-10-CM | POA: Diagnosis not present

## 2021-03-27 DIAGNOSIS — W1830XA Fall on same level, unspecified, initial encounter: Secondary | ICD-10-CM | POA: Diagnosis present

## 2021-03-27 DIAGNOSIS — G3184 Mild cognitive impairment, so stated: Secondary | ICD-10-CM | POA: Diagnosis present

## 2021-03-27 DIAGNOSIS — Z8249 Family history of ischemic heart disease and other diseases of the circulatory system: Secondary | ICD-10-CM | POA: Diagnosis not present

## 2021-03-27 DIAGNOSIS — I10 Essential (primary) hypertension: Secondary | ICD-10-CM | POA: Diagnosis not present

## 2021-03-27 DIAGNOSIS — E871 Hypo-osmolality and hyponatremia: Secondary | ICD-10-CM | POA: Diagnosis not present

## 2021-03-27 DIAGNOSIS — M1612 Unilateral primary osteoarthritis, left hip: Secondary | ICD-10-CM | POA: Diagnosis not present

## 2021-03-27 DIAGNOSIS — M25562 Pain in left knee: Secondary | ICD-10-CM | POA: Diagnosis not present

## 2021-03-27 DIAGNOSIS — R945 Abnormal results of liver function studies: Secondary | ICD-10-CM | POA: Diagnosis not present

## 2021-03-27 DIAGNOSIS — E669 Obesity, unspecified: Secondary | ICD-10-CM | POA: Diagnosis present

## 2021-03-27 DIAGNOSIS — Z6830 Body mass index (BMI) 30.0-30.9, adult: Secondary | ICD-10-CM | POA: Diagnosis not present

## 2021-03-27 DIAGNOSIS — Z87891 Personal history of nicotine dependence: Secondary | ICD-10-CM | POA: Diagnosis not present

## 2021-03-27 DIAGNOSIS — Z6826 Body mass index (BMI) 26.0-26.9, adult: Secondary | ICD-10-CM | POA: Diagnosis not present

## 2021-03-27 DIAGNOSIS — I9589 Other hypotension: Secondary | ICD-10-CM | POA: Diagnosis not present

## 2021-03-27 DIAGNOSIS — R55 Syncope and collapse: Secondary | ICD-10-CM | POA: Diagnosis not present

## 2021-03-27 DIAGNOSIS — E785 Hyperlipidemia, unspecified: Secondary | ICD-10-CM | POA: Diagnosis not present

## 2021-03-27 DIAGNOSIS — B370 Candidal stomatitis: Secondary | ICD-10-CM | POA: Diagnosis not present

## 2021-03-27 DIAGNOSIS — M6282 Rhabdomyolysis: Secondary | ICD-10-CM | POA: Diagnosis not present

## 2021-03-27 DIAGNOSIS — Z23 Encounter for immunization: Secondary | ICD-10-CM | POA: Diagnosis not present

## 2021-03-27 DIAGNOSIS — K828 Other specified diseases of gallbladder: Secondary | ICD-10-CM | POA: Diagnosis not present

## 2021-03-27 DIAGNOSIS — M25552 Pain in left hip: Secondary | ICD-10-CM | POA: Diagnosis not present

## 2021-03-27 DIAGNOSIS — R000 Tachycardia, unspecified: Secondary | ICD-10-CM | POA: Diagnosis present

## 2021-03-27 DIAGNOSIS — F32A Depression, unspecified: Secondary | ICD-10-CM | POA: Diagnosis not present

## 2021-03-27 DIAGNOSIS — I959 Hypotension, unspecified: Secondary | ICD-10-CM | POA: Diagnosis present

## 2021-03-27 DIAGNOSIS — R5381 Other malaise: Secondary | ICD-10-CM | POA: Diagnosis not present

## 2021-03-27 DIAGNOSIS — E43 Unspecified severe protein-calorie malnutrition: Secondary | ICD-10-CM | POA: Diagnosis not present

## 2021-03-27 DIAGNOSIS — R21 Rash and other nonspecific skin eruption: Secondary | ICD-10-CM | POA: Diagnosis present

## 2021-03-27 DIAGNOSIS — Z833 Family history of diabetes mellitus: Secondary | ICD-10-CM | POA: Diagnosis not present

## 2021-03-27 DIAGNOSIS — K760 Fatty (change of) liver, not elsewhere classified: Secondary | ICD-10-CM | POA: Diagnosis present

## 2021-03-27 DIAGNOSIS — F322 Major depressive disorder, single episode, severe without psychotic features: Secondary | ICD-10-CM | POA: Diagnosis not present

## 2021-03-27 DIAGNOSIS — M069 Rheumatoid arthritis, unspecified: Secondary | ICD-10-CM | POA: Diagnosis not present

## 2021-03-27 DIAGNOSIS — R532 Functional quadriplegia: Secondary | ICD-10-CM | POA: Diagnosis present

## 2021-03-27 DIAGNOSIS — Z79899 Other long term (current) drug therapy: Secondary | ICD-10-CM | POA: Diagnosis not present

## 2021-03-27 DIAGNOSIS — F321 Major depressive disorder, single episode, moderate: Secondary | ICD-10-CM | POA: Diagnosis not present

## 2021-03-27 DIAGNOSIS — Z90710 Acquired absence of both cervix and uterus: Secondary | ICD-10-CM | POA: Diagnosis not present

## 2021-03-27 LAB — ECHOCARDIOGRAM COMPLETE
Area-P 1/2: 5.27 cm2
Calc EF: 69.1 %
Height: 64 in
S' Lateral: 2.4 cm
Single Plane A2C EF: 64.4 %
Single Plane A4C EF: 74.8 %
Weight: 2726.65 oz

## 2021-03-27 LAB — MAGNESIUM: Magnesium: 2 mg/dL (ref 1.7–2.4)

## 2021-03-27 LAB — COMPREHENSIVE METABOLIC PANEL
ALT: 87 U/L — ABNORMAL HIGH (ref 0–44)
AST: 118 U/L — ABNORMAL HIGH (ref 15–41)
Albumin: 2.6 g/dL — ABNORMAL LOW (ref 3.5–5.0)
Alkaline Phosphatase: 64 U/L (ref 38–126)
Anion gap: 4 — ABNORMAL LOW (ref 5–15)
BUN: 14 mg/dL (ref 8–23)
CO2: 19 mmol/L — ABNORMAL LOW (ref 22–32)
Calcium: 8.2 mg/dL — ABNORMAL LOW (ref 8.9–10.3)
Chloride: 111 mmol/L (ref 98–111)
Creatinine, Ser: 0.76 mg/dL (ref 0.44–1.00)
GFR, Estimated: >60 mL/min (ref >60)
Glucose, Bld: 111 mg/dL — ABNORMAL HIGH (ref 70–99)
Potassium: 3.4 mmol/L — ABNORMAL LOW (ref 3.5–5.1)
Sodium: 134 mmol/L — ABNORMAL LOW (ref 135–145)
Total Bilirubin: 0.9 mg/dL (ref 0.3–1.2)
Total Protein: 6.2 g/dL — ABNORMAL LOW (ref 6.5–8.1)

## 2021-03-27 LAB — GLUCOSE, CAPILLARY: Glucose-Capillary: 114 mg/dL — ABNORMAL HIGH (ref 70–99)

## 2021-03-27 LAB — CK: Total CK: 484 U/L — ABNORMAL HIGH (ref 38–234)

## 2021-03-27 LAB — C-REACTIVE PROTEIN: CRP: 2.5 mg/dL — ABNORMAL HIGH (ref <1.0)

## 2021-03-27 LAB — HIV ANTIBODY (ROUTINE TESTING W REFLEX): HIV Screen 4th Generation wRfx: NONREACTIVE

## 2021-03-27 LAB — TSH: TSH: 1.17 u[IU]/mL (ref 0.350–4.500)

## 2021-03-27 LAB — SEDIMENTATION RATE: Sed Rate: 26 mm/hr — ABNORMAL HIGH (ref 0–22)

## 2021-03-27 MED ORDER — POTASSIUM CHLORIDE 2 MEQ/ML IV SOLN
INTRAVENOUS | Status: DC
  Filled 2021-03-27 (×2): qty 1000

## 2021-03-27 MED ORDER — TRAMADOL HCL 50 MG PO TABS
50.0000 mg | ORAL_TABLET | Freq: Two times a day (BID) | ORAL | Status: DC | PRN
  Administered 2021-03-29 – 2021-04-02 (×3): 50 mg via ORAL
  Filled 2021-03-27 (×3): qty 1

## 2021-03-27 MED ORDER — PERFLUTREN LIPID MICROSPHERE
1.0000 mL | INTRAVENOUS | Status: AC | PRN
  Administered 2021-03-27: 2 mL via INTRAVENOUS
  Filled 2021-03-27: qty 10

## 2021-03-27 MED ORDER — POTASSIUM CHLORIDE CRYS ER 20 MEQ PO TBCR
40.0000 meq | EXTENDED_RELEASE_TABLET | Freq: Once | ORAL | Status: AC
  Administered 2021-03-27: 40 meq via ORAL
  Filled 2021-03-27: qty 2

## 2021-03-27 MED ORDER — POTASSIUM CHLORIDE 2 MEQ/ML IV SOLN: INTRAVENOUS | Status: DC

## 2021-03-27 NOTE — Progress Notes (Signed)
PROGRESS NOTE   Carrie [name]  JJO:[id number]    DOB: 04-11-1961    DOA: 03/26/2021  PCP: Ann Held, DO   I have briefly reviewed patients previous medical records in [facility].  Chief Complaint  Patient presents with   Fall    Brief Narrative:  60 year old female, lives alone, medical history significant for hypertension, hyperlipidemia, depression, sustained a mechanical fall at home on 02/01/2021 after missing a step.  She had no LOC.  She did not seek immediate medical attention.  However over the next 2 weeks, she noted progressive generalized weakness and pain for which she was seen at an urgent care and prescribed muscle relaxants and NSAIDs with some relief.  However since then, she has continued to feel progressively weak, generalized pains, dizzy without vertigo, no syncope or near syncope and presented to the ED.  She has some rash over the upper chest which preceded her fall.  Initially admitted for presyncope, dehydration and generalized weakness.   Assessment & Plan:  Principal Problem:   Pre-syncope Active Problems:   Hyperlipidemia LDL goal <100   Depression, major, single episode, moderate (HCC)   Weakness   Joint pain   Generalized pain/arthralgia and profound weakness: - Unclear etiology.  Although patient attributes it all to her fall, that was almost 2 months ago and she has only progressively gotten weaker.  Patient's sister at bedside agrees. - CK is mildly elevated.  She does have a almost healed mid upper anterior chest skin lesion which does not clearly seem like an autoimmune type but uncertain.  I do not appreciate a malar rash. - Autoimmune etiology is of concern. - I discussed with Dr. Kathlene November, rheumatology on the phone on 10/20.  As per recommendations, ordered ESR, CRP, RA, ANA, CCP.  Differential diagnosis can be many.  His office will coordinate with patient for an early outpatient consultation appointment for next week. - Given  elevated CK, hold statins for now.  Dizziness/?  Presyncope - Unclear etiology. - Orthostatic vitals checked and negative. - TSH normal.  Telemetry shows low voltage but normal sinus rhythm without arrhythmias or pauses. - Some of this could have been due to dehydration, received IV fluids, clinically appears euvolemic. - 2D echo: LVEF 60-65%.  No regional wall motion abnormalities.  Grade 1 diastolic dysfunction.  No aortic stenosis.  Elevated CK - Mild rhabdomyolysis versus other etiologies i.e. polymyositis etc. - Repeat CK today.  Hold statins. - Autoimmune work-up as above.  May need other evaluation, probably as outpatient. -Continue gentle IV fluids for another 24 hours.  Abnormal LFTs - Mildly elevated AST and ALT may be related to elevated CK versus other etiologies.  No GI symptoms. - Hold statins for now  Hypokalemia - Replace and follow.  Check and replace magnesium as needed.  Profound weakness/functional quadriplegia/fall at home - CT head and C-spine negative in ED.  Skin rash - Unclear etiology but seems to be healing. - Outpatient rheumatology consultation versus dermatology evaluation.  Essential hypertension - Controlled on amlodipine and lisinopril.  Hyperlipidemia - Hold atorvastatin.  Depression - On bupropion and Lexapro, continue. - As per sister, patient reportedly sleeps a lot, has been drowsy which patient indicates was due to her muscle relaxants which are not continued in the hospital. - Monitor and could consider cutting back on above meds if somnolent.  Body mass index is 29.25 kg/m.    DVT prophylaxis: enoxaparin (LOVENOX) injection 40 mg Start: 03/26/21 2230  Code Status: Full Code Family Communication: Discussed in detail with patient's sister at bedside, updated care and answered all questions. Disposition:  Status is: Observation  The patient will require care spanning > 2 midnights and should be moved to inpatient because:  Rhabdomyolysis, profound weakness with extreme difficulty ambulating, need for continued IVF, lab evaluation as noted above and monitoring for improvement.       Consultants:   None  Procedures:   None  Antimicrobials:    Anti-infectives (From admission, onward)    None         Subjective:  Patient interviewed and examined along with sister at bedside.  History noted as above.  Feels somewhat better compared to admission.  Ambulated with the help of walker and two-person assist to the toilet.  Generalized weakness and pains of large joints, small joints and bony pains.  Denies fever or chills.  Mostly sedentary at home since the fall.  Saw her PCP 2 weeks prior to the fall for an episode of bronchitis but has not followed up since.  Objective:   Vitals:   03/27/21 0500 03/27/21 0502 03/27/21 0900 03/27/21 1300  BP:  114/75 132/84 126/75  Pulse:  (!) 102 (!) 108 100  Resp:  _0 Temp:  97.7 F (36.5 C) 97.7 F (36.5 C) 97.7 F (36.5 C)  TempSrc:  Oral Oral Oral  SpO2:   97% 98%  Weight: 77.3 kg     Height:        General exam: Middle-age female, moderately built and overweight sitting up comfortably in bed without distress.  Oral mucosa moist. Respiratory system: Clear to auscultation. Respiratory effort normal. Cardiovascular system: S1 & S2 heard, RRR. No JVD, murmurs, rubs, gallops or clicks. No pedal edema.  Telemetry personally reviewed: Low-voltage, sinus rhythm without arrhythmias. Gastrointestinal system: Abdomen is nondistended, soft and nontender. No organomegaly or masses felt. Normal bowel sounds heard. Central nervous system: Alert and oriented. No focal neurological deficits. Extremities: Proximally/bilateral shoulders and hips, grade 4+ by 5 power.  Distally normal power.  Bilateral fingers with some diffuse/boggy swelling without tenderness or increased warmth or redness.  Left hand worse than the right. Skin: No rashes, lesions or  ulcers Psychiatry: Judgement and insight appear normal. Mood & affect flat.     Data Reviewed:   I have personally reviewed following labs and imaging studies   CBC: Recent Labs  Lab 03/26/21 1712  WBC 6.4  NEUTROABS 5.7  HGB 14.5  HCT 42.6  MCV 91.6  PLT 245    Basic Metabolic Panel: Recent Labs  Lab 03/26/21 1712 03/27/21 0520  NA 137 134*  K 3.9 3.4*  CL 111 111  CO2 18* 19*  GLUCOSE 99 111*  BUN 20 14  CREATININE 1.03* 0.76  CALCIUM 9.3 8.2*    Liver Function Tests: Recent Labs  Lab 03/26/21 1712 03/27/21 0520  AST 161* 118*  ALT 114* 87*  ALKPHOS 80 64  BILITOT 1.2 0.9  PROT 7.6 6.2*  ALBUMIN 3.3* 2.6*    CBG: Recent Labs  Lab 03/26/21 1833 03/27/21 0458  GLUCAP 91 114*    Microbiology Studies:   Recent Results (from the past 240 hour(s))  Resp Panel by RT-PCR (Flu A&B, Covid) Nasopharyngeal Swab     Status: None   Collection Time: 03/26/21 10:14 PM   Specimen: Nasopharyngeal Swab; Nasopharyngeal(NP) swabs in vial transport medium  Result Value Ref Range Status   SARS Coronavirus 2 by RT PCR NEGATIVE NEGATIVE  Final    Comment: (NOTE) SARS-CoV-2 target nucleic acids are NOT DETECTED.  The SARS-CoV-2 RNA is generally detectable in upper respiratory specimens during the acute phase of infection. The lowest concentration of SARS-CoV-2 viral copies this assay can detect is 138 copies/mL. A negative result does not preclude SARS-Cov-2 infection and should not be used as the sole basis for treatment or other patient management decisions. A negative result may occur with  improper specimen collection/handling, submission of specimen other than nasopharyngeal swab, presence of viral mutation(s) within the areas targeted by this assay, and inadequate number of viral copies(<138 copies/mL). A negative result must be combined with clinical observations, patient history, and epidemiological information. The expected result is Negative.  Fact  Sheet for Patients:  EntrepreneurPulse.com.au  Fact Sheet for Healthcare Providers:  IncredibleEmployment.be  This test is no t yet approved or cleared by the [place] FDA and  has been authorized for detection and/or diagnosis of SARS-CoV-2 by FDA under an Emergency Use Authorization (EUA). This EUA will remain  in effect (meaning this test can be used) for the duration of the COVID-19 declaration under Section 564(b)(1) of the Act, 21 U.S.C.section 360bbb-3(b)(1), unless the authorization is terminated  or revoked sooner.       Influenza A by PCR NEGATIVE NEGATIVE Final   Influenza B by PCR NEGATIVE NEGATIVE Final    Comment: (NOTE) The Xpert Xpress SARS-CoV-2/FLU/RSV plus assay is intended as an aid in the diagnosis of influenza from Nasopharyngeal swab specimens and should not be used as a sole basis for treatment. Nasal washings and aspirates are unacceptable for Xpert Xpress SARS-CoV-2/FLU/RSV testing.  Fact Sheet for Patients: EntrepreneurPulse.com.au  Fact Sheet for Healthcare Providers: IncredibleEmployment.be  This test is not yet approved or cleared by the [place] FDA and has been authorized for detection and/or diagnosis of SARS-CoV-2 by FDA under an Emergency Use Authorization (EUA). This EUA will remain in effect (meaning this test can be used) for the duration of the COVID-19 declaration under Section 564(b)(1) of the Act, 21 U.S.C. section 360bbb-3(b)(1), unless the authorization is terminated or revoked.  Performed at [facility], [place] [address] [place], [place] [address]      Radiology Studies:  DG Chest 2 View  Result Date: 03/26/2021 CLINICAL DATA:  Chest pain and weakness. EXAM: CHEST - 2 VIEW COMPARISON:  Chest x-ray 02/29/2020. FINDINGS: There is linear atelectasis or scarring in the left lower lobe. The lungs are otherwise clear. There is no  pleural effusion or pneumothorax. The cardiomediastinal silhouette is within normal limits. No acute fractures are seen. IMPRESSION: No active cardiopulmonary disease. Electronically Signed   By: Ronney Asters M.D.   On: 03/26/2021 18:34   CT HEAD WO CONTRAST (5MM)  Result Date: 03/26/2021 CLINICAL DATA:  Fall 3 weeks ago. Headache, intracranial hemorrhage suspected EXAM: CT HEAD WITHOUT CONTRAST TECHNIQUE: Contiguous axial images were obtained from the base of the skull through the vertex without intravenous contrast. COMPARISON:  None. FINDINGS: Brain: No acute intracranial abnormality. Specifically, no hemorrhage, hydrocephalus, mass lesion, acute infarction, or significant intracranial injury. Vascular: No hyperdense vessel or unexpected calcification. Skull: No acute calvarial abnormality. Sinuses/Orbits: No acute findings Other: None IMPRESSION: No acute intracranial abnormality. Electronically Signed   By: Rolm Baptise M.D.   On: 03/26/2021 20:32   CT Cervical Spine Wo Contrast  Result Date: 03/26/2021 CLINICAL DATA:  Fall EXAM: CT CERVICAL SPINE WITHOUT CONTRAST TECHNIQUE: Multidetector CT imaging of the cervical spine was performed without intravenous contrast.  Multiplanar CT image reconstructions were also generated. COMPARISON:  None. FINDINGS: Alignment: Normal Skull base and vertebrae: No acute fracture. No primary bone lesion or focal pathologic process. Soft tissues and spinal canal: No prevertebral fluid or swelling. No visible canal hematoma. Disc levels: Mild diffuse degenerative disc disease with disc space narrowing and spurring. Mild diffuse bilateral degenerative facet disease. Upper chest: No acute findings Other: 89 IMPRESSION: No acute bony abnormality. Electronically Signed   By: Rolm Baptise M.D.   On: 03/26/2021 20:34   ECHOCARDIOGRAM COMPLETE  Result Date: 03/27/2021    ECHOCARDIOGRAM REPORT   Patient Name:   Carrie [name] Date of Exam: 03/27/2021 Medical Rec #:   [id number]      Height:       64.0 in Accession #:    [id number]     Weight:       170.4 lb Date of Birth:  02/02/1961       BSA:          1.828 m Patient Age:    35 years       BP:           114/75 mmHg Patient Gender: F              HR:           102 bpm. Exam Location:  Inpatient Procedure: 2D Echo, Cardiac Doppler, Color Doppler and Intracardiac            Opacification Agent Indications:    R55 Syncope  History:        Patient has prior history of Echocardiogram examinations, most                 recent 02/24/2017. Signs/Symptoms:Syncope and Chest Pain; Risk                 Factors:Dyslipidemia and Hypertension.  Sonographer:    Roseanna Rainbow RDCS Referring Phys: [id number] Alissa Pharr D Jovi Zavadil  Sonographer Comments: Technically difficult study due to poor echo windows. Image acquisition challenging due to respiratory motion. Images exytemely respiratory dependent IMPRESSIONS  1. Left ventricular ejection fraction, by estimation, is 60 to 65%. The left ventricle has normal function. The left ventricle has no regional wall motion abnormalities. Left ventricular diastolic parameters are consistent with Grade I diastolic dysfunction (impaired relaxation).  2. Right ventricular systolic function is normal. The right ventricular size is normal. Tricuspid regurgitation signal is inadequate for assessing PA pressure.  3. The mitral valve is normal in structure. No evidence of mitral valve regurgitation. No evidence of mitral stenosis.  4. The aortic valve is tricuspid. Aortic valve regurgitation is not visualized. No aortic stenosis is present.  5. The inferior vena cava is normal in size with greater than 50% respiratory variability, suggesting right atrial pressure of 3 mmHg. FINDINGS  Left Ventricle: Left ventricular ejection fraction, by estimation, is 60 to 65%. The left ventricle has normal function. The left ventricle has no regional wall motion abnormalities. Definity contrast agent was given IV to delineate the left  ventricular  endocardial borders. The left ventricular internal cavity size was normal in size. There is no left ventricular hypertrophy. Left ventricular diastolic parameters are consistent with Grade I diastolic dysfunction (impaired relaxation). Right Ventricle: The right ventricular size is normal. No increase in right ventricular wall thickness. Right ventricular systolic function is normal. Tricuspid regurgitation signal is inadequate for assessing PA pressure. Left Atrium: Left atrial size was normal in size. Right Atrium: Right atrial size was  normal in size. Pericardium: There is no evidence of pericardial effusion. Mitral Valve: The mitral valve is normal in structure. No evidence of mitral valve regurgitation. No evidence of mitral valve stenosis. Tricuspid Valve: The tricuspid valve is normal in structure. Tricuspid valve regurgitation is trivial. Aortic Valve: The aortic valve is tricuspid. Aortic valve regurgitation is not visualized. No aortic stenosis is present. Pulmonic Valve: The pulmonic valve was normal in structure. Pulmonic valve regurgitation is not visualized. Aorta: The aortic root is normal in size and structure. Venous: The inferior vena cava is normal in size with greater than 50% respiratory variability, suggesting right atrial pressure of 3 mmHg. IAS/Shunts: No atrial level shunt detected by color flow Doppler.  LEFT VENTRICLE PLAX 2D LVIDd:         3.90 cm     Diastology LVIDs:         2.40 cm     LV e' medial:    7.29 cm/s LV PW:         1.00 cm     LV E/e' medial:  8.0 LV IVS:        0.90 cm     LV e' lateral:   9.90 cm/s                            LV E/e' lateral: 5.9  LV Volumes (MOD) LV vol d, MOD A2C: 29.2 ml LV vol d, MOD A4C: 35.9 ml LV vol s, MOD A2C: 10.4 ml LV vol s, MOD A4C: 9.0 ml LV SV MOD A2C:     18.8 ml LV SV MOD A4C:     35.9 ml LV SV MOD BP:      22.6 ml RIGHT VENTRICLE             IVC RV S prime:     14.50 cm/s  IVC diam: 1.40 cm TAPSE (M-mode): 1.0 cm LEFT ATRIUM              Index       RIGHT ATRIUM          Index LA diam:        2.70 cm 1.48 cm/m  RA Area:     9.92 cm LA Vol (A2C):   16.0 ml 8.75 ml/m  RA Volume:   18.80 ml 10.29 ml/m LA Vol (A4C):   9.6 ml  5.25 ml/m LA Biplane Vol: 13.5 ml 7.39 ml/m  AORTIC VALVE LVOT Vmax:   93.10 cm/s LVOT Vmean:  58.700 cm/s LVOT VTI:    0.130 m  AORTA Ao Root diam: 2.70 cm Ao Asc diam:  3.10 cm MITRAL VALVE MV Area (PHT): 5.27 cm    SHUNTS MV Decel Time: 144 msec    Systemic VTI: 0.13 m MV E velocity: 58.20 cm/s MV A velocity: 94.60 cm/s MV E/A ratio:  0.62 Dalton McleanMD Electronically signed by Franki Monte Signature Date/Time: 03/27/2021/3:12:58 PM    Final      Scheduled Meds:    amLODipine  5 mg Oral Daily   atorvastatin  40 mg Oral Daily   buPROPion  300 mg Oral Daily   enoxaparin (LOVENOX) injection  40 mg Subcutaneous Q24H   escitalopram  20 mg Oral Daily   lisinopril  30 mg Oral Daily   pantoprazole  40 mg Oral Daily   sodium chloride flush  3 mL Intravenous Q12H    Continuous Infusions:     LOS: 0  days     Vernell Leep, MD, [place], [facility]. [facility]    To contact the attending provider between 7A-7P or the covering provider during after hours 7P-7A, please log into the web site [email or web address] and access using universal [place] password for that web site. If you do not have the password, please call the hospital operator.  03/27/2021, 3:16 PM

## 2021-03-27 NOTE — Progress Notes (Signed)
  Echocardiogram 2D Echocardiogram has been performed.  Janalyn Harder 03/27/2021, 8:59 AM

## 2021-03-27 NOTE — Progress Notes (Signed)
Mobility Specialist - Progress Note    03/27/21 1534  Mobility  Activity Turned to right side;Turned to left side;Turned to back (supine)  Range of Motion/Exercises All extremities  Level of Assistance Minimal assist, patient does 75% or more  Assistive Device None  Mobility Sit up in bed/chair position for meals  Mobility Response Tolerated well  Mobility performed by Mobility specialist  $Mobility charge 1 Mobility   Upon entry pt was agreeable to ambulate, but stated feeling very sore and was experiencing abdominal pain. Pt agreed to complete bed exercises. Pt completed 2 sets of 10 bilateral ankle pumps, heel slides, and leg lifts. Pt encouraged to roll on each side and completed 10 arm raises while laying supine. Pt stated feeling fatigued after session, but enjoyed the encouragement for mobility. Pt was left in bed with call bell at side.   Arliss Journey Mobility Specialist Acute Rehabilitation Services Phone: [phone] 03/27/21, 3:42 PM

## 2021-03-27 NOTE — Plan of Care (Signed)
  Problem: Education: Goal: Knowledge of General Education information will improve Description: Including pain rating scale, medication(s)/side effects and non-pharmacologic comfort measures Outcome: Progressing   Problem: Health Behavior/Discharge Planning: Goal: Ability to manage health-related needs will improve Outcome: Progressing   Problem: Clinical Measurements: Goal: Ability to maintain clinical measurements within normal limits will improve Outcome: Progressing Goal: Will remain free from infection Outcome: Progressing Goal: Diagnostic test results will improve Outcome: Progressing Goal: Cardiovascular complication will be avoided Outcome: Progressing   Problem: Activity: Goal: Risk for activity intolerance will decrease Outcome: Progressing   Problem: Nutrition: Goal: Adequate nutrition will be maintained Outcome: Progressing   Problem: Pain Managment: Goal: General experience of comfort will improve Outcome: Progressing   Problem: Safety: Goal: Ability to remain free from injury will improve Outcome: Progressing   Problem: Skin Integrity: Goal: Risk for impaired skin integrity will decrease Outcome: Progressing   

## 2021-03-27 NOTE — Telephone Encounter (Signed)
Received call from [facility] at [facility]. Needing clarification which statin Pt is supposed to be on. Med list is atorvastatin 40mg  (Pt currently in hospital and has been getting this medication as well).

## 2021-03-28 LAB — RHEUMATOID FACTOR: Rheumatoid fact SerPl-aCnc: 101.4 IU/mL — ABNORMAL HIGH (ref <14.0)

## 2021-03-28 LAB — ENA+DNA/DS+ANTICH+CENTRO+JO...
Anti JO-1: <0.2 AI (ref 0.0–0.9)
Centromere Ab Screen: <0.2 AI (ref 0.0–0.9)
Chromatin Ab SerPl-aCnc: <0.2 AI (ref 0.0–0.9)
ENA SM Ab Ser-aCnc: <0.2 AI (ref 0.0–0.9)
Ribonucleic Protein: 1.5 AI — ABNORMAL HIGH (ref 0.0–0.9)
SSA (Ro) (ENA) Antibody, IgG: 0.2 AI (ref 0.0–0.9)
SSB (La) (ENA) Antibody, IgG: <0.2 AI (ref 0.0–0.9)
Scleroderma (Scl-70) (ENA) Antibody, IgG: <0.2 AI (ref 0.0–0.9)
ds DNA Ab: <1 IU/mL (ref 0–9)

## 2021-03-28 LAB — COMPREHENSIVE METABOLIC PANEL
ALT: 77 U/L — ABNORMAL HIGH (ref 0–44)
AST: 107 U/L — ABNORMAL HIGH (ref 15–41)
Albumin: 2.6 g/dL — ABNORMAL LOW (ref 3.5–5.0)
Alkaline Phosphatase: 63 U/L (ref 38–126)
Anion gap: 6 (ref 5–15)
BUN: 9 mg/dL (ref 8–23)
CO2: 18 mmol/L — ABNORMAL LOW (ref 22–32)
Calcium: 8.5 mg/dL — ABNORMAL LOW (ref 8.9–10.3)
Chloride: 112 mmol/L — ABNORMAL HIGH (ref 98–111)
Creatinine, Ser: 0.8 mg/dL (ref 0.44–1.00)
GFR, Estimated: >60 mL/min (ref >60)
Glucose, Bld: 104 mg/dL — ABNORMAL HIGH (ref 70–99)
Potassium: 4.4 mmol/L (ref 3.5–5.1)
Sodium: 136 mmol/L (ref 135–145)
Total Bilirubin: 0.8 mg/dL (ref 0.3–1.2)
Total Protein: 6.3 g/dL — ABNORMAL LOW (ref 6.5–8.1)

## 2021-03-28 LAB — GLUCOSE, CAPILLARY: Glucose-Capillary: 91 mg/dL (ref 70–99)

## 2021-03-28 LAB — ANA W/REFLEX IF POSITIVE: Anti Nuclear Antibody (ANA): POSITIVE — AB

## 2021-03-28 MED ORDER — PREDNISONE 20 MG PO TABS
20.0000 mg | ORAL_TABLET | Freq: Every day | ORAL | Status: DC
  Administered 2021-03-29 – 2021-04-04 (×7): 20 mg via ORAL
  Filled 2021-03-28 (×8): qty 1

## 2021-03-28 NOTE — Evaluation (Signed)
Physical Therapy Evaluation Patient Details Name: Carrie [name] MRN: [id number] DOB: Mar 29, 1961 Today's Date: 03/28/2021  History of Present Illness  60 year old female, lives alone, medical history significant for hypertension, hyperlipidemia, depression, sustained a mechanical fall at home on 02/01/2021 after missing a step.  Patient  has continued to feel progressively weak, generalized pains, dizzy without vertigo, no syncope or near syncope and presented to the ED 03/26/21.Admitted for presyncope, dehydration       and generalized weakness.  Rheumatological work-up initiated.  Rheumatoid factor markedly elevated.  Initiated prednisone  Clinical Impression  Patient presents with general malaise, decreased efforts and stating" I just want to sleep.'  With encouragement, patient st up and ambulated x 20 x'2 with RW. Generally weak and sluggish.  At baseline, patient independent and drives, pare Sister Dois Davenport, Primary contact, patient has bee  weak ans sleeping at lot at home for a few weeks.    Patient lives alone and may benefit from Rehab at SNF if continues to demonstrate weakness and  inability to live alone.  Pt admitted with above diagnosis.  Pt currently with functional limitations due to the deficits listed below (see PT Problem List). Pt will benefit from skilled PT to increase their independence and safety with mobility to allow discharge to the venue listed below.        Recommendations for follow up therapy are one component of a multi-disciplinary discharge planning process, led by the attending physician.  Recommendations may be updated based on patient status, additional functional criteria and insurance authorization.  Follow Up Recommendations Home health PT;SNF    Equipment Recommendations   (TBA)    Recommendations for Other Services   OT    Precautions / Restrictions Precautions Precautions: Fall      Mobility  Bed Mobility Overal bed mobility: Needs  Assistance Bed Mobility: Supine to Sit;Sit to Supine     Supine to sit: Min assist Sit to supine: Mod assist   General bed mobility comments: patient presents with decreased effort, assist gor legs  onto bed after  attempted x 3 and could  not get legs onto bed.    Transfers Overall transfer level: Needs assistance   Transfers: Sit to/from Stand Sit to Stand: Min assist         General transfer comment: steady assist to rise. drops down into recliner, decreased  control  Ambulation/Gait Ambulation/Gait assistance: Min assist Gait Distance (Feet): 20 Feet (x2) Assistive device: Rolling walker (2 wheeled) Gait Pattern/deviations: Step-to pattern;Drifts right/left Gait velocity: decr   General Gait Details: gait slow/sluggish.  Stairs            Wheelchair Mobility    Modified Rankin (Stroke Patients Only)       Balance Overall balance assessment: History of Falls;Needs assistance Sitting-balance support: No upper extremity supported;Feet supported Sitting balance-Leahy Scale: Good     Standing balance support: During functional activity;No upper extremity supported Standing balance-Leahy Scale: Poor                               Pertinent Vitals/Pain Pain Assessment: No/denies pain    Home Living Family/patient expects to be discharged to:: Private residence Living Arrangements: Alone Available Help at Discharge: Family;Available PRN/intermittently Type of Home: House Home Access: Stairs to enter   Entrance Stairs-Number of Steps: 2 Home Layout: Two level Home Equipment: None      Prior Function Level of Independence: Independent  Comments: has been driving,     Hand Dominance        Extremity/Trunk Assessment   Upper Extremity Assessment Upper Extremity Assessment: Overall WFL for tasks assessed    Lower Extremity Assessment Lower Extremity Assessment: Generalized weakness    Cervical / Trunk  Assessment Cervical / Trunk Assessment: Normal  Communication   Communication: No difficulties  Cognition Arousal/Alertness: Lethargic Behavior During Therapy: WFL for tasks assessed/performed Overall Cognitive Status: Impaired/Different from baseline Area of Impairment: Orientation                 Orientation Level: Time;Situation             General Comments: patient generally lethargic ant states" I am so sleepy"      General Comments      Exercises     Assessment/Plan    PT Assessment Patient needs continued PT services  PT Problem List Decreased strength;Decreased mobility;Decreased safety awareness;Decreased activity tolerance;Decreased cognition;Decreased balance       PT Treatment Interventions DME instruction;Therapeutic activities;Gait training;Therapeutic exercise;Patient/family education;Stair training;Functional mobility training    PT Goals (Current goals can be found in the Care Plan section)  Acute Rehab PT Goals Patient Stated Goal: to sleep PT Goal Formulation: With patient/family Time For Goal Achievement: 04/11/21 Potential to Achieve Goals: Good    Frequency Min 3X/week   Barriers to discharge Decreased caregiver support      Co-evaluation               AM-PAC PT "6 Clicks" Mobility  Outcome Measure Help needed turning from your back to your side while in a flat bed without using bedrails?: A Little Help needed moving from lying on your back to sitting on the side of a flat bed without using bedrails?: A Little Help needed moving to and from a bed to a chair (including a wheelchair)?: A Lot Help needed standing up from a chair using your arms (e.g., wheelchair or bedside chair)?: A Lot Help needed to walk in hospital room?: A Lot Help needed climbing 3-5 steps with a railing? : Total 6 Click Score: 13    End of Session Equipment Utilized During Treatment: Gait belt Activity Tolerance: Patient limited by fatigue Patient  left: in bed;with call bell/phone within reach;with bed alarm set;with family/visitor present Nurse Communication: Mobility status PT Visit Diagnosis: Unsteadiness on feet (R26.81);History of falling (Z91.81)    Time: 5974-1638 PT Time Calculation (min) (ACUTE ONLY): 32 min   Charges:   PT Evaluation $PT Eval Low Complexity: 1 Low PT Treatments $Gait Training: 8-22 mins        Blanchard Kelch PT Acute Rehabilitation Services Pager [phone] Office [phone]   [name] 03/28/2021, 4:53 PM

## 2021-03-28 NOTE — Progress Notes (Signed)
PROGRESS NOTE   Carrie [name]  XBJ:[id number]    DOB: 02-23-1961    DOA: 03/26/2021  PCP: Ann Held, DO   I have briefly reviewed patients previous medical records in [facility].  Chief Complaint  Patient presents with   Fall    Brief Narrative:  60 year old female, lives alone, medical history significant for hypertension, hyperlipidemia, depression, sustained a mechanical fall at home on 02/01/2021 after missing a step.  She had no LOC.  She did not seek immediate medical attention.  However over the next 2 weeks, she noted progressive generalized weakness and pain for which she was seen at an urgent care and prescribed muscle relaxants and NSAIDs with some relief.  However since then, she has continued to feel progressively weak, generalized pains, dizzy without vertigo, no syncope or near syncope and presented to the ED.  She has some rash over the upper chest which preceded her fall.  Initially admitted for presyncope, dehydration and generalized weakness.  Rheumatological work-up initiated.  Rheumatoid factor markedly elevated.  Initiated prednisone.   Assessment & Plan:  Principal Problem:   Pre-syncope Active Problems:   Hyperlipidemia LDL goal <100   Depression, major, single episode, moderate (HCC)   Generalized weakness   Joint pain   Non-traumatic rhabdomyolysis   Generalized pain/arthralgia and profound weakness/?  Rheumatoid arthritis: - Unclear etiology.  Although patient attributes it all to her fall, that was almost 2 months ago and she has only progressively gotten weaker.   - CK is mildly elevated.  She does have a almost healed mid upper anterior chest skin lesion which does not clearly seem like an autoimmune type but uncertain.  I do not appreciate a malar rash. - Autoimmune etiology is of concern. - Discussed with Dr. Kathlene November, rheumatology on the phone on 10/20.  As per recommendations, checked following: Ordered ESR: 26, CRP: 2.5, RA: 101.4  (markedly elevated), ANA (pending), CCP (pending). - Discussed with him again on the phone with updated results as above on 10/21.  He is concerned about rheumatoid arthritis.  Given her profound weakness and ongoing diffuse pains and finger swelling-inability to use, recommend starting prednisone 20 Mg daily x1 week then taper over the next few days to zero around 4 to 5 days before office visit with him. - Given elevated CK, hold statins for now.  Dizziness/?  Presyncope - Unclear etiology. - Orthostatic vitals checked and negative. - TSH normal.  Telemetry shows low voltage but normal sinus rhythm without arrhythmias or pauses. - Some of this could have been due to dehydration, received IV fluids, clinically appears euvolemic. - 2D echo: LVEF 60-65%.  No regional wall motion abnormalities.  Grade 1 diastolic dysfunction.  No aortic stenosis.  Elevated CK - Mild rhabdomyolysis versus other etiologies i.e. polymyositis etc. - CK down to 484.  Hold statins. - Autoimmune work-up as above.  May need other evaluation, probably as outpatient. - Completed IV fluids.  Abnormal LFTs - Mildly elevated AST and ALT may be related to elevated CK versus other etiologies.  No GI symptoms. - Hold statins for now. - Slightly better.  Hypokalemia - Replaced.  Magnesium 2.  Profound weakness/functional quadriplegia/fall at home - CT head and C-spine negative in ED. - Awaiting PT and OT evaluation.  It is possible that she may need SNF for short-term rehab.  Does not have 24/7 assistance at home.  Skin rash - Unclear etiology but seems to be healing. - Outpatient rheumatology consultation versus  dermatology evaluation.  Essential hypertension - Controlled on amlodipine and lisinopril.  Hyperlipidemia - Hold atorvastatin.  Depression - On bupropion and Lexapro, continue. - As per sister, patient reportedly sleeps a lot, has been drowsy which patient indicates was due to her muscle relaxants  which are not continued in the hospital. - Monitor and could consider cutting back on above meds if somnolent.  Confusion/?  Cognitive impairment - Nursing reports some confusion through the night. - Patient's neck sister who is a retired PA indicates that she has been having some confusion even prior to admission. - Recommend outpatient neuropsychiatric consultation. - TSH normal.  B12 levels high.  We will check RPR.  Body mass index is 30.35 kg/m.    DVT prophylaxis: enoxaparin (LOVENOX) injection 40 mg Start: 03/26/21 2230     Code Status: Full Code Family Communication: Discussed in detail with patient's next sister who is a retired PA at bedside, updated care and answered all questions. Disposition:  Status is: Inpatient        Consultants:   None  Procedures:   None  Antimicrobials:    Anti-infectives (From admission, onward)    None         Subjective:  As per RN, some confusion over the course of the night.  Patient feels somewhat stronger which is confirmed by sister at bedside.  Still required assistance of walker and sister to walk to the bathroom.  Pains are better.  Objective:   Vitals:   03/27/21 1300 03/27/21 2055 03/28/21 0500 03/28/21 0836  BP: 126/75 122/86  (!) 137/118  Pulse: 100 (!) 101  96  Resp: $Remo'20 19  19  'WqmmW$ Temp: 97.7 F (36.5 C) 98.2 F (36.8 C)    TempSrc: Oral     SpO2: 98% 96%  98%  Weight:   80.2 kg   Height:        General exam: Middle-age female, moderately built and overweight sitting up comfortably in bed without distress.  Oral mucosa moist. Respiratory system: Clear to auscultation.  No increased work of breathing. Cardiovascular system: S1 and S2 heard, RRR.  No JVD, murmurs or pedal edema.  Telemetry personally reviewed and shows sinus rhythm without arrhythmias. Gastrointestinal system: Abdomen is nondistended, soft and nontender. No organomegaly or masses felt. Normal bowel sounds heard. Central nervous system:  Alert and oriented. No focal neurological deficits. Extremities: Proximally/bilateral shoulders and hips, grade 4+ by 5 power.  Distally normal power.  Bilateral fingers with some diffuse/boggy swelling without tenderness or increased warmth or redness.  Left hand worse than the right. Skin: No rashes, lesions or ulcers Psychiatry: Judgement and insight appear normal. Mood & affect flat.     Data Reviewed:   I have personally reviewed following labs and imaging studies   CBC: Recent Labs  Lab 03/26/21 1712  WBC 6.4  NEUTROABS 5.7  HGB 14.5  HCT 42.6  MCV 91.6  PLT 836    Basic Metabolic Panel: Recent Labs  Lab 03/26/21 1712 03/27/21 0520 03/27/21 1627 03/28/21 0454  NA 137 134*  --  136  K 3.9 3.4*  --  4.4  CL 111 111  --  112*  CO2 18* 19*  --  18*  GLUCOSE 99 111*  --  104*  BUN 20 14  --  9  CREATININE 1.03* 0.76  --  0.80  CALCIUM 9.3 8.2*  --  8.5*  MG  --   --  2.0  --     Liver  Function Tests: Recent Labs  Lab 03/26/21 1712 03/27/21 0520 03/28/21 0454  AST 161* 118* 107*  ALT 114* 87* 77*  ALKPHOS 80 64 63  BILITOT 1.2 0.9 0.8  PROT 7.6 6.2* 6.3*  ALBUMIN 3.3* 2.6* 2.6*    CBG: Recent Labs  Lab 03/26/21 1833 03/27/21 0458 03/28/21 0505  GLUCAP 91 114* 91    Microbiology Studies:   Recent Results (from the past 240 hour(s))  Resp Panel by RT-PCR (Flu A&B, Covid) Nasopharyngeal Swab     Status: None   Collection Time: 03/26/21 10:14 PM   Specimen: Nasopharyngeal Swab; Nasopharyngeal(NP) swabs in vial transport medium  Result Value Ref Range Status   SARS Coronavirus 2 by RT PCR NEGATIVE NEGATIVE Final    Comment: (NOTE) SARS-CoV-2 target nucleic acids are NOT DETECTED.  The SARS-CoV-2 RNA is generally detectable in upper respiratory specimens during the acute phase of infection. The lowest concentration of SARS-CoV-2 viral copies this assay can detect is 138 copies/mL. A negative result does not preclude SARS-Cov-2 infection and  should not be used as the sole basis for treatment or other patient management decisions. A negative result may occur with  improper specimen collection/handling, submission of specimen other than nasopharyngeal swab, presence of viral mutation(s) within the areas targeted by this assay, and inadequate number of viral copies(<138 copies/mL). A negative result must be combined with clinical observations, patient history, and epidemiological information. The expected result is Negative.  Fact Sheet for Patients:  EntrepreneurPulse.com.au  Fact Sheet for Healthcare Providers:  IncredibleEmployment.be  This test is no t yet approved or cleared by the [place] FDA and  has been authorized for detection and/or diagnosis of SARS-CoV-2 by FDA under an Emergency Use Authorization (EUA). This EUA will remain  in effect (meaning this test can be used) for the duration of the COVID-19 declaration under Section 564(b)(1) of the Act, 21 U.S.C.section 360bbb-3(b)(1), unless the authorization is terminated  or revoked sooner.       Influenza A by PCR NEGATIVE NEGATIVE Final   Influenza B by PCR NEGATIVE NEGATIVE Final    Comment: (NOTE) The Xpert Xpress SARS-CoV-2/FLU/RSV plus assay is intended as an aid in the diagnosis of influenza from Nasopharyngeal swab specimens and should not be used as a sole basis for treatment. Nasal washings and aspirates are unacceptable for Xpert Xpress SARS-CoV-2/FLU/RSV testing.  Fact Sheet for Patients: EntrepreneurPulse.com.au  Fact Sheet for Healthcare Providers: IncredibleEmployment.be  This test is not yet approved or cleared by the [place] FDA and has been authorized for detection and/or diagnosis of SARS-CoV-2 by FDA under an Emergency Use Authorization (EUA). This EUA will remain in effect (meaning this test can be used) for the duration of the COVID-19 declaration  under Section 564(b)(1) of the Act, 21 U.S.C. section 360bbb-3(b)(1), unless the authorization is terminated or revoked.  Performed at [facility], [place] [address] [place], [place] [address]      Radiology Studies:  DG Chest 2 View  Result Date: 03/26/2021 CLINICAL DATA:  Chest pain and weakness. EXAM: CHEST - 2 VIEW COMPARISON:  Chest x-ray 02/29/2020. FINDINGS: There is linear atelectasis or scarring in the left lower lobe. The lungs are otherwise clear. There is no pleural effusion or pneumothorax. The cardiomediastinal silhouette is within normal limits. No acute fractures are seen. IMPRESSION: No active cardiopulmonary disease. Electronically Signed   By: Ronney Asters M.D.   On: 03/26/2021 18:34   CT HEAD WO CONTRAST (5MM)  Result Date: 03/26/2021 CLINICAL  DATA:  Fall 3 weeks ago. Headache, intracranial hemorrhage suspected EXAM: CT HEAD WITHOUT CONTRAST TECHNIQUE: Contiguous axial images were obtained from the base of the skull through the vertex without intravenous contrast. COMPARISON:  None. FINDINGS: Brain: No acute intracranial abnormality. Specifically, no hemorrhage, hydrocephalus, mass lesion, acute infarction, or significant intracranial injury. Vascular: No hyperdense vessel or unexpected calcification. Skull: No acute calvarial abnormality. Sinuses/Orbits: No acute findings Other: None IMPRESSION: No acute intracranial abnormality. Electronically Signed   By: Rolm Baptise M.D.   On: 03/26/2021 20:32   CT Cervical Spine Wo Contrast  Result Date: 03/26/2021 CLINICAL DATA:  Fall EXAM: CT CERVICAL SPINE WITHOUT CONTRAST TECHNIQUE: Multidetector CT imaging of the cervical spine was performed without intravenous contrast. Multiplanar CT image reconstructions were also generated. COMPARISON:  None. FINDINGS: Alignment: Normal Skull base and vertebrae: No acute fracture. No primary bone lesion or focal pathologic process. Soft tissues and spinal canal: No  prevertebral fluid or swelling. No visible canal hematoma. Disc levels: Mild diffuse degenerative disc disease with disc space narrowing and spurring. Mild diffuse bilateral degenerative facet disease. Upper chest: No acute findings Other: 89 IMPRESSION: No acute bony abnormality. Electronically Signed   By: Rolm Baptise M.D.   On: 03/26/2021 20:34   ECHOCARDIOGRAM COMPLETE  Result Date: 03/27/2021    ECHOCARDIOGRAM REPORT   Patient Name:   Carrie [name] Date of Exam: 03/27/2021 Medical Rec #:  [id number]      Height:       64.0 in Accession #:    [id number]     Weight:       170.4 lb Date of Birth:  1960/12/24       BSA:          1.828 m Patient Age:    22 years       BP:           114/75 mmHg Patient Gender: F              HR:           102 bpm. Exam Location:  Inpatient Procedure: 2D Echo, Cardiac Doppler, Color Doppler and Intracardiac            Opacification Agent Indications:    R55 Syncope  History:        Patient has prior history of Echocardiogram examinations, most                 recent 02/24/2017. Signs/Symptoms:Syncope and Chest Pain; Risk                 Factors:Dyslipidemia and Hypertension.  Sonographer:    Roseanna Rainbow RDCS Referring Phys: [id number] Bakari Nikolai D Yissel Habermehl  Sonographer Comments: Technically difficult study due to poor echo windows. Image acquisition challenging due to respiratory motion. Images exytemely respiratory dependent IMPRESSIONS  1. Left ventricular ejection fraction, by estimation, is 60 to 65%. The left ventricle has normal function. The left ventricle has no regional wall motion abnormalities. Left ventricular diastolic parameters are consistent with Grade I diastolic dysfunction (impaired relaxation).  2. Right ventricular systolic function is normal. The right ventricular size is normal. Tricuspid regurgitation signal is inadequate for assessing PA pressure.  3. The mitral valve is normal in structure. No evidence of mitral valve regurgitation. No evidence of mitral stenosis.  4.  The aortic valve is tricuspid. Aortic valve regurgitation is not visualized. No aortic stenosis is present.  5. The inferior vena cava is normal in size with greater than 50% respiratory  variability, suggesting right atrial pressure of 3 mmHg. FINDINGS  Left Ventricle: Left ventricular ejection fraction, by estimation, is 60 to 65%. The left ventricle has normal function. The left ventricle has no regional wall motion abnormalities. Definity contrast agent was given IV to delineate the left ventricular  endocardial borders. The left ventricular internal cavity size was normal in size. There is no left ventricular hypertrophy. Left ventricular diastolic parameters are consistent with Grade I diastolic dysfunction (impaired relaxation). Right Ventricle: The right ventricular size is normal. No increase in right ventricular wall thickness. Right ventricular systolic function is normal. Tricuspid regurgitation signal is inadequate for assessing PA pressure. Left Atrium: Left atrial size was normal in size. Right Atrium: Right atrial size was normal in size. Pericardium: There is no evidence of pericardial effusion. Mitral Valve: The mitral valve is normal in structure. No evidence of mitral valve regurgitation. No evidence of mitral valve stenosis. Tricuspid Valve: The tricuspid valve is normal in structure. Tricuspid valve regurgitation is trivial. Aortic Valve: The aortic valve is tricuspid. Aortic valve regurgitation is not visualized. No aortic stenosis is present. Pulmonic Valve: The pulmonic valve was normal in structure. Pulmonic valve regurgitation is not visualized. Aorta: The aortic root is normal in size and structure. Venous: The inferior vena cava is normal in size with greater than 50% respiratory variability, suggesting right atrial pressure of 3 mmHg. IAS/Shunts: No atrial level shunt detected by color flow Doppler.  LEFT VENTRICLE PLAX 2D LVIDd:         3.90 cm     Diastology LVIDs:         2.40 cm      LV e' medial:    7.29 cm/s LV PW:         1.00 cm     LV E/e' medial:  8.0 LV IVS:        0.90 cm     LV e' lateral:   9.90 cm/s                            LV E/e' lateral: 5.9  LV Volumes (MOD) LV vol d, MOD A2C: 29.2 ml LV vol d, MOD A4C: 35.9 ml LV vol s, MOD A2C: 10.4 ml LV vol s, MOD A4C: 9.0 ml LV SV MOD A2C:     18.8 ml LV SV MOD A4C:     35.9 ml LV SV MOD BP:      22.6 ml RIGHT VENTRICLE             IVC RV S prime:     14.50 cm/s  IVC diam: 1.40 cm TAPSE (M-mode): 1.0 cm LEFT ATRIUM             Index       RIGHT ATRIUM          Index LA diam:        2.70 cm 1.48 cm/m  RA Area:     9.92 cm LA Vol (A2C):   16.0 ml 8.75 ml/m  RA Volume:   18.80 ml 10.29 ml/m LA Vol (A4C):   9.6 ml  5.25 ml/m LA Biplane Vol: 13.5 ml 7.39 ml/m  AORTIC VALVE LVOT Vmax:   93.10 cm/s LVOT Vmean:  58.700 cm/s LVOT VTI:    0.130 m  AORTA Ao Root diam: 2.70 cm Ao Asc diam:  3.10 cm MITRAL VALVE MV Area (PHT): 5.27 cm    SHUNTS MV Decel Time: 144  msec    Systemic VTI: 0.13 m MV E velocity: 58.20 cm/s MV A velocity: 94.60 cm/s MV E/A ratio:  0.62 Dalton McleanMD Electronically signed by Franki Monte Signature Date/Time: 03/27/2021/3:12:58 PM    Final      Scheduled Meds:    amLODipine  5 mg Oral Daily   buPROPion  300 mg Oral Daily   enoxaparin (LOVENOX) injection  40 mg Subcutaneous Q24H   escitalopram  20 mg Oral Daily   lisinopril  30 mg Oral Daily   pantoprazole  40 mg Oral Daily   sodium chloride flush  3 mL Intravenous Q12H    Continuous Infusions:     LOS: 1 day     Vernell Leep, MD, FACP, [facility]. [facility]    To contact the attending provider between 7A-7P or the covering provider during after hours 7P-7A, please log into the web site [email or web address] and access using universal [place] password for that web site. If you do not have the password, please call the hospital operator.  03/28/2021, 1:53 PM

## 2021-03-29 ENCOUNTER — Inpatient Hospital Stay (HOSPITAL_COMMUNITY): Payer: Federal, State, Local not specified - PPO

## 2021-03-29 LAB — GLUCOSE, CAPILLARY: Glucose-Capillary: 103 mg/dL — ABNORMAL HIGH (ref 70–99)

## 2021-03-29 LAB — RPR: RPR Ser Ql: NONREACTIVE

## 2021-03-29 LAB — CYCLIC CITRUL PEPTIDE ANTIBODY, IGG/IGA: CCP Antibodies IgG/IgA: 13 units (ref 0–19)

## 2021-03-29 IMAGING — DX DG KNEE COMPLETE 4+V*L*
4 series · 4 of 4 positions shown · non-contrast
Comparison: None.

CLINICAL DATA: Fall 3 weeks ago with ongoing pain in the left hip
and knee.

EXAM:
LEFT KNEE - COMPLETE 4+ VIEW

[knee ap]
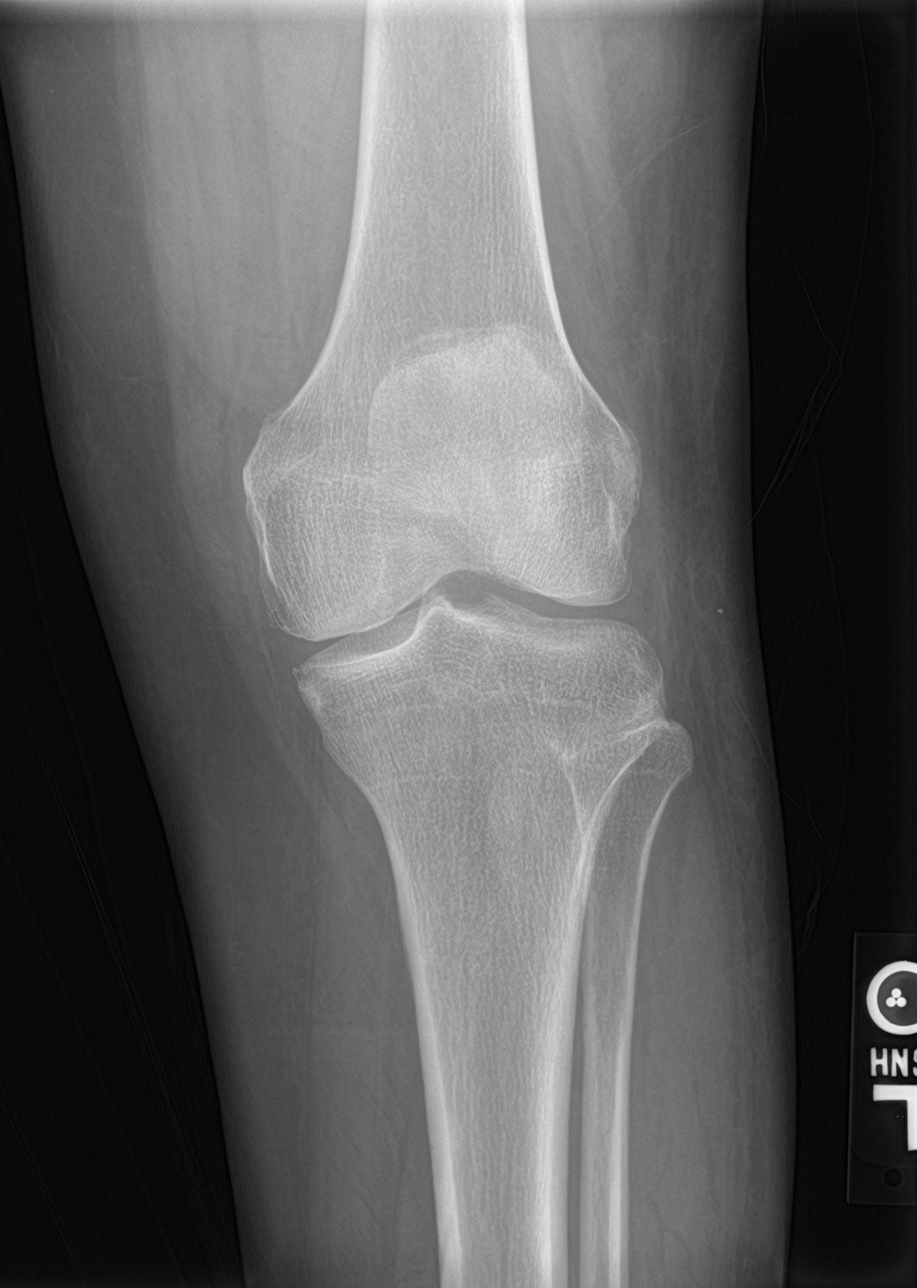

[knee lat]
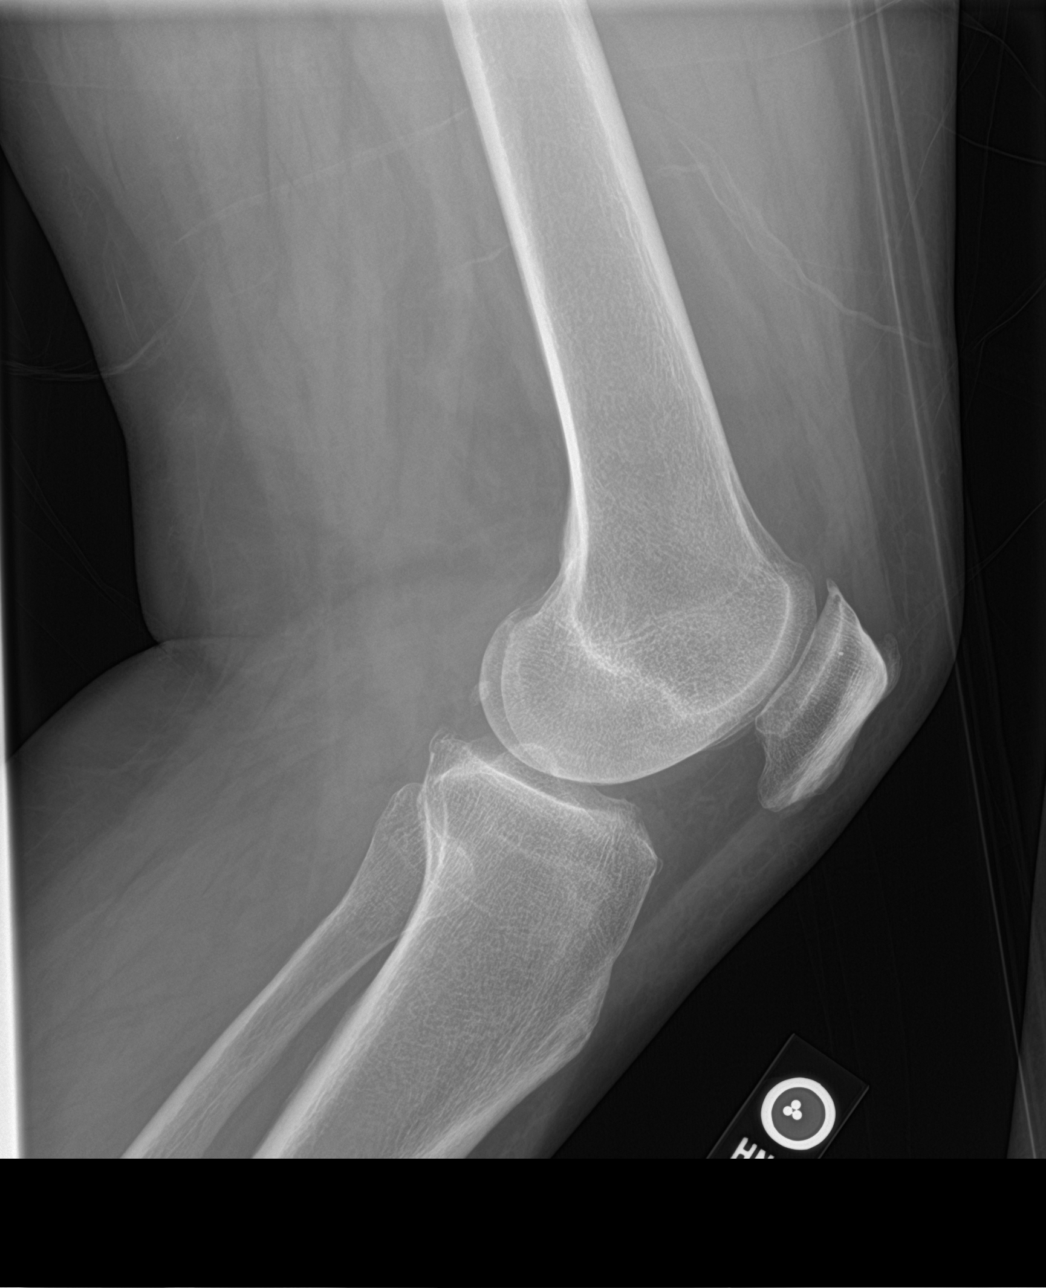

[knee obl (1 of 2)]
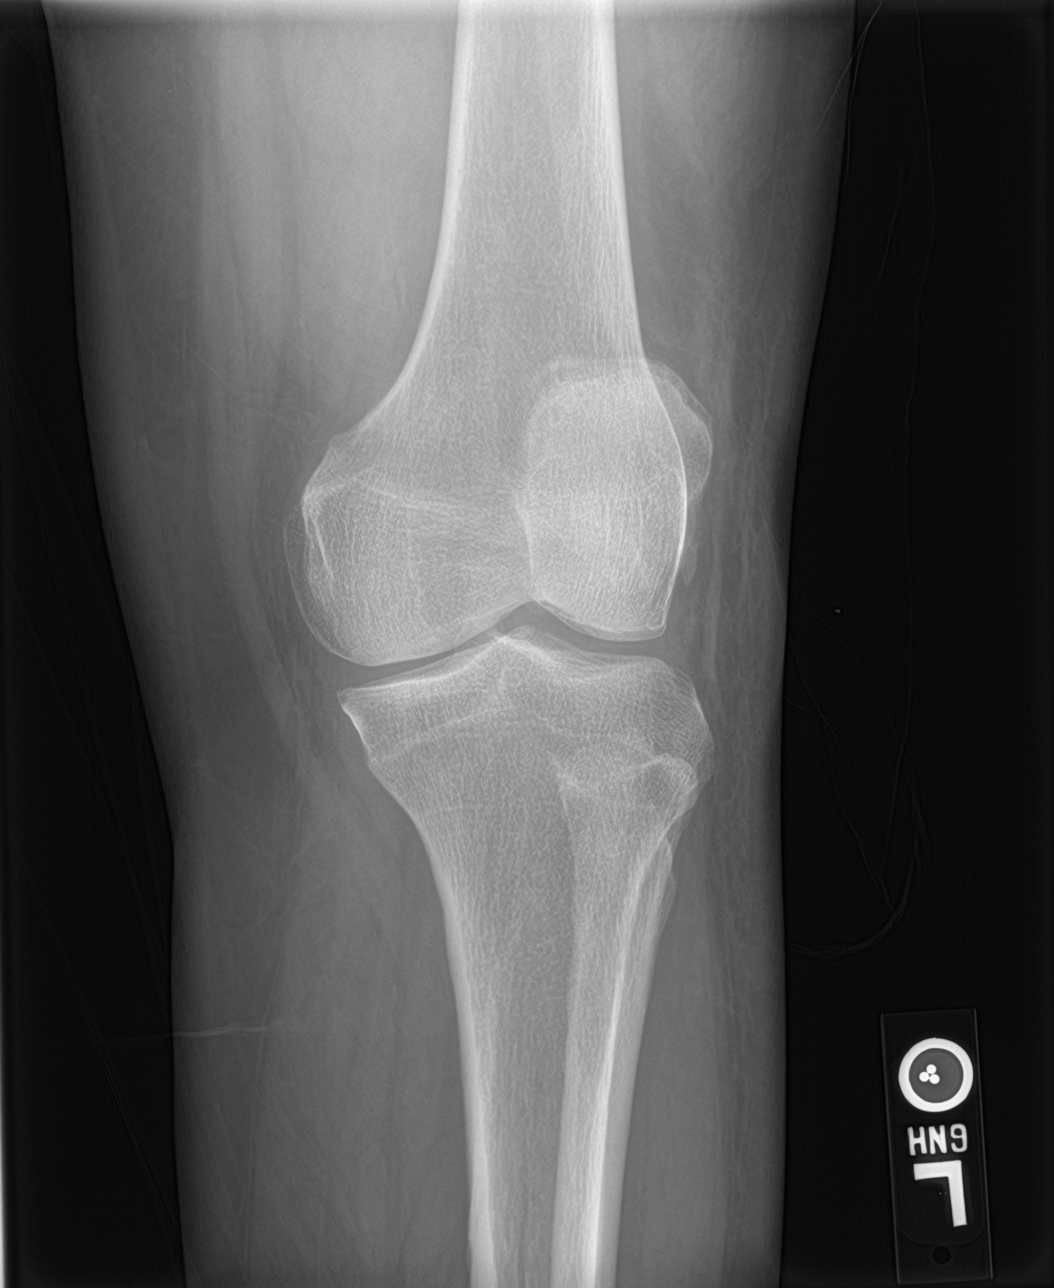

[knee obl (2 of 2)]
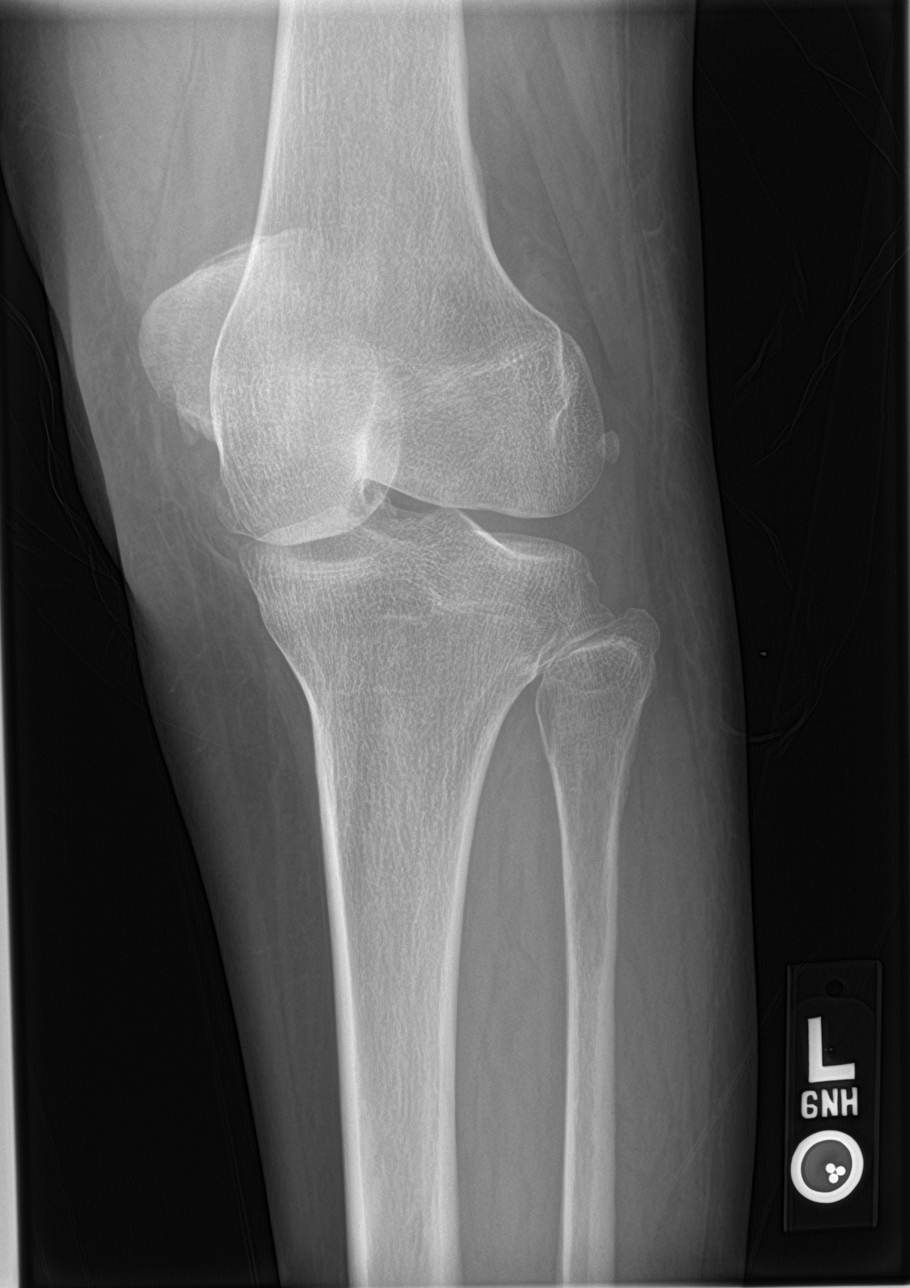

[4 of 4 positions shown; findings below may reference images not displayed]

FINDINGS: No evidence of fracture, dislocation, or joint effusion. There is
mild osteoarthritis in the medial and patellofemoral compartments.
The soft tissues appear normal.
IMPRESSION: No acute osseous injury.

## 2021-03-29 IMAGING — DX DG HIP (WITH OR WITHOUT PELVIS) 2-3V*L*
3 series · 3 of 3 positions shown · non-contrast
Comparison: None.

CLINICAL DATA: Fall 3 weeks ago with ongoing pain in the left hip
and knee.

EXAM:
DG HIP (WITH OR WITHOUT PELVIS) 2-3V LEFT

[pelvis ap]
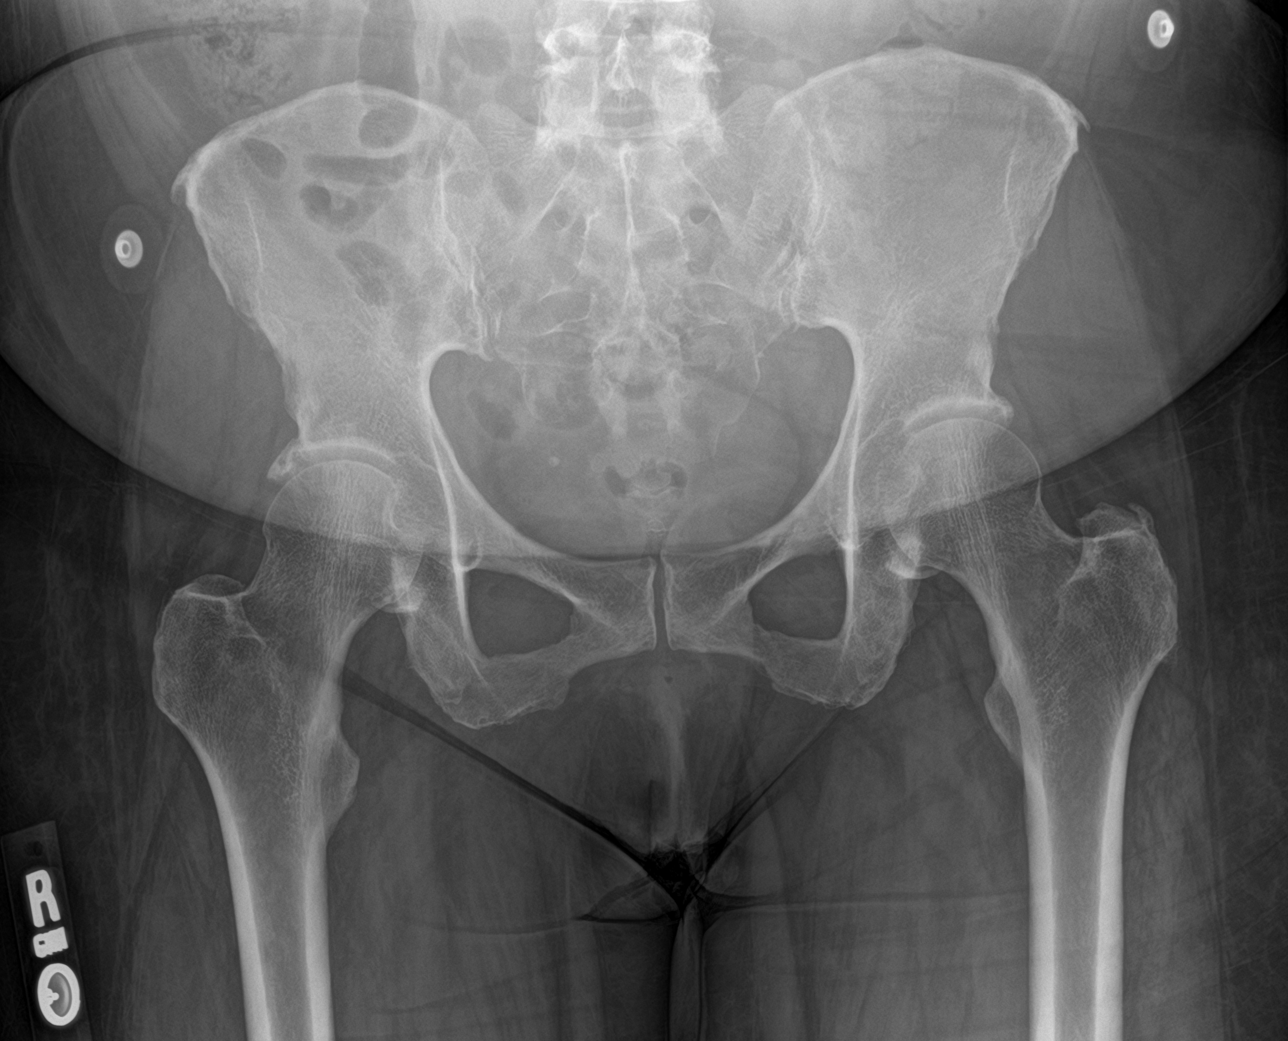

[hip ap]
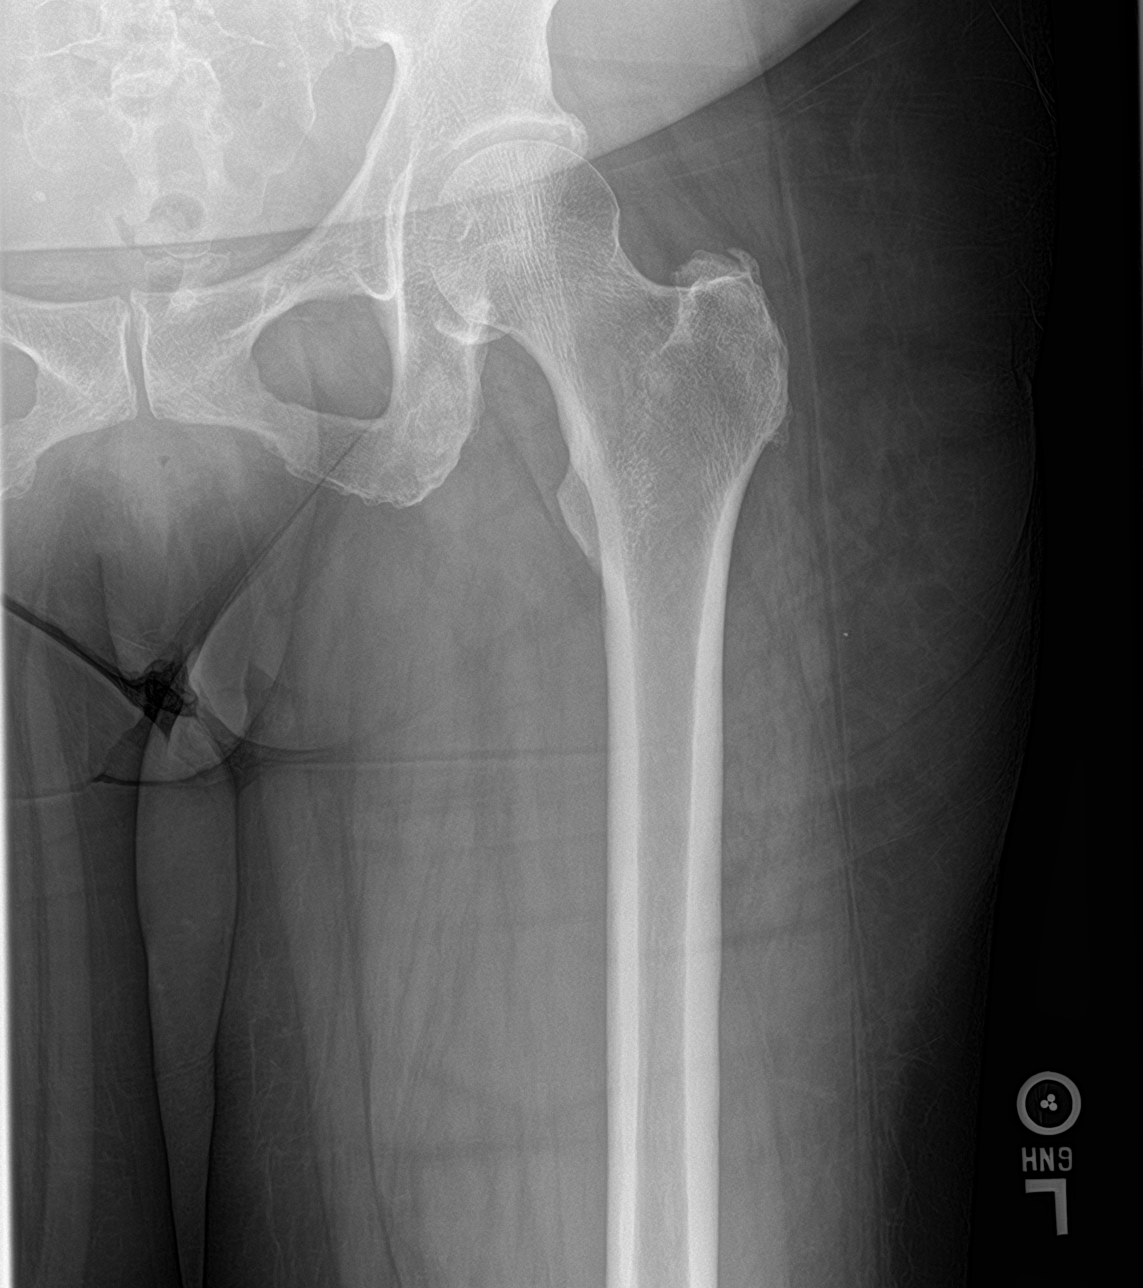

[hip lat]
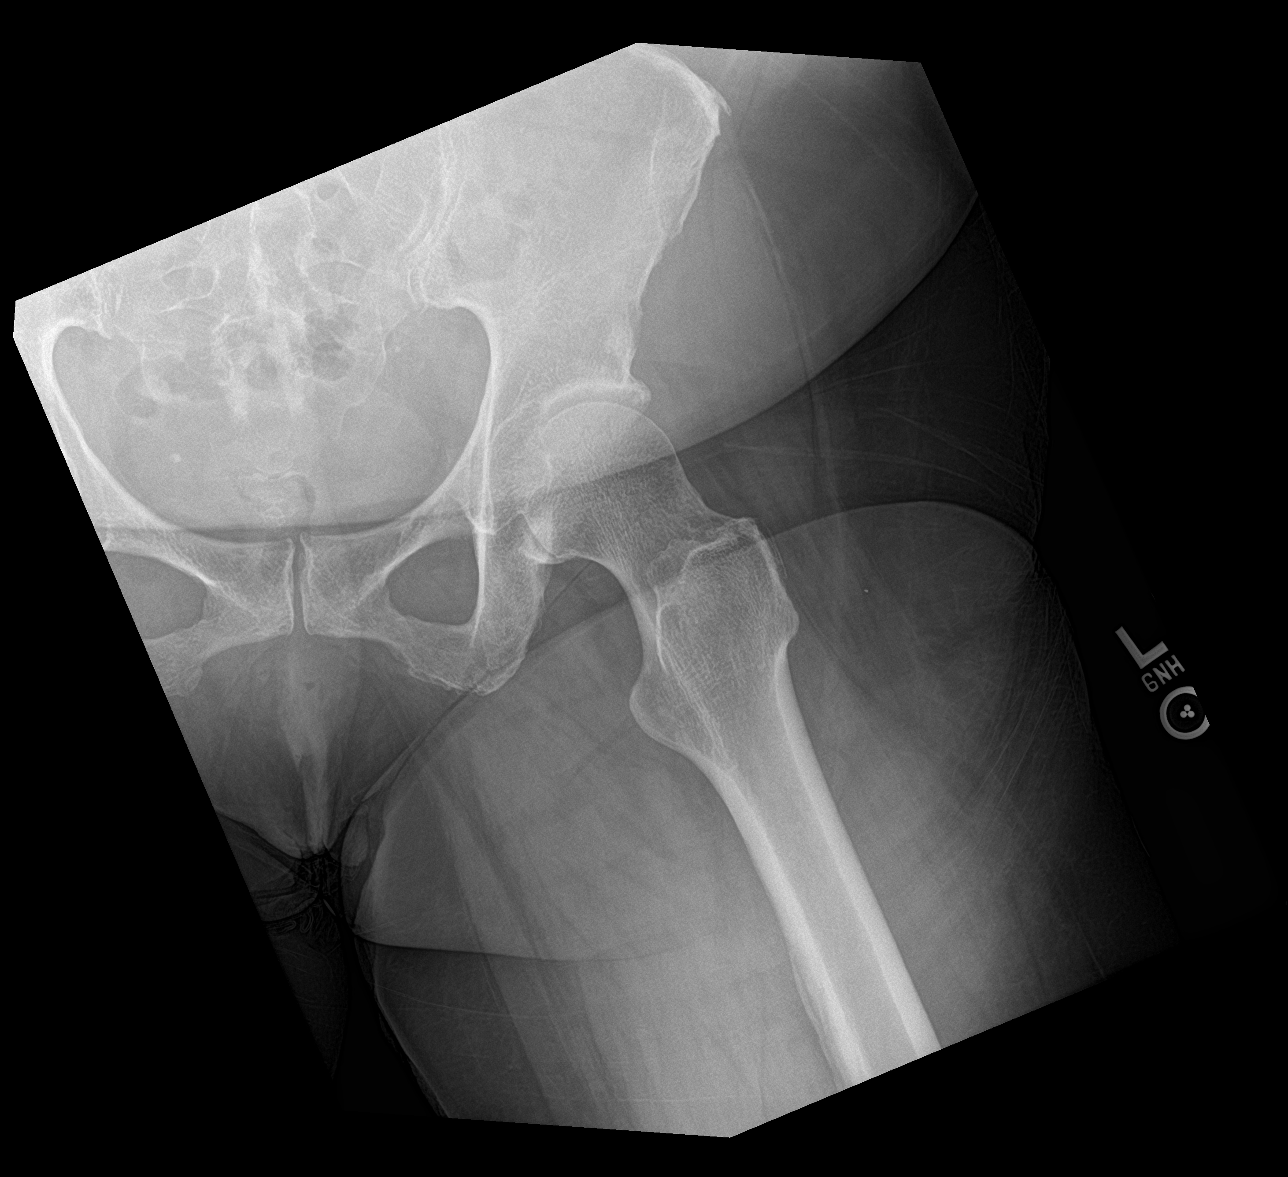

[3 of 3 positions shown; findings below may reference images not displayed]

FINDINGS: There is no evidence of hip fracture or dislocation.
Mild-to-moderate degenerative changes are seen in both hips.
IMPRESSION: No acute osseous injury.

## 2021-03-29 MED ORDER — INFLUENZA VAC SPLIT QUAD 0.5 ML IM SUSY
0.5000 mL | PREFILLED_SYRINGE | INTRAMUSCULAR | Status: AC
  Administered 2021-04-01: 0.5 mL via INTRAMUSCULAR
  Filled 2021-03-29: qty 0.5

## 2021-03-29 NOTE — Progress Notes (Signed)
PROGRESS NOTE   Carrie [name]  KGM:[id number]    DOB: 11-Dec-1960    DOA: 03/26/2021  PCP: Ann Held, DO   I have briefly reviewed patients previous medical records in [facility].  Chief Complaint  Patient presents with   Fall    Brief Narrative:  60 year old female, lives alone, medical history significant for hypertension, hyperlipidemia, depression, sustained a mechanical fall at home on 02/01/2021 after missing a step.  She had no LOC.  She did not seek immediate medical attention.  However over the next 2 weeks, she noted progressive generalized weakness and pain for which she was seen at an urgent care and prescribed muscle relaxants and NSAIDs with some relief.  However since then, she has continued to feel progressively weak, generalized pains, dizzy without vertigo, no syncope or near syncope and presented to the ED.  She has some rash over the upper chest which preceded her fall.  Initially admitted for presyncope, dehydration and generalized weakness.  Rheumatological work-up initiated.  Rheumatoid factor markedly elevated.  Initiated prednisone.   Assessment & Plan:  Principal Problem:   Pre-syncope Active Problems:   Hyperlipidemia LDL goal <100   Depression, major, single episode, moderate (HCC)   Generalized weakness   Joint pain   Non-traumatic rhabdomyolysis   Generalized pain/arthralgia and profound weakness/?  Rheumatoid arthritis: - Unclear etiology.  Although patient attributes it all to her fall, that was almost 2 months ago and she has only progressively gotten weaker.   - CK is mildly elevated.  She does have a almost healed mid upper anterior chest skin lesion which does not clearly seem like an autoimmune type but uncertain.  I do not appreciate a malar rash. - Autoimmune etiology is of concern. - Discussed with Dr. Kathlene November, rheumatology on the phone on 10/20.  As per recommendations, checked following: Ordered ESR: 26, CRP: 2.5, RA: 101.4  (markedly elevated), ANA (pending), CCP (pending). - Discussed with him again on the phone with updated results as above on 10/21.  He is concerned about rheumatoid arthritis.  Given her profound weakness and ongoing diffuse pains and finger swelling-inability to use, recommend started prednisone 20 Mg daily (10/21) x1 week then taper over the next few days to zero around 4 to 5 days before office visit with him. - Given elevated CK, hold statins for now. - Too early to tell if prednisone is making a difference. - Complains of left hip pain, will get an x-ray of the left hip and knee.  Dizziness/?  Presyncope - Unclear etiology. - Orthostatic vitals checked and negative. - TSH normal.  Telemetry shows low voltage but normal sinus rhythm without arrhythmias or pauses. - Some of this could have been due to dehydration, received IV fluids, clinically appears euvolemic. - 2D echo: LVEF 60-65%.  No regional wall motion abnormalities.  Grade 1 diastolic dysfunction.  No aortic stenosis.  Elevated CK - Mild rhabdomyolysis versus other etiologies i.e. polymyositis etc. - CK down to 484.  Hold statins. - Autoimmune work-up as above.  May need other evaluation, probably as outpatient. - Completed IV fluids.  Abnormal LFTs - Mildly elevated AST and ALT may be related to elevated CK versus other etiologies.  No GI symptoms. - Hold statins for now. - Slightly better.  Hypokalemia - Replaced.  Magnesium 2.  Profound weakness/functional quadriplegia/fall at home - CT head and C-spine negative in ED. - Awaiting PT and OT evaluation.  It is possible that she may need  SNF for short-term rehab.  Does not have 24/7 assistance at home. - PT input 10/21 appreciated.  Only ambulated 20 feet x 2.  Need to see what she does over the next day or 2.  If does not make progress, would be appropriate for short-term rehab at [facility].  Skin rash - Unclear etiology but seems to be healing. - Outpatient rheumatology  consultation versus dermatology evaluation.  Essential hypertension - Controlled on amlodipine and lisinopril.  Hyperlipidemia - Hold atorvastatin.  Depression - On bupropion and Lexapro, continue. - As per sister, patient reportedly sleeps a lot, has been drowsy which patient indicates was due to her muscle relaxants which are not continued in the hospital. - Monitor and could consider cutting back on above meds if somnolent.  Confusion/?  Cognitive impairment - Nursing reports some confusion through the night. - Patient's neck sister who is a retired PA indicates that she has been having some confusion even prior to admission. - Recommend outpatient neuropsychiatric consultation. - TSH normal.  B12 levels high.  RPR nonreactive.  Body mass index is 30.88 kg/m.    DVT prophylaxis: enoxaparin (LOVENOX) injection 40 mg Start: 03/26/21 2230     Code Status: Full Code Family Communication: Discussed in detail with patient's next sister who is a retired PA at bedside on 10/21, updated care and answered all questions. Disposition:  Status is: Inpatient        Consultants:   None  Procedures:   None  Antimicrobials:    Anti-infectives (From admission, onward)    None         Subjective:  Reports some left hip/knee pain.  No other complaints reported.  Objective:   Vitals:   03/29/21 0419 03/29/21 0420 03/29/21 0939 03/29/21 1341  BP: 127/88  121/80 115/83  Pulse: (!) 106   (!) 106  Resp: 20   19  Temp: (!) 97.5 F (36.4 C)   97.8 F (36.6 C)  TempSrc: Oral   Oral  SpO2: 97%   96%  Weight:  81.6 kg    Height:        General exam: [place] female, moderately built and overweight sitting up in bed eating breakfast.  Looks much improved compared to the last few days.  Alert.  Appears to be in good spirits. Respiratory system: Clear to auscultation.  No increased work of breathing. Cardiovascular system: S1 and S2 heard, RRR.  No JVD, pedal edema or  murmurs.  Telemetry personally reviewed: Sinus rhythm-mild sinus tachycardia in the 100s.  Discontinue telemetry. Gastrointestinal system: Abdomen is nondistended, soft and nontender. No organomegaly or masses felt. Normal bowel sounds heard. Central nervous system: Alert and oriented. No focal neurological deficits. Extremities: Proximally/bilateral shoulders and hips, grade 4+ by 5 power.  Distally normal power.  Bilateral fingers with some diffuse/boggy swelling without tenderness or increased warmth or redness.  Left hand worse than the right.  Left knee without acute findings. Skin: No rashes, lesions or ulcers Psychiatry: Judgement and insight appear normal. Mood & affect flat.     Data Reviewed:   I have personally reviewed following labs and imaging studies   CBC: Recent Labs  Lab 03/26/21 1712  WBC 6.4  NEUTROABS 5.7  HGB 14.5  HCT 42.6  MCV 91.6  PLT 579    Basic Metabolic Panel: Recent Labs  Lab 03/26/21 1712 03/27/21 0520 03/27/21 1627 03/28/21 0454  NA 137 134*  --  136  K 3.9 3.4*  --  4.4  CL  111 111  --  112*  CO2 18* 19*  --  18*  GLUCOSE 99 111*  --  104*  BUN 20 14  --  9  CREATININE 1.03* 0.76  --  0.80  CALCIUM 9.3 8.2*  --  8.5*  MG  --   --  2.0  --     Liver Function Tests: Recent Labs  Lab 03/26/21 1712 03/27/21 0520 03/28/21 0454  AST 161* 118* 107*  ALT 114* 87* 77*  ALKPHOS 80 64 63  BILITOT 1.2 0.9 0.8  PROT 7.6 6.2* 6.3*  ALBUMIN 3.3* 2.6* 2.6*    CBG: Recent Labs  Lab 03/27/21 0458 03/28/21 0505 03/29/21 0418  GLUCAP 114* 91 103*    Microbiology Studies:   Recent Results (from the past 240 hour(s))  Resp Panel by RT-PCR (Flu A&B, Covid) Nasopharyngeal Swab     Status: None   Collection Time: 03/26/21 10:14 PM   Specimen: Nasopharyngeal Swab; Nasopharyngeal(NP) swabs in vial transport medium  Result Value Ref Range Status   SARS Coronavirus 2 by RT PCR NEGATIVE NEGATIVE Final    Comment: (NOTE) SARS-CoV-2 target  nucleic acids are NOT DETECTED.  The SARS-CoV-2 RNA is generally detectable in upper respiratory specimens during the acute phase of infection. The lowest concentration of SARS-CoV-2 viral copies this assay can detect is 138 copies/mL. A negative result does not preclude SARS-Cov-2 infection and should not be used as the sole basis for treatment or other patient management decisions. A negative result may occur with  improper specimen collection/handling, submission of specimen other than nasopharyngeal swab, presence of viral mutation(s) within the areas targeted by this assay, and inadequate number of viral copies(<138 copies/mL). A negative result must be combined with clinical observations, patient history, and epidemiological information. The expected result is Negative.  Fact Sheet for Patients:  EntrepreneurPulse.com.au  Fact Sheet for Healthcare Providers:  IncredibleEmployment.be  This test is no t yet approved or cleared by the [place] FDA and  has been authorized for detection and/or diagnosis of SARS-CoV-2 by FDA under an Emergency Use Authorization (EUA). This EUA will remain  in effect (meaning this test can be used) for the duration of the COVID-19 declaration under Section 564(b)(1) of the Act, 21 U.S.C.section 360bbb-3(b)(1), unless the authorization is terminated  or revoked sooner.       Influenza A by PCR NEGATIVE NEGATIVE Final   Influenza B by PCR NEGATIVE NEGATIVE Final    Comment: (NOTE) The Xpert Xpress SARS-CoV-2/FLU/RSV plus assay is intended as an aid in the diagnosis of influenza from Nasopharyngeal swab specimens and should not be used as a sole basis for treatment. Nasal washings and aspirates are unacceptable for Xpert Xpress SARS-CoV-2/FLU/RSV testing.  Fact Sheet for Patients: EntrepreneurPulse.com.au  Fact Sheet for Healthcare  Providers: IncredibleEmployment.be  This test is not yet approved or cleared by the [place] FDA and has been authorized for detection and/or diagnosis of SARS-CoV-2 by FDA under an Emergency Use Authorization (EUA). This EUA will remain in effect (meaning this test can be used) for the duration of the COVID-19 declaration under Section 564(b)(1) of the Act, 21 U.S.C. section 360bbb-3(b)(1), unless the authorization is terminated or revoked.  Performed at [facility], [place] [address], [place], [place] [address]      Radiology Studies:  No results found.   Scheduled Meds:    amLODipine  5 mg Oral Daily   buPROPion  300 mg Oral Daily   enoxaparin (LOVENOX) injection  40 mg Subcutaneous Q24H   escitalopram  20 mg Oral Daily   [START ON 03/30/2021] influenza vac split quadrivalent PF  0.5 mL Intramuscular Tomorrow-1000   lisinopril  30 mg Oral Daily   pantoprazole  40 mg Oral Daily   predniSONE  20 mg Oral Q breakfast   sodium chloride flush  3 mL Intravenous Q12H    Continuous Infusions:     LOS: 2 days     Vernell Leep, MD, FACP, [facility]. [facility]    To contact the attending provider between 7A-7P or the covering provider during after hours 7P-7A, please log into the web site [email or web address] and access using universal [place] password for that web site. If you do not have the password, please call the hospital operator.  03/29/2021, 3:37 PM

## 2021-03-30 NOTE — Evaluation (Signed)
Occupational Therapy Evaluation Patient Details Name: Carrie [name] MRN: [id number] DOB: [birth year] Today's Date: 03/30/2021   History of Present Illness 60 year old female, lives alone, medical history significant for hypertension, hyperlipidemia, depression, sustained a mechanical fall at home on 02/01/2021 after missing a step.  Patient  has continued to feel progressively weak, generalized pains, dizzy without vertigo, no syncope or near syncope and presented to the ED 03/26/21.Admitted for presyncope, dehydration       and generalized weakness.  Rheumatological work-up initiated.  Rheumatoid factor markedly elevated.  Initiated prednisone   Clinical Impression   Pt is typically independent in ADL and mobility in multi-story home. But recently has been really tired all the time and struggling to perform ADL and IADL. Today she was overall min guard to min A for toilet transfer with in room ambulation with RW, able to perform standing grooming. Required seated rest break and then engaged in short hallwy ambulation with RW. Pt can reach down to her feet for LB dressing "but it makes me so tired" at this time recommending short term SNF placement to maximize safety and independence in ADL and functional transfer with hopes for improvement and switch to [facility], but she will need to be able to go up a flight of stairs. Also recommending at 3 in 1 at this time to be used as shower chair. Next session plan on bringing [facility] handout and focusing on education in addition to activity tolerance for ADL.      Recommendations for follow up therapy are one component of a multi-disciplinary discharge planning process, led by the attending physician.  Recommendations may be updated based on patient status, additional functional criteria and insurance authorization.   Follow Up Recommendations  SNF;Supervision/Assistance - 24 hour;Home health OT    Equipment Recommendations  3 in 1 bedside commode;Other (comment)  (RW)    Recommendations for Other Services PT consult     Precautions / Restrictions Precautions Precautions: Fall Precaution Comments: fatigues quickly Restrictions Weight Bearing Restrictions: No      Mobility Bed Mobility Overal bed mobility: Needs Assistance Bed Mobility: Supine to Sit     Supine to sit: Mod assist     General bed mobility comments: increased time and mod A for trunk elevation. Pt able to walk legs over to the side of bed    Transfers Overall transfer level: Needs assistance   Transfers: Sit to/from Stand Sit to Stand: Min assist         General transfer comment: steady assist to rise vc for safe hand placement    Balance Overall balance assessment: History of Falls;Needs assistance Sitting-balance support: No upper extremity supported;Feet supported Sitting balance-Leahy Scale: Good     Standing balance support: During functional activity;Bilateral upper extremity supported;No upper extremity supported Standing balance-Leahy Scale: Poor Standing balance comment: able to static stand for ADL without UE support, dependent for dynamic movement                           ADL either performed or assessed with clinical judgement   ADL Overall ADL's : Needs assistance/impaired Eating/Feeding: Modified independent;Sitting   Grooming: Min guard;Wash/dry hands;Standing Grooming Details (indicate cue type and reason): sink level, decreased activity tolerance - reports sitting frequently Upper Body Bathing: Set up;Sitting   Lower Body Bathing: Set up;Sitting/lateral leans Lower Body Bathing Details (indicate cue type and reason): able to reach down to toes without problem Upper Body Dressing : Set up;Sitting  Upper Body Dressing Details (indicate cue type and reason): to don gown backwards like cape Lower Body Dressing: Minimal assistance;Sit to/from stand   Toilet Transfer: Minimal assistance;Ambulation;RW Toilet Transfer Details  (indicate cue type and reason): steadying assist Toileting- Clothing Manipulation and Hygiene: Min guard;Sit to/from stand   Tub/ Shower Transfer: Walk-in shower;Min guard;Ambulation;Rolling walker   Functional mobility during ADLs: Minimal assistance;Min guard;Rolling walker General ADL Comments: decreased activity tolerance and in need of enery conservation     Vision Baseline Vision/History: 1 Wears glasses Ability to See in Adequate Light: 0 Adequate Patient Visual Report: No change from baseline       Perception     Praxis      Pertinent Vitals/Pain Pain Assessment: No/denies pain     Hand Dominance Right   Extremity/Trunk Assessment Upper Extremity Assessment Upper Extremity Assessment: Overall WFL for tasks assessed   Lower Extremity Assessment Lower Extremity Assessment: Defer to PT evaluation   Cervical / Trunk Assessment Cervical / Trunk Assessment: Normal   Communication Communication Communication: No difficulties   Cognition Arousal/Alertness: Awake/alert Behavior During Therapy: WFL for tasks assessed/performed Overall Cognitive Status: No family/caregiver present to determine baseline cognitive functioning Area of Impairment: Problem solving                             Problem Solving: Decreased initiation;Requires verbal cues General Comments: Pt admits that she struggles to perform ADL/IADL and has no energy but really struggles with problem solving and coming up with her own compensatory strategies   General Comments       Exercises     Shoulder Instructions      Home Living Family/patient expects to be discharged to:: Private residence Living Arrangements: Alone Available Help at Discharge: Family;Available PRN/intermittently Type of Home: House Home Access: Stairs to enter Entrance Stairs-Number of Steps: 2   Home Layout: Two level Alternate Level Stairs-Number of Steps: 10   Bathroom Shower/Tub: Company secretary: Standard Bathroom Accessibility: Yes How Accessible: Accessible via walker Home Equipment: None   Additional Comments: bed and bath are both on 2nd floor. 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        OT Problem List: Decreased strength;Decreased activity tolerance;Impaired balance (sitting and/or standing);Decreased safety awareness;Decreased knowledge of precautions;Obesity      OT Treatment/Interventions: Therapeutic exercise;Self-care/ADL training;Energy conservation;Therapeutic activities;Patient/family education;Balance training    OT Goals(Current goals can be found in the care plan section) Acute Rehab OT Goals Patient Stated Goal: to not be so tired all the time OT Goal Formulation: With patient Time For Goal Achievement: 04/14/21 Potential to Achieve Goals: Good ADL Goals Pt Will Perform Grooming: with modified independence;standing Pt Will Perform Upper Body Dressing: with modified independence;sitting Pt Will Perform Lower Body Dressing: with modified independence;sit to/from stand Pt Will Transfer to Toilet: with modified independence;ambulating Pt Will Perform Toileting - Clothing Manipulation and hygiene: with modified independence;sit to/from stand Additional ADL Goal #1: Pt will verbalize 3 ways of conserving energy during ADL at independent level Additional ADL Goal #2: Pt will perform bed mobility at mod I level prior to engaging in ADL  OT Frequency: Min 2X/week   Barriers to D/C: Decreased caregiver  support;Inaccessible home environment  Pt needs to be able to go up a flight of stairs to access bedroom and bathroom       Co-evaluation              AM-PAC OT "6 Clicks" Daily Activity     Outcome Measure  Help from another person eating meals?: None Help from another person taking care of personal grooming?: A Little Help from another person toileting, which includes using toliet, bedpan, or urinal?: A Little Help from another person bathing (including washing, rinsing, drying)?: A Little Help from another person to put on and taking off regular upper body clothing?: A Little Help from another person to put on and taking off regular lower body clothing?: A Lot 6 Click Score: 18   End of Session Equipment Utilized During Treatment: Gait belt;Rolling walker Nurse Communication: Mobility status  Activity Tolerance: Patient tolerated treatment well;Patient limited by fatigue Patient left: in chair;with call bell/phone within reach;with chair alarm set  OT Visit Diagnosis: Unsteadiness on feet (R26.81);Other abnormalities of gait and mobility (R26.89);History of falling (Z91.81);Muscle weakness (generalized) (M62.81)                Time: 6761-9509 OT Time Calculation (min): 23 min Charges:  OT General Charges $OT Visit: 1 Visit OT Evaluation $OT Eval Moderate Complexity: 1 Mod OT Treatments $Self Care/Home Management : 8-22 mins  Nyoka Cowden OTR/L Acute Rehabilitation Services Pager: [phone] Office: [phone]   Evern Bio Aliah Eriksson 03/30/2021, 4:59 PM

## 2021-03-30 NOTE — Progress Notes (Signed)
PROGRESS NOTE   Carrie [name]  UGQ:[id number]    DOB: 04-28-1961    DOA: 03/26/2021  PCP: Ann Held, DO   I have briefly reviewed patients previous medical records in [facility].  Chief Complaint  Patient presents with   Fall    Brief Narrative:  60 year old female, lives alone, medical history significant for hypertension, hyperlipidemia, depression, sustained a mechanical fall at home on 02/01/2021 after missing a step.  She had no LOC.  She did not seek immediate medical attention.  However over the next 2 weeks, she noted progressive generalized weakness and pain for which she was seen at an urgent care and prescribed muscle relaxants and NSAIDs with some relief.  However since then, she has continued to feel progressively weak, generalized pains, dizzy without vertigo, no syncope or near syncope and presented to the ED.  She has some rash over the upper chest which preceded her fall.  Initially admitted for presyncope, dehydration and generalized weakness.  Rheumatological work-up initiated.  Rheumatoid factor markedly elevated.  Initiated prednisone.   Assessment & Plan:  Principal Problem:   Pre-syncope Active Problems:   Hyperlipidemia LDL goal <100   Depression, major, single episode, moderate (HCC)   Generalized weakness   Joint pain   Non-traumatic rhabdomyolysis   Generalized pain/arthralgia and profound weakness/?  Rheumatoid arthritis: - Unclear etiology.  Although patient attributes it all to her fall, that was almost 2 months ago and she has only progressively gotten weaker.   - CK is mildly elevated.  She does have a almost healed mid upper anterior chest skin lesion which does not clearly seem like an autoimmune type but uncertain.  I do not appreciate a malar rash. - Autoimmune etiology is of concern. - Discussed with Dr. Kathlene November, rheumatology on the phone on 10/20.  As per recommendations, checked following: Ordered ESR: 26, CRP: 2.5, RA: 101.4  (markedly elevated), ANA (pending), CCP (pending). - Discussed with him again on the phone with updated results as above on 10/21.  He is concerned about rheumatoid arthritis.  Given her profound weakness and ongoing diffuse pains and finger swelling-inability to use, recommend started prednisone 20 Mg daily (10/21) x1 week then taper over the next few days to zero around 4 to 5 days before office visit with him. - Given elevated CK, hold statins for now. - X-rays of left hip and knee negative. - Appears to be getting stronger, pain better, weight PT to reassess.  At this time medically stable for DC pending decision regarding discharge location.  If continues to do poorly with PT, then will need SNF for short-term rehab.  Awaiting PT to reevaluate.  Dizziness/?  Presyncope - Unclear etiology. - Orthostatic vitals checked and negative. - TSH normal.  Telemetry shows low voltage but normal sinus rhythm without arrhythmias or pauses. - Some of this could have been due to dehydration, received IV fluids, clinically appears euvolemic. - 2D echo: LVEF 60-65%.  No regional wall motion abnormalities.  Grade 1 diastolic dysfunction.  No aortic stenosis.  Elevated CK - Mild rhabdomyolysis versus other etiologies i.e. polymyositis etc. - CK down to 484.  Hold statins. - Autoimmune work-up as above.  May need other evaluation, probably as outpatient. - Completed IV fluids.  Abnormal LFTs - Mildly elevated AST and ALT may be related to elevated CK versus other etiologies.  No GI symptoms. - Hold statins for now. - Slightly better.  Hypokalemia - Replaced.  Magnesium 2.  Profound  weakness/functional quadriplegia/fall at home - CT head and C-spine negative in ED. - Awaiting PT and OT evaluation.  It is possible that she may need SNF for short-term rehab.  Does not have 24/7 assistance at home. - PT input 10/21 appreciated.  Only ambulated 20 feet x 2.  Need to see what she does over the next day or  2.  If does not make progress, would be appropriate for short-term rehab at [facility].  Skin rash - Unclear etiology but seems to be healing. - Outpatient rheumatology consultation versus dermatology evaluation.  Essential hypertension - Controlled on amlodipine and lisinopril.  Hyperlipidemia - Hold atorvastatin.  Depression - On bupropion and Lexapro, continue. - As per sister, patient reportedly sleeps a lot, has been drowsy which patient indicates was due to her muscle relaxants which are not continued in the hospital. - Monitor and could consider cutting back on above meds if somnolent.  Confusion/?  Cognitive impairment - Nursing reports some confusion through the night. - Patient's neck sister who is a retired PA indicates that she has been having some confusion even prior to admission. - Recommend outpatient neuropsychiatric consultation. - TSH normal.  B12 levels high.  RPR nonreactive.  Body mass index is 29.97 kg/m.    DVT prophylaxis: enoxaparin (LOVENOX) injection 40 mg Start: 03/26/21 2230     Code Status: Full Code Family Communication: Discussed in detail with patient's next sister who is a retired PA at bedside on 10/21, updated care and answered all questions. Disposition:  Status is: Inpatient        Consultants:   None  Procedures:   None  Antimicrobials:    Anti-infectives (From admission, onward)    None         Subjective:  Reports that she feels stronger.  Able to ambulate with the help of a walker by herself and nurses are only supervising without assisting.  Able to walk to the bathroom and in her room.  Not as much pain as on admission.  Objective:   Vitals:   03/29/21 2012 03/30/21 0437 03/30/21 0439 03/30/21 1415  BP: 105/79 118/81  118/78  Pulse: (!) 109 (!) 101  (!) 109  Resp: _0 Temp: 98.2 F (36.8 C) (!) 97.4 F (36.3 C)  98.4 F (36.9 C)  TempSrc: Oral Oral    SpO2: 93% 96%  96%  Weight:   79.2 kg   Height:         General exam: Middle-age female, moderately built and overweight sitting up in bed eating breakfast.  Looks much improved compared to the last few days.  Alert.  Appears to be in good spirits. Respiratory system: Clear to auscultation.  No increased work of breathing. Cardiovascular system: S1 and S2 heard, RRR.  No JVD, murmurs or pedal edema.  Off telemetry now. Gastrointestinal system: Abdomen is nondistended, soft and nontender. No organomegaly or masses felt. Normal bowel sounds heard. Central nervous system: Alert and oriented. No focal neurological deficits. Extremities: Proximally/bilateral shoulders and hips, grade 4+ by 5 power.  Distally normal power.  Bilateral fingers with some diffuse/boggy swelling without tenderness or increased warmth or redness.  Left hand worse than the right.  Left knee without acute findings. Skin: No rashes, lesions or ulcers Psychiatry: Judgement and insight appear normal. Mood & affect flat.     Data Reviewed:   I have personally reviewed following labs and imaging studies   CBC: Recent Labs  Lab 03/26/21 1712  WBC 6.4  NEUTROABS 5.7  HGB 14.5  HCT 42.6  MCV 91.6  PLT 829    Basic Metabolic Panel: Recent Labs  Lab 03/26/21 1712 03/27/21 0520 03/27/21 1627 03/28/21 0454  NA 137 134*  --  136  K 3.9 3.4*  --  4.4  CL 111 111  --  112*  CO2 18* 19*  --  18*  GLUCOSE 99 111*  --  104*  BUN 20 14  --  9  CREATININE 1.03* 0.76  --  0.80  CALCIUM 9.3 8.2*  --  8.5*  MG  --   --  2.0  --     Liver Function Tests: Recent Labs  Lab 03/26/21 1712 03/27/21 0520 03/28/21 0454  AST 161* 118* 107*  ALT 114* 87* 77*  ALKPHOS 80 64 63  BILITOT 1.2 0.9 0.8  PROT 7.6 6.2* 6.3*  ALBUMIN 3.3* 2.6* 2.6*    CBG: Recent Labs  Lab 03/27/21 0458 03/28/21 0505 03/29/21 0418  GLUCAP 114* 91 103*    Microbiology Studies:   Recent Results (from the past 240 hour(s))  Resp Panel by RT-PCR (Flu A&B, Covid) Nasopharyngeal Swab      Status: None   Collection Time: 03/26/21 10:14 PM   Specimen: Nasopharyngeal Swab; Nasopharyngeal(NP) swabs in vial transport medium  Result Value Ref Range Status   SARS Coronavirus 2 by RT PCR NEGATIVE NEGATIVE Final    Comment: (NOTE) SARS-CoV-2 target nucleic acids are NOT DETECTED.  The SARS-CoV-2 RNA is generally detectable in upper respiratory specimens during the acute phase of infection. The lowest concentration of SARS-CoV-2 viral copies this assay can detect is 138 copies/mL. A negative result does not preclude SARS-Cov-2 infection and should not be used as the sole basis for treatment or other patient management decisions. A negative result may occur with  improper specimen collection/handling, submission of specimen other than nasopharyngeal swab, presence of viral mutation(s) within the areas targeted by this assay, and inadequate number of viral copies(<138 copies/mL). A negative result must be combined with clinical observations, patient history, and epidemiological information. The expected result is Negative.  Fact Sheet for Patients:  EntrepreneurPulse.com.au  Fact Sheet for Healthcare Providers:  IncredibleEmployment.be  This test is no t yet approved or cleared by the [place] FDA and  has been authorized for detection and/or diagnosis of SARS-CoV-2 by FDA under an Emergency Use Authorization (EUA). This EUA will remain  in effect (meaning this test can be used) for the duration of the COVID-19 declaration under Section 564(b)(1) of the Act, 21 U.S.C.section 360bbb-3(b)(1), unless the authorization is terminated  or revoked sooner.       Influenza A by PCR NEGATIVE NEGATIVE Final   Influenza B by PCR NEGATIVE NEGATIVE Final    Comment: (NOTE) The Xpert Xpress SARS-CoV-2/FLU/RSV plus assay is intended as an aid in the diagnosis of influenza from Nasopharyngeal swab specimens and should not be used as a sole basis  for treatment. Nasal washings and aspirates are unacceptable for Xpert Xpress SARS-CoV-2/FLU/RSV testing.  Fact Sheet for Patients: EntrepreneurPulse.com.au  Fact Sheet for Healthcare Providers: IncredibleEmployment.be  This test is not yet approved or cleared by the [place] FDA and has been authorized for detection and/or diagnosis of SARS-CoV-2 by FDA under an Emergency Use Authorization (EUA). This EUA will remain in effect (meaning this test can be used) for the duration of the COVID-19 declaration under Section 564(b)(1) of the Act, 21 U.S.C. section 360bbb-3(b)(1), unless the authorization is terminated or  revoked.  Performed at [facility], [place] [address] [place], [place] [address]      Radiology Studies:  DG Knee Complete 4 Views Left  Result Date: 03/29/2021 CLINICAL DATA:  Fall 3 weeks ago with ongoing pain in the left hip and knee. EXAM: LEFT KNEE - COMPLETE 4+ VIEW COMPARISON:  None. FINDINGS: No evidence of fracture, dislocation, or joint effusion. 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 The soft tissues appear normal. IMPRESSION: No acute osseous injury. Electronically Signed   By: Zerita Boers M.D.   On: 03/29/2021 17:52   DG HIP UNILAT WITH PELVIS 2-3 VIEWS LEFT  Result Date: 03/29/2021 CLINICAL DATA:  Fall 3 weeks ago with ongoing pain in the left hip and knee. EXAM: DG HIP (WITH OR WITHOUT PELVIS) 2-3V LEFT COMPARISON:  None. FINDINGS: There is no evidence of hip fracture or dislocation. Mild-to-moderate degenerative changes are seen in both hips. IMPRESSION: No acute osseous injury. Electronically Signed   By: Zerita Boers M.D.   On: 03/29/2021 17:51     Scheduled Meds:    amLODipine  5 mg Oral Daily   buPROPion  300 mg Oral Daily   enoxaparin (LOVENOX) injection  40 mg Subcutaneous Q24H   escitalopram  20 mg Oral Daily   influenza vac split quadrivalent PF   0.5 mL Intramuscular Tomorrow-1000   lisinopril  30 mg Oral Daily   pantoprazole  40 mg Oral Daily   predniSONE  20 mg Oral Q breakfast   sodium chloride flush  3 mL Intravenous Q12H    Continuous Infusions:     LOS: 3 days     Vernell Leep, MD, FACP, [facility]. [facility]    To contact the attending provider between 7A-7P or the covering provider during after hours 7P-7A, please log into the web site [email or web address] and access using universal [place] password for that web site. If you do not have the password, please call the hospital operator.  03/30/2021, 3:17 PM

## 2021-03-31 ENCOUNTER — Inpatient Hospital Stay (HOSPITAL_COMMUNITY): Payer: Federal, State, Local not specified - PPO

## 2021-03-31 DIAGNOSIS — R531 Weakness: Secondary | ICD-10-CM | POA: Diagnosis not present

## 2021-03-31 DIAGNOSIS — R55 Syncope and collapse: Secondary | ICD-10-CM | POA: Diagnosis not present

## 2021-03-31 LAB — COMPREHENSIVE METABOLIC PANEL
ALT: 135 U/L — ABNORMAL HIGH (ref 0–44)
AST: 150 U/L — ABNORMAL HIGH (ref 15–41)
Albumin: 2.9 g/dL — ABNORMAL LOW (ref 3.5–5.0)
Alkaline Phosphatase: 71 U/L (ref 38–126)
Anion gap: 6 (ref 5–15)
BUN: 13 mg/dL (ref 8–23)
CO2: 22 mmol/L (ref 22–32)
Calcium: 8.8 mg/dL — ABNORMAL LOW (ref 8.9–10.3)
Chloride: 106 mmol/L (ref 98–111)
Creatinine, Ser: 0.87 mg/dL (ref 0.44–1.00)
GFR, Estimated: >60 mL/min (ref >60)
Glucose, Bld: 96 mg/dL (ref 70–99)
Potassium: 3.8 mmol/L (ref 3.5–5.1)
Sodium: 134 mmol/L — ABNORMAL LOW (ref 135–145)
Total Bilirubin: 0.8 mg/dL (ref 0.3–1.2)
Total Protein: 6.6 g/dL (ref 6.5–8.1)

## 2021-03-31 LAB — HEPATITIS PANEL, ACUTE
HCV Ab: NONREACTIVE
Hep A IgM: NONREACTIVE
Hep B C IgM: NONREACTIVE
Hepatitis B Surface Ag: NONREACTIVE

## 2021-03-31 LAB — CK: Total CK: 226 U/L (ref 38–234)

## 2021-03-31 IMAGING — US US ABDOMEN LIMITED
1 series · 15 of 25 positions shown · non-contrast
Comparison: None

CLINICAL DATA: Abnormal LFTs

EXAM:
ULTRASOUND ABDOMEN LIMITED RIGHT UPPER QUADRANT

[Series 1: us abdomen limited ruq mc & wl · 15 of 69 slices shown]
[im 1/69]
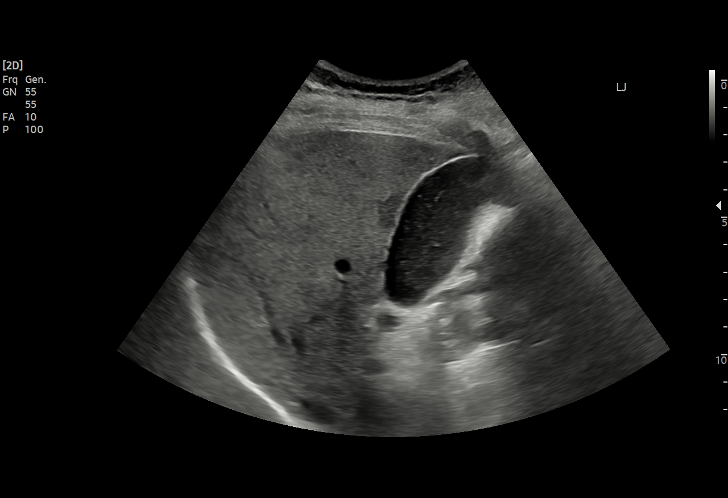
[im 6/69]
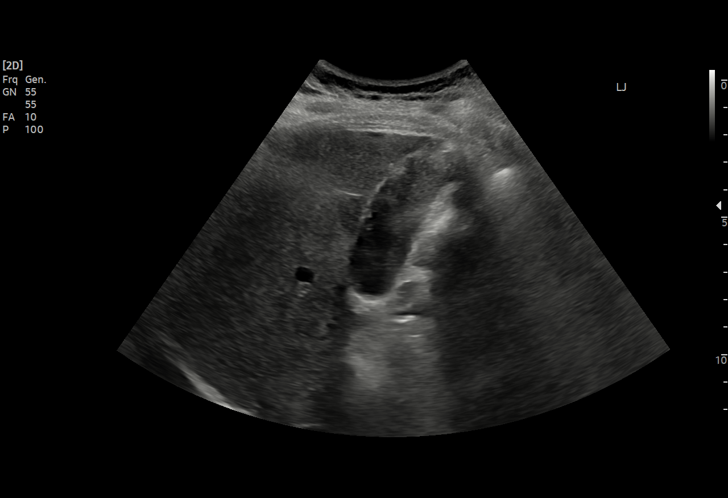
[im 12/69]
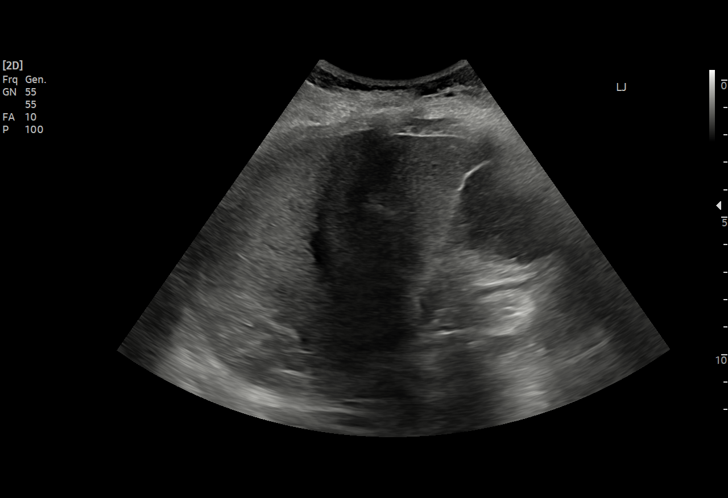
[im 15/69]
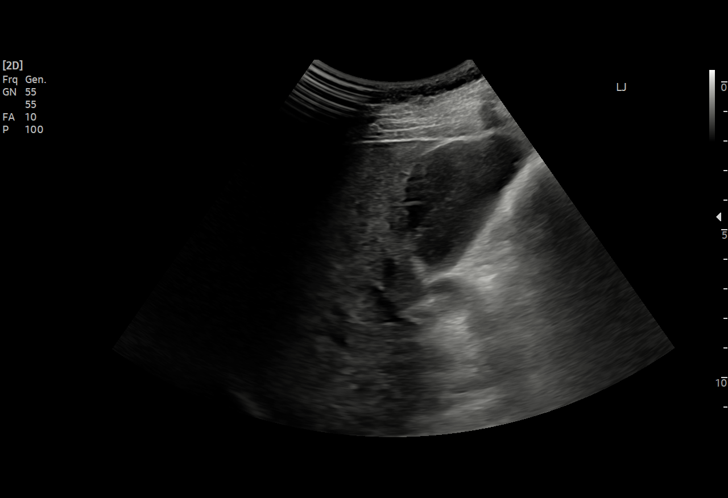
[im 20/69]
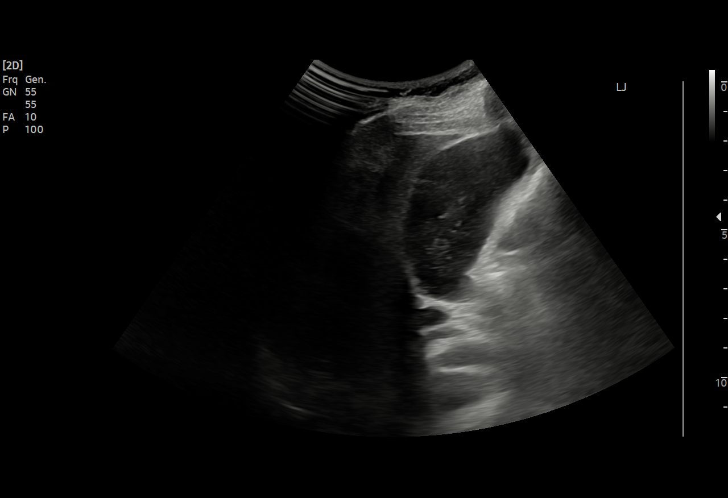
[im 26/69]
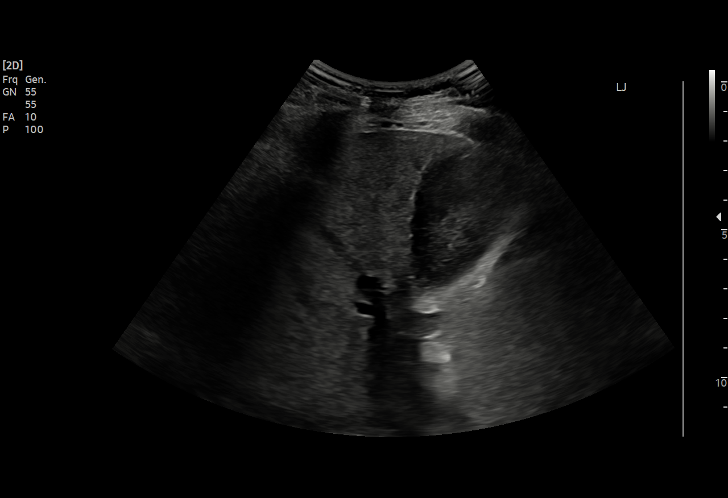
[im 29/69]
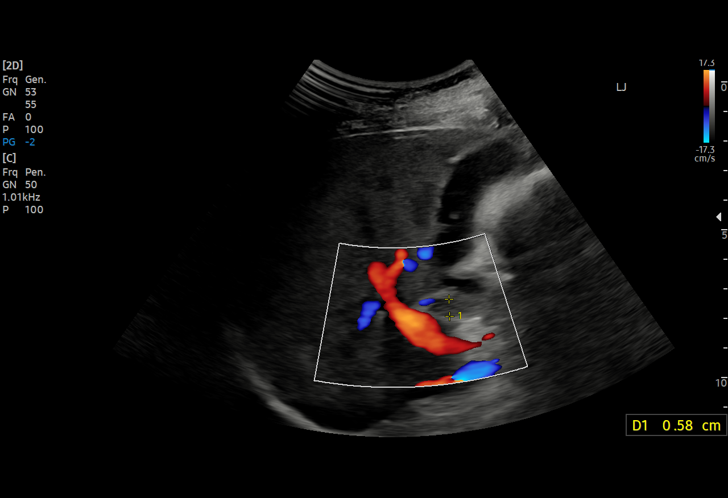
[im 35/69]
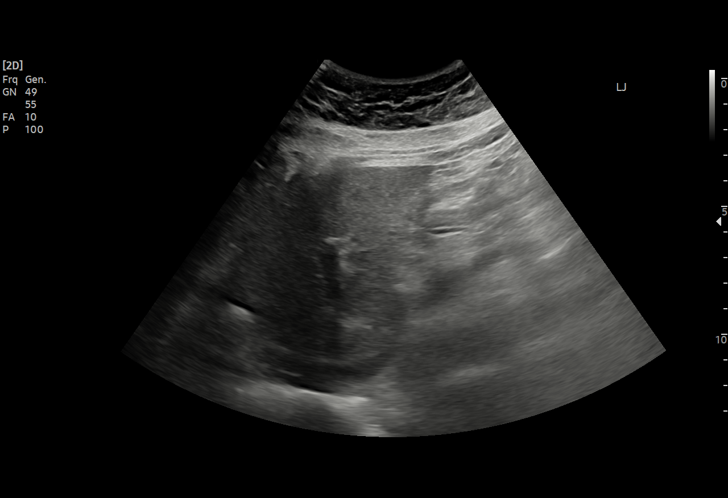
[im 40/69]
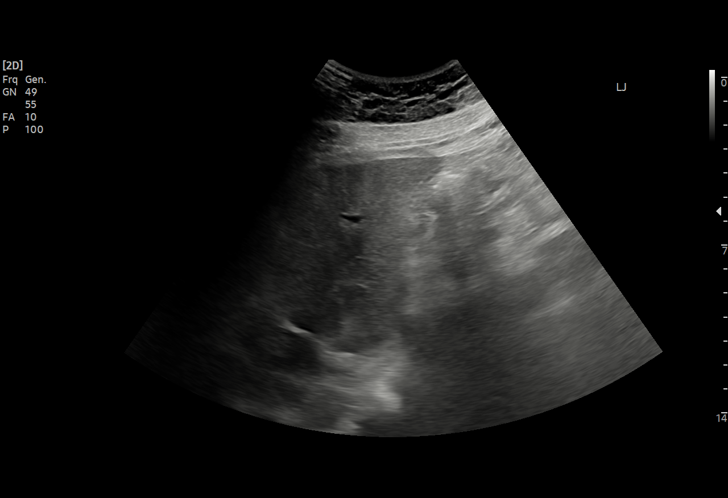
[im 43/69]
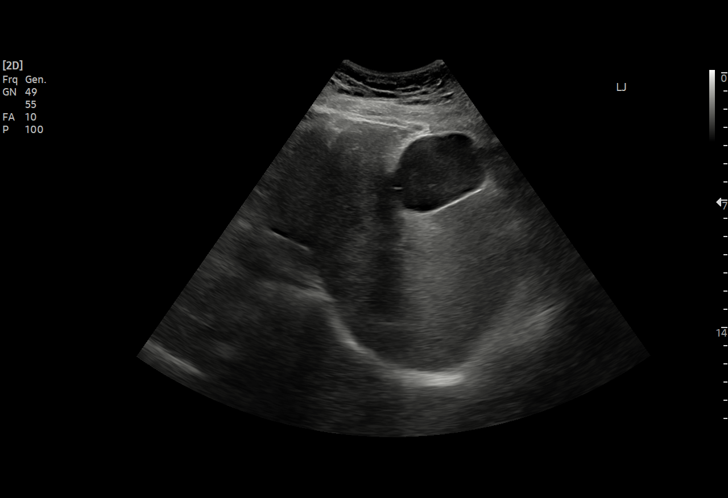
[im 49/69]
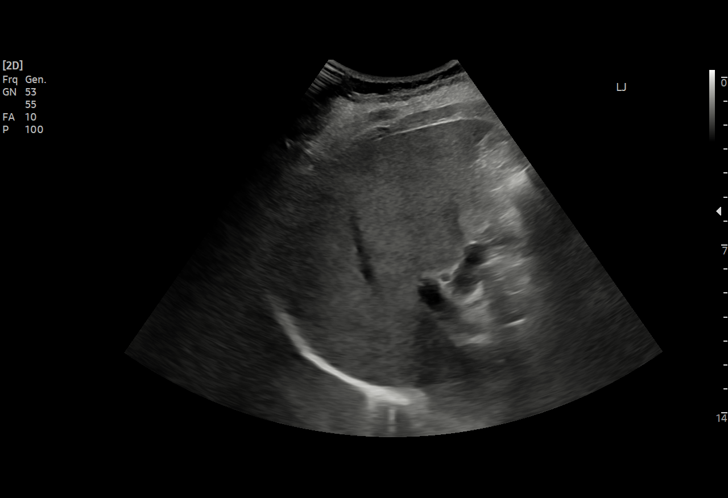
[im 54/69]
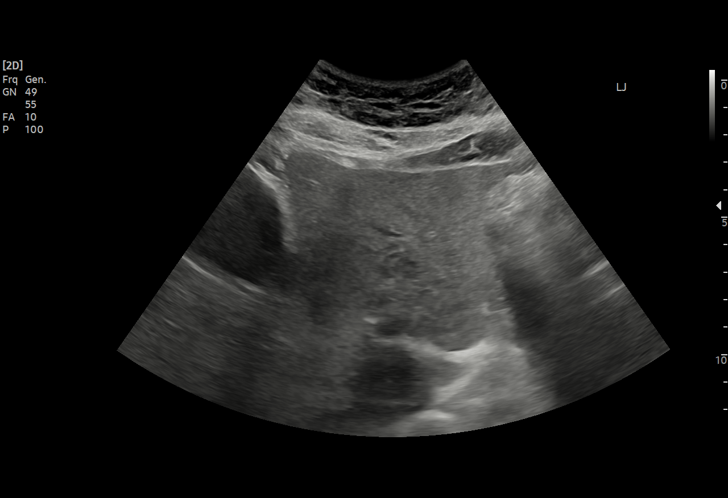
[im 57/69]
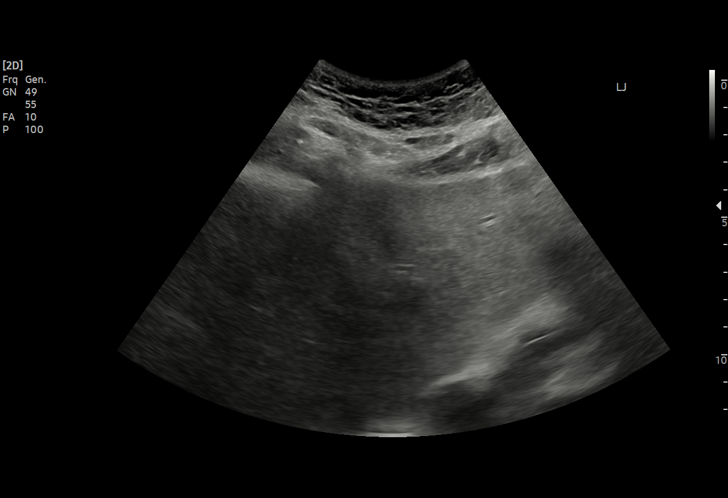
[im 63/69]
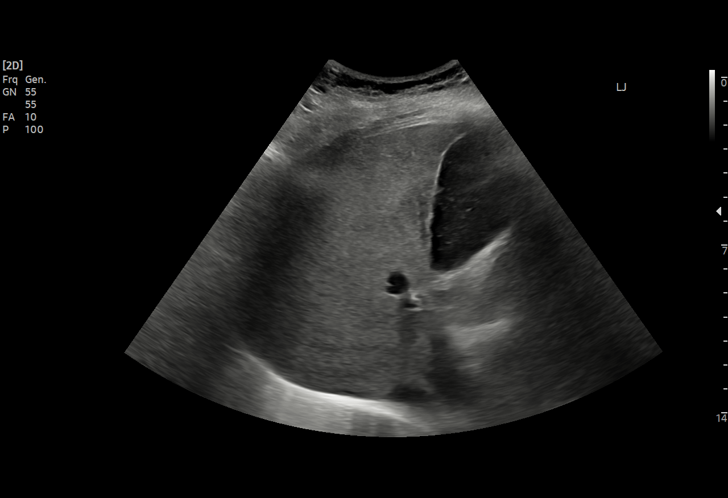
[im 69/69]
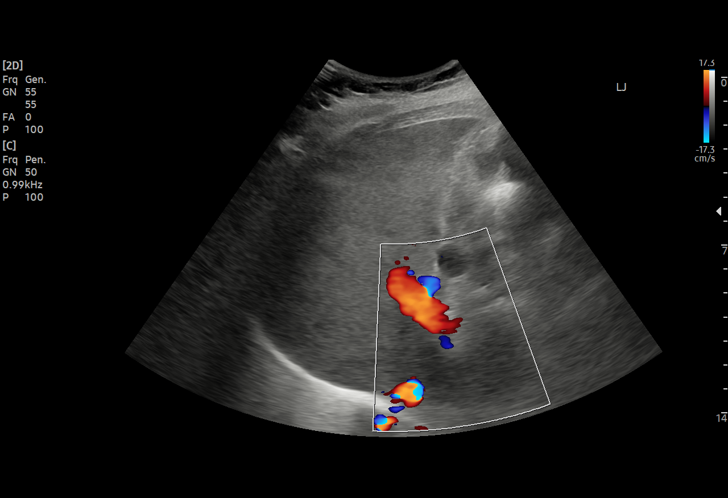

[15 of 25 positions shown; findings below may reference images not displayed]

FINDINGS: Gallbladder:

Gallbladder distended by sludge. No definite shadowing calculi or
gallbladder wall thickening. No pericholecystic fluid or sonographic
Murphy sign.

Common bile duct:

Diameter: Suboptimally visualized, question 5 mm diameter

Liver:

Echogenic parenchyma, likely fatty infiltration though this can be
seen with cirrhosis and certain infiltrative disorders. No gross
hepatic mass or nodularity are identified though assessment of
intrahepatic detail is suboptimal due to body habitus, bowel gas,
and respiration. Portal vein is patent on color Doppler imaging with
normal direction of blood flow towards the liver.

Other: No RIGHT upper quadrant free fluid.
IMPRESSION: Gallbladder distended by sludge.

Probable fatty infiltration of liver as above.

## 2021-03-31 NOTE — Progress Notes (Addendum)
Physical Therapy Treatment Patient Details Name: Carrie [name] MRN: [id number] DOB: 12/29/1960 Today's Date: 03/31/2021   History of Present Illness 60 year old female, lives alone, medical history significant for hypertension, hyperlipidemia, depression, sustained a mechanical fall at home on 02/01/2021 after missing a step.  Patient  has continued to feel progressively weak, generalized pains, dizzy without vertigo, no syncope or near syncope and presented to the ED 03/26/21.Admitted for presyncope, dehydration       and generalized weakness.  Rheumatological work-up initiated.  Rheumatoid factor markedly elevated.  Initiated prednisone    PT Comments    The patient is more alert and able to participate . Patient ambulated   X 60' using Rw and min assistance, Patient required mod steady assistance without Rw, patient rweaching for wall, bed for support.  HR 122 post ambulation, noted 3/4 dyspnea. Patient does present with decreased endurance and deemed a fall risk without support and  assistance.  Patient currently requires caregivers for safety.  Recommendations for follow up therapy are one component of a multi-disciplinary discharge planning process, led by the attending physician.  Recommendations may be updated based on patient status, additional functional criteria and insurance authorization.  Follow Up Recommendations  Skilled nursing-short term rehab (<3 hours/day)     Assistance Recommended at Discharge None  Equipment Recommendations  None recommended by PT    Recommendations for Other Services       Precautions / Restrictions Precautions Precautions: Fall Precaution Comments: fatigues quickly     Mobility  Bed Mobility Overal bed mobility: Needs Assistance       Supine to sit: Mod assist     General bed mobility comments: increased time and mod A for trunk elevation. Pt able to walk legs over to the side of bed    Transfers Overall transfer level: Needs  assistance Equipment used: Rolling walker (2 wheels) Transfers: Sit to/from Stand Sit to Stand: Min assist           General transfer comment: steady assist to rise vc for safe hand placement    Ambulation/Gait Ambulation/Gait assistance: Min assist Gait Distance (Feet): 60 Feet (then 20' in room without Rw, mod steady support, pt. reaching for wall and bed,etc.for support.) Assistive device: Rolling walker (2 wheels) Gait Pattern/deviations: Step-to pattern;Step-through pattern Gait velocity: decr   General Gait Details: gait slow/sluggish.   Stairs             Wheelchair Mobility    Modified Rankin (Stroke Patients Only)       Balance Overall balance assessment: History of Falls;Needs assistance Sitting-balance support: No upper extremity supported;Feet supported Sitting balance-Leahy Scale: Good     Standing balance support: During functional activity;Bilateral upper extremity supported;No upper extremity supported Standing balance-Leahy Scale: Poor Standing balance comment: reliant on UE support                            Cognition Arousal/Alertness: Awake/alert Behavior During Therapy: WFL for tasks assessed/performed                                 Problem Solving: Decreased initiation;Requires verbal cues General Comments: much improved in alertness and arousal        Exercises      General Comments        Pertinent Vitals/Pain Pain Assessment: Faces Faces Pain Scale: Hurts little more Pain Location: thighs Pain Descriptors /  Indicators: Aching Pain Intervention(s): Monitored during session    Home Living                          Prior Function            PT Goals (current goals can now be found in the care plan section) Progress towards PT goals: Progressing toward goals    Frequency    Min 2X/week      PT Plan Current plan remains appropriate;Frequency needs to be updated     Co-evaluation              AM-PAC PT "6 Clicks" Mobility   Outcome Measure  Help needed turning from your back to your side while in a flat bed without using bedrails?: A Little Help needed moving from lying on your back to sitting on the side of a flat bed without using bedrails?: A Lot Help needed moving to and from a bed to a chair (including a wheelchair)?: A Lot Help needed standing up from a chair using your arms (e.g., wheelchair or bedside chair)?: A Lot Help needed to walk in hospital room?: A Lot Help needed climbing 3-5 steps with a railing? : Total 6 Click Score: 12    End of Session Equipment Utilized During Treatment: Gait belt Activity Tolerance: Patient limited by fatigue Patient left: in chair;with call bell/phone within reach;with chair alarm set Nurse Communication: Mobility status PT Visit Diagnosis: Unsteadiness on feet (R26.81);History of falling (Z91.81)     Time: 0383-3383 PT Time Calculation (min) (ACUTE ONLY): 24 min  Charges:  $Gait Training: 23-37 mins                     Blanchard Kelch PT Acute Rehabilitation Services Pager [phone] Office [phone]    [name] 03/31/2021, 3:04 PM

## 2021-03-31 NOTE — Progress Notes (Signed)
PROGRESS NOTE   Carrie [name]  SWF:[id number]    DOB: 03-17-1961    DOA: 03/26/2021  PCP: Ann Held, DO   I have briefly reviewed patients previous medical records in [facility].  Chief Complaint  Patient presents with   Fall    Brief Narrative:  60 year old female, lives alone, medical history significant for hypertension, hyperlipidemia, depression, sustained a mechanical fall at home on 02/01/2021 after missing a step.  She had no LOC.  She did not seek immediate medical attention.  However over the next 2 weeks, she noted progressive generalized weakness and pain for which she was seen at an urgent care and prescribed muscle relaxants and NSAIDs with some relief.  However since then, she has continued to feel progressively weak, generalized pains, dizzy without vertigo, no syncope or near syncope and presented to the ED.  She has some rash over the upper chest which preceded her fall.  Initially admitted for presyncope, dehydration and generalized weakness.  Rheumatological work-up initiated.  Rheumatoid factor markedly elevated.  Initiated prednisone.  Slowly improving.   Assessment & Plan:  Principal Problem:   Pre-syncope Active Problems:   Hyperlipidemia LDL goal <100   Depression, major, single episode, moderate (HCC)   Generalized weakness   Joint pain   Non-traumatic rhabdomyolysis   Generalized pain/arthralgia and profound weakness/?  Rheumatoid arthritis: - Unclear etiology.  Although patient attributes it all to her fall, that was almost 2 months ago and she has only progressively gotten weaker.   - CK is mildly elevated.  She does have a almost healed mid upper anterior chest skin lesion which does not clearly seem like an autoimmune type but uncertain.  I do not appreciate a malar rash. - Autoimmune etiology is of concern. - Discussed with Dr. Kathlene November, rheumatology on the phone on 10/20.  As per recommendations, checked following: Ordered ESR: 26, CRP:  2.5, RA: 101.4 (markedly elevated), ANA (positive), CCP (13/negative).  Ribonucleoprotein elevated-?  Mixed connective tissue disease versus SLE versus polymyositis and/or dermatomyositis. - Discussed with him again on the phone with updated results as above on 10/21.  He is concerned about rheumatoid arthritis.  Given her profound weakness and ongoing diffuse pains and finger swelling-inability to use, recommend started prednisone 20 Mg daily (10/21) x1 week then taper over the next few days to zero around 4 to 5 days before office visit with him. - Given elevated CK, hold statins for now. - X-rays of left hip and knee negative. - Slowly improving with decreased pain, increased strength and mobility.  PT and OT follow-up recommended SNF.  Patient medically stable for discharge and outpatient follow-up.  Consulted TOC for SNF.  Dizziness/?  Presyncope - Unclear etiology. - Orthostatic vitals checked and negative. - TSH normal.  Telemetry shows low voltage but normal sinus rhythm without arrhythmias or pauses. - Some of this could have been due to dehydration, received IV fluids, clinically appears euvolemic. - 2D echo: LVEF 60-65%.  No regional wall motion abnormalities.  Grade 1 diastolic dysfunction.  No aortic stenosis.  Elevated CK - Mild rhabdomyolysis versus other etiologies i.e. polymyositis etc. - CK down to 484.  Hold statins. - Autoimmune work-up as above.  May need other evaluation, probably as outpatient. - Completed IV fluids.  CK has normalized.  Abnormal LFTs - Mildly elevated AST and ALT may be related to elevated CK versus other etiologies.  No GI symptoms. - Hold statins for now. - Have slightly increased compared  to 3 days ago.  RUQ ultrasound shows distended gallbladder with sludge and fatty liver.  Patient has no symptoms of abdominal pain or GI symptoms. - Outpatient follow-up.  Hypokalemia - Replaced.  Magnesium 2.  Profound weakness/functional quadriplegia/fall at  home - CT head and C-spine negative in ED. - Awaiting PT and OT evaluation.  It is possible that she may need SNF for short-term rehab.  Does not have 24/7 assistance at home. - PT and OT follow-up appreciated, recommend SNF.  Skin rash - Unclear etiology but seems to be healing. - Outpatient rheumatology consultation versus dermatology evaluation.  Essential hypertension - Controlled on amlodipine and lisinopril.  Hyperlipidemia - Hold atorvastatin.  Depression - On bupropion and Lexapro, continue. - As per sister, patient reportedly sleeps a lot, has been drowsy which patient indicates was due to her muscle relaxants which are not continued in the hospital. - Appears to be stable on these meds.  Confusion/?  Cognitive impairment - Nursing reports some confusion through the night. - Patient's neck sister who is a retired PA indicates that she has been having some confusion even prior to admission. - Recommend outpatient neuropsychiatric consultation. - TSH normal.  B12 levels high.  RPR nonreactive. - Patient has been quite coherent on my evaluation.  Body mass index is 29.4 kg/m.    DVT prophylaxis: enoxaparin (LOVENOX) injection 40 mg Start: 03/26/21 2230     Code Status: Full Code Family Communication: None at bedside. Disposition:  Status is: Inpatient  Medically stable for DC to SNF pending bed        Consultants:   None  Procedures:   None  Antimicrobials:    Anti-infectives (From admission, onward)    None         Subjective:  Reports feeling stronger and able to do more.  Actually reports no pain.  No other complaints.  Objective:   Vitals:   03/30/21 1415 03/30/21 2108 03/31/21 0458 03/31/21 0500  BP: 118/78 116/85 121/86   Pulse: (!) 109 (!) 103 (!) 104   Resp: $Remo'17 16 16   'vYWPW$ Temp: 98.4 F (36.9 C) 97.9 F (36.6 C) 97.7 F (36.5 C)   TempSrc:  Oral Oral   SpO2: 96% 96% 97%   Weight:    77.7 kg  Height:        General exam:  Middle-age female, moderately built and overweight sitting up comfortably in bed without distress. Respiratory system: Clear to auscultation.  No increased work of breathing. Cardiovascular system: S1 and S2 heard, RRR.  No JVD, murmurs or pedal edema.  Off telemetry now. Gastrointestinal system: Abdomen is nondistended, soft and nontender. No organomegaly or masses felt. Normal bowel sounds heard. Central nervous system: Alert and oriented. No focal neurological deficits. Extremities: Proximally/bilateral shoulders and hips, grade 4+ by 5 power.  Distally normal power.  Bilateral fingers boggy swelling seems to be slightly better.  Left knee without acute findings. Skin: No rashes, lesions or ulcers Psychiatry: Judgement and insight appear normal. Mood & affect has been pleasant and interactive and communicative for the last couple days.    Data Reviewed:   I have personally reviewed following labs and imaging studies   CBC: Recent Labs  Lab 03/26/21 1712  WBC 6.4  NEUTROABS 5.7  HGB 14.5  HCT 42.6  MCV 91.6  PLT 329    Basic Metabolic Panel: Recent Labs  Lab 03/27/21 0520 03/27/21 1627 03/28/21 0454 03/31/21 0505  NA 134*  --  136 134*  K 3.4*  --  4.4 3.8  CL 111  --  112* 106  CO2 19*  --  18* 22  GLUCOSE 111*  --  104* 96  BUN 14  --  9 13  CREATININE 0.76  --  0.80 0.87  CALCIUM 8.2*  --  8.5* 8.8*  MG  --  2.0  --   --     Liver Function Tests: Recent Labs  Lab 03/27/21 0520 03/28/21 0454 03/31/21 0505  AST 118* 107* 150*  ALT 87* 77* 135*  ALKPHOS 64 63 71  BILITOT 0.9 0.8 0.8  PROT 6.2* 6.3* 6.6  ALBUMIN 2.6* 2.6* 2.9*    CBG: Recent Labs  Lab 03/27/21 0458 03/28/21 0505 03/29/21 0418  GLUCAP 114* 91 103*    Microbiology Studies:   Recent Results (from the past 240 hour(s))  Resp Panel by RT-PCR (Flu A&B, Covid) Nasopharyngeal Swab     Status: None   Collection Time: 03/26/21 10:14 PM   Specimen: Nasopharyngeal Swab; Nasopharyngeal(NP)  swabs in vial transport medium  Result Value Ref Range Status   SARS Coronavirus 2 by RT PCR NEGATIVE NEGATIVE Final    Comment: (NOTE) SARS-CoV-2 target nucleic acids are NOT DETECTED.  The SARS-CoV-2 RNA is generally detectable in upper respiratory specimens during the acute phase of infection. The lowest concentration of SARS-CoV-2 viral copies this assay can detect is 138 copies/mL. A negative result does not preclude SARS-Cov-2 infection and should not be used as the sole basis for treatment or other patient management decisions. A negative result may occur with  improper specimen collection/handling, submission of specimen other than nasopharyngeal swab, presence of viral mutation(s) within the areas targeted by this assay, and inadequate number of viral copies(<138 copies/mL). A negative result must be combined with clinical observations, patient history, and epidemiological information. The expected result is Negative.  Fact Sheet for Patients:  EntrepreneurPulse.com.au  Fact Sheet for Healthcare Providers:  IncredibleEmployment.be  This test is no t yet approved or cleared by the [place] FDA and  has been authorized for detection and/or diagnosis of SARS-CoV-2 by FDA under an Emergency Use Authorization (EUA). This EUA will remain  in effect (meaning this test can be used) for the duration of the COVID-19 declaration under Section 564(b)(1) of the Act, 21 U.S.C.section 360bbb-3(b)(1), unless the authorization is terminated  or revoked sooner.       Influenza A by PCR NEGATIVE NEGATIVE Final   Influenza B by PCR NEGATIVE NEGATIVE Final    Comment: (NOTE) The Xpert Xpress SARS-CoV-2/FLU/RSV plus assay is intended as an aid in the diagnosis of influenza from Nasopharyngeal swab specimens and should not be used as a sole basis for treatment. Nasal washings and aspirates are unacceptable for Xpert Xpress  SARS-CoV-2/FLU/RSV testing.  Fact Sheet for Patients: EntrepreneurPulse.com.au  Fact Sheet for Healthcare Providers: IncredibleEmployment.be  This test is not yet approved or cleared by the [place] FDA and has been authorized for detection and/or diagnosis of SARS-CoV-2 by FDA under an Emergency Use Authorization (EUA). This EUA will remain in effect (meaning this test can be used) for the duration of the COVID-19 declaration under Section 564(b)(1) of the Act, 21 U.S.C. section 360bbb-3(b)(1), unless the authorization is terminated or revoked.  Performed at [facility], [place] [address], [place], [place] [address]      Radiology Studies:  DG Knee Complete 4 Views Left  Result Date: 03/29/2021 CLINICAL DATA:  Fall 3 weeks ago with ongoing pain in  the left hip and knee. EXAM: LEFT KNEE - COMPLETE 4+ VIEW COMPARISON:  None. FINDINGS: No evidence of fracture, dislocation, or joint effusion. 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 The soft tissues appear normal. IMPRESSION: No acute osseous injury. Electronically Signed   By: Zerita Boers M.D.   On: 03/29/2021 17:52   DG HIP UNILAT WITH PELVIS 2-3 VIEWS LEFT  Result Date: 03/29/2021 CLINICAL DATA:  Fall 3 weeks ago with ongoing pain in the left hip and knee. EXAM: DG HIP (WITH OR WITHOUT PELVIS) 2-3V LEFT COMPARISON:  None. FINDINGS: There is no evidence of hip fracture or dislocation. Mild-to-moderate degenerative changes are seen in both hips. IMPRESSION: No acute osseous injury. Electronically Signed   By: Zerita Boers M.D.   On: 03/29/2021 17:51   US Abdomen Limited RUQ (LIVER/GB)  Result Date: 03/31/2021 CLINICAL DATA:  Abnormal LFTs EXAM: ULTRASOUND ABDOMEN LIMITED RIGHT UPPER QUADRANT COMPARISON:  None FINDINGS: Gallbladder: Gallbladder distended by sludge. No definite shadowing calculi or gallbladder wall thickening. No  pericholecystic fluid or sonographic Murphy sign. Common bile duct: Diameter: Suboptimally visualized, question 5 mm diameter Liver: Echogenic parenchyma, likely fatty infiltration though this can be seen with cirrhosis and certain infiltrative disorders. No gross hepatic mass or nodularity are identified though assessment of intrahepatic detail is suboptimal due to body habitus, bowel gas, and respiration. Portal vein is patent on color Doppler imaging with normal direction of blood flow towards the liver. Other: No RIGHT upper quadrant free fluid. IMPRESSION: Gallbladder distended by sludge. Probable fatty infiltration of liver as above. Electronically Signed   By: Lavonia Dana M.D.   On: 03/31/2021 13:00     Scheduled Meds:    amLODipine  5 mg Oral Daily   buPROPion  300 mg Oral Daily   enoxaparin (LOVENOX) injection  40 mg Subcutaneous Q24H   escitalopram  20 mg Oral Daily   influenza vac split quadrivalent PF  0.5 mL Intramuscular Tomorrow-1000   lisinopril  30 mg Oral Daily   pantoprazole  40 mg Oral Daily   predniSONE  20 mg Oral Q breakfast   sodium chloride flush  3 mL Intravenous Q12H    Continuous Infusions:     LOS: 4 days     Vernell Leep, MD, FACP, [facility]. [facility]    To contact the attending provider between 7A-7P or the covering provider during after hours 7P-7A, please log into the web site [email or web address] and access using universal [facility] password for that web site. If you do not have the password, please call the hospital operator.  03/31/2021, 2:37 PM

## 2021-04-01 DIAGNOSIS — R531 Weakness: Secondary | ICD-10-CM | POA: Diagnosis not present

## 2021-04-01 DIAGNOSIS — R55 Syncope and collapse: Secondary | ICD-10-CM | POA: Diagnosis not present

## 2021-04-01 LAB — HEPATIC FUNCTION PANEL
ALT: 101 U/L — ABNORMAL HIGH (ref 0–44)
AST: 98 U/L — ABNORMAL HIGH (ref 15–41)
Albumin: 2.8 g/dL — ABNORMAL LOW (ref 3.5–5.0)
Alkaline Phosphatase: 60 U/L (ref 38–126)
Bilirubin, Direct: 0.2 mg/dL (ref 0.0–0.2)
Indirect Bilirubin: 0.5 mg/dL (ref 0.3–0.9)
Total Bilirubin: 0.7 mg/dL (ref 0.3–1.2)
Total Protein: 6.5 g/dL (ref 6.5–8.1)

## 2021-04-01 NOTE — Progress Notes (Signed)
PROGRESS NOTE   Carrie [name]  MRN:[id number]    DOB: 11/02/1960    DOA: 03/26/2021  PCP: Lowne Chase, Yvonne R, DO   I have briefly reviewed patients previous medical records in [facility].  Chief Complaint  Patient presents with   Fall    Brief Narrative:  60-year-old female, lives alone, medical history significant for hypertension, hyperlipidemia, depression, sustained a mechanical fall at home on 02/01/2021 after missing a step.  She had no LOC.  She did not seek immediate medical attention.  However over the next 2 weeks, she noted progressive generalized weakness and pain for which she was seen at an urgent care and prescribed muscle relaxants and NSAIDs with some relief.  However since then, she has continued to feel progressively weak, generalized pains, dizzy without vertigo, no syncope or near syncope and presented to the ED.  She has some rash over the upper chest which preceded her fall.  Initially admitted for presyncope, dehydration and generalized weakness.  Rheumatological work-up initiated.  Rheumatoid factor markedly elevated.  Initiated prednisone.  Slowly improving.  Medically optimized for DC to SNF.  TOC on board.  TOC also exploring if she would be appropriate for CIR.   Assessment & Plan:  Principal Problem:   Pre-syncope Active Problems:   Hyperlipidemia LDL goal <100   Depression, major, single episode, moderate (HCC)   Generalized weakness   Joint pain   Non-traumatic rhabdomyolysis   Generalized pain/arthralgia and profound weakness/?  Rheumatoid arthritis: - Unclear etiology.  Although patient attributes it all to her fall, that was almost 2 months ago and she has only progressively gotten weaker.   - CK was mildly elevated.  She does have a almost healed mid upper anterior chest skin lesion which does not clearly seem like an autoimmune type but uncertain.  I do not appreciate a malar rash. - Autoimmune etiology is of concern. - Discussed with Dr.  Aryal, Rheumatology on the phone on 10/20.  As per recommendations, checked following: Ordered ESR: 26, CRP: 2.5, RA: 101.4 (markedly elevated), ANA (positive), CCP (13/negative).  Ribonucleoprotein elevated-?  Mixed connective tissue disease versus SLE versus polymyositis and/or dermatomyositis. - Discussed with him again on the phone on 10/21 with results of rheumatoid factor alone.  Other results were not back yet.  He was concerned about rheumatoid arthritis.  Given her profound weakness and ongoing diffuse pains and finger swelling-inability to use, recommend as per recommendations started started prednisone 20 Mg daily (10/21) x1 week then taper over the next few days to zero around 4 to 5 days before office visit with him. - Given elevated CK, hold statins for now.  CK has since normalized. - X-rays of left hip and knee negative. - Slowly improving with decreased pain, increased strength and mobility.  PT and OT follow-up recommended SNF.  Patient medically stable for discharge and outpatient follow-up.  Consulted TOC for SNF. - Patient's sister who used to work at [employer] asking if patient can go to CIR.  Discussed with TOC team who will explore with PT.  Dizziness/?  Presyncope - Unclear etiology. - Orthostatics negative. - TSH normal.  Telemetry shows low voltage but normal sinus rhythm without arrhythmias or pauses. - Some of this could have been due to dehydration, received IV fluids, clinically appears euvolemic. - 2D echo: LVEF 60-65%.  No regional wall motion abnormalities.  Grade 1 diastolic dysfunction.  No aortic stenosis.  Elevated CK - Mild rhabdomyolysis versus other etiologies i.e.   polymyositis etc. - CK down to 484.  Hold statins. - Autoimmune work-up as above.  Will need outpatient evaluation with rheumatology. - Completed IV fluids.  CK has normalized.  Abnormal LFTs - Mildly elevated AST and ALT may be related to elevated CK versus other etiologies.  No GI  symptoms. - Hold statins for now. - RUQ ultrasound shows distended gallbladder with sludge and fatty liver.  Patient has no symptoms of abdominal pain or GI symptoms.  I discussed with the surgical team on 10/24, no intervention at this time and needs to be followed as outpatient. - Acute hepatitis panel negative. - Outpatient follow-up.  Hypokalemia - Replaced.  Magnesium 2.  Profound weakness/functional quadriplegia/fall at home - CT head and C-spine negative in ED. - PT and OT follow-up appreciated, recommend SNF.  Skin rash - Unclear etiology but seems to be healing. - Outpatient rheumatology consultation versus dermatology evaluation.  Essential hypertension - Controlled on amlodipine and lisinopril.  Hyperlipidemia - Hold atorvastatin.  Depression - On bupropion and Lexapro, continue. - Stable on these meds while inpatient.  Confusion/?  Cognitive impairment - Patient's neck sister who is a retired PA indicates that she has been having some confusion even prior to admission. - Recommend outpatient neuropsychiatric consultation. - TSH normal.  B12 levels high.  RPR nonreactive. - Patient has been quite coherent on my evaluation.?  Resolved  Body mass index is 29.4 kg/m.    DVT prophylaxis: enoxaparin (LOVENOX) injection 40 mg Start: 03/26/21 2230     Code Status: Full Code Family Communication: Discussed in detail with patient's 2 sisters at bedside, updated care and answered all questions. Disposition:  Status is: Inpatient  Medically stable for DC to SNF versus CIR (if felt appropriate) pending bed        Consultants:   None  Procedures:   None  Antimicrobials:    Anti-infectives (From admission, onward)    None         Subjective:  Mild diffuse body aches, intermittent.  Strength overall has improved compared to admission.  Denies abdominal pain, nausea or vomiting.  No other complaints reported.  Objective:   Vitals:   03/31/21 2028  04/01/21 0507 04/01/21 0800 04/01/21 1423  BP: 119/70 112/74 119/78 108/82  Pulse: (!) 104 (!) 102 (!) 101 (!) 110  Resp: 16 14 16 20  Temp: 97.9 F (36.6 C) (!) 97.5 F (36.4 C) 97.6 F (36.4 C) 97.8 F (36.6 C)  TempSrc: Oral Oral Oral Oral  SpO2: 96% 94% 93% 93%  Weight:      Height:        General exam: Middle-age female, moderately built and overweight sitting up comfortably in bed without distress.  Family at bedside. Respiratory system: Clear to auscultation.  No increased work of breathing. Cardiovascular system: S1 and S2 heard, RRR.  No JVD, murmurs or pedal edema.  Off telemetry now. Gastrointestinal system: Abdomen is nondistended, soft and nontender.  Normal bowel sounds heard.  No organomegaly or masses appreciated. Central nervous system: Alert and oriented. No focal neurological deficits. Extremities: Proximally/bilateral shoulders and hips, grade 4+ by 5 power.  Distally normal power.  Bilateral fingers boggy swelling seems to be slightly better.  Left knee without acute findings. Skin: No rashes, lesions or ulcers Psychiatry: Judgement and insight appear normal. Mood & affect has been pleasant and interactive and communicative for the last couple days.    Data Reviewed:   I have personally reviewed following labs and imaging studies     CBC: Recent Labs  Lab 03/26/21 1712  WBC 6.4  NEUTROABS 5.7  HGB 14.5  HCT 42.6  MCV 91.6  PLT 656    Basic Metabolic Panel: Recent Labs  Lab 03/27/21 0520 03/27/21 1627 03/28/21 0454 03/31/21 0505  NA 134*  --  136 134*  K 3.4*  --  4.4 3.8  CL 111  --  112* 106  CO2 19*  --  18* 22  GLUCOSE 111*  --  104* 96  BUN 14  --  9 13  CREATININE 0.76  --  0.80 0.87  CALCIUM 8.2*  --  8.5* 8.8*  MG  --  2.0  --   --     Liver Function Tests: Recent Labs  Lab 03/28/21 0454 03/31/21 0505 04/01/21 0530  AST 107* 150* 98*  ALT 77* 135* 101*  ALKPHOS 63 71 60  BILITOT 0.8 0.8 0.7  PROT 6.3* 6.6 6.5  ALBUMIN  2.6* 2.9* 2.8*    CBG: Recent Labs  Lab 03/27/21 0458 03/28/21 0505 03/29/21 0418  GLUCAP 114* 91 103*    Microbiology Studies:   Recent Results (from the past 240 hour(s))  Resp Panel by RT-PCR (Flu A&B, Covid) Nasopharyngeal Swab     Status: None   Collection Time: 03/26/21 10:14 PM   Specimen: Nasopharyngeal Swab; Nasopharyngeal(NP) swabs in vial transport medium  Result Value Ref Range Status   SARS Coronavirus 2 by RT PCR NEGATIVE NEGATIVE Final    Comment: (NOTE) SARS-CoV-2 target nucleic acids are NOT DETECTED.  The SARS-CoV-2 RNA is generally detectable in upper respiratory specimens during the acute phase of infection. The lowest concentration of SARS-CoV-2 viral copies this assay can detect is 138 copies/mL. A negative result does not preclude SARS-Cov-2 infection and should not be used as the sole basis for treatment or other patient management decisions. A negative result may occur with  improper specimen collection/handling, submission of specimen other than nasopharyngeal swab, presence of viral mutation(s) within the areas targeted by this assay, and inadequate number of viral copies(<138 copies/mL). A negative result must be combined with clinical observations, patient history, and epidemiological information. The expected result is Negative.  Fact Sheet for Patients:  EntrepreneurPulse.com.au  Fact Sheet for Healthcare Providers:  IncredibleEmployment.be  This test is no t yet approved or cleared by the [place] FDA and  has been authorized for detection and/or diagnosis of SARS-CoV-2 by FDA under an Emergency Use Authorization (EUA). This EUA will remain  in effect (meaning this test can be used) for the duration of the COVID-19 declaration under Section 564(b)(1) of the Act, 21 U.S.C.section 360bbb-3(b)(1), unless the authorization is terminated  or revoked sooner.       Influenza A by PCR NEGATIVE NEGATIVE  Final   Influenza B by PCR NEGATIVE NEGATIVE Final    Comment: (NOTE) The Xpert Xpress SARS-CoV-2/FLU/RSV plus assay is intended as an aid in the diagnosis of influenza from Nasopharyngeal swab specimens and should not be used as a sole basis for treatment. Nasal washings and aspirates are unacceptable for Xpert Xpress SARS-CoV-2/FLU/RSV testing.  Fact Sheet for Patients: EntrepreneurPulse.com.au  Fact Sheet for Healthcare Providers: IncredibleEmployment.be  This test is not yet approved or cleared by the [place] FDA and has been authorized for detection and/or diagnosis of SARS-CoV-2 by FDA under an Emergency Use Authorization (EUA). This EUA will remain in effect (meaning this test can be used) for the duration of the COVID-19 declaration under Section 564(b)(1) of the Act, 21 U.S.C.  section 360bbb-3(b)(1), unless the authorization is terminated or revoked.  Performed at [facility], [address] [place], [place] [address]      Radiology Studies:  US Abdomen Limited RUQ (LIVER/GB)  Result Date: 03/31/2021 CLINICAL DATA:  Abnormal LFTs EXAM: ULTRASOUND ABDOMEN LIMITED RIGHT UPPER QUADRANT COMPARISON:  None FINDINGS: Gallbladder: Gallbladder distended by sludge. No definite shadowing calculi or gallbladder wall thickening. No pericholecystic fluid or sonographic Murphy sign. Common bile duct: Diameter: Suboptimally visualized, question 5 mm diameter Liver: Echogenic parenchyma, likely fatty infiltration though this can be seen with cirrhosis and certain infiltrative disorders. No gross hepatic mass or nodularity are identified though assessment of intrahepatic detail is suboptimal due to body habitus, bowel gas, and respiration. Portal vein is patent on color Doppler imaging with normal direction of blood flow towards the liver. Other: No RIGHT upper quadrant free fluid. IMPRESSION: Gallbladder distended by sludge.  Probable fatty infiltration of liver as above. Electronically Signed   By: Mark  Boles M.D.   On: 03/31/2021 13:00     Scheduled Meds:    amLODipine  5 mg Oral Daily   buPROPion  300 mg Oral Daily   enoxaparin (LOVENOX) injection  40 mg Subcutaneous Q24H   escitalopram  20 mg Oral Daily   lisinopril  30 mg Oral Daily   pantoprazole  40 mg Oral Daily   predniSONE  20 mg Oral Q breakfast   sodium chloride flush  3 mL Intravenous Q12H    Continuous Infusions:     LOS: 5 days      , MD, FACP, SFHM. [facility]    To contact the attending provider between 7A-7P or the covering provider during after hours 7P-7A, please log into the web site [email or web address] and access using universal [place] password for that web site. If you do not have the password, please call the hospital operator.  04/01/2021, 2:49 PM   

## 2021-04-01 NOTE — Progress Notes (Addendum)
Physical Therapy Treatment Patient Details Name: SELENIA MIHOK MRN: [id number] DOB: 1961-05-30 Today's Date: 04/01/2021   History of Present Illness 60 year old female  sustained a mechanical fall at home on 02/01/2021 after missing a step. Patient  has continued to feel progressively weak, generalized pains, dizzy without vertigo, no syncope or near syncope and presented to the ED 03/26/21. Admitted for presyncope, dehydration and generalized weakness.  Rheumatological work-up initiated.  Rheumatoid factor markedly elevated.  Initiated prednisone. PMH: HTN, hyperlipidemia, depression    PT Comments    Pt initially hesitant to ambulate, reports fatigue and wanting to rest but motivated to regain strength. Pt ambulates to restroom with RW and min guard, able to release UE to wash hands at sink without LOB. Pt fatigues with ambulation in hallway, requests to return to room for seated rest break. Pt fatigues easily with mobility, recovers quickly with seated rest breaks. Pt reports fatigue today and thinks it is due to pain medication. Pt may benefit from CIR, motivated to return to independent status at home alone, is highly motivated and participates well with therapy. Will continue to progress as able.    Recommendations for follow up therapy are one component of a multi-disciplinary discharge planning process, led by the attending physician.  Recommendations may be updated based on patient status, additional functional criteria and insurance authorization.  Follow Up Recommendations  Acute inpatient rehab (3hours/day)     Assistance Recommended at Discharge    Equipment Recommendations  None recommended by PT    Recommendations for Other Services       Precautions / Restrictions Precautions Precautions: Fall Precaution Comments: fatigues quickly Restrictions Weight Bearing Restrictions: No     Mobility  Bed Mobility Overal bed mobility: Needs Assistance Bed Mobility: Supine to  Sit  Supine to sit: Mod assist  General bed mobility comments: mod A to upright trunk and slide BLE over towards EOB, requires increased time to inch BLE    Transfers Overall transfer level: Needs assistance Equipment used: Rolling walker (2 wheels) Transfers: Sit to/from Stand Sit to Stand: Min assist General transfer comment: min A to power to stand from EOB and toilet, slow to rise    Ambulation/Gait Ambulation/Gait assistance: Min guard Gait Distance (Feet): 50 Feet (+additional 15 ft in room) Assistive device: Rolling walker (2 wheels) Gait Pattern/deviations: Step-through pattern;Decreased stride length;Trunk flexed Gait velocity: decreased   General Gait Details: slow step to pattern with trunk slightly flexed over RW, no overt LOB but slightly unsteady, limited by fatigue   Stairs             Wheelchair Mobility    Modified Rankin (Stroke Patients Only)       Balance Overall balance assessment: History of Falls;Needs assistance Sitting-balance support: No upper extremity supported;Feet supported Sitting balance-Leahy Scale: Good  Standing balance support: During functional activity;Bilateral upper extremity supported;No upper extremity supported Standing balance-Leahy Scale: Poor Standing balance comment: reliant on UE support; static able to release UE from RW to wash hands     Cognition Arousal/Alertness: Awake/alert Behavior During Therapy: Flat affect Overall Cognitive Status: No family/caregiver present to determine baseline cognitive functioning  General Comments: pt flat, reports pain medication making her sleepy today, follows commands appropriately and somewhat conversational        Exercises      General Comments        Pertinent Vitals/Pain Pain Assessment: No/denies pain    Home Living  Prior Function            PT Goals (current goals can now be found in the care plan section) Acute Rehab PT  Goals Patient Stated Goal: to sleep PT Goal Formulation: With patient/family Time For Goal Achievement: 04/11/21 Potential to Achieve Goals: Good Progress towards PT goals: Progressing toward goals    Frequency    Min 2X/week      PT Plan Current plan remains appropriate;Frequency needs to be updated    Co-evaluation              AM-PAC PT "6 Clicks" Mobility   Outcome Measure  Help needed turning from your back to your side while in a flat bed without using bedrails?: A Little Help needed moving from lying on your back to sitting on the side of a flat bed without using bedrails?: A Lot Help needed moving to and from a bed to a chair (including a wheelchair)?: A Lot Help needed standing up from a chair using your arms (e.g., wheelchair or bedside chair)?: A Lot Help needed to walk in hospital room?: A Lot Help needed climbing 3-5 steps with a railing? : Total 6 Click Score: 12    End of Session Equipment Utilized During Treatment: Gait belt Activity Tolerance: Patient limited by fatigue Patient left: in chair;with call bell/phone within reach;with chair alarm set Nurse Communication: Mobility status PT Visit Diagnosis: Unsteadiness on feet (R26.81);History of falling (Z91.81)     Time: 2725-3664 PT Time Calculation (min) (ACUTE ONLY): 18 min  Charges:  $Gait Training: 8-22 mins                      Tori Maya Scholer PT, DPT 04/01/21, 2:58 PM

## 2021-04-02 DIAGNOSIS — R55 Syncope and collapse: Secondary | ICD-10-CM | POA: Diagnosis not present

## 2021-04-02 LAB — HEPATIC FUNCTION PANEL
ALT: 85 U/L — ABNORMAL HIGH (ref 0–44)
AST: 75 U/L — ABNORMAL HIGH (ref 15–41)
Albumin: 2.8 g/dL — ABNORMAL LOW (ref 3.5–5.0)
Alkaline Phosphatase: 62 U/L (ref 38–126)
Bilirubin, Direct: 0.1 mg/dL (ref 0.0–0.2)
Indirect Bilirubin: 0.6 mg/dL (ref 0.3–0.9)
Total Bilirubin: 0.7 mg/dL (ref 0.3–1.2)
Total Protein: 6.6 g/dL (ref 6.5–8.1)

## 2021-04-02 LAB — CREATININE, SERUM
Creatinine, Ser: 0.87 mg/dL (ref 0.44–1.00)
GFR, Estimated: >60 mL/min (ref >60)

## 2021-04-02 MED ORDER — PHENOL 1.4 % MT LIQD
1.0000 | OROMUCOSAL | Status: DC | PRN
  Filled 2021-04-02 (×2): qty 177

## 2021-04-02 NOTE — Progress Notes (Signed)
Inpatient Rehab Admissions Coordinator:   I spoke to Pt. Regarding potential CIR admit. She states interest, but will need mod I goals, as she has only intermittent support. I will open a case with her insurance and follow for potential admit pending insurance auth and bed availability.  Megan Salon, MS, CCC-SLP Rehab Admissions Coordinator  [phone] (celll) [phone] (office)

## 2021-04-02 NOTE — Progress Notes (Signed)
   04/02/21 1337  Mobility  Activity Ambulated in room;Ambulated to bathroom;Transferred:  Chair to bed;Stood at bedside  Level of Assistance Contact guard assist, steadying assist  Assistive Device Front wheel walker  Distance Ambulated (ft) 50 ft  Mobility Out of bed for toileting;Ambulated with assistance in room  Mobility Response Tolerated well  Mobility performed by Mobility specialist  $Mobility charge 1 Mobility   Pt refused hallway mobility today, but requested aide in getting to the bathroom and back to bed. Pt required min A to stand from the chair. She used the RW to get from the chair to the bathroom. Once pt was finished, assisted her to the bed. She stated that she was feeling weaker than usual, and more tired. Pt requested she stay on MS list for future mobilization. Notified RN of session.    [facility] Mobility Specialist Acute Rehab Services Office: [phone]

## 2021-04-02 NOTE — Progress Notes (Signed)
PROGRESS NOTE    Carrie [name]  KDT:[id number] DOB: 12-Mar-1961 DOA: 03/26/2021 PCP: Ann Held, DO   Brief Narrative: Carrie [name] is a 60 y.o. female with a history of hypertension, hyperlipidemia, depression. Patient presented after a fall in setting of progressive weakness and pain. Workup significant for likely rheumatoid arthritis with improvement of symptoms on prednisone.   Assessment & Plan:   Principal Problem:   Pre-syncope Active Problems:   Hyperlipidemia LDL goal <100   Depression, major, single episode, moderate (HCC)   Generalized weakness   Joint pain   Non-traumatic rhabdomyolysis   Generalized pain Rheumatoid arthritis Workup inpatient was significant for elevated RA (101.4), positive ANA, negative CCP, mildly elevated ESR (26) and CRP (2.5). Outpatient rheumatology, Dr. Kathlene November, curbside consulted and recommended treatment for rheumatoid arthritis with prednisone and taper. -Prednisone (10/21) x1 week, then taper to zero over days around 4-5 days prior to office visit  Presyncope Unsure of etiology. No arrhythmias noted. Transthoracic Echocardiogram with normal EF.   Elevated CK Improved.  Elevated AST/ALT Possibly related to elevated CK.  Hypokalemia Repleted.  Skin rash Improved.  Primary hypertension Patient is on amlodipine and lisinopril as an outpatient. -Continue amlodipine and lisinopril  Hyperlipidemia On Lipitor as an outpatient which was held secondary to AST/ALT.   Depression -Continue Wellbutrin and Lexapro  Confusion Possibly cognitive impairment. Recommendation for outpatient neurology/neuropsychiatry consult. Normal TSH. Elevated B12.   DVT prophylaxis: Lovenox Code Status:   Code Status: Full Code Family Communication: None at bedside Disposition Plan: Discharge to CIR vs SNF pending bed availability. Medically stable for discharge.   Consultants:  None  Procedures:  TRANSTHORACIC ECHOCARDIOGRAM  (03/27/21) IMPRESSIONS     1. Left ventricular ejection fraction, by estimation, is 60 to 65%. The  left ventricle has normal function. The left ventricle has no regional  wall motion abnormalities. Left ventricular diastolic parameters are  consistent with Grade I diastolic  dysfunction (impaired relaxation).   2. Right ventricular systolic function is normal. The right ventricular  size is normal. Tricuspid regurgitation signal is inadequate for assessing  PA pressure.   3. The mitral valve is normal in structure. No evidence of mitral valve  regurgitation. No evidence of mitral stenosis.   4. The aortic valve is tricuspid. Aortic valve regurgitation is not  visualized. No aortic stenosis is present.   5. The inferior vena cava is normal in size with greater than 50%  respiratory variability, suggesting right atrial pressure of 3 mmHg.  Antimicrobials: None    Subjective: No concerns today. Joint pain is improved.  Objective: Vitals:   04/01/21 1423 04/01/21 2045 04/02/21 0522 04/02/21 1419  BP: 108/82 119/78 107/78 139/76  Pulse: (!) 110 (!) 101 96 86  Resp: $Remo'20 20 18 16  'BVafD$ Temp: 97.8 F (36.6 C) 97.9 F (36.6 C) 98 F (36.7 C) 98.2 F (36.8 C)  TempSrc: Oral  Oral Oral  SpO2: 93% 96% 98%   Weight:   77.6 kg   Height:        Intake/Output Summary (Last 24 hours) at 04/02/2021 1513 Last data filed at 04/02/2021 1428 Gross per 24 hour  Intake 1064 ml  Output --  Net 1064 ml   Filed Weights   03/30/21 0439 03/31/21 0500 04/02/21 0522  Weight: 79.2 kg 77.7 kg 77.6 kg    Examination:  General exam: Appears calm and comfortable Respiratory system: Clear to auscultation. Respiratory effort normal. Cardiovascular system: S1 & S2 heard, RRR. No murmurs,  rubs, gallops or clicks. Gastrointestinal system: Abdomen is nondistended, soft and nontender. No organomegaly or masses felt. Normal bowel sounds heard. Central nervous system: Alert and oriented. No focal  neurological deficits. Musculoskeletal: No edema. No calf tenderness Skin: No cyanosis. Psychiatry: Judgement and insight appear normal. Mood & affect appropriate.     Data Reviewed: I have personally reviewed following labs and imaging studies  CBC Lab Results  Component Value Date   WBC 6.4 03/26/2021   RBC 4.65 03/26/2021   HGB 14.5 03/26/2021   HCT 42.6 03/26/2021   MCV 91.6 03/26/2021   MCH 31.2 03/26/2021   PLT 253 03/26/2021   MCHC 34.0 03/26/2021   RDW 14.9 03/26/2021   LYMPHSABS 0.4 (L) 03/26/2021   MONOABS 0.3 03/26/2021   EOSABS 0.0 03/26/2021   BASOSABS 0.0 03/26/2021     Last metabolic panel Lab Results  Component Value Date   NA 134 (L) 03/31/2021   K 3.8 03/31/2021   CL 106 03/31/2021   CO2 22 03/31/2021   BUN 13 03/31/2021   CREATININE 0.87 04/02/2021   GLUCOSE 96 03/31/2021   GFRNONAA >60 04/02/2021   GFRAA  08/24/2008    >60        The eGFR has been calculated using the MDRD equation. This calculation has not been validated in all clinical situations. eGFR's persistently <60 mL/min signify possible Chronic Kidney Disease.   CALCIUM 8.8 (L) 03/31/2021   PROT 6.6 04/02/2021   ALBUMIN 2.8 (L) 04/02/2021   BILITOT 0.7 04/02/2021   ALKPHOS 62 04/02/2021   AST 75 (H) 04/02/2021   ALT 85 (H) 04/02/2021   ANIONGAP 6 03/31/2021    CBG (last 3)  No results for input(s): GLUCAP in the last 72 hours.   GFR: Estimated Creatinine Clearance: 67.6 mL/min (by C-G formula based on SCr of 0.87 mg/dL).  Coagulation Profile: No results for input(s): INR, PROTIME in the last 168 hours.  Recent Results (from the past 240 hour(s))  Resp Panel by RT-PCR (Flu A&B, Covid) Nasopharyngeal Swab     Status: None   Collection Time: 03/26/21 10:14 PM   Specimen: Nasopharyngeal Swab; Nasopharyngeal(NP) swabs in vial transport medium  Result Value Ref Range Status   SARS Coronavirus 2 by RT PCR NEGATIVE NEGATIVE Final    Comment: (NOTE) SARS-CoV-2 target  nucleic acids are NOT DETECTED.  The SARS-CoV-2 RNA is generally detectable in upper respiratory specimens during the acute phase of infection. The lowest concentration of SARS-CoV-2 viral copies this assay can detect is 138 copies/mL. A negative result does not preclude SARS-Cov-2 infection and should not be used as the sole basis for treatment or other patient management decisions. A negative result may occur with  improper specimen collection/handling, submission of specimen other than nasopharyngeal swab, presence of viral mutation(s) within the areas targeted by this assay, and inadequate number of viral copies(<138 copies/mL). A negative result must be combined with clinical observations, patient history, and epidemiological information. The expected result is Negative.  Fact Sheet for Patients:  BloggerCourse.com  Fact Sheet for Healthcare Providers:  SeriousBroker.it  This test is no t yet approved or cleared by the [place] FDA and  has been authorized for detection and/or diagnosis of SARS-CoV-2 by FDA under an Emergency Use Authorization (EUA). This EUA will remain  in effect (meaning this test can be used) for the duration of the COVID-19 declaration under Section 564(b)(1) of the Act, 21 U.S.C.section 360bbb-3(b)(1), unless the authorization is terminated  or revoked sooner.  Influenza A by PCR NEGATIVE NEGATIVE Final   Influenza B by PCR NEGATIVE NEGATIVE Final    Comment: (NOTE) The Xpert Xpress SARS-CoV-2/FLU/RSV plus assay is intended as an aid in the diagnosis of influenza from Nasopharyngeal swab specimens and should not be used as a sole basis for treatment. Nasal washings and aspirates are unacceptable for Xpert Xpress SARS-CoV-2/FLU/RSV testing.  Fact Sheet for Patients: EntrepreneurPulse.com.au  Fact Sheet for Healthcare  Providers: IncredibleEmployment.be  This test is not yet approved or cleared by the [place] FDA and has been authorized for detection and/or diagnosis of SARS-CoV-2 by FDA under an Emergency Use Authorization (EUA). This EUA will remain in effect (meaning this test can be used) for the duration of the COVID-19 declaration under Section 564(b)(1) of the Act, 21 U.S.C. section 360bbb-3(b)(1), unless the authorization is terminated or revoked.  Performed at [facility], [place] [address], [place], [place] [address]         Radiology Studies: No results found.      Scheduled Meds:  amLODipine  5 mg Oral Daily   buPROPion  300 mg Oral Daily   enoxaparin (LOVENOX) injection  40 mg Subcutaneous Q24H   escitalopram  20 mg Oral Daily   lisinopril  30 mg Oral Daily   pantoprazole  40 mg Oral Daily   predniSONE  20 mg Oral Q breakfast   sodium chloride flush  3 mL Intravenous Q12H   Continuous Infusions:   LOS: 6 days     Cordelia Poche, MD [facility] 04/02/2021, 3:13 PM  If 7PM-7AM, please contact night-coverage [email or web address]

## 2021-04-02 NOTE — Progress Notes (Signed)
Occupational Therapy Treatment Patient Details Name: Carrie [name] MRN: [id number] DOB: 09-26-1960 Today's Date: 04/02/2021   History of present illness 60 year old female  sustained a mechanical fall at home on 02/01/2021 after missing a step. Patient  has continued to feel progressively weak, generalized pains, dizzy without vertigo, no syncope or near syncope and presented to the ED 03/26/21. Admitted for presyncope, dehydration and generalized weakness.  Rheumatological work-up initiated.  Rheumatoid factor markedly elevated.  Initiated prednisone. PMH: HTN, hyperlipidemia, depression   OT comments  Patient is a pleasant motivated female who was noted to have increased fatigue with activity. Patient was min A for sit to stand with min guard for functional mobility to commode in bathroom. Patient was mod A for LB dressing tasks with education on compensatory strategies. Patient is not at safe level to transition back home alone at this time. Patient would benefit from inpatient rehab therapy to increase functional activity tolerance, endurance and standing balance to increase patients ability to engage in ADL tasks.    Recommendations for follow up therapy are one component of a multi-disciplinary discharge planning process, led by the attending physician.  Recommendations may be updated based on patient status, additional functional criteria and insurance authorization.    Follow Up Recommendations  Acute inpatient rehab (3hours/day)    Assistance Recommended at Discharge    Equipment Recommendations  [facility]    Recommendations for Other Services Rehab consult    Precautions / Restrictions Precautions Precautions: Fall Precaution Comments: fatigues quickly Restrictions Weight Bearing Restrictions: No       Mobility Bed Mobility Overal bed mobility: Needs Assistance Bed Mobility: Supine to Sit     Supine to sit: Mod assist          Transfers Overall transfer level: Needs  assistance Equipment used: Rolling walker (2 wheels) Transfers: Sit to/from Stand Sit to Stand: Min guard           General transfer comment: min A to power to stand from EOB and toilet, slow to rise     Balance   Sitting-balance support: No upper extremity supported;Feet supported Sitting balance-Leahy Scale: Good     Standing balance support: During functional activity;Bilateral upper extremity supported;No upper extremity supported Standing balance-Leahy Scale: Poor Standing balance comment: reliant on UE support during standing                           ADL either performed or assessed with clinical judgement   ADL Overall ADL's : Needs assistance/impaired     Grooming: Min guard;Wash/dry hands;Standing   Upper Body Bathing: Set up;Standing Upper Body Bathing Details (indicate cue type and reason): patient was quick to fatigue with activity tolerance of about 2.5 mins standing to complete bathing tasks with RW and education for safety and ECT. Lower Body Bathing: Sit to/from stand;Moderate assistance   Upper Body Dressing : Set up;Sitting   Lower Body Dressing: Moderate assistance;Sit to/from stand Lower Body Dressing Details (indicate cue type and reason): patient needed increased assistance to clear undergarments over bilateral feet with education to start with "bad" leg first. Toilet Transfer: Minimal assistance;Ambulation;Rolling walker (2 wheels) Toilet Transfer Details (indicate cue type and reason): patient needed physical assistance for sit to stand from edge of bed to make it to commode with min guard with RW once in standing to complete functional mobility to bathroom Toileting- Clothing Manipulation and Hygiene: Min guard;Sit to/from stand  General ADL Comments: decreased activity tolerance and in need of enery conservation     Vision Baseline Vision/History: 1 Wears glasses Patient Visual Report: No change from baseline      Perception     Praxis      Cognition Arousal/Alertness: Awake/alert Behavior During Therapy: Flat affect Overall Cognitive Status: No family/caregiver present to determine baseline cognitive functioning Area of Impairment: Problem solving                             Problem Solving: Decreased initiation;Requires verbal cues General Comments: patient follows cues with increased time with patient noted to be flat during interactions with therapist and sister who was present at start of session          Exercises     Shoulder Instructions       General Comments      Pertinent Vitals/ Pain       Pain Assessment: No/denies pain  Home Living                                          Prior Functioning/Environment              Frequency  Min 2X/week        Progress Toward Goals  OT Goals(current goals can now be found in the care plan section)  Progress towards OT goals: Progressing toward goals  Acute Rehab OT Goals OT Goal Formulation: With patient Time For Goal Achievement: 04/14/21 Potential to Achieve Goals: Good  Plan Discharge plan needs to be updated    Co-evaluation                 AM-PAC OT "6 Clicks" Daily Activity     Outcome Measure   Help from another person eating meals?: None Help from another person taking care of personal grooming?: A Little Help from another person toileting, which includes using toliet, bedpan, or urinal?: A Little Help from another person bathing (including washing, rinsing, drying)?: A Little Help from another person to put on and taking off regular upper body clothing?: A Little Help from another person to put on and taking off regular lower body clothing?: A Lot 6 Click Score: 18    End of Session Equipment Utilized During Treatment: Gait belt;Rolling walker (2 wheels)  OT Visit Diagnosis: Unsteadiness on feet (R26.81);Other abnormalities of gait and mobility (R26.89);History  of falling (Z91.81);Muscle weakness (generalized) (M62.81)   Activity Tolerance Patient tolerated treatment well;Patient limited by fatigue   Patient Left in chair;with call bell/phone within reach;with chair alarm set   Nurse Communication Mobility status        Time: 5462-7035 OT Time Calculation (min): 27 min  Charges: OT General Charges $OT Visit: 1 Visit OT Treatments $Self Care/Home Management : 23-37 mins  Sharyn Blitz OTR/L, MS Acute Rehabilitation Department Office# [phone] Pager# [phone]   Chalmers Guest Caiden Arteaga 04/02/2021, 10:38 AM

## 2021-04-02 NOTE — Progress Notes (Signed)
° °  Inpatient Rehab Admissions Coordinator : ° °Per therapy change in recommendations, patient was screened for CIR candidacy by Mister Krahenbuhl RN MSN.  At this time patient appears to be a potential candidate for CIR. I will place a rehab consult per protocol for full assessment. Please call me with any questions. ° °Katarina Riebe RN MSN °Admissions Coordinator °[phone] °  °

## 2021-04-03 ENCOUNTER — Encounter: Payer: Federal, State, Local not specified - PPO | Admitting: Family Medicine

## 2021-04-03 DIAGNOSIS — R531 Weakness: Secondary | ICD-10-CM | POA: Diagnosis not present

## 2021-04-03 DIAGNOSIS — R55 Syncope and collapse: Secondary | ICD-10-CM | POA: Diagnosis not present

## 2021-04-03 DIAGNOSIS — F321 Major depressive disorder, single episode, moderate: Secondary | ICD-10-CM | POA: Diagnosis not present

## 2021-04-03 DIAGNOSIS — E785 Hyperlipidemia, unspecified: Secondary | ICD-10-CM | POA: Diagnosis not present

## 2021-04-03 NOTE — Progress Notes (Signed)
Mobility Specialist - Progress Note    04/03/21 1635  Mobility  Activity Ambulated in hall  Level of Assistance Standby assist, set-up cues, supervision of patient - no hands on  Assistive Device Front wheel walker  Distance Ambulated (ft) 40 ft  Mobility Ambulated with assistance in room  Mobility Response Tolerated well  Mobility performed by Mobility specialist  $Mobility charge 1 Mobility   Upon entry pt was agreeable to ambulate in room and requested to use bathroom before beginning session. Pt required no assistance to sit EOB and used standby assist to ambulate to bathroom. Once finished, pt used RW to walk ~40 ft in room and sat in recliner once done. Pt completed 10 ankle pumps and 10 leg lifts prior to ending session. Pt left in recliner with call bell at side and RN informed of session.   Arliss Journey Mobility Specialist Acute Rehabilitation Services Phone: [phone] 04/03/21, 4:37 PM

## 2021-04-03 NOTE — Progress Notes (Signed)
PROGRESS NOTE    Carrie [name]  IWL:[id number] DOB: February 16, 1961 DOA: 03/26/2021 PCP: Ann Held, DO   Brief Narrative: Carrie [name] is a 60 y.o. female with a history of hypertension, hyperlipidemia, depression. Patient presented after a fall in setting of progressive weakness and pain. Workup significant for likely rheumatoid arthritis with improvement of symptoms on prednisone.   Assessment & Plan:   Principal Problem:   Pre-syncope Active Problems:   Hyperlipidemia LDL goal <100   Depression, major, single episode, moderate (HCC)   Generalized weakness   Joint pain   Non-traumatic rhabdomyolysis   Generalized pain Rheumatoid arthritis Workup inpatient was significant for elevated RA (101.4), positive ANA, negative CCP, mildly elevated ESR (26) and CRP (2.5). Outpatient rheumatology, Dr. Kathlene November, curbside consulted and recommended treatment for rheumatoid arthritis with prednisone and taper. -Prednisone (10/21) x1 week, then taper to zero over days around 4-5 days prior to office visit  Presyncope Unsure of etiology. No arrhythmias noted. Transthoracic Echocardiogram with normal EF.   Elevated CK Improved.  Elevated AST/ALT Possibly related to elevated CK.  Hypokalemia Repleted.  Skin rash Improved.  Primary hypertension Patient is on amlodipine and lisinopril as an outpatient. -Continue amlodipine and lisinopril  Hyperlipidemia On Lipitor as an outpatient which was held secondary to AST/ALT.   Depression -Continue Wellbutrin and Lexapro  Confusion Possibly cognitive impairment. Recommendation for outpatient neurology/neuropsychiatry consult. Normal TSH. Elevated B12.   DVT prophylaxis: Lovenox Code Status:   Code Status: Full Code Family Communication: None at bedside Disposition Plan: Discharge to CIR pending bed availability. Medically stable for discharge.   Consultants:  PM&R  Procedures:  TRANSTHORACIC ECHOCARDIOGRAM  (03/27/21) IMPRESSIONS     1. Left ventricular ejection fraction, by estimation, is 60 to 65%. The  left ventricle has normal function. The left ventricle has no regional  wall motion abnormalities. Left ventricular diastolic parameters are  consistent with Grade I diastolic  dysfunction (impaired relaxation).   2. Right ventricular systolic function is normal. The right ventricular  size is normal. Tricuspid regurgitation signal is inadequate for assessing  PA pressure.   3. The mitral valve is normal in structure. No evidence of mitral valve  regurgitation. No evidence of mitral stenosis.   4. The aortic valve is tricuspid. Aortic valve regurgitation is not  visualized. No aortic stenosis is present.   5. The inferior vena cava is normal in size with greater than 50%  respiratory variability, suggesting right atrial pressure of 3 mmHg.  Antimicrobials: None    Subjective: No issues overnight. Pain is resolved.  Objective: Vitals:   04/02/21 1419 04/02/21 1936 04/03/21 0500 04/03/21 0508  BP: 139/76 120/79  117/85  Pulse: 86 100  (!) 109  Resp: $Remo'16 15  20  'sProD$ Temp: 98.2 F (36.8 C) 97.8 F (36.6 C)  97.8 F (36.6 C)  TempSrc: Oral Oral  Oral  SpO2:  91%  98%  Weight:   78 kg   Height:       No intake or output data in the 24 hours ending 04/03/21 1458  Filed Weights   03/31/21 0500 04/02/21 0522 04/03/21 0500  Weight: 77.7 kg 77.6 kg 78 kg    Examination:  General exam: Appears calm and comfortable Respiratory system: Clear to auscultation. Respiratory effort normal. Cardiovascular system: S1 & S2 heard, RRR. No murmurs, rubs, gallops or clicks. Gastrointestinal system: Abdomen is nondistended, soft and nontender. No organomegaly or masses felt. Normal bowel sounds heard. Central nervous system: Alert and  oriented. No focal neurological deficits. Musculoskeletal: No edema. No calf tenderness Skin: No cyanosis. Psychiatry: Judgement and insight appear normal. Mood  & affect appropriate.     Data Reviewed: I have personally reviewed following labs and imaging studies  CBC Lab Results  Component Value Date   WBC 6.4 03/26/2021   RBC 4.65 03/26/2021   HGB 14.5 03/26/2021   HCT 42.6 03/26/2021   MCV 91.6 03/26/2021   MCH 31.2 03/26/2021   PLT 253 03/26/2021   MCHC 34.0 03/26/2021   RDW 14.9 03/26/2021   LYMPHSABS 0.4 (L) 03/26/2021   MONOABS 0.3 03/26/2021   EOSABS 0.0 03/26/2021   BASOSABS 0.0 11/57/2620     Last metabolic panel Lab Results  Component Value Date   NA 134 (L) 03/31/2021   K 3.8 03/31/2021   CL 106 03/31/2021   CO2 22 03/31/2021   BUN 13 03/31/2021   CREATININE 0.87 04/02/2021   GLUCOSE 96 03/31/2021   GFRNONAA >60 04/02/2021   GFRAA  08/24/2008    >60        The eGFR has been calculated using the MDRD equation. This calculation has not been validated in all clinical situations. eGFR's persistently <60 mL/min signify possible Chronic Kidney Disease.   CALCIUM 8.8 (L) 03/31/2021   PROT 6.6 04/02/2021   ALBUMIN 2.8 (L) 04/02/2021   BILITOT 0.7 04/02/2021   ALKPHOS 62 04/02/2021   AST 75 (H) 04/02/2021   ALT 85 (H) 04/02/2021   ANIONGAP 6 03/31/2021    CBG (last 3)  No results for input(s): GLUCAP in the last 72 hours.   GFR: Estimated Creatinine Clearance: 67.7 mL/min (by C-G formula based on SCr of 0.87 mg/dL).  Coagulation Profile: No results for input(s): INR, PROTIME in the last 168 hours.  Recent Results (from the past 240 hour(s))  Resp Panel by RT-PCR (Flu A&B, Covid) Nasopharyngeal Swab     Status: None   Collection Time: 03/26/21 10:14 PM   Specimen: Nasopharyngeal Swab; Nasopharyngeal(NP) swabs in vial transport medium  Result Value Ref Range Status   SARS Coronavirus 2 by RT PCR NEGATIVE NEGATIVE Final    Comment: (NOTE) SARS-CoV-2 target nucleic acids are NOT DETECTED.  The SARS-CoV-2 RNA is generally detectable in upper respiratory specimens during the acute phase of infection.  The lowest concentration of SARS-CoV-2 viral copies this assay can detect is 138 copies/mL. A negative result does not preclude SARS-Cov-2 infection and should not be used as the sole basis for treatment or other patient management decisions. A negative result may occur with  improper specimen collection/handling, submission of specimen other than nasopharyngeal swab, presence of viral mutation(s) within the areas targeted by this assay, and inadequate number of viral copies(<138 copies/mL). A negative result must be combined with clinical observations, patient history, and epidemiological information. The expected result is Negative.  Fact Sheet for Patients:  EntrepreneurPulse.com.au  Fact Sheet for Healthcare Providers:  IncredibleEmployment.be  This test is no t yet approved or cleared by the [place] FDA and  has been authorized for detection and/or diagnosis of SARS-CoV-2 by FDA under an Emergency Use Authorization (EUA). This EUA will remain  in effect (meaning this test can be used) for the duration of the COVID-19 declaration under Section 564(b)(1) of the Act, 21 U.S.C.section 360bbb-3(b)(1), unless the authorization is terminated  or revoked sooner.       Influenza A by PCR NEGATIVE NEGATIVE Final   Influenza B by PCR NEGATIVE NEGATIVE Final    Comment: (NOTE) The  Xpert Xpress SARS-CoV-2/FLU/RSV plus assay is intended as an aid in the diagnosis of influenza from Nasopharyngeal swab specimens and should not be used as a sole basis for treatment. Nasal washings and aspirates are unacceptable for Xpert Xpress SARS-CoV-2/FLU/RSV testing.  Fact Sheet for Patients: EntrepreneurPulse.com.au  Fact Sheet for Healthcare Providers: IncredibleEmployment.be  This test is not yet approved or cleared by the [place] FDA and has been authorized for detection and/or diagnosis of SARS-CoV-2 by FDA  under an Emergency Use Authorization (EUA). This EUA will remain in effect (meaning this test can be used) for the duration of the COVID-19 declaration under Section 564(b)(1) of the Act, 21 U.S.C. section 360bbb-3(b)(1), unless the authorization is terminated or revoked.  Performed at [facility], [place] [address] [place], [place] [address]         Radiology Studies: No results found.      Scheduled Meds:  amLODipine  5 mg Oral Daily   buPROPion  300 mg Oral Daily   enoxaparin (LOVENOX) injection  40 mg Subcutaneous Q24H   escitalopram  20 mg Oral Daily   lisinopril  30 mg Oral Daily   pantoprazole  40 mg Oral Daily   predniSONE  20 mg Oral Q breakfast   sodium chloride flush  3 mL Intravenous Q12H   Continuous Infusions:   LOS: 7 days     Cordelia Poche, MD [facility] 04/03/2021, 2:58 PM  If 7PM-7AM, please contact night-coverage [email or web address]

## 2021-04-03 NOTE — PMR Pre-admission (Signed)
PMR Admission Coordinator Pre-Admission Assessment  Patient: Carrie [name] is an 60 y.o., female MRN: [id number] DOB: 04/19/1961 Height: _0  (162.6 cm) Weight: 78 kg  Insurance Information HMO:     PPO: yes     PCP:      IPA:      80/20:      OTHER:  PRIMARY: [place] [employer] [place]      Policy#: [id number]      Subscriber: Pt CM Name:       Phone#:      Fax#: [phone] Pre-Cert#: [id number]      Employer: I received a call from [facility] 04/04/21 granting [place] for 7 days with updates due 04/10/21 Benefits:  Phone #: [phone]     Name: Loleta Chance Date: 09/11/2000 - still active Deductible: $350 (met $350) OOP Max: $6,000 ($715 met) CIR: $350/admission copay      SNF: $175/admission copay with 30 days/yr Outpatient: limit 75 visits combined     Co-Pay: $25 copay per visit Home Health: 85% with 50 visits/yr    Co-Pay: 15% DME: 85%     Co-Pay: 15% Providers: in network     SECONDARY:       Policy#:      Phone#:   Development worker, community:       Phone#:   The Engineer, petroleum" for patients in Inpatient Rehabilitation Facilities with attached "Privacy Act [place] Records" was provided and verbally reviewed with: Patient  Emergency Contact Information Contact Information     Name Relation Home Work Mobile   [place] Sister   [phone]       Current Medical History  Patient Admitting Diagnosis: Debility, Generalized Weakness, pre-syncope  History of Present Illness: A 60 year old right-handed female with history of hypertension, hyperlipidemia and depression, quit smoking 7 years ago.  Per chart review patient lives alone.  Two-level home 10 steps to entry.  Bed and bath on second level.  Independent prior to admission.  [facility] 03[facility] with recent fall 3 weeks ago after missing a step without loss of consciousness.  She also reported some generalized weakness and lightheadedness and dizziness on standing.  By  report she had recently gone to the [place] urgent care about a week ago received Flexeril and ibuprofen.  Admission chemistries unremarkable except creatinine 1.03, AST 161, ALT 114, troponin negative, CK 638, lactic acid 1.8, TSH within normal limits, vitamin B12 7158, urinalysis negative nitrite, urine drug screen negative.  Cranial CT scan negative.  CT cervical spine unremarkable.  X-rays of hip and pelvis no acute osseous injury.  Echocardiogram with ejection fraction of 60 to 65% no wall motion abnormalities grade 1 diastolic dysfunction.  Ultrasound of the abdomen gallbladder distended by sludge no gross hepatic mass.  Work-up for possible rheumatoid arthritis positive ANA negative CCP mildly elevated ESR 26 and CRP 2.5 as well as elevated RA(101.4).  Outpatient rheumatology Dr.Aryal curbside consulted recommend treatment with prednisone and taper.  CK improved to 226 as well as improved LFTs..  Subcutaneous Lovenox for DVT prophylaxis.  Tolerating a regular diet.  Therapy evaluations completed due to patient decreased functional mobility.  Patient to be admitted for a comprehensive inpatient rehab program.   Patient's medical record from [facility] has been reviewed by the rehabilitation admission coordinator and physician.  Past Medical History  Past Medical History:  Diagnosis Date   Hyperlipidemia    Hypertension     Has the patient had major  surgery during 100 days prior to admission? No  Family History   family history includes Breast cancer in her mother and another family member; Breast cancer (age of onset: 48) in her sister; Coronary artery disease in an other family member; Diabetes in her sister; Hypertension in an other family member.  Current Medications  Current Facility-Administered Medications:    acetaminophen (TYLENOL) tablet 650 mg, 650 mg, Oral, Q6H PRN, 650 mg at 03/29/21 2015 **OR** acetaminophen (TYLENOL) suppository 650 mg, 650 mg, Rectal,  Q6H PRN, Marcelyn Bruins, MD   amLODipine (NORVASC) tablet 5 mg, 5 mg, Oral, Daily, Marcelyn Bruins, MD, 5 mg at 04/03/21 0856   buPROPion (WELLBUTRIN XL) 24 hr tablet 300 mg, 300 mg, Oral, Daily, Marcelyn Bruins, MD, 300 mg at 04/03/21 0855   enoxaparin (LOVENOX) injection 40 mg, 40 mg, Subcutaneous, Q24H, Marcelyn Bruins, MD, 40 mg at 04/02/21 2204   escitalopram (LEXAPRO) tablet 20 mg, 20 mg, Oral, Daily, Marcelyn Bruins, MD, 20 mg at 04/03/21 0855   lisinopril (ZESTRIL) tablet 30 mg, 30 mg, Oral, Daily, Marcelyn Bruins, MD, 30 mg at 04/03/21 0855   pantoprazole (PROTONIX) EC tablet 40 mg, 40 mg, Oral, Daily, Marcelyn Bruins, MD, 40 mg at 04/03/21 0855   phenol (CHLORASEPTIC) mouth spray 1 spray, 1 spray, Mouth/Throat, PRN, Mariel Aloe, MD   predniSONE (DELTASONE) tablet 20 mg, 20 mg, Oral, Q breakfast, Hongalgi, Anand D, MD, 20 mg at 04/03/21 0856   sodium chloride flush (NS) 0.9 % injection 3 mL, 3 mL, Intravenous, Q12H, Marcelyn Bruins, MD, 3 mL at 04/03/21 0856   traMADol (ULTRAM) tablet 50 mg, 50 mg, Oral, Q12H PRN, Modena Jansky, MD, 50 mg at 04/02/21 1431  Patients Current Diet:  Diet Order             Diet regular Room service appropriate? Yes; Fluid consistency: Thin  Diet effective now                   Precautions / Restrictions Precautions Precautions: Fall Precaution Comments: fatigues quickly Restrictions Weight Bearing Restrictions: No   Has the patient had 2 or more falls or a fall with injury in the past year? Yes  Prior Activity Level Community (5-7x/wk): Pt was active in the community PTA  Prior Functional Level Self Care: Did the patient need help bathing, dressing, using the toilet or eating? Independent  Indoor Mobility: Did the patient need assistance with walking from room to room (with or without device)? Independent  Stairs: Did the patient need assistance with internal or external stairs (with or without  device)? Independent  Functional Cognition: Did the patient need help planning regular tasks such as shopping or remembering to take medications? Independent  Patient Information Are you of Hispanic, Latino/a,or Spanish origin?: A. No, not of Hispanic, Latino/a, or Spanish origin What is your race?: B. Black or African American Do you need or want an interpreter to communicate with a doctor or health care staff?: 0. No  Patient's Response To:  Health Literacy and Transportation Is the patient able to respond to health literacy and transportation needs?: Yes Health Literacy - How often do you need to have someone help you when you read instructions, pamphlets, or other written material from your doctor or pharmacy?: Never In the past 12 months, has lack of transportation kept you from medical appointments or from getting medications?: No In the past 12 months, has lack of transportation kept you from  meetings, work, or from getting things needed for daily living?: No  Development worker, international aid / [place] Devices/Equipment: None Home Equipment: None  Prior Device Use: Indicate devices/aids used by the patient prior to current illness, exacerbation or injury? Walker  Current Functional Level Cognition  Overall Cognitive Status: No family/caregiver present to determine baseline cognitive functioning Orientation Level: Oriented X4 General Comments: patient follows cues with increased time with patient noted to be flat during interactions with therapist and sister who was present at start of session    Extremity Assessment (includes Sensation/Coordination)  Upper Extremity Assessment: Overall WFL for tasks assessed  Lower Extremity Assessment: Defer to PT evaluation    ADLs  Overall ADL's : Needs assistance/impaired Eating/Feeding: Modified independent, Sitting Grooming: Min guard, Wash/dry hands, Standing Grooming Details (indicate cue type and reason): sink level,  decreased activity tolerance - reports sitting frequently Upper Body Bathing: Set up, Standing Upper Body Bathing Details (indicate cue type and reason): patient was quick to fatigue with activity tolerance of about 2.5 mins standing to complete bathing tasks with RW and education for safety and ECT. Lower Body Bathing: Sit to/from stand, Moderate assistance Lower Body Bathing Details (indicate cue type and reason): able to reach down to toes without problem Upper Body Dressing : Set up, Sitting Upper Body Dressing Details (indicate cue type and reason): to don gown backwards like cape Lower Body Dressing: Moderate assistance, Sit to/from stand Lower Body Dressing Details (indicate cue type and reason): patient needed increased assistance to clear undergarments over bilateral feet with education to start with "bad" leg first. Toilet Transfer: Minimal assistance, Ambulation, Rolling walker (2 wheels) Toilet Transfer Details (indicate cue type and reason): patient needed physical assistance for sit to stand from edge of bed to make it to commode with min guard with RW once in standing to complete functional mobility to bathroom Toileting- Clothing Manipulation and Hygiene: Min guard, Sit to/from stand Tub/ Banker: Walk-in shower, Min guard, Ambulation, Rolling walker Functional mobility during ADLs: Minimal assistance, Min guard, Rolling walker General ADL Comments: decreased activity tolerance and in need of enery conservation    Mobility  Overal bed mobility: Needs Assistance Bed Mobility: Supine to Sit Supine to sit: Mod assist Sit to supine: Mod assist General bed mobility comments: mod A to upright trunk and slide BLE over towards EOB, requires increased time to inch BLE    Transfers  Overall transfer level: Needs assistance Equipment used: Rolling walker (2 wheels) Transfers: Sit to/from Stand Sit to Stand: Min guard General transfer comment: min A to power to stand from EOB  and toilet, slow to rise    Ambulation / Gait / Stairs / Wheelchair Mobility  Ambulation/Gait Ambulation/Gait assistance: Counsellor (Feet): 50 Feet (+additional 15 ft in room) Assistive device: Rolling walker (2 wheels) Gait Pattern/deviations: Step-through pattern, Decreased stride length, Trunk flexed General Gait Details: slow step to pattern with trunk slightly flexed over RW, no overt LOB but slightly unsteady, limited by fatigue Gait velocity: decreased    Posture / Balance Balance Overall balance assessment: History of Falls, Needs assistance Sitting-balance support: No upper extremity supported, Feet supported Sitting balance-Leahy Scale: Good Standing balance support: During functional activity, Bilateral upper extremity supported, No upper extremity supported Standing balance-Leahy Scale: Poor Standing balance comment: reliant on UE support during standing    Special needs/care consideration Skin rash on mid to upper chest    Previous Home Environment (from acute therapy documentation) Living Arrangements: Alone Available Help  at Discharge: Family, Available PRN/intermittently Type of Home: House Home Layout: Two level Alternate Level Stairs-Number of Steps: 10 Home Access: Stairs to enter [employer] of Steps: 2 Bathroom Shower/Tub: Multimedia programmer: Standard Bathroom Accessibility: Yes How Accessible: Accessible via walker [place]: No Additional Comments: bed and bath are both on 2nd floor. first floor has half bath and kitchen  Discharge Living Setting Plans for Discharge Living Setting: Alone Type of Home at Discharge: House Discharge Home Layout: Two level, Able to live on main level with bedroom/bathroom Alternate Level Stairs-Rails: Right Alternate Level Stairs-Number of Steps: 10 Discharge Home Access: Stairs to enter Entrance Stairs-Rails: None Entrance Stairs-Number of Steps: 2 Discharge Bathroom  Shower/Tub: Walk-in shower Discharge Bathroom Toilet: Standard Discharge Bathroom Accessibility: Yes How Accessible: Accessible via walker Does the patient have any problems obtaining your medications?: No  Social/Family/Support Systems Patient Roles: Other (Comment) Contact Information: [phone] Anticipated Caregiver: Basilio Cairo (sister) Anticipated Caregiver's Contact Information: [phone] Ability/Limitations of Caregiver: Can do min A per pt. Caregiver Availability: Intermittent Does Caregiver/Family have Issues with Lodging/Transportation while Pt is in Rehab?: No  Goals Patient/Family Goal for Rehab: PT/OT Mod I Expected length of stay: 5-7 days Pt/Family Agrees to Admission and willing to participate: Yes Program Orientation Provided & Reviewed with Pt/Caregiver Including Roles  & Responsibilities: Yes  Decrease burden of Care through IP rehab admission: Specialzed equipment needs, Decrease number of caregivers, Bowel and bladder program, and Patient/family education  Possible need for SNF placement upon discharge: not anticipated  Patient Condition: I have reviewed medical records from [facility] , spoken with CM, and patient. I discussed via phone for inpatient rehabilitation assessment.  Patient will benefit from ongoing PT and OT, can actively participate in 3 hours of therapy a day 5 days of the week, and can make measurable gains during the admission.  Patient will also benefit from the coordinated team approach during an Inpatient Acute Rehabilitation admission.  The patient will receive intensive therapy as well as Rehabilitation physician, nursing, social worker, and care management interventions.  Due to safety, disease management, medication administration, pain management, and patient education the patient requires 24 hour a day rehabilitation nursing.  The patient is currently min a-min guard with mobility and basic ADLs.  Discharge setting  and therapy post discharge at home with home health is anticipated.  Patient has agreed to participate in the Acute Inpatient Rehabilitation Program and will admit today.  Preadmission Screen Completed By:  Retta Diones, 04/03/2021 10:58 AM with updates by Clemens Catholic, MS, CCC-SLP  ______________________________________________________________________   Discussed status with Dr. Dagoberto Ligas  on 04/08/21 at 48 and received approval for admission today.  Admission Coordinator:  Retta Diones, RN, time 1030/Date 04/08/21   Assessment/Plan: Diagnosis: Does the need for close, 24 hr/day Medical supervision in concert with the patient's rehab needs make it unreasonable for this patient to be served in a less intensive setting? Yes Co-Morbidities requiring supervision/potential complications: HTN, transaminitis, new'y dx'd RA with pain on prednisone; debility Due to bowel management, safety, skin/wound care, disease management, medication administration, pain management, and patient education, does the patient require 24 hr/day rehab nursing? Yes Does the patient require coordinated care of a physician, rehab nurse, PT, OT, and SLP to address physical and functional deficits in the context of the above medical diagnosis(es)? Yes Addressing deficits in the following areas: balance, endurance, locomotion, strength, transferring, bathing, dressing, feeding, grooming, and toileting Can the patient actively participate in an intensive  therapy program of at least 3 hrs of therapy 5 days a week? Yes The potential for patient to make measurable gains while on inpatient rehab is good Anticipated functional outcomes upon discharge from inpatient rehab: modified independent PT, modified independent OT, n/a SLP Estimated rehab length of stay to reach the above functional goals is: 5-7 days Anticipated discharge destination: Home 10. Overall Rehab/Functional Prognosis: good   MD Signature:

## 2021-04-03 NOTE — H&P (Signed)
Physical Medicine and Rehabilitation Admission H&P    Chief Complaint  Patient presents with   Fall     HPI: Carrie [name] is a 60 year old right-handed female with history of hypertension, hyperlipidemia and depression, quit smoking 7 years ago.  Per chart review patient lives alone.  Two-level home 10 steps to entry.  Bed and bath on second level.  Independent prior to admission.  Presented to [facility] hospital 03/26/2021 with recent fall 3 weeks ago after missing a step without loss of consciousness.  She also reported some generalized weakness and lightheadedness and dizziness on standing.  By report she had recently gone to the [facility] urgent care about a week ago received Flexeril and ibuprofen.  Admission chemistries unremarkable except creatinine 1.03, AST 161, ALT 114, troponin negative, CK 638, lactic acid 1.8, TSH within normal limits, vitamin B12 7158, urinalysis negative nitrite, urine drug screen negative.  Cranial CT scan negative.  CT cervical spine unremarkable.  X-rays of hip and pelvis no acute osseous injury.  Echocardiogram with ejection fraction of 60 to 65% no wall motion abnormalities grade 1 diastolic dysfunction.  Ultrasound of the abdomen gallbladder distended by sludge no gross hepatic mass.  Work-up for possible rheumatoid arthritis positive ANA negative CCP mildly elevated ESR 26 and CRP 2.5 as well as elevated RA(101.4).  Outpatient rheumatology Dr.Aryal curbside consulted recommend treatment with prednisone and taper.  CK improved to 226 as well as improved LFTs..  Subcutaneous Lovenox for DVT prophylaxis.  Tolerating a regular diet.  Therapy evaluations completed due to patient decreased functional mobility was admitted for a comprehensive rehab program.  Pt reports pain well controlled- no energy.  Sleeping OK at night. LBM yesterday. Peeing OK.    Review of Systems  Constitutional:  Positive for malaise/fatigue. Negative for chills and fever.   HENT:  Negative for hearing loss.   Eyes:  Negative for blurred vision and double vision.  Respiratory:  Negative for cough and shortness of breath.   Cardiovascular:  Negative for chest pain, palpitations and leg swelling.  Gastrointestinal:  Positive for constipation. Negative for heartburn, nausea and vomiting.  Genitourinary:  Negative for dysuria, flank pain and hematuria.       Reports of dark cloudy urine  Musculoskeletal:  Positive for back pain, joint pain and myalgias.  Skin:  Negative for rash.  Neurological:  Positive for dizziness.       Lightheadedness sit to stand  Psychiatric/Behavioral:  Positive for depression.   All other systems reviewed and are negative. Past Medical History:  Diagnosis Date   Hyperlipidemia    Hypertension    Past Surgical History:  Procedure Laterality Date   ABDOMINAL HYSTERECTOMY     REDUCTION MAMMAPLASTY     Family History  Problem Relation Age of Onset   Breast cancer Mother        deceased by 38   Breast cancer Sister 60   Diabetes Sister    Breast cancer Other    Coronary artery disease Other    Hypertension Other    Social History:  reports that she quit smoking about 7 years ago. Her smoking use included cigarettes. She has never used smokeless tobacco. She reports current alcohol use. She reports that she does not use drugs. Allergies:  Allergies  Allergen Reactions   Zoloft [Sertraline Hcl] Diarrhea and Other (See Comments)    Stomach upset    Medications Prior to Admission  Medication Sig Dispense Refill   amLODipine (NORVASC)  5 MG tablet TAKE 1 TABLET ONCE DAILY. (Patient taking differently: Take 5 mg by mouth daily.) 90 tablet 0   atorvastatin (LIPITOR) 40 MG tablet TAKE ONE TABLET AT BEDTIME. (Patient taking differently: Take 40 mg by mouth daily.) 90 tablet 0   buPROPion (WELLBUTRIN XL) 300 MG 24 hr tablet TAKE 1 TABLET EACH DAY. (Patient taking differently: Take 300 mg by mouth daily.) 90 tablet 1    cyclobenzaprine (FLEXERIL) 10 MG tablet Take 10 mg by mouth daily as needed for muscle spasms.     escitalopram (LEXAPRO) 20 MG tablet Take 1 tablet (20 mg total) by mouth daily. 30 tablet 2   fluticasone (FLONASE) 50 MCG/ACT nasal spray Place 2 sprays into both nostrils daily. (Patient taking differently: Place 2 sprays into both nostrils daily as needed for allergies.) 16 g 6   IBU 800 MG tablet Take 800 mg by mouth every 8 (eight) hours as needed for moderate pain.     lisinopril (ZESTRIL) 30 MG tablet TAKE 1 TABLET ONCE DAILY. (Patient taking differently: Take 30 mg by mouth daily.) 90 tablet 0   loratadine (CLARITIN) 10 MG tablet Take 1 tablet (10 mg total) by mouth daily. 30 tablet 11   pantoprazole (PROTONIX) 40 MG tablet Take 1 tablet (40 mg total) by mouth daily. 30 tablet 3   ibuprofen (IBU) 600 MG tablet Take 1 tablet (600 mg total) by mouth 3 (three) times daily as needed. (Patient not taking: No sig reported) 30 tablet 0   predniSONE (DELTASONE) 10 MG tablet TAKE 3 TABLETS PO QD FOR 3 DAYS THEN TAKE 2 TABLETS PO QD FOR 3 DAYS THEN TAKE 1 TABLET PO QD FOR 3 DAYS THEN TAKE 1/2 TAB PO QD FOR 3 DAYS (Patient not taking: No sig reported) 20 tablet 0   promethazine-dextromethorphan (PROMETHAZINE-DM) 6.25-15 MG/5ML syrup Take 5 mLs by mouth 4 (four) times daily as needed. (Patient not taking: No sig reported) 118 mL 0    Drug Regimen Review Drug regimen was reviewed and remains appropriate with no significant issues identified  Home: Home Living Family/patient expects to be discharged to:: Private residence Living Arrangements: Alone Available Help at Discharge: Family, Available PRN/intermittently Type of Home: House Home Access: Stairs to enter [employer] of Steps: 2 Home Layout: Two level Alternate Level Stairs-Number of Steps: 10 Bathroom Shower/Tub: Multimedia programmer: Standard Bathroom Accessibility: Yes Home Equipment: None Additional Comments: bed  and bath are both on 2nd floor. 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 Much encouragement to self perform Transfers Overall transfer level: Needs assistance Equipment used: Rolling walker (2 wheels) Transfers: Sit to/from Stand Sit to Stand: Min assist General transfer comment: light assist to rise and control descent Ambulation/Gait Ambulation/Gait assistance: Min guard, Min assist Gait Distance (Feet): 80 Feet Assistive device: Rolling walker (2 wheels) Gait Pattern/deviations: Step-through pattern, Decreased stride length, Trunk flexed General Gait Details: cues for turning, patient had decreased problem solve to turn with RW. Gait velocity: decreased    ADL: ADL  Overall ADL's : Needs assistance/impaired Eating/Feeding: Modified independent, Sitting Grooming: Min guard, Wash/dry hands, Standing Grooming Details (indicate cue type and reason): sink level, decreased activity tolerance - reports sitting frequently Upper Body Bathing: Set up, Standing Upper Body Bathing Details (indicate cue type and reason): patient was quick to fatigue with activity tolerance of about 2.5 mins standing to complete bathing tasks with RW and education for safety and ECT. Lower Body Bathing: Sit to/from stand, Moderate assistance Lower Body Bathing Details (indicate cue type and reason): able to reach down to toes without problem Upper Body Dressing : Set up, Sitting Upper Body Dressing  Details (indicate cue type and reason): to don gown backwards like cape Lower Body Dressing: Moderate assistance, Sit to/from stand Lower Body Dressing Details (indicate cue type and reason): patient needed increased assistance to clear undergarments over bilateral feet with education to start with "bad" leg first. Toilet Transfer: Minimal assistance, Ambulation, Rolling walker (2 wheels) Toilet Transfer Details (indicate cue type and reason): patient needed physical assistance for sit to stand from edge of bed to make it to commode with min guard with RW once in standing to complete functional mobility to bathroom Toileting- Clothing Manipulation and Hygiene: Min guard, Sit to/from stand Tub/ Banker: Walk-in shower, Min guard, Ambulation, Rolling walker Functional mobility during ADLs: Minimal assistance, Min guard, Rolling walker General ADL Comments: patient was educated on ECT and ways to apply them to daily activities. patient verbalized understanding. patient was provided with encouragement to apply strategies to daily activities tomorrow to prioritize energy towards needed tasks v.s. wants. patient verbalized understanding  Cognition: Cognition Overall Cognitive Status: No family/caregiver present to determine baseline cognitive functioning Orientation Level: Oriented X4 Cognition Arousal/Alertness: Awake/alert Behavior During Therapy: Flat affect, WFL for tasks assessed/performed Overall Cognitive Status: No family/caregiver present to determine baseline cognitive functioning Area of Impairment: Problem solving Orientation Level: Time, Situation Problem Solving: Decreased initiation, Requires verbal cues General Comments: patient follows cues with increased time with patient noted to be flat during interactions with therapist and sister who was present at start of session  Physical Exam: Blood pressure 124/83, pulse 95, temperature 98 F (36.7 C), temperature source Oral,  resp. rate 20, height _0  (1.626 m), weight 74.4 kg, SpO2 97 %. Physical Exam Vitals and nursing note reviewed.  Constitutional:      Comments: Sitting up in bed; eating lunch; appropriate; NAD;  HENT:     Head: Normocephalic and atraumatic.     Right Ear: External ear normal.     Left Ear: External ear normal.     Nose: Nose normal. No congestion.     Mouth/Throat:     Mouth: Mucous membranes are dry.     Pharynx: Oropharynx is clear. No oropharyngeal exudate.  Eyes:     General:        Right eye: No discharge.        Left eye: No discharge.     Extraocular Movements: Extraocular movements intact.  Cardiovascular:     Rate and Rhythm: Normal rate and regular rhythm.     Heart sounds: Normal heart sounds. No murmur heard.   No gallop.  Pulmonary:     Comments: CTA B/L- no W/R/R- good air movement  Abdominal:     Comments: Soft, NT, ND, (+)BS; normoactive  Musculoskeletal:     Cervical back: Normal range of motion. No rigidity.     Comments: 4+/5 in arms proximal and distally B/L HF 4-/5; KE 4/5 DF/PF 4+/5  B/L  No swollen joints seen on exam  Skin:    Comments: Intact to light touch on exam B/L  Neurological:     Comments: Patient is alert.  Mood is a bit flat.  No specific complaints.  Makes eye contact with examiner.  Provides name age and place.  Follows full commands.  Light touch intact x all 4 extremities.   Psychiatric:        Mood and Affect: Mood normal.        Behavior: Behavior normal.    No results found for this or any previous visit (from the past 48 hour(s)).  No results found.     Medical Problem List and Plan: 1.  Debility secondary to generalized pain/rheumatoid arthritis.  Follow-up outpatient rheumatology services Dr Kathlene November.  Prednisone taper as indicated  -patient may  shower  -ELOS/Goals: 5-7 days mod I 2.  Antithrombotics: -DVT/anticoagulation:  Pharmaceutical: Lovenox  -antiplatelet therapy: n/a 3. Pain Management: Tramadol as  needed 4. Mood: Wellbutrin 300 mg daily, Lexapro 20 mg daily  -antipsychotic agents: N/A 5. Neuropsych: This patient is capable of making decisions on her own behalf. 6. Skin/Wound Care: Routine skin checks 7. Fluids/Electrolytes/Nutrition: Routine in and outs with follow-up chemistries 8.  Hypertension.  Norvasc 5 mg daily, lisinopril 30 mg daily.  Monitor with increased mobility 9.  Elevated LFTs/transaminitis- as well as CK.  Improved.  Follow-up chemistries 10.  Hyperlipidemia.  Lipitor held due to elevated LFTs   I have personally performed a face to face diagnostic evaluation of this patient and formulated the key components of the plan.  Additionally, I have personally reviewed laboratory data, imaging studies, as well as relevant notes and concur with the physician assistant's documentation above.   The patient's status has not changed from the original H&P.  Any changes in documentation from the acute care chart have been noted above.  '  Cathlyn Parsons, PA-C 04/08/2021

## 2021-04-04 DIAGNOSIS — E785 Hyperlipidemia, unspecified: Secondary | ICD-10-CM | POA: Diagnosis not present

## 2021-04-04 DIAGNOSIS — R531 Weakness: Secondary | ICD-10-CM | POA: Diagnosis not present

## 2021-04-04 DIAGNOSIS — F321 Major depressive disorder, single episode, moderate: Secondary | ICD-10-CM | POA: Diagnosis not present

## 2021-04-04 DIAGNOSIS — R55 Syncope and collapse: Secondary | ICD-10-CM | POA: Diagnosis not present

## 2021-04-04 MED ORDER — PREDNISONE 5 MG PO TABS
15.0000 mg | ORAL_TABLET | Freq: Every day | ORAL | Status: DC
  Administered 2021-04-05 – 2021-04-07 (×3): 15 mg via ORAL
  Filled 2021-04-04 (×3): qty 1

## 2021-04-04 NOTE — Progress Notes (Signed)
Physical Therapy Treatment Patient Details Name: STEPHNIE [name] MRN: [id number] DOB: 1960-11-23 Today's Date: 04/04/2021   History of Present Illness 60 year old female  sustained a mechanical fall at home on 02/01/2021 after missing a step. Patient  has continued to feel progressively weak, generalized pains, dizzy without vertigo, no syncope or near syncope and presented to the ED 03/26/21. Admitted for presyncope, dehydration and generalized weakness.  Rheumatological work-up initiated.  Rheumatoid factor markedly elevated.  Initiated prednisone. PMH: HTN, hyperlipidemia, depression    PT Comments    Pt ambulated in hallway and reports being limited by fatigue.  Pt hopeful for d/c to CIR today.  Pt also with some decreased function in hands and requested assist to open containers for breakfast.     Recommendations for follow up therapy are one component of a multi-disciplinary discharge planning process, led by the attending physician.  Recommendations may be updated based on patient status, additional functional criteria and insurance authorization.  Follow Up Recommendations  Acute inpatient rehab (3hours/day)     Assistance Recommended at Discharge    Equipment Recommendations  None recommended by PT    Recommendations for Other Services       Precautions / Restrictions Precautions Precautions: Fall Precaution Comments: fatigues quickly     Mobility  Bed Mobility Overal bed mobility: Needs Assistance Bed Mobility: Supine to Sit     Supine to sit: Min assist     General bed mobility comments: required assist for trunk upright    Transfers Overall transfer level: Needs assistance Equipment used: Rolling walker (2 wheels) Transfers: Sit to/from Stand Sit to Stand: Min assist           General transfer comment: light assist to rise and control descent    Ambulation/Gait Ambulation/Gait assistance: Min guard Gait Distance (Feet): 80 Feet Assistive device:  Rolling walker (2 wheels) Gait Pattern/deviations: Step-through pattern;Decreased stride length;Trunk flexed Gait velocity: decreased   General Gait Details: verbal cues for use of RW, pt reports distance limited by fatigue, denies dizziness   Stairs             Wheelchair Mobility    Modified Rankin (Stroke Patients Only)       Balance           Standing balance support: During functional activity;Bilateral upper extremity supported;Reliant on assistive device for balance Standing balance-Leahy Scale: Poor                              Cognition Arousal/Alertness: Awake/alert Behavior During Therapy: Flat affect Overall Cognitive Status: No family/caregiver present to determine baseline cognitive functioning                               Problem Solving: Decreased initiation;Requires verbal cues          Exercises      General Comments        Pertinent Vitals/Pain Pain Assessment: No/denies pain    Home Living                          Prior Function            PT Goals (current goals can now be found in the care plan section) Progress towards PT goals: Progressing toward goals    Frequency    Min 2X/week      PT  Plan Current plan remains appropriate    Co-evaluation              AM-PAC PT "6 Clicks" Mobility   Outcome Measure  Help needed turning from your back to your side while in a flat bed without using bedrails?: A Little Help needed moving from lying on your back to sitting on the side of a flat bed without using bedrails?: A Little Help needed moving to and from a bed to a chair (including a wheelchair)?: A Little Help needed standing up from a chair using your arms (e.g., wheelchair or bedside chair)?: A Little Help needed to walk in hospital room?: A Little Help needed climbing 3-5 steps with a railing? : A Lot 6 Click Score: 17    End of Session   Activity Tolerance: Patient  limited by fatigue Patient left: in bed;with call bell/phone within reach (sitting EOB with breakfast)   PT Visit Diagnosis: History of falling (Z91.81);Difficulty in walking, not elsewhere classified (R26.2)     Time: 5615-3794 PT Time Calculation (min) (ACUTE ONLY): 10 min  Charges:  $Gait Training: 8-22 mins                    Paulino Door, DPT Acute Rehabilitation Services Pager: [phone] Office: [phone]    Carrie [name] 04/04/2021, 1:04 PM

## 2021-04-04 NOTE — Progress Notes (Addendum)
Inpatient Rehab Admissions Coordinator:   I do not have insurance auth or a bed available for this pt. Today. I will continue to follow for potential admit pending insurance auth and bed availability. If not approved or if pt. Is ready to d/c over the weekend,  Pt. May need alternative disposition.    Megan Salon, MS, CCC-SLP Rehab Admissions Coordinator  [phone] (celll) [phone] (office)

## 2021-04-04 NOTE — NC FL2 (Signed)
[place] MEDICAID FL2 LEVEL OF CARE SCREENING TOOL     IDENTIFICATION  Patient Name: Carrie [name] Birthdate: 1960/11/22 Sex: female Admission Date (Current Location): 03/26/2021  [facility] and [place] Number:  Producer, television/film/video and Address:  [facility],  [address] [place], [place] [address]      Provider Number: [id number]  Attending Physician Name and Address:  Narda Bonds, MD  Relative Name and Phone Number:  Chrys Racer (Sister)   [phone]    Current Level of Care: Hospital Recommended Level of Care: Skilled Nursing Facility Prior Approval Number:    Date Approved/Denied:   PASRR Number:    Discharge Plan: SNF    Current Diagnoses: Patient Active Problem List   Diagnosis Date Noted   Non-traumatic rhabdomyolysis    Pre-syncope 03/26/2021   Generalized weakness 03/26/2021   Joint pain 03/26/2021   Bronchitis 01/17/2021   Depression, major, single episode, moderate (HCC) 08/25/2018   Depression, major, single episode, severe (HCC) 04/26/2017   Bunion 04/22/2017   Pruritus 08/04/2016   Environmental and seasonal allergies 08/04/2016   BACK PAIN 04/28/2010   OTHER ACUTE REACTIONS TO STRESS 03/26/2010   HOT FLASHES 01/17/2010   FATIGUE 02/02/2008   Hyperlipidemia LDL goal <100 08/18/2007   Essential hypertension 02/25/2007   Chest pain, unspecified 02/25/2007   REDUCTION MAMMOPLASTY, HX OF 02/25/2007    Orientation RESPIRATION BLADDER Height & Weight     Self, Time, Situation, Place  Normal External catheter Weight: 72.8 kg Height:  5\' 4"  (162.6 cm)  BEHAVIORAL SYMPTOMS/MOOD NEUROLOGICAL BOWEL NUTRITION STATUS      Continent Diet (see d/c summary)  AMBULATORY STATUS COMMUNICATION OF NEEDS Skin   Extensive Assist Verbally Normal                       Personal Care Assistance Level of Assistance  Bathing, Feeding, Dressing Bathing Assistance: Maximum assistance Feeding assistance: Limited assistance Dressing Assistance:  Limited assistance     Functional Limitations Info  Sight, Hearing, Speech Sight Info: Adequate Hearing Info: Adequate Speech Info: Adequate    SPECIAL CARE FACTORS FREQUENCY  PT (By licensed PT), OT (By licensed OT)     PT Frequency: 5X/W OT Frequency: 5X/W            Contractures Contractures Info: Not present    Additional Factors Info  Code Status, Allergies Code Status Info: full Allergies Info: Zoloft           Current Medications (04/04/2021):  This is the current hospital active medication list Current Facility-Administered Medications  Medication Dose Route Frequency Provider Last Rate Last Admin   acetaminophen (TYLENOL) tablet 650 mg  650 mg Oral Q6H PRN 04/06/2021, MD   650 mg at 03/29/21 2015   Or   acetaminophen (TYLENOL) suppository 650 mg  650 mg Rectal Q6H PRN 03/31/21, MD       amLODipine (NORVASC) tablet 5 mg  5 mg Oral Daily Synetta Fail, MD   5 mg at 04/04/21 04/06/21   buPROPion (WELLBUTRIN XL) 24 hr tablet 300 mg  300 mg Oral Daily [id number], MD   300 mg at 04/04/21 0853   enoxaparin (LOVENOX) injection 40 mg  40 mg Subcutaneous Q24H 04/06/21, MD   40 mg at 04/03/21 2127   escitalopram (LEXAPRO) tablet 20 mg  20 mg Oral Daily [id number], MD   20 mg at 04/04/21 0854   lisinopril (ZESTRIL) tablet  30 mg  30 mg Oral Daily Synetta Fail, MD   30 mg at 04/04/21 0853   pantoprazole (PROTONIX) EC tablet 40 mg  40 mg Oral Daily Synetta Fail, MD   40 mg at 04/04/21 0853   phenol (CHLORASEPTIC) mouth spray 1 spray  1 spray Mouth/Throat PRN Narda Bonds, MD       [START ON 04/05/2021] predniSONE (DELTASONE) tablet 15 mg  15 mg Oral Q breakfast Narda Bonds, MD       sodium chloride flush (NS) 0.9 % injection 3 mL  3 mL Intravenous Q12H Synetta Fail, MD   3 mL at 04/04/21 0854   traMADol (ULTRAM) tablet 50 mg  50 mg Oral Q12H PRN Elease Etienne, MD   50 mg at 04/02/21 1431      Discharge Medications: Please see discharge summary for a list of discharge medications.  Relevant Imaging Results:  Relevant Lab Results:   Additional Information [id number]  Ida Rogue, [place]

## 2021-04-04 NOTE — Progress Notes (Signed)
PROGRESS NOTE    Carrie [name]  ULA:[id number] DOB: 04-Mar-1961 DOA: 03/26/2021 PCP: Ann Held, DO   Brief Narrative: Carrie [name] is a 60 y.o. female with a history of hypertension, hyperlipidemia, depression. Patient presented after a fall in setting of progressive weakness and pain. Workup significant for likely rheumatoid arthritis with improvement of symptoms on prednisone.   Assessment & Plan:   Principal Problem:   Pre-syncope Active Problems:   Hyperlipidemia LDL goal <100   Depression, major, single episode, moderate (HCC)   Generalized weakness   Joint pain   Non-traumatic rhabdomyolysis   Generalized pain Rheumatoid arthritis Workup inpatient was significant for elevated RA (101.4), positive ANA, negative CCP, mildly elevated ESR (26) and CRP (2.5). Outpatient rheumatology, Dr. Kathlene November, curbside consulted and recommended treatment for rheumatoid arthritis with prednisone and taper. -Decrease to prednisone 15 mg daily for taper on the way down to zero around 4-5 days prior to office visit  Presyncope Unsure of etiology. No arrhythmias noted. Transthoracic Echocardiogram with normal EF.   Elevated CK Improved.  Elevated AST/ALT Possibly related to elevated CK.  Hypokalemia Repleted.  Skin rash Improved.  Primary hypertension Patient is on amlodipine and lisinopril as an outpatient. -Continue amlodipine and lisinopril  Hyperlipidemia On Lipitor as an outpatient which was held secondary to AST/ALT.   Depression -Continue Wellbutrin and Lexapro  Confusion Possibly cognitive impairment. Recommendation for outpatient neurology/neuropsychiatry consult. Normal TSH. Elevated B12. Resolved.   DVT prophylaxis: Lovenox Code Status:   Code Status: Full Code Family Communication: None at bedside Disposition Plan: Discharge to CIR pending bed availability. Medically stable for discharge.   Consultants:  PM&R  Procedures:  TRANSTHORACIC  ECHOCARDIOGRAM (03/27/21) IMPRESSIONS     1. Left ventricular ejection fraction, by estimation, is 60 to 65%. The  left ventricle has normal function. The left ventricle has no regional  wall motion abnormalities. Left ventricular diastolic parameters are  consistent with Grade I diastolic  dysfunction (impaired relaxation).   2. Right ventricular systolic function is normal. The right ventricular  size is normal. Tricuspid regurgitation signal is inadequate for assessing  PA pressure.   3. The mitral valve is normal in structure. No evidence of mitral valve  regurgitation. No evidence of mitral stenosis.   4. The aortic valve is tricuspid. Aortic valve regurgitation is not  visualized. No aortic stenosis is present.   5. The inferior vena cava is normal in size with greater than 50%  respiratory variability, suggesting right atrial pressure of 3 mmHg.  Antimicrobials: None    Subjective: No concerns.  Objective: Vitals:   04/03/21 1400 04/03/21 2110 04/04/21 0440 04/04/21 0851  BP: 132/75 117/76 123/83 115/82  Pulse: (!) 106 91 96 96  Resp: $Remo'20 19 18   'WAvfj$ Temp: 98.3 F (36.8 C) 97.8 F (36.6 C) 97.7 F (36.5 C)   TempSrc: Oral Oral Oral   SpO2: 99% 97% 95%   Weight:   72.8 kg   Height:        Intake/Output Summary (Last 24 hours) at 04/04/2021 1321 Last data filed at 04/04/2021 0854 Gross per 24 hour  Intake 3 ml  Output --  Net 3 ml    Filed Weights   04/02/21 0522 04/03/21 0500 04/04/21 0440  Weight: 77.6 kg 78 kg 72.8 kg    Examination:  General exam: Appears calm and comfortable Respiratory system: Clear to auscultation. Respiratory effort normal. Cardiovascular system: S1 & S2 heard, RRR. No murmurs, rubs, gallops or clicks. Gastrointestinal  system: Abdomen is nondistended, soft and nontender. No organomegaly or masses felt. Normal bowel sounds heard. Central nervous system: Alert and oriented. No focal neurological deficits. Musculoskeletal: No edema.  No calf tenderness Skin: No cyanosis. No rashes Psychiatry: Judgement and insight appear normal. Mood & affect appropriate.     Data Reviewed: I have personally reviewed following labs and imaging studies  CBC Lab Results  Component Value Date   WBC 6.4 03/26/2021   RBC 4.65 03/26/2021   HGB 14.5 03/26/2021   HCT 42.6 03/26/2021   MCV 91.6 03/26/2021   MCH 31.2 03/26/2021   PLT 253 03/26/2021   MCHC 34.0 03/26/2021   RDW 14.9 03/26/2021   LYMPHSABS 0.4 (L) 03/26/2021   MONOABS 0.3 03/26/2021   EOSABS 0.0 03/26/2021   BASOSABS 0.0 79/07/4095     Last metabolic panel Lab Results  Component Value Date   NA 134 (L) 03/31/2021   K 3.8 03/31/2021   CL 106 03/31/2021   CO2 22 03/31/2021   BUN 13 03/31/2021   CREATININE 0.87 04/02/2021   GLUCOSE 96 03/31/2021   GFRNONAA >60 04/02/2021   GFRAA  08/24/2008    >60        The eGFR has been calculated using the MDRD equation. This calculation has not been validated in all clinical situations. eGFR's persistently <60 mL/min signify possible Chronic Kidney Disease.   CALCIUM 8.8 (L) 03/31/2021   PROT 6.6 04/02/2021   ALBUMIN 2.8 (L) 04/02/2021   BILITOT 0.7 04/02/2021   ALKPHOS 62 04/02/2021   AST 75 (H) 04/02/2021   ALT 85 (H) 04/02/2021   ANIONGAP 6 03/31/2021    CBG (last 3)  No results for input(s): GLUCAP in the last 72 hours.   GFR: Estimated Creatinine Clearance: 65.5 mL/min (by C-G formula based on SCr of 0.87 mg/dL).  Coagulation Profile: No results for input(s): INR, PROTIME in the last 168 hours.  Recent Results (from the past 240 hour(s))  Resp Panel by RT-PCR (Flu A&B, Covid) Nasopharyngeal Swab     Status: None   Collection Time: 03/26/21 10:14 PM   Specimen: Nasopharyngeal Swab; Nasopharyngeal(NP) swabs in vial transport medium  Result Value Ref Range Status   SARS Coronavirus 2 by RT PCR NEGATIVE NEGATIVE Final    Comment: (NOTE) SARS-CoV-2 target nucleic acids are NOT DETECTED.  The  SARS-CoV-2 RNA is generally detectable in upper respiratory specimens during the acute phase of infection. The lowest concentration of SARS-CoV-2 viral copies this assay can detect is 138 copies/mL. A negative result does not preclude SARS-Cov-2 infection and should not be used as the sole basis for treatment or other patient management decisions. A negative result may occur with  improper specimen collection/handling, submission of specimen other than nasopharyngeal swab, presence of viral mutation(s) within the areas targeted by this assay, and inadequate number of viral copies(<138 copies/mL). A negative result must be combined with clinical observations, patient history, and epidemiological information. The expected result is Negative.  Fact Sheet for Patients:  EntrepreneurPulse.com.au  Fact Sheet for Healthcare Providers:  IncredibleEmployment.be  This test is no t yet approved or cleared by the [place] FDA and  has been authorized for detection and/or diagnosis of SARS-CoV-2 by FDA under an Emergency Use Authorization (EUA). This EUA will remain  in effect (meaning this test can be used) for the duration of the COVID-19 declaration under Section 564(b)(1) of the Act, 21 U.S.C.section 360bbb-3(b)(1), unless the authorization is terminated  or revoked sooner.  Influenza A by PCR NEGATIVE NEGATIVE Final   Influenza B by PCR NEGATIVE NEGATIVE Final    Comment: (NOTE) The Xpert Xpress SARS-CoV-2/FLU/RSV plus assay is intended as an aid in the diagnosis of influenza from Nasopharyngeal swab specimens and should not be used as a sole basis for treatment. Nasal washings and aspirates are unacceptable for Xpert Xpress SARS-CoV-2/FLU/RSV testing.  Fact Sheet for Patients: EntrepreneurPulse.com.au  Fact Sheet for Healthcare Providers: IncredibleEmployment.be  This test is not yet approved or  cleared by the [place] FDA and has been authorized for detection and/or diagnosis of SARS-CoV-2 by FDA under an Emergency Use Authorization (EUA). This EUA will remain in effect (meaning this test can be used) for the duration of the COVID-19 declaration under Section 564(b)(1) of the Act, 21 U.S.C. section 360bbb-3(b)(1), unless the authorization is terminated or revoked.  Performed at [facility], [place] [address], [place], [place] [address]         Radiology Studies: No results found.      Scheduled Meds:  amLODipine  5 mg Oral Daily   buPROPion  300 mg Oral Daily   enoxaparin (LOVENOX) injection  40 mg Subcutaneous Q24H   escitalopram  20 mg Oral Daily   lisinopril  30 mg Oral Daily   pantoprazole  40 mg Oral Daily   predniSONE  20 mg Oral Q breakfast   sodium chloride flush  3 mL Intravenous Q12H   Continuous Infusions:   LOS: 8 days     Cordelia Poche, MD [facility] 04/04/2021, 1:21 PM  If 7PM-7AM, please contact night-coverage [email or web address]

## 2021-04-04 NOTE — TOC Initial Note (Signed)
Transition of Care [facility]) - Initial/Assessment Note    Patient Details  Name: Carrie [name] MRN: [id number] Date of Birth: 15-Apr-1961  [facility]) CM/SW Contact:    Ida Rogue, LCSW Phone Number: 04/04/2021, 3:00 PM  Clinical Narrative:   Patient seen in follow up to message from CIR wondering if we had talked with patient about Plan B.  We had not.  Carrie [name] lives by herself in [place], cites the support of a sister, friends, and an adult son.  She has her heart set on CIR as her sister used to work there and highly recommends it.  However, she was willing to entertain a plan B. I explained the difference between CIR and short term rehab, bed search process and insurance authorization request process. I will send her information out to rehabs to see who makes a bed offer as a back up plan to CIR. TOC will continue to follow during the course of hospitalization.                 Expected Discharge Plan: Skilled Nursing Facility Barriers to Discharge: SNF Pending bed offer   Patient Goals and CMS Choice     Choice offered to / list presented to : Patient  Expected Discharge Plan and Services Expected Discharge Plan: Skilled Nursing Facility   Discharge Planning Services: CM Consult Post Acute Care Choice: Skilled Nursing Facility, IP Rehab Living arrangements for the past 2 months: Single Family Home                                      Prior Living Arrangements/Services Living arrangements for the past 2 months: Single Family Home Lives with:: Self Patient language and need for interpreter reviewed:: Yes        Need for Family Participation in Patient Care: Yes (Comment) Care giver support system in place?: Yes (comment)   Criminal Activity/Legal Involvement Pertinent to Current Situation/Hospitalization: No - Comment as needed  Activities of Daily Living Home Assistive Devices/Equipment: None ADL Screening (condition at time of admission) Patient's  cognitive ability adequate to safely complete daily activities?: Yes Is the patient deaf or have difficulty hearing?: No Does the patient have difficulty seeing, even when wearing glasses/contacts?: No Does the patient have difficulty concentrating, remembering, or making decisions?: No Patient able to express need for assistance with ADLs?: Yes Does the patient have difficulty dressing or bathing?: No Independently performs ADLs?: Yes (appropriate for developmental age) Does the patient have difficulty walking or climbing stairs?: No Weakness of Legs: Both Weakness of Arms/Hands: None  Permission Sought/Granted                  Emotional Assessment Appearance:: Appears stated age Attitude/Demeanor/Rapport: Engaged Affect (typically observed): Appropriate Orientation: : Oriented to Self, Oriented to Place, Oriented to  Time, Oriented to Situation Alcohol / Substance Use: Not Applicable Psych Involvement: No (comment)  Admission diagnosis:  Tachycardia [R00.0] Pre-syncope [R55] Generalized weakness [R53.1] Patient Active Problem List   Diagnosis Date Noted   Non-traumatic rhabdomyolysis    Pre-syncope 03/26/2021   Generalized weakness 03/26/2021   Joint pain 03/26/2021   Bronchitis 01/17/2021   Depression, major, single episode, moderate (HCC) 08/25/2018   Depression, major, single episode, severe (HCC) 04/26/2017   Bunion 04/22/2017   Pruritus 08/04/2016   Environmental and seasonal allergies 08/04/2016   BACK PAIN 04/28/2010   OTHER ACUTE REACTIONS  TO STRESS 03/26/2010   HOT FLASHES 01/17/2010   FATIGUE 02/02/2008   Hyperlipidemia LDL goal <100 08/18/2007   Essential hypertension 02/25/2007   Chest pain, unspecified 02/25/2007   REDUCTION MAMMOPLASTY, HX OF 02/25/2007   PCP:  Donato Schultz, DO Pharmacy:   [facility] [place], [place] - [address] [place] [address] Phone: [phone] Fax:  [phone]     Social Determinants of Health (SDOH) Interventions    Readmission Risk Interventions No flowsheet data found.

## 2021-04-05 DIAGNOSIS — R55 Syncope and collapse: Secondary | ICD-10-CM | POA: Diagnosis not present

## 2021-04-05 NOTE — Progress Notes (Signed)
PROGRESS NOTE    Carrie [name]  JOI:[id number] DOB: [birth year] DOA: 03/26/2021 PCP: Ann Held, DO   Brief Narrative: Carrie [name] is a 60 y.o. female with a history of hypertension, hyperlipidemia, depression. Patient presented after a fall in setting of progressive weakness and pain. Workup significant for likely rheumatoid arthritis with improvement of symptoms on prednisone.   Assessment & Plan:   Principal Problem:   Pre-syncope Active Problems:   Hyperlipidemia LDL goal <100   Depression, major, single episode, moderate (HCC)   Generalized weakness   Joint pain   Non-traumatic rhabdomyolysis   Generalized pain Rheumatoid arthritis Workup inpatient was significant for elevated RA (101.4), positive ANA, negative CCP, mildly elevated ESR (26) and CRP (2.5). Outpatient rheumatology, Dr. Kathlene November, curbside consulted and recommended treatment for rheumatoid arthritis with prednisone and taper. -Continue prednisone 15 mg daily with continued taper on the way down to zero around 4-5 days prior to office visit  Presyncope Unsure of etiology. No arrhythmias noted. Transthoracic Echocardiogram with normal EF.  Elevated CK Improved.  Elevated AST/ALT Possibly related to elevated CK.  Hypokalemia Repleted.  Skin rash Improved.  Primary hypertension Patient is on amlodipine and lisinopril as an outpatient. -Continue amlodipine and lisinopril  Hyperlipidemia On Lipitor as an outpatient which was held secondary to AST/ALT.   Depression -Continue Wellbutrin and Lexapro  Confusion Possibly cognitive impairment. Recommendation for outpatient neurology/neuropsychiatry consult. Normal TSH. Elevated B12. Resolved.   DVT prophylaxis: Lovenox Code Status:   Code Status: Full Code Family Communication: None at bedside Disposition Plan: Discharge to CIR pending bed availability. Medically stable for discharge.   Consultants:  PM&R  Procedures:  TRANSTHORACIC  ECHOCARDIOGRAM (03/27/21) IMPRESSIONS     1. Left ventricular ejection fraction, by estimation, is 60 to 65%. The  left ventricle has normal function. The left ventricle has no regional  wall motion abnormalities. Left ventricular diastolic parameters are  consistent with Grade I diastolic  dysfunction (impaired relaxation).   2. Right ventricular systolic function is normal. The right ventricular  size is normal. Tricuspid regurgitation signal is inadequate for assessing  PA pressure.   3. The mitral valve is normal in structure. No evidence of mitral valve  regurgitation. No evidence of mitral stenosis.   4. The aortic valve is tricuspid. Aortic valve regurgitation is not  visualized. No aortic stenosis is present.   5. The inferior vena cava is normal in size with greater than 50%  respiratory variability, suggesting right atrial pressure of 3 mmHg.  Antimicrobials: None    Subjective: No issues noted overnight.  Objective: Vitals:   04/04/21 0851 04/04/21 1300 04/04/21 2121 04/05/21 0439  BP: 115/82 132/76 98/82 124/88  Pulse: 96 82 92 98  Resp:  _0 Temp:  98.3 F (36.8 C) 97.7 F (36.5 C) 98 F (36.7 C)  TempSrc:  Oral Oral Oral  SpO2:  96% 91% 94%  Weight:    76.6 kg  Height:       No intake or output data in the 24 hours ending 04/05/21 1220  Filed Weights   04/03/21 0500 04/04/21 0440 04/05/21 0439  Weight: 78 kg 72.8 kg 76.6 kg    Examination:  General: Well appearing, no distress    Data Reviewed: I have personally reviewed following labs and imaging studies  CBC Lab Results  Component Value Date   WBC 6.4 03/26/2021   RBC 4.65 03/26/2021   HGB 14.5 03/26/2021   HCT 42.6 03/26/2021  MCV 91.6 03/26/2021   MCH 31.2 03/26/2021   PLT 253 03/26/2021   MCHC 34.0 03/26/2021   RDW 14.9 03/26/2021   LYMPHSABS 0.4 (L) 03/26/2021   MONOABS 0.3 03/26/2021   EOSABS 0.0 03/26/2021   BASOSABS 0.0 16/03/9603     Last metabolic panel Lab  Results  Component Value Date   NA 134 (L) 03/31/2021   K 3.8 03/31/2021   CL 106 03/31/2021   CO2 22 03/31/2021   BUN 13 03/31/2021   CREATININE 0.87 04/02/2021   GLUCOSE 96 03/31/2021   GFRNONAA >60 04/02/2021   GFRAA  08/24/2008    >60        The eGFR has been calculated using the MDRD equation. This calculation has not been validated in all clinical situations. eGFR's persistently <60 mL/min signify possible Chronic Kidney Disease.   CALCIUM 8.8 (L) 03/31/2021   PROT 6.6 04/02/2021   ALBUMIN 2.8 (L) 04/02/2021   BILITOT 0.7 04/02/2021   ALKPHOS 62 04/02/2021   AST 75 (H) 04/02/2021   ALT 85 (H) 04/02/2021   ANIONGAP 6 03/31/2021    CBG (last 3)  No results for input(s): GLUCAP in the last 72 hours.   GFR: Estimated Creatinine Clearance: 67.2 mL/min (by C-G formula based on SCr of 0.87 mg/dL).  Coagulation Profile: No results for input(s): INR, PROTIME in the last 168 hours.  Recent Results (from the past 240 hour(s))  Resp Panel by RT-PCR (Flu A&B, Covid) Nasopharyngeal Swab     Status: None   Collection Time: 03/26/21 10:14 PM   Specimen: Nasopharyngeal Swab; Nasopharyngeal(NP) swabs in vial transport medium  Result Value Ref Range Status   SARS Coronavirus 2 by RT PCR NEGATIVE NEGATIVE Final    Comment: (NOTE) SARS-CoV-2 target nucleic acids are NOT DETECTED.  The SARS-CoV-2 RNA is generally detectable in upper respiratory specimens during the acute phase of infection. The lowest concentration of SARS-CoV-2 viral copies this assay can detect is 138 copies/mL. A negative result does not preclude SARS-Cov-2 infection and should not be used as the sole basis for treatment or other patient management decisions. A negative result may occur with  improper specimen collection/handling, submission of specimen other than nasopharyngeal swab, presence of viral mutation(s) within the areas targeted by this assay, and inadequate number of viral copies(<138  copies/mL). A negative result must be combined with clinical observations, patient history, and epidemiological information. The expected result is Negative.  Fact Sheet for Patients:  EntrepreneurPulse.com.au  Fact Sheet for Healthcare Providers:  IncredibleEmployment.be  This test is no t yet approved or cleared by the [place] FDA and  has been authorized for detection and/or diagnosis of SARS-CoV-2 by FDA under an Emergency Use Authorization (EUA). This EUA will remain  in effect (meaning this test can be used) for the duration of the COVID-19 declaration under Section 564(b)(1) of the Act, 21 U.S.C.section 360bbb-3(b)(1), unless the authorization is terminated  or revoked sooner.       Influenza A by PCR NEGATIVE NEGATIVE Final   Influenza B by PCR NEGATIVE NEGATIVE Final    Comment: (NOTE) The Xpert Xpress SARS-CoV-2/FLU/RSV plus assay is intended as an aid in the diagnosis of influenza from Nasopharyngeal swab specimens and should not be used as a sole basis for treatment. Nasal washings and aspirates are unacceptable for Xpert Xpress SARS-CoV-2/FLU/RSV testing.  Fact Sheet for Patients: EntrepreneurPulse.com.au  Fact Sheet for Healthcare Providers: IncredibleEmployment.be  This test is not yet approved or cleared by the [place] and  has been authorized for detection and/or diagnosis of SARS-CoV-2 by FDA under an Emergency Use Authorization (EUA). This EUA will remain in effect (meaning this test can be used) for the duration of the COVID-19 declaration under Section 564(b)(1) of the Act, 21 U.S.C. section 360bbb-3(b)(1), unless the authorization is terminated or revoked.  Performed at [facility], [place] [address] [place], [place] [address]         Radiology Studies: No results found.      Scheduled Meds:  amLODipine  5 mg Oral Daily   buPROPion   300 mg Oral Daily   enoxaparin (LOVENOX) injection  40 mg Subcutaneous Q24H   escitalopram  20 mg Oral Daily   lisinopril  30 mg Oral Daily   pantoprazole  40 mg Oral Daily   predniSONE  15 mg Oral Q breakfast   sodium chloride flush  3 mL Intravenous Q12H   Continuous Infusions:   LOS: 9 days     Cordelia Poche, MD [facility] 04/05/2021, 12:20 PM  If 7PM-7AM, please contact night-coverage [email or web address]

## 2021-04-05 NOTE — Progress Notes (Signed)
Occupational Therapy Treatment Patient Details Name: Carrie [name] MRN: [id number] DOB: 1961-01-24 Today's Date: 04/05/2021   History of present illness 60 year old female  sustained a mechanical fall at home on 02/01/2021 after missing a step. Patient  has continued to feel progressively weak, generalized pains, dizzy without vertigo, no syncope or near syncope and presented to the ED 03/26/21. Admitted for presyncope, dehydration and generalized weakness.  Rheumatological work-up initiated.  Rheumatoid factor markedly elevated.  Initiated prednisone. PMH: HTN, hyperlipidemia, depression   OT comments  Patient was educated on ECT to use during daily tasks in hospital. Patient verbalized understanding. Patient participated in dynamic standing balance challenges to increase activity tolerance and ability to participate in Adls with reduced caregiver assistance.Patient's discharge plan remains appropriate at this time. OT will continue to follow acutely.     Recommendations for follow up therapy are one component of a multi-disciplinary discharge planning process, led by the attending physician.  Recommendations may be updated based on patient status, additional functional criteria and insurance authorization.    Follow Up Recommendations  Acute inpatient rehab (3hours/day)    Assistance Recommended at Discharge    Equipment Recommendations  [facility]    Recommendations for Other Services Rehab consult    Precautions / Restrictions Precautions Precautions: Fall Precaution Comments: fatigues quickly Restrictions Weight Bearing Restrictions: No       Mobility Bed Mobility Overal bed mobility: Needs Assistance Bed Mobility: Supine to Sit     Supine to sit: Min assist          Transfers Overall transfer level: Needs assistance Equipment used: Rolling walker (2 wheels) Transfers: Sit to/from Stand Sit to Stand: Min assist           General transfer comment: light assist to  rise and control descent     Balance                                           ADL either performed or assessed with clinical judgement   ADL Overall ADL's : Needs assistance/impaired                         Toilet Transfer: Minimal assistance;Ambulation;Rolling walker (2 wheels) Toilet Transfer Details (indicate cue type and reason): patient needed physical assistance for sit to stand from edge of bed to make it to commode with min guard with RW once in standing to complete functional mobility to bathroom Toileting- Clothing Manipulation and Hygiene: Min guard;Sit to/from stand         General ADL Comments: patient was educated on ECT and ways to apply them to daily activities. patient verbalized understanding. patient was provided with encouragement to apply strategies to daily activities tomorrow to prioritize energy towards needed tasks v.s. wants. patient verbalized understanding     Vision       Perception     Praxis      Cognition Arousal/Alertness: Awake/alert Behavior During Therapy: Flat affect Overall Cognitive Status: No family/caregiver present to determine baseline cognitive functioning Area of Impairment: Problem solving                 Orientation Level: Time;Situation           Problem Solving: Decreased initiation;Requires verbal cues            Exercises     Shoulder Instructions  General Comments      Pertinent Vitals/ Pain       Faces Pain Scale: Hurts a little bit Pain Location: low back and LLE Pain Descriptors / Indicators: Discomfort Pain Intervention(s): Limited activity within patient's tolerance;Monitored during session  Home Living                                          Prior Functioning/Environment              Frequency  Min 2X/week        Progress Toward Goals  OT Goals(current goals can now be found in the care plan section)  Progress towards OT  goals: Progressing toward goals  Acute Rehab OT Goals OT Goal Formulation: With patient Time For Goal Achievement: 04/14/21 Potential to Achieve Goals: Good  Plan Discharge plan remains appropriate    Co-evaluation                 AM-PAC OT "6 Clicks" Daily Activity     Outcome Measure   Help from another person eating meals?: None Help from another person taking care of personal grooming?: A Little Help from another person toileting, which includes using toliet, bedpan, or urinal?: A Little Help from another person bathing (including washing, rinsing, drying)?: A Little Help from another person to put on and taking off regular upper body clothing?: A Little Help from another person to put on and taking off regular lower body clothing?: A Lot 6 Click Score: 18    End of Session Equipment Utilized During Treatment: Gait belt;Rolling walker (2 wheels)  OT Visit Diagnosis: Unsteadiness on feet (R26.81);Other abnormalities of gait and mobility (R26.89);History of falling (Z91.81);Muscle weakness (generalized) (M62.81)   Activity Tolerance Patient tolerated treatment well;Patient limited by fatigue   Patient Left in bed;with call bell/phone within reach   Nurse Communication Mobility status        Time: 6948-5462 OT Time Calculation (min): 30 min  Charges: OT General Charges $OT Visit: 1 Visit OT Treatments $Self Care/Home Management : 23-37 mins  Carrie [name] OTR/L, MS Acute Rehabilitation Department Office# [phone] Pager# [phone]   Carrie [name] Carrie [name] 04/05/2021, 4:14 PM

## 2021-04-06 DIAGNOSIS — F321 Major depressive disorder, single episode, moderate: Secondary | ICD-10-CM | POA: Diagnosis not present

## 2021-04-06 DIAGNOSIS — R55 Syncope and collapse: Secondary | ICD-10-CM | POA: Diagnosis not present

## 2021-04-06 DIAGNOSIS — E785 Hyperlipidemia, unspecified: Secondary | ICD-10-CM | POA: Diagnosis not present

## 2021-04-06 DIAGNOSIS — R531 Weakness: Secondary | ICD-10-CM | POA: Diagnosis not present

## 2021-04-06 NOTE — Progress Notes (Signed)
PROGRESS NOTE    Carrie [name]  PNP:[id number] DOB: [birth year] DOA: 03/26/2021 PCP: Ann Held, DO   Brief Narrative: LISSETT FAVORITE is a 60 y.o. female with a history of hypertension, hyperlipidemia, depression. Patient presented after a fall in setting of progressive weakness and pain. Workup significant for likely rheumatoid arthritis with improvement of symptoms on prednisone.   Assessment & Plan:   Principal Problem:   Pre-syncope Active Problems:   Hyperlipidemia LDL goal <100   Depression, major, single episode, moderate (HCC)   Generalized weakness   Joint pain   Non-traumatic rhabdomyolysis   Generalized pain Rheumatoid arthritis Workup inpatient was significant for elevated RA (101.4), positive ANA, negative CCP, mildly elevated ESR (26) and CRP (2.5). Outpatient rheumatology, Dr. Kathlene November, curbside consulted and recommended treatment for rheumatoid arthritis with prednisone and taper. -Continue prednisone 15 mg daily with continued taper on the way down to zero around 4-5 days prior to office visit (November 11th)  Presyncope Unsure of etiology. No arrhythmias noted. Transthoracic Echocardiogram with normal EF.  Elevated CK Improved.  Elevated AST/ALT Possibly related to elevated CK. Improved.  Hypokalemia Repleted.  Skin rash Improved.  Primary hypertension Patient is on amlodipine and lisinopril as an outpatient. -Continue amlodipine and lisinopril  Hyperlipidemia On Lipitor as an outpatient which was held secondary to AST/ALT.   Depression -Continue Wellbutrin and Lexapro  Confusion Possibly cognitive impairment. Recommendation for outpatient neurology/neuropsychiatry consult. Normal TSH. Elevated B12. Resolved.   DVT prophylaxis: Lovenox Code Status:   Code Status: Full Code Family Communication: None at bedside Disposition Plan: Discharge to CIR pending bed availability. Medically stable for discharge.   Consultants:   PM&R  Procedures:  TRANSTHORACIC ECHOCARDIOGRAM (03/27/21) IMPRESSIONS     1. Left ventricular ejection fraction, by estimation, is 60 to 65%. The  left ventricle has normal function. The left ventricle has no regional  wall motion abnormalities. Left ventricular diastolic parameters are  consistent with Grade I diastolic  dysfunction (impaired relaxation).   2. Right ventricular systolic function is normal. The right ventricular  size is normal. Tricuspid regurgitation signal is inadequate for assessing  PA pressure.   3. The mitral valve is normal in structure. No evidence of mitral valve  regurgitation. No evidence of mitral stenosis.   4. The aortic valve is tricuspid. Aortic valve regurgitation is not  visualized. No aortic stenosis is present.   5. The inferior vena cava is normal in size with greater than 50%  respiratory variability, suggesting right atrial pressure of 3 mmHg.  Antimicrobials: None    Subjective: No concerns today.  Objective: Vitals:   04/05/21 1255 04/05/21 2015 04/06/21 0440 04/06/21 0445  BP: 113/90 124/82 111/87   Pulse: (!) 106 96 95   Resp: _0 Temp: 98.4 F (36.9 C) 98.1 F (36.7 C) 97.8 F (36.6 C)   TempSrc: Oral Oral Oral   SpO2: 98% 97% 96%   Weight:    75.2 kg  Height:        Intake/Output Summary (Last 24 hours) at 04/06/2021 1231 Last data filed at 04/06/2021 0900 Gross per 24 hour  Intake 290 ml  Output --  Net 290 ml    Filed Weights   04/04/21 0440 04/05/21 0439 04/06/21 0445  Weight: 72.8 kg 76.6 kg 75.2 kg    Examination:  General exam: Appears calm and comfortable Respiratory system: Clear to auscultation. Respiratory effort normal. Cardiovascular system: S1 & S2 heard, RRR. No murmurs, rubs, gallops  or clicks. Gastrointestinal system: Abdomen is nondistended, soft and nontender. No organomegaly or masses felt. Normal bowel sounds heard. Central nervous system: Alert and oriented. No focal  neurological deficits. Musculoskeletal: No edema. No calf tenderness Skin: No cyanosis. Psychiatry: Judgement and insight appear normal. Mood & affect appropriate.    Data Reviewed: I have personally reviewed following labs and imaging studies  CBC Lab Results  Component Value Date   WBC 6.4 03/26/2021   RBC 4.65 03/26/2021   HGB 14.5 03/26/2021   HCT 42.6 03/26/2021   MCV 91.6 03/26/2021   MCH 31.2 03/26/2021   PLT 253 03/26/2021   MCHC 34.0 03/26/2021   RDW 14.9 03/26/2021   LYMPHSABS 0.4 (L) 03/26/2021   MONOABS 0.3 03/26/2021   EOSABS 0.0 03/26/2021   BASOSABS 0.0 20/60/1561     Last metabolic panel Lab Results  Component Value Date   NA 134 (L) 03/31/2021   K 3.8 03/31/2021   CL 106 03/31/2021   CO2 22 03/31/2021   BUN 13 03/31/2021   CREATININE 0.87 04/02/2021   GLUCOSE 96 03/31/2021   GFRNONAA >60 04/02/2021   GFRAA  08/24/2008    >60        The eGFR has been calculated using the MDRD equation. This calculation has not been validated in all clinical situations. eGFR's persistently <60 mL/min signify possible Chronic Kidney Disease.   CALCIUM 8.8 (L) 03/31/2021   PROT 6.6 04/02/2021   ALBUMIN 2.8 (L) 04/02/2021   BILITOT 0.7 04/02/2021   ALKPHOS 62 04/02/2021   AST 75 (H) 04/02/2021   ALT 85 (H) 04/02/2021   ANIONGAP 6 03/31/2021    CBG (last 3)  No results for input(s): GLUCAP in the last 72 hours.   GFR: Estimated Creatinine Clearance: 66.6 mL/min (by C-G formula based on SCr of 0.87 mg/dL).  Coagulation Profile: No results for input(s): INR, PROTIME in the last 168 hours.  No results found for this or any previous visit (from the past 240 hour(s)).       Radiology Studies: No results found.      Scheduled Meds:  amLODipine  5 mg Oral Daily   buPROPion  300 mg Oral Daily   enoxaparin (LOVENOX) injection  40 mg Subcutaneous Q24H   escitalopram  20 mg Oral Daily   lisinopril  30 mg Oral Daily   pantoprazole  40 mg Oral Daily    predniSONE  15 mg Oral Q breakfast   sodium chloride flush  3 mL Intravenous Q12H   Continuous Infusions:   LOS: 10 days     Cordelia Poche, MD [facility] 04/06/2021, 12:31 PM  If 7PM-7AM, please contact night-coverage [email or web address]

## 2021-04-06 NOTE — TOC Progression Note (Addendum)
Transition of Care [facility]) - Progression Note    Patient Details  Name: Carrie [name] MRN: [id number] Date of Birth: [birth year]  [facility] CM/SW Contact  Halford Chessman Phone Number: 04/06/2021, 5:28 PM  Clinical Narrative:     Insurance auth received for [facility], just waiting on bed availability.    CSW to continue to follow.   Expected Discharge Plan: Skilled Nursing Facility Barriers to Discharge: SNF Pending bed offer  Expected Discharge Plan and Services Expected Discharge Plan: Skilled Nursing Facility   Discharge Planning Services: CM Consult Post Acute Care Choice: Skilled Nursing Facility, IP Rehab Living arrangements for the past 2 months: Single Family Home                                       Social Determinants of Health (SDOH) Interventions    Readmission Risk Interventions No flowsheet data found.

## 2021-04-06 NOTE — Progress Notes (Signed)
Inpatient Rehab Admissions Coordinator:   I received insurance auth for CIR but do not have a bed for this Pt.  Pt. Updated. I will continue to follow for potential admit pending bed availability.  Megan Salon, MS, CCC-SLP Rehab Admissions Coordinator  [phone] (celll) [phone] (office)

## 2021-04-06 NOTE — Plan of Care (Signed)

## 2021-04-07 DIAGNOSIS — F321 Major depressive disorder, single episode, moderate: Secondary | ICD-10-CM | POA: Diagnosis not present

## 2021-04-07 DIAGNOSIS — E785 Hyperlipidemia, unspecified: Secondary | ICD-10-CM | POA: Diagnosis not present

## 2021-04-07 DIAGNOSIS — R531 Weakness: Secondary | ICD-10-CM | POA: Diagnosis not present

## 2021-04-07 DIAGNOSIS — R55 Syncope and collapse: Secondary | ICD-10-CM | POA: Diagnosis not present

## 2021-04-07 MED ORDER — PREDNISONE 10 MG PO TABS
10.0000 mg | ORAL_TABLET | Freq: Every day | ORAL | Status: DC
  Administered 2021-04-08: 10 mg via ORAL
  Filled 2021-04-07: qty 1

## 2021-04-07 NOTE — Progress Notes (Signed)
Inpatient Rehab Admissions Coordinator:   I do not have a bed for this pt. Today  on CIR but will follow for potential admit later this week pending bed availability.   Megan Salon, MS, CCC-SLP Rehab Admissions Coordinator  [phone] (celll) [phone] (office)

## 2021-04-07 NOTE — Progress Notes (Signed)
PROGRESS NOTE    Carrie [name]  JLL:[id number] DOB: May 15, 1961 DOA: 03/26/2021 PCP: Ann Held, DO   Brief Narrative: Carrie [name] is a 60 y.o. female with a history of hypertension, hyperlipidemia, depression. Patient presented after a fall in setting of progressive weakness and pain. Workup significant for likely rheumatoid arthritis with improvement of symptoms on prednisone.   Assessment & Plan:   Principal Problem:   Pre-syncope Active Problems:   Hyperlipidemia LDL goal <100   Depression, major, single episode, moderate (HCC)   Generalized weakness   Joint pain   Non-traumatic rhabdomyolysis   Generalized pain Rheumatoid arthritis Workup inpatient was significant for elevated RA (101.4), positive ANA, negative CCP, mildly elevated ESR (26) and CRP (2.5). Outpatient rheumatology, Dr. Kathlene November, curbside consulted and recommended treatment for rheumatoid arthritis with prednisone and taper. -Continue prednisone 10 mg daily with continued taper on the way down to zero around 4-5 days prior to office visit (November 11th)  Presyncope Unsure of etiology. No arrhythmias noted. Transthoracic Echocardiogram with normal EF.  Elevated CK Improved.  Elevated AST/ALT Possibly related to elevated CK. Improved.  Hypokalemia Repleted.  Skin rash Improved.  Primary hypertension Patient is on amlodipine and lisinopril as an outpatient. -Continue amlodipine and lisinopril  Hyperlipidemia On Lipitor as an outpatient which was held secondary to AST/ALT.   Depression -Continue Wellbutrin and Lexapro  Confusion Possibly cognitive impairment. Recommendation for outpatient neurology/neuropsychiatry consult. Normal TSH. Elevated B12. Resolved.   DVT prophylaxis: Lovenox Code Status:   Code Status: Full Code Family Communication: None at bedside Disposition Plan: Discharge to CIR pending bed availability. Medically stable for discharge.   Consultants:   PM&R  Procedures:  TRANSTHORACIC ECHOCARDIOGRAM (03/27/21) IMPRESSIONS     1. Left ventricular ejection fraction, by estimation, is 60 to 65%. The  left ventricle has normal function. The left ventricle has no regional  wall motion abnormalities. Left ventricular diastolic parameters are  consistent with Grade I diastolic  dysfunction (impaired relaxation).   2. Right ventricular systolic function is normal. The right ventricular  size is normal. Tricuspid regurgitation signal is inadequate for assessing  PA pressure.   3. The mitral valve is normal in structure. No evidence of mitral valve  regurgitation. No evidence of mitral stenosis.   4. The aortic valve is tricuspid. Aortic valve regurgitation is not  visualized. No aortic stenosis is present.   5. The inferior vena cava is normal in size with greater than 50%  respiratory variability, suggesting right atrial pressure of 3 mmHg.  Antimicrobials: None    Subjective: Some facial puffiness today  Objective: Vitals:   04/06/21 1251 04/06/21 2000 04/07/21 0500 04/07/21 0516  BP: 111/80 114/77  109/86  Pulse: (!) 110 98  97  Resp: _0 Temp: 98.3 F (36.8 C) 97.9 F (36.6 C)  (!) 97.5 F (36.4 C)  TempSrc: Oral Oral  Oral  SpO2: 98% 96%  96%  Weight:   76 kg   Height:        Intake/Output Summary (Last 24 hours) at 04/07/2021 1520 Last data filed at 04/07/2021 1341 Gross per 24 hour  Intake 472 ml  Output --  Net 472 ml    Filed Weights   04/05/21 0439 04/06/21 0445 04/07/21 0500  Weight: 76.6 kg 75.2 kg 76 kg    Examination:  General exam: Appears calm and comfortable Respiratory system: Clear to auscultation. Respiratory effort normal. Cardiovascular system: S1 & S2 heard, RRR. No murmurs,  rubs, gallops or clicks. Gastrointestinal system: Abdomen is nondistended, soft and nontender. No organomegaly or masses felt. Normal bowel sounds heard. Central nervous system: Alert and oriented. No focal  neurological deficits. Musculoskeletal: No edema. No calf tenderness Skin: No cyanosis. No rashes Psychiatry: Judgement and insight appear normal. Mood & affect appropriate.    Data Reviewed: I have personally reviewed following labs and imaging studies  CBC Lab Results  Component Value Date   WBC 6.4 03/26/2021   RBC 4.65 03/26/2021   HGB 14.5 03/26/2021   HCT 42.6 03/26/2021   MCV 91.6 03/26/2021   MCH 31.2 03/26/2021   PLT 253 03/26/2021   MCHC 34.0 03/26/2021   RDW 14.9 03/26/2021   LYMPHSABS 0.4 (L) 03/26/2021   MONOABS 0.3 03/26/2021   EOSABS 0.0 03/26/2021   BASOSABS 0.0 85/27/7824     Last metabolic panel Lab Results  Component Value Date   NA 134 (L) 03/31/2021   K 3.8 03/31/2021   CL 106 03/31/2021   CO2 22 03/31/2021   BUN 13 03/31/2021   CREATININE 0.87 04/02/2021   GLUCOSE 96 03/31/2021   GFRNONAA >60 04/02/2021   GFRAA  08/24/2008    >60        The eGFR has been calculated using the MDRD equation. This calculation has not been validated in all clinical situations. eGFR's persistently <60 mL/min signify possible Chronic Kidney Disease.   CALCIUM 8.8 (L) 03/31/2021   PROT 6.6 04/02/2021   ALBUMIN 2.8 (L) 04/02/2021   BILITOT 0.7 04/02/2021   ALKPHOS 62 04/02/2021   AST 75 (H) 04/02/2021   ALT 85 (H) 04/02/2021   ANIONGAP 6 03/31/2021    CBG (last 3)  No results for input(s): GLUCAP in the last 72 hours.   GFR: Estimated Creatinine Clearance: 66.9 mL/min (by C-G formula based on SCr of 0.87 mg/dL).  Coagulation Profile: No results for input(s): INR, PROTIME in the last 168 hours.  No results found for this or any previous visit (from the past 240 hour(s)).       Radiology Studies: No results found.      Scheduled Meds:  amLODipine  5 mg Oral Daily   buPROPion  300 mg Oral Daily   enoxaparin (LOVENOX) injection  40 mg Subcutaneous Q24H   escitalopram  20 mg Oral Daily   lisinopril  30 mg Oral Daily   pantoprazole  40 mg  Oral Daily   predniSONE  15 mg Oral Q breakfast   sodium chloride flush  3 mL Intravenous Q12H   Continuous Infusions:   LOS: 11 days     Cordelia Poche, MD [facility] 04/07/2021, 3:20 PM  If 7PM-7AM, please contact night-coverage [email or web address]

## 2021-04-07 NOTE — Progress Notes (Signed)
Physical Therapy Treatment Patient Details Name: Carrie [name] MRN: [id number] DOB: 09/19/1960 Today's Date: 04/07/2021   History of Present Illness 60 year old female  sustained a mechanical fall at home on 02/01/2021 after missing a step. Patient  has continued to feel progressively weak, generalized pains, dizzy without vertigo, no syncope or near syncope and presented to the ED 03/26/21. Admitted for presyncope, dehydration and generalized weakness.  Rheumatological work-up initiated.  Rheumatoid factor markedly elevated.  Initiated prednisone. PMH: HTN, hyperlipidemia, depression    PT Comments    The patient requires encouragement to ambulate this PM. Patient  requires asistance with trunk and legs, inconsisten cyas noted in previous notes.  Patient did ambulate x 80'.  Continue PT, will benefit from post acute rehab.  Recommendations for follow up therapy are one component of a multi-disciplinary discharge planning process, led by the attending physician.  Recommendations may be updated based on patient status, additional functional criteria and insurance authorization.  Follow Up Recommendations  Acute inpatient rehab (3hours/day)     Assistance Recommended at Discharge None  Equipment Recommendations  None recommended by PT    Recommendations for Other Services       Precautions / Restrictions Precautions Precautions: Fall Precaution Comments: fatigues quickly     Mobility  Bed Mobility   Bed Mobility: Supine to Sit;Sit to Supine     Supine to sit: Mod assist Sit to supine: Mod assist   General bed mobility comments: extra timr and effort to sit upright,and legs back onto bed. Much encouragement to self perform    Transfers Overall transfer level: Needs assistance Equipment used: Rolling walker (2 wheels) Transfers: Sit to/from Stand Sit to Stand: Min assist           General transfer comment: light assist to rise and control descent     Ambulation/Gait Ambulation/Gait assistance: Min guard;Min assist Gait Distance (Feet): 80 Feet Assistive device: Rolling walker (2 wheels) Gait Pattern/deviations: Step-through pattern;Decreased stride length;Trunk flexed     General Gait Details: cues for turning, patient had decreased problem solve to turn with RW.   Stairs             Wheelchair Mobility    Modified Rankin (Stroke Patients Only)       Balance Overall balance assessment: History of Falls;Needs assistance Sitting-balance support: No upper extremity supported;Feet supported Sitting balance-Leahy Scale: Good     Standing balance support: During functional activity;Bilateral upper extremity supported;Reliant on assistive device for balance Standing balance-Leahy Scale: Poor                              Cognition Arousal/Alertness: Awake/alert Behavior During Therapy: Flat affect;WFL for tasks assessed/performed Overall Cognitive Status: No family/caregiver present to determine baseline cognitive functioning Area of Impairment: Problem solving                 Orientation Level: Time;Situation           Problem Solving: Decreased initiation;Requires verbal cues General Comments: patient follows cues with increased time with patient noted to be flat during interactions with therapist and sister who was present at start of session        Exercises      General Comments        Pertinent Vitals/Pain Faces Pain Scale: Hurts a little bit Pain Location: low back and LLE Pain Descriptors / Indicators: Discomfort Pain Intervention(s): Monitored during session    Home Living  Prior Function            PT Goals (current goals can now be found in the care plan section) Progress towards PT goals: Progressing toward goals    Frequency    Min 2X/week      PT Plan Current plan remains appropriate    Co-evaluation               AM-PAC PT "6 Clicks" Mobility   Outcome Measure  Help needed turning from your back to your side while in a flat bed without using bedrails?: A Little Help needed moving from lying on your back to sitting on the side of a flat bed without using bedrails?: A Little Help needed moving to and from a bed to a chair (including a wheelchair)?: A Little Help needed standing up from a chair using your arms (e.g., wheelchair or bedside chair)?: A Little Help needed to walk in hospital room?: A Little Help needed climbing 3-5 steps with a railing? : A Lot 6 Click Score: 17    End of Session Equipment Utilized During Treatment: Gait belt Activity Tolerance: Patient limited by fatigue Patient left: in bed;with call bell/phone within reach;with bed alarm set Nurse Communication: Mobility status PT Visit Diagnosis: History of falling (Z91.81);Difficulty in walking, not elsewhere classified (R26.2)     Time: 9702-6378 PT Time Calculation (min) (ACUTE ONLY): 18 min  Charges:  $Gait Training: 8-22 mins                     Blanchard Kelch PT Acute Rehabilitation Services Pager [phone] Office [phone]    [name] 04/07/2021, 4:13 PM

## 2021-04-08 ENCOUNTER — Other Ambulatory Visit: Payer: Self-pay

## 2021-04-08 ENCOUNTER — Inpatient Hospital Stay (HOSPITAL_COMMUNITY)
Admission: RE | Admit: 2021-04-08 | Discharge: 2021-04-30 | DRG: 945 | Disposition: A | Discharge status: Home Health | Payer: Federal, State, Local not specified - PPO | Source: Other Acute Inpatient Hospital | Attending: Physical Medicine and Rehabilitation | Admitting: Physical Medicine and Rehabilitation

## 2021-04-08 ENCOUNTER — Encounter (HOSPITAL_COMMUNITY): Payer: Self-pay | Admitting: Physical Medicine and Rehabilitation

## 2021-04-08 DIAGNOSIS — W108XXD Fall (on) (from) other stairs and steps, subsequent encounter: Secondary | ICD-10-CM | POA: Diagnosis present

## 2021-04-08 DIAGNOSIS — Z79899 Other long term (current) drug therapy: Secondary | ICD-10-CM

## 2021-04-08 DIAGNOSIS — Z87891 Personal history of nicotine dependence: Secondary | ICD-10-CM

## 2021-04-08 DIAGNOSIS — F32A Depression, unspecified: Secondary | ICD-10-CM

## 2021-04-08 DIAGNOSIS — E43 Unspecified severe protein-calorie malnutrition: Secondary | ICD-10-CM | POA: Diagnosis present

## 2021-04-08 DIAGNOSIS — I9589 Other hypotension: Secondary | ICD-10-CM | POA: Diagnosis not present

## 2021-04-08 DIAGNOSIS — Z833 Family history of diabetes mellitus: Secondary | ICD-10-CM | POA: Diagnosis not present

## 2021-04-08 DIAGNOSIS — R5381 Other malaise: Principal | ICD-10-CM | POA: Diagnosis present

## 2021-04-08 DIAGNOSIS — M069 Rheumatoid arthritis, unspecified: Secondary | ICD-10-CM | POA: Diagnosis not present

## 2021-04-08 DIAGNOSIS — R55 Syncope and collapse: Secondary | ICD-10-CM | POA: Diagnosis not present

## 2021-04-08 DIAGNOSIS — E785 Hyperlipidemia, unspecified: Secondary | ICD-10-CM | POA: Diagnosis present

## 2021-04-08 DIAGNOSIS — Z90710 Acquired absence of both cervix and uterus: Secondary | ICD-10-CM | POA: Diagnosis not present

## 2021-04-08 DIAGNOSIS — Z6826 Body mass index (BMI) 26.0-26.9, adult: Secondary | ICD-10-CM

## 2021-04-08 DIAGNOSIS — F320 Major depressive disorder, single episode, mild: Secondary | ICD-10-CM

## 2021-04-08 DIAGNOSIS — B370 Candidal stomatitis: Secondary | ICD-10-CM | POA: Diagnosis present

## 2021-04-08 DIAGNOSIS — F322 Major depressive disorder, single episode, severe without psychotic features: Secondary | ICD-10-CM | POA: Diagnosis not present

## 2021-04-08 DIAGNOSIS — Z8249 Family history of ischemic heart disease and other diseases of the circulatory system: Secondary | ICD-10-CM | POA: Diagnosis not present

## 2021-04-08 DIAGNOSIS — I10 Essential (primary) hypertension: Secondary | ICD-10-CM | POA: Diagnosis present

## 2021-04-08 DIAGNOSIS — E871 Hypo-osmolality and hyponatremia: Secondary | ICD-10-CM | POA: Diagnosis not present

## 2021-04-08 DIAGNOSIS — R410 Disorientation, unspecified: Secondary | ICD-10-CM

## 2021-04-08 DIAGNOSIS — R1013 Epigastric pain: Secondary | ICD-10-CM

## 2021-04-08 DIAGNOSIS — F418 Other specified anxiety disorders: Secondary | ICD-10-CM

## 2021-04-08 HISTORY — DX: Rheumatoid arthritis, unspecified: M06.9

## 2021-04-08 MED ORDER — ACETAMINOPHEN 325 MG PO TABS
650.0000 mg | ORAL_TABLET | Freq: Four times a day (QID) | ORAL | Status: DC | PRN
  Administered 2021-04-10: 650 mg via ORAL
  Filled 2021-04-08: qty 2

## 2021-04-08 MED ORDER — LISINOPRIL 20 MG PO TABS
30.0000 mg | ORAL_TABLET | Freq: Every day | ORAL | Status: DC
  Administered 2021-04-09 – 2021-04-10 (×2): 30 mg via ORAL
  Filled 2021-04-08 (×2): qty 1

## 2021-04-08 MED ORDER — BUPROPION HCL ER (XL) 300 MG PO TB24
300.0000 mg | ORAL_TABLET | Freq: Every day | ORAL | Status: DC
  Administered 2021-04-09 – 2021-04-30 (×22): 300 mg via ORAL
  Filled 2021-04-08 (×24): qty 1

## 2021-04-08 MED ORDER — PREDNISONE 5 MG PO TABS: ORAL_TABLET | ORAL | Status: DC

## 2021-04-08 MED ORDER — AMLODIPINE BESYLATE 5 MG PO TABS
5.0000 mg | ORAL_TABLET | Freq: Every day | ORAL | Status: DC
  Administered 2021-04-09 – 2021-04-14 (×5): 5 mg via ORAL
  Filled 2021-04-08 (×6): qty 1

## 2021-04-08 MED ORDER — TRAMADOL HCL 50 MG PO TABS
50.0000 mg | ORAL_TABLET | Freq: Two times a day (BID) | ORAL | Status: DC | PRN
  Administered 2021-04-09 – 2021-04-16 (×3): 50 mg via ORAL
  Filled 2021-04-08 (×4): qty 1

## 2021-04-08 MED ORDER — PANTOPRAZOLE SODIUM 40 MG PO TBEC
40.0000 mg | DELAYED_RELEASE_TABLET | Freq: Every day | ORAL | Status: DC
  Administered 2021-04-09 – 2021-04-30 (×22): 40 mg via ORAL
  Filled 2021-04-08 (×22): qty 1

## 2021-04-08 MED ORDER — ESCITALOPRAM OXALATE 10 MG PO TABS
20.0000 mg | ORAL_TABLET | Freq: Every day | ORAL | Status: DC
  Administered 2021-04-09 – 2021-04-30 (×22): 20 mg via ORAL
  Filled 2021-04-08 (×22): qty 2

## 2021-04-08 MED ORDER — ENOXAPARIN SODIUM 40 MG/0.4ML IJ SOSY
40.0000 mg | PREFILLED_SYRINGE | INTRAMUSCULAR | Status: DC
  Administered 2021-04-08 – 2021-04-29 (×22): 40 mg via SUBCUTANEOUS
  Filled 2021-04-08 (×22): qty 0.4

## 2021-04-08 MED ORDER — ENOXAPARIN SODIUM 40 MG/0.4ML IJ SOSY: 40.0000 mg | PREFILLED_SYRINGE | INTRAMUSCULAR | Status: DC

## 2021-04-08 MED ORDER — ACETAMINOPHEN 650 MG RE SUPP: 650.0000 mg | Freq: Four times a day (QID) | RECTAL | Status: DC | PRN

## 2021-04-08 MED ORDER — PREDNISONE 5 MG PO TABS
10.0000 mg | ORAL_TABLET | Freq: Every day | ORAL | Status: DC
  Administered 2021-04-09 – 2021-04-13 (×5): 10 mg via ORAL
  Filled 2021-04-08 (×5): qty 2

## 2021-04-08 NOTE — Progress Notes (Signed)
Inpatient Rehabilitation Center Individual Statement of Services  Patient Name:  Carrie [name]  Date:  04/08/2021  Welcome to the Inpatient Rehabilitation [facility].  Our goal is to provide you with an individualized program based on your diagnosis and situation, designed to meet your specific needs.  With this comprehensive rehabilitation program, you will be expected to participate in at least 3 hours of rehabilitation therapies Monday-Friday, with modified therapy programming on the weekends.  Your rehabilitation program will include the following services:  Physical Therapy (PT), Occupational Therapy (OT), 24 hour per day rehabilitation nursing, Therapeutic Recreaction (TR), Neuropsychology, Care Coordinator, Rehabilitation Medicine, Nutrition Services, and Pharmacy Services  Weekly team conferences will be held on Tuesday to discuss your progress.  Your Inpatient Rehabilitation Care Coordinator will talk with you frequently to get your input and to update you on team discussions.  Team conferences with you and your family in attendance may also be held.  Expected length of stay: 1.5-2 weeks  Overall anticipated outcome: supervision-mod/I level  Depending on your progress and recovery, your program may change. Your Inpatient Rehabilitation Care Coordinator will coordinate services and will keep you informed of any changes. Your Inpatient Rehabilitation Care Coordinator's name and contact numbers are listed  below.  The following services may also be recommended but are not provided by the Inpatient Rehabilitation Center:  Driving Evaluations Home Health Rehabiltiation Services Outpatient Rehabilitation Services    Arrangements will be made to provide these services after discharge if needed.  Arrangements include referral to agencies that provide these services.  Your insurance has been verified to be:  [employer] Your primary doctor is:  Seabron Spates  Pertinent information will be  shared with your doctor and your insurance company.  Inpatient Rehabilitation Care Coordinator:  Dossie Der, [place] [phone] or Luna Glasgow  Information discussed with and copy given to patient by: Lucy Chris, 04/08/2021, 3:20 PM

## 2021-04-08 NOTE — Progress Notes (Addendum)
Inpatient Rehabilitation Admission Medication Review by a Pharmacist  A complete drug regimen review was completed for this patient to identify any potential clinically significant medication issues.  High Risk Drug Classes Is patient taking? Indication by Medication  Antipsychotic No   Anticoagulant Yes Lovenox for VTE ppx  Antibiotic No   Opioid No   Antiplatelet No   Hypoglycemics/insulin No   Vasoactive Medication Yes Lisinopril / amlodipine for BP control  Chemotherapy No   Other Yes Wellbutrin / Lexapro for depression     Type of Medication Issue Identified Description of Issue Recommendation(s)  Drug Interaction(s) (clinically significant)     Duplicate Therapy     Allergy     No Medication Administration End Date     Incorrect Dose     Additional Drug Therapy Needed     Significant med changes from prior encounter (inform family/care partners about these prior to discharge).    Other       Clinically significant medication issues were identified that warrant physician communication and completion of prescribed/recommended actions by midnight of the next day:  No   Pharmacist comments:  Resume Lipitor as some point - held for increased LFTs? Monitor for Prednisone taper -- 10 mg x 5 days then down to 5 mg for 5 days if appropriate  Time spent performing this drug regimen review (minutes):  20 minutes   Elwin Sleight 04/08/2021 12:13 PM

## 2021-04-08 NOTE — Progress Notes (Signed)
04/08/2021  1115  Report given to [place] at [facility]. Patient is going to Rm# [id number]. ETA for [facility] is 2pm.

## 2021-04-08 NOTE — Progress Notes (Signed)
Inpatient Rehabilitation Care Coordinator Assessment and Plan Patient Details  Name: SHOSHANAH DAPPER MRN: [id number] Date of Birth: 07-09-1960  Today's Date: 04/08/2021  Hospital Problems: Active Problems:   Debility  Past Medical History:  Past Medical History:  Diagnosis Date   Hyperlipidemia    Hypertension    Past Surgical History:  Past Surgical History:  Procedure Laterality Date   ABDOMINAL HYSTERECTOMY     REDUCTION MAMMAPLASTY     Social History:  reports that she quit smoking about 7 years ago. Her smoking use included cigarettes. She has never used smokeless tobacco. She reports current alcohol use. She reports that she does not use drugs.  Family / Support Systems Marital Status: Single Patient Roles: Other (Comment), Parent (Siblings) Children: Adult son who lives out of town Other Supports: Chandra Batch [phone]  Another sister and brother Anticipated Caregiver: Dois Davenport Ability/Limitations of Caregiver: Can check on her-all of her siblings will Caregiver Availability: Intermittent Family Dynamics: Close with her fmaily and son, all pull together when anyone of them has a need. 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 Issues: No issues Guardian/Conservator: None-according to MD pt is capable of making her own decisions while here   Abuse/Neglect Abuse/Neglect Assessment Can Be Completed: Yes Physical Abuse: Denies Verbal Abuse: Denies Sexual Abuse: Denies Exploitation of patient/patient's  resources: Denies Self-Neglect: Denies Possible abuse reported to:: [place] department of social services  Patient response to: Social Isolation - How often do you feel lonely or isolated from those around you?: Never  Emotional Status Pt's affect, behavior and adjustment status: Pt is motivated to do well and regain her independence. She lives alone and was managing even with her tireness and fatigue. She would like for MD's to find out what is causing this. She feels it could be RA now Recent Psychosocial Issues: health issues has had for the past year-fatigue and tired all of the time Psychiatric History: History of depression takes medications for this and feels helps but this pas year has worn on her and she feels down about it. May benefit from seeing neuro-psych while here Substance Abuse History: No issues  Patient / Family Perceptions, Expectations & Goals Pt/Family understanding of illness & functional limitations: Pt and sister can explain up til now what the MD's are telling them and her treatment plan up until now. She was told she may have RA and is being treated now for it. She hopes to do well here and get some of her strength back before going home Premorbid pt/family roles/activities: Sibling, Mm, retiree, church member, neighbor etc Anticipated changes in roles/activities/participation: resume Pt/family expectations/goals: Pt states: " I am hopeful I will do well here and get stronger and move better than I have been."  Dois Davenport states: " I hope it helps to be here."  [employer]: None Premorbid Home Care/DME Agencies: None Transportation available at discharge: Self and family Is the patient able to respond to transportation needs?: Yes In the past 12 months, has lack of transportation kept you from medical appointments or from getting medications?: No In the past 12 months, has  lack of transportation kept you from meetings, work, or from getting  things needed for daily living?: No Resource referrals recommended: Neuropsychology  Discharge Planning Living Arrangements: Alone Support Systems: Children, Other relatives, Friends/neighbors, Church/faith community Type of Residence: Private residence Insurance Resources: Media planner (specify) Print production planner) Surveyor, quantity Resources: Other (Comment) (Postal pension) Financial Screen Referred: No Living Expenses: Rent Money Management: Patient Does the patient have any problems obtaining your medications?: No Home Management: self Patient/Family Preliminary Plans: Return home with family members coming by and checking on her and making sure she has what she needs. She hopes to be better than she was at home prior to admission. She is awar eof team evaluating tomorrow and setting goals. Her sister used to work on acute rehab until retiring, so Airline pilot with process. Care Coordinator Anticipated Follow Up Needs: HH/OP  Clinical Impression Pleasant female who is willing to work in therapies to improve and regain her strength. She has declined the past year and hopefully knows what is causing this now-RA. Her siblings are involved and will provide intermittent assist. Will await therapy evaluations.  Lucy Chris 04/08/2021, 3:18 PM

## 2021-04-08 NOTE — Discharge Summary (Signed)
Physician Discharge Summary  Carrie [name] KMM:[id number] DOB: [birth year] DOA: 03/26/2021  PCP: Ann Held, DO  Admit date: 03/26/2021 Discharge date: 04/08/2021  Admitted From: Home Disposition: Inpatient rehabilitation  Recommendations for Outpatient Follow-up:  Follow up with PCP in 1 week Follow up with Rheumatology on 11/10 If patient will not be discharged prior to Rheumatology appointment, recommendation to discuss with Dr. Kathlene November with regard to prednisone taper Please obtain BMP/CBC in one week Please follow up on the following pending results: None  Home Health: None Equipment/Devices: None  Discharge Condition: Stable CODE STATUS: Full code Diet recommendation: Regular diet   Brief/Interim Summary:  Admission HPI written by Neva Seat, DO  HPI: Carrie [name] is a 60 y.o. female with medical history significant of back pain, depression, allergies, hyperlipidemia, hypertension presenting with continued pain and weakness after fall.  Patient reports that she had a fall around 3 weeks ago after missing a step.  She has had some pain in her joints since then this felt improved.  Pain is worse with ambulation.  She also reports some generalized weakness and dark urine.  She states that she is also had ongoing lightheadedness and dizziness on standing intermittently for some time since she had bronchitis.  She states she went to [facility] urgent [facility] about a week ago got Flexeril and ibuprofen the ibuprofen help with her pain some but did not last.  She also has darkening of the skin along her nasal bridge and under her eyes which she states is new in the last couple days.  She also reports a rash on her upper chest with decreased pigmentation has been there since around the time she had bronchitis.   As above she reports some intermittent lightheadedness and dizziness and feeling she might pass out.  But she denies any actual passing out.  The  symptoms did not precede her fall.  She denies fevers, chills, chest pain, shortness of breath, abdominal pain, constipation, diarrhea, nausea, vomiting.   Hospital course:  Generalized pain Rheumatoid arthritis Workup inpatient was significant for elevated RA (101.4), positive ANA, negative CCP, mildly elevated ESR (26) and CRP (2.5). Outpatient rheumatology, Dr. Kathlene November, curbside consulted and recommended treatment for rheumatoid arthritis with prednisone and taper. Discussed with Dr. Kathlene November on day of discharge who recommended a taper of prednisone 10 mg x5 days followed by prednisone 5 mg x5 days. Appointment set for November 10th, per Dr. Kathlene November.   Presyncope Unsure of etiology. No arrhythmias noted. Transthoracic Echocardiogram with normal EF.   Elevated CK Improved.   Elevated AST/ALT Possibly related to elevated CK. Improved.   Hypokalemia Repleted.   Skin rash Improved.   Primary hypertension Patient is on amlodipine and lisinopril as an outpatient. Continue amlodipine and lisinopril   Hyperlipidemia On Lipitor as an outpatient which was held secondary to AST/ALT.    Depression Continue Wellbutrin and Lexapro   Confusion Possibly cognitive impairment. Recommendation for outpatient neurology/neuropsychiatry consult. Normal TSH. Elevated B12. Resolved.  Discharge Diagnoses:  Principal Problem:   Pre-syncope Active Problems:   Hyperlipidemia LDL goal <100   Depression, major, single episode, moderate (HCC)   Generalized weakness   Joint pain   Non-traumatic rhabdomyolysis    Discharge Instructions   Allergies as of 04/08/2021       Reactions   Zoloft [sertraline Hcl] Diarrhea, Other (See Comments)   Stomach upset        Medication List     STOP taking these medications  IBU 800 MG tablet Generic drug: ibuprofen   ibuprofen 600 MG tablet Commonly known as: IBU   promethazine-dextromethorphan 6.25-15 MG/5ML syrup Commonly known as:  PROMETHAZINE-DM       TAKE these medications    amLODipine 5 MG tablet Commonly known as: NORVASC TAKE 1 TABLET ONCE DAILY.   atorvastatin 40 MG tablet Commonly known as: LIPITOR TAKE ONE TABLET AT BEDTIME. What changed: when to take this   buPROPion 300 MG 24 hr tablet Commonly known as: WELLBUTRIN XL TAKE 1 TABLET EACH DAY. What changed: See the new instructions.   cyclobenzaprine 10 MG tablet Commonly known as: FLEXERIL Take 10 mg by mouth daily as needed for muscle spasms.   escitalopram 20 MG tablet Commonly known as: Lexapro Take 1 tablet (20 mg total) by mouth daily.   fluticasone 50 MCG/ACT nasal spray Commonly known as: FLONASE Place 2 sprays into both nostrils daily. What changed:  when to take this reasons to take this   lisinopril 30 MG tablet Commonly known as: ZESTRIL TAKE 1 TABLET ONCE DAILY.   loratadine 10 MG tablet Commonly known as: CLARITIN Take 1 tablet (10 mg total) by mouth daily.   pantoprazole 40 MG tablet Commonly known as: PROTONIX Take 1 tablet (40 mg total) by mouth daily.   predniSONE 5 MG tablet Commonly known as: DELTASONE Take 2 tablets (10 mg total) by mouth daily with breakfast for 4 days, THEN 1 tablet (5 mg total) daily with breakfast for 5 days. Start taking on: April 09, 2021 What changed:  medication strength See the new instructions.        Follow-up Information     Lahoma Rocker, MD. Go on 04/09/2021.   Specialty: Rheumatology Why: 1:00 PM Contact information: [address] [place] [address] [phone]                Allergies  Allergen Reactions   Zoloft [Sertraline Hcl] Diarrhea and Other (See Comments)    Stomach upset     Consultations: None   Procedures/Studies: DG Chest 2 View  Result Date: 03/26/2021 CLINICAL DATA:  Chest pain and weakness. EXAM: CHEST - 2 VIEW COMPARISON:  Chest x-ray 02/29/2020. FINDINGS: There is linear atelectasis or scarring in the  left lower lobe. The lungs are otherwise clear. There is no pleural effusion or pneumothorax. The cardiomediastinal silhouette is within normal limits. No acute fractures are seen. IMPRESSION: No active cardiopulmonary disease. Electronically Signed   By: Ronney Asters M.D.   On: 03/26/2021 18:34   CT HEAD WO CONTRAST (5MM)  Result Date: 03/26/2021 CLINICAL DATA:  Fall 3 weeks ago. Headache, intracranial hemorrhage suspected EXAM: CT HEAD WITHOUT CONTRAST TECHNIQUE: Contiguous axial images were obtained from the base of the skull through the vertex without intravenous contrast. COMPARISON:  None. FINDINGS: Brain: No acute intracranial abnormality. Specifically, no hemorrhage, hydrocephalus, mass lesion, acute infarction, or significant intracranial injury. Vascular: No hyperdense vessel or unexpected calcification. Skull: No acute calvarial abnormality. Sinuses/Orbits: No acute findings Other: None IMPRESSION: No acute intracranial abnormality. Electronically Signed   By: Rolm Baptise M.D.   On: 03/26/2021 20:32   CT Cervical Spine Wo Contrast  Result Date: 03/26/2021 CLINICAL DATA:  Fall EXAM: CT CERVICAL SPINE WITHOUT CONTRAST TECHNIQUE: Multidetector CT imaging of the cervical spine was performed without intravenous contrast. Multiplanar CT image reconstructions were also generated. COMPARISON:  None. FINDINGS: Alignment: Normal Skull base and vertebrae: No acute fracture. No primary bone lesion or focal pathologic process. Soft tissues and  spinal canal: No prevertebral fluid or swelling. No visible canal hematoma. Disc levels: Mild diffuse degenerative disc disease with disc space narrowing and spurring. Mild diffuse bilateral degenerative facet disease. Upper chest: No acute findings Other: 89 IMPRESSION: No acute bony abnormality. Electronically Signed   By: Rolm Baptise M.D.   On: 03/26/2021 20:34   DG Knee Complete 4 Views Left  Result Date: 03/29/2021 CLINICAL DATA:  Fall 3 weeks ago with  ongoing pain in the left hip and knee. EXAM: LEFT KNEE - COMPLETE 4+ VIEW COMPARISON:  None. FINDINGS: No evidence of fracture, dislocation, or joint effusion. 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 The soft tissues appear normal. IMPRESSION: No acute osseous injury. Electronically Signed   By: Zerita Boers M.D.   On: 03/29/2021 17:52   ECHOCARDIOGRAM COMPLETE  Result Date: 03/27/2021    ECHOCARDIOGRAM REPORT   Patient Name:   Carrie [name] Date of Exam: 03/27/2021 Medical Rec #:  [id number]      Height:       64.0 in Accession #:    [id number]     Weight:       170.4 lb Date of Birth:  Oct 30, 1960       BSA:          1.828 m Patient Age:    60 years       BP:           114/75 mmHg Patient Gender: F              HR:           102 bpm. Exam Location:  Inpatient Procedure: 2D Echo, Cardiac Doppler, Color Doppler and Intracardiac            Opacification Agent Indications:    R55 Syncope  History:        Patient has prior history of Echocardiogram examinations, most                 recent 02/24/2017. Signs/Symptoms:Syncope and Chest Pain; Risk                 Factors:Dyslipidemia and Hypertension.  Sonographer:    Roseanna Rainbow RDCS Referring Phys: [id number] ANAND D HONGALGI  Sonographer Comments: Technically difficult study due to poor echo windows. Image acquisition challenging due to respiratory motion. Images exytemely respiratory dependent IMPRESSIONS  1. Left ventricular ejection fraction, by estimation, is 60 to 65%. The left ventricle has normal function. The left ventricle has no regional wall motion abnormalities. Left ventricular diastolic parameters are consistent with Grade I diastolic dysfunction (impaired relaxation).  2. Right ventricular systolic function is normal. The right ventricular size is normal. Tricuspid regurgitation signal is inadequate for assessing PA pressure.  3. The mitral valve is normal in structure. No evidence of mitral valve regurgitation. No evidence  of mitral stenosis.  4. The aortic valve is tricuspid. Aortic valve regurgitation is not visualized. No aortic stenosis is present.  5. The inferior vena cava is normal in size with greater than 50% respiratory variability, suggesting right atrial pressure of 3 mmHg. FINDINGS  Left Ventricle: Left ventricular ejection fraction, by estimation, is 60 to 65%. The left ventricle has normal function. The left ventricle has no regional wall motion abnormalities. Definity contrast agent was given IV to delineate the left ventricular  endocardial borders. The left ventricular internal cavity size was normal in size. There is no left ventricular hypertrophy. Left ventricular diastolic parameters are consistent with Grade  I diastolic dysfunction (impaired relaxation). Right Ventricle: The right ventricular size is normal. No increase in right ventricular wall thickness. Right ventricular systolic function is normal. Tricuspid regurgitation signal is inadequate for assessing PA pressure. Left Atrium: Left atrial size was normal in size. Right Atrium: Right atrial size was normal in size. Pericardium: There is no evidence of pericardial effusion. Mitral Valve: The mitral valve is normal in structure. No evidence of mitral valve regurgitation. No evidence of mitral valve stenosis. Tricuspid Valve: The tricuspid valve is normal in structure. Tricuspid valve regurgitation is trivial. Aortic Valve: The aortic valve is tricuspid. Aortic valve regurgitation is not visualized. No aortic stenosis is present. Pulmonic Valve: The pulmonic valve was normal in structure. Pulmonic valve regurgitation is not visualized. Aorta: The aortic root is normal in size and structure. Venous: The inferior vena cava is normal in size with greater than 50% respiratory variability, suggesting right atrial pressure of 3 mmHg. IAS/Shunts: No atrial level shunt detected by color flow Doppler.  LEFT VENTRICLE PLAX 2D LVIDd:         3.90 cm     Diastology  LVIDs:         2.40 cm     LV e' medial:    7.29 cm/s LV PW:         1.00 cm     LV E/e' medial:  8.0 LV IVS:        0.90 cm     LV e' lateral:   9.90 cm/s                            LV E/e' lateral: 5.9  LV Volumes (MOD) LV vol d, MOD A2C: 29.2 ml LV vol d, MOD A4C: 35.9 ml LV vol s, MOD A2C: 10.4 ml LV vol s, MOD A4C: 9.0 ml LV SV MOD A2C:     18.8 ml LV SV MOD A4C:     35.9 ml LV SV MOD BP:      22.6 ml RIGHT VENTRICLE             IVC RV S prime:     14.50 cm/s  IVC diam: 1.40 cm TAPSE (M-mode): 1.0 cm LEFT ATRIUM             Index       RIGHT ATRIUM          Index LA diam:        2.70 cm 1.48 cm/m  RA Area:     9.92 cm LA Vol (A2C):   16.0 ml 8.75 ml/m  RA Volume:   18.80 ml 10.29 ml/m LA Vol (A4C):   9.6 ml  5.25 ml/m LA Biplane Vol: 13.5 ml 7.39 ml/m  AORTIC VALVE LVOT Vmax:   93.10 cm/s LVOT Vmean:  58.700 cm/s LVOT VTI:    0.130 m  AORTA Ao Root diam: 2.70 cm Ao Asc diam:  3.10 cm MITRAL VALVE MV Area (PHT): 5.27 cm    SHUNTS MV Decel Time: 144 msec    Systemic VTI: 0.13 m MV E velocity: 58.20 cm/s MV A velocity: 94.60 cm/s MV E/A ratio:  0.62 Dalton McleanMD Electronically signed by Franki Monte Signature Date/Time: 03/27/2021/3:12:58 PM    Final    DG HIP UNILAT WITH PELVIS 2-3 VIEWS LEFT  Result Date: 03/29/2021 CLINICAL DATA:  Fall 3 weeks ago with ongoing pain in the left hip and knee. EXAM: DG HIP (WITH OR WITHOUT  PELVIS) 2-3V LEFT COMPARISON:  None. FINDINGS: There is no evidence of hip fracture or dislocation. Mild-to-moderate degenerative changes are seen in both hips. IMPRESSION: No acute osseous injury. Electronically Signed   By: Zerita Boers M.D.   On: 03/29/2021 17:51   US Abdomen Limited RUQ (LIVER/GB)  Result Date: 03/31/2021 CLINICAL DATA:  Abnormal LFTs EXAM: ULTRASOUND ABDOMEN LIMITED RIGHT UPPER QUADRANT COMPARISON:  None FINDINGS: Gallbladder: Gallbladder distended by sludge. No definite shadowing calculi or gallbladder wall thickening. No pericholecystic fluid or  sonographic Murphy sign. Common bile duct: Diameter: Suboptimally visualized, question 5 mm diameter Liver: Echogenic parenchyma, likely fatty infiltration though this can be seen with cirrhosis and certain infiltrative disorders. No gross hepatic mass or nodularity are identified though assessment of intrahepatic detail is suboptimal due to body habitus, bowel gas, and respiration. Portal vein is patent on color Doppler imaging with normal direction of blood flow towards the liver. Other: No RIGHT upper quadrant free fluid. IMPRESSION: Gallbladder distended by sludge. Probable fatty infiltration of liver as above. Electronically Signed   By: Lavonia Dana M.D.   On: 03/31/2021 13:00     Subjective: Feeling very weak this morning. No other concerns  Discharge Exam: Vitals:   04/08/21 0805 04/08/21 0959  BP: 94/76 101/77  Pulse: (!) 107 94  Resp: 18 20  Temp: 97.7 F (36.5 C) 97.9 F (36.6 C)  SpO2: 100% 97%   Vitals:   04/08/21 0356 04/08/21 0357 04/08/21 0805 04/08/21 0959  BP: 124/83  94/76 101/77  Pulse: 95  (!) 107 94  Resp: _0 Temp: 98 F (36.7 C)  97.7 F (36.5 C) 97.9 F (36.6 C)  TempSrc: Oral  Oral Oral  SpO2: 97%  100% 97%  Weight:  74.4 kg    Height:        General: Pt is alert, awake, not in acute distress Cardiovascular: RRR, S1/S2 +, no rubs, no gallops Respiratory: CTA bilaterally, no wheezing, no rhonchi Abdominal: Soft, NT, ND, bowel sounds + Extremities: no edema, no cyanosis    The results of significant diagnostics from this hospitalization (including imaging, microbiology, ancillary and laboratory) are listed below for reference.     Labs:  Basic Metabolic Panel: Recent Labs  Lab 04/02/21 0508  CREATININE 0.87   Liver Function Tests: Recent Labs  Lab 04/02/21 0508  AST 75*  ALT 85*  ALKPHOS 62  BILITOT 0.7  PROT 6.6  ALBUMIN 2.8*    Time coordinating discharge: 35 minutes  SIGNED:   Cordelia Poche, MD [facility] 04/08/2021, 10:59 AM

## 2021-04-08 NOTE — Plan of Care (Signed)
  Problem: Clinical Measurements: Goal: Respiratory complications will improve Outcome: Progressing   Problem: Activity: Goal: Risk for activity intolerance will decrease Outcome: Progressing   Problem: Nutrition: Goal: Adequate nutrition will be maintained Outcome: Progressing   

## 2021-04-08 NOTE — Progress Notes (Signed)
04/08/2021  1120  [facility] just arrived to transport patient to [facility] Rm# [id number].

## 2021-04-08 NOTE — Discharge Instructions (Addendum)
Carrie [name],  You were in the hospital with fatigue and joint pain with concern for possible inflammatory disease, like rheumatoid arthritis. You have significantly improved with prednisone. You have an appointment with Dr. Deanne Coffer on 11/10. Please go to that appointment.

## 2021-04-08 NOTE — Progress Notes (Signed)
PMR Admission Coordinator Pre-Admission Assessment   Patient: Carrie [name] is an 60 y.o., female MRN: [id number] DOB: 11-Sep-1960 Height: 5\' 4"  (162.6 cm) Weight: 78 kg   Insurance Information HMO:     PPO: yes     PCP:      IPA:      80/20:      OTHER:  PRIMARY: [place]      Policy#: [id number]      Subscriber: Pt CM Name:       Phone#:      Fax#: [phone] Pre-Cert#: [id number]      Employer: I received a call from [facility] 04/04/21 granting [place] [employer] for 7 days with updates due 04/10/21 Benefits:  Phone #: [phone]     Name: Loleta Chance Date: 09/11/2000 - still active Deductible: $350 (met $350) OOP Max: $6,000 ($715 met) CIR: $350/admission copay      SNF: $175/admission copay with 30 days/yr Outpatient: limit 75 visits combined     Co-Pay: $25 copay per visit Home Health: 85% with 50 visits/yr    Co-Pay: 15% DME: 85%     Co-Pay: 15% Providers: in network      SECONDARY:       Policy#:      Phone#:    Development worker, community:       Phone#:    The Engineer, petroleum" for patients in Inpatient Rehabilitation Facilities with attached "Privacy Act [place] Records" was provided and verbally reviewed with: Patient   Emergency Contact Information Contact Information       Name Relation Home Work Mobile    [place] Sister     [phone]           Current Medical History  Patient Admitting Diagnosis: Debility, Generalized Weakness, pre-syncope   History of Present Illness: A 60 year old right-handed female with history of hypertension, hyperlipidemia and depression, quit smoking 7 years ago.  Per chart review patient lives alone.  Two-level home 10 steps to entry.  Bed and bath on second level.  Independent prior to admission.  Presented to [facility] 03/26/2021 with recent fall 3 weeks ago after missing a step without loss of consciousness.  She also reported some generalized weakness and lightheadedness and dizziness on  standing.  By report she had recently gone to the [place] urgent care about a week ago received Flexeril and ibuprofen.  Admission chemistries unremarkable except creatinine 1.03, AST 161, ALT 114, troponin negative, CK 638, lactic acid 1.8, TSH within normal limits, vitamin B12 7158, urinalysis negative nitrite, urine drug screen negative.  Cranial CT scan negative.  CT cervical spine unremarkable.  X-rays of hip and pelvis no acute osseous injury.  Echocardiogram with ejection fraction of 60 to 65% no wall motion abnormalities grade 1 diastolic dysfunction.  Ultrasound of the abdomen gallbladder distended by sludge no gross hepatic mass.  Work-up for possible rheumatoid arthritis positive ANA negative CCP mildly elevated ESR 26 and CRP 2.5 as well as elevated RA(101.4).  Outpatient rheumatology Dr.Aryal curbside consulted recommend treatment with prednisone and taper.  CK improved to 226 as well as improved LFTs..  Subcutaneous Lovenox for DVT prophylaxis.  Tolerating a regular diet.  Therapy evaluations completed due to patient decreased functional mobility.  Patient to be admitted for a comprehensive inpatient rehab program.    Patient's medical record from [facility] has been reviewed by the rehabilitation admission coordinator and physician.   Past Medical History  Past Medical History:  Diagnosis Date   Hyperlipidemia     Hypertension        Has the patient had major surgery during 100 days prior to admission? No   Family History   family history includes Breast cancer in her mother and another family member; Breast cancer (age of onset: 11) in her sister; Coronary artery disease in an other family member; Diabetes in her sister; Hypertension in an other family member.   Current Medications   Current Facility-Administered Medications:    acetaminophen (TYLENOL) tablet 650 mg, 650 mg, Oral, Q6H PRN, 650 mg at 03/29/21 2015 **OR** acetaminophen (TYLENOL)  suppository 650 mg, 650 mg, Rectal, Q6H PRN, Marcelyn Bruins, MD   amLODipine (NORVASC) tablet 5 mg, 5 mg, Oral, Daily, Marcelyn Bruins, MD, 5 mg at 04/03/21 0856   buPROPion (WELLBUTRIN XL) 24 hr tablet 300 mg, 300 mg, Oral, Daily, Marcelyn Bruins, MD, 300 mg at 04/03/21 0855   enoxaparin (LOVENOX) injection 40 mg, 40 mg, Subcutaneous, Q24H, Marcelyn Bruins, MD, 40 mg at 04/02/21 2204   escitalopram (LEXAPRO) tablet 20 mg, 20 mg, Oral, Daily, Marcelyn Bruins, MD, 20 mg at 04/03/21 0855   lisinopril (ZESTRIL) tablet 30 mg, 30 mg, Oral, Daily, Marcelyn Bruins, MD, 30 mg at 04/03/21 0855   pantoprazole (PROTONIX) EC tablet 40 mg, 40 mg, Oral, Daily, Marcelyn Bruins, MD, 40 mg at 04/03/21 0855   phenol (CHLORASEPTIC) mouth spray 1 spray, 1 spray, Mouth/Throat, PRN, Mariel Aloe, MD   predniSONE (DELTASONE) tablet 20 mg, 20 mg, Oral, Q breakfast, Hongalgi, Anand D, MD, 20 mg at 04/03/21 0856   sodium chloride flush (NS) 0.9 % injection 3 mL, 3 mL, Intravenous, Q12H, Marcelyn Bruins, MD, 3 mL at 04/03/21 0856   traMADol (ULTRAM) tablet 50 mg, 50 mg, Oral, Q12H PRN, Modena Jansky, MD, 50 mg at 04/02/21 1431   Patients Current Diet:  Diet Order                  Diet regular Room service appropriate? Yes; Fluid consistency: Thin  Diet effective now                         Precautions / Restrictions Precautions Precautions: Fall Precaution Comments: fatigues quickly Restrictions Weight Bearing Restrictions: No    Has the patient had 2 or more falls or a fall with injury in the past year? Yes   Prior Activity Level Community (5-7x/wk): Pt was active in the community PTA   Prior Functional Level Self Care: Did the patient need help bathing, dressing, using the toilet or eating? Independent   Indoor Mobility: Did the patient need assistance with walking from room to room (with or without device)? Independent   Stairs: Did the patient need  assistance with internal or external stairs (with or without device)? Independent   Functional Cognition: Did the patient need help planning regular tasks such as shopping or remembering to take medications? Independent   Patient Information Are you of Hispanic, Latino/a,or Spanish origin?: A. No, not of Hispanic, Latino/a, or Spanish origin What is your race?: B. Black or African American Do you need or want an interpreter to communicate with a doctor or health care staff?: 0. No   Patient's Response To:  Health Literacy and Transportation Is the patient able to respond to health literacy and transportation needs?: Yes Health Literacy - How often do you need to have  someone help you when you read instructions, pamphlets, or other written material from your doctor or pharmacy?: Never In the past 12 months, has lack of transportation kept you from medical appointments or from getting medications?: No In the past 12 months, has lack of transportation kept you from meetings, work, or from getting things needed for daily living?: No   Development worker, international aid / [place] Devices/Equipment: None Home Equipment: None   Prior Device Use: Indicate devices/aids used by the patient prior to current illness, exacerbation or injury? Walker   Current Functional Level Cognition   Overall Cognitive Status: No family/caregiver present to determine baseline cognitive functioning Orientation Level: Oriented X4 General Comments: patient follows cues with increased time with patient noted to be flat during interactions with therapist and sister who was present at start of session    Extremity Assessment (includes Sensation/Coordination)   Upper Extremity Assessment: Overall WFL for tasks assessed  Lower Extremity Assessment: Defer to PT evaluation     ADLs   Overall ADL's : Needs assistance/impaired Eating/Feeding: Modified independent, Sitting Grooming: Min guard, Wash/dry hands,  Standing Grooming Details (indicate cue type and reason): sink level, decreased activity tolerance - reports sitting frequently Upper Body Bathing: Set up, Standing Upper Body Bathing Details (indicate cue type and reason): patient was quick to fatigue with activity tolerance of about 2.5 mins standing to complete bathing tasks with RW and education for safety and ECT. Lower Body Bathing: Sit to/from stand, Moderate assistance Lower Body Bathing Details (indicate cue type and reason): able to reach down to toes without problem Upper Body Dressing : Set up, Sitting Upper Body Dressing Details (indicate cue type and reason): to don gown backwards like cape Lower Body Dressing: Moderate assistance, Sit to/from stand Lower Body Dressing Details (indicate cue type and reason): patient needed increased assistance to clear undergarments over bilateral feet with education to start with "bad" leg first. Toilet Transfer: Minimal assistance, Ambulation, Rolling walker (2 wheels) Toilet Transfer Details (indicate cue type and reason): patient needed physical assistance for sit to stand from edge of bed to make it to commode with min guard with RW once in standing to complete functional mobility to bathroom Toileting- Clothing Manipulation and Hygiene: Min guard, Sit to/from stand Tub/ Banker: Walk-in shower, Min guard, Ambulation, Rolling walker Functional mobility during ADLs: Minimal assistance, Min guard, Rolling walker General ADL Comments: decreased activity tolerance and in need of enery conservation     Mobility   Overal bed mobility: Needs Assistance Bed Mobility: Supine to Sit Supine to sit: Mod assist Sit to supine: Mod assist General bed mobility comments: mod A to upright trunk and slide BLE over towards EOB, requires increased time to inch BLE     Transfers   Overall transfer level: Needs assistance Equipment used: Rolling walker (2 wheels) Transfers: Sit to/from Stand Sit to  Stand: Min guard General transfer comment: min A to power to stand from EOB and toilet, slow to rise     Ambulation / Gait / Stairs / Wheelchair Mobility   Ambulation/Gait Ambulation/Gait assistance: Counsellor (Feet): 50 Feet (+additional 15 ft in room) Assistive device: Rolling walker (2 wheels) Gait Pattern/deviations: Step-through pattern, Decreased stride length, Trunk flexed General Gait Details: slow step to pattern with trunk slightly flexed over RW, no overt LOB but slightly unsteady, limited by fatigue Gait velocity: decreased     Posture / Balance Balance Overall balance assessment: History of Falls, Needs assistance Sitting-balance support: No upper  extremity supported, Feet supported Sitting balance-Leahy Scale: Good Standing balance support: During functional activity, Bilateral upper extremity supported, No upper extremity supported Standing balance-Leahy Scale: Poor Standing balance comment: reliant on UE support during standing     Special needs/care consideration Skin rash on mid to upper chest     Previous Home Environment (from acute therapy documentation) Living Arrangements: Alone Available Help at Discharge: Family, Available PRN/intermittently Type of Home: House Home Layout: Two level Alternate Level Stairs-Number of Steps: 10 Home Access: Stairs to enter [employer] of Steps: 2 Bathroom Shower/Tub: Multimedia programmer: Standard Bathroom Accessibility: Yes How Accessible: Accessible via walker Home Care Services: No Additional Comments: bed and bath are both on 2nd floor. first floor has half bath and kitchen   Discharge Living Setting Plans for Discharge Living Setting: Alone Type of Home at Discharge: House Discharge Home Layout: Two level, Able to live on main level with bedroom/bathroom Alternate Level Stairs-Rails: Right Alternate Level Stairs-Number of Steps: 10 Discharge Home Access: Stairs to  enter Entrance Stairs-Rails: None Entrance Stairs-Number of Steps: 2 Discharge Bathroom Shower/Tub: Walk-in shower Discharge Bathroom Toilet: Standard Discharge Bathroom Accessibility: Yes How Accessible: Accessible via walker Does the patient have any problems obtaining your medications?: No   Social/Family/Support Systems Patient Roles: Other (Comment) Contact Information: [phone] Anticipated Caregiver: Basilio [place] (sister) Anticipated Caregiver's Contact Information: [phone] Ability/Limitations of Caregiver: Can do min A per pt. Caregiver Availability: Intermittent Does Caregiver/Family have Issues with Lodging/Transportation while Pt is in Rehab?: No   Goals Patient/Family Goal for Rehab: PT/OT Mod I Expected length of stay: 5-7 days Pt/Family Agrees to Admission and willing to participate: Yes Program Orientation Provided & Reviewed with Pt/Caregiver Including Roles  & Responsibilities: Yes   Decrease burden of Care through IP rehab admission: Specialzed equipment needs, Decrease number of caregivers, Bowel and bladder program, and Patient/family education   Possible need for SNF placement upon discharge: not anticipated   Patient Condition: I have reviewed medical records from [facility] , spoken with CM, and patient. I discussed via phone for inpatient rehabilitation assessment.  Patient will benefit from ongoing PT and OT, can actively participate in 3 hours of therapy a day 5 days of the week, and can make measurable gains during the admission.  Patient will also benefit from the coordinated team approach during an Inpatient Acute Rehabilitation admission.  The patient will receive intensive therapy as well as Rehabilitation physician, nursing, social worker, and care management interventions.  Due to safety, disease management, medication administration, pain management, and patient education the patient requires 24 hour a day rehabilitation  nursing.  The patient is currently min a-min guard with mobility and basic ADLs.  Discharge setting and therapy post discharge at home with home health is anticipated.  Patient has agreed to participate in the Acute Inpatient Rehabilitation Program and will admit today.   Preadmission Screen Completed By:  Retta Diones, 04/03/2021 10:58 AM with updates by Clemens Catholic, MS, CCC-SLP   ______________________________________________________________________   Discussed status with Dr. Dagoberto Ligas  on 04/08/21 at 69 and received approval for admission today.   Admission Coordinator:  Retta Diones, RN, time 1030/Date 04/08/21    Assessment/Plan: Diagnosis: Does the need for close, 24 hr/day Medical supervision in concert with the patient's rehab needs make it unreasonable for this patient to be served in a less intensive setting? Yes Co-Morbidities requiring supervision/potential complications: HTN, transaminitis, new'y dx'd RA with pain on prednisone; debility Due to bowel management, safety,  skin/wound care, disease management, medication administration, pain management, and patient education, does the patient require 24 hr/day rehab nursing? Yes Does the patient require coordinated care of a physician, rehab nurse, PT, OT, and SLP to address physical and functional deficits in the context of the above medical diagnosis(es)? Yes Addressing deficits in the following areas: balance, endurance, locomotion, strength, transferring, bathing, dressing, feeding, grooming, and toileting Can the patient actively participate in an intensive therapy program of at least 3 hrs of therapy 5 days a week? Yes The potential for patient to make measurable gains while on inpatient rehab is good Anticipated functional outcomes upon discharge from inpatient rehab: modified independent PT, modified independent OT, n/a SLP Estimated rehab length of stay to reach the above functional goals is: 5-7 days Anticipated discharge  destination: Home 10. Overall Rehab/Functional Prognosis: good     MD Signature:

## 2021-04-08 NOTE — Progress Notes (Signed)
Admisson Note: 1200  Patient arrived to the unit accompanied by EMS. Alert and oriented x4. Patient denies pain; No signs or symptoms of distress noted. Patient oriented to unit and assigned room. Assessments complete. Bed in lowest position, call bell within reach.

## 2021-04-08 NOTE — H&P (Signed)
Physical Medicine and Rehabilitation Admission H&P        Chief Complaint  Patient presents with   Fall        HPI: Carrie [name] is a 60 year old right-handed female with history of hypertension, hyperlipidemia and depression, quit smoking 7 years ago.  Per chart review patient lives alone.  Two-level home 10 steps to entry.  Bed and bath on second level.  Independent prior to admission.  Presented to [facility] long hospital 03/26/2021 with recent fall 3 weeks ago after missing a step without loss of consciousness.  She also reported some generalized weakness and lightheadedness and dizziness on standing.  By report she had recently gone to the [place] urgent care about a week ago received Flexeril and ibuprofen.  Admission chemistries unremarkable except creatinine 1.03, AST 161, ALT 114, troponin negative, CK 638, lactic acid 1.8, TSH within normal limits, vitamin B12 7158, urinalysis negative nitrite, urine drug screen negative.  Cranial CT scan negative.  CT cervical spine unremarkable.  X-rays of hip and pelvis no acute osseous injury.  Echocardiogram with ejection fraction of 60 to 65% no wall motion abnormalities grade 1 diastolic dysfunction.  Ultrasound of the abdomen gallbladder distended by sludge no gross hepatic mass.  Work-up for possible rheumatoid arthritis positive ANA negative CCP mildly elevated ESR 26 and CRP 2.5 as well as elevated RA(101.4).  Outpatient rheumatology Dr.Aryal curbside consulted recommend treatment with prednisone and taper.  CK improved to 226 as well as improved LFTs..  Subcutaneous Lovenox for DVT prophylaxis.  Tolerating a regular diet.  Therapy evaluations completed due to patient decreased functional mobility was admitted for a comprehensive rehab program.   Pt reports pain well controlled- no energy.  Sleeping OK at night. LBM yesterday. Peeing OK.      Review of Systems  Constitutional:  Positive for malaise/fatigue. Negative for chills and  fever.  HENT:  Negative for hearing loss.   Eyes:  Negative for blurred vision and double vision.  Respiratory:  Negative for cough and shortness of breath.   Cardiovascular:  Negative for chest pain, palpitations and leg swelling.  Gastrointestinal:  Positive for constipation. Negative for heartburn, nausea and vomiting.  Genitourinary:  Negative for dysuria, flank pain and hematuria.       Reports of dark cloudy urine  Musculoskeletal:  Positive for back pain, joint pain and myalgias.  Skin:  Negative for rash.  Neurological:  Positive for dizziness.       Lightheadedness sit to stand  Psychiatric/Behavioral:  Positive for depression.   All other systems reviewed and are negative.     Past Medical History:  Diagnosis Date   Hyperlipidemia     Hypertension           Past Surgical History:  Procedure Laterality Date   ABDOMINAL HYSTERECTOMY       REDUCTION MAMMAPLASTY             Family History  Problem Relation Age of Onset   Breast cancer Mother          deceased by 22   Breast cancer Sister 12   Diabetes Sister     Breast cancer Other     Coronary artery disease Other     Hypertension Other      Social History:  reports that she quit smoking about 7 years ago. Her smoking use included cigarettes. She has never used smokeless tobacco. She reports current alcohol use. She reports that she does not  use drugs. Allergies:       Allergies  Allergen Reactions   Zoloft [Sertraline Hcl] Diarrhea and Other (See Comments)      Stomach upset            Medications Prior to Admission  Medication Sig Dispense Refill   amLODipine (NORVASC) 5 MG tablet TAKE 1 TABLET ONCE DAILY. (Patient taking differently: Take 5 mg by mouth daily.) 90 tablet 0   atorvastatin (LIPITOR) 40 MG tablet TAKE ONE TABLET AT BEDTIME. (Patient taking differently: Take 40 mg by mouth daily.) 90 tablet 0   buPROPion (WELLBUTRIN XL) 300 MG 24 hr tablet TAKE 1 TABLET EACH DAY. (Patient taking differently:  Take 300 mg by mouth daily.) 90 tablet 1   cyclobenzaprine (FLEXERIL) 10 MG tablet Take 10 mg by mouth daily as needed for muscle spasms.       escitalopram (LEXAPRO) 20 MG tablet Take 1 tablet (20 mg total) by mouth daily. 30 tablet 2   fluticasone (FLONASE) 50 MCG/ACT nasal spray Place 2 sprays into both nostrils daily. (Patient taking differently: Place 2 sprays into both nostrils daily as needed for allergies.) 16 g 6   IBU 800 MG tablet Take 800 mg by mouth every 8 (eight) hours as needed for moderate pain.       lisinopril (ZESTRIL) 30 MG tablet TAKE 1 TABLET ONCE DAILY. (Patient taking differently: Take 30 mg by mouth daily.) 90 tablet 0   loratadine (CLARITIN) 10 MG tablet Take 1 tablet (10 mg total) by mouth daily. 30 tablet 11   pantoprazole (PROTONIX) 40 MG tablet Take 1 tablet (40 mg total) by mouth daily. 30 tablet 3   ibuprofen (IBU) 600 MG tablet Take 1 tablet (600 mg total) by mouth 3 (three) times daily as needed. (Patient not taking: No sig reported) 30 tablet 0   predniSONE (DELTASONE) 10 MG tablet TAKE 3 TABLETS PO QD FOR 3 DAYS THEN TAKE 2 TABLETS PO QD FOR 3 DAYS THEN TAKE 1 TABLET PO QD FOR 3 DAYS THEN TAKE 1/2 TAB PO QD FOR 3 DAYS (Patient not taking: No sig reported) 20 tablet 0   promethazine-dextromethorphan (PROMETHAZINE-DM) 6.25-15 MG/5ML syrup Take 5 mLs by mouth 4 (four) times daily as needed. (Patient not taking: No sig reported) 118 mL 0      Drug Regimen Review Drug regimen was reviewed and remains appropriate with no significant issues identified   Home: Home Living Family/patient expects to be discharged to:: Private residence Living Arrangements: Alone Available Help at Discharge: Family, Available PRN/intermittently Type of Home: House Home Access: Stairs to enter [employer] of Steps: 2 Home Layout: Two level Alternate Level Stairs-Number of Steps: 10 Bathroom Shower/Tub: Multimedia programmer: Standard Bathroom Accessibility:  Yes Home Equipment: None Additional Comments: bed and bath are both on 2nd floor. 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 Much encouragement to self perform Transfers Overall transfer level: Needs assistance Equipment used: Rolling walker (2 wheels) Transfers: Sit to/from Stand Sit to Stand:  Min assist General transfer comment: light assist to rise and control descent Ambulation/Gait Ambulation/Gait assistance: Min guard, Min assist Gait Distance (Feet): 80 Feet Assistive device: Rolling walker (2 wheels) Gait Pattern/deviations: Step-through pattern, Decreased stride length, Trunk flexed General Gait Details: cues for turning, patient had decreased problem solve to turn with RW. Gait velocity: decreased   ADL: ADL Overall ADL's : Needs assistance/impaired Eating/Feeding: Modified independent, Sitting Grooming: Min guard, Wash/dry hands, Standing Grooming Details (indicate cue type and reason): sink level, decreased activity tolerance - reports sitting frequently Upper Body Bathing: Set up, Standing Upper Body Bathing Details (indicate cue type and reason): patient was quick to fatigue with activity tolerance of about 2.5 mins standing to complete bathing tasks with RW and education for safety and ECT. Lower Body Bathing: Sit to/from stand, Moderate assistance Lower Body Bathing Details (indicate cue type and reason): able to reach down to toes without problem Upper  Body Dressing : Set up, Sitting Upper Body Dressing Details (indicate cue type and reason): to don gown backwards like cape Lower Body Dressing: Moderate assistance, Sit to/from stand Lower Body Dressing Details (indicate cue type and reason): patient needed increased assistance to clear undergarments over bilateral feet with education to start with "bad" leg first. Toilet Transfer: Minimal assistance, Ambulation, Rolling walker (2 wheels) Toilet Transfer Details (indicate cue type and reason): patient needed physical assistance for sit to stand from edge of bed to make it to commode with min guard with RW once in standing to complete functional mobility to bathroom Toileting- Clothing Manipulation and Hygiene: Min guard, Sit to/from stand Tub/ Banker: Walk-in shower, Min guard, Ambulation, Rolling walker Functional mobility during ADLs: Minimal assistance, Min guard, Rolling walker General ADL Comments: patient was educated on ECT and ways to apply them to daily activities. patient verbalized understanding. patient was provided with encouragement to apply strategies to daily activities tomorrow to prioritize energy towards needed tasks v.s. wants. patient verbalized understanding   Cognition: Cognition Overall Cognitive Status: No family/caregiver present to determine baseline cognitive functioning Orientation Level: Oriented X4 Cognition Arousal/Alertness: Awake/alert Behavior During Therapy: Flat affect, WFL for tasks assessed/performed Overall Cognitive Status: No family/caregiver present to determine baseline cognitive functioning Area of Impairment: Problem solving Orientation Level: Time, Situation Problem Solving: Decreased initiation, Requires verbal cues General Comments: patient follows cues with increased time with patient noted to be flat during interactions with therapist and sister who was present at start of session   Physical Exam: Blood pressure 124/83, pulse 95,  temperature 98 F (36.7 C), temperature source Oral, resp. rate 20, height $RemoveBe'5\' 4"'wNAvbEmoA$  (1.626 m), weight 74.4 kg, SpO2 97 %. Physical Exam Vitals and nursing note reviewed.  Constitutional:      Comments: Sitting up in bed; eating lunch; appropriate; NAD;  HENT:     Head: Normocephalic and atraumatic.     Right Ear: External ear normal.     Left Ear: External ear normal.     Nose: Nose normal. No congestion.     Mouth/Throat:     Mouth: Mucous membranes are dry.     Pharynx: Oropharynx is clear. No oropharyngeal exudate.  Eyes:     General:        Right eye: No discharge.        Left eye: No discharge.     Extraocular Movements: Extraocular movements intact.  Cardiovascular:     Rate and Rhythm: Normal rate and regular rhythm.     Heart sounds: Normal heart sounds. No murmur  heard.   No gallop.  Pulmonary:     Comments: CTA B/L- no W/R/R- good air movement   Abdominal:     Comments: Soft, NT, ND, (+)BS; normoactive  Musculoskeletal:     Cervical back: Normal range of motion. No rigidity.     Comments: 4+/5 in arms proximal and distally B/L HF 4-/5; KE 4/5 DF/PF 4+/5 B/L  No swollen joints seen on exam  Skin:    Comments: Intact to light touch on exam B/L  Neurological:     Comments: Patient is alert.  Mood is a bit flat.  No specific complaints.  Makes eye contact with examiner.  Provides name age and place.  Follows full commands.   Light touch intact x all 4 extremities.   Psychiatric:        Mood and Affect: Mood normal.        Behavior: Behavior normal.      Lab Results Last 48 Hours  No results found for this or any previous visit (from the past 48 hour(s)).     Imaging Results (Last 48 hours)  No results found.           Medical Problem List and Plan: 1.  Debility secondary to generalized pain/rheumatoid arthritis.  Follow-up outpatient rheumatology services Dr Kathlene November.  Prednisone taper as indicated             -patient may  shower             -ELOS/Goals: 5-7  days mod I 2.  Antithrombotics: -DVT/anticoagulation:  Pharmaceutical: Lovenox             -antiplatelet therapy: n/a 3. Pain Management: Tramadol as needed 4. Mood: Wellbutrin 300 mg daily, Lexapro 20 mg daily             -antipsychotic agents: N/A 5. Neuropsych: This patient is capable of making decisions on her own behalf. 6. Skin/Wound Care: Routine skin checks 7. Fluids/Electrolytes/Nutrition: Routine in and outs with follow-up chemistries 8.  Hypertension.  Norvasc 5 mg daily, lisinopril 30 mg daily.  Monitor with increased mobility 9.  Elevated LFTs/transaminitis- as well as CK.  Improved.  Follow-up chemistries 10.  Hyperlipidemia.  Lipitor held due to elevated LFTs     I have personally performed a face to face diagnostic evaluation of this patient and formulated the key components of the plan.  Additionally, I have personally reviewed laboratory data, imaging studies, as well as relevant notes and concur with the physician assistant's documentation above.   The patient's status has not changed from the original H&P.  Any changes in documentation from the acute care chart have been noted above.  '   Cathlyn Parsons, PA-C 04/08/2021

## 2021-04-08 NOTE — Progress Notes (Signed)
Inpatient Rehab Admissions Coordinator:   I have a bed for this Pt. And can admit to CIR today. RN may call report to [phone].  Danney Bungert, MS, CCC-SLP Rehab Admissions Coordinator  [phone] (celll) [phone] (office)  

## 2021-04-08 NOTE — Discharge Instructions (Addendum)
Inpatient Rehab Discharge Instructions  Carrie [name] Discharge date and time: No discharge date for patient encounter.   Activities/Precautions/ Functional Status: Activity: activity as tolerated Diet: regular diet Wound Care: Routine skin checks Functional status:  ___ No restrictions     ___ Walk up steps independently ___ 24/7 supervision/assistance   ___ Walk up steps with assistance ___ Intermittent supervision/assistance  ___ Bathe/dress independently ___ Walk with walker     _x__ Bathe/dress with assistance ___ Walk Independently    ___ Shower independently ___ Walk with assistance    ___ Shower with assistance ___ No alcohol     ___ Return to work/school ________  Special Instructions:  No driving smoking or alcohol   Follow-up with PCP in regards to monitoring of blood pressure with Norvasc and lisinopril on hold   Call to reschedule prior neurology appointment before latest hospital admission   COMMUNITY REFERRALS UPON DISCHARGE:    Home Health:   PT OT & SW                Agency: [facility]   Phone: [phone]   Medical Equipment/Items Ordered: 3 IN 1                                                 Agency/Supplier:[facility]  [phone]  My questions have been answered and I understand these instructions. I will adhere to these goals and the provided educational materials after my discharge from the hospital.  Patient/Caregiver Signature _______________________________ Date __________  Clinician Signature _______________________________________ Date __________  Please bring this form and your medication list with you to all your follow-up doctor's appointments.

## 2021-04-09 LAB — COMPREHENSIVE METABOLIC PANEL
ALT: 50 U/L — ABNORMAL HIGH (ref 0–44)
AST: 60 U/L — ABNORMAL HIGH (ref 15–41)
Albumin: 2.6 g/dL — ABNORMAL LOW (ref 3.5–5.0)
Alkaline Phosphatase: 56 U/L (ref 38–126)
Anion gap: 7 (ref 5–15)
BUN: 16 mg/dL (ref 8–23)
CO2: 20 mmol/L — ABNORMAL LOW (ref 22–32)
Calcium: 9 mg/dL (ref 8.9–10.3)
Chloride: 109 mmol/L (ref 98–111)
Creatinine, Ser: 1.01 mg/dL — ABNORMAL HIGH (ref 0.44–1.00)
GFR, Estimated: >60 mL/min (ref >60)
Glucose, Bld: 87 mg/dL (ref 70–99)
Potassium: 3.5 mmol/L (ref 3.5–5.1)
Sodium: 136 mmol/L (ref 135–145)
Total Bilirubin: 0.8 mg/dL (ref 0.3–1.2)
Total Protein: 6.3 g/dL — ABNORMAL LOW (ref 6.5–8.1)

## 2021-04-09 LAB — CBC WITH DIFFERENTIAL/PLATELET
Abs Immature Granulocytes: 0.03 10*3/uL (ref 0.00–0.07)
Basophils Absolute: 0 10*3/uL (ref 0.0–0.1)
Basophils Relative: 0 %
Eosinophils Absolute: 0 10*3/uL (ref 0.0–0.5)
Eosinophils Relative: 0 %
HCT: 37.2 % (ref 36.0–46.0)
Hemoglobin: 12.7 g/dL (ref 12.0–15.0)
Immature Granulocytes: 1 %
Lymphocytes Relative: 12 %
Lymphs Abs: 0.7 10*3/uL (ref 0.7–4.0)
MCH: 30.9 pg (ref 26.0–34.0)
MCHC: 34.1 g/dL (ref 30.0–36.0)
MCV: 90.5 fL (ref 80.0–100.0)
Monocytes Absolute: 0.4 10*3/uL (ref 0.1–1.0)
Monocytes Relative: 7 %
Neutro Abs: 4.3 10*3/uL (ref 1.7–7.7)
Neutrophils Relative %: 80 %
Platelets: 255 10*3/uL (ref 150–400)
RBC: 4.11 MIL/uL (ref 3.87–5.11)
RDW: 14.7 % (ref 11.5–15.5)
WBC: 5.3 10*3/uL (ref 4.0–10.5)
nRBC: 0 % (ref 0.0–0.2)

## 2021-04-09 NOTE — Progress Notes (Signed)
PROGRESS NOTE   Subjective/Complaints:   Pt reports poor appetite and poor energy- ate cereal- that's it.  Doesn't feel depressed, just exhausted.  LBM last night.    ROS:  Pt denies SOB, abd pain, CP, N/V/C/D, and vision changes   Objective:   No results found. Recent Labs    04/09/21 0620  WBC 5.3  HGB 12.7  HCT 37.2  PLT 255   Recent Labs    04/09/21 0620  NA 136  K 3.5  CL 109  CO2 20*  GLUCOSE 87  BUN 16  CREATININE 1.01*  CALCIUM 9.0    Intake/Output Summary (Last 24 hours) at 04/09/2021 1435 Last data filed at 04/09/2021 0900 Gross per 24 hour  Intake 360 ml  Output --  Net 360 ml        Physical Exam: Vital Signs Blood pressure 102/74, pulse 100, temperature 97.7 F (36.5 C), resp. rate 17, height 5\' 5"  (1.651 m), weight 72.5 kg, SpO2 100 %.   General: awake, alert, appropriate, laying in bed; poor energy; ~25% of breakfast eaten; NAD HENT: conjugate gaze; oropharynx moist CV: regular rate; no JVD Pulmonary: CTA B/L; no W/R/R- good air movement GI: soft, NT, ND, (+)BS Psychiatric: appropriate- flat affect/poor energy level, however not sure of depressed? Pt denies Neurological: Ox3 Musculoskeletal:     Cervical back: Normal range of motion. No rigidity.     Comments: 4+/5 in arms proximal and distally B/L HF 4-/5; KE 4/5 DF/PF 4+/5 B/L  No swollen joints seen on exam  Skin:    Comments: Intact to light touch on exam B/L  Neurological:     Comments: Patient is alert.  Mood is a bit flat.  Light touch intact x all 4 extremities.    Assessment/Plan: 1. Functional deficits which require 3+ hours per day of interdisciplinary therapy in a comprehensive inpatient rehab setting. Physiatrist is providing close team supervision and 24 hour management of active medical problems listed below. Physiatrist and rehab team continue to assess barriers to discharge/monitor patient progress toward  functional and medical goals  Care Tool:  Bathing    Body parts bathed by patient: Right arm, Left arm, Chest, Abdomen, Face, Right upper leg, Left upper leg   Body parts bathed by helper: Right lower leg, Left lower leg     Bathing assist Assist Level: Minimal Assistance - Patient > 75%     Upper Body Dressing/Undressing Upper body dressing   What is the patient wearing?: Pull over shirt    Upper body assist Assist Level: Supervision/Verbal cueing    Lower Body Dressing/Undressing Lower body dressing      What is the patient wearing?: Pants     Lower body assist Assist for lower body dressing: Minimal Assistance - Patient > 75%     Toileting Toileting    Toileting assist Assist for toileting: Minimal Assistance - Patient > 75%     Transfers Chair/bed transfer  Transfers assist     Chair/bed transfer assist level: Minimal Assistance - Patient > 75%     Locomotion Ambulation   Ambulation assist      Assist level: Minimal Assistance - Patient > 75% Assistive  device: Walker-rolling Max distance: 25   Walk 10 feet activity   Assist     Assist level: Minimal Assistance - Patient > 75% Assistive device: Walker-rolling   Walk 50 feet activity   Assist Walk 50 feet with 2 turns activity did not occur: Safety/medical concerns         Walk 150 feet activity   Assist Walk 150 feet activity did not occur: Safety/medical concerns         Walk 10 feet on uneven surface  activity   Assist Walk 10 feet on uneven surfaces activity did not occur: Safety/medical concerns         Wheelchair     Assist Is the patient using a wheelchair?: Yes Type of Wheelchair: Manual    Wheelchair assist level: Total Assistance - Patient < 25% Max wheelchair distance: 150    Wheelchair 50 feet with 2 turns activity    Assist        Assist Level: Total Assistance - Patient < 25%   Wheelchair 150 feet activity     Assist       Assist Level: Total Assistance - Patient < 25%   Blood pressure 102/74, pulse 100, temperature 97.7 F (36.5 C), resp. rate 17, height 5\' 5"  (1.651 m), weight 72.5 kg, SpO2 100 %.    Medical Problem List and Plan: 1.  Debility secondary to generalized pain/rheumatoid arthritis.  Follow-up outpatient rheumatology services Dr .  Prednisone taper as indicated             -patient may  shower             -ELOS/Goals: 5-7 days mod I First day of evaluations- PT and OT- explained to pt coming off Prednisone as well as new dx of RA can cause poor energy levels 2.  Antithrombotics: -DVT/anticoagulation:  Pharmaceutical: Lovenox             -antiplatelet therapy: n/a 3. Pain Management: Tramadol as needed  11/2- pain controlled- con't regimen- monitor as prednisone tapers 4. Mood: Wellbutrin 300 mg daily, Lexapro 20 mg daily             -antipsychotic agents: N/A 5. Neuropsych: This patient is capable of making decisions on her own behalf. 6. Skin/Wound Care: Routine skin checks 7. Fluids/Electrolytes/Nutrition: Routine in and outs with follow-up chemistries 8.  Hypertension.  Norvasc 5 mg daily, lisinopril 30 mg daily.  Monitor with increased mobility  11/2- BP soft this AM- no Sx's of orthostatic hypotension- con't regimen and monitor 9.  Elevated LFTs/transaminitis- as well as CK.  Improved.  Follow-up chemistries  11/2- coming down- is 60/50 now- 10/24 was 150/135- so improving- con't to monitor 10.  Hyperlipidemia.  Lipitor held due to elevated LFTs       LOS: 1 days A FACE TO FACE EVALUATION WAS PERFORMED  Thom Ollinger 04/09/2021, 2:35 PM

## 2021-04-09 NOTE — Evaluation (Signed)
Occupational Therapy Assessment and Plan  Patient Details  Name: Carrie [name] MRN: [id number] Date of Birth: [birth year]  OT Diagnosis: cognitive deficits, muscle weakness (generalized), and decreased Watson, functional strength, fatigue, decreased dynamic standing balance impairing ADL/IADL/mobility performance Rehab Potential: Rehab Potential (ACUTE ONLY): Fair ELOS: 1.5 - 2 weeks   Today's Date: 04/09/2021 OT Individual Time: 9509-3267 OT Individual Time Calculation (min): 54 min     Hospital Problem: Principal Problem:   Debility Active Problems:   Rheumatoid arthritis flare ([place])   Fall (on) (from) other stairs and steps, subsequent encounter   Past Medical History:  Past Medical History:  Diagnosis Date   Hyperlipidemia    Hypertension    Past Surgical History:  Past Surgical History:  Procedure Laterality Date   ABDOMINAL HYSTERECTOMY     REDUCTION MAMMAPLASTY      Assessment & Plan Clinical Impression: Patient is a 60 y.o. year old female with history of hypertension, hyperlipidemia and depression, quit smoking 7 years ago.  Per chart review patient lives alone.  Two-level home 10 steps to entry.  Bed and bath on second level.  Independent prior to admission.  Presented to [facility]/26/[facility]21 with recent fall 3 weeks ago after missing a step without loss of consciousness.  She also reported some generalized weakness and lightheadedness and dizziness on standing.  By report she had recently gone to the [facility] urgent care about a week ago received Flexeril and ibuprofen.  Admission chemistries unremarkable except creatinine 1.03, AST 161, ALT 114, troponin negative, CK 638, lactic acid 1.8, TSH within normal limits, vitamin B12 7158, urinalysis negative nitrite, urine drug screen negative.  Cranial CT scan negative.  CT cervical spine unremarkable.  X-rays of hip and pelvis no acute osseous injury.  Echocardiogram with ejection fraction of 60 to 65% no wall  motion abnormalities grade 1 diastolic dysfunction.  Ultrasound of the abdomen gallbladder distended by sludge no gross hepatic mass.  Work-up for possible rheumatoid arthritis positive ANA negative CCP mildly elevated ESR 26 and CRP 2.5 as well as elevated RA(101.4).  Outpatient rheumatology Dr.Aryal curbside consulted recommend treatment with prednisone and taper.  CK improved to 226 as well as improved LFTs..  Subcutaneous Lovenox for DVT prophylaxis.  Tolerating a regular diet.  Therapy evaluations completed due to patient decreased functional mobility was admitted for a comprehensive rehab program.Patient transferred to CIR on 04/08/2021 .    Patient currently requires min with basic self-care skills secondary to muscle weakness, decreased cardiorespiratoy endurance, decreased problem solving, decreased memory, and delayed processing, and decreased standing balance and decreased balance strategies.  Prior to hospitalization, patient could complete BADL/IADL/functional transfers with independent .  Patient will benefit from skilled intervention to decrease level of assist with basic self-care skills, increase independence with basic self-care skills, and increase level of independence with iADL prior to discharge home independently.  Anticipate patient will require and follow up outpatient.  OT - End of Session Activity Tolerance: Tolerates < 10 min activity, no significant change in vital signs Endurance Deficit: Yes Endurance Deficit Description: reports 8/10 on modified RPE with minimal activity OT Assessment Rehab Potential (ACUTE ONLY): Fair OT Barriers to Discharge: Decreased caregiver support OT Patient demonstrates impairments in the following area(s): Balance;Skin Integrity;Cognition;Endurance;Pain;Motor OT Basic ADL's Functional Problem(s): Eating;Grooming;Bathing;Dressing;Toileting OT Advanced ADL's Functional Problem(s): Simple Meal Preparation;Light Housekeeping OT Transfers  Functional Problem(s): Toilet;Tub/Shower OT Additional Impairment(s): Fuctional Use of Upper Extremity OT Plan OT Intensity: Minimum of 1-2 x/day, 45 to 90 minutes  OT Frequency: 5 out of 7 days OT Duration/Estimated Length of Stay: 1.5 - 2 weeks OT Treatment/Interventions: Balance/vestibular training;Community reintegration;Disease mangement/prevention;Neuromuscular re-education;Patient/family education;Self Care/advanced ADL retraining;Therapeutic Exercise;UE/LE Coordination activities;Wheelchair propulsion/positioning;UE/LE Strength taining/ROM;Therapeutic Activities;Psychosocial support;Pain management;Functional mobility training;DME/adaptive equipment instruction;Discharge planning;Cognitive remediation/compensation OT Self Feeding Anticipated Outcome(s): mod I OT Basic Self-Care Anticipated Outcome(s): mod I OT Toileting Anticipated Outcome(s): mod I OT Bathroom Transfers Anticipated Outcome(s): mod I toilet, S shower OT Recommendation Patient destination: Home Follow Up Recommendations: Home health OT;Outpatient OT Equipment Recommended: To be determined   OT Evaluation Precautions/Restrictions  Precautions Precautions: Fall Precaution Comments: fatigues quickly Restrictions Weight Bearing Restrictions: No  Pain Pain Assessment Pain Scale: 0-10 Pain Score: 5  Pain Type: Acute pain Pain Location: Rib cage Pain Orientation: Right;Left Pain Descriptors / Indicators: Aching Pain Frequency: Constant Pain Onset: On-going Patients Stated Pain Goal: 4 Pain Intervention(s): Medication (See eMAR) Home Living/Prior [place] expects to be discharged to:: Private residence Living Arrangements: Alone Available Help at Discharge: Family, Friend(s), Available PRN/intermittently Type of Home: House Home Access: Stairs to enter Technical brewer of Steps: 2 Entrance Stairs-Rails: Right, Left, Can reach both Home Layout: Two level Alternate Level  Stairs-Number of Steps: 10 Alternate Level Stairs-Rails: Right Bathroom Shower/Tub: Multimedia programmer: Standard Bathroom Accessibility: Yes Additional Comments: bed and bath are both on 2nd floor. 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 Prior Function Level of Independence: Independent with transfers, Independent with basic ADLs, Independent with homemaking with ambulation, Independent with gait  Able to Take Stairs?: Yes Driving: Yes Vision Baseline Vision/History: 1 Wears glasses Ability to See in Adequate Light: 0 Adequate Patient Visual Report: No change from baseline Vision Assessment?: No apparent visual deficits Perception  Perception: Within Functional Limits Praxis Praxis: Intact Cognition Overall Cognitive Status: No family/caregiver present to determine baseline cognitive functioning Arousal/Alertness: Awake/alert Orientation Level: Person;Place;Situation Person: Oriented Place: Oriented Situation: Oriented Year: 2022 Month: October Day of Week: Correct Memory: Impaired Memory Impairment: Decreased recall of new information Immediate Memory Recall: Sock;Blue;Bed Memory Recall Sock: Not able to recall Memory Recall Blue: Without Cue Memory Recall Bed: With Cue Awareness: Appears intact Problem Solving: Impaired Safety/Judgment: Appears intact Comments: Requires intermittent cues for sequencing bed mobility and donning socks. Sensation Sensation Light Touch: Appears Intact Hot/Cold: Not tested Proprioception: Appears Intact Stereognosis: Appears Intact Coordination Gross  Motor Movements are Fluid and Coordinated: No Fine Motor Movements are Fluid and Coordinated: No Finger Nose Finger Test: very effortful and limited ROM B with reports of "cramping" in L shoulder and mild dysmetria B 9 Hole Peg Test: R: 36 secs, L: 42 secs Motor  Motor Motor: Abnormal postural alignment and control;Other (comment) Motor - Skilled Clinical Observations: generalized weakness, stiffness, mild B dysmetria  Trunk/Postural Assessment  Cervical Assessment Cervical Assessment: Within Functional Limits Thoracic Assessment Thoracic Assessment: Exceptions to [facility] (rounded shoulders) Lumbar Assessment Lumbar Assessment: Exceptions to [facility] (posterior pelvic tilt) Postural Control Postural Control: Deficits on evaluation  Balance Balance Balance Assessed: Yes Static Sitting Balance Static Sitting - Balance Support: Feet supported Static Sitting - Level of Assistance: 5: Stand by assistance Dynamic Sitting Balance Dynamic Sitting - Balance Support: During functional activity;Feet supported Dynamic Sitting - Level of Assistance: 5: Stand by assistance Static Standing  Balance Static Standing - Balance Support: Bilateral upper extremity supported Static Standing - Level of Assistance: 4: Min assist Dynamic Standing Balance Dynamic Standing - Balance Support: Bilateral upper extremity supported;During functional activity Dynamic Standing - Level of Assistance: 4: Min assist Extremity/Trunk Assessment RUE Assessment RUE Assessment: Exceptions to [facility] Active Range of Motion (AROM) Comments: WNL General Strength Comments: 3/5 in shoulder flexion, 9 lb grip strength; able to complete 17 reps for arm curl test LUE Assessment LUE Assessment: Exceptions to [facility] Active Range of Motion (AROM) Comments: WNL General Strength Comments: 3/5 in shoulder flexion, 1 lb grip strength, unable to complete 1 rep during arm curl test  Care Tool Care Tool Self Care Eating   Eating Assist Level: Set  up assist    Oral Care    Oral Care Assist Level: Contact Guard/Toucning assist    Bathing   Body parts bathed by patient: Right arm;Left arm;Chest;Abdomen;Face;Right upper leg;Left upper leg Body parts bathed by helper: Right lower leg;Left lower leg   Assist Level: Minimal Assistance - Patient > 75%    Upper Body Dressing(including orthotics)   What is the patient wearing?: Pull over shirt   Assist Level: Supervision/Verbal cueing    Lower Body Dressing (excluding footwear)   What is the patient wearing?: Pants Assist for lower body dressing: Minimal Assistance - Patient > 75%    Putting on/Taking off footwear   What is the patient wearing?: Non-skid slipper socks Assist for footwear: Total Assistance - Patient < 25%       Care Tool Toileting Toileting activity   Assist for toileting: Minimal Assistance - Patient > 75%     Care Tool Bed Mobility Roll left and right activity   Roll left and right assist level: Contact Guard/Touching assist    Sit to lying activity   Sit to lying assist level: Minimal Assistance - Patient > 75%    Lying to sitting on side of bed activity   Lying to sitting on side of bed assist level: the ability to move from lying on the back to sitting on the side of the bed with no back support.: Minimal Assistance - Patient > 75%     Care Tool Transfers Sit to stand transfer   Sit to stand assist level: Minimal Assistance - Patient > 75%    Chair/bed transfer   Chair/bed transfer assist level: Minimal Assistance - Patient > 75%     Toilet transfer   Assist Level: Minimal Assistance - Patient > 75%     Care Tool Cognition  Expression of Ideas and Wants Expression of Ideas and Wants: 3. Some difficulty - exhibits some difficulty with expressing needs and ideas (e.g, some words or finishing thoughts) or speech is not clear  Understanding Verbal and Non-Verbal Content Understanding Verbal and Non-Verbal Content: 3. Usually understands -  understands most conversations, but misses some part/intent of message. Requires cues at times to understand   Memory/Recall Ability Memory/Recall Ability : Current season;Location of own room;That he or she is in a hospital/hospital unit   Refer to Care Plan for [place] 1 OT Short Term Goal 1 (Week 1): Pt will indepedently verbalize at least 2 energy conservation strategies to implement into ADL. OT Short Term Goal 2 (Week 1): Pt will don pants with CGA and AE PRN. OT Short Term Goal 3 (Week 1): Pt will complete shower transfer with CGA and LRAD.  Recommendations for other services: Therapeutic Recreation  Pet  therapy, Kitchen group, Stress management, and Outing/community reintegration   Skilled Therapeutic Intervention ADL ADL Eating: Set up Where Assessed-Eating: Wheelchair Grooming: Supervision/safety Where Assessed-Grooming: Sitting at sink Upper Body Bathing: Supervision/safety Where Assessed-Upper Body Bathing: Sitting at sink Lower Body Bathing: Minimal assistance Where Assessed-Lower Body Bathing: Standing at sink Upper Body Dressing: Supervision/safety Where Assessed-Upper Body Dressing: Sitting at sink Lower Body Dressing: Minimal assistance Where Assessed-Lower Body Dressing: Edge of bed Toileting: Minimal assistance Where Assessed-Toileting: Glass blower/designer: Minimal Print production planner Method: Counselling psychologist: Grab bars;Raised toilet seat Tub/Shower Transfer: Not assessed Social research officer, government: Not assessed Mobility  Bed Mobility Bed Mobility: Supine to Sit;Sit to Supine Supine to Sit: Minimal Assistance - Patient > 75% Sit to Supine: Minimal Assistance - Patient > 75% Transfers Sit to Stand: Minimal Assistance - Patient > 75% Stand to Sit: Minimal Assistance - Patient > 75%  Session Note: Pt received semi-reclined in bed, endorses fatigue, but agreeable to OT eval. Reviewed role of CIR OT,  evaluation process, ADL/func mobility retraining, goals for therapy, and safety plan. Evaluation completed as documented above. ADL needs already, pt agreeable to simulate LBD. Came to sitting EOB with min A to lift trunk and cues for sequencing transfer. Donned oversized scrub pants with min A to thread LLE and to power up into standing from low bed surface. Total A to don B gripper socks. Short ambulatory transfer throughout session with min A to power up and to promote anterior WS/prevent posterior LOB. Reports 8/10 on modified RPE post transfer. Administered 9HPT, arm curl test, and assessed B grip strengths with results documented above. C/o lateral trunk pain, RN notified.  Pt left semi-reclined in bed with bed alarm engaged, call bell in reach, and all immediate needs met.   Discharge Criteria: Patient will be discharged from OT if patient refuses treatment 3 consecutive times without medical reason, if treatment goals not met, if there is a change in medical status, if patient makes no progress towards goals or if patient is discharged from hospital.  The above assessment, treatment plan, treatment alternatives and goals were discussed and mutually agreed upon: by patient  Volanda Napoleon MS, OTR/L  04/09/2021, 12:41 PM

## 2021-04-09 NOTE — Progress Notes (Signed)
Inpatient Rehabilitation  Patient information reviewed and entered into eRehab system by Liddy Deam Cotton Beckley, OTR/L.   Information including medical coding, functional ability and quality indicators will be reviewed and updated through discharge.    

## 2021-04-09 NOTE — Progress Notes (Signed)
Occupational Therapy Session Note  Patient Details  Name: Carrie [name] MRN: [id number] Date of Birth: 1961/04/21  Today's Date: 04/09/2021 OT Individual Time: 1500-1540 OT Individual Time Calculation (min): 40 min    Short Term Goals: Week 1:  OT Short Term Goal 1 (Week 1): Pt will indepedently verbalize at least 2 energy conservation strategies to implement into ADL. OT Short Term Goal 2 (Week 1): Pt will don pants with CGA and AE PRN. OT Short Term Goal 3 (Week 1): Pt will complete shower transfer with CGA and LRAD.  Skilled Therapeutic Interventions/Progress Updates:  Pt greeted at time of session fatigued and initially feeling too tired to participate in OT session but with encouragement, agreeable to in room there ex. When retrieved materials and brought to room, pt needing to use bathroom, supine > sit Min A and ambulated to bathroom Min/CGA, transferred to toilet same manner. Min A for clothing management but able to perform hygiene with CGA in standing before walking to sink level. Seated oral hygiene with set up and changing shirt same manner. Walked short distance to bed and sit > supine Mod A for LE management. Scoot up in bed Mod A. Alarm on call bell in reach.     Therapy Documentation Precautions:  Precautions Precautions: Fall Precaution Comments: fatigues quickly Restrictions Weight Bearing Restrictions: No     Therapy/Group: Individual Therapy  Erasmo Score 04/09/2021, 3:39 PM

## 2021-04-09 NOTE — Plan of Care (Signed)
  Problem: RH Balance Goal: LTG Patient will maintain dynamic standing balance (PT) Description: LTG:  Patient will maintain dynamic standing balance with assistance during mobility activities (PT) Flowsheets (Taken 04/09/2021 1200) LTG: Pt will maintain dynamic standing balance during mobility activities with:: Supervision/Verbal cueing   Problem: Sit to Stand Goal: LTG:  Patient will perform sit to stand with assistance level (PT) Description: LTG:  Patient will perform sit to stand with assistance level (PT) Flowsheets (Taken 04/09/2021 1200) LTG: PT will perform sit to stand in preparation for functional mobility with assistance level: Supervision/Verbal cueing   Problem: RH Bed Mobility Goal: LTG Patient will perform bed mobility with assist (PT) Description: LTG: Patient will perform bed mobility with assistance, with/without cues (PT). Flowsheets (Taken 04/09/2021 1200) LTG: Pt will perform bed mobility with assistance level of: Independent   Problem: RH Bed to Chair Transfers Goal: LTG Patient will perform bed/chair transfers w/assist (PT) Description: LTG: Patient will perform bed to chair transfers with assistance (PT). Flowsheets (Taken 04/09/2021 1200) LTG: Pt will perform Bed to Chair Transfers with assistance level: Supervision/Verbal cueing   Problem: RH Car Transfers Goal: LTG Patient will perform car transfers with assist (PT) Description: LTG: Patient will perform car transfers with assistance (PT). Flowsheets (Taken 04/09/2021 1200) LTG: Pt will perform car transfers with assist:: Supervision/Verbal cueing   Problem: RH Ambulation Goal: LTG Patient will ambulate in controlled environment (PT) Description: LTG: Patient will ambulate in a controlled environment, # of feet with assistance (PT). Flowsheets (Taken 04/09/2021 1200) LTG: Pt will ambulate in controlled environ  assist needed:: Supervision/Verbal cueing LTG: Ambulation distance in controlled environment:  150 Goal: LTG Patient will ambulate in home environment (PT) Description: LTG: Patient will ambulate in home environment, # of feet with assistance (PT). Flowsheets (Taken 04/09/2021 1200) LTG: Pt will ambulate in home environ  assist needed:: Supervision/Verbal cueing LTG: Ambulation distance in home environment: 50   Problem: RH Wheelchair Mobility Goal: LTG Patient will propel w/c in controlled environment (PT) Description: LTG: Patient will propel wheelchair in controlled environment, # of feet with assist (PT) Flowsheets (Taken 04/09/2021 1200) LTG: Pt will propel w/c in controlled environ  assist needed:: Independent with assistive device LTG: Propel w/c distance in controlled environment: 150 Goal: LTG Patient will propel w/c in home environment (PT) Description: LTG: Patient will propel wheelchair in home environment, # of feet with assistance (PT). Flowsheets (Taken 04/09/2021 1200) LTG: Pt will propel w/c in home environ  assist needed:: Independent with assistive device LTG: Propel w/c distance in home environment: 50   Problem: RH Stairs Goal: LTG Patient will ambulate up and down stairs w/assist (PT) Description: LTG: Patient will ambulate up and down # of stairs with assistance (PT) Flowsheets (Taken 04/09/2021 1200) LTG: Pt will ambulate up/down stairs assist needed:: Supervision/Verbal cueing LTG: Pt will  ambulate up and down number of stairs: 10 Note: R HR per in home set up

## 2021-04-09 NOTE — Plan of Care (Signed)
Problem: RH Balance Goal: LTG Patient will maintain dynamic standing with ADLs (OT) Description: LTG:  Patient will maintain dynamic standing balance with assist during activities of daily living (OT)  Flowsheets (Taken 04/09/2021 1425) LTG: Pt will maintain dynamic standing balance during ADLs with: Independent with assistive device   Problem: Sit to Stand Goal: LTG:  Patient will perform sit to stand in prep for activites of daily living with assistance level (OT) Description: LTG:  Patient will perform sit to stand in prep for activites of daily living with assistance level (OT) Flowsheets (Taken 04/09/2021 1425) LTG: PT will perform sit to stand in prep for activites of daily living with assistance level: Independent with assistive device   Problem: RH Eating Goal: LTG Patient will perform eating w/assist, cues/equip (OT) Description: LTG: Patient will perform eating with assist, with/without cues using equipment (OT) Flowsheets (Taken 04/09/2021 1425) LTG: Pt will perform eating with assistance level of: Independent with assistive device    Problem: RH Grooming Goal: LTG Patient will perform grooming w/assist,cues/equip (OT) Description: LTG: Patient will perform grooming with assist, with/without cues using equipment (OT) Flowsheets (Taken 04/09/2021 1425) LTG: Pt will perform grooming with assistance level of: Independent with assistive device    Problem: RH Bathing Goal: LTG Patient will bathe all body parts with assist levels (OT) Description: LTG: Patient will bathe all body parts with assist levels (OT) Flowsheets (Taken 04/09/2021 1425) LTG: Pt will perform bathing with assistance level/cueing: Independent with assistive device    Problem: RH Dressing Goal: LTG Patient will perform upper body dressing (OT) Description: LTG Patient will perform upper body dressing with assist, with/without cues (OT). Flowsheets (Taken 04/09/2021 1425) LTG: Pt will perform upper body dressing  with assistance level of: Independent Goal: LTG Patient will perform lower body dressing w/assist (OT) Description: LTG: Patient will perform lower body dressing with assist, with/without cues in positioning using equipment (OT) Flowsheets (Taken 04/09/2021 1425) LTG: Pt will perform lower body dressing with assistance level of: Independent with assistive device   Problem: RH Toileting Goal: LTG Patient will perform toileting task (3/3 steps) with assistance level (OT) Description: LTG: Patient will perform toileting task (3/3 steps) with assistance level (OT)  Flowsheets (Taken 04/09/2021 1425) LTG: Pt will perform toileting task (3/3 steps) with assistance level: Independent with assistive device   Problem: RH Functional Use of Upper Extremity Goal: LTG Patient will use RT/LT upper extremity as a (OT) Description: LTG: Patient will use right/left upper extremity as a stabilizer/gross assist/diminished/nondominant/dominant level with assist, with/without cues during functional activity (OT) Flowsheets (Taken 04/09/2021 1425) LTG: Use of upper extremity in functional activities: LUE as nondominant level LTG: Pt will use upper extremity in functional activity with assistance level of: Independent with assistive device   Problem: RH Simple Meal Prep Goal: LTG Patient will perform simple meal prep w/assist (OT) Description: LTG: Patient will perform simple meal prep with assistance, with/without cues (OT). Flowsheets (Taken 04/09/2021 1425) LTG: Pt will perform simple meal prep with assistance level of: Supervision/Verbal cueing   Problem: RH Light Housekeeping Goal: LTG Patient will perform light housekeeping w/assist (OT) Description: LTG: Patient will perform light housekeeping with assistance, with/without cues (OT). Flowsheets (Taken 04/09/2021 1425) LTG: Pt will perform light housekeeping with assistance level of: Supervision/Verbal cueing   Problem: RH Toilet Transfers Goal: LTG  Patient will perform toilet transfers w/assist (OT) Description: LTG: Patient will perform toilet transfers with assist, with/without cues using equipment (OT) Flowsheets (Taken 04/09/2021 1425) LTG: Pt will perform toilet transfers  with assistance level of: Independent with assistive device   Problem: RH Tub/Shower Transfers Goal: LTG Patient will perform tub/shower transfers w/assist (OT) Description: LTG: Patient will perform tub/shower transfers with assist, with/without cues using equipment (OT) Flowsheets (Taken 04/09/2021 1425) LTG: Pt will perform tub/shower stall transfers with assistance level of: Supervision/Verbal cueing

## 2021-04-09 NOTE — Progress Notes (Signed)
Physical Therapy Session Note  Patient Details  Name: Carrie [name] MRN: [id number] Date of Birth: 1961-05-11  Today's Date: 04/09/2021 PT Individual Time: 1305-1350 PT Individual Time Calculation (min): 45 min   Short Term Goals: Week 1:  PT Short Term Goal 1 (Week 1): Patient will complete bed mobility with CGA consistently PT Short Term Goal 2 (Week 1): Patient will transfer bed <> wc with LRAD and CGA consistently PT Short Term Goal 3 (Week 1): Patient will ambulate >46ft with LRAD and CGA  Skilled Therapeutic Interventions/Progress Updates: Pt presented in bed agreeable to therapy. Pt denies pain just fatigue. Session focused on functional mobility activities as per Care Tool. PTA noted that pt had not eaten lunch, explained to pt importance of nutrition especially in CIR setting as will be performing more activities than in acute and needs sustenance for energy. Pt verbalized understanding. Pt performed supine to sit with minA and use of bed features with increased time. Pt was able to perform anterior scoot to EOB but required increased time and effort. Performed stand pivot transfer to w/c with minA for Sit to stand and verbal cues for increasing anterior weight shift. PTA noted mild anterior lean and L forefoot off ground at initial stand. Pt transported to ortho gym for time management and performed car transfer with minA. Pt required rest break while sitting in car due to fatigue. Pt then transferred to rehab gym and was able to ascend/descend x 3 stairs (pt felt unable to complete 4th step) with minA, using B rails and step to pattern wit heavy use of BUE. Pt also ambulated on compliant surface x 10 ft with light minA and demonstrating safe negotiation of RW. Pt transported back to room  and performed ambulatory transfer to bed with overall CGA. Performed sit to supine with minA and was able to scoot to [facility] via bridge with minA. Pt left in bed at end of session with bed alarm on, call bell  within reach and needs met.      Therapy Documentation Precautions:  Precautions Precautions: Fall Precaution Comments: fatigues quickly Restrictions Weight Bearing Restrictions: No General:   Vital Signs: Therapy Vitals Temp: 97.7 F (36.5 C) Pulse Rate: 100 Resp: 17 BP: 102/74 Patient Position (if appropriate): Lying Oxygen Therapy SpO2: 100 % O2 Device: Room Air Pain:   Mobility: Bed Mobility Bed Mobility: Supine to Sit;Sit to Supine Supine to Sit: Minimal Assistance - Patient > 75% Sit to Supine: Minimal Assistance - Patient > 75% Transfers Transfers: Sit to Stand;Stand to Sit;Stand Pivot Transfers Sit to Stand: Minimal Assistance - Patient > 75% Stand to Sit: Minimal Assistance - Patient > 75% Stand Pivot Transfers: Minimal Assistance - Patient > 75% Stand Pivot Transfer Details: Verbal cues for safe use of DME/AE;Verbal cues for precautions/safety Transfer (Assistive device): Rolling walker Locomotion :    Trunk/Postural Assessment : Cervical Assessment Cervical Assessment: Within Functional Limits Thoracic Assessment Thoracic Assessment: Exceptions to [facility] (rounded shoulders) Lumbar Assessment Lumbar Assessment: Exceptions to [facility] (posterior pelvic tilt) Postural Control Postural Control: Deficits on evaluation  Balance: Balance Balance Assessed: Yes Static Sitting Balance Static Sitting - Balance Support: Feet supported Static Sitting - Level of Assistance: 5: Stand by assistance Dynamic Sitting Balance Dynamic Sitting - Balance Support: During functional activity;Feet supported Dynamic Sitting - Level of Assistance: 5: Stand by assistance Static Standing Balance Static Standing - Balance Support: Bilateral upper extremity supported Static Standing - Level of Assistance: 4: Min assist Dynamic Standing Balance Dynamic Standing -  Balance Support: Bilateral upper extremity supported;During functional activity Dynamic Standing - Level of Assistance: 4:  Min assist Exercises:   Other Treatments:      Therapy/Group: Individual Therapy  Jaycen Vercher 04/09/2021, 4:04 PM

## 2021-04-09 NOTE — Evaluation (Signed)
Physical Therapy Assessment and Plan  Patient Details  Name: Carrie [name] MRN: [id number] Date of Birth: [birth year]  PT Diagnosis: Abnormal posture, Abnormality of gait, Cognitive deficits, Difficulty walking, and Muscle weakness Rehab Potential: Good ELOS: 1.5-2 weeks   Today's Date: 04/09/2021 PT Individual Time: 0801-0900 PT Individual Time Calculation (min): 59 min    Hospital Problem: Principal Problem:   Debility Active Problems:   Rheumatoid arthritis flare ([place])   Fall (on) (from) other stairs and steps, subsequent encounter   Past Medical History:  Past Medical History:  Diagnosis Date   Hyperlipidemia    Hypertension    Past Surgical History:  Past Surgical History:  Procedure Laterality Date   ABDOMINAL HYSTERECTOMY     REDUCTION MAMMAPLASTY      Assessment & Plan Clinical Impression: Patient is a 60 y.o. year old female with  history of hypertension, hyperlipidemia and depression, quit smoking 7 years ago.  Per chart review patient lives alone.  Two-level home 10 steps to entry.  Bed and bath on second level.  Independent prior to admission.  Presented to [facility] 03/26/2021 with recent fall 3 weeks ago after missing a step without loss of consciousness.  She also reported some generalized weakness and lightheadedness and dizziness on standing.  By report she had recently gone to the [place] urgent care about a week ago received Flexeril and ibuprofen.  Admission chemistries unremarkable except creatinine 1.03, AST 161, ALT 114, troponin negative, CK 638, lactic acid 1.8, TSH within normal limits, vitamin B12 7158, urinalysis negative nitrite, urine drug screen negative.  Cranial CT scan negative.  CT cervical spine unremarkable.  X-rays of hip and pelvis no acute osseous injury.  Echocardiogram with ejection fraction of 60 to 65% no wall motion abnormalities grade 1 diastolic dysfunction.  Ultrasound of the abdomen gallbladder distended by sludge no  gross hepatic mass.  Work-up for possible rheumatoid arthritis positive ANA negative CCP mildly elevated ESR 26 and CRP 2.5 as well as elevated RA(101.4).  Outpatient rheumatology Dr.Aryal curbside consulted recommend treatment with prednisone and taper.  CK improved to 226 as well as improved LFTs..  Subcutaneous Lovenox for DVT prophylaxis.  Tolerating a regular diet.  Therapy evaluations completed due to patient decreased functional mobility was admitted for a comprehensive rehab program.  Patient currently requires min with mobility secondary to muscle weakness, decreased cardiorespiratoy endurance, decreased initiation, decreased problem solving, decreased safety awareness, decreased memory, and delayed processing, and decreased sitting balance, decreased standing balance, decreased postural control, and decreased balance strategies.  Prior to hospitalization, patient was independent  with mobility and lived with Alone in a House home.  Home access is  Stairs to enter.  Patient will benefit from skilled PT intervention to maximize safe functional mobility and minimize fall risk for planned discharge home with 24 hour supervision.  Anticipate patient will benefit from follow up [place] at discharge.  PT - End of Session Activity Tolerance: Tolerates 10 - 20 min activity with multiple rests Endurance Deficit: Yes PT Assessment Rehab Potential (ACUTE/IP ONLY): Good PT Barriers to Discharge: [place] home environment;Decreased caregiver support;Home environment access/layout;Lack of/limited family support PT Patient demonstrates impairments in the following area(s): Balance;Edema;Endurance;Motor;Behavior;Nutrition;Pain;Perception;Safety;Sensory;Skin Integrity PT Transfers Functional Problem(s): Bed Mobility;Bed to Chair;Car;Furniture PT Locomotion Functional Problem(s): Ambulation;Wheelchair Mobility;Stairs PT Plan PT Intensity: Minimum of 1-2 x/day ,45 to 90 minutes PT Frequency: 5 out of 7  days PT Duration Estimated Length of Stay: 1.5-2 weeks PT Treatment/Interventions: Ambulation/gait training;DME/adaptive equipment instruction;Psychosocial support;UE/LE Strength taining/ROM;Balance/vestibular  training;Functional electrical stimulation;Skin care/wound management;UE/LE Coordination activities;Cognitive remediation/compensation;Functional mobility training;Splinting/orthotics;Visual/perceptual remediation/compensation;Community reintegration;Neuromuscular re-education;Stair training;Wheelchair propulsion/positioning;Discharge planning;Pain management;Therapeutic Activities;Disease management/prevention;Patient/family education;Therapeutic Exercise PT Transfers Anticipated Outcome(s): Modi PT Locomotion Anticipated Outcome(s): spv PT Recommendation Recommendations for Other Services: Neuropsych consult Follow Up Recommendations: Home health PT;24 hour supervision/assistance Patient destination: Home Equipment Recommended: To be determined   PT Evaluation Precautions/Restrictions Precautions Precautions: Fall Restrictions Weight Bearing Restrictions: No General   Vital SignsTherapy Vitals BP: 120/78 Pain Pain Assessment Pain Scale: 0-10 Pain Score: 0-No pain Pain Interference Pain Interference Pain Effect on Sleep: 0. Does not apply - I have not had any pain or hurting in the past 5 days Pain Interference with Therapy Activities: 0. Does not apply - I have not received rehabilitationtherapy in the past 5 days Pain Interference with Day-to-Day Activities: 1. Rarely or not at all Home Living/Prior [place] Available Help at Discharge: Family;Friend(s);Available PRN/intermittently Type of Home: House Home Access: Stairs to enter Entrance Stairs-Rails: Right;Left;Can reach both Home Layout: Two level Alternate Level Stairs-Number of Steps: 10 Alternate Level Stairs-Rails: Right Bathroom Shower/Tub: Multimedia programmer: Standard Bathroom  Accessibility: Yes Additional Comments: bed and bath are both on 2nd floor. first floor has half bath and kitchen  Lives With: Alone Prior Function Level of Independence: Independent with transfers;Independent with basic ADLs;Independent with homemaking with ambulation;Independent with gait  Able to Take Stairs?: Yes Driving: Yes Vision/Perception  Vision - History Ability to See in Adequate Light: 0 Adequate Perception Perception: Within Functional Limits Praxis Praxis: Intact  Cognition Overall Cognitive Status: No family/caregiver present to determine baseline cognitive functioning Arousal/Alertness: Awake/alert Orientation Level: Oriented X4 Year: 2022 Month: October Day of Week: Correct Memory: Appears intact Awareness: Appears intact Problem Solving: Appears intact Safety/Judgment: Appears intact Sensation Sensation Light Touch: Appears Intact Hot/Cold: Not tested Proprioception: Appears Intact Stereognosis: Appears Intact Coordination Gross Motor Movements are Fluid and Coordinated: No Fine Motor Movements are Fluid and Coordinated: No Finger Nose Finger Test: very effortful and limited ROM B with reports of "cramping" in L shoulder and mild dysmetria B Heel Shin Test: limited ROM and very effortful Motor  Motor Motor: Abnormal postural alignment and control;Clonus Motor - Skilled Clinical Observations: generalized weakness, stiffness, (+) B clonus   Trunk/Postural Assessment  Cervical Assessment Cervical Assessment: Within Functional Limits Thoracic Assessment Thoracic Assessment: Exceptions to [facility] Lumbar Assessment Lumbar Assessment: Exceptions to [facility] Postural Control Postural Control: Deficits on evaluation Righting Reactions: delayed and inadequate Protective Responses: delayed and inadequate  Balance Balance Balance Assessed: Yes Static Sitting Balance Static Sitting - Balance Support: Feet supported Static Sitting - Level of Assistance: 5: Stand  by assistance Dynamic Sitting Balance Dynamic Sitting - Balance Support: During functional activity;Feet supported Dynamic Sitting - Level of Assistance: 5: Stand by assistance Static Standing Balance Static Standing - Balance Support: Bilateral upper extremity supported Static Standing - Level of Assistance: 4: Min assist Dynamic Standing Balance Dynamic Standing - Balance Support: Bilateral upper extremity supported;During functional activity Dynamic Standing - Level of Assistance: 4: Min assist Extremity Assessment      RLE Assessment RLE Assessment: Exceptions to [facility] RLE Strength Right Hip Flexion: 3-/5 Right Hip ABduction: 3/5 Right Hip ADduction: 3/5 Right Knee Flexion: 3-/5 Right Knee Extension: 3-/5 Right Ankle Dorsiflexion: 3-/5 Right Ankle Plantar Flexion: 3-/5 RLE Tone RLE Tone: Other (comment) (rigidity) LLE Assessment LLE Assessment: Exceptions to [facility] LLE Strength Left Hip Flexion: 3-/5 Left Hip ABduction: 3-/5 Left Hip ADduction: 3-/5 Left Knee Flexion: 3-/5 Left Knee Extension: 3-/5 Left Ankle  Dorsiflexion: 3-/5 Left Ankle Plantar Flexion: 3-/5 LLE Tone LLE Tone: Other (comment) (rigidity)  Care Tool Care Tool Bed Mobility Roll left and right activity   Roll left and right assist level: Contact Guard/Touching assist    Sit to lying activity   Sit to lying assist level: Minimal Assistance - Patient > 75%    Lying to sitting on side of bed activity   Lying to sitting on side of bed assist level: the ability to move from lying on the back to sitting on the side of the bed with no back support.: Minimal Assistance - Patient > 75%     Care Tool Transfers Sit to stand transfer   Sit to stand assist level: Minimal Assistance - Patient > 75%    Chair/bed transfer   Chair/bed transfer assist level: Minimal Assistance - Patient > 75%     Toilet transfer   Assist Level: Minimal Assistance - Patient > 75%    Car transfer Car transfer activity did not  occur: Safety/medical concerns        Care Tool Locomotion Ambulation   Assist level: Minimal Assistance - Patient > 75% Assistive device: Walker-rolling Max distance: 25  Walk 10 feet activity   Assist level: Minimal Assistance - Patient > 75% Assistive device: Walker-rolling   Walk 50 feet with 2 turns activity Walk 50 feet with 2 turns activity did not occur: Safety/medical concerns      Walk 150 feet activity Walk 150 feet activity did not occur: Safety/medical concerns      Walk 10 feet on uneven surfaces activity Walk 10 feet on uneven surfaces activity did not occur: Safety/medical concerns      Stairs Stair activity did not occur: Safety/medical concerns        Walk up/down 1 step activity Walk up/down 1 step or curb (drop down) activity did not occur: Safety/medical concerns      Walk up/down 4 steps activity Walk up/down 4 steps activity did not occur: Safety/medical concerns      Walk up/down 12 steps activity Walk up/down 12 steps activity did not occur: Safety/medical concerns      Pick up small objects from floor Pick up small object from the floor (from standing position) activity did not occur: Safety/medical concerns      Wheelchair Is the patient using a wheelchair?: Yes Type of Wheelchair: Manual   Wheelchair assist level: Total Assistance - Patient < 25% Max wheelchair distance: 150  Wheel 50 feet with 2 turns activity   Assist Level: Total Assistance - Patient < 25%  Wheel 150 feet activity   Assist Level: Total Assistance - Patient < 25%    Refer to Care Plan for Long Term Goals  SHORT TERM GOAL WEEK 1 PT Short Term Goal 1 (Week 1): Patient will complete bed mobility with CGA consistently PT Short Term Goal 2 (Week 1): Patient will transfer bed <> wc with LRAD and CGA consistently PT Short Term Goal 3 (Week 1): Patient will ambulate >56f with LRAD and CGA  Recommendations for other services: Neuropsych and Other: speech  Skilled  Therapeutic Intervention Mobility Bed Mobility Bed Mobility: Rolling Right;Rolling Left;Supine to Sit;Sit to Supine Rolling Right: Contact Guard/Touching assist Rolling Left: Contact Guard/Touching assist Supine to Sit: Contact Guard/Touching assist Sit to Supine: Contact Guard/Touching assist Transfers Transfers: Sit to Stand;Stand to Sit;Stand Pivot Transfers Sit to Stand: Minimal Assistance - Patient > 75% Stand to Sit: Minimal Assistance - Patient > 75% Stand Pivot Transfers: Minimal  Assistance - Patient > 75% Stand Pivot Transfer Details: Verbal cues for safe use of DME/AE;Verbal cues for precautions/safety Transfer (Assistive device): Rolling walker Locomotion  Gait Ambulation: Yes Gait Assistance: Minimal Assistance - Patient > 75% Gait Distance (Feet): 20 Feet Assistive device: Rolling walker Gait Gait: Yes   Discharge Criteria: Patient will be discharged from PT if patient refuses treatment 3 consecutive times without medical reason, if treatment goals not met, if there is a change in medical status, if patient makes no progress towards goals or if patient is discharged from hospital.  The above assessment, treatment plan, treatment alternatives and goals were discussed and mutually agreed upon: by patient  Debbora Dus 04/09/2021, 11:24 AM

## 2021-04-10 NOTE — Progress Notes (Signed)
Occupational Therapy Session Note  Patient Details  Name: Carrie [name] MRN: [id number] Date of Birth: Apr 08, 1961  Today's Date: 04/10/2021 OT Individual Time: 6644-0347 OT Individual Time Calculation (min): 60 min  and Today's Date: 04/10/2021 OT Missed Time: 15 Minutes Missed Time Reason: Patient fatigue   Short Term Goals: Week 1:  OT Short Term Goal 1 (Week 1): Pt will indepedently verbalize at least 2 energy conservation strategies to implement into ADL. OT Short Term Goal 2 (Week 1): Pt will don pants with CGA and AE PRN. OT Short Term Goal 3 (Week 1): Pt will complete shower transfer with CGA and LRAD.  Skilled Therapeutic Interventions/Progress Updates:    Pt greeted at time of session supine in bed resting but agreeable to OT session, c/o pain in throat from dry mouth and RN aware. RN present at beginning of session for morning meds and pt relayed to nursing. Throat spray and ice water provided for the pt. Supine > sit Min A for trunk elevation and ambulated bed > bathroom Min guard with RW. Transferred to commode same manner and clothing management Min A. Pt did urinate, able to perform hygiene seated. Doffed LB clothing before walking short distance to shower bench. Doffed remainder of clothing seated on bench and pt initiated bathing tasks seated but after approx 2-3 minutes pt stating "I cant do this I have to get out" as pt felt intense fatigue and stating cannot continue. Encouraged pt to breathe, relax, take a rest break before stand pivot shower > wheelchair Min A. After seated rest break, set up at sink for remainder of bathing tasks sit <> stand for buttocks/periarea. UB dress Min 2/2 fatigue and LB dressing Mod again from fatigue. Stand pivot > bed Min A alarm on call bell in reach. Missed 15 mins 2/2 fatigue.     Therapy Documentation Precautions:  Precautions Precautions: Fall Precaution Comments: fatigues quickly Restrictions Weight Bearing Restrictions: (P)  No    Therapy/Group: Individual Therapy  Erasmo Score 04/10/2021, 7:12 AM

## 2021-04-10 NOTE — Progress Notes (Signed)
Physical Therapy Session Note  Patient Details  Name: Carrie [name] MRN: [id number] Date of Birth: 1960-09-15  Today's Date: 04/10/2021 PT Individual Time: 1000-1054 PT Individual Time Calculation (min): 54 min   Short Term Goals: Week 1:  PT Short Term Goal 1 (Week 1): Patient will complete bed mobility with CGA consistently PT Short Term Goal 2 (Week 1): Patient will transfer bed <> wc with LRAD and CGA consistently PT Short Term Goal 3 (Week 1): Patient will ambulate >36ft with LRAD and CGA  Skilled Therapeutic Interventions/Progress Updates:    Session 1: pt received in bed and agreeable to therapy. No complaint of pain. Pt required min A supine>sit. Min A-CGA at times for Sit to stand and stand pivot transfers throughout session. Pt transported to therapy gym for time management and energy conservation. Pt performed stand pivot transfer to nustep. Pt required max encourage ment to perform 3 x 2 min bouts at level 3, declined further bouts. Pt then ambulated x 70 ft, x 100 ft with min A and RW. VC for upright posture and incr step length/clearance. Pt then directed in Sit to stand 2 x 10. After first bout, pt declined repeating the activity, but was agreeable after education on the importance of exercise and repetition for regaining strength. Pt required continued education on this subject throughout session. Pt then ambulated x 40 ft and stated "I'm out of breath." O2=99%, HR=118 spm. Pt transported back to room for energy conservation. CGA Stand pivot transfer to bed, pt required min a for sit>supine and to scoot toward HOB. Pt remained in bed after session and was left with all needs in reach and alarm active.   Session 2: Pt seated on commode on arrival, handed off from NT. Pt required extended time for toileting, supervision for hygiene, min A for clothing management. CGA for in room gait and standing hand hygiene. Pt requested tylenol for hand pain at this time, therapist requested it  from nsg. Pt declined to leave the room at this time. Directed in standing marching, self-terminated exercise after ~20 sec. Pt then declined any further activity. Pt returned to bed with supervision and was left with all needs in reach and alarm active. Pt missed 17 min of scheduled PT, will follow up as able.   Therapy Documentation Precautions:  Precautions Precautions: Fall Precaution Comments: fatigues quickly Restrictions Weight Bearing Restrictions: No General: PT Amount of Missed Time (min): 17 Minutes PT Missed Treatment Reason: Patient fatigue;Patient unwilling to participate Vital Signs:   Pain:   Mobility:   Locomotion :    Trunk/Postural Assessment :    Balance:   Exercises:   Other Treatments:      Therapy/Group: Individual Therapy  [name] 04/10/2021, 10:36 AM

## 2021-04-10 NOTE — Progress Notes (Signed)
Physical Therapy Session Note  Patient Details  Name: Carrie [name] MRN: [id number] Date of Birth: 1961/05/01  Today's Date: 04/10/2021 PT Individual Time: 1335-1415 PT Individual Time Calculation (min): 40 min   Short Term Goals: Week 1:  PT Short Term Goal 1 (Week 1): Patient will complete bed mobility with CGA consistently PT Short Term Goal 2 (Week 1): Patient will transfer bed <> wc with LRAD and CGA consistently PT Short Term Goal 3 (Week 1): Patient will ambulate >48ft with LRAD and CGA  Skilled Therapeutic Interventions/Progress Updates:    Denies pain. Supine to sit w/min to mod assist, scoots in sitting w/additional time, mod assist to inititate, cga to complete.  Sit to stand from bed w/mod assist to power up. stand pivot transfer to wc w/min assist w/RW Pt transported to gym.  Sit to/from stand w/RW w/cues for safe hand placeet. Gait 53ft x 2 w/RW, cga, slow cadence.  Sit to stand to sit from wc requires cues for hand placment/mechanics and mod assist.  Standing partial squats x 5, audible crepitous from knees w/activity.  Nustep x 5 min L3 total steps for general strengthening and ROM in decreased wbing condition/joint stress reduction.  stand pivot transfer to/from Nustep w/RW, cues for sequencing/hand placement, min assist.  Pt transported to room.  Pt left oob in wc w/alarm belt set and needs in reach    Therapy Documentation Precautions:  Precautions Precautions: Fall Precaution Comments: fatigues quickly Restrictions Weight Bearing Restrictions: No     Therapy/Group: Individual Therapy [name], PT   Shearon Balo 04/10/2021, 3:52 PM

## 2021-04-10 NOTE — Progress Notes (Signed)
Occupational Therapy Session Note  Patient Details  Name: [name] MRN: [id number] Date of Birth: [birth year]  Today's Date: 04/12/2021 OT Individual Time: 0917-1010 OT Individual Time Calculation (min): 53 min  22 minutes missed  Short Term Goals: Week 1:  OT Short Term Goal 1 (Week 1): Pt will indepedently verbalize at least 2 energy conservation strategies to implement into ADL. OT Short Term Goal 2 (Week 1): Pt will don pants with CGA and AE PRN. OT Short Term Goal 3 (Week 1): Pt will complete shower transfer with CGA and LRAD.  Skilled Therapeutic Interventions/Progress Updates:    Pt greeted in bed, reporting that she had just woken up. Declined shower today, but did want to brush her teeth and change clothes. Min A for supine<sit with HOB elevated. CGA + vcs for short distance ambulatory transfer to the w/c parked in front of sink using RW for standing support. She then completed face washing and oral care while seated for energy conservation. Invited her to stand to work on standing endurance however pt declined due to fatigue. AE training during LB dressing, pt using both dressing stick + sock aide with instruction. Note that reachers were out of stock. Pt required step by step cues, multiple rest breaks, and overall Mod-Max A to use AE effectively. Reviewed diaphragmatic breathing techniques and guided pt through diaphragmatic breathing exercises for energy conservation. CGA for short distance ambulatory transfer back to bed using RW. She was reportedly exhausted after AE training, wanted to return to bed and eat her breakfast. Min A for foot positioning when boosting herself up in bed. She remained in bed at close of session with assistance for setting up her breakfast. All needs within reach and bed alarm set. Time missed due to pt fatigue.   Therapy Documentation Precautions:  Precautions Precautions: Fall Precaution Comments: fatigues quickly Restrictions Weight Bearing  Restrictions: No General: General OT Amount of Missed Time: 22 Minutes  Pain: "all over" and swelling in face, notified RN of her request for pain medicine and provided a pain medicine for face swelling   ADL: ADL Eating: Set up Where Assessed-Eating: Wheelchair Grooming: Supervision/safety Where Assessed-Grooming: Sitting at sink Upper Body Bathing: Supervision/safety Where Assessed-Upper Body Bathing: Sitting at sink Lower Body Bathing: Minimal assistance Where Assessed-Lower Body Bathing: Standing at sink Upper Body Dressing: Supervision/safety Where Assessed-Upper Body Dressing: Sitting at sink Lower Body Dressing: Minimal assistance Where Assessed-Lower Body Dressing: Edge of bed Toileting: Minimal assistance Where Assessed-Toileting: Teacher, adult education: Curator Method: Proofreader: Grab bars, Raised toilet seat Tub/Shower Transfer: Not assessed Film/video editor: Not assessed  Therapy/Group: Individual Therapy  [name] 04/12/2021, 12:34 PM

## 2021-04-10 NOTE — Progress Notes (Signed)
PROGRESS NOTE   Subjective/Complaints:   Pt denies depression, still however OT and I are concerned depression could be playing a part in poor energy- pt said has difficulty with endurance (exhausted just walking to bathroom for shower with OT) also just no energy to do anything.   Slept OK. Denies pain.   ROS:  Pt denies SOB, abd pain, CP, N/V/C/D, and vision changes  Objective:   No results found. Recent Labs    04/09/21 0620  WBC 5.3  HGB 12.7  HCT 37.2  PLT 255   Recent Labs    04/09/21 0620  NA 136  K 3.5  CL 109  CO2 20*  GLUCOSE 87  BUN 16  CREATININE 1.01*  CALCIUM 9.0    Intake/Output Summary (Last 24 hours) at 04/10/2021 0918 Last data filed at 04/10/2021 0554 Gross per 24 hour  Intake 460 ml  Output 0 ml  Net 460 ml        Physical Exam: Vital Signs Blood pressure 103/73, pulse 98, temperature 97.9 F (36.6 C), resp. rate 16, height 5\' 5"  (1.651 m), weight 72.5 kg, SpO2 98 %.    General: awake, alert, appropriate, sitting in shower- no water yet; OT at side; NAD HENT: conjugate gaze; oropharynx moist CV: regular rate; no JVD Pulmonary: CTA B/L; no W/R/R- good air movement GI: soft, NT, ND, (+)BS Psychiatric: appropriate but looks exhausted/no energy- maybe depressed, but she denies Neurological: Ox3  Musculoskeletal:     Cervical back: Normal range of motion. No rigidity.     Comments: 4+/5 in arms proximal and distally B/L HF 4-/5; KE 4/5 DF/PF 4+/5 B/L  No swollen joints seen on exam  Skin:    Comments: Intact to light touch on exam B/L  Neurological:     Comments: Patient is alert.  Mood is a bit flat.  Light touch intact x all 4 extremities.    Assessment/Plan: 1. Functional deficits which require 3+ hours per day of interdisciplinary therapy in a comprehensive inpatient rehab setting. Physiatrist is providing close team supervision and 24 hour management of active medical  problems listed below. Physiatrist and rehab team continue to assess barriers to discharge/monitor patient progress toward functional and medical goals  Care Tool:  Bathing    Body parts bathed by patient: Right arm, Left arm, Chest, Abdomen, Face, Right upper leg, Left upper leg, Front perineal area, Buttocks   Body parts bathed by helper: Right lower leg, Left lower leg     Bathing assist Assist Level: Minimal Assistance - Patient > 75%     Upper Body Dressing/Undressing Upper body dressing   What is the patient wearing?: Pull over shirt    Upper body assist Assist Level: Minimal Assistance - Patient > 75% (2/2 fatigue)    Lower Body Dressing/Undressing Lower body dressing      What is the patient wearing?: Pants, Underwear/pull up     Lower body assist Assist for lower body dressing: Moderate Assistance - Patient 50 - 74%     Toileting Toileting    Toileting assist Assist for toileting: Minimal Assistance - Patient > 75%     Transfers Chair/bed transfer  Transfers assist  Chair/bed transfer assist level: Minimal Assistance - Patient > 75%     Locomotion Ambulation   Ambulation assist      Assist level: Minimal Assistance - Patient > 75% Assistive device: Walker-rolling Max distance: 25   Walk 10 feet activity   Assist     Assist level: Minimal Assistance - Patient > 75% Assistive device: Walker-rolling   Walk 50 feet activity   Assist Walk 50 feet with 2 turns activity did not occur: Safety/medical concerns         Walk 150 feet activity   Assist Walk 150 feet activity did not occur: Safety/medical concerns         Walk 10 feet on uneven surface  activity   Assist Walk 10 feet on uneven surfaces activity did not occur: Safety/medical concerns         Wheelchair     Assist Is the patient using a wheelchair?: Yes Type of Wheelchair: Manual    Wheelchair assist level: Total Assistance - Patient < 25% Max  wheelchair distance: 150    Wheelchair 50 feet with 2 turns activity    Assist        Assist Level: Total Assistance - Patient < 25%   Wheelchair 150 feet activity     Assist      Assist Level: Total Assistance - Patient < 25%   Blood pressure 103/73, pulse 98, temperature 97.9 F (36.6 C), resp. rate 16, height 5\' 5"  (1.651 m), weight 72.5 kg, SpO2 98 %.    Medical Problem List and Plan: 1.  Debility secondary to generalized pain/rheumatoid arthritis.  Follow-up outpatient rheumatology services Dr .  Prednisone taper as indicated             -patient may  shower             -ELOS/Goals: 5-7 days mod I  Con't PT and OT- needs to be taught about energy conservation- on stable prednisone now 2.  Antithrombotics: -DVT/anticoagulation:  Pharmaceutical: Lovenox             -antiplatelet therapy: n/a 3. Pain Management: Tramadol as needed  11/3- on stable prednisone dose- pain controlled- con't regimen 4. Mood: Wellbutrin 300 mg daily, Lexapro 20 mg daily             -antipsychotic agents: N/A 5. Neuropsych: This patient is capable of making decisions on her own behalf. 6. Skin/Wound Care: Routine skin checks 7. Fluids/Electrolytes/Nutrition: Routine in and outs with follow-up chemistries 8.  Hypertension.  Norvasc 5 mg daily, lisinopril 30 mg daily.  Monitor with increased mobility  11/3- BP soft, but not symptomatic- con't regimen 9.  Elevated LFTs/transaminitis- as well as CK.  Improved.  Follow-up chemistries  11/2- coming down- is 60/50 now- 10/24 was 150/135- so improving- con't to monitor 10.  Hyperlipidemia.  Lipitor held due to elevated LFTs 11. Poor energy  11/3- will check TSH and Vit D levels in AM- could be causing 12. Hx of depression  11/3- on wellbutrin and Lexapro- high doses- will con't         LOS: 2 days A FACE TO FACE EVALUATION WAS PERFORMED  Carrie [name] 04/10/2021, 9:18 AM

## 2021-04-11 LAB — TSH: TSH: 2.167 u[IU]/mL (ref 0.350–4.500)

## 2021-04-11 LAB — VITAMIN D 25 HYDROXY (VIT D DEFICIENCY, FRACTURES): Vit D, 25-Hydroxy: 24.58 ng/mL — ABNORMAL LOW (ref 30–100)

## 2021-04-11 MED ORDER — NYSTATIN 100000 UNIT/ML MT SUSP
5.0000 mL | Freq: Four times a day (QID) | OROMUCOSAL | Status: AC
  Administered 2021-04-11 – 2021-04-13 (×10): 500000 [IU] via ORAL
  Filled 2021-04-11 (×9): qty 5

## 2021-04-11 MED ORDER — LISINOPRIL 20 MG PO TABS
20.0000 mg | ORAL_TABLET | Freq: Every day | ORAL | Status: DC
  Administered 2021-04-12 – 2021-04-14 (×3): 20 mg via ORAL
  Filled 2021-04-11 (×4): qty 1

## 2021-04-11 MED ORDER — FLUCONAZOLE 100 MG PO TABS
100.0000 mg | ORAL_TABLET | Freq: Every day | ORAL | Status: AC
  Administered 2021-04-12 – 2021-04-17 (×6): 100 mg via ORAL
  Filled 2021-04-11 (×7): qty 1

## 2021-04-11 MED ORDER — SODIUM CHLORIDE 0.9 % IV SOLN: Freq: Once | INTRAVENOUS | Status: AC

## 2021-04-11 MED ORDER — FLUCONAZOLE 100 MG PO TABS
200.0000 mg | ORAL_TABLET | Freq: Once | ORAL | Status: AC
  Administered 2021-04-11: 200 mg via ORAL
  Filled 2021-04-11: qty 2

## 2021-04-11 NOTE — Progress Notes (Signed)
Physical Therapy Session Note  Patient Details  Name: Carrie [name] MRN: [id number] Date of Birth: March 01, 1961  Today's Date: 04/11/2021 PT Individual Time: 1000-1015 PT Individual Time Calculation (min): 15 min   Short Term Goals: Week 1:  PT Short Term Goal 1 (Week 1): Patient will complete bed mobility with CGA consistently PT Short Term Goal 2 (Week 1): Patient will transfer bed <> wc with LRAD and CGA consistently PT Short Term Goal 3 (Week 1): Patient will ambulate >55ft with LRAD and CGA  Skilled Therapeutic Interventions/Progress Updates: Pt presents in BR w/ call bell pulled.  Pt sitting in w/c and PT wheeled into room.  Pt stated that she needed a little rest because she wasn't sure if could tolerate therapy.  BP taken 2/2 c/o lightheadeness.  BP shows at 81/37 and PT removed and jacket removed to retake.  RN enters room and BP at 76/65.  Pt returned to bed w/ SPT and min A, min A sit to supine.  RN placed pt in Trendelenburg w/ increased numbers.  Pt required min A to sit at EOB for attempted orthostatics w/ BP again decreased (see RN note).  Pt returned to supine and  placed in Trendelenburg position.  RN took over care.     Therapy Documentation Precautions:  Precautions Precautions: Fall Precaution Comments: fatigues quickly Restrictions Weight Bearing Restrictions: No General: PT Missed Treatment Reason: Patient ill (Comment) (Pt w/ decreased BP and returned to bed w/ nursing care given.) Vital Signs: Therapy Vitals Temp: 97.8 F (36.6 C) Temp Source: Oral Pulse Rate: 96 Resp: 19 BP: 94/68 Patient Position (if appropriate): Lying Oxygen Therapy SpO2: 100 % O2 Device: Room Air Pain:0/10       Therapy/Group: Individual Therapy  Lucio Edward 04/11/2021, 10:54 AM

## 2021-04-11 NOTE — Progress Notes (Addendum)
Physical Therapy Session Note  Patient Details  Name: Carrie [name] MRN: [id number] Date of Birth: Apr 12, 1961  Today's Date: 04/11/2021 PT Individual Time: 0800-0855 PT Individual Time Calculation (min): 55 min   Short Term Goals: Week 1:  PT Short Term Goal 1 (Week 1): Patient will complete bed mobility with CGA consistently PT Short Term Goal 2 (Week 1): Patient will transfer bed <> wc with LRAD and CGA consistently PT Short Term Goal 3 (Week 1): Patient will ambulate >59ft with LRAD and CGA  Skilled Therapeutic Interventions/Progress Updates:    pt received in bed and agreeable to therapy. Pt reports sore throat, but declines throat spray, provided with ice water. Bed mobility with min A, pt able to roll and shift legs off bed but grabbed therapists hand to pull up before therapist could cue for proper hand placement.   Pt requested to toilet before leaving room, ambulatory transfer with RW. Supervision for hygiene, min A for clothing management. Pt required seated rest break before washing hands in standing. Changed socks with total assist for time.   Stand pivot transfer w/c>mat table with RW, mod A to power up to stand fading to min A for balance only. Pt transported to/from therapy gym for time management and energy conservation.   Pt performed Sit to stand, 2 x 5. Pt demoes posterior weight shift with toes coming off the floor as she stands. Instructed on anterior weight shift, fading from mod A to power up to CGA for balance. Pt continues to recall and perform anterior weight shift throughout session.  Pt then requested to lay down d/t cramping in her back on the lower L side, possibly QL. Pt reports symptoms resolved in supine. Sit>supine  with min A and supine>sit with supervision and VC for sequencing and technique. Upon sitting, pt reports mild dizziness that resolves within a few minutes od sitting quietly, taking deep breaths to promote venous return and sipping water. Pt's HR  noted to be 114-118 bpm during rest breaks. Nsg made aware.  Pt was agreeable to continued exercise and performed 2 x 10 step taps to promote endurance and single leg stance stability, RW and CGA for balance.  Pt required extended seated rest breaks throughout session. Therapist provided education on energy conservation and psychosocial support where appropriate throughout session. Pt expressed frustration with her "mood," taken to mean her depressed affect. Pt again encouraged that progress takes time and was minimally receptive. Pt remained in w/c at tend of session and was left with all needs in reach and alarm active.     Therapy Documentation Precautions:  Precautions Precautions: Fall Precaution Comments: fatigues quickly Restrictions Weight Bearing Restrictions: No General:      Therapy/Group: Individual Therapy  LEILONI SMITHERS 04/11/2021, 8:09 AM

## 2021-04-11 NOTE — Progress Notes (Signed)
Occupational Therapy Session Note  Patient Details  Name: Carrie [name] MRN: [id number] Date of Birth: 1961/01/23  Today's Date: 04/11/2021 OT Individual Time: 1345-1400 OT Individual Time Calculation (min): 15 min    Short Term Goals: Week 1:  OT Short Term Goal 1 (Week 1): Pt will indepedently verbalize at least 2 energy conservation strategies to implement into ADL. OT Short Term Goal 2 (Week 1): Pt will don pants with CGA and AE PRN. OT Short Term Goal 3 (Week 1): Pt will complete shower transfer with CGA and LRAD.   Skilled Therapeutic Interventions/Progress Updates:    Pt supine in bed with HOB slighlty raised, reporting she feels "awful" and very tired.  Checked vitals: BP 94/72, pulse 101, SPO2 on RA 100%.  Nurse and PA made aware of pt's reports and vitals.  Pt politely declining participation with OT at this time. Encouraged pt to drink fluids and provided her with fresh ice water.  Call bell in reach, bed alarm on.  45 minutes of treatment session missed.  Therapy Documentation Precautions:  Precautions Precautions: Fall Precaution Comments: fatigues quickly Restrictions Weight Bearing Restrictions: No General: General PT Missed Treatment Reason: Patient ill (Comment) (Pt w/ decreased BP and returned to bed w/ nursing care given.)    Therapy/Group: Individual Therapy  Amie Critchley 04/11/2021, 2:24 PM

## 2021-04-11 NOTE — Progress Notes (Signed)
PROGRESS NOTE   Subjective/Complaints:   Throat real sore and things taste weird/different.  Ate 40-50% of breakfast; throat spray doesn't work for sore throat.    Slept OK- still exhausted all the time.   ROS:   Pt denies SOB, abd pain, CP, N/V/C/D, and vision changes   Objective:   No results found. Recent Labs    04/09/21 0620  WBC 5.3  HGB 12.7  HCT 37.2  PLT 255   Recent Labs    04/09/21 0620  NA 136  K 3.5  CL 109  CO2 20*  GLUCOSE 87  BUN 16  CREATININE 1.01*  CALCIUM 9.0    Intake/Output Summary (Last 24 hours) at 04/11/2021 0815 Last data filed at 04/10/2021 1000 Gross per 24 hour  Intake 120 ml  Output --  Net 120 ml        Physical Exam: Vital Signs Blood pressure 107/67, pulse 63, temperature 98.2 F (36.8 C), resp. rate 16, height 5\' 5"  (1.651 m), weight 72.5 kg, SpO2 96 %.     General: awake, alert, appropriate, laying supine in bed; ate 50% of breakfast; NAD HENT: conjugate gaze; oropharynx dry- throat erythematous- a few patches of thrush/ tongue dry CV: regular rate; no JVD Pulmonary: CTA B/L; no W/R/R- good air movement GI: soft, NT, ND, (+)BS Psychiatric: appropriate; flat; appears fatigued Neurological: Ox3  Musculoskeletal:     Cervical back: Normal range of motion. No rigidity.     Comments: 4+/5 in arms proximal and distally B/L HF 4-/5; KE 4/5 DF/PF 4+/5 B/L  No swollen joints seen on exam  Skin:    Comments: Intact to light touch on exam B/L  Neurological:     Comments: Patient is alert.  Mood is a bit flat.  Light touch intact x all 4 extremities.    Assessment/Plan: 1. Functional deficits which require 3+ hours per day of interdisciplinary therapy in a comprehensive inpatient rehab setting. Physiatrist is providing close team supervision and 24 hour management of active medical problems listed below. Physiatrist and rehab team continue to assess barriers to  discharge/monitor patient progress toward functional and medical goals  Care Tool:  Bathing    Body parts bathed by patient: Right arm, Left arm, Chest, Abdomen, Face, Right upper leg, Left upper leg, Front perineal area, Buttocks   Body parts bathed by helper: Right lower leg, Left lower leg     Bathing assist Assist Level: Minimal Assistance - Patient > 75%     Upper Body Dressing/Undressing Upper body dressing   What is the patient wearing?: Pull over shirt    Upper body assist Assist Level: Minimal Assistance - Patient > 75% (2/2 fatigue)    Lower Body Dressing/Undressing Lower body dressing      What is the patient wearing?: Pants, Underwear/pull up     Lower body assist Assist for lower body dressing: Moderate Assistance - Patient 50 - 74%     Toileting Toileting    Toileting assist Assist for toileting: Minimal Assistance - Patient > 75%     Transfers Chair/bed transfer  Transfers assist     Chair/bed transfer assist level: Contact Guard/Touching assist  Locomotion Ambulation   Ambulation assist      Assist level: Minimal Assistance - Patient > 75% Assistive device: Walker-rolling Max distance: 100"   Walk 10 feet activity   Assist     Assist level: Minimal Assistance - Patient > 75% Assistive device: Walker-rolling   Walk 50 feet activity   Assist Walk 50 feet with 2 turns activity did not occur: Safety/medical concerns  Assist level: Minimal Assistance - Patient > 75% Assistive device: Walker-rolling    Walk 150 feet activity   Assist Walk 150 feet activity did not occur: Safety/medical concerns         Walk 10 feet on uneven surface  activity   Assist Walk 10 feet on uneven surfaces activity did not occur: Safety/medical concerns         Wheelchair     Assist Is the patient using a wheelchair?: Yes Type of Wheelchair: Manual    Wheelchair assist level: Total Assistance - Patient < 25% Max wheelchair  distance: 150    Wheelchair 50 feet with 2 turns activity    Assist        Assist Level: Total Assistance - Patient < 25%   Wheelchair 150 feet activity     Assist      Assist Level: Total Assistance - Patient < 25%   Blood pressure 107/67, pulse 63, temperature 98.2 F (36.8 C), resp. rate 16, height 5\' 5"  (1.651 m), weight 72.5 kg, SpO2 96 %.    Medical Problem List and Plan: 1.  Debility secondary to generalized pain/rheumatoid arthritis.  Follow-up outpatient rheumatology services Dr .  Prednisone taper as indicated             -patient may  shower             -ELOS/Goals: 5-7 days mod I  Con't PT and OT- esp working n endurance and energy conservation 2.  Antithrombotics: -DVT/anticoagulation:  Pharmaceutical: Lovenox             -antiplatelet therapy: n/a 3. Pain Management: Tramadol as needed  11/3- on stable prednisone dose- pain controlled- con't regimen 4. Mood: Wellbutrin 300 mg daily, Lexapro 20 mg daily             -antipsychotic agents: N/A 5. Neuropsych: This patient is capable of making decisions on her own behalf. 6. Skin/Wound Care: Routine skin checks 7. Fluids/Electrolytes/Nutrition: Routine in and outs with follow-up chemistries 8.  Hypertension.  Norvasc 5 mg daily, lisinopril 30 mg daily.  Monitor with increased mobility  11/3- BP soft, but not symptomatic- con't regimen  11/4- will decrease Lisinopril slightly- to 20 mg daily- due to continued soft BP's 9.  Elevated LFTs/transaminitis- as well as CK.  Improved.  Follow-up chemistries  11/2- coming down- is 60/50 now- 10/24 was 150/135- so improving- con't to monitor 10.  Hyperlipidemia.  Lipitor held due to elevated LFTs 11. Poor energy  11/3- will check TSH and Vit D levels in AM- could be causing  11/4- labs pending- will monitor for them 12. Hx of depression  11/3- on wellbutrin and Lexapro- high doses- will con't 13. Thrush  11/4- will start Nystatin suspension x 3 days as well  as Diflucan 200 mg x1 and 100 mg daily x 6 days.          LOS: 3 days A FACE TO FACE EVALUATION WAS PERFORMED  Arnell Slivinski 04/11/2021, 8:15 AM

## 2021-04-11 NOTE — IPOC Note (Signed)
Overall Plan of Care [facility]) Patient Details Name: Carrie [name] MRN: [id number] DOB: 08-Apr-1961  Admitting Diagnosis: Debility  Hospital Problems: Principal Problem:   Debility Active Problems:   Rheumatoid arthritis flare (HCC)   Fall (on) (from) other stairs and steps, subsequent encounter     Functional Problem List: Nursing Endurance, Safety, Skin Integrity, Medication Management, Bowel  PT Balance, Edema, Endurance, Motor, Behavior, Nutrition, Pain, Perception, Safety, Sensory, Skin Integrity  OT Balance, Skin Integrity, Cognition, Endurance, Pain, Motor  SLP    TR         Basic ADL's: OT Eating, Grooming, Bathing, Dressing, Toileting     Advanced  ADL's: OT Simple Meal Preparation, Light Housekeeping     Transfers: PT Bed Mobility, Bed to Chair, Car, Occupational psychologist, Research scientist (life sciences): PT Ambulation, Psychologist, prison and probation services, Stairs     Additional Impairments: OT Fuctional Use of Upper Extremity  SLP        TR      Anticipated Outcomes Item Anticipated Outcome  Self Feeding mod I  Swallowing      Basic self-care  mod I  Toileting  mod I   Bathroom Transfers mod I toilet, S shower  Bowel/Bladder  mod I  Transfers  Modi  Locomotion  spv  Communication     Cognition     Pain  n/a  Safety/Judgment  Mod I and no falls   Therapy Plan: PT Intensity: Minimum of 1-2 x/day ,45 to 90 minutes PT Frequency: 5 out of 7 days PT Duration Estimated Length of Stay: 1.5-2 weeks OT Intensity: Minimum of 1-2 x/day, 45 to 90 minutes OT Frequency: 5 out of 7 days OT Duration/Estimated Length of Stay: 1.5 - 2 weeks     Due to the current state of emergency, patients may not be receiving their 3-hours of Medicare-mandated therapy.   Team Interventions: Nursing Interventions Patient/Family Education, Bowel Management, Disease Management/Prevention, Medication Management, Skin Care/Wound Management, Discharge Planning  PT interventions  Ambulation/gait training, DME/adaptive equipment instruction, Psychosocial support, UE/LE Strength taining/ROM, Balance/vestibular training, Functional electrical stimulation, Skin care/wound management, UE/LE Coordination activities, Cognitive remediation/compensation, Functional mobility training, Splinting/orthotics, Visual/perceptual remediation/compensation, Community reintegration, Neuromuscular re-education, Museum/gallery curator, Wheelchair propulsion/positioning, Discharge planning, Pain management, Therapeutic Activities, Disease management/prevention, Equities trader education, Therapeutic Exercise  OT Interventions Warden/ranger, Community reintegration, Disease mangement/prevention, Neuromuscular re-education, Equities trader education, Self Care/advanced ADL retraining, Therapeutic Exercise, UE/LE Coordination activities, Wheelchair propulsion/positioning, UE/LE Strength taining/ROM, Therapeutic Activities, Psychosocial support, Pain management, Functional mobility training, DME/adaptive equipment instruction, Discharge planning, Cognitive remediation/compensation  SLP Interventions    TR Interventions    SW/CM Interventions Discharge Planning, Psychosocial Support, Patient/Family Education   Barriers to Discharge MD  Medical stability, Home enviroment access/loayout, Lack of/limited family support, Weight bearing restrictions, and Behavior  Nursing Decreased caregiver support, Home environment access/layout, Wound Care, Lack of/limited family support Lives in 2 level home alone with 2 steps to enter and no rails. Able to live on main level with bedroom/bathroom. Sister Dois Davenport can provide min assist per patient report.  PT Inaccessible home environment, Decreased caregiver support, Home environment access/layout, Lack of/limited family support    OT Decreased caregiver support    SLP      SW       Team Discharge Planning: Destination: PT-Home ,OT- Home , SLP-  Projected  Follow-up: PT-Home health PT, 24 hour supervision/assistance, OT-  Home health OT, Outpatient OT, SLP-  Projected Equipment Needs: PT-To be determined, OT- To be determined, SLP-  Equipment Details:  PT- , OT-  Patient/family involved in discharge planning: PT- Patient,  OT-Patient, SLP-   MD ELOS: 1.5 to 2 weeks Medical Rehab Prognosis:  Excellent Assessment: Pt is a 60 yr old female with fall and new dx of RA- severe fatigue, and poor endurance associated- as well as new thrush due to prednisone.  Also soft BP- need to titrate BP meds.    Goals mod I    See Team Conference Notes for weekly updates to the plan of care

## 2021-04-12 NOTE — Progress Notes (Signed)
Physical Therapy Session Note  Patient Details  Name: Carrie [name] MRN: [id number] Date of Birth: [birth year]  Today's Date: 04/12/2021 PT Individual Time: 1652-1730 and 1300-1338 PT Individual Time Calculation (min): 38 min and 38 min   Short Term Goals: Week 1:  PT Short Term Goal 1 (Week 1): Patient will complete bed mobility with CGA consistently PT Short Term Goal 2 (Week 1): Patient will transfer bed <> wc with LRAD and CGA consistently PT Short Term Goal 3 (Week 1): Patient will ambulate >84ft with LRAD and CGA   Skilled Therapeutic Interventions/Progress Updates:  Session 1.  Denies pain.  Pt received supine in bed and agreeable to PT at bed level due to excessive fatigue form PT treatment yesterday.PT assessed VS.  111/82. HR 103. Temp 97.9. SpO2 97%  Supine therex:  SAQ, Hip abduction,  SLR,  Heel slides Isometric hip adduction  Hip adduction with manual resistance bridges Each performed x 10 BLE with cues for full ROM and AAROM on the LLE intermittently due to hip weakness. Cues for decreased speed on eccentric movement. Pt requested prolonged rest break between bouts.     Session 2.  Denies pain  Pt received supine in bed and agreeable to PT. Supine>sit transfer with min assist and cues for safety and use of LUE to push in to sitting.  VS. 113/86 sitting EOB HR 115 BPM mbulatory transfer to bathroom with min assist for safety and min cues for AD management. Pt requires min assist for clothing management. Pt performed pericare. Hand hygiene standing at sink with min assist for balance. Ambulatory transfer to EOB with min assist. Pt left sitting EOB with call bell in reach and all needs met.     Therapy Documentation Precautions:  Precautions Precautions: Fall Precaution Comments: fatigues quickly Restrictions Weight Bearing Restrictions: No General: PT Amount of Missed Time (min): 22 Minutes PT Missed Treatment Reason: Patient fatigue Vital Signs: Therapy  Vitals Temp: 98.2 F (36.8 C) Temp Source: Oral Pulse Rate: 100 Resp: 17 BP: 109/76 Patient Position (if appropriate): Lying Oxygen Therapy SpO2: 98 % O2 Device: Room Air Pain: Pain Assessment Pain Scale: 0-10 Pain Score: 0-No pain   Therapy/Group: Individual Therapy  Lorie Phenix 04/12/2021, 5:35 PM

## 2021-04-13 DIAGNOSIS — R5381 Other malaise: Secondary | ICD-10-CM | POA: Diagnosis not present

## 2021-04-13 MED ORDER — MENTHOL 3 MG MT LOZG
1.0000 | LOZENGE | OROMUCOSAL | Status: DC | PRN
  Administered 2021-04-13: 3 mg via ORAL
  Filled 2021-04-13: qty 9

## 2021-04-13 MED ORDER — VITAMIN D 25 MCG (1000 UNIT) PO TABS
1000.0000 [IU] | ORAL_TABLET | Freq: Every day | ORAL | Status: DC
  Administered 2021-04-13 – 2021-04-30 (×18): 1000 [IU] via ORAL
  Filled 2021-04-13 (×18): qty 1

## 2021-04-13 MED ORDER — PREDNISONE 5 MG PO TABS
5.0000 mg | ORAL_TABLET | Freq: Every day | ORAL | Status: DC
  Administered 2021-04-14 – 2021-04-30 (×17): 5 mg via ORAL
  Filled 2021-04-13 (×17): qty 1

## 2021-04-13 NOTE — Progress Notes (Signed)
PROGRESS NOTE   Subjective/Complaints:  Pt denies joint pains, still has fatigue, asks about facial swelling , discussed side effect of prednisone    ROS:  Pt denies SOB, abd pain, CP, N/V/C/D, and vision changes  Objective:   No results found. No results for input(s): WBC, HGB, HCT, PLT in the last 72 hours.  No results for input(s): NA, K, CL, CO2, GLUCOSE, BUN, CREATININE, CALCIUM in the last 72 hours.   Intake/Output Summary (Last 24 hours) at 04/13/2021 1035 Last data filed at 04/12/2021 1805 Gross per 24 hour  Intake 360 ml  Output --  Net 360 ml         Physical Exam: Vital Signs Blood pressure 104/76, pulse 96, temperature 97.8 F (36.6 C), temperature source Oral, resp. rate 16, height 5\' 5"  (1.651 m), weight 72.5 kg, SpO2 96 %.    Musculoskeletal:     Cervical back: Normal range of motion. No rigidity.     Comments: 4+/5 in arms proximal and distally B/L HF 4-/5; KE 4/5 DF/PF 4+/5 B/L  No swollen joints seen on exam  Skin:    Comments: Intact to light touch on exam B/L  Neurological:     Comments: Patient is alert.  Mood is a bit flat.  Light touch intact x all 4 extremities.    Assessment/Plan: 1. Functional deficits which require 3+ hours per day of interdisciplinary therapy in a comprehensive inpatient rehab setting. Physiatrist is providing close team supervision and 24 hour management of active medical problems listed below. Physiatrist and rehab team continue to assess barriers to discharge/monitor patient progress toward functional and medical goals  Care Tool:  Bathing    Body parts bathed by patient: Right arm, Left arm, Chest, Abdomen, Face, Right upper leg, Left upper leg, Front perineal area, Buttocks   Body parts bathed by helper: Right lower leg, Left lower leg     Bathing assist Assist Level: Minimal Assistance - Patient > 75%     Upper Body Dressing/Undressing Upper body  dressing   What is the patient wearing?: Pull over shirt    Upper body assist Assist Level: Set up assist    Lower Body Dressing/Undressing Lower body dressing      What is the patient wearing?: Pants     Lower body assist Assist for lower body dressing: Moderate Assistance - Patient 50 - 74%     Toileting Toileting    Toileting assist Assist for toileting: Minimal Assistance - Patient > 75%     Transfers Chair/bed transfer  Transfers assist     Chair/bed transfer assist level: Minimal Assistance - Patient > 75%     Locomotion Ambulation   Ambulation assist      Assist level: Minimal Assistance - Patient > 75% Assistive device: Walker-rolling Max distance: 100"   Walk 10 feet activity   Assist     Assist level: Minimal Assistance - Patient > 75% Assistive device: Walker-rolling   Walk 50 feet activity   Assist Walk 50 feet with 2 turns activity did not occur: Safety/medical concerns  Assist level: Minimal Assistance - Patient > 75% Assistive device: Walker-rolling    Walk 150 feet activity  Assist Walk 150 feet activity did not occur: Safety/medical concerns         Walk 10 feet on uneven surface  activity   Assist Walk 10 feet on uneven surfaces activity did not occur: Safety/medical concerns         Wheelchair     Assist Is the patient using a wheelchair?: Yes Type of Wheelchair: Manual    Wheelchair assist level: Total Assistance - Patient < 25% Max wheelchair distance: 150    Wheelchair 50 feet with 2 turns activity    Assist        Assist Level: Total Assistance - Patient < 25%   Wheelchair 150 feet activity     Assist      Assist Level: Total Assistance - Patient < 25%   Blood pressure 104/76, pulse 96, temperature 97.8 F (36.6 C), temperature source Oral, resp. rate 16, height 5\' 5"  (1.651 m), weight 72.5 kg, SpO2 96 %.    Medical Problem List and Plan: 1.  Debility secondary to  generalized pain/rheumatoid arthritis.  Follow-up outpatient rheumatology services Dr .  Prednisone taper as indicated             -patient may  shower             -ELOS/Goals: 5-7 days mod I  Con't PT and OT- needs to be taught about energy conservation- on stable prednisone now 2.  Antithrombotics: -DVT/anticoagulation:  Pharmaceutical: Lovenox             -antiplatelet therapy: n/a 3. Pain Management: Tramadol as needed  11/6- on stable prednisone dose- pain controlled- pt has moon facies as side effect, will reduce to 5mg  in am   4. Mood: Wellbutrin 300 mg daily, Lexapro 20 mg daily             -antipsychotic agents: N/A 5. Neuropsych: This patient is capable of making decisions on her own behalf. 6. Skin/Wound Care: Routine skin checks 7. Fluids/Electrolytes/Nutrition: Routine in and outs with follow-up chemistries 8.  Hypertension.  Norvasc 5 mg daily, lisinopril 30 mg daily.  Monitor with increased mobility   Vitals:   04/12/21 1934 04/13/21 0424  BP: 121/84 104/76  Pulse: 98 96  Resp: 16 16  Temp: 98.1 F (36.7 C) 97.8 F (36.6 C)  SpO2: 99% 96%  Controlled low nl this am , cont to monitor, enc po fluids   9.  Elevated LFTs/transaminitis- as well as CK.  Improved.  Follow-up chemistries  11/2- coming down- is 60/50 now- 10/24 was 150/135- so improving- con't to monitor 10.  Hyperlipidemia.  Lipitor held due to elevated LFTs 11. Poor energy  11/3- will check TSH and Vit D levels in AM- could be causing 11/6 vit D level mildly reduced will supplement 1000IU daily  12. Hx of depression  11/3- on wellbutrin and Lexapro- high doses- will con't         LOS: 5 days A FACE TO FACE EVALUATION WAS PERFORMED  13/6 04/13/2021, 10:35 AM

## 2021-04-14 NOTE — Progress Notes (Signed)
Occupational Therapy Session Note  Patient Details  Name: Carrie [name] MRN: [id number] Date of Birth: 08-01-1960  Today's Date: 04/14/2021 OT Individual Time: 1015-1110 and 1400-1420 OT Individual Time Calculation (min): 55 min and 20 min Missed 10 mins in PM session 2/2 fatigue   Short Term Goals: Week 1:  OT Short Term Goal 1 (Week 1): Pt will indepedently verbalize at least 2 energy conservation strategies to implement into ADL. OT Short Term Goal 2 (Week 1): Pt will don pants with CGA and AE PRN. OT Short Term Goal 3 (Week 1): Pt will complete shower transfer with CGA and LRAD.   Skilled Therapeutic Interventions/Progress Updates:    Session 1: Pt greeted at time of session bed level resting, stating she had a good work out this morning with PT. No pain reported but very fatigued. Agreeable to attempt session, politely declined ADL for bathing/dressing but agreeable to try toileting. Supine > sit Min A with extended time and difficulty managing LE's over to EOB. Sit > stand CGA at RW in prep to walk to bathroom and pt reported feeling lightheaded, returned to sitting and BP checked at 89/76, rechecked around same number. Did not resolve and pt reclined back to bed Min/Mod A for BLE management. Scoot up in bed with cues for hand placement and to press through feet. Relayed to nursing BP. Switched to bed level there ex with 2# weighted ball for the following 2x10: ball presses to ceiling in supine, CW/CCW circles, torso twist/lateral reaches with rest break between sets. Discussion throughout session as well regarding DC planning, energy conservation, living on 1st floor when going home, ets. Pt resting bed level alarm on call bell in reach.    Session 2: Pt greeted at time of session just finishing walking back from bathroom to recliner with nursing staff, pt stating she could not do anything else today 2/2 fatigue. Note the pt had just finished with PT as well. At this time sister present as  well who is a NP and had several questions regarding CLOF, fatigue, and cognition, made aware these questions would be asked in conference. Answered questions as able, recommended speaking with MD regarding further questions. Missed 10  mins of OT 2/2 fatigue.     Therapy Documentation Precautions:  Precautions Precautions: Fall Precaution Comments: fatigues quickly Restrictions Weight Bearing Restrictions: No     Therapy/[name]: Individual Therapy  Erasmo Score 04/14/2021, 7:24 AM

## 2021-04-14 NOTE — Progress Notes (Signed)
Physical Therapy Session Note  Patient Details  Name: Carrie [name] MRN: [id number] Date of Birth: 03-13-1961  Today's Date: 04/14/2021 PT Individual Time: 0800-0900 PT Individual Time Calculation (min): 60 min   Short Term Goals: Week 1:  PT Short Term Goal 1 (Week 1): Patient will complete bed mobility with CGA consistently PT Short Term Goal 2 (Week 1): Patient will transfer bed <> wc with LRAD and CGA consistently PT Short Term Goal 3 (Week 1): Patient will ambulate >68ft with LRAD and CGA  Skilled Therapeutic Interventions/Progress Updates:    pt received in bed and agreeable to therapy. Pt reports sore throat pain, but did not request intervention throughout session. Supine>sit with close supervision and bed features. Donned shoes with total assist. Stand pivot transfer with RW and min A to stand to w/c. Pt brushed teeth and washed face mod I at w/c level. MD in/out for rounds. Pt was instructed on propelling w/c with BUE x 30 ft. Pt had difficulty performing effective strokes d/t UE weakness and RA. Pt stated vehemently "I don't like that," but could not expound to what she didn't like about it when asked, so therapist could not adjust activity. Pt transferred to day room and performed min A Stand pivot transfer with RW to nu step. Nustep at level 3, 2x5 min with 3 min rest break, RPE 13/20 on BORG scale. Pt ambulated back to room ~120 ft with min A and RW. Pt demoed unsafe AD use, flinging RW to the side despite having discussed maintaining proximity earlier in the session. When asked, pt confirms she is having trouble remembering things. Will discuss with team as speech consult could be indicated.  Pt returned to bed with supervision, doffed shoes with tot A and was left with all needs in reach and alarm active.   Therapy Documentation Precautions:  Precautions Precautions: Fall Precaution Comments: fatigues quickly Restrictions Weight Bearing Restrictions: No General:        Therapy/Group: Individual Therapy  CHIRSTINA HAAN 04/14/2021, 8:29 AM

## 2021-04-14 NOTE — Progress Notes (Signed)
Physical Therapy Session Note  Patient Details  Name: Carrie [name] MRN: [id number] Date of Birth: [birth year]  Today's Date: 04/14/2021 PT Individual Time: 1300-1359 PT Individual Time Calculation (min): 59 min   Short Term Goals: Week 1:  PT Short Term Goal 1 (Week 1): Patient will complete bed mobility with CGA consistently PT Short Term Goal 2 (Week 1): Patient will transfer bed <> wc with LRAD and CGA consistently PT Short Term Goal 3 (Week 1): Patient will ambulate >9ft with LRAD and CGA  Skilled Therapeutic Interventions/Progress Updates:  Pt received supine in bed, denied pain and required encouragement to participate in therapy. Pt was watching soap opera when therapist entered room and became agitated when therapist muted TV to speak to her. Pt requested to "finish watching TV" despite informing pt she was scheduled for therapy. After extensive encouragement, pt willing to "walk once". BP in supine at 106/76 mmHg. Pt performed supine <>sit EOB w/min A for trunk support, ignoring verbal cues to utilize bedrail. BP at EOB 96/75 mmHg, asymptomatic. Pt attempted 3 sit <>stands from EOB but unable to complete due to improper weight shift and attempting to pull herself up w/RW. Max verbal cues for hand placement and anterior weight shift and pt able to perform w/min A for trunk support and stabilization of RW. Pt ambulated 5' w/RW prior to requesting to sit, unable to encourage her to continue walking. Encouraged pt to practice WC mobility, max verbal cues for sequencing and hand placement. Pt very agitated and verbalized frustration with therapist. Informed pt of purpose of task and improving activity tolerance for DC home, which pt agreed with. Pt self-propelled ~30' very slowly w/S*, noted poor hand placement on rims and inefficient propulsion despite max cues. Pt then performed sit <>stand and ambulated 75' back to room w/min A and RW. Noted kyphotic posture, over reliance of BUEs, poor step  clearance bilaterally, and unsafe management of RW. Max verbal cues provided for upright posture and maintaining safe distance to RW, pt unphased by cues. Noted one moderate LOB episode anteriorly while attempting to turn, requiring mod A to prevent fall. Pt tends to step outside of walker while turning despite cues. Pt's sister present near end of session and was able to encourage pt to sit in recliner, despite pt being adamant against it. Educated pt on importance of sitting up throughout day to improve stamina and return to PLOF, pt verbalized understanding. Pt performed sit <>stand and ambulated 15' to recliner w/min A and RW and was left seated in recliner in room, all needs in reach.   Therapy Documentation Precautions:  Precautions Precautions: Fall Precaution Comments: fatigues quickly Restrictions Weight Bearing Restrictions: No   Therapy/Group: Individual Therapy Jill Alexanders Alper Guilmette, PT, DPT  04/14/2021, 7:59 AM

## 2021-04-14 NOTE — Progress Notes (Signed)
PROGRESS NOTE   Subjective/Complaints:  Pt reports energy levels- she's exhausted- LBM yesterday- had hypotensive episode Friday and one over weekend- BP on low side and felt dizzy/lightheaded  TSH normal and Vit D a little low.   Got a call from nursing about pt's low BP today- will check labs in Am to see if dry and stop Norvasc.    ROS:  Pt denies SOB, abd pain, CP, N/V/C/D, and vision changes   Objective:   No results found. No results for input(s): WBC, HGB, HCT, PLT in the last 72 hours.  No results for input(s): NA, K, CL, CO2, GLUCOSE, BUN, CREATININE, CALCIUM in the last 72 hours.   Intake/Output Summary (Last 24 hours) at 04/14/2021 1854 Last data filed at 04/14/2021 1818 Gross per 24 hour  Intake 300 ml  Output --  Net 300 ml        Physical Exam: Vital Signs Blood pressure (!) 88/70, pulse 99, temperature 97.6 F (36.4 C), temperature source Oral, resp. rate 17, height 5\' 5"  (1.651 m), weight 72.5 kg, SpO2 96 %.    General: awake, alert, appropriate,appears tired/fatigued; sitting up at sink with OT;  NAD HENT: conjugate gaze; oropharynx moist CV: regular rate- borderline high; no JVD Pulmonary: CTA B/L; no W/R/R- good air movement GI: soft, NT, ND, (+)BS Psychiatric: appropriate- flat, very flat Neurological: Ox3  Musculoskeletal:     Cervical back: Normal range of motion. No rigidity.     Comments: 4+/5 in arms proximal and distally B/L HF 4-/5; KE 4/5 DF/PF 4+/5 B/L  No swollen joints seen on exam  Skin:    Comments: Intact to light touch on exam B/L  Neurological:     Comments: Patient is alert.  Mood is a bit flat.  Light touch intact x all 4 extremities.    Assessment/Plan: 1. Functional deficits which require 3+ hours per day of interdisciplinary therapy in a comprehensive inpatient rehab setting. Physiatrist is providing close team supervision and 24 hour management of active  medical problems listed below. Physiatrist and rehab team continue to assess barriers to discharge/monitor patient progress toward functional and medical goals  Care Tool:  Bathing    Body parts bathed by patient: Right arm, Left arm, Chest, Abdomen, Face, Right upper leg, Left upper leg, Front perineal area, Buttocks   Body parts bathed by helper: Right lower leg, Left lower leg     Bathing assist Assist Level: Minimal Assistance - Patient > 75%     Upper Body Dressing/Undressing Upper body dressing   What is the patient wearing?: Pull over shirt    Upper body assist Assist Level: Set up assist    Lower Body Dressing/Undressing Lower body dressing      What is the patient wearing?: Pants     Lower body assist Assist for lower body dressing: Moderate Assistance - Patient 50 - 74%     Toileting Toileting    Toileting assist Assist for toileting: Minimal Assistance - Patient > 75%     Transfers Chair/bed transfer  Transfers assist     Chair/bed transfer assist level: Minimal Assistance - Patient > 75%     Locomotion Ambulation  Ambulation assist      Assist level: Minimal Assistance - Patient > 75% Assistive device: Walker-rolling Max distance: 75'   Walk 10 feet activity   Assist     Assist level: Minimal Assistance - Patient > 75% Assistive device: Walker-rolling   Walk 50 feet activity   Assist Walk 50 feet with 2 turns activity did not occur: Safety/medical concerns  Assist level: Minimal Assistance - Patient > 75% Assistive device: Walker-rolling    Walk 150 feet activity   Assist Walk 150 feet activity did not occur: Safety/medical concerns         Walk 10 feet on uneven surface  activity   Assist Walk 10 feet on uneven surfaces activity did not occur: Safety/medical concerns         Wheelchair     Assist Is the patient using a wheelchair?: Yes Type of Wheelchair: Manual    Wheelchair assist level: Total  Assistance - Patient < 25% Max wheelchair distance: 150    Wheelchair 50 feet with 2 turns activity    Assist        Assist Level: Total Assistance - Patient < 25%   Wheelchair 150 feet activity     Assist      Assist Level: Total Assistance - Patient < 25%   Blood pressure (!) 88/70, pulse 99, temperature 97.6 F (36.4 C), temperature source Oral, resp. rate 17, height 5\' 5"  (1.651 m), weight 72.5 kg, SpO2 96 %.    Medical Problem List and Plan: 1.  Debility secondary to generalized pain/rheumatoid arthritis.  Follow-up outpatient rheumatology services Dr .  Prednisone taper as indicated             -patient may  shower             -ELOS/Goals: 5-7 days mod I  Con't PT and OT- 2.  Antithrombotics: -DVT/anticoagulation:  Pharmaceutical: Lovenox             -antiplatelet therapy: n/a 3. Pain Management: Tramadol as needed  11/6- on stable prednisone dose- pain controlled- pt has moon facies as side effect, will reduce to 5mg  in am   11.7- no change in pain- con't regimen 4. Mood: Wellbutrin 300 mg daily, Lexapro 20 mg daily             -antipsychotic agents: N/A 5. Neuropsych: This patient is capable of making decisions on her own behalf. 6. Skin/Wound Care: Routine skin checks 7. Fluids/Electrolytes/Nutrition: Routine in and outs with follow-up chemistries 8.  Hypertension.  Norvasc 5 mg daily, lisinopril 30 mg daily.  Monitor with increased mobility   Vitals:   04/14/21 1345 04/14/21 1430  BP: 99/76 (!) 88/70  Pulse:    Resp:    Temp:    SpO2:    Controlled low nl this am , cont to monitor, enc po fluids   11/7- will stop Norvasc 5 mg daily as of Tuesday and recheck labs in Am to see if needs IVFs 9.  Elevated LFTs/transaminitis- as well as CK.  Improved.  Follow-up chemistries  11/2- coming down- is 60/50 now- 10/24 was 150/135- so improving- con't to monitor 10.  Hyperlipidemia.  Lipitor held due to elevated LFTs 11. Poor energy  11/3- will check  TSH and Vit D levels in AM- could be causing 11/6 vit D level mildly reduced will supplement 1000IU daily  12. Hx of depression  11/3- on wellbutrin and Lexapro- high doses- will con't  LOS: 6 days A FACE TO FACE EVALUATION WAS PERFORMED  Carrie [name] 04/14/2021, 6:54 PM

## 2021-04-15 LAB — COMPREHENSIVE METABOLIC PANEL
ALT: 41 U/L (ref 0–44)
AST: 54 U/L — ABNORMAL HIGH (ref 15–41)
Albumin: 2.4 g/dL — ABNORMAL LOW (ref 3.5–5.0)
Alkaline Phosphatase: 52 U/L (ref 38–126)
Anion gap: 8 (ref 5–15)
BUN: 14 mg/dL (ref 8–23)
CO2: 17 mmol/L — ABNORMAL LOW (ref 22–32)
Calcium: 8.9 mg/dL (ref 8.9–10.3)
Chloride: 108 mmol/L (ref 98–111)
Creatinine, Ser: 1.01 mg/dL — ABNORMAL HIGH (ref 0.44–1.00)
GFR, Estimated: >60 mL/min (ref >60)
Glucose, Bld: 85 mg/dL (ref 70–99)
Potassium: 3.9 mmol/L (ref 3.5–5.1)
Sodium: 133 mmol/L — ABNORMAL LOW (ref 135–145)
Total Bilirubin: 1.1 mg/dL (ref 0.3–1.2)
Total Protein: 5.7 g/dL — ABNORMAL LOW (ref 6.5–8.1)

## 2021-04-15 LAB — CBC WITH DIFFERENTIAL/PLATELET
Abs Immature Granulocytes: 0.03 10*3/uL (ref 0.00–0.07)
Basophils Absolute: 0 10*3/uL (ref 0.0–0.1)
Basophils Relative: 0 %
Eosinophils Absolute: 0 10*3/uL (ref 0.0–0.5)
Eosinophils Relative: 0 %
HCT: 36.2 % (ref 36.0–46.0)
Hemoglobin: 12.3 g/dL (ref 12.0–15.0)
Immature Granulocytes: 1 %
Lymphocytes Relative: 13 %
Lymphs Abs: 0.6 10*3/uL — ABNORMAL LOW (ref 0.7–4.0)
MCH: 31.5 pg (ref 26.0–34.0)
MCHC: 34 g/dL (ref 30.0–36.0)
MCV: 92.6 fL (ref 80.0–100.0)
Monocytes Absolute: 0.3 10*3/uL (ref 0.1–1.0)
Monocytes Relative: 7 %
Neutro Abs: 3.7 10*3/uL (ref 1.7–7.7)
Neutrophils Relative %: 79 %
Platelets: 218 10*3/uL (ref 150–400)
RBC: 3.91 MIL/uL (ref 3.87–5.11)
RDW: 14.9 % (ref 11.5–15.5)
WBC: 4.7 10*3/uL (ref 4.0–10.5)
nRBC: 0 % (ref 0.0–0.2)

## 2021-04-15 MED ORDER — LISINOPRIL 5 MG PO TABS
5.0000 mg | ORAL_TABLET | Freq: Every day | ORAL | Status: DC
  Administered 2021-04-16: 5 mg via ORAL
  Filled 2021-04-15: qty 1

## 2021-04-15 NOTE — Progress Notes (Signed)
Physical Therapy Session Note  Patient Details  Name: Carrie [name] MRN: [id number] Date of Birth: 09-10-1960  Today's Date: 04/15/2021 PT Individual Time: 1310-1400 PT Individual Time Calculation (min): 50 min   Short Term Goals: Week 1:  PT Short Term Goal 1 (Week 1): Patient will complete bed mobility with CGA consistently PT Short Term Goal 2 (Week 1): Patient will transfer bed <> wc with LRAD and CGA consistently PT Short Term Goal 3 (Week 1): Patient will ambulate >64ft with LRAD and CGA  Skilled Therapeutic Interventions/Progress Updates:    Pt received in bed with her sister present, requesting to rest for a few minutes. Therapist returned after 10 min and pt was agreeable to treatment. Ted hose and acewraps in place throughout session. Supine>sit with CGA. Stand pivot transfer to w/c with min A for balance and cues to recall technique. BP noted to be 99/74 while seated EOB. Pt agreeable to walk with RW. Gait 2 x 75 ft with self initiated seated rest break. Pt demoes short step length, trunk flexion, with min improvement after VC. Nustep at level 1 x 6 min with 2 seated rest breaks. Discussed energy conservation strategies during rest breaks. Pt performed Stand pivot transfer back to w/c with mod A d/t becoming momentarily disoriented upon standing but recovered quickly upon sitting. Pt returned to room and performed ambulatory transfer to recliner with RW and min A. No further incidence of disorientation. Pt remained in recliner and was left with all needs in reach and alarm active.   Therapy Documentation Precautions:  Precautions Precautions: Fall Precaution Comments: fatigues quickly Restrictions Weight Bearing Restrictions: No General:   Vital Signs:  Pain:   Mobility:   Locomotion :    Trunk/Postural Assessment :    Balance:   Exercises:   Other Treatments:      Therapy/Group: Individual Therapy  Carrie [name] 04/15/2021, 1:42 PM

## 2021-04-15 NOTE — Progress Notes (Signed)
Patient ID: SHANAVIA MAKELA, female   DOB: 04/20/1961, 60 y.o.   MRN: [id number]  Met with pt and sister-Sandra was present-pt gave permission to speak in front of her. Gave her team conference update of supervision level goals and target discharge of 11/23. Pt feels better than a few days ago but still needs encouragement to participate and push herself in therapies.[name] reports she has declined for months at home prior to coming into the hospital. Discussed she has memory and cognitive issues and this would make her a safety risk at discharge. [name] will be checking on her but will not be staying with her so pt needs to push herself and get moving. Discussed a depression component and will have neuro-psych see while here. Both in agreement with this. [name] reports she was suppose to see neurologist but is here so will miss appointment. Will continue to work on a safe discharge, she has no coverage for SNF

## 2021-04-15 NOTE — Progress Notes (Signed)
Occupational Therapy Session Note  Patient Details  Name: Carrie [name] MRN: [id number] Date of Birth: [birth year]  Today's Date: 04/15/2021 OT Individual Time: 1000-1054 OT Individual Time Calculation (min): 54 min    Short Term Goals: Week 1:  OT Short Term Goal 1 (Week 1): Pt will indepedently verbalize at least 2 energy conservation strategies to implement into ADL. OT Short Term Goal 2 (Week 1): Pt will don pants with CGA and AE PRN. OT Short Term Goal 3 (Week 1): Pt will complete shower transfer with CGA and LRAD.   Skilled Therapeutic Interventions/Progress Updates:    Pt greeted at time of session supine in bed resting with TEDS and ACE already on and pt reporting not able to do much this morning 2/2 BP issues. Checked BP supine at 106/72 w/ HR at 97, sitting EOB 94/74 w/ HR of 102, static standing 84/70 w/ HR at 112. Bed mobility Mod A to sit EOB and walked short distance to sink level at wheelchair and completed UB/LB bathing and dressing. UB bathe/dress with Min A overall 2/2 weakness and fatigue limiting her ability to don/doff shirt. LB bathe and dress with Mod A needing assist for buttocks in standing, assist to thread feet into tight leggings and don over buttocks. Pt did attempt but unable to fully get up 2/2 weakness and tight fit. Extensive rest breaks needed throughout task. Stand pivot back to bed Min/CGA and sit > supine Mod for LE management. Alarm on call bell in reach.    Therapy Documentation Precautions:  Precautions Precautions: Fall Precaution Comments: fatigues quickly Restrictions Weight Bearing Restrictions: No    Therapy/Group: Individual Therapy  Erasmo Score 04/15/2021, 7:25 AM

## 2021-04-15 NOTE — Progress Notes (Signed)
Occupational Therapy Session Note  Patient Details  Name: Carrie [name] MRN: [id number] Date of Birth: 06-May-1961  Today's Date: 04/15/2021 OT Individual Time: 1451-1537 OT Individual Time Calculation (min): 46 min    Short Term Goals: Week 1:  OT Short Term Goal 1 (Week 1): Pt will indepedently verbalize at least 2 energy conservation strategies to implement into ADL. OT Short Term Goal 2 (Week 1): Pt will don pants with CGA and AE PRN. OT Short Term Goal 3 (Week 1): Pt will complete shower transfer with CGA and LRAD.  Skilled Therapeutic Interventions/Progress Updates:  Pt greeted on toilet with pts sister present. Pt noted to be diaphoretic frantically requesting to get out of the bathroom ( ? Some anxiety/panic attack issues as pt has dealt with anxiety in the past). Pt completed sit<>stand with MIN A with cues for hand placement from toilet. CGA for ambulatory transfer back to EOB with rw, pt reports feeling lightheaded needing to return to supine, MOD A to return to supine needing assist to elevated BLEs to EOB. BP checked from supine with TEDS and ace wraps 110/83. Worked on compensatory methods for impaird [facility] such as using dycem to assist with opening items, pt able to open cereal with increased time and MOD verbal cue for hand placement. Pt able to open flaps on milk carton but unable to create a hole ( pt reports she doesn't use milk that opens that way at home). Pt completed self feeding with supervision, noted tremors in BUEs, education provided on stabilizing elbows to compensate for tremors however pt reprots pain in elbows and unable to stabilze elbows on side table. Worked on [facility] with theraputty with pt completing series of FMC movements that included lateral pincer grasp, digit flexion/ extension, MP flexion/extension. Pt left supine in bed with bed alarm activated and all needs within reach.                Therapy Documentation Precautions:  Precautions Precautions:  Fall Precaution Comments: fatigues quickly Restrictions Weight Bearing Restrictions: No   Pain: pt reports no pain during session     Therapy/Group: Individual Therapy  [name] 04/15/2021, 3:50 PM

## 2021-04-15 NOTE — Progress Notes (Signed)
Occupational Therapy Session Note  Patient Details  Name: Carrie [name] MRN: [id number] Date of Birth: May 16, 1961  Today's Date: 04/15/2021 OT Individual Time: 0017-4944 OT Individual Time Calculation (min): 58 min    Short Term Goals: Week 1:  OT Short Term Goal 1 (Week 1): Pt will indepedently verbalize at least 2 energy conservation strategies to implement into ADL. OT Short Term Goal 2 (Week 1): Pt will don pants with CGA and AE PRN. OT Short Term Goal 3 (Week 1): Pt will complete shower transfer with CGA and LRAD.  Skilled Therapeutic Interventions/Progress Updates:  Pt greeted supine in bed  agreeable to OT intervention. Session focus on BADL reeducation. Pt completed bed mobility with MIN A needing assist to elevate trunk into sitting. Pt completed sit<>stand from EOB with CGA, ambulatory toilet transfer with rw from EOB>bathroom with CGA. Supervision for 3/3 toileting tasks. Pt needed MIN verbal cues for hand placement when attempting to sit <>stand from toilet, CGA to stand once RUE positioned on grab bar. Pt completed ambulatory transfer out of bathroom to EOB reporting fatigue needing to sit EOB and rest. S/u assist for hand hygiene and UB grooming tasks from EOB. Pt ate breakfast while resting with OTA providing education on energy conservation strategies for home. Pt reports concern for being able to get groceries and provide herself with meals as pt lives alone, education provided on meals on wheels program as well as low tech options for meals as well as [employer] grocery delievery. Dr. Berline Chough enter during session pt reports feeling lightheaded needing to lay down, MIN A to stand from EOB with cues for hand placement to take lateral steps to [facility]. MOD A to return to supine needing assist to elevate BLEs. BP from supine 99/79 HR 100 SpO2 99.   Dr. Berline Chough verbally ordered TEDs for OOB and ace wraps during therapies. Remainder of session to focus on donning TEDs/ ace wraps. BP from supine  96/73.pt left supine in bed with bed alarm activated and all needs within reach.                    Therapy Documentation Precautions:  Precautions Precautions: Fall Precaution Comments: fatigues quickly Restrictions Weight Bearing Restrictions: No  Pain: pt reports no pain during session     Therapy/Group: Individual Therapy  Pollyann Glen [facility] 04/15/2021, 10:30 AM

## 2021-04-15 NOTE — Progress Notes (Signed)
PROGRESS NOTE   Subjective/Complaints:  Pt reports feeling lightheaded and dizzy when sitting EOB.  Also hands trembling- and started when admitted to hospital/started on prednisone- makes it hard to grip things, eat, etc.  Also, still exhausted.- Throat a little sore.  LBM yesterday- medium sized.  Has been drinking ~ 4 cups/day.  D/w therapy since BP LAYING after the dizziness was 99/57 and HR 100- to ACE wraps/TEDs to knees B/L.     ROS:  Pt denies SOB, abd pain, CP, N/V/C/D, and vision changes    Objective:   No results found. Recent Labs    04/15/21 0513  WBC 4.7  HGB 12.3  HCT 36.2  PLT 218    Recent Labs    04/15/21 0513  NA 133*  K 3.9  CL 108  CO2 17*  GLUCOSE 85  BUN 14  CREATININE 1.01*  CALCIUM 8.9     Intake/Output Summary (Last 24 hours) at 04/15/2021 0910 Last data filed at 04/14/2021 1818 Gross per 24 hour  Intake 180 ml  Output --  Net 180 ml        Physical Exam: Vital Signs Blood pressure 103/74, pulse 94, temperature (!) 97.5 F (36.4 C), resp. rate 16, height 5\' 5"  (1.651 m), weight 72.5 kg, SpO2 95 %.     General: awake, alert, appropriate, sitting EOB; turned pale when c/o dizziness;  BP 99/57;  NAD HENT: conjugate gaze; oropharynx moist CV: borderline tachycardic rate; no JVD Pulmonary: CTA B/L; no W/R/R- good air movement GI: soft, NT, ND, (+)BS- hypoactive Psychiatric: appropriate; appears exhausted Neurological: Ox3  Musculoskeletal:     Cervical back: Normal range of motion. No rigidity.     Comments: 4+/5 in arms proximal and distally B/L HF 4-/5; KE 4/5 DF/PF 4+/5 B/L  No swollen joints seen on exam  Skin:    Comments: Intact to light touch on exam B/L  Neurological:     Comments: Patient is alert.  Mood is a bit flat.  Light touch intact x all 4 extremities.    Assessment/Plan: 1. Functional deficits which require 3+ hours per day of  interdisciplinary therapy in a comprehensive inpatient rehab setting. Physiatrist is providing close team supervision and 24 hour management of active medical problems listed below. Physiatrist and rehab team continue to assess barriers to discharge/monitor patient progress toward functional and medical goals  Care Tool:  Bathing    Body parts bathed by patient: Right arm, Left arm, Chest, Abdomen, Face, Right upper leg, Left upper leg, Front perineal area, Buttocks   Body parts bathed by helper: Right lower leg, Left lower leg     Bathing assist Assist Level: Minimal Assistance - Patient > 75%     Upper Body Dressing/Undressing Upper body dressing   What is the patient wearing?: Pull over shirt    Upper body assist Assist Level: Set up assist    Lower Body Dressing/Undressing Lower body dressing      What is the patient wearing?: Pants     Lower body assist Assist for lower body dressing: Moderate Assistance - Patient 50 - 74%     Toileting Toileting    Toileting assist Assist  for toileting: Minimal Assistance - Patient > 75%     Transfers Chair/bed transfer  Transfers assist     Chair/bed transfer assist level: Minimal Assistance - Patient > 75%     Locomotion Ambulation   Ambulation assist      Assist level: Minimal Assistance - Patient > 75% Assistive device: Walker-rolling Max distance: 75'   Walk 10 feet activity   Assist     Assist level: Minimal Assistance - Patient > 75% Assistive device: Walker-rolling   Walk 50 feet activity   Assist Walk 50 feet with 2 turns activity did not occur: Safety/medical concerns  Assist level: Minimal Assistance - Patient > 75% Assistive device: Walker-rolling    Walk 150 feet activity   Assist Walk 150 feet activity did not occur: Safety/medical concerns         Walk 10 feet on uneven surface  activity   Assist Walk 10 feet on uneven surfaces activity did not occur: Safety/medical  concerns         Wheelchair     Assist Is the patient using a wheelchair?: Yes Type of Wheelchair: Manual    Wheelchair assist level: Total Assistance - Patient < 25% Max wheelchair distance: 150    Wheelchair 50 feet with 2 turns activity    Assist        Assist Level: Total Assistance - Patient < 25%   Wheelchair 150 feet activity     Assist      Assist Level: Total Assistance - Patient < 25%   Blood pressure 103/74, pulse 94, temperature (!) 97.5 F (36.4 C), resp. rate 16, height 5\' 5"  (1.651 m), weight 72.5 kg, SpO2 95 %.    Medical Problem List and Plan: 1.  Debility secondary to generalized pain/rheumatoid arthritis.  Follow-up outpatient rheumatology services Dr .  Prednisone taper as indicated             -patient may  shower             -ELOS/Goals: 5-7 days mod I  Con't PT and OT- team conference today 2.  Antithrombotics: -DVT/anticoagulation:  Pharmaceutical: Lovenox             -antiplatelet therapy: n/a 3. Pain Management: Tramadol as needed  11/6- on stable prednisone dose- pain controlled- pt has moon facies as side effect, will reduce to 5mg  in am   11.7- no change in pain- con't regimen 4. Mood: Wellbutrin 300 mg daily, Lexapro 20 mg daily             -antipsychotic agents: N/A 5. Neuropsych: This patient is capable of making decisions on her own behalf. 6. Skin/Wound Care: Routine skin checks 7. Fluids/Electrolytes/Nutrition: Routine in and outs with follow-up chemistries 8.  Hypertension.  Norvasc 5 mg daily, lisinopril 30 mg daily.  Monitor with increased mobility   Vitals:   04/14/21 2007 04/15/21 0424  BP: 97/75 103/74  Pulse: 93 94  Resp: 16 16  Temp: 99.2 F (37.3 C) (!) 97.5 F (36.4 C)  SpO2: 99% 95%    11/7- will stop Norvasc 5 mg daily as of Tuesday and recheck labs in Am to see if needs IVFs  11/8- will decrease Lisinopril from 20 mg daily to 5 mg daily and start ACE wraps and TEDs to knee B/L- also, push  fluids- explained to pt needs 6-8 cups/water/day.  9.  Elevated LFTs/transaminitis- as well as CK.  Improved.  Follow-up chemistries  11/2- coming down- is 60/50 now- 10/24  was 150/135- so improving- con't to monitor  11/8- down to 50/41- con't monitoring 10.  Hyperlipidemia.  Lipitor held due to elevated LFTs 11. Poor energy  11/3- will check TSH and Vit D levels in AM- could be causing 11/6 vit D level mildly reduced will supplement 1000IU daily  12. Hx of depression  11/3- on wellbutrin and Lexapro- high doses- will con't 13. Hypotension  11/8- dramatically decrease lisinopril and add ACE wraps/TEDs and have pt drink more- per CMP, not dry, so wait on IVFs.      I spent a total of 41 minutes on total care today- >50% coordination of care- team conference as well as dizziness episode so helped PT to care for pt. Also speaking with PT and nursing.     LOS: 7 days A FACE TO FACE EVALUATION WAS PERFORMED  Carrie [name] 04/15/2021, 9:10 AM

## 2021-04-15 NOTE — Patient Care Conference (Signed)
Inpatient RehabilitationTeam Conference and Plan of Care Update Date: 04/15/2021   Time: 11:49 AM    Patient Name: Carrie [name]      Medical Record Number: [id number]  Date of Birth: [birth year] Sex: Female         Room/Bed: 4W24C/4W24C-01 Payor Info: Payor: [employer]UE [employer] / Plan: BCBS/[employer] PPO / Product Type: *No Product type* /    Admit Date/Time:  04/08/2021 11:44 AM  Primary Diagnosis:  Debility  Hospital Problems: Principal Problem:   Debility Active Problems:   Rheumatoid arthritis flare (HCC)   Fall (on) (from) other stairs and steps, subsequent encounter    Expected Discharge Date: Expected Discharge Date: 04/30/21  Team Members Present: Physician leading conference: Dr. Genice Rouge Social Worker Present: Dossie Der, LCSW Nurse Present: Kennyth Arnold, RN PT Present: Bernie Covey, PT OT Present: Earleen Newport, OT PPS Coordinator present : Fae Pippin, SLP     Current Status/Progress Goal Weekly Team Focus  Bowel/Bladder   Continent of B/B. LBM 04/13/21  Remain continent.      Swallow/Nutrition/ Hydration             ADL's   Mod LB bathe and dress (unable to finishing bathing shower level 2/2 fatigue), decline from eval, Min/Mod toileting, fatigue limiting, cognition?  Mod I lives alone (will downgrade to Supervision)  standing balance/tolerance, OOB activity, UB strength, ADL retraining, funcitonal mobility, energy conservation   Mobility   min A gait with RW up to 120 ft, max A w/c mobility, min A transfers, supervision-min bed mobility. Pt is limited by poor working memory and depressed mental state, could be appropriate for neuropsych and speech.  mod I/supervision, unlikely to meet with current level participation  increasing activity level, gait, endurance   Communication             Safety/Cognition/ Behavioral Observations            Pain   Denies Pain  Remain pain free.  Assess Q shift and prn.   Skin   Non pressure injury to  left bottock cheek.  Prevent new skin breakdowns.  Assess Q shift and prn.     Discharge Planning:  Home with family members checking on her, will place on neuro-psych list   Team Discussion: Decreased Lisinopril to 5 mg, decreased Prednisone to 5 mg. Added ace wraps and Ted hose. Push fluids, not eating well. Unsure if was compliant with medications at home. Continent B/B. Non-pressure wound to buttocks. Lives alone with family checking in on her. Order neuro-psych. Memory/cognition issues, place order for SLP. Mod assist wit ADL's, decline in function a large barrier. Supervision/mod I goals not realistic, will downgrade goals. Patient on target to meet rehab goals: no, therapy to downgrade to more realistic goals.  *See Care Plan and progress notes for long and short-term goals.   Revisions to Treatment Plan:  Adjusting medications, SLP order, Neuro-psych order  Teaching Needs: Family education, medication/pain management, skin/wound care, safety awareness, transfer training, etc.  Current Barriers to Discharge: Decreased caregiver support, Home enviroment access/layout, Wound care, Lack of/limited family support, Insurance for SNF coverage, Weight, Medication compliance, Behavior, Nutritional means, and pain.  Possible Resolutions to Barriers: Family education Neuro-psych order SLP order Provide nutritional support HH/Outpatient therapy      Medical Summary Current Status: new RA and fall- continent B/B- LBM 11/5- no pain but tells nursing 8/10- throat pain?; non pressure wound to buttocks- albumin is dorpping down to 2.4; hypotension is significant  Barriers to Discharge: Behavior;Decreased family/caregiver support;Home enviroment access/layout;Medical stability;Wound care;Weight  Barriers to Discharge Comments: sister will help at home as needed- Possible Resolutions to [employer]: barrier- poor cognition- decline in function- fatigue is really limiting- will order  SLP- not eating well- goals- need to downgrade to min A-Supervision- cannot participate due to hypotension and fatigue- decreasing prednisone- to 5mg  daily- decreased BP meds dramatically- and start ACE wraps and TEDs- d/c- 11/23- cannot d/c to SNF- due to insurance   Continued Need for Acute Rehabilitation Level of Care: The patient requires daily medical management by a physician with specialized training in physical medicine and rehabilitation for the following reasons: Direction of a multidisciplinary physical rehabilitation program to maximize functional independence : Yes Medical management of patient stability for increased activity during participation in an intensive rehabilitation regime.: Yes Analysis of laboratory values and/or radiology reports with any subsequent need for medication adjustment and/or medical intervention. : Yes   I attest that I was present, lead the team conference, and concur with the assessment and plan of the team.   12/23 04/15/2021, 6:02 PM

## 2021-04-16 ENCOUNTER — Inpatient Hospital Stay (HOSPITAL_COMMUNITY): Payer: Federal, State, Local not specified - PPO

## 2021-04-16 DIAGNOSIS — J9811 Atelectasis: Secondary | ICD-10-CM | POA: Diagnosis not present

## 2021-04-16 DIAGNOSIS — R5381 Other malaise: Secondary | ICD-10-CM | POA: Diagnosis not present

## 2021-04-16 DIAGNOSIS — F322 Major depressive disorder, single episode, severe without psychotic features: Secondary | ICD-10-CM | POA: Diagnosis not present

## 2021-04-16 LAB — COMPREHENSIVE METABOLIC PANEL
ALT: 42 U/L (ref 0–44)
AST: 60 U/L — ABNORMAL HIGH (ref 15–41)
Albumin: 2.4 g/dL — ABNORMAL LOW (ref 3.5–5.0)
Alkaline Phosphatase: 63 U/L (ref 38–126)
Anion gap: 9 (ref 5–15)
BUN: 14 mg/dL (ref 8–23)
CO2: 20 mmol/L — ABNORMAL LOW (ref 22–32)
Calcium: 9 mg/dL (ref 8.9–10.3)
Chloride: 104 mmol/L (ref 98–111)
Creatinine, Ser: 1.1 mg/dL — ABNORMAL HIGH (ref 0.44–1.00)
GFR, Estimated: 57 mL/min — ABNORMAL LOW (ref >60)
Glucose, Bld: 99 mg/dL (ref 70–99)
Potassium: 4 mmol/L (ref 3.5–5.1)
Sodium: 133 mmol/L — ABNORMAL LOW (ref 135–145)
Total Bilirubin: 0.8 mg/dL (ref 0.3–1.2)
Total Protein: 5.8 g/dL — ABNORMAL LOW (ref 6.5–8.1)

## 2021-04-16 LAB — URINALYSIS, ROUTINE W REFLEX MICROSCOPIC
Bilirubin Urine: NEGATIVE
Glucose, UA: NEGATIVE mg/dL
Hgb urine dipstick: NEGATIVE
Ketones, ur: 5 mg/dL — AB
Nitrite: NEGATIVE
Protein, ur: NEGATIVE mg/dL
Specific Gravity, Urine: 1.018 (ref 1.005–1.030)
pH: 6 (ref 5.0–8.0)

## 2021-04-16 LAB — CBC WITH DIFFERENTIAL/PLATELET
Abs Immature Granulocytes: 0.02 10*3/uL (ref 0.00–0.07)
Basophils Absolute: 0 10*3/uL (ref 0.0–0.1)
Basophils Relative: 0 %
Eosinophils Absolute: 0 10*3/uL (ref 0.0–0.5)
Eosinophils Relative: 0 %
HCT: 36 % (ref 36.0–46.0)
Hemoglobin: 12.6 g/dL (ref 12.0–15.0)
Immature Granulocytes: 0 %
Lymphocytes Relative: 10 %
Lymphs Abs: 0.6 10*3/uL — ABNORMAL LOW (ref 0.7–4.0)
MCH: 31.7 pg (ref 26.0–34.0)
MCHC: 35 g/dL (ref 30.0–36.0)
MCV: 90.5 fL (ref 80.0–100.0)
Monocytes Absolute: 0.4 10*3/uL (ref 0.1–1.0)
Monocytes Relative: 6 %
Neutro Abs: 4.7 10*3/uL (ref 1.7–7.7)
Neutrophils Relative %: 84 %
Platelets: 228 10*3/uL (ref 150–400)
RBC: 3.98 MIL/uL (ref 3.87–5.11)
RDW: 14.6 % (ref 11.5–15.5)
WBC: 5.7 10*3/uL (ref 4.0–10.5)
nRBC: 0 % (ref 0.0–0.2)

## 2021-04-16 IMAGING — DX DG CHEST 1V PORT
1 series · 1 of 1 positions shown · non-contrast
Comparison: [DATE]

CLINICAL DATA: Decreased mentation, concern for infection

EXAM:
PORTABLE CHEST 1 VIEW

[chest]
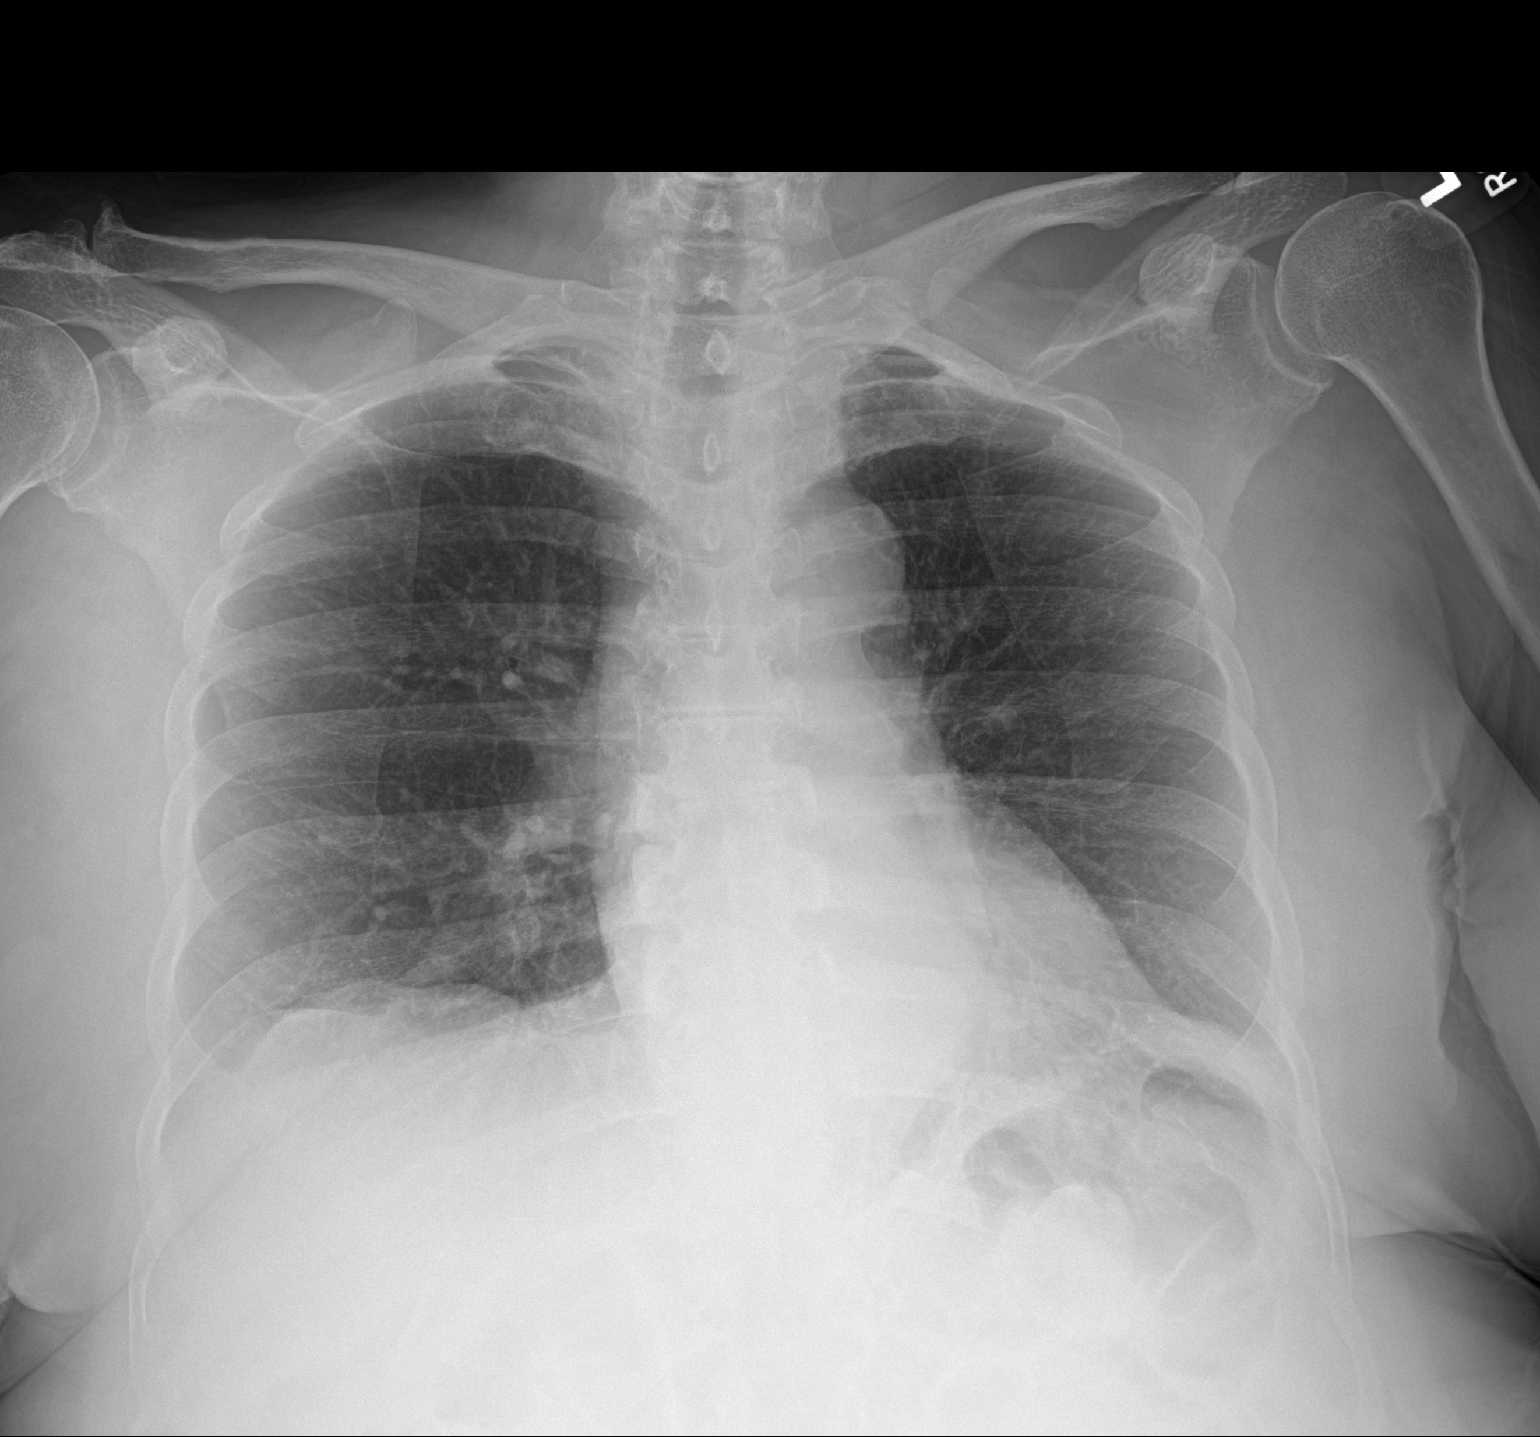

[1 of 1 positions shown; findings below may reference images not displayed]

FINDINGS: Low lung volumes. Unchanged cardiac and mediastinal contours. No new
focal pulmonary opacity. Bibasilar atelectasis. No definite pleural
effusion. No acute osseous abnormality.
IMPRESSION: No acute cardiopulmonary process.

## 2021-04-16 MED ORDER — ENSURE ENLIVE PO LIQD
237.0000 mL | Freq: Two times a day (BID) | ORAL | Status: DC
  Administered 2021-04-16 – 2021-04-20 (×8): 237 mL via ORAL

## 2021-04-16 NOTE — Progress Notes (Signed)
Physical Therapy Session Note  Patient Details  Name: CASS EDINGER MRN: [id number] Date of Birth: [birth year]  Today's Date: 04/16/2021 PT Missed Time: 60 Minutes Missed Time Reason: Patient fatigue;Patient unwilling to participate  Pt reclined in bed on arrival, stating she is too tired to participate. When offered bed level exercise, stated, "I am tired of all those." Pt also declined to get in wheelchair to leave room or go to the bathroom. Therapist attempted to follow up in the PM but pt was out of room for group session. Pt later told rec therapist that she did not recall this therapist coming into her room. Pt missed 60 min of scheduled PT.     FREEDA SPIVEY 04/16/2021, 4:03 PM

## 2021-04-16 NOTE — Evaluation (Signed)
Recreational Therapy Assessment and Plan  Patient Details  Name: Carrie [name] MRN: [id number] Date of Birth: 1960/06/13 Today's Date: 04/16/2021  Rehab Potential:  Good ELOS:  11/23\  Hospital Problem: Principal Problem:   Debility Active Problems:   Rheumatoid arthritis flare ([place])   Fall (on) (from) other stairs and steps, subsequent encounter     Past Medical History:      Past Medical History:  Diagnosis Date   Hyperlipidemia     Hypertension      Past Surgical History:       Past Surgical History:  Procedure Laterality Date   ABDOMINAL HYSTERECTOMY       REDUCTION MAMMAPLASTY          Assessment & Plan Clinical Impression: Patient is a 60 y.o. year old female with  history of hypertension, hyperlipidemia and depression, quit smoking 7 years ago.  Per chart review patient lives alone.  Two-level home 10 steps to entry.  Bed and bath on second level.  Independent prior to admission.  Presented to [facility]/20[facility] with recent fall 3 weeks ago after missing a step without loss of consciousness.  She also reported some generalized weakness and lightheadedness and dizziness on standing.  By report she had recently gone to the [place] urgent care about a week ago received Flexeril and ibuprofen.  Admission chemistries unremarkable except creatinine 1.03, AST 161, ALT 114, troponin negative, CK 638, lactic acid 1.8, TSH within normal limits, vitamin B12 7158, urinalysis negative nitrite, urine drug screen negative.  Cranial CT scan negative.  CT cervical spine unremarkable.  X-rays of hip and pelvis no acute osseous injury.  Echocardiogram with ejection fraction of 60 to 65% no wall motion abnormalities grade 1 diastolic dysfunction.  Ultrasound of the abdomen gallbladder distended by sludge no gross hepatic mass.  Work-up for possible rheumatoid arthritis positive ANA negative CCP mildly elevated ESR 26 and CRP 2.5 as well as elevated RA(101.4).  Outpatient  rheumatology Dr.Aryal curbside consulted recommend treatment with prednisone and taper.  CK improved to 226 as well as improved LFTs..  Subcutaneous Lovenox for DVT prophylaxis.  Tolerating a regular diet.  Therapy evaluations completed due to patient decreased functional mobility was admitted for a comprehensive rehab program.   Met with pt today to discuss TR services, leisure interests, activity analysis/modifications.  Pt presents with decreased activity tolerance, decreased functional mobility, decreased balance, decreased initiation, decreased problem solving, decreased safety awareness, decreased memory, and delayed processing Limiting pt's independence with leisure/community pursuits.  Plan  Min 1 TR session >20 minutes per week  Recommendations for other services: None   Discharge Criteria: Patient will be discharged from TR if patient refuses treatment 3 consecutive times without medical reason.  If treatment goals not met, if there is a change in medical status, if patient makes no progress towards goals or if patient is discharged from hospital.  The above assessment, treatment plan, treatment alternatives and goals were discussed and mutually agreed upon: by patient  Session Note:  Session 1:  NO c/o pain Pt participated in animal assisted activity bed level with supervision.  Pt appreciated this visit and was very conversational with pet partners team.   Session 2:  NO c/o Pt c/o of fatigue.   Pt participated in group education session addressing kitchen/home safety.  Education/discussion included energy conservation, fall hazards, fall recovery, community resources for fall recovery and meals on wheels.  Taraneh Metheney 04/16/2021, 4:09 PM

## 2021-04-16 NOTE — Progress Notes (Signed)
Occupational Therapy Session Note  Patient Details  Name: Carrie [name] MRN: [id number] Date of Birth: August 23, 1960  Today's Date: 04/16/2021 OT Individual Time: 1531-1540 OT Individual Time Calculation (min): 9 min  and Today's Date: 04/16/2021 OT Group Time: 1430-1530 OT Group Time Calculation (min): 60 min   Short Term Goals: Week 1:  OT Short Term Goal 1 (Week 1): Pt will indepedently verbalize at least 2 energy conservation strategies to implement into ADL. OT Short Term Goal 2 (Week 1): Pt will don pants with CGA and AE PRN. OT Short Term Goal 3 (Week 1): Pt will complete shower transfer with CGA and LRAD.  Skilled Therapeutic Interventions/Progress Updates:  Group session: Pt participated in group session with a focus on kitchen tasks/mobility, energy conservation strategies ( ECS), home mgmt tasks and safe kitchen set- up for DC. Pt reports she wants to be able to "cook in her kitchen." Discussed safe recommendations for navigating kitchen with RW. Discussed energy conservation strategies in relation to kitchen tasks such as using RW bag, rearranging frequently used kitchen items and eliminating potential fall risks in the kitchen. Pt able to ambulate in kitchen with Rw with MIN A but reports fatigue after ~ 24ft needing to sit down in w/c d/t fatigue. D/t increased fatigue discussed possibility of navigating kitchen from w/c level with pt reporting that a w/c would be able to fit into her kitchen. Education provided on rearranging items so that pt could access them from her w/c, pt agreeable. Education provided on always knowing where closest seat is when completing functional mobility. Issued pt educational handout to increase carryover about ECS and community re-entry. Pt transported back to room by this OTA. Individual session: once in room pt reports need to void bladder, MIN A for ambulatory transfer from w/c >bathroom with Rw. Pt required cues for hand placement during each sit<>stand  needing up to MOD A at times d/t fatigue. Education provided on compensatory method of using R hand on toilet paper dispenser to assist with ripping toilet paper d/t weakness in bilateral hands.Pt completed 3/3 toileting tasks with MIN A as pt did not notice that she did not pull her pants up all the way.  Pt completed ambulatory transfer from toilet>EOB with Rw and MIN A, pt reports fatigue unable to stand at sink to wash her hands. Set- up assist for hand hygiene from EOB. MOD A to elevate BLEs back to bed. Pt left supine in bed with bed alarm activated and all needs within reach.     Therapy Documentation Precautions:  Precautions Precautions: Fall Precaution Comments: fatigues quickly Restrictions Weight Bearing Restrictions: No  Pain: Pt reports no pain during session.    Therapy/Group: Individual Therapy and Group Therapy  [name] 04/16/2021, 4:20 PM

## 2021-04-16 NOTE — Progress Notes (Addendum)
Initial Nutrition Assessment  DOCUMENTATION CODES:   Not applicable  INTERVENTION:  Provide Ensure Enlive po BID, each supplement provides 350 kcal and 20 grams of protein.  Encourage adequate PO intake.   NUTRITION DIAGNOSIS:   Inadequate oral intake related to poor appetite as evidenced by per patient/family report.  GOAL:   Patient will meet greater than or equal to 90% of their needs  MONITOR:   PO intake, Supplement acceptance, Labs, Weight trends, Skin, I & O's  REASON FOR ASSESSMENT:   Consult Assessment of nutrition requirement/status  ASSESSMENT:   60 year old right-handed female with history of hypertension, hyperlipidemia initially presents after fall, generalized weakness and lightheadedness and dizziness. Rheumatoid arthritis positive. Therapy evaluations completed due to patient decreased functional mobility was admitted for a comprehensive rehab program.  Pt reports decreased appetite since admission which she suspects is related to mediation side effects. Pt reports eating well prior to admission with usual consumption of at least 3 meals a day. Meal completion has been 20-75%. Pt motivated to eat her food at meals to ensure adequate nutrition is met. Per weight records, pt with a 10% weight loss within a 3 month time frame, significant for time frame. RD to order nutritional supplements to aid in caloric and protein needs.   NUTRITION - FOCUSED PHYSICAL EXAM:  Flowsheet Row Most Recent Value  Orbital Region No depletion  Upper Arm Region No depletion  Thoracic and Lumbar Region No depletion  Buccal Region No depletion  Temple Region No depletion  Clavicle Bone Region No depletion  Clavicle and Acromion Bone Region No depletion  Scapular Bone Region Unable to assess  Dorsal Hand No depletion  Patellar Region No depletion  Anterior Thigh Region No depletion  Posterior Calf Region No depletion  Edema (RD Assessment) Mild  Hair Reviewed  Eyes Reviewed   Mouth Reviewed  Skin Reviewed  Nails Reviewed      Labs and medications reviewed.   Diet Order:   Diet Order             Diet regular Room service appropriate? Yes; Fluid consistency: Thin  Diet effective now                   EDUCATION NEEDS:   Not appropriate for education at this time  Skin:  Skin Assessment: Skin Integrity Issues: Skin Integrity Issues:: Other (Comment) Other: non-pressure wound to buttocks  Last BM:  11/6  Height:   Ht Readings from Last 1 Encounters:  04/08/21 _0  (1.651 m)    Weight:   Wt Readings from Last 1 Encounters:  04/08/21 72.5 kg   BMI:  Body mass index is 26.6 kg/m.  Estimated Nutritional Needs:   Kcal:  1800-2000  Protein:  85-100 grams  Fluid:  >/= 1.8 L/day  Corrin Parker, MS, RD, LDN RD pager number/after hours weekend pager number on Amion.

## 2021-04-16 NOTE — Progress Notes (Signed)
Redness to bottom is blanchable without open area. See flowsheet for assessment. Mylo Red, LPN

## 2021-04-16 NOTE — Evaluation (Signed)
Speech Language Pathology Assessment and Plan  Patient Details  Name: Carrie [name] MRN: [id number] Date of Birth: 1960-12-04  SLP Diagnosis: Cognitive Impairments  Rehab Potential: Good ELOS: 11/23    Today's Date: 04/16/2021 SLP Individual Time: 0805-0900 SLP Individual Time Calculation (min): 59 min   Hospital Problem: Principal Problem:   Debility Active Problems:   Rheumatoid arthritis flare ([place])   Fall (on) (from) other stairs and steps, subsequent encounter  Past Medical History:  Past Medical History:  Diagnosis Date   Hyperlipidemia    Hypertension    Past Surgical History:  Past Surgical History:  Procedure Laterality Date   ABDOMINAL HYSTERECTOMY     REDUCTION MAMMAPLASTY      Assessment / Plan / Recommendation  Carrie [name] is a 60 year old right-handed female with history of hypertension, hyperlipidemia and depression, quit smoking 7 years ago.  Per chart review patient lives alone.  Two-level home 10 steps to entry.  Bed and bath on second level.  Independent prior to admission.  Presented to [facility] long hospital 03/26/2021 with recent fall 3 weeks ago after missing a step without loss of consciousness.  She also reported some generalized weakness and lightheadedness and dizziness on standing.  By report she had recently gone to the [facility] urgent care about a week ago received Flexeril and ibuprofen.  Admission chemistries unremarkable except creatinine 1.03, AST 161, ALT 114, troponin negative, CK 638, lactic acid 1.8, TSH within normal limits, vitamin B12 7158, urinalysis negative nitrite, urine drug screen negative.  Cranial CT scan negative.  CT cervical spine unremarkable.  Tolerating a regular diet.  Therapy evaluations completed due to patient decreased functional mobility was admitted for a comprehensive rehab program. SLE was ordered by MD secondary to patient exhibiting memory and cognitive issues during course of CIR.   Clinical Impression  Patient presents with a mild cognitive impairment as per this testing. She was in the 'mild impairment' range for the Memory subtest on Cognistat test, moderate impairment for Memory Registration subtest. She gave accurate responses to judgement/reasoning questions but did not elaborate without cues to elicit. Patient's cognitive processing was slow overall which she herself attributes to fatigue from recent decrease in prednisone medication as well as her BP being low. Patient had not eaten her breakfast and when MD entered room, her poor PO intake was a part of the discussion and patient promising to MD that she would eat more. Patient reported that she has been "tired a lot" for past year and that "Donnald Garre been dealing with something, can't nobody findout what it is." When SLP asked her if she has had any events in her life such as loss of family/friends she did report that she has lost 7 friends to Covid. Patient will benefit from skilled SLP intervention maximize cognitive function prior to discharge.  Skilled Therapeutic Interventions          Cognistat, SLE  SLP Assessment  Patient will need skilled [place] [facility] during CIR admission    Recommendations  Patient destination: Home Follow up Recommendations: None Equipment Recommended: None recommended by SLP    SLP Frequency 3 to 5 out of 7 days   SLP Duration  SLP Intensity  SLP Treatment/Interventions 11/23   Minumum of 1-2 x/day, 30 to 90 minutes  Cognitive remediation/compensation;Internal/external aids;Medication managment;Environmental controls;Functional tasks;Patient/family education    Pain Pain Assessment Pain Scale: Faces Pain Score: 0-No pain Faces Pain Scale: Hurts little more Pain Type: Acute pain Pain Location: Generalized  Pain Descriptors / Indicators: Discomfort Pain Onset: On-going Pain Intervention(s): Repositioned;Emotional support  Prior Functioning Cognitive/Linguistic Baseline: Within  functional limits Type of Home: House  Lives With: Alone Available Help at Discharge: Family;Friend(s);Available PRN/intermittently Education: reports 2 years of college Vocation: Retired (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)  SLP Evaluation Cognition Overall Cognitive Status: Impaired/Different from baseline Arousal/Alertness: Awake/alert Orientation Level: Development worker, international aid (comment) (a little disoriented to time (thought it was later in morning)) Year: 2022 Month: November Day of Week: Correct Attention: Sustained;Selective Sustained Attention: Appears intact Selective Attention: Impaired Selective Attention Impairment: Verbal complex Memory: Impaired Memory Impairment: Storage deficit Awareness: Impaired Awareness Impairment: Anticipatory impairment Problem Solving: Impaired Problem Solving Impairment: Verbal complex Executive Function: Reasoning;Self Monitoring Reasoning: Impaired Reasoning Impairment: Verbal complex Self Monitoring: Impaired Self Monitoring Impairment: Verbal complex Safety/Judgment: Appears intact  Comprehension Auditory Comprehension Overall Auditory Comprehension: Appears within functional limits for tasks assessed Expression Expression Primary Mode of Expression: Verbal Verbal Expression Overall Verbal Expression: Appears within functional limits for tasks assessed Oral Motor Oral Motor/Sensory Function Overall Oral Motor/Sensory Function: Within functional limits Motor Speech Overall Motor Speech: Appears within functional limits for tasks assessed Respiration: Within functional limits Resonance: Within functional limits Articulation: Within functional limitis Intelligibility: Intelligible Motor Planning: Witnin functional limits  Care Tool Care Tool Cognition Ability to hear (with hearing aid or hearing appliances if normally used     Expression of Ideas and Wants     Understanding Verbal and  Non-Verbal Content    Memory/Recall Ability      Short Term Goals: Week 1: SLP Short Term Goal 1 (Week 1): Patient will perform simulated medication organization/pill box task with minA cues for accuracy. SLP Short Term Goal 2 (Week 1): Patient will perform alternating attention tasks in quite or mildly distracting environments with minA verbal redirection cues. SLP Short Term Goal 3 (Week 1): Patient will demonstrate adequate recall of daily events (therapeutic, nursing, etc) with minA cues for compensating (memory notebook, etc). SLP Short Term Goal 4 (Week 1): Patient will SLP Short Term Goal 5 (Week 1): Patient will demonstrate adequate awareness to errors and attempt to self-correct during functional tasks, with minA verbal, visual cues.  Refer to Care Plan for Long Term Goals  Recommendations for other services: None   Discharge Criteria: Patient will be discharged from SLP if patient refuses treatment 3 consecutive times without medical reason, if treatment goals not met, if there is a change in medical status, if patient makes no progress towards goals or if patient is discharged from hospital.  The above assessment, treatment plan, treatment alternatives and goals were discussed and mutually agreed upon: by patient  Sonia Baller, MA, CCC-SLP Speech Therapy

## 2021-04-16 NOTE — Progress Notes (Signed)
PROGRESS NOTE   Subjective/Complaints:  Pt reports dizzy again this AM.  Also feels weaker in general-  Esp when sits EOB.   Getting SLP consult/eval.   ROS:  Pt denies SOB, abd pain, CP, N/V/C/D, and vision changes   Objective:   DG CHEST PORT 1 VIEW  Result Date: 04/16/2021 CLINICAL DATA:  Decreased mentation, concern for infection EXAM: PORTABLE CHEST 1 VIEW COMPARISON:  03/26/2021 FINDINGS: Low lung volumes. Unchanged cardiac and mediastinal contours. No new focal pulmonary opacity. Bibasilar atelectasis. No definite pleural effusion. No acute osseous abnormality. IMPRESSION: No acute cardiopulmonary process. Electronically Signed   By: Wiliam Ke M.D.   On: 04/16/2021 11:25   Recent Labs    04/15/21 0513 04/16/21 0903  WBC 4.7 5.7  HGB 12.3 12.6  HCT 36.2 36.0  PLT 218 228    Recent Labs    04/15/21 0513 04/16/21 0903  NA 133* 133*  K 3.9 4.0  CL 108 104  CO2 17* 20*  GLUCOSE 85 99  BUN 14 14  CREATININE 1.01* 1.10*  CALCIUM 8.9 9.0     Intake/Output Summary (Last 24 hours) at 04/16/2021 2021 Last data filed at 04/16/2021 1400 Gross per 24 hour  Intake 240 ml  Output --  Net 240 ml        Physical Exam: Vital Signs Blood pressure 95/74, pulse 99, temperature 97.7 F (36.5 C), temperature source Oral, resp. rate 14, height 5\' 5"  (1.651 m), weight 72.5 kg, SpO2 99 %.      General: awake, alert, appropriate, SLP in room; doesn't remember anyone's role; NAD HENT: conjugate gaze; oropharynx moist CV: borderline tachycardic/regular rate; no JVD Pulmonary: CTA B/L; no W/R/R- good air movement GI: soft, NT, ND, (+)BS Psychiatric: appropriate Neurological: alert, but slightly confused Musculoskeletal:     Cervical back: Normal range of motion. No rigidity.     Comments: 4+/5 in arms proximal and distally B/L HF 4-/5; KE 4/5 DF/PF 4+/5 B/L  No swollen joints seen on exam  Skin:     Comments: Intact to light touch on exam B/L  Neurological:     Comments: Patient is alert.  Mood is a bit flat.  Light touch intact x all 4 extremities.    Assessment/Plan: 1. Functional deficits which require 3+ hours per day of interdisciplinary therapy in a comprehensive inpatient rehab setting. Physiatrist is providing close team supervision and 24 hour management of active medical problems listed below. Physiatrist and rehab team continue to assess barriers to discharge/monitor patient progress toward functional and medical goals  Care Tool:  Bathing    Body parts bathed by patient: Right arm, Left arm, Chest, Abdomen, Face, Right upper leg, Left upper leg, Front perineal area   Body parts bathed by helper: Right lower leg, Left lower leg, Buttocks     Bathing assist Assist Level: Moderate Assistance - Patient 50 - 74%     Upper Body Dressing/Undressing Upper body dressing   What is the patient wearing?: Pull over shirt    Upper body assist Assist Level: Minimal Assistance - Patient > 75%    Lower Body Dressing/Undressing Lower body dressing      What is  the patient wearing?: Pants, Underwear/pull up     Lower body assist Assist for lower body dressing: Moderate Assistance - Patient 50 - 74%     Toileting Toileting    Toileting assist Assist for toileting: Supervision/Verbal cueing     Transfers Chair/bed transfer  Transfers assist     Chair/bed transfer assist level: Minimal Assistance - Patient > 75%     Locomotion Ambulation   Ambulation assist      Assist level: Minimal Assistance - Patient > 75% Assistive device: Walker-rolling Max distance: 75'   Walk 10 feet activity   Assist     Assist level: Minimal Assistance - Patient > 75% Assistive device: Walker-rolling   Walk 50 feet activity   Assist Walk 50 feet with 2 turns activity did not occur: Safety/medical concerns  Assist level: Minimal Assistance - Patient > 75% Assistive  device: Walker-rolling    Walk 150 feet activity   Assist Walk 150 feet activity did not occur: Safety/medical concerns         Walk 10 feet on uneven surface  activity   Assist Walk 10 feet on uneven surfaces activity did not occur: Safety/medical concerns         Wheelchair     Assist Is the patient using a wheelchair?: Yes Type of Wheelchair: Manual    Wheelchair assist level: Total Assistance - Patient < 25% Max wheelchair distance: 150    Wheelchair 50 feet with 2 turns activity    Assist        Assist Level: Total Assistance - Patient < 25%   Wheelchair 150 feet activity     Assist      Assist Level: Total Assistance - Patient < 25%   Blood pressure 95/74, pulse 99, temperature 97.7 F (36.5 C), temperature source Oral, resp. rate 14, height 5\' 5"  (1.651 m), weight 72.5 kg, SpO2 99 %.    Medical Problem List and Plan: 1.  Debility secondary to generalized pain/rheumatoid arthritis.  Follow-up outpatient rheumatology services Dr .  Prednisone taper as indicated             -patient may  shower             -ELOS/Goals: 5-7 days mod I  Con't PT and OT and SLP added 2.  Antithrombotics: -DVT/anticoagulation:  Pharmaceutical: Lovenox             -antiplatelet therapy: n/a 3. Pain Management: Tramadol as needed  11/6- on stable prednisone dose- pain controlled- pt has moon facies as side effect, will reduce to 5mg  in am   11.7- no change in pain- con't regimen  11/9- pt thinks reduction has made her more tired- will monitor- no pain 4. Mood: Wellbutrin 300 mg daily, Lexapro 20 mg daily             -antipsychotic agents: N/A 5. Neuropsych: This patient is? capable of making decisions on her own behalf. 6. Skin/Wound Care: Routine skin checks 7. Fluids/Electrolytes/Nutrition: Routine in and outs with follow-up chemistries 8.  Hypertension.  Norvasc 5 mg daily, lisinopril 30 mg daily.  Monitor with increased mobility   Vitals:    04/16/21 1339 04/16/21 1930  BP: 106/70 95/74  Pulse: (!) 108 99  Resp: 18 14  Temp: 97.8 F (36.6 C) 97.7 F (36.5 C)  SpO2: 98% 99%    11/7- will stop Norvasc 5 mg daily as of Tuesday and recheck labs in Am to see if needs IVFs  11/8- will decrease  Lisinopril from 20 mg daily to 5 mg daily and start ACE wraps and TEDs to knee B/L- also, push fluids- explained to pt needs 6-8 cups/water/day.   11/9- BP still low- but better in documentation, will hold Lisinopril completely tomorrow-  9.  Elevated LFTs/transaminitis- as well as CK.  Improved.  Follow-up chemistries  11/2- coming down- is 60/50 now- 10/24 was 150/135- so improving- con't to monitor  11/8- down to 50/41- con't monitoring 10.  Hyperlipidemia.  Lipitor held due to elevated LFTs 11. Poor energy  11/3- will check TSH and Vit D levels in AM- could be causing 11/6 vit D level mildly reduced will supplement 1000IU daily  12. Hx of depression  11/3- on wellbutrin and Lexapro- high doses- will con't 13. Hypotension  11/8- dramatically decrease lisinopril and add ACE wraps/TEDs and have pt drink more- per CMP, not dry, so wait on IVFs.  14. Moderate to severe malnutrition  11/9- have ordered dietician consult Since pt's albumin is dorpping.     LOS: 8 days A FACE TO FACE EVALUATION WAS PERFORMED  Desi Carby 04/16/2021, 8:21 PM

## 2021-04-16 NOTE — Progress Notes (Signed)
Occupational Therapy Session Note  Patient Details  Name: Carrie [name] MRN: [id number] Date of Birth: 15-Sep-1960  Today's Date: 04/16/2021 OT Individual Time: 1347-1416 OT Individual Time Calculation (min): 29 min    Short Term Goals: Week 1:  OT Short Term Goal 1 (Week 1): Pt will indepedently verbalize at least 2 energy conservation strategies to implement into ADL. OT Short Term Goal 2 (Week 1): Pt will don pants with CGA and AE PRN. OT Short Term Goal 3 (Week 1): Pt will complete shower transfer with CGA and LRAD.  Skilled Therapeutic Interventions/Progress Updates:    Pt in recliner to start initially not agreeable to therapy stating she didn't feel well.  Therapist finally convinced her to participate in some UE strengthening exercises as well as transfer to the wheelchair.  Min assist for completion of transfer with use of the RW from the recliner to the other side of the bed to the wheelchair.  She was able to complete 2 sets of 10 reps for shoulder flexion and elbow flexion with use of a 1 lb dowel rod.  She needed rest breaks between each set with HR increasing up to 117 and O2 sats at 98% on room air.  Finished session with pt using the dowel rod with BUEs to hit a beach ball back and forth to therapist for 3 sets of 10.  Increased difficulty noted trying to punch the beach ball forward with enough velocity to get it back to the therapist.  Pt was left with therapy tech to transport to next PT session.    Therapy Documentation Precautions:  Precautions Precautions: Fall Precaution Comments: fatigues quickly Restrictions Weight Bearing Restrictions: No  Pain: Pain Assessment Pain Scale: Faces Pain Score: 0-No pain Faces Pain Scale: Hurts little more Pain Type: Acute pain Pain Location: Generalized Pain Descriptors / Indicators: Discomfort Pain Onset: On-going Pain Intervention(s): Repositioned;Emotional support ADL: See Care Tool Section for some details of mobility and  selfcare  Therapy/Group: Individual Therapy  Bassel Gaskill OTR/L 04/16/2021, 4:11 PM

## 2021-04-17 ENCOUNTER — Inpatient Hospital Stay (HOSPITAL_COMMUNITY): Payer: Federal, State, Local not specified - PPO

## 2021-04-17 DIAGNOSIS — F322 Major depressive disorder, single episode, severe without psychotic features: Secondary | ICD-10-CM | POA: Diagnosis not present

## 2021-04-17 DIAGNOSIS — R410 Disorientation, unspecified: Secondary | ICD-10-CM | POA: Diagnosis not present

## 2021-04-17 DIAGNOSIS — R5381 Other malaise: Secondary | ICD-10-CM | POA: Diagnosis not present

## 2021-04-17 LAB — URINE CULTURE

## 2021-04-17 IMAGING — MR MR HEAD W/O CM
9 of 10 series · 39 of 48 positions shown · non-contrast
Comparison: Head CT [DATE].

CLINICAL DATA: Delirium; pt admitted after fall- on rehab- getting
more and more confused- difficulty now with motor planning to
understand to push w/c.

EXAM:
MRI HEAD WITHOUT CONTRAST
TECHNIQUE: Multiplanar, multiecho pulse sequences of the brain and surrounding
structures were obtained without intravenous contrast.

[Series 3: DWI · axial · 3.0mm · 1.09mm/px · z∈[-33,+123]mm · 11 of 106 slices shown (1 of 4)]
[im 1/106]
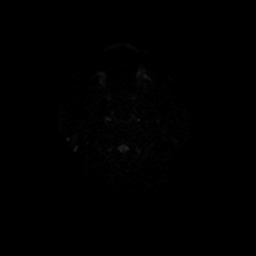
[im 11/106]
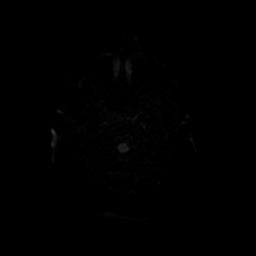
[im 22/106]
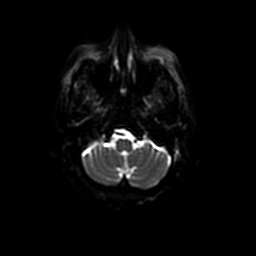
[im 32/106]
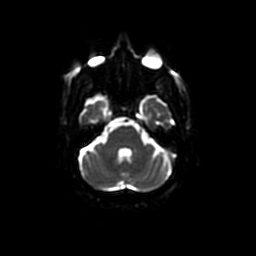
[im 43/106]
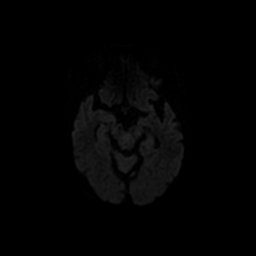
[im 53/106]
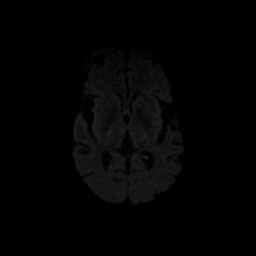
[im 64/106]
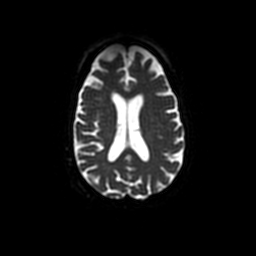
[im 74/106]
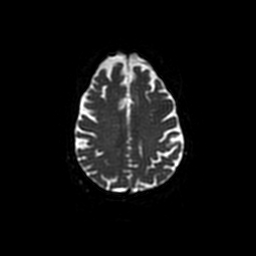
[im 85/106]
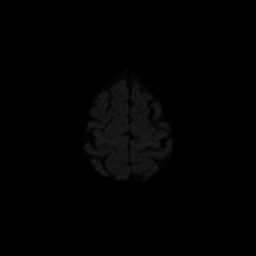
[im 95/106]
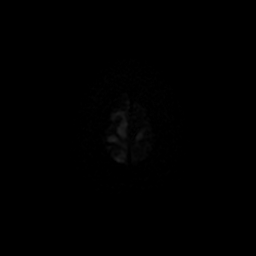
[im 106/106]
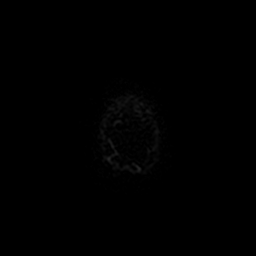

[Series 4: DWI · coronal · 5.0mm · 1.09mm/px · 8 of 74 slices shown (2 of 4)]
[im 1/74]
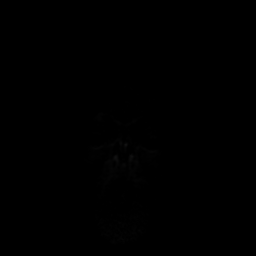
[im 11/74]
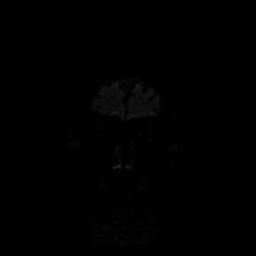
[im 21/74]
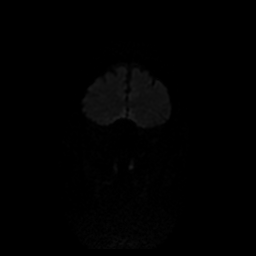
[im 32/74]
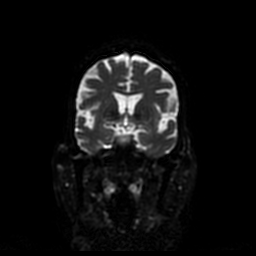
[im 42/74]
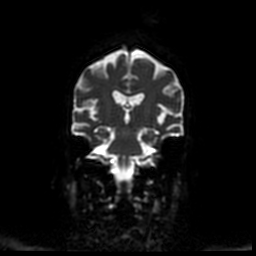
[im 53/74]
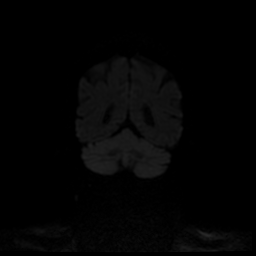
[im 63/74]
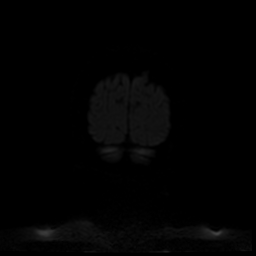
[im 74/74]
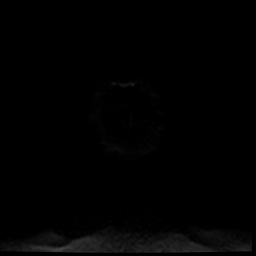

[Series 5: T1 · sagittal · 5.0mm · 0.47mm/px · 2 of 23 slices shown (1 of 2)]
[im 1/23]
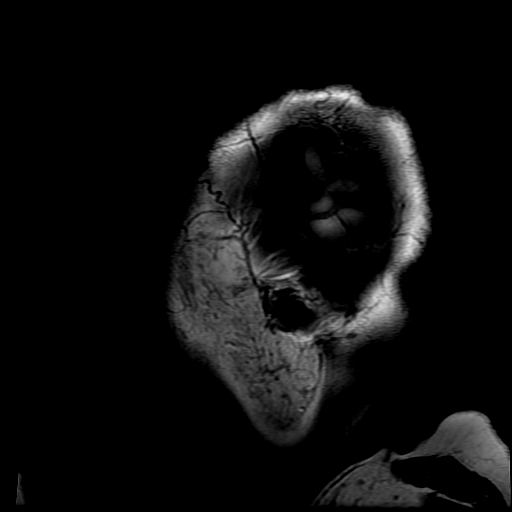
[im 23/23]
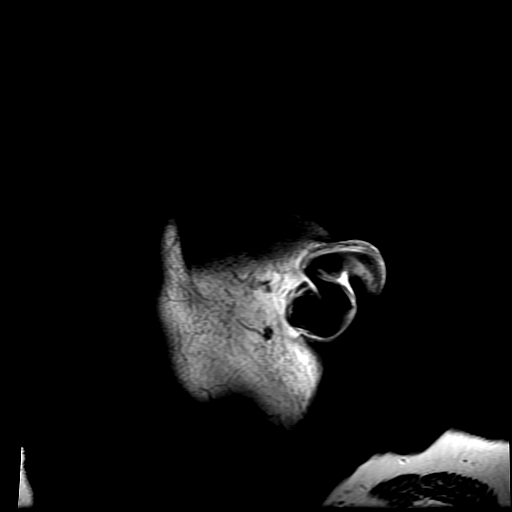

[Series 6: T2 · axial · 5.0mm · 0.43mm/px · z∈[-28,+110]mm · 2 of 24 slices shown (1 of 2)]
[im 1/24]
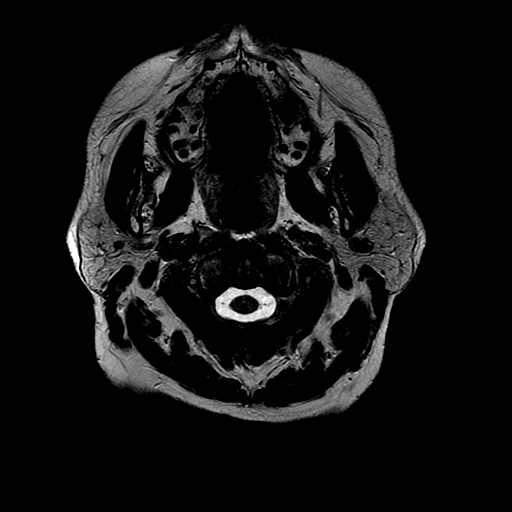
[im 24/24]
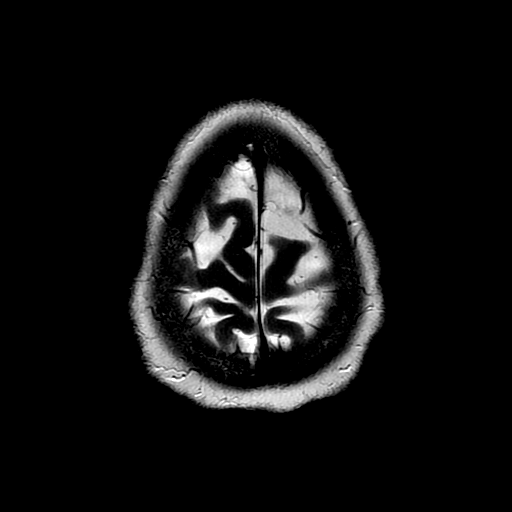

[Series 7: FLAIR · axial · 3.0mm · 0.43mm/px · z∈[-28,+110]mm · 2 of 24 slices shown]
[im 1/24]
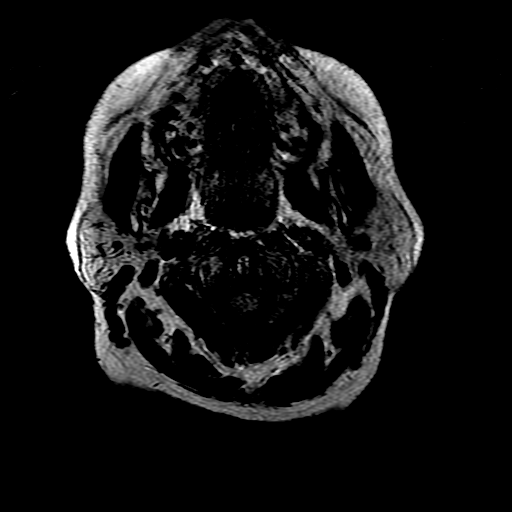
[im 24/24]
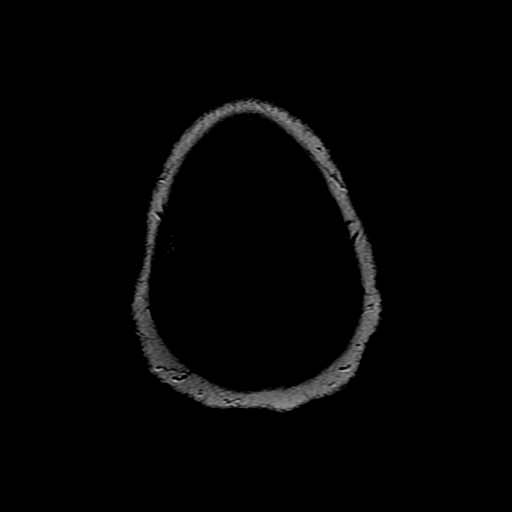

[Series 9: T1 · axial · 3.0mm · 0.43mm/px · z∈[-32,+17]mm · 3 of 100 slices shown (2 of 2)]
[im 1/100]
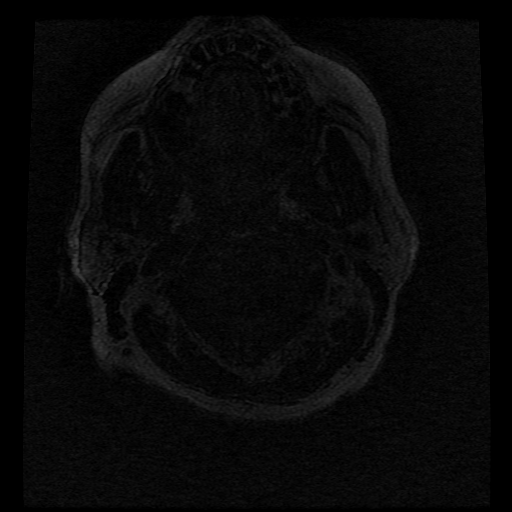
[im 12/100]
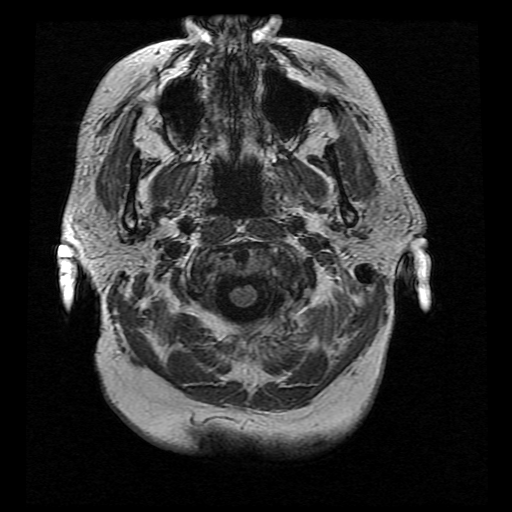
[im 34/100]
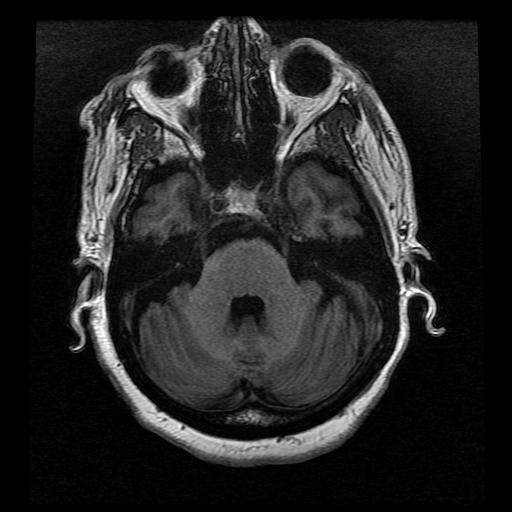

[Series 10: T2 · coronal · 5.0mm · 0.43mm/px · 2 of 24 slices shown (2 of 2)]
[im 1/24]
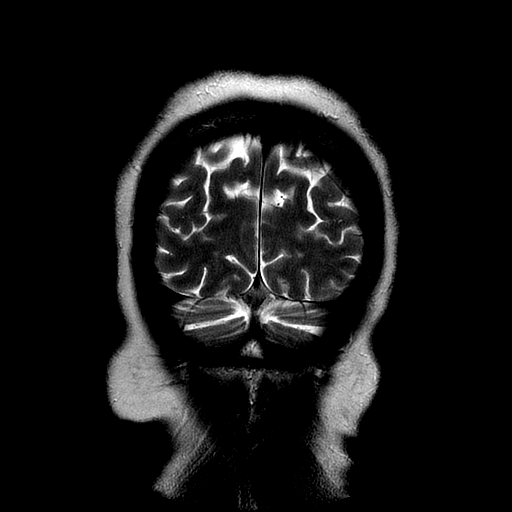
[im 24/24]
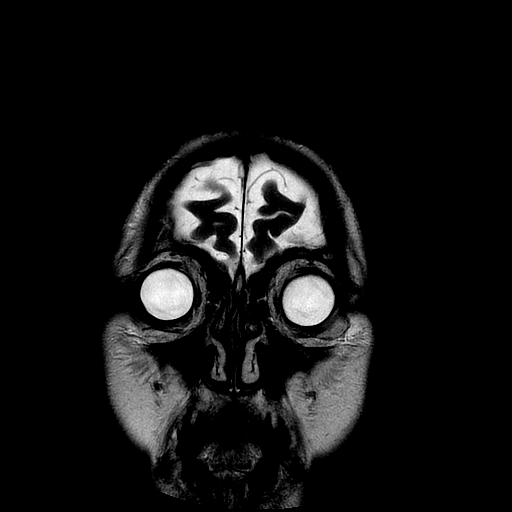

[Series 300: DWI · axial · 3.0mm · 1.09mm/px · z∈[-33,+123]mm · 5 of 53 slices shown (3 of 4)]
[im 1/53]
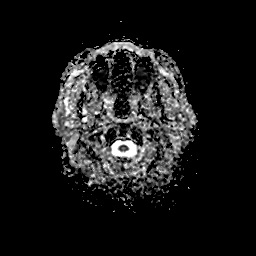
[im 14/53]
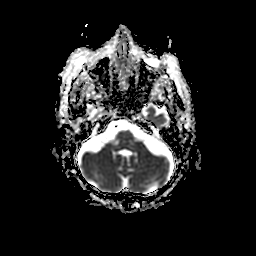
[im 27/53]
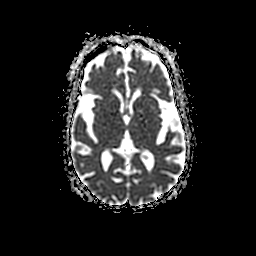
[im 40/53]
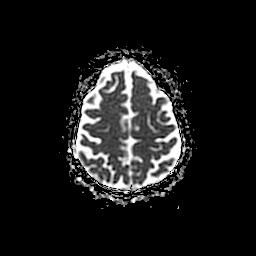
[im 53/53]
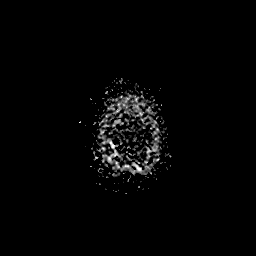

[Series 400: DWI · coronal · 5.0mm · 1.09mm/px · 4 of 37 slices shown (4 of 4)]
[im 1/37]
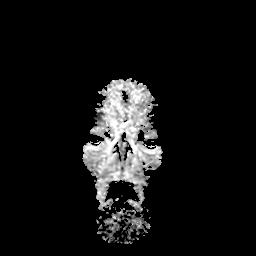
[im 13/37]
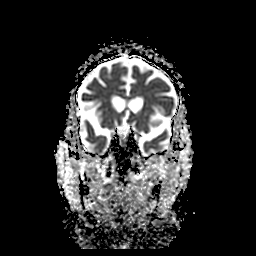
[im 25/37]
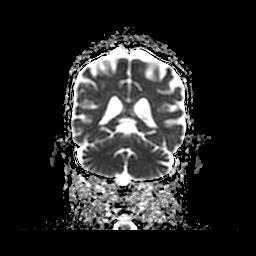
[im 37/37]
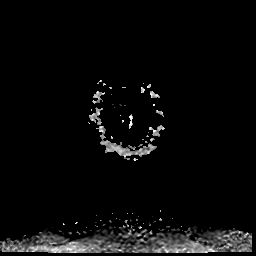

[39 of 48 positions shown; findings below may reference images not displayed]

FINDINGS: Brain: No acute infarction, hemorrhage, hydrocephalus, extra-axial
collection or mass lesion. The brain parenchyma has normal signal
characteristics. Mild parenchymal volume loss.

Vascular: Normal flow voids.

Skull and upper cervical spine: Normal marrow signal.

Sinuses/Orbits: Negative.

Other: Bilateral mastoid effusion, left greater than right.
IMPRESSION: 1. No acute intracranial abnormality.
2. Mild parenchymal volume loss.
3. Mild bilateral mastoid effusion.

## 2021-04-17 MED ORDER — ARIPIPRAZOLE 5 MG PO TABS
5.0000 mg | ORAL_TABLET | Freq: Every day | ORAL | Status: DC
  Administered 2021-04-17 – 2021-04-29 (×13): 5 mg via ORAL
  Filled 2021-04-17 (×13): qty 1

## 2021-04-17 NOTE — Progress Notes (Signed)
Occupational Therapy Session Note  Patient Details  Name: Carrie [name] MRN: [id number] Date of Birth: 07-Sep-1960  Today's Date: 04/18/2021 OT Individual Time: 1300-1350 OT Individual Time Calculation (min): 50 min  and Today's Date: 04/18/2021 OT Group Time:  - 60 minutes missed    Skilled Therapeutic Interventions/Progress Updates:    Pt greeted in bed, initially declining tx due to feeling unwell, c/o stomachache after drinking 2 Ensures. She reported that she had a BM this morning. Pt agreeable to therapy as long as she didn't have to leave the bed. Mod A for supine<sit and set her up with a coloring activity. Pt asked OT how she was supposed to uncap her marker. Needed assistance for this due to cognition/RA. After a few minutes of coloring, pt reported needing to use the restroom (which she denied at start of session). Mod A for stand pivot<w/c without AD with pt presenting with posterior bias. The same assistance required for stand pivot<toilet with pt attempting to sit before fully reaching the transfer surface. Pt with +bladder void. Needed cues to complete hygiene and also throw her sanitary paper into the toilet. Pt doffed her underwear with Max A and cues before returning to the w/c. Pt performed handwashing with Mod A while sitting at the sink, assistance and cues to locate soap. Set her up for stand pivot<bed with pt initiating squat pivot and losing her balance backwards after reaching EOB. Worked on dressing skills sit<stand from EOB using RW. Min A to rise into standing with bed elevated. Pt asked OT where her underwear was after it was already threaded through LEs and about to be elevated over her hips. Max-Total A overall with pt unable to motor plan how to don her shirt. She was oriented to year, place, and current season. Pt asking to return to bed due to severe fatigue. Max A to do this. Pt reported not having enough energy to reposition herself during bed mobility, required Max A  overall. 10 minutes missed due to fatigue. Notified RN of her c/o nausea and just not feeling well.   Pt denied dizziness, had Teds donned with OOB today during session  Skilled Therapeutic Intervention (Group Therapy) Pt declined participation in group due to profound fatigue. 60 minutes missed.  Therapy Documentation Precautions:  Precautions Precautions: Fall Precaution Comments: fatigues quickly Restrictions Weight Bearing Restrictions: No General: General OT Amount of Missed Time: 60 Minutes Vital Signs: Therapy Vitals Temp: 98 F (36.7 C) Temp Source: Oral Pulse Rate: (!) 106 Resp: 18 BP: 121/83 Patient Position (if appropriate): Lying Oxygen Therapy SpO2: 97 % O2 Device: Room Air Pain: no c/o pain Pain Assessment Pain Scale: 0-10 Pain Score: 0-No pain ADL: ADL Eating: Set up Where Assessed-Eating: Wheelchair Grooming: Supervision/safety Where Assessed-Grooming: Sitting at sink Upper Body Bathing: Supervision/safety Where Assessed-Upper Body Bathing: Sitting at sink Lower Body Bathing: Minimal assistance Where Assessed-Lower Body Bathing: Standing at sink Upper Body Dressing: Supervision/safety Where Assessed-Upper Body Dressing: Sitting at sink Lower Body Dressing: Minimal assistance Where Assessed-Lower Body Dressing: Edge of bed Toileting: Minimal assistance Where Assessed-Toileting: Teacher, adult education: Curator Method: Proofreader: Grab bars, Raised toilet seat Tub/Shower Transfer: Not assessed Film/video editor: Not assessed   Therapy/Group: Individual Therapy and Group Therapy  Carrie [name] 04/18/2021, 4:23 PM

## 2021-04-17 NOTE — Progress Notes (Signed)
Physical Therapy Weekly Progress Note  Patient Details  Name: Carrie [name] MRN: [id number] Date of Birth: 02-07-1961  Beginning of progress report period: April 09, 2021 End of progress report period: April 17, 2021  Today's Date: 04/17/2021 PT Individual Time: 0800-0900 PT Individual Time Calculation (min): 60 min   Patient has met 1 of 3 short term goals.  Pt did not meet CGA goals, as she requires min A for mobility at this time.  Patient continues to demonstrate the following deficits muscle weakness, decreased cardiorespiratoy endurance, and decreased standing balance and decreased balance strategies and therefore will continue to benefit from skilled PT intervention to increase functional independence with mobility.  Patient progressing toward long term goals..  Continue plan of care.  PT Short Term Goals Week 1:  PT Short Term Goal 1 (Week 1): Patient will complete bed mobility with CGA consistently PT Short Term Goal 2 (Week 1): Patient will transfer bed <> wc with LRAD and CGA consistently PT Short Term Goal 2 - Progress (Week 1): Not met PT Short Term Goal 3 (Week 1): Patient will ambulate >76f with LRAD and CGA PT Short Term Goal 3 - Progress (Week 1): Not met Week 2:  PT Short Term Goal 1 (Week 2): Pt will ambulate >25 ft conistently PT Short Term Goal 2 (Week 2): Pt will propell w/c >50 ft PT Short Term Goal 3 (Week 2): Pt will initiate stair training  Skilled Therapeutic Interventions/Progress Updates:    pt received in bed and agreeable to therapy. No complaint of pain. C/o fatigue. Donned ted hose and ace wraps total assist. Supine<>sit with supervision and encouragement. Sit to stand with min-mod A throughout session, Stand pivot transfer with RW and mod A for balance. Pt then propelled w/c 2 x 50 ft with 2 turns. Pt instructed on technique but demoed poor carryover, requiring continued multimodal cueing. Pt would benefit from theraband on rims to improve grip  when it is available. Nustep x 10 min at level 1. Therapist reinforced energy conservation skills learned in group, encouraging pt to self initiate rest breaks whenever needed to make it to 10 minutes. During task, pt repeatedly asked how long to continue and what she was supposed to do, demoing poor working mMarine scientist Pt returned to room and performed ambulatory transfer to recliner with min A and RW, was left with all needs in reach and alarm active.   Therapy Documentation Precautions:  Precautions Precautions: Fall Precaution Comments: fatigues quickly Restrictions Weight Bearing Restrictions: No   Therapy/Group: Individual Therapy  Carrie STAUCH11/03/2021, 8:44 AM

## 2021-04-17 NOTE — Progress Notes (Signed)
Recreational Therapy Session Note  Patient Details  Name: Carrie [name] MRN: [id number] Date of Birth: [birth year] Today's Date: 04/17/2021  Pain: no c/o Skilled Therapeutic Interventions/Progress Updates: Pt participated in stress managment/coping group today.  Pt education/discussion focused on stress exploration including factors that contribute to stress, factors that protect against stress and potential coping strategies.  Coping strategies included deep breathing, progressive muscle relaxation, imagery & challenging irrational thoughts.  Handouts provided.  Therapy/Group: Group Therapy   Charday Capetillo 04/17/2021, 9:56 AM

## 2021-04-17 NOTE — Progress Notes (Signed)
Occupational Therapy Weekly Progress Note  Patient Details  Name: Carrie [name] MRN: [id number] Date of Birth: July 23, 1960  Beginning of progress report period: April 09, 2021 End of progress report period: April 17, 2021  Today's Date: 04/17/2021 OT Missed Time: 30 Minutes Missed Time Reason: Patient fatigue   Patient has met 0 of 3 short term goals.  Pt is making minimal progress toward OT goals 2/2 intense and limiting fatigue and recent low blood pressure. Compensating for low BP with TEDS and ACE wraps when OOB at this time. Note pt fatigues very quickly and attempted shower but unable to finish. Pt completing LB ADL with Min/Mod A and ambulatory transfers with Min A with RW. Continue to implement energy conservation techniques and maximize participation with ADLs and increase OOB tolerance.   Patient continues to demonstrate the following deficits: muscle weakness, decreased cardiorespiratoy endurance, impaired timing and sequencing, decreased coordination, and decreased motor planning, decreased motor planning, decreased initiation, decreased attention, decreased awareness, decreased problem solving, decreased safety awareness, and delayed processing, and decreased sitting balance, decreased standing balance, decreased postural control, and decreased balance strategies and therefore will continue to benefit from skilled OT intervention to enhance overall performance with BADL, iADL, and Reduce care partner burden.  Patient progressing toward long term goals..  Continue plan of care.  OT Short Term Goals Week 1:  OT Short Term Goal 1 (Week 1): Pt will indepedently verbalize at least 2 energy conservation strategies to implement into ADL. OT Short Term Goal 1 - Progress (Week 1): Progressing toward goal OT Short Term Goal 2 (Week 1): Pt will don pants with CGA and AE PRN. OT Short Term Goal 2 - Progress (Week 1): Progressing toward goal OT Short Term Goal 3 (Week 1): Pt will complete  shower transfer with CGA and LRAD. OT Short Term Goal 3 - Progress (Week 1): Progressing toward goal Week 2:  OT Short Term Goal 1 (Week 2): Pt will perform shower level bathing with no more than Min A and AE PRN OT Short Term Goal 2 (Week 2): Pt will ambulate to/from bathroom with LRAD Supervision OT Short Term Goal 3 (Week 2): Pt will utilize energy conservation techniques during ADL with no more than Min verbal cues  Skilled Therapeutic Interventions/Progress Updates:    Pt greeted at time of scheduled OT session in bed supine resting, stating "I cant do a thing I am exhausted from that MRI." Despite encouragement, pt continued to state unable to participate this session. Missed 30 mins of OT.   Therapy Documentation Precautions:  Precautions Precautions: Fall Precaution Comments: fatigues quickly Restrictions Weight Bearing Restrictions: No    Therapy/Group: Individual Therapy  Viona Gilmore 04/17/2021, 12:30 PM

## 2021-04-17 NOTE — Progress Notes (Signed)
PROGRESS NOTE   Subjective/Complaints:  No dizziness today; but still so fatigued.  Sister thinks severe depression is the issue. Going downhill for 1 year- ever since stopped working 1 year ago- doesn't bathe; doesn't "fix herself up".   Per PT_ cannot push w/c- motor planning an issue-  Tremor for months- and pills everywhere- and can stop them sometimes, but not always.    Refused to speak with neuropsychologist.     ROS:  Pt denies SOB, abd pain, CP, N/V/C/D, and vision changes   Objective:   DG CHEST PORT 1 VIEW  Result Date: 04/16/2021 CLINICAL DATA:  Decreased mentation, concern for infection EXAM: PORTABLE CHEST 1 VIEW COMPARISON:  03/26/2021 FINDINGS: Low lung volumes. Unchanged cardiac and mediastinal contours. No new focal pulmonary opacity. Bibasilar atelectasis. No definite pleural effusion. No acute osseous abnormality. IMPRESSION: No acute cardiopulmonary process. Electronically Signed   By: Wiliam Ke M.D.   On: 04/16/2021 11:25   Recent Labs    04/15/21 0513 04/16/21 0903  WBC 4.7 5.7  HGB 12.3 12.6  HCT 36.2 36.0  PLT 218 228    Recent Labs    04/15/21 0513 04/16/21 0903  NA 133* 133*  K 3.9 4.0  CL 108 104  CO2 17* 20*  GLUCOSE 85 99  BUN 14 14  CREATININE 1.01* 1.10*  CALCIUM 8.9 9.0     Intake/Output Summary (Last 24 hours) at 04/17/2021 1052 Last data filed at 04/17/2021 0700 Gross per 24 hour  Intake 360 ml  Output 1 ml  Net 359 ml        Physical Exam: Vital Signs Blood pressure 95/74, pulse 99, temperature 97.7 F (36.5 C), temperature source Oral, resp. rate 14, height 5\' 5"  (1.651 m), weight 72.5 kg, SpO2 99 %.       General: awake, alert, appropriate,  anhedonic; sitting on numotion; NAD HENT: conjugate gaze; oropharynx moist CV: regular rate; no JVD Pulmonary: CTA B/L; no W/R/R- good air movement GI: soft, NT, ND, (+)BS Psychiatric: appropriate- but  severely depressed/anhedonic;  Neurological: confused-.poor STM  Musculoskeletal:     Cervical back: Normal range of motion. No rigidity.     Comments: 4+/5 in arms proximal and distally B/L HF 4-/5; KE 4/5 DF/PF 4+/5 B/L  No swollen joints seen on exam  Skin:    Comments: Intact to light touch on exam B/L  Neurological:     Comments: Patient is alert.  Mood is a bit flat.  Light touch intact x all 4 extremities.    Assessment/Plan: 1. Functional deficits which require 3+ hours per day of interdisciplinary therapy in a comprehensive inpatient rehab setting. Physiatrist is providing close team supervision and 24 hour management of active medical problems listed below. Physiatrist and rehab team continue to assess barriers to discharge/monitor patient progress toward functional and medical goals  Care Tool:  Bathing    Body parts bathed by patient: Right arm, Left arm, Chest, Abdomen, Face, Right upper leg, Left upper leg, Front perineal area   Body parts bathed by helper: Right lower leg, Left lower leg, Buttocks     Bathing assist Assist Level: Moderate Assistance - Patient 50 - 74%  Upper Body Dressing/Undressing Upper body dressing   What is the patient wearing?: Pull over shirt    Upper body assist Assist Level: Minimal Assistance - Patient > 75%    Lower Body Dressing/Undressing Lower body dressing      What is the patient wearing?: Pants, Underwear/pull up     Lower body assist Assist for lower body dressing: Moderate Assistance - Patient 50 - 74%     Toileting Toileting    Toileting assist Assist for toileting: Supervision/Verbal cueing     Transfers Chair/bed transfer  Transfers assist     Chair/bed transfer assist level: Minimal Assistance - Patient > 75%     Locomotion Ambulation   Ambulation assist      Assist level: Minimal Assistance - Patient > 75% Assistive device: Walker-rolling Max distance: 75'   Walk 10 feet  activity   Assist     Assist level: Minimal Assistance - Patient > 75% Assistive device: Walker-rolling   Walk 50 feet activity   Assist Walk 50 feet with 2 turns activity did not occur: Safety/medical concerns  Assist level: Minimal Assistance - Patient > 75% Assistive device: Walker-rolling    Walk 150 feet activity   Assist Walk 150 feet activity did not occur: Safety/medical concerns         Walk 10 feet on uneven surface  activity   Assist Walk 10 feet on uneven surfaces activity did not occur: Safety/medical concerns         Wheelchair     Assist Is the patient using a wheelchair?: Yes Type of Wheelchair: Manual    Wheelchair assist level: Total Assistance - Patient < 25% Max wheelchair distance: 150    Wheelchair 50 feet with 2 turns activity    Assist        Assist Level: Total Assistance - Patient < 25%   Wheelchair 150 feet activity     Assist      Assist Level: Total Assistance - Patient < 25%   Blood pressure 95/74, pulse 99, temperature 97.7 F (36.5 C), temperature source Oral, resp. rate 14, height 5\' 5"  (1.651 m), weight 72.5 kg, SpO2 99 %.    Medical Problem List and Plan: 1.  Debility secondary to generalized pain/rheumatoid arthritis.  Follow-up outpatient rheumatology services Dr .  Prednisone taper as indicated             -patient may  shower             -ELOS/Goals: 5-7 days mod I  con't PT, OT and SLP 2.  Antithrombotics: -DVT/anticoagulation:  Pharmaceutical: Lovenox             -antiplatelet therapy: n/a 3. Pain Management: Tramadol as needed  11/6- on stable prednisone dose- pain controlled- pt has moon facies as side effect, will reduce to 5mg  in am   11.7- no change in pain- con't regimen  11/9- pt thinks reduction has made her more tired- will monitor- no pain 4. Mood: Wellbutrin 300 mg daily, Lexapro 20 mg daily             -antipsychotic agents: N/A 5. Neuropsych: This patient is? capable of  making decisions on her own behalf. 6. Skin/Wound Care: Routine skin checks 7. Fluids/Electrolytes/Nutrition: Routine in and outs with follow-up chemistries 8.  Hypertension.  Norvasc 5 mg daily, lisinopril 30 mg daily.  Monitor with increased mobility   Vitals:   04/16/21 1339 04/16/21 1930  BP: 106/70 95/74  Pulse: (!) 108 99  Resp: 18 14  Temp: 97.8 F (36.6 C) 97.7 F (36.5 C)  SpO2: 98% 99%    11/7- will stop Norvasc 5 mg daily as of Tuesday and recheck labs in Am to see if needs IVFs  11/8- will decrease Lisinopril from 20 mg daily to 5 mg daily and start ACE wraps and TEDs to knee B/L- also, push fluids- explained to pt needs 6-8 cups/water/day.   11/9- BP still low- but better in documentation, will hold Lisinopril completely tomorrow-   11/10- BP better today- off all BP meds 9.  Elevated LFTs/transaminitis- as well as CK.  Improved.  Follow-up chemistries  11/2- coming down- is 60/50 now- 10/24 was 150/135- so improving- con't to monitor  11/8- down to 50/41- con't monitoring 10.  Hyperlipidemia.  Lipitor held due to elevated LFTs 11. Poor energy  11/3- will check TSH and Vit D levels in AM- could be causing 11/6 vit D level mildly reduced will supplement 1000IU daily  12. Hx of depression  11/3- on wellbutrin and Lexapro- high doses- will con't  11/10- sister thinks she's bipolar- Will start Abilify 5 mg QHS as adjuvant- think could help pull her out of depressed mood and poor initiation and now cognitive issues?.  13. Hypotension  11/8- dramatically decrease lisinopril and add ACE wraps/TEDs and have pt drink more- per CMP, not dry, so wait on IVFs.  14. Moderate to severe malnutrition  11/9- have ordered dietician consult Since pt's albumin is dropping.    I spent a total of 44 minutes on total care- >50% coordination calling sister and d/w PT about her care- sounds like sister thinks depression is the main issue.    LOS: 9 days A FACE TO FACE EVALUATION WAS  PERFORMED  Khalel Alms 04/17/2021, 10:52 AM

## 2021-04-17 NOTE — Progress Notes (Signed)
Speech Language Pathology Daily Session Note  Patient Details  Name: Carrie [name] MRN: [id number] Date of Birth: Aug 22, 1960  Today's Date: 04/17/2021 SLP Individual Time: 1015-1100 SLP Individual Time Calculation (min): 45 min  Short Term Goals: Week 1: SLP Short Term Goal 1 (Week 1): Patient will perform simulated medication organization/pill box task with minA cues for accuracy. SLP Short Term Goal 2 (Week 1): Patient will perform alternating attention tasks in quite or mildly distracting environments with minA verbal redirection cues. SLP Short Term Goal 3 (Week 1): Patient will demonstrate adequate recall of daily events (therapeutic, nursing, etc) with minA cues for compensating (memory notebook, etc). SLP Short Term Goal 4 (Week 1): Patient will SLP Short Term Goal 5 (Week 1): Patient will demonstrate adequate awareness to errors and attempt to self-correct during functional tasks, with minA verbal, visual cues.  Skilled Therapeutic Interventions: Pt received supine in bed and agreeable to skilled ST intervention with focus on cognitive goals. Pt appeared fatigued today, with flat affect, and displayed signs of decreased awareness. For example, pt consistently responded incorrectly to orientation questions though able to identify and self correct without cues and was eventually oriented to all concepts (i.e., month: "January I mean November", year: "1992, I mean 2022", place: "[facility] [employer], no [place]"). There was another occurrence where SLP turned off pt's TV and patient later stated "let me turn down the volume" and turned on TV with volume with no apparent awareness. Pt participated in discussion on medication management. She stated she was unable to recall prescribed medications prior to admission. SLP wrote down current medications on medication chart. Pt identified name/dose/frequency/purpose of medications via chart with sup A verbal cues and recalled information with mod A verbal  cues. Continue intervention on medication management and safety. Patient was transported to ortho gym and passed off to therapy tech for group therapy session. Continue per current plan of care.      Pain Pain Assessment Pain Scale: 0-10 Pain Score: 0-No pain  Therapy/Group: Individual Therapy  Tamala Ser 04/17/2021, 10:38 AM

## 2021-04-17 NOTE — Progress Notes (Signed)
Occupational Therapy Session Note  Patient Details  Name: Carrie [name] MRN: [id number] Date of Birth: 1961/05/25  Today's Date: 04/17/2021 OT Group Time: 1100-1200 OT Group Time Calculation (min): 60 min   Short Term Goals: Week 2:  OT Short Term Goal 1 (Week 2): Pt will perform shower level bathing with no more than Min A and AE PRN OT Short Term Goal 2 (Week 2): Pt will ambulate to/from bathroom with LRAD Supervision OT Short Term Goal 3 (Week 2): Pt will utilize energy conservation techniques during ADL with no more than Min verbal cues  Skilled Therapeutic Interventions/Progress Updates:  Pt participated in group session with a focus on stress mgmt, education on healthy coping strategies, and social interaction. Focus of session on providing coping strategies to manage new current level of function as a result of new diagnosis.  Session focus on breaking down stressors into "daily hassles," "major life stressors" and "life circumstances" in an effort to allow pts to chunk their stressors into groups. Pt actively sharing stressors and contributing to group conversation. Provided active listening, emotional support and therapeutic use of self. Offered education on factors that protect [place] against stress such as "daily uplifts," "healthy coping strategies" and "protective factors." Encouraged all group members to make an effort to actively recall one event from their day that was a daily uplift in an effort to protect their mindset from stressors. Issued pt handouts on healthy coping strategies to implement into routine.  Pt reports need to void bladder during session, total A transport into BR for toileting with pt completing stand pivot transfer with MODA ( most assist needed to stand from w/c) with MOD cues for hand placement and overall technique as pt using grab bar to pivot. Pt needed MOD A for 3/3 toileting tasks with pt able to complete anterior pericare with set- up assist via lateral  leans and MODA needed for clothing mgmt. Pt attempting to stand from [facility] over toilet with her feet off the floor, MOD verbal/ tactile cues needed to position feet correctly. MOD A to rise from toilet and pivot back to w/c. Pt transported back to room with total A. Pt did c/o lightheadesness once back in room BP 98/69 from w/c with TEDs and ace wraps donned. Pt left in w/c with alarm belt activated and all needs wtihin reach.   Therapy Documentation Precautions:  Precautions Precautions: Fall Precaution Comments: fatigues quickly Restrictions Weight Bearing Restrictions: No  Pain: Pt reports no pain during session    Therapy/Group: Group Therapy  [name] 04/17/2021, 3:33 PM

## 2021-04-18 DIAGNOSIS — R5381 Other malaise: Secondary | ICD-10-CM | POA: Diagnosis not present

## 2021-04-18 NOTE — Progress Notes (Signed)
Occupational Therapy Session Note  Patient Details  Name: Carrie [name] MRN: [id number] Date of Birth: 1960-10-30  Today's Date: 04/19/2021 OT Individual Time: 2993-7169 OT Individual Time Calculation (min): 59 min    Skilled Therapeutic Interventions/Progress Updates:    Pt greeted in bed with no c/o pain. Agreeable to session, starting with using the restroom. Mod A for supine<sit with vcs for motor planning. Min A for stand pivot<w/c using the RW and Mod A for stand pivot<toilet using grab bar. Pt able to lower clothing given Mod balance assistance due to posterior lean. Note that after bladder void, pt completed hygiene in standing, had 1 posterior LOB without UE support on anything supportive, able to correct balance loss given assistance from therapist using toilet to prevent fall. Pt then completed UB bathing/pericare + UB/LB dressing sit<stand at the sink. Pt required cues for thoroughness during bathing, Max A to doff overhead shirt with cues, Min A with step by step cues to don new shirt. After doffing LB clothing with Max-Total A, pt able to pull underwear/pants up from ankles and halfway up bottom once standing. Note that pt had very limited standing endurance and needed to stand 4-5 times to complete task demands of hygiene/dressing. Min-Mod A for sit<stands at this point in session. Oral care completed with supervision, pt asking therapist why the faucet wasn't turning off. She needed cues to use the handle on the Lt side. ?Lt inattention noted, difficult to assess due to pts cognitive deficits/disorganized motor planning/behavior. Throughout all activity, pt needed ample rest breaks, cues for hand placement during all transfers. She then requested to return to the bed. Min-Mod A for stand pivot<bed using RW. Max A to return to supine. Pt reported that she thought she had already eaten her jello but had not touched it. OT set her up. Pt also reported that she was unable to unpeel banana to  eat it, OT assisted her with this. Let nursing know that pt requires setup in order to eat her meals. Pt remained in bed at close of session, all needs within reach and bed alarm set.   Therapy Documentation Precautions:  Precautions Precautions: Fall Precaution Comments: fatigues quickly Restrictions Weight Bearing Restrictions: No Vital Signs: Therapy Vitals Temp: 97.9 F (36.6 C) Temp Source: Oral Pulse Rate: (!) 110 Resp: 18 BP: 119/86 Patient Position (if appropriate): Lying Oxygen Therapy SpO2: 97 % O2 Device: Room Air ADL: ADL Eating: Set up Where Assessed-Eating: Wheelchair Grooming: Supervision/safety Where Assessed-Grooming: Sitting at sink Upper Body Bathing: Supervision/safety Where Assessed-Upper Body Bathing: Sitting at sink Lower Body Bathing: Minimal assistance Where Assessed-Lower Body Bathing: Standing at sink Upper Body Dressing: Supervision/safety Where Assessed-Upper Body Dressing: Sitting at sink Lower Body Dressing: Minimal assistance Where Assessed-Lower Body Dressing: Edge of bed Toileting: Minimal assistance Where Assessed-Toileting: Teacher, adult education: Curator Method: Proofreader: Grab bars, Raised toilet seat Tub/Shower Transfer: Not assessed Film/video editor: Not assessed   Therapy/Group: Individual Therapy  Nakeshia Waldeck A Tynleigh Birt 04/19/2021, 4:08 PM

## 2021-04-18 NOTE — Progress Notes (Signed)
PROGRESS NOTE   Subjective/Complaints:  Pt reports  been on BP meds for years- concerned about this- explained her BP is low and when it improves, will restart BP meds.   MRI of brain (-)- so likely severe depression is causing her Sx's.    ROS:  Pt denies SOB, abd pain, CP, N/V/C/D, and vision changes    Objective:   MR BRAIN WO CONTRAST  Result Date: 04/17/2021 CLINICAL DATA:  Delirium; pt admitted after fall- on rehab- getting more and more confused- difficulty now with motor planning to understand to push w/c. EXAM: MRI HEAD WITHOUT CONTRAST TECHNIQUE: Multiplanar, multiecho pulse sequences of the brain and surrounding structures were obtained without intravenous contrast. COMPARISON:  Head CT March 26, 2021. FINDINGS: Brain: No acute infarction, hemorrhage, hydrocephalus, extra-axial collection or mass lesion. The brain parenchyma has normal signal characteristics. Mild parenchymal volume loss. Vascular: Normal flow voids. Skull and upper cervical spine: Normal marrow signal. Sinuses/Orbits: Negative. Other: Bilateral mastoid effusion, left greater than right. IMPRESSION: 1. No acute intracranial abnormality. 2. Mild parenchymal volume loss. 3. Mild bilateral mastoid effusion. Electronically Signed   By: Baldemar Lenis M.D.   On: 04/17/2021 14:23   Recent Labs    04/16/21 0903  WBC 5.7  HGB 12.6  HCT 36.0  PLT 228    Recent Labs    04/16/21 0903  NA 133*  K 4.0  CL 104  CO2 20*  GLUCOSE 99  BUN 14  CREATININE 1.10*  CALCIUM 9.0     Intake/Output Summary (Last 24 hours) at 04/18/2021 1107 Last data filed at 04/17/2021 1945 Gross per 24 hour  Intake 600 ml  Output --  Net 600 ml        Physical Exam: Vital Signs Blood pressure 119/79, pulse (!) 106, temperature 98 F (36.7 C), temperature source Oral, resp. rate 14, height 5\' 5"  (1.651 m), weight 72.5 kg, SpO2 97  %.        General: awake, alert, appropriate, sitting up in w/c; NAD HENT: conjugate gaze; oropharynx dry- facial swelling esp around eyes- stable CV: regular rate; no JVD Pulmonary: CTA B/L; no W/R/R- good air movement GI: soft, NT, ND, (+)BS Psychiatric: moe depressed- slightly more interactive today Neurological: poor STM- flat affect  Musculoskeletal:     Cervical back: Normal range of motion. No rigidity.     Comments: 4+/5 in arms proximal and distally B/L HF 4-/5; KE 4/5 DF/PF 4+/5 B/L  No swollen joints seen on exam  Skin:    Comments: Intact to light touch on exam B/L  Neurological:     Comments: Patient is alert.  Mood is a bit flat.  Light touch intact x all 4 extremities.    Assessment/Plan: 1. Functional deficits which require 3+ hours per day of interdisciplinary therapy in a comprehensive inpatient rehab setting. Physiatrist is providing close team supervision and 24 hour management of active medical problems listed below. Physiatrist and rehab team continue to assess barriers to discharge/monitor patient progress toward functional and medical goals  Care Tool:  Bathing    Body parts bathed by patient: Right arm, Left arm, Chest, Abdomen, Face, Right upper leg, Left  upper leg, Front perineal area   Body parts bathed by helper: Right lower leg, Left lower leg, Buttocks     Bathing assist Assist Level: Moderate Assistance - Patient 50 - 74%     Upper Body Dressing/Undressing Upper body dressing   What is the patient wearing?: Pull over shirt    Upper body assist Assist Level: Minimal Assistance - Patient > 75%    Lower Body Dressing/Undressing Lower body dressing      What is the patient wearing?: Pants, Underwear/pull up     Lower body assist Assist for lower body dressing: Moderate Assistance - Patient 50 - 74%     Toileting Toileting    Toileting assist Assist for toileting: Supervision/Verbal cueing     Transfers Chair/bed  transfer  Transfers assist     Chair/bed transfer assist level: Minimal Assistance - Patient > 75%     Locomotion Ambulation   Ambulation assist      Assist level: Minimal Assistance - Patient > 75% Assistive device: Walker-rolling Max distance: 75'   Walk 10 feet activity   Assist     Assist level: Minimal Assistance - Patient > 75% Assistive device: Walker-rolling   Walk 50 feet activity   Assist Walk 50 feet with 2 turns activity did not occur: Safety/medical concerns  Assist level: Minimal Assistance - Patient > 75% Assistive device: Walker-rolling    Walk 150 feet activity   Assist Walk 150 feet activity did not occur: Safety/medical concerns         Walk 10 feet on uneven surface  activity   Assist Walk 10 feet on uneven surfaces activity did not occur: Safety/medical concerns         Wheelchair     Assist Is the patient using a wheelchair?: Yes Type of Wheelchair: Manual    Wheelchair assist level: Total Assistance - Patient < 25% Max wheelchair distance: 150    Wheelchair 50 feet with 2 turns activity    Assist        Assist Level: Total Assistance - Patient < 25%   Wheelchair 150 feet activity     Assist      Assist Level: Total Assistance - Patient < 25%   Blood pressure 119/79, pulse (!) 106, temperature 98 F (36.7 C), temperature source Oral, resp. rate 14, height 5\' 5"  (1.651 m), weight 72.5 kg, SpO2 97 %.    Medical Problem List and Plan: 1.  Debility secondary to generalized pain/rheumatoid arthritis.  Follow-up outpatient rheumatology services Dr .  Prednisone taper as indicated             -patient may  shower             -ELOS/Goals: 5-7 days mod I  Continue CIR- PT, OT and SLP 2.  Antithrombotics: -DVT/anticoagulation:  Pharmaceutical: Lovenox             -antiplatelet therapy: n/a 3. Pain Management: Tramadol as needed  11/6- on stable prednisone dose- pain controlled- pt has moon facies  as side effect, will reduce to 5mg  in am   11.7- no change in pain- con't regimen  11/9- pt thinks reduction has made her more tired- will monitor- no pain 4. Mood: Wellbutrin 300 mg daily, Lexapro 20 mg daily             -antipsychotic agents: N/A 5. Neuropsych: This patient is? capable of making decisions on her own behalf. 6. Skin/Wound Care: Routine skin checks 7. Fluids/Electrolytes/Nutrition: Routine in and  outs with follow-up chemistries 8.  Hypertension.  Norvasc 5 mg daily, lisinopril 30 mg daily.  Monitor with increased mobility   Vitals:   04/17/21 1922 04/18/21 0322  BP: 118/73 119/79  Pulse: (!) 101 (!) 106  Resp: 14 14  Temp: 98 F (36.7 C) 98 F (36.7 C)  SpO2: 97% 97%    11/7- will stop Norvasc 5 mg daily as of Tuesday and recheck labs in Am to see if needs IVFs  11/8- will decrease Lisinopril from 20 mg daily to 5 mg daily and start ACE wraps and TEDs to knee B/L- also, push fluids- explained to pt needs 6-8 cups/water/day.   11/9- BP still low- but better in documentation, will hold Lisinopril completely tomorrow-   11/10- BP better today- off all BP meds  11/11- BP doing better- off BP meds- con't regimen 9.  Elevated LFTs/transaminitis- as well as CK.  Improved.  Follow-up chemistries  11/2- coming down- is 60/50 now- 10/24 was 150/135- so improving- con't to monitor  11/8- down to 50/41- con't monitoring 10.  Hyperlipidemia.  Lipitor held due to elevated LFTs 11. Poor energy  11/3- will check TSH and Vit D levels in AM- could be causing 11/6 vit D level mildly reduced will supplement 1000IU daily  12. Hx of depression  11/3- on wellbutrin and Lexapro- high doses- will con't  11/10- sister thinks she's bipolar- Will start Abilify 5 mg QHS as adjuvant- think could help pull her out of depressed mood and poor initiation and now cognitive issues?.   11/11- MRI (-) for stroke, etc- so will continue down the road that this is likely severe depression per d/w sister  who agrees.  13. Hypotension  11/8- dramatically decrease lisinopril and add ACE wraps/TEDs and have pt drink more- per CMP, not dry, so wait on IVFs.  14. Moderate to severe malnutrition  11/9- have ordered dietician consult Since pt's albumin is dropping.      LOS: 10 days A FACE TO FACE EVALUATION WAS PERFORMED  Carrie [name] 04/18/2021, 11:07 AM

## 2021-04-18 NOTE — Progress Notes (Signed)
Physical Therapy Note  Patient Details  Name: Carrie [name] MRN: [id number] Date of Birth: 29-Dec-1960 Today's Date: 04/18/2021    Pt refused scheduled PT treatment d/t nausea and fatigue. Pt declined any repositioning or toileting. Therapist checked back in 30 min after appointment time and pt stated, "what do you want now?" And continued to refuse therapy. This therapist will continue to follow up as able. Pt missed 75 min of scheduled therapy d/t nausea and fatigue.    Carrie [name] 04/18/2021, 10:03 AM

## 2021-04-19 DIAGNOSIS — R5381 Other malaise: Secondary | ICD-10-CM | POA: Diagnosis not present

## 2021-04-19 NOTE — Progress Notes (Signed)
PROGRESS NOTE   Subjective/Complaints:  Pt reports still severely exhausted- don't see a reason other than depression and mild low Vit D.  Slept well, but still tired.  Drank 1/2 to 2/3 of supplement- nibbling at breakfast- eaten very little.  Doesn't know how much she's drinking, but knows has to drink 6 + cups/day.    ROS:   Pt denies SOB, abd pain, CP, N/V/C/D, and vision changes    Objective:   MR BRAIN WO CONTRAST  Result Date: 04/17/2021 CLINICAL DATA:  Delirium; pt admitted after fall- on rehab- getting more and more confused- difficulty now with motor planning to understand to push w/c. EXAM: MRI HEAD WITHOUT CONTRAST TECHNIQUE: Multiplanar, multiecho pulse sequences of the brain and surrounding structures were obtained without intravenous contrast. COMPARISON:  Head CT March 26, 2021. FINDINGS: Brain: No acute infarction, hemorrhage, hydrocephalus, extra-axial collection or mass lesion. The brain parenchyma has normal signal characteristics. Mild parenchymal volume loss. Vascular: Normal flow voids. Skull and upper cervical spine: Normal marrow signal. Sinuses/Orbits: Negative. Other: Bilateral mastoid effusion, left greater than right. IMPRESSION: 1. No acute intracranial abnormality. 2. Mild parenchymal volume loss. 3. Mild bilateral mastoid effusion. Electronically Signed   By: Baldemar Lenis M.D.   On: 04/17/2021 14:23   No results for input(s): WBC, HGB, HCT, PLT in the last 72 hours.   No results for input(s): NA, K, CL, CO2, GLUCOSE, BUN, CREATININE, CALCIUM in the last 72 hours.    Intake/Output Summary (Last 24 hours) at 04/19/2021 1201 Last data filed at 04/19/2021 1000 Gross per 24 hour  Intake 360 ml  Output --  Net 360 ml        Physical Exam: Vital Signs Blood pressure 106/85, pulse (!) 103, temperature 98.3 F (36.8 C), resp. rate 17, height 5\' 5"  (1.651 m), weight 72.5 kg,  SpO2 97 %.         General: awake, alert, appropriate, picking at breakfast; not eating much; NAD HENT: conjugate gaze; oropharynx moist CV: regular rate; no JVD Pulmonary: CTA B/L; no W/R/R- good air movement GI: soft, NT, ND, (+)BS- hypoactive even when eating something Psychiatric:very depressed/flat affect; poor energy; almost lethargic Neurological: poor STM  Musculoskeletal:     Cervical back: Normal range of motion. No rigidity.     Comments: 4+/5 in arms proximal and distally B/L HF 4-/5; KE 4/5 DF/PF 4+/5 B/L  No swollen joints seen on exam  Skin:    Comments: Intact to light touch on exam B/L  Neurological:     Comments: Patient is alert.  Mood is a bit flat.  Light touch intact x all 4 extremities.    Assessment/Plan: 1. Functional deficits which require 3+ hours per day of interdisciplinary therapy in a comprehensive inpatient rehab setting. Physiatrist is providing close team supervision and 24 hour management of active medical problems listed below. Physiatrist and rehab team continue to assess barriers to discharge/monitor patient progress toward functional and medical goals  Care Tool:  Bathing    Body parts bathed by patient: Right arm, Left arm, Chest, Abdomen, Face, Right upper leg, Left upper leg, Front perineal area   Body parts bathed by helper:  Right lower leg, Left lower leg, Buttocks     Bathing assist Assist Level: Moderate Assistance - Patient 50 - 74%     Upper Body Dressing/Undressing Upper body dressing   What is the patient wearing?: Pull over shirt    Upper body assist Assist Level: Minimal Assistance - Patient > 75%    Lower Body Dressing/Undressing Lower body dressing      What is the patient wearing?: Pants, Underwear/pull up     Lower body assist Assist for lower body dressing: Maximal Assistance - Patient 25 - 49%     Toileting Toileting    Toileting assist Assist for toileting: Moderate Assistance - Patient 50 -  74%     Transfers Chair/bed transfer  Transfers assist     Chair/bed transfer assist level: Minimal Assistance - Patient > 75%     Locomotion Ambulation   Ambulation assist      Assist level: Minimal Assistance - Patient > 75% Assistive device: Walker-rolling Max distance: 75'   Walk 10 feet activity   Assist     Assist level: Minimal Assistance - Patient > 75% Assistive device: Walker-rolling   Walk 50 feet activity   Assist Walk 50 feet with 2 turns activity did not occur: Safety/medical concerns  Assist level: Minimal Assistance - Patient > 75% Assistive device: Walker-rolling    Walk 150 feet activity   Assist Walk 150 feet activity did not occur: Safety/medical concerns         Walk 10 feet on uneven surface  activity   Assist Walk 10 feet on uneven surfaces activity did not occur: Safety/medical concerns         Wheelchair     Assist Is the patient using a wheelchair?: Yes Type of Wheelchair: Manual    Wheelchair assist level: Total Assistance - Patient < 25% Max wheelchair distance: 150    Wheelchair 50 feet with 2 turns activity    Assist        Assist Level: Total Assistance - Patient < 25%   Wheelchair 150 feet activity     Assist      Assist Level: Total Assistance - Patient < 25%   Blood pressure 106/85, pulse (!) 103, temperature 98.3 F (36.8 C), resp. rate 17, height 5\' 5"  (1.651 m), weight 72.5 kg, SpO2 97 %.    Medical Problem List and Plan: 1.  Debility secondary to generalized pain/rheumatoid arthritis.  Follow-up outpatient rheumatology services Dr .  Prednisone taper as indicated             -patient may  shower             -ELOS/Goals: 5-7 days mod I  Continue CIR- PT, OT and SLP  2.  Antithrombotics: -DVT/anticoagulation:  Pharmaceutical: Lovenox             -antiplatelet therapy: n/a 3. Pain Management: Tramadol as needed  11/6- on stable prednisone dose- pain controlled- pt has  moon facies as side effect, will reduce to 5mg  in am   11.7- no change in pain- con't regimen  11/9- pt thinks reduction has made her more tired- will monitor- no pain 4. Mood: Wellbutrin 300 mg daily, Lexapro 20 mg daily  11/12- added Abilify 5 mg QHS - con't regimen             -antipsychotic agents: N/A 5. Neuropsych: This patient is? capable of making decisions on her own behalf. 6. Skin/Wound Care: Routine skin checks 7. Fluids/Electrolytes/Nutrition: Routine in  and outs with follow-up chemistries 8.  Hypertension.  Norvasc 5 mg daily, lisinopril 30 mg daily.  Monitor with increased mobility   Vitals:   04/18/21 2014 04/19/21 0507  BP: 106/75 106/85  Pulse: (!) 103 (!) 103  Resp: 18 17  Temp: 98 F (36.7 C) 98.3 F (36.8 C)  SpO2: 97% 97%    11/7- will stop Norvasc 5 mg daily as of Tuesday and recheck labs in Am to see if needs IVFs  11/8- will decrease Lisinopril from 20 mg daily to 5 mg daily and start ACE wraps and TEDs to knee B/L- also, push fluids- explained to pt needs 6-8 cups/water/day.   11/9- BP still low- but better in documentation, will hold Lisinopril completely tomorrow-   11/12- BP better off BP meds- still soft- likely due to poor appetite.  9.  Elevated LFTs/transaminitis- as well as CK.  Improved.  Follow-up chemistries  11/2- coming down- is 60/50 now- 10/24 was 150/135- so improving- con't to monitor  11/8- down to 50/41- con't monitoring 10.  Hyperlipidemia.  Lipitor held due to elevated LFTs 11. Poor energy  11/3- will check TSH and Vit D levels in AM- could be causing 11/6 vit D level mildly reduced will supplement 1000IU daily  12. Hx of depression  11/3- on wellbutrin and Lexapro- high doses- will con't  11/10- sister thinks she's bipolar- Will start Abilify 5 mg QHS as adjuvant- think could help pull her out of depressed mood and poor initiation and now cognitive issues?.   11/11- MRI (-) for stroke, etc- so will continue down the road that this is  likely severe depression per d/w sister who agrees.  13. Hypotension  11/8- dramatically decrease lisinopril and add ACE wraps/TEDs and have pt drink more- per CMP, not dry, so wait on IVFs.  14. Moderate to severe malnutrition  11/9- have ordered dietician consult Since pt's albumin is dropping.      LOS: 11 days A FACE TO FACE EVALUATION WAS PERFORMED  Somya Jauregui 04/19/2021, 12:01 PM

## 2021-04-20 MED ORDER — POLYETHYLENE GLYCOL 3350 17 G PO PACK: 17.0000 g | PACK | Freq: Every day | ORAL | Status: DC | PRN

## 2021-04-20 MED ORDER — SENNOSIDES-DOCUSATE SODIUM 8.6-50 MG PO TABS
1.0000 | ORAL_TABLET | Freq: Two times a day (BID) | ORAL | Status: DC
  Administered 2021-04-20 – 2021-04-30 (×21): 1 via ORAL
  Filled 2021-04-20 (×21): qty 1

## 2021-04-20 NOTE — Progress Notes (Signed)
Physical Therapy Session Note  Patient Details  Name: Carrie [name] MRN: [id number] Date of Birth: [birth year]  Today's Date: 04/20/2021 PT Individual Time: 1100-1130 PT Individual Time Calculation (min): 30 min   Short Term Goals: Week 2:  PT Short Term Goal 1 (Week 2): Pt will ambulate >25 ft conistently PT Short Term Goal 2 (Week 2): Pt will propell w/c >50 ft PT Short Term Goal 3 (Week 2): Pt will initiate stair training  Skilled Therapeutic Interventions/Progress Updates: Pt presents supine in bed and reluctantly agreeable to therapy.  Total A for donning TEDS and ace wraps in supine.  Pt transferred sup to sit slowly and requires min A for final 1/2 of transfer.  Pt scoots self to EOB.  Pt transferred sit to stand w/min to mod A and then Step-pivot to w/c w/ min A and verbal cues for foot clearance.  Pt requests female to take into BR, NT performed.  Pt amb w/ RW and min A x 15' around bed to recliner.  Pt remained sitting in recliner w/ chair alarm on and all needs in reach, NT aware of position.     Therapy Documentation Precautions:  Precautions Precautions: Fall Precaution Comments: fatigues quickly Restrictions Weight Bearing Restrictions: No General:   Vital Signs:  Pain:0/10         Therapy/Group: Individual Therapy  Lucio Edward 04/20/2021, 11:30 AM

## 2021-04-21 DIAGNOSIS — W108XXD Fall (on) (from) other stairs and steps, subsequent encounter: Secondary | ICD-10-CM

## 2021-04-21 DIAGNOSIS — R410 Disorientation, unspecified: Secondary | ICD-10-CM

## 2021-04-21 DIAGNOSIS — R5381 Other malaise: Secondary | ICD-10-CM | POA: Diagnosis not present

## 2021-04-21 DIAGNOSIS — M069 Rheumatoid arthritis, unspecified: Secondary | ICD-10-CM

## 2021-04-21 MED ORDER — ENSURE ENLIVE PO LIQD
237.0000 mL | Freq: Three times a day (TID) | ORAL | Status: DC
  Administered 2021-04-21 – 2021-04-30 (×24): 237 mL via ORAL

## 2021-04-21 NOTE — Progress Notes (Signed)
Speech Language Pathology Daily Session Note  Patient Details  Name: Carrie [name] MRN: [id number] Date of Birth: 1960-12-02  Today's Date: 04/21/2021 SLP Individual Time: 1331-1415 SLP Individual Time Calculation (min): 44 min  Short Term Goals: Week 1: SLP Short Term Goal 1 (Week 1): Patient will perform simulated medication organization/pill box task with minA cues for accuracy. SLP Short Term Goal 2 (Week 1): Patient will perform alternating attention tasks in quite or mildly distracting environments with minA verbal redirection cues. SLP Short Term Goal 3 (Week 1): Patient will demonstrate adequate recall of daily events (therapeutic, nursing, etc) with minA cues for compensating (memory notebook, etc). SLP Short Term Goal 4 (Week 1): Patient will SLP Short Term Goal 5 (Week 1): Patient will demonstrate adequate awareness to errors and attempt to self-correct during functional tasks, with minA verbal, visual cues.  Skilled Therapeutic Interventions: Pt seen for skilled ST with focus on cognitive goals, pt in bed and agreeable to therapeutic tasks. Pt oriented to [employer] and general rationale behind hospital stay, but required min cues for orientation to location, DOW, date, and year. Pt stating it was 2002 and reporting today was the 23rd (likely confused by seeing 11/23 on board as d/c date). Pt provided calendar hung on wall to increase recall and carryover of orientation concepts. Pt also provided memory notebook left on table and encouraged pt to write important information down (conversations with staff, questions for MD, important phone numbers, etc). Pt with difficulty utilizing cell phone, not interested in SLP help sequencing this date. Pt reports feeling "so tired" all of the time, mildly upset that staff keeps mentioning "nursing home" if she doesn't increase participation. Pt benefits from min A verbal cues to increase awareness of errors during cognitive tasks today. Pt left in bed  with alarm set and all needs within reach. Cont ST POC.    Pain Pain Assessment Pain Scale: 0-10 Pain Score: 0-No pain  Therapy/Group: Individual Therapy  Tacey Ruiz 04/21/2021, 2:04 PM

## 2021-04-21 NOTE — Progress Notes (Signed)
Physical Therapy Session Note  Patient Details  Name: Carrie [name] MRN: [id number] Date of Birth: 02/10/1961  Today's Date: 04/21/2021 PT Individual Time: 0800-0930 PT Individual Time Calculation (min): 90 min   Short Term Goals: Week 1:  PT Short Term Goal 1 (Week 1): Patient will complete bed mobility with CGA consistently PT Short Term Goal 2 (Week 1): Patient will transfer bed <> wc with LRAD and CGA consistently PT Short Term Goal 2 - Progress (Week 1): Not met PT Short Term Goal 3 (Week 1): Patient will ambulate >25ft with LRAD and CGA PT Short Term Goal 3 - Progress (Week 1): Not met Week 2:  PT Short Term Goal 1 (Week 2): Pt will ambulate >25 ft conistently PT Short Term Goal 2 (Week 2): Pt will propell w/c >50 ft PT Short Term Goal 3 (Week 2): Pt will initiate stair training  Skilled Therapeutic Interventions/Progress Updates:    pt received in bed and agreeable to therapy. No complaint of pain at rest, but stated "my butt hurts" after taking several steps, unrated. Clarified muscle pain and not anal pain. Donned ted hose and ace wraps tot A in supine. Supine>sit with mod A for LE management and to bring trunk upright. VC and mod A for improved scooting to EOB.  Pt requested to change clothes. Sit to stand with mod A to power up, max A to maintain balance d/t new retropulsion. Pt took several steps with heavy mod A for balance over to dresser to pick out clothes but insisted she needed to sit d/t buttock pain and demoed unsafe behavior while turning to look for chair. Pt picked out clothes at w/c level. Pt began to don clean pants before doffing current pair. When asked what she needed to do before putting on pants, she stated she needed to wash up. Pt required mod A to doff shirt, needing assist to initiate pulling off sleeves. Pt washed up and brushed teeth with set up and supervision at w/c level. Pt changed her mind about changing pants, demoed some confusion about where she had  left her jacket, thinking the pants therapist laid on chair were her jacket. Pt was transported to [place] for therapeutic benefit of sunshine. Pt ambulated x 24 ft, x 38 ft, x 92 ft with mod A for balance and RW. Pt continued to require min-mod A at times to stand from w/c and min a for balance, VC for incr step length, walker proximity, and upright posture, extended seated rest breaks d/t fatigue. Pt returned to room and performed ambulatory transfer to recliner in the same manner. Pt was left with all needs in reach and alarm active.   Therapy Documentation Precautions:  Precautions Precautions: Fall Precaution Comments: fatigues quickly Restrictions Weight Bearing Restrictions: No    Therapy/Group: Individual Therapy   C  04/21/2021, 8:31 AM  

## 2021-04-21 NOTE — Progress Notes (Signed)
PROGRESS NOTE   Subjective/Complaints:   Pt reports she ate breakfast- appears <25% and didn't drink supplement- will get her one.   Pt still reports she's tired/exhausted, but slept well.  "Better than yesterday" but upset she has therapy today- explained she needs to participate FULLY with therapy or is going to nursing home.     ROS:  Pt denies SOB, abd pain, CP, N/V/C/D, and vision changes   Objective:   No results found. No results for input(s): WBC, HGB, HCT, PLT in the last 72 hours.   No results for input(s): NA, K, CL, CO2, GLUCOSE, BUN, CREATININE, CALCIUM in the last 72 hours.    Intake/Output Summary (Last 24 hours) at 04/21/2021 0852 Last data filed at 04/20/2021 1258 Gross per 24 hour  Intake 480 ml  Output --  Net 480 ml        Physical Exam: Vital Signs Blood pressure 101/77, pulse 100, temperature 97.8 F (36.6 C), resp. rate 16, height 5\' 5"  (1.651 m), weight 72.5 kg, SpO2 95 %.          General: awake, alert, appropriate, flat; picking at food, barely; tray <25% NAD HENT: conjugate gaze; oropharynx moist CV: regular rate; no JVD Pulmonary: CTA B/L; no W/R/R- good air movement GI: soft, NT, ND, (+)BS Psychiatric: appropriate; flat; depressed Neurological: STM issues   Musculoskeletal:     Cervical back: Normal range of motion. No rigidity.     Comments: 4+/5 in arms proximal and distally B/L HF 4-/5; KE 4/5 DF/PF 4+/5 B/L  No swollen joints seen on exam  Skin:    Comments: Intact to light touch on exam B/L  Light touch intact x all 4 extremities.    Assessment/Plan: 1. Functional deficits which require 3+ hours per day of interdisciplinary therapy in a comprehensive inpatient rehab setting. Physiatrist is providing close team supervision and 24 hour management of active medical problems listed below. Physiatrist and rehab team continue to assess barriers to  discharge/monitor patient progress toward functional and medical goals  Care Tool:  Bathing    Body parts bathed by patient: Right arm, Left arm, Chest, Abdomen, Face, Right upper leg, Left upper leg, Front perineal area   Body parts bathed by helper: Right lower leg, Left lower leg, Buttocks     Bathing assist Assist Level: Moderate Assistance - Patient 50 - 74%     Upper Body Dressing/Undressing Upper body dressing   What is the patient wearing?: Pull over shirt    Upper body assist Assist Level: Minimal Assistance - Patient > 75%    Lower Body Dressing/Undressing Lower body dressing      What is the patient wearing?: Pants, Underwear/pull up     Lower body assist Assist for lower body dressing: Maximal Assistance - Patient 25 - 49%     Toileting Toileting    Toileting assist Assist for toileting: Moderate Assistance - Patient 50 - 74%     Transfers Chair/bed transfer  Transfers assist     Chair/bed transfer assist level: Minimal Assistance - Patient > 75%     Locomotion Ambulation   Ambulation assist      Assist level: Minimal Assistance -  Patient > 75% Assistive device: Walker-rolling Max distance: 15   Walk 10 feet activity   Assist     Assist level: Minimal Assistance - Patient > 75% Assistive device: Walker-rolling   Walk 50 feet activity   Assist Walk 50 feet with 2 turns activity did not occur: Safety/medical concerns  Assist level: Minimal Assistance - Patient > 75% Assistive device: Walker-rolling    Walk 150 feet activity   Assist Walk 150 feet activity did not occur: Safety/medical concerns         Walk 10 feet on uneven surface  activity   Assist Walk 10 feet on uneven surfaces activity did not occur: Safety/medical concerns         Wheelchair     Assist Is the patient using a wheelchair?: Yes Type of Wheelchair: Manual    Wheelchair assist level: Total Assistance - Patient < 25% Max wheelchair  distance: 150    Wheelchair 50 feet with 2 turns activity    Assist        Assist Level: Total Assistance - Patient < 25%   Wheelchair 150 feet activity     Assist      Assist Level: Total Assistance - Patient < 25%   Blood pressure 101/77, pulse 100, temperature 97.8 F (36.6 C), resp. rate 16, height 5\' 5"  (1.651 m), weight 72.5 kg, SpO2 95 %.    Medical Problem List and Plan: 1.  Debility secondary to generalized pain/rheumatoid arthritis.  Follow-up outpatient rheumatology services Dr .  Prednisone taper as indicated             -patient may  shower             -ELOS/Goals: 5-7 days mod I  Continue CIR- PT, OT and SLP 2.  Antithrombotics: -DVT/anticoagulation:  Pharmaceutical: Lovenox             -antiplatelet therapy: n/a 3. Pain Management: Tramadol as needed  11/6- on stable prednisone dose- pain controlled- pt has moon facies as side effect, will reduce to 5mg  in am   11.7- no change in pain- con't regimen  11/14- pain controlled- con't regimen 4. Mood: Wellbutrin 300 mg daily, Lexapro 20 mg daily  11/12- added Abilify 5 mg QHS - con't regimen  11/14- seeing neuropsych today- to verify the dx. Might think about adding Ritalin to jump start thing.              -antipsychotic agents: N/A 5. Neuropsych: This patient is? capable of making decisions on her own behalf. 6. Skin/Wound Care: Routine skin checks 7. Fluids/Electrolytes/Nutrition: Routine in and outs with follow-up chemistries 8.  Hypertension.  Norvasc 5 mg daily, lisinopril 30 mg daily.  Monitor with increased mobility   Vitals:   04/21/21 0416 04/21/21 0430  BP: 90/67 101/77  Pulse: (!) 101 100  Resp:  16  Temp: 97.7 F (36.5 C) 97.8 F (36.6 C)  SpO2: 97% 95%    11/7- will stop Norvasc 5 mg daily as of Tuesday and recheck labs in Am to see if needs IVFs  11/8- will decrease Lisinopril from 20 mg daily to 5 mg daily and start ACE wraps and TEDs to knee B/L- also, push fluids- explained  to pt needs 6-8 cups/water/day.   11/9- BP still low- but better in documentation, will hold Lisinopril completely tomorrow-   11/12- BP better off BP meds- still soft- likely due to poor appetite.  9.  Elevated LFTs/transaminitis- as well as CK.  Improved.  Follow-up chemistries  11/2- coming down- is 60/50 now- 10/24 was 150/135- so improving- con't to monitor  11/8- down to 50/41- con't monitoring 10.  Hyperlipidemia.  Lipitor held due to elevated LFTs 11. Poor energy  11/3- will check TSH and Vit D levels in AM- could be causing 11/6 vit D level mildly reduced will supplement 1000IU daily  12. Hx of depression  11/3- on wellbutrin and Lexapro- high doses- will con't  11/10- sister thinks she's bipolar- Will start Abilify 5 mg QHS as adjuvant- think could help pull her out of depressed mood and poor initiation and now cognitive issues?.   11/11- MRI (-) for stroke, etc- so will continue down the road that this is likely severe depression per d/w sister who agrees.   11/14- seeing neuropsych today- will think about Ritalin for initiation.  13. Hypotension  11/8- dramatically decrease lisinopril and add ACE wraps/TEDs and have pt drink more- per CMP, not dry, so wait on IVFs.  14. Moderate to severe malnutrition  11/9- have ordered dietician consult Since pt's albumin is dropping.   11/14- pt still not eating- will verify has supplements 3x/day.      LOS: 13 days A FACE TO FACE EVALUATION WAS PERFORMED  Carrie [name] 04/21/2021, 8:52 AM

## 2021-04-21 NOTE — Progress Notes (Signed)
Occupational Therapy Session Note  Patient Details  Name: Carrie [name] MRN: [id number] Date of Birth: November 21, 1960  Today's Date: 04/21/2021 OT Individual Time: 5366-4403 OT Individual Time Calculation (min): 58 min    Short Term Goals: Week 1:  OT Short Term Goal 1 (Week 1): Pt will indepedently verbalize at least 2 energy conservation strategies to implement into ADL. OT Short Term Goal 1 - Progress (Week 1): Progressing toward goal OT Short Term Goal 2 (Week 1): Pt will don pants with CGA and AE PRN. OT Short Term Goal 2 - Progress (Week 1): Progressing toward goal OT Short Term Goal 3 (Week 1): Pt will complete shower transfer with CGA and LRAD. OT Short Term Goal 3 - Progress (Week 1): Progressing toward goal Week 2:  OT Short Term Goal 1 (Week 2): Pt will perform shower level bathing with no more than Min A and AE PRN OT Short Term Goal 2 (Week 2): Pt will ambulate to/from bathroom with LRAD Supervision OT Short Term Goal 3 (Week 2): Pt will utilize energy conservation techniques during ADL with no more than Min verbal cues   Skilled Therapeutic Interventions/Progress Updates:    Pt greeted at time of session supine in bed resting agreeable to OT session with encouragement, no pain at rest. Pt performing supine > sit Mod A and stand pivot bed > wheelchair initially Min but increasing assist as pt with posterior lean and unable to correct, therapist guiding to chair. Attempted to have the pt self propel wheelchair with max multimodal cues, unable to fully self propel, needing Mod/Max to propel wheelchair. Set up at [facility] and only able to tolerate level 1 for 5 minutes with very slow pace and cues for encouragement to improve global endurance. Pt transported back to room and sister present, discussed CLOF and OT goals. Also provided patient and sister with home measurement sheet and description of door widths needed. Stand pivot wheelchair > bed with Min A with RW. Alarm on call bell in  reach.   Therapy Documentation Precautions:  Precautions Precautions: Fall Precaution Comments: fatigues quickly Restrictions Weight Bearing Restrictions: No     Therapy/Group: Individual Therapy  Erasmo Score 04/21/2021, 7:29 AM

## 2021-04-22 DIAGNOSIS — R5381 Other malaise: Secondary | ICD-10-CM | POA: Diagnosis not present

## 2021-04-22 DIAGNOSIS — R410 Disorientation, unspecified: Secondary | ICD-10-CM

## 2021-04-22 LAB — CBC WITH DIFFERENTIAL/PLATELET
Abs Immature Granulocytes: 0.03 10*3/uL (ref 0.00–0.07)
Basophils Absolute: 0 10*3/uL (ref 0.0–0.1)
Basophils Relative: 0 %
Eosinophils Absolute: 0 10*3/uL (ref 0.0–0.5)
Eosinophils Relative: 0 %
HCT: 33.2 % — ABNORMAL LOW (ref 36.0–46.0)
Hemoglobin: 11.3 g/dL — ABNORMAL LOW (ref 12.0–15.0)
Immature Granulocytes: 1 %
Lymphocytes Relative: 13 %
Lymphs Abs: 0.7 10*3/uL (ref 0.7–4.0)
MCH: 31 pg (ref 26.0–34.0)
MCHC: 34 g/dL (ref 30.0–36.0)
MCV: 91.2 fL (ref 80.0–100.0)
Monocytes Absolute: 0.4 10*3/uL (ref 0.1–1.0)
Monocytes Relative: 7 %
Neutro Abs: 4.2 10*3/uL (ref 1.7–7.7)
Neutrophils Relative %: 79 %
Platelets: 227 10*3/uL (ref 150–400)
RBC: 3.64 MIL/uL — ABNORMAL LOW (ref 3.87–5.11)
RDW: 14.4 % (ref 11.5–15.5)
WBC: 5.4 10*3/uL (ref 4.0–10.5)
nRBC: 0 % (ref 0.0–0.2)

## 2021-04-22 LAB — BASIC METABOLIC PANEL
Anion gap: 9 (ref 5–15)
BUN: 15 mg/dL (ref 8–23)
CO2: 23 mmol/L (ref 22–32)
Calcium: 9.2 mg/dL (ref 8.9–10.3)
Chloride: 104 mmol/L (ref 98–111)
Creatinine, Ser: 0.94 mg/dL (ref 0.44–1.00)
GFR, Estimated: >60 mL/min (ref >60)
Glucose, Bld: 89 mg/dL (ref 70–99)
Potassium: 4.1 mmol/L (ref 3.5–5.1)
Sodium: 136 mmol/L (ref 135–145)

## 2021-04-22 MED ORDER — PROSOURCE PLUS PO LIQD
30.0000 mL | Freq: Two times a day (BID) | ORAL | Status: DC
Start: 2021-04-22 — End: 2021-04-30
  Administered 2021-04-22 – 2021-04-30 (×15): 30 mL via ORAL
  Filled 2021-04-22 (×15): qty 30

## 2021-04-22 MED ORDER — METHYLPHENIDATE HCL 5 MG PO TABS
5.0000 mg | ORAL_TABLET | Freq: Two times a day (BID) | ORAL | Status: DC
  Administered 2021-04-22 – 2021-04-25 (×6): 5 mg via ORAL
  Filled 2021-04-22 (×6): qty 1

## 2021-04-22 NOTE — Progress Notes (Signed)
Patient ID: Carrie [name], female   DOB: 1960-12-01, 60 y.o.   MRN: [id number]  Met with pt and sister-Sandra was present in her room to update them on team conference progress this week and her need to drink and eat due to becoming dehydrated and declining in her function. She reports she will do better and sister brought her food from outside and she is eating it. Discussed with both team recommends 24/7 care at discharge. [name] reports will have someone there until she is in the bed for the night. Discharge date still 11/23. Pt feels her insurance will pay for someone to assist her. They called her, will look into this. Pt will need all of the resources she can get going home. PT was there and pt went to therapies. Continue to work on discharge needs.

## 2021-04-22 NOTE — Progress Notes (Signed)
Physical Therapy Session Note  Patient Details  Name: Carrie [name] MRN: [id number] Date of Birth: 1961-05-08  Today's Date: 04/22/2021 PT Individual Time: 1300-1355 PT Individual Time Calculation (min): 55 min   Short Term Goals: Week 1:  PT Short Term Goal 1 (Week 1): Patient will complete bed mobility with CGA consistently PT Short Term Goal 2 (Week 1): Patient will transfer bed <> wc with LRAD and CGA consistently PT Short Term Goal 2 - Progress (Week 1): Not met PT Short Term Goal 3 (Week 1): Patient will ambulate >73f with LRAD and CGA PT Short Term Goal 3 - Progress (Week 1): Not met Week 2:  PT Short Term Goal 1 (Week 2): Pt will ambulate >25 ft conistently PT Short Term Goal 2 (Week 2): Pt will propell w/c >50 ft PT Short Term Goal 3 (Week 2): Pt will initiate stair training  Skilled Therapeutic Interventions/Progress Updates:    pt received in bed and agreeable to therapy with her sister present.  Min A supine> sit. Mod A Sit to stand d/t heavy posterior lean, improved to heavy min by end of session.  Gait 2 x 85 ft with mod A and RW, w/c follow for safety. Freq cueing for incr step length, upright posture, RW proximity, and max encouragement. Pt demoes unsafe behavior when fatigued. Nustep x 6 min at level 3 for improved endurance and LE strength, attempting to self terminate activity but continued when reminded when we would finish. Pt requested to stay in w/c at end of session and was left with all needs in reach and alarm active.   Therapy Documentation Precautions:  Precautions Precautions: Fall Precaution Comments: fatigues quickly Restrictions Weight Bearing Restrictions: No General:      Therapy/Group: Individual Therapy  Carrie MICUCCI11/15/2022, 7:59 AM

## 2021-04-22 NOTE — Patient Care Conference (Signed)
Inpatient RehabilitationTeam Conference and Plan of Care Update Date: 04/22/2021   Time: 11:51 AM    Patient Name: Carrie [name]      Medical Record Number: [id number]  Date of Birth: Jun 20, 1960 Sex: Female         Room/Bed: 4W24C/4W24C-01 Payor Info: Payor: [employer] / Plan: BC[employer] PPO / Product Type: *No Product type* /    Admit Date/Time:  04/08/2021 11:44 AM  Primary Diagnosis:  Debility  Hospital Problems: Principal Problem:   Debility Active Problems:   Rheumatoid arthritis flare (HCC)   Fall (on) (from) other stairs and steps, subsequent encounter   Confusion    Expected Discharge Date: Expected Discharge Date: 04/30/21  Team Members Present: Physician leading conference: Dr. Genice Rouge Social Worker Present: Dossie Der, LCSW Nurse Present: Kennyth Arnold, RN PT Present: Bernie Covey, PT OT Present: Earleen Newport, OT SLP Present: Gerda Diss, SLP PPS Coordinator present : Edson Snowball, PT     Current Status/Progress Goal Weekly Team Focus  Bowel/Bladder   continent of B&B. LBM 04/21/21  Remain continent.  assess Q shift and PRN   Swallow/Nutrition/ Hydration             ADL's   Min/Mod LB ADLs, posterior lean, Min/Mod sit <> stands, Min stand pivot, Min/Mod toileting, cognition and fatigue limiting  Supervision  endurance/tolerance, OOB activity, UB strength, ADL retraining, functional mobility, energy conservation   Mobility   min A gait up to 92 ft with RW, min- mod A at times for transfers. Physical and cognitive decline noted.  (P) downgraded to CGA-min A  endurance, gait, functional mobility   Communication             Safety/Cognition/ Behavioral Observations  min A cog/memory  downgrade to SPV  external aids for memory, executive function skills   Pain   Denies pain  Remain pain free.  Assess Q shift and PRN   Skin   non pressure injury to left buttock  Prevent new skin breakdowns.  assess Q shift and PRN      Discharge Planning:  Family will need to come up with a plan for pt she has no benefit from SNF. Pt feels she is doing better but will need supervision at DC, sister aware of this recommendation   Team Discussion: Added Ritalin, not eating well, poor memory. Not dehydrated but tenting. Started Abilify. Continent B/B, no reported pain. No benefits for SNF. Cognition worse, presents now with posterior lean. MRI looked good. Has baseline tremors. Need to push fluids. Patient on target to meet rehab goals: Has supervision goals and are not appropriate. Will downgrade goals. SLP downgraded to supervision.  *See Care Plan and progress notes for long and short-term goals.   Revisions to Treatment Plan:  Adjusting medications  Teaching Needs: Family education, medication management, skin/wound care, safety awareness, transfer training, etc.  Current Barriers to Discharge: Decreased caregiver support, Home enviroment access/layout, Lack of/limited family support, Insurance for SNF coverage, Medication compliance, and Behavior  Possible Resolutions to Barriers: Family education Possible peg placement for nutritional support Order DME     Medical Summary Current Status: STM issues-continent; nonpressure wound on buttocks; no pain;  Barriers to Discharge: Behavior;Decreased family/caregiver support;Home enviroment access/layout;Medical stability;Medication compliance;Insurance for SNF coverage;Nutrition means;Weight  Barriers to Discharge Comments: has to go home- because no SNF benefits- if anything going downhill- cognition bad Possible Resolutions to [employer] Focus: new posterior lean; so depressed, not eating/drinking- might need PEG?; appetite stimulants  will cause sedation more than now; no energy; downgrade all goals- have to push to eat- needs prompts- all over the place- cognition- baseline tremor; d/c- 11/23 d/c- canot move, since not improving.   Continued Need for Acute  Rehabilitation Level of Care: The patient requires daily medical management by a physician with specialized training in physical medicine and rehabilitation for the following reasons: Direction of a multidisciplinary physical rehabilitation program to maximize functional independence : Yes Medical management of patient stability for increased activity during participation in an intensive rehabilitation regime.: Yes Analysis of laboratory values and/or radiology reports with any subsequent need for medication adjustment and/or medical intervention. : Yes   I attest that I was present, lead the team conference, and concur with the assessment and plan of the team.   Tennis Must 04/22/2021, 4:41 PM

## 2021-04-22 NOTE — Progress Notes (Signed)
Nutrition Follow-up  DOCUMENTATION CODES:   Not applicable  INTERVENTION:  Continue Ensure Enlive po TID, each supplement provides 350 kcal and 20 grams of protein  Provide 30 ml Prosource Plus po BID, each supplement provides 100 kcal and 15 grams of protein.   Encourage PO intake.   NUTRITION DIAGNOSIS:   Inadequate oral intake related to poor appetite as evidenced by per patient/family report; ongoing  GOAL:   Patient will meet greater than or equal to 90% of their needs; progressing  MONITOR:   PO intake, Supplement acceptance, Labs, Weight trends, Skin, I & O's  REASON FOR ASSESSMENT:   Consult Assessment of nutrition requirement/status  ASSESSMENT:   60 year old right-handed female with history of hypertension, hyperlipidemia initially presents after fall, generalized weakness and lightheadedness and dizziness. Rheumatoid arthritis positive. Therapy evaluations completed due to patient decreased functional mobility was admitted for a comprehensive rehab program.  Meal completion has been varied from 0-100%. Intake improved today, however per flow sheets po intake 25% all day yesterday. Pt with a decreased appetite. Pt currently has Ensure ordered TID and has been consuming 50% of them. Family at bedside has brought in food from outside to help encourage PO intake. RD to additionally order Prosource plus to aid in protein needs. Pt encouraged to eat her food at meals and to drink her supplements.   Labs and medications reviewed.   Diet Order:   Diet Order             Diet regular Room service appropriate? Yes; Fluid consistency: Thin  Diet effective now                   EDUCATION NEEDS:   Not appropriate for education at this time  Skin:  Skin Assessment: Reviewed RN Assessment Skin Integrity Issues:: Other (Comment) Other: non-pressure wound to buttocks  Last BM:  11/13  Height:   Ht Readings from Last 1 Encounters:  04/08/21 5\' 5"  (1.651 m)     Weight:   Wt Readings from Last 1 Encounters:  04/08/21 72.5 kg   BMI:  Body mass index is 26.6 kg/m.  Estimated Nutritional Needs:   Kcal:  1800-2000  Protein:  85-100 grams  Fluid:  >/= 1.8 L/day  13/01/22, MS, RD, LDN RD pager number/after hours weekend pager number on Amion.

## 2021-04-22 NOTE — Progress Notes (Signed)
PROGRESS NOTE   Subjective/Complaints:  Pt reports feeling a little better- thinks she needs to drink 4 cups/water/day- explained (she's drinking ~ 2 and supplements)- explained she needs to drink 6-8 cups/day.   Pt also says "she's not hungry"- has only drank 1/2 of supplement and no eating her breakfast.    Says LBM yesterday.     ROS:  Pt denies SOB, abd pain, CP, N/V/C/D, and vision changes    Objective:   No results found. Recent Labs    04/22/21 0511  WBC 5.4  HGB 11.3*  HCT 33.2*  PLT 227     Recent Labs    04/22/21 0511  NA 136  K 4.1  CL 104  CO2 23  GLUCOSE 89  BUN 15  CREATININE 0.94  CALCIUM 9.2      Intake/Output Summary (Last 24 hours) at 04/22/2021 1018 Last data filed at 04/21/2021 1855 Gross per 24 hour  Intake 600 ml  Output --  Net 600 ml        Physical Exam: Vital Signs Blood pressure 119/82, pulse (!) 102, temperature 98.7 F (37.1 C), temperature source Oral, resp. rate 17, height 5\' 5"  (1.651 m), weight 72.5 kg, SpO2 97 %.          General: awake, alert, appropriate, but no energy/flat; laying supine in bed- 0% of breakfast eaten and only 1/2 supplement; NAD HENT: conjugate gaze; oropharynx moist CV: regular rate; no JVD Pulmonary: CTA B/L; no W/R/R- good air movement GI: soft, NT, ND, (+)BS Psychiatric: appropriate- very flat' poor energy Neurological: STM issues  Musculoskeletal:     Cervical back: Normal range of motion. No rigidity.     Comments: 4+/5 in arms proximal and distally B/L HF 4-/5; KE 4/5 DF/PF 4+/5 B/L  No swollen joints seen on exam  Skin:    Comments: Intact to light touch on exam B/L  Light touch intact x all 4 extremities.    Assessment/Plan: 1. Functional deficits which require 3+ hours per day of interdisciplinary therapy in a comprehensive inpatient rehab setting. Physiatrist is providing close team supervision and 24 hour  management of active medical problems listed below. Physiatrist and rehab team continue to assess barriers to discharge/monitor patient progress toward functional and medical goals  Care Tool:  Bathing    Body parts bathed by patient: Right arm, Left arm, Chest, Abdomen, Face, Right upper leg, Left upper leg, Front perineal area   Body parts bathed by helper: Right lower leg, Left lower leg, Buttocks     Bathing assist Assist Level: Moderate Assistance - Patient 50 - 74%     Upper Body Dressing/Undressing Upper body dressing   What is the patient wearing?: Pull over shirt    Upper body assist Assist Level: Minimal Assistance - Patient > 75%    Lower Body Dressing/Undressing Lower body dressing      What is the patient wearing?: Pants, Underwear/pull up     Lower body assist Assist for lower body dressing: Maximal Assistance - Patient 25 - 49%     Toileting Toileting    Toileting assist Assist for toileting: Moderate Assistance - Patient 50 - 74%  Transfers Chair/bed transfer  Transfers assist     Chair/bed transfer assist level: Minimal Assistance - Patient > 75%     Locomotion Ambulation   Ambulation assist      Assist level: Minimal Assistance - Patient > 75% Assistive device: Walker-rolling Max distance: 92   Walk 10 feet activity   Assist     Assist level: Minimal Assistance - Patient > 75% Assistive device: Walker-rolling   Walk 50 feet activity   Assist Walk 50 feet with 2 turns activity did not occur: Safety/medical concerns  Assist level: Minimal Assistance - Patient > 75% Assistive device: Walker-rolling    Walk 150 feet activity   Assist Walk 150 feet activity did not occur: Safety/medical concerns         Walk 10 feet on uneven surface  activity   Assist Walk 10 feet on uneven surfaces activity did not occur: Safety/medical concerns         Wheelchair     Assist Is the patient using a wheelchair?:  Yes Type of Wheelchair: Manual    Wheelchair assist level: Total Assistance - Patient < 25% Max wheelchair distance: 150    Wheelchair 50 feet with 2 turns activity    Assist        Assist Level: Total Assistance - Patient < 25%   Wheelchair 150 feet activity     Assist      Assist Level: Total Assistance - Patient < 25%   Blood pressure 119/82, pulse (!) 102, temperature 98.7 F (37.1 C), temperature source Oral, resp. rate 17, height 5\' 5"  (1.651 m), weight 72.5 kg, SpO2 97 %.    Medical Problem List and Plan: 1.  Debility secondary to generalized pain/rheumatoid arthritis.  Follow-up outpatient rheumatology services Dr .  Prednisone taper as indicated             -patient may  shower             -ELOS/Goals: 5-7 days mod I  Continue CIR- PT, OT and SLP 2.  Antithrombotics: -DVT/anticoagulation:  Pharmaceutical: Lovenox             -antiplatelet therapy: n/a 3. Pain Management: Tramadol as needed  11/6- on stable prednisone dose- pain controlled- pt has moon facies as side effect, will reduce to 5mg  in am   11/14- pain controlled- con't regimen 4. Mood: Wellbutrin 300 mg daily, Lexapro 20 mg daily  11/12- added Abilify 5 mg QHS - con't regimen  11/14- seeing neuropsych today- to verify the dx. Might think about adding Ritalin to jump start thing.              -antipsychotic agents: N/A 5. Neuropsych: This patient is? capable of making decisions on her own behalf. 6. Skin/Wound Care: Routine skin checks 7. Fluids/Electrolytes/Nutrition: Routine in and outs with follow-up chemistries 8.  Hypertension.  Norvasc 5 mg daily, lisinopril 30 mg daily.  Monitor with increased mobility   Vitals:   04/21/21 2005 04/22/21 0651  BP: 117/76 119/82  Pulse: (!) 108 (!) 102  Resp: 20 17  Temp: 98.7 F (37.1 C) 98.7 F (37.1 C)  SpO2: 97% 97%    11/7- will stop Norvasc 5 mg daily as of Tuesday and recheck labs in Am to see if needs IVFs  11/8- will decrease  Lisinopril from 20 mg daily to 5 mg daily and start ACE wraps and TEDs to knee B/L- also, push fluids- explained to pt needs 6-8 cups/water/day.   11/9- BP  still low- but better in documentation, will hold Lisinopril completely tomorrow-   11/12- BP better off BP meds- still soft- likely due to poor appetite.   11/15- pulse is a little high- since off BP meds- will monitor 9.  Elevated LFTs/transaminitis- as well as CK.  Improved.  Follow-up chemistries  11/2- coming down- is 60/50 now- 10/24 was 150/135- so improving- con't to monitor  11/8- down to 50/41- con't monitoring 10.  Hyperlipidemia.  Lipitor held due to elevated LFTs 11. Poor energy  11/3- will check TSH and Vit D levels in AM- could be causing 11/6 vit D level mildly reduced will supplement 1000IU daily  12. Hx of depression  11/3- on wellbutrin and Lexapro- high doses- will con't  11/10- sister thinks she's bipolar- Will start Abilify 5 mg QHS as adjuvant- think could help pull her out of depressed mood and poor initiation and now cognitive issues?.   11/11- MRI (-) for stroke, etc- so will continue down the road that this is likely severe depression per d/w sister who agrees.   11/14- seeing neuropsych today- will think about Ritalin for initiation.   11/15- will start Ritalin 52m BID to jump start meds.  13. Hypotension  11/8- dramatically decrease lisinopril and add ACE wraps/TEDs and have pt drink more- per CMP, not dry, so wait on IVFs.  14. Moderate to severe malnutrition  11/9- have ordered dietician consult Since pt's albumin is dropping.   11/14- pt still not eating- will verify has supplements 3x/day.   11/15- will check labs Thursday- CMP    LOS: 14 days A FACE TO FACE EVALUATION WAS PERFORMED  Saban Heinlen 04/22/2021, 10:18 AM

## 2021-04-22 NOTE — Progress Notes (Signed)
Speech Language Pathology Daily Session Note  Patient Details  Name: Carrie [name] MRN: [id number] Date of Birth: April 07, 1961  Today's Date: 04/22/2021 SLP Individual Time: 7989-2119 SLP Individual Time Calculation (min): 39 min  Short Term Goals: Week 1: SLP Short Term Goal 1 (Week 1): Patient will perform simulated medication organization/pill box task with minA cues for accuracy. SLP Short Term Goal 2 (Week 1): Patient will perform alternating attention tasks in quite or mildly distracting environments with minA verbal redirection cues. SLP Short Term Goal 3 (Week 1): Patient will demonstrate adequate recall of daily events (therapeutic, nursing, etc) with minA cues for compensating (memory notebook, etc). SLP Short Term Goal 4 (Week 1): Patient will SLP Short Term Goal 5 (Week 1): Patient will demonstrate adequate awareness to errors and attempt to self-correct during functional tasks, with minA verbal, visual cues.  Skilled Therapeutic Interventions: Pt seen for skilled ST with focus on cognitive goals. Pt continues with very flat affect and poor topic maintenance. In discussing with PT/OT, pt appears to be a poor historian and is providing differing baseline information. Pt noted with AM meal present untouched, SLP setting up meal and pt self feeding cereal and applesauce with min A cues for attention and simple problem solving. Pt benefiting from mod A verbal cues for simple recall and orientation at this time, cognitive goals to be downgraded to Supervision due to slow progress and recommend 24/7 supervision at discharge (unable to d/c to SNF). Pt left with NT for transfer to bathroom, cont ST POC.    Pain Pain Assessment Pain Scale: 0-10 Pain Score: 0-No pain  Therapy/Group: Individual Therapy  Tacey Ruiz 04/22/2021, 10:28 AM

## 2021-04-22 NOTE — Progress Notes (Signed)
Occupational Therapy Session Note  Patient Details  Name: Carrie [name] MRN: [id number] Date of Birth: 12/31/1960  Today's Date: 04/22/2021 OT Individual Time: 3335-4562 and 1515-1610 OT Individual Time Calculation (min): 24 min and 55 min Missed 20 mins 2/2 fatigue   Short Term Goals: Week 1:  OT Short Term Goal 1 (Week 1): Pt will indepedently verbalize at least 2 energy conservation strategies to implement into ADL. OT Short Term Goal 1 - Progress (Week 1): Progressing toward goal OT Short Term Goal 2 (Week 1): Pt will don pants with CGA and AE PRN. OT Short Term Goal 2 - Progress (Week 1): Progressing toward goal OT Short Term Goal 3 (Week 1): Pt will complete shower transfer with CGA and LRAD. OT Short Term Goal 3 - Progress (Week 1): Progressing toward goal Week 2:  OT Short Term Goal 1 (Week 2): Pt will perform shower level bathing with no more than Min A and AE PRN OT Short Term Goal 2 (Week 2): Pt will ambulate to/from bathroom with LRAD Supervision OT Short Term Goal 3 (Week 2): Pt will utilize energy conservation techniques during ADL with no more than Min verbal cues   Skilled Therapeutic Interventions/Progress Updates:    Pt greeted at time of session semireclined in bed no pain at rest but did have pain in L hand with movement, overall achy feeling. Did not rate pain and rest breaks PRN. Focus of session initially on donning TEDS and ACE wraps bed level prior to mobility and starting OOB activity. Supine > sit CGA with very extended time and verbal cues for sequencing, heavy use of bed features. Sit <> stands x2 with Mod A first attempt fading to Min A and side stepping toward HOB. Back in bed alarm on call bell in reach.   Session 2: Pt greeted at time of session semireclined in bed resting, no pain at rest but very fatigued. Pt adamantly declining OOB and out of room activity, also declined ADL and toileting. Retrieved 2# dowel and pt transitioned supine > sit CGA. Seated  EOB performed the following 2x10-12 with 2# dowel: bicep curl, chest press, FWD circles. Pt unable to achieve overhead ROM 2/2 shoulder discomfort. Sit <> stands with cues for hand and foot placement for 3 trials, all with Mod A and extensive cuing. Education throughout session for DC planning and need to put forth max effort in sessions for max benefit. Pt very fatigued at this time and declined further OT. Missed 20 mins of OT 2/2 fatigue.    Therapy Documentation Precautions:  Precautions Precautions: Fall Precaution Comments: fatigues quickly Restrictions Weight Bearing Restrictions: No    Therapy/Group: Individual Therapy  Erasmo Score 04/22/2021, 7:21 AM

## 2021-04-22 NOTE — Plan of Care (Signed)
  Problem: RH Problem Solving Goal: LTG Patient will demonstrate problem solving for (SLP) Description: LTG:  Patient will demonstrate problem solving for basic/complex daily situations with cues  (SLP) 04/22/2021 1223 by Gerda Diss R, CCC-SLP Note: Goal downgraded d/t slow progress 04/22/2021 1222 by Tacey Ruiz, CCC-SLP Flowsheets (Taken 04/22/2021 1222) LTG: Patient will demonstrate problem solving for (SLP): Basic daily situations LTG Patient will demonstrate problem solving for: Supervision   Problem: RH Memory Goal: LTG Patient will demonstrate ability for day to day (SLP) Description: LTG:   Patient will demonstrate ability for day to day recall/carryover during cognitive/linguistic activities with assist  (SLP) 04/22/2021 1223 by Gerda Diss R, CCC-SLP Note: Goal downgraded due to slow progress 04/22/2021 1222 by Tacey Ruiz, CCC-SLP Flowsheets (Taken 04/22/2021 1222) LTG: Patient will demonstrate ability for day to day recall: New information LTG: Patient will demonstrate ability for day to day recall/carryover during cognitive/linguistic activities with assist (SLP): Supervision Goal: LTG Patient will use memory compensatory aids to (SLP) Description: LTG:  Patient will use memory compensatory aids to recall biographical/new, daily complex information with cues (SLP) 04/22/2021 1223 by Tacey Ruiz, CCC-SLP Note: Goal downgraded due to slow progress 04/22/2021 1222 by Tacey Ruiz, CCC-SLP Flowsheets (Taken 04/22/2021 1222) LTG: Patient will use memory compensatory aids to (SLP): Supervision   Problem: RH Awareness Goal: LTG: Patient will demonstrate awareness during functional activites type of (SLP) Description: LTG: Patient will demonstrate awareness during functional activites type of (SLP) 04/22/2021 1223 by Tacey Ruiz, CCC-SLP Note: Goal downgraded due to slow progress 04/22/2021 1222 by Tacey Ruiz, CCC-SLP Flowsheets (Taken  04/22/2021 1222) LTG: Patient will demonstrate awareness during cognitive/linguistic activities with assistance of (SLP): Supervision

## 2021-04-22 NOTE — Consult Note (Signed)
Neuropsychological Consultation   Patient:   Carrie [name]   DOB:   Nov 25, 1960  MR Number:  [id number]  Location:  [facility] [id number] [place] [address] Dept: [place]: [phone]           Date of Service:   04/21/2021  Start Time:   10 AM End Time:   11 AM  Provider/Observer:  Ilean Skill, Psy.D.       Clinical Neuropsychologist       Billing Code/Service: [phone]  Chief Complaint:    Carrie [name] is a 60 year old female with a history of hypertension, hyperlipidemia and depression.  The patient quit smoking 7 years ago.  Patient presented to [facility] long hospital on 03/26/2021 with recent fall 3 weeks prior after missing a step.  The patient denies any loss of consciousness.  The patient reports that she was walking up the steps with a basket of close and it got into the top steps and lost her balance and fell backwards down the steps.  The patient reports that she had been experiencing increasing weakness, lightheadedness and dizziness prior to this fall.  Cranial CT scan was negative for acute process.  CT cervical scan was also unremarkable.  Work-up was conducted for possible rheumatoid arthritis and was positive ANA negative CCP mildly elevated ESR 26 and CRP 2.5 as well as elevated RA (101.4).  Patient was recommended for prednisone and taper.  Patient was referred for CIR due to decreased functional mobility.  Reason for Service:  Patient was referred for neuropsychological consultation due to concerns around the development of severe depression in the setting of poor short-term memory.  Below is the HPI for the current admission.  HPI: Carrie [name] is a 60 year old right-handed female with history of hypertension, hyperlipidemia and depression, quit smoking 7 years ago.  Per chart review patient lives alone.  Two-level home 10 steps to entry.  Bed and bath on second  level.  Independent prior to admission.  Presented to [facility] long hospital 03/26/2021 with recent fall 3 weeks ago after missing a step without loss of consciousness.  She also reported some generalized weakness and lightheadedness and dizziness on standing.  By report she had recently gone to the [place] urgent care about a week ago received Flexeril and ibuprofen.  Admission chemistries unremarkable except creatinine 1.03, AST 161, ALT 114, troponin negative, CK 638, lactic acid 1.8, TSH within normal limits, vitamin B12 7158, urinalysis negative nitrite, urine drug screen negative.  Cranial CT scan negative.  CT cervical spine unremarkable.  X-rays of hip and pelvis no acute osseous injury.  Echocardiogram with ejection fraction of 60 to 65% no wall motion abnormalities grade 1 diastolic dysfunction.  Ultrasound of the abdomen gallbladder distended by sludge no gross hepatic mass.  Work-up for possible rheumatoid arthritis positive ANA negative CCP mildly elevated ESR 26 and CRP 2.5 as well as elevated RA(101.4).  Outpatient rheumatology Dr.Aryal curbside consulted recommend treatment with prednisone and taper.  CK improved to 226 as well as improved LFTs..  Subcutaneous Lovenox for DVT prophylaxis.  Tolerating a regular diet.  Therapy evaluations completed due to patient decreased functional mobility was admitted for a comprehensive rehab program.  Current Status:  Patient had an MRI conducted on 04/17/2021 during her inpatient rehab stay.  No particular acute intracranial abnormalities were noted and only mild parenchymal volume loss noted.  There was no acute process  noted.  The patient was laying supine in her bed with her head slightly elevated as I entered the room.  She was awake but rather groggy with flat affect and slowed verbal responses.  The patient was able to describe her fall and events around her fall with apparent accuracy and continue denies any loss of consciousness in this fall.   She reports that she had been feeling increased fatigue and difficulties prior to this fall.  The patient denied being "depressed" but she did acknowledge anhedonia, loss of motivation and drive, and apathy.  The patient was not particularly psychologically savvy.  Patient admitted to negative mood states and these persisted for some time.  The patient has been prescribed Wellbutrin and Lexapro prior to this fall and acknowledges that her negative mood state was addressed with her PCP.  The patient continues to take Wellbutrin and Lexapro on the unit.  The patient was rather lethargic with flat affect and her description of her behavioral patterns are consistent with depression but the diagnostic complication is what was going on with her physically up to this point.   Behavioral Observation: Carrie [name]  presents as a 60 y.o.-year-old Right African American Female who appeared her stated age. her dress was Appropriate and she was Well Groomed and her manners were Appropriate to the situation.  her participation was indicative of Drowsy and Redirectable behaviors.  There were physical disabilities noted.  she displayed an appropriate level of cooperation and motivation.     Interactions:    Active Redirectable  Attention:   abnormal and attention span appeared shorter than expected for age  Memory:   abnormal; remote memory intact, recent memory impaired  Visuo-spatial:  not examined  Speech (Volume):  low  Speech:   normal; normal  Thought Process:  Coherent and Relevant  Though Content:  WNL; not suicidal and not homicidal  Orientation:   person, place, and time/date  Judgment:   Fair  Planning:   Poor  Affect:    Depressed, Flat, and Lethargic  Mood:    Dysphoric  Insight:   Fair  Intelligence:   low  Medical History:   Past Medical History:  Diagnosis Date   Hyperlipidemia    Hypertension          Patient Active Problem List   Diagnosis Date Noted   Confusion     Debility 04/08/2021   Rheumatoid arthritis flare ([place]) 04/08/2021   Fall (on) (from) other stairs and steps, subsequent encounter 04/08/2021   Non-traumatic rhabdomyolysis    Pre-syncope 03/26/2021   Generalized weakness 03/26/2021   Joint pain 03/26/2021   Bronchitis 01/17/2021   Depression, major, single episode, moderate ([place]) 08/25/2018   Depression, major, single episode, severe ([place]) 04/26/2017   Bunion 04/22/2017   Pruritus 08/04/2016   Environmental and seasonal allergies 08/04/2016   BACK PAIN 04/28/2010   OTHER ACUTE REACTIONS TO STRESS 03/26/2010   HOT FLASHES 01/17/2010   FATIGUE 02/02/2008   Hyperlipidemia LDL goal <100 08/18/2007   Essential hypertension 02/25/2007   Chest pain, unspecified 02/25/2007   REDUCTION MAMMOPLASTY, HX OF 02/25/2007              Abuse/Trauma History: The patient does not describe any history of abuse or traumatic history.  Psychiatric History:  The patient clearly has a prior psychiatric history of depression with the first noted diagnosis in her EMR from 2018.  It is described as a severe depressive event.  It is curious that the patient  generally denied being depressed or having a history of significant depression.  Family Med/Psych History:  Family History  Problem Relation Age of Onset   Breast cancer Mother        deceased by 23   Breast cancer Sister 61   Diabetes Sister    Breast cancer Other    Coronary artery disease Other    Hypertension Other     Risk of Suicide/Violence: low patient denies any suicidal or homicidal ideation.  Impression/DX:  TAKASHA VETERE is a 60 year old female with a history of hypertension, hyperlipidemia and depression.  The patient quit smoking 7 years ago.  Patient presented to [facility] long hospital on 03/26/2021 with recent fall 3 weeks prior after missing a step.  The patient denies any loss of consciousness.  The patient reports that she was walking up the steps with a basket of close and it got  into the top steps and lost her balance and fell backwards down the steps.  The patient reports that she had been experiencing increasing weakness, lightheadedness and dizziness prior to this fall.  Cranial CT scan was negative for acute process.  CT cervical scan was also unremarkable.  Work-up was conducted for possible rheumatoid arthritis and was positive ANA negative CCP mildly elevated ESR 26 and CRP 2.5 as well as elevated RA (101.4).  Patient was recommended for prednisone and taper.  Patient was referred for CIR due to decreased functional mobility.  Patient had an MRI conducted on 04/17/2021 during her inpatient rehab stay.  No particular acute intracranial abnormalities were noted and only mild parenchymal volume loss noted.  There was no acute process noted.  The patient was laying supine in her bed with her head slightly elevated as I entered the room.  She was awake but rather groggy with flat affect and slowed verbal responses.  The patient was able to describe her fall and events around her fall with apparent accuracy and continue denies any loss of consciousness in this fall.  She reports that she had been feeling increased fatigue and difficulties prior to this fall.  The patient denied being "depressed" but she did acknowledge anhedonia, loss of motivation and drive, and apathy.  The patient was not particularly psychologically savvy.  Patient admitted to negative mood states and these persisted for some time.  The patient has been prescribed Wellbutrin and Lexapro prior to this fall and acknowledges that her negative mood state was addressed with her PCP.  The patient continues to take Wellbutrin and Lexapro on the unit.  The patient was rather lethargic with flat affect and her description of her behavioral patterns are consistent with depression but the diagnostic complication is what was going on with her physically up to this point.  Disposition/Plan:  I do think that the patient is  continuing to suffer from severe recurrent major depressive disorder that has been acutely complicated by her debility and physical status.  The patient is particularly focused on her physical status primarily and likely has been dealing with a significant depressive status for some time and is essentially normalized this feeling state.  Today we worked on coping and adjustment issues and stressed the importance for her to continue to be physically active and continue to work on her therapeutic goals and physical strength even after she is discharged from the unit.  We talked about engaging in talking with family members to keep pushing her to maintain regular daily physical movement and activity.  Diagnosis:  Confusion - Plan: DG CHEST PORT 1 VIEW, DG CHEST PORT 1 VIEW         Electronically Signed   _______________________ Ilean Skill, Psy.D. Clinical Neuropsychologist

## 2021-04-23 DIAGNOSIS — R5381 Other malaise: Secondary | ICD-10-CM | POA: Diagnosis not present

## 2021-04-23 NOTE — Progress Notes (Signed)
Speech Language Pathology Weekly Progress and Session Note  Patient Details  Name: NYKOLE MATOS MRN: [id number] Date of Birth: 1960-11-14  Beginning of progress report period: April 16, 2021 End of progress report period: April 23, 2021  Today's Date: 04/23/2021 SLP Individual Time: 1300-1350 SLP Individual Time Calculation (min): 50 min  Short Term Goals: Week 1: SLP Short Term Goal 1 (Week 1): Patient will perform simulated medication organization/pill box task with minA cues for accuracy. SLP Short Term Goal 1 - Progress (Week 1): Not met SLP Short Term Goal 2 (Week 1): Patient will perform alternating attention tasks in quite or mildly distracting environments with minA verbal redirection cues. SLP Short Term Goal 2 - Progress (Week 1): Not met SLP Short Term Goal 3 (Week 1): Patient will demonstrate adequate recall of daily events (therapeutic, nursing, etc) with minA cues for compensating (memory notebook, etc). SLP Short Term Goal 3 - Progress (Week 1): Progressing toward goal SLP Short Term Goal 4 (Week 1): Patient will SLP Short Term Goal 5 (Week 1): Patient will demonstrate adequate awareness to errors and attempt to self-correct during functional tasks, with minA verbal, visual cues. SLP Short Term Goal 5 - Progress (Week 1): Not met    New Short Term Goals: Week 2: SLP Short Term Goal 1 (Week 2): STG = LTG d/t ELOS  Weekly Progress Updates: Pt has unfortunately met 0 out of 4 short term goals this reporting period due to general functional and cognitive decline as well as poor insight and awareness into cognitive impairments at this time. Pt requiring mod-max A for money management and other mildly complex tasks. Pt is unable to perform alternating attention tasks at this time, sustained attention <30 seconds. Pt orientation to time has declined and pt requires max A locate and utilize external aids for assistance. SLP has spoken with both sisters during sessions on pts  current function and goals of tx, verbalized understanding. At this time, goals have been downgraded to Supervision level, however if pt continues to decline goals will need to be downgraded further. Recommend 24/7 supervision at discharge due to cognitive impairments.   Intensity: Minumum of 1-2 x/day, 30 to 90 minutes Frequency: 3 to 5 out of 7 days Duration/Length of Stay: 11/23 Treatment/Interventions: Cognitive remediation/compensation;Internal/external aids;Medication managment;Environmental controls;Functional tasks;Patient/family education   Daily Session  Skilled Therapeutic Interventions: Pt seen for skilled ST with focus on cognitive communication goals, sister present toward end of session. Pt insistent she had seen this therapist already today, educated patient that we hadn't seen each other since yesterday morning. SLP attempting "Counting Money" portion of ALFA, requiring significant extra time (completed 4 problems in 45 minutes), MAX A verbal and visual cues to increase simple following directions and focused attention. With MAX cues, pt only ~25% accuracy with task. Pt would often forget task direction/prompt following 20 second delay, sustained attention to task ~30 seconds. Pt requiring MAX verbal and visual cues to attempt to increase orientation to time concepts, pt having difficulty with comprehension of questions (would answer with incorrect and numerous DOW when asked MOY, asked "which months do you want" when asked MOY). Pt requiring max cues to scan environment to look at calendar, phone and memory notebook to increase orientation concepts which was only mildly effective. Pt left in room with sister present for all needs. Cont ST POC.    General    Pain Pain Assessment Pain Scale: 0-10 Pain Score: 0-No pain  Therapy/Group: Individual Therapy  Dewaine Conger  04/23/2021, 12:46 PM

## 2021-04-23 NOTE — Progress Notes (Signed)
PROGRESS NOTE   Subjective/Complaints:  Pt reports got her meds already this AM- has a headache.   Per PT, did pretty good with therapy yesterday- -walked 7ft x2 with RW min A.  Did with sister.   ROS:  Pt denies SOB, abd pain, CP, N/V/C/D, and vision changes   Objective:   No results found. Recent Labs    04/22/21 0511  WBC 5.4  HGB 11.3*  HCT 33.2*  PLT 227     Recent Labs    04/22/21 0511  NA 136  K 4.1  CL 104  CO2 23  GLUCOSE 89  BUN 15  CREATININE 0.94  CALCIUM 9.2      Intake/Output Summary (Last 24 hours) at 04/23/2021 0836 Last data filed at 04/22/2021 1320 Gross per 24 hour  Intake 480 ml  Output --  Net 480 ml        Physical Exam: Vital Signs Blood pressure 121/89, pulse (!) 102, temperature 97.6 F (36.4 C), temperature source Oral, resp. rate 18, height 5\' 5"  (1.651 m), weight 71.1 kg, SpO2 95 %.           General: awake, alert, appropriate, flat; very flat; PT in room; sitting up in bed;  NAD HENT: conjugate gaze; oropharynx moist CV: regular rate; no JVD Pulmonary: CTA B/L; no W/R/R- good air movement GI: soft, NT, ND, (+)BS Psychiatric: extremely flat; no energy- no spontaneous speech Neurological: STM issues still notable- c/o HA Musculoskeletal:     Cervical back: Normal range of motion. No rigidity.     Comments: 4+/5 in arms proximal and distally B/L HF 4-/5; KE 4/5 DF/PF 4+/5 B/L  No swollen joints seen on exam  Skin:    Comments: Intact to light touch on exam B/L  Light touch intact x all 4 extremities.    Assessment/Plan: 1. Functional deficits which require 3+ hours per day of interdisciplinary therapy in a comprehensive inpatient rehab setting. Physiatrist is providing close team supervision and 24 hour management of active medical problems listed below. Physiatrist and rehab team continue to assess barriers to discharge/monitor patient progress  toward functional and medical goals  Care Tool:  Bathing    Body parts bathed by patient: Right arm, Left arm, Chest, Abdomen, Face, Right upper leg, Left upper leg, Front perineal area   Body parts bathed by helper: Right lower leg, Left lower leg, Buttocks     Bathing assist Assist Level: Moderate Assistance - Patient 50 - 74%     Upper Body Dressing/Undressing Upper body dressing   What is the patient wearing?: Pull over shirt    Upper body assist Assist Level: Minimal Assistance - Patient > 75%    Lower Body Dressing/Undressing Lower body dressing      What is the patient wearing?: Pants, Underwear/pull up     Lower body assist Assist for lower body dressing: Maximal Assistance - Patient 25 - 49%     Toileting Toileting    Toileting assist Assist for toileting: Moderate Assistance - Patient 50 - 74%     Transfers Chair/bed transfer  Transfers assist     Chair/bed transfer assist level: Minimal Assistance - Patient > 75%  Locomotion Ambulation   Ambulation assist      Assist level: Minimal Assistance - Patient > 75% Assistive device: Walker-rolling Max distance: 92   Walk 10 feet activity   Assist     Assist level: Minimal Assistance - Patient > 75% Assistive device: Walker-rolling   Walk 50 feet activity   Assist Walk 50 feet with 2 turns activity did not occur: Safety/medical concerns  Assist level: Minimal Assistance - Patient > 75% Assistive device: Walker-rolling    Walk 150 feet activity   Assist Walk 150 feet activity did not occur: Safety/medical concerns         Walk 10 feet on uneven surface  activity   Assist Walk 10 feet on uneven surfaces activity did not occur: Safety/medical concerns         Wheelchair     Assist Is the patient using a wheelchair?: Yes Type of Wheelchair: Manual    Wheelchair assist level: Total Assistance - Patient < 25% Max wheelchair distance: 150    Wheelchair 50 feet  with 2 turns activity    Assist        Assist Level: Total Assistance - Patient < 25%   Wheelchair 150 feet activity     Assist      Assist Level: Total Assistance - Patient < 25%   Blood pressure 121/89, pulse (!) 102, temperature 97.6 F (36.4 C), temperature source Oral, resp. rate 18, height 5\' 5"  (1.651 m), weight 71.1 kg, SpO2 95 %.    Medical Problem List and Plan: 1.  Debility secondary to generalized pain/rheumatoid arthritis.  Follow-up outpatient rheumatology services Dr .  Prednisone taper as indicated             -patient may  shower             -ELOS/Goals: 5-7 days mod I  Pt walked 70 ft x2 with RW/min A-  Continue CIR- PT, OT and SLP 2.  Antithrombotics: -DVT/anticoagulation:  Pharmaceutical: Lovenox             -antiplatelet therapy: n/a 3. Pain Management: Tramadol as needed  11/6- on stable prednisone dose- pain controlled- pt has moon facies as side effect, will reduce to 5mg  in am   11/14- pain controlled- con't regimen 4. Mood: Wellbutrin 300 mg daily, Lexapro 20 mg daily  11/12- added Abilify 5 mg QHS - con't regimen  11/14- seeing neuropsych today- to verify the dx. Might think about adding Ritalin to jump start thing.   11/16- added Ritalin- can't tell if was helpful as of yet- spoke to PT_ she will let me know             -antipsychotic agents: N/A 5. Neuropsych: This patient is? capable of making decisions on her own behalf. 6. Skin/Wound Care: Routine skin checks 7. Fluids/Electrolytes/Nutrition: Routine in and outs with follow-up chemistries 8.  Hypertension.  Norvasc 5 mg daily, lisinopril 30 mg daily.  Monitor with increased mobility   Vitals:   04/22/21 2002 04/23/21 0611  BP: 118/82 121/89  Pulse: (!) 107 (!) 102  Resp: 16 18  Temp: 98.4 F (36.9 C) 97.6 F (36.4 C)  SpO2: 96% 95%    11/7- will stop Norvasc 5 mg daily as of Tuesday and recheck labs in Am to see if needs IVFs  11/8- will decrease Lisinopril from 20 mg  daily to 5 mg daily and start ACE wraps and TEDs to knee B/L- also, push fluids- explained to pt needs 6-8  cups/water/day.   11/15- pulse is a little high- since off BP meds- will monitor  11/16- still of BP meds- HR low 100s- con't to monitor- BP won't tolerate meds for tachycardia 9.  Elevated LFTs/transaminitis- as well as CK.  Improved.  Follow-up chemistries  11/2- coming down- is 60/50 now- 10/24 was 150/135- so improving- con't to monitor  11/8- down to 50/41- con't monitoring 10.  Hyperlipidemia.  Lipitor held due to elevated LFTs 11. Poor energy  11/3- will check TSH and Vit D levels in AM- could be causing 11/6 vit D level mildly reduced will supplement 1000IU daily  12. Hx of depression  11/3- on wellbutrin and Lexapro- high doses- will con't  11/10- sister thinks she's bipolar- Will start Abilify 5 mg QHS as adjuvant- think could help pull her out of depressed mood and poor initiation and now cognitive issues?.   11/11- MRI (-) for stroke, etc- so will continue down the road that this is likely severe depression per d/w sister who agrees.   11/14- seeing neuropsych today- will think about Ritalin for initiation.   11/15- will start Ritalin 48m BID to jump start meds.   11/16- can't tell if helpful quite yet- PT and OT will let me know if makes a difference 13. Hypotension  11/8- dramatically decrease lisinopril and add ACE wraps/TEDs and have pt drink more- per CMP, not dry, so wait on IVFs.  14. Moderate to severe malnutrition  11/9- have ordered dietician consult Since pt's albumin is dropping.   11/14- pt still not eating- will verify has supplements 3x/day.   11/15- will check labs Thursday- CMP    LOS: 15 days A FACE TO FACE EVALUATION WAS PERFORMED  Jaxston Chohan 04/23/2021, 8:36 AM

## 2021-04-23 NOTE — Progress Notes (Signed)
Occupational Therapy Weekly Progress Note  Patient Details  Name: Carrie [name] MRN: [id number] Date of Birth: Jan 05, 1961  Beginning of progress report period: April 17, 2021 End of progress report period: April 23, 2021    Patient has met 0 of 3 short term goals.  Pt has been significantly limited during this reporting period by fatigue, weakness, and decreased endurance. Pt's skill performance also significantly varies. Pt currently requires Min/Mod for LB ADLs, Min A for stand pivot transfers, Min A for short distance mobility, Min A for toileting. Plan to continue to address deficits and have family training in upcoming weeks as pt will need to go home with family support.   Patient continues to demonstrate the following deficits: muscle weakness, decreased cardiorespiratoy endurance, impaired timing and sequencing, unbalanced muscle activation, decreased coordination, and decreased motor planning, decreased motor planning, decreased initiation, decreased attention, decreased awareness, decreased problem solving, decreased safety awareness, decreased memory, and delayed processing, and decreased sitting balance, decreased standing balance, decreased postural control, and decreased balance strategies and therefore will continue to benefit from skilled OT intervention to enhance overall performance with BADL, iADL, and Reduce care partner burden.  Patient not progressing toward long term goals.  See goal revision..  Plan of care revisions: include downgrading transfer goals to CGA 2/2 fatigue and weakness.  OT Short Term Goals Week 2:  OT Short Term Goal 1 (Week 2): Pt will perform shower level bathing with no more than Min A and AE PRN OT Short Term Goal 1 - Progress (Week 2): Not met OT Short Term Goal 2 (Week 2): Pt will ambulate to/from bathroom with LRAD Supervision OT Short Term Goal 2 - Progress (Week 2): Not met OT Short Term Goal 3 (Week 2): Pt will utilize energy conservation  techniques during ADL with no more than Min verbal cues OT Short Term Goal 3 - Progress (Week 2): Not met Week 3:  OT Short Term Goal 1 (Week 3): STGs = LTGs 2/2 ELOS    Therapy Documentation Precautions:  Precautions Precautions: Fall Precaution Comments: fatigues quickly Restrictions Weight Bearing Restrictions: No     Therapy/Group: Individual Therapy  Viona Gilmore 04/23/2021, 12:54 PM

## 2021-04-23 NOTE — Progress Notes (Signed)
Occupational Therapy Session Note  Patient Details  Name: Carrie [name] MRN: [id number] Date of Birth: 08/14/1960  Today's Date: 04/23/2021 OT Individual Time: 2500-3704 OT Individual Time Calculation (min): 69 min    Short Term Goals: Week 1:  OT Short Term Goal 1 (Week 1): Pt will indepedently verbalize at least 2 energy conservation strategies to implement into ADL. OT Short Term Goal 1 - Progress (Week 1): Progressing toward goal OT Short Term Goal 2 (Week 1): Pt will don pants with CGA and AE PRN. OT Short Term Goal 2 - Progress (Week 1): Progressing toward goal OT Short Term Goal 3 (Week 1): Pt will complete shower transfer with CGA and LRAD. OT Short Term Goal 3 - Progress (Week 1): Progressing toward goal Week 2:  OT Short Term Goal 1 (Week 2): Pt will perform shower level bathing with no more than Min A and AE PRN OT Short Term Goal 2 (Week 2): Pt will ambulate to/from bathroom with LRAD Supervision OT Short Term Goal 3 (Week 2): Pt will utilize energy conservation techniques during ADL with no more than Min verbal cues   Skilled Therapeutic Interventions/Progress Updates:    Pt greeted at time of session on commode with NT, hand off to OT to resume care. No pain reported. Extended time on commode and able to perform hygiene with leans seated with Supervision. Sit > stand Min/Mod from commode and static stand to don underwear and pants over hips with assist to fully get over hips, unable to fully pull up over bottom. Stand pivot no AD with Min A and using wheelchair armrest. Wheelchair transport to sink level for UB bathing seated. Note pt needing extended time for all tasks 2/2 fatigue and weakness, needing assist to fully doff and don shirt, needed cues for sequencing and problem solving through entire ADL. Pt needing frequent rest breaks as well. O2 sats at 95% after activity and HR 112 seated. Wheelchair transport to gym and focused on [facility] and cognition for pattern recognition and  following commands for 2 rounds of in hand translation and pattern making for peg board, needing Mod cues for accuracy. Note pt needed rest breaks throughout all activity, extended time 2/2 slow movements. Back in room up in chair alarm on call bell in reach.    Therapy Documentation Precautions:  Precautions Precautions: Fall Precaution Comments: fatigues quickly Restrictions Weight Bearing Restrictions: No    Therapy/Group: Individual Therapy  Erasmo Score 04/23/2021, 7:18 AM

## 2021-04-23 NOTE — Progress Notes (Signed)
Physical Therapy Session Note  Patient Details  Name: Carrie [name] MRN: [id number] Date of Birth: 25-Sep-1960  Today's Date: 04/23/2021 PT Individual Time: 0800-0900,1430-1504 PT Individual Time Calculation (min): 60 min,44 min  Short Term Goals: Week 1:  PT Short Term Goal 1 (Week 1): Patient will complete bed mobility with CGA consistently PT Short Term Goal 2 (Week 1): Patient will transfer bed <> wc with LRAD and CGA consistently PT Short Term Goal 2 - Progress (Week 1): Not met PT Short Term Goal 3 (Week 1): Patient will ambulate >54ft with LRAD and CGA PT Short Term Goal 3 - Progress (Week 1): Not met Week 2:  PT Short Term Goal 1 (Week 2): Pt will ambulate >25 ft conistently PT Short Term Goal 2 (Week 2): Pt will propell w/c >50 ft PT Short Term Goal 3 (Week 2): Pt will initiate stair training  Skilled Therapeutic Interventions/Progress Updates:    Session 1: pt received in bed and agreeable to therapy. Pt reports headache, pt encouraged to drink water to combat likely dehydration and no further intervention was required. Noted that ted hose and ace wraps had been left on all night. Stand pivot transfer to w/c with mod A to stand and balance. Pt continues to demo retropulsion in standing, some improvement by end of session with cueing for anterior weight shift and to catch her balance before attempting to move. Pt propelled w/c with BUE x 50 ft  in ~10 minutes with max VC, demonstrative cues fading to question cues. Pt has difficulty with recalling location of push rim and technique for turning and efficient propulsion. Pt does not currently demonstrate functional w/c propulsion d/t slow speed and poor cognition. Pt then navigated 3" step in //bars 3 x 2. Pt required max questioning and demonstrative cues for hand placement to stand in bars, frequently placing hand on brake or push rim instead of arm rest to push up. Mod A to navigate step for balance and technique cues. On one occasion  pt stated, "I need to sit down," and attempted to sit while still a full quarter turn from her w/c. Despite VC and tactile cues pt sat before completely turned. Pt education on safety and that she would have fallen without assist/parallel bars, pt refused education, stating that she needed to sit urgently regardless of safety. Gait x 75 ft with min A and RW, VC for incr step length. Pt set up with breakfast at close of session and was left with all needs in reach and alarm active.   Session 2: Pt received in bed with her sister present. Pt initially disagreeable to PT, but requested to use the bathroom. Min A to facilitate trunk motion to sit EOB with bed rails and HOB elevated. Sit to stand with heavy mod A to facilitate anterior weight shift and VC for upright posture. Pt demonstrated significant retropulsion that did not improve with activity. ambulatory transfer to commode with RW and max A for balance. Multimodal cues to step forward into walker for improved upright posture, with min improvement and no carry over. Mod A for 3/3 toileting tasks. Therapist noted ~1cm wound on buttock, nursing made aware. After standing for hygiene, pt stated she needed to sit quickly and started to walk out of bathroom before clothing was pulled back up. When instructed to sit on closed commode lid, pt asked why, despite demoing unsafe behavior while looking for somewhere to sit. Pt required max demonstrative cues to push up from arm of  commode, with no carryover from first stand to second several minutes later. After taking several steps to w/c pt again demoed unsafe behavior, ignoring/not comprehending instructions, attempting to sit before close to w/c, and would have fallen if therapist was not able to pull chair close and direct pt's bottom into chair. Hand hygiene at w/c level with assist from her sister while therapist located pt's nurse. Pt remained in w/c at end of session and was left with all needs in reach and  alarm active and her sister present.   Therapy Documentation Precautions:  Precautions Precautions: Fall Precaution Comments: fatigues quickly Restrictions Weight Bearing Restrictions: No General:      Therapy/Group: Individual Therapy  BEULAH CAPOBIANCO 04/23/2021, 12:38 PM

## 2021-04-24 DIAGNOSIS — R5381 Other malaise: Secondary | ICD-10-CM | POA: Diagnosis not present

## 2021-04-24 LAB — COMPREHENSIVE METABOLIC PANEL
ALT: 34 U/L (ref 0–44)
AST: 55 U/L — ABNORMAL HIGH (ref 15–41)
Albumin: 2.4 g/dL — ABNORMAL LOW (ref 3.5–5.0)
Alkaline Phosphatase: 62 U/L (ref 38–126)
Anion gap: 8 (ref 5–15)
BUN: 15 mg/dL (ref 8–23)
CO2: 23 mmol/L (ref 22–32)
Calcium: 9.3 mg/dL (ref 8.9–10.3)
Chloride: 103 mmol/L (ref 98–111)
Creatinine, Ser: 0.85 mg/dL (ref 0.44–1.00)
GFR, Estimated: >60 mL/min (ref >60)
Glucose, Bld: 90 mg/dL (ref 70–99)
Potassium: 4.1 mmol/L (ref 3.5–5.1)
Sodium: 134 mmol/L — ABNORMAL LOW (ref 135–145)
Total Bilirubin: 0.8 mg/dL (ref 0.3–1.2)
Total Protein: 6.1 g/dL — ABNORMAL LOW (ref 6.5–8.1)

## 2021-04-24 NOTE — Progress Notes (Signed)
Speech Language Pathology Daily Session Note  Patient Details  Name: Carrie [name] MRN: [id number] Date of Birth: 12/02/1960  Today's Date: 04/24/2021 SLP Individual Time: 1345-1445 SLP Individual Time Calculation (min): 60 min  Short Term Goals: Week 2: SLP Short Term Goal 1 (Week 2): STG = LTG d/t ELOS  Skilled Therapeutic Interventions:   Patient seen in room for skilled ST session with SLP coworker observing as well. Patient reported that she was sleeping, but she was alert and willing to participate in session. When asked month, she was incorrect, however when SLP asked her if she had a calendar, she then looked to calendar on wall and read "November". She read year as "2002" but when given two choice cues, picked "2022". SLP then presented paper calendar for patient to locate today's date. She required mod-maxA cues to find date that was not marked off and then determine day of week as well. She appears internally and externally distracted. At one point, she abruptly starts crying and saying she misses her Dad. She quickly calmed down, saying her dad has been dead for 15 years. SLP then requested patient write down her name followed by her address. She was able to do so but with a lot of hand tremor and writing of street name not legible. She then commented, "where are my glasses?" She told SLP they were in her pocketbook but she then retrieved sunglasses , saying they are prescription. (This turned out not to be true). When SLP introduced task of marking calendar with simulated birthday invitation, patient perseverated some on saying that "[employer]" was a restaurant she has been to. She required mod-maxA cues to direct and redirect her to complete task. When SLP asked patient to try on glasses that appeared to be prescription, she put them on and was able to read small print, acting surprised at how clear the print was. Overall, patient participated fully, but continues with internal and  external distraction, decreased sustained, selective and alternating attention, decreased orientation to time, and slow cognitive processing. She was left in bed with all needs within reach and bed alarm on. She continues to benefit from skilled SLP intervention to maximize cognitive function prior to discharge.  Pain Pain Assessment Pain Scale: 0-10 Pain Score: 0-No pain  Therapy/Group: Individual Therapy  Angela Nevin, MA, CCC-SLP Speech Therapy

## 2021-04-24 NOTE — Progress Notes (Signed)
Occupational Therapy Session Note  Patient Details  Name: Carrie [name] MRN: [id number] Date of Birth: 06-Jun-1961  Today's Date: 04/25/2021 OT Individual Time: 3151-7616 and 0737-1062 OT Individual Time Calculation (min): 55 min and 19 min 11 minutes missed  Skilled Therapeutic Interventions/Progress Updates:    Pt greeted in bed with no c/o pain, just fatigue. Initially refusing to use the restroom but willing to change clothes today. Supine<sit completed with increased time, vcs for motor planning and Mod A overall. Once EOB, pt asked to use the toilet. CGA-Min A for ambulatory transfer to the elevated toilet using RW. Min A to lower clothing and pt had +bladder void. CGA for balance during hygiene completion in standing. She doffed LB clothing using the dressing stick (as reachers are no longer in stock) with Mod-Max A and vcs for motor planning (sitting down). She ambulated back into the room and sat down in the w/c parked in front of the sink. She then engaged in UB/LB dressing tasks at sit<stand level using RW for standing support. Mod A and vcs for all sit<stands. Pt required step by step cues for using the dressing stick appropriately and needed vcs + Max A to use the sock aide to don her gripper socks. When given increased time to problem solve herself, pt is very disorganized and forgets what she is doing. Perseverating on seeing a cat in the room/hallway. RN informed of visual hallucination. Pt stating that she's "going crazy." Pt returned to the bed at close of session, Mod A via stand pivot. Assisted pt with repositioning for comfort and left her with all needs within reach and bed alarm set.   2nd Session 1:1 tx (19 min) Pt greeted in the w/c, requesting to use the restroom. After first attempt to stand with vcs using RW, pt unable to power up into full standing, needed a few minutes of seated rest before attempting again. After scooting forward, Min A for sit<stand using RW. Pt then  initiated stand pivot<bed. When asked about the bathroom, pt stated that she didn't have to go, looked confused that OT had mentioned it. Min A for lateral scooting up towards HOB. Before lying down, pt engaged in coloring to increase activity tolerance. She needed assistance to uncap/cap her marker. Note that pt was disoriented to month, year, and place. Aware that she was in [place]. Nursing informed. Left pt in bed, in care of nursing at close of session. Pt refused to participate any more due to fatigue. 11 minutes missed.  Therapy Documentation Precautions:  Precautions Precautions: Fall Precaution Comments: fatigues quickly Restrictions Weight Bearing Restrictions: No  ADL: ADL Eating: Set up Where Assessed-Eating: Wheelchair Grooming: Supervision/safety Where Assessed-Grooming: Sitting at sink Upper Body Bathing: Supervision/safety Where Assessed-Upper Body Bathing: Sitting at sink Lower Body Bathing: Minimal assistance Where Assessed-Lower Body Bathing: Standing at sink Upper Body Dressing: Supervision/safety Where Assessed-Upper Body Dressing: Sitting at sink Lower Body Dressing: Minimal assistance Where Assessed-Lower Body Dressing: Edge of bed Toileting: Minimal assistance Where Assessed-Toileting: Teacher, adult education: Curator Method: Proofreader: Grab bars, Raised toilet seat Tub/Shower Transfer: Not assessed Film/video editor: Not assessed   Therapy/Group: Individual Therapy  Mikailah Morel A Takira Sherrin 04/25/2021, 12:14 PM

## 2021-04-24 NOTE — Progress Notes (Signed)
PROGRESS NOTE   Subjective/Complaints:  Pt reports no issues- ate fruit cup and drank ensure- neither are on table to see how much she ate- Alb stable at 2.4.  Still "exhausted".   ROS:  Pt denies SOB, abd pain, CP, N/V/C/D, and vision changes   Objective:   No results found. Recent Labs    04/22/21 0511  WBC 5.4  HGB 11.3*  HCT 33.2*  PLT 227     Recent Labs    04/22/21 0511 04/24/21 0456  NA 136 134*  K 4.1 4.1  CL 104 103  CO2 23 23  GLUCOSE 89 90  BUN 15 15  CREATININE 0.94 0.85  CALCIUM 9.2 9.3      Intake/Output Summary (Last 24 hours) at 04/24/2021 0900 Last data filed at 04/23/2021 1905 Gross per 24 hour  Intake 240 ml  Output --  Net 240 ml        Physical Exam: Vital Signs Blood pressure 122/81, pulse 100, temperature 97.8 F (36.6 C), temperature source Oral, resp. rate 15, height 5\' 5"  (1.651 m), weight 71.1 kg, SpO2 100 %.            General: awake, alert, but sleepy- woke to verbal stimuli; supine in bed; NAD HENT: conjugate gaze; oropharynx moist CV: regular rate; no JVD Pulmonary: CTA B/L; no W/R/R- good air movement GI: soft, NT, ND, (+)BS- hypoactive Psychiatric: appropriate but extremely flat- no spontaneous speech Neurological: STM issues noted still.   Musculoskeletal:     Cervical back: Normal range of motion. No rigidity.     Comments: 4+/5 in arms proximal and distally B/L HF 4-/5; KE 4/5 DF/PF 4+/5 B/L  No swollen joints seen on exam  Skin:    Comments: Intact to light touch on exam B/L  Light touch intact x all 4 extremities.    Assessment/Plan: 1. Functional deficits which require 3+ hours per day of interdisciplinary therapy in a comprehensive inpatient rehab setting. Physiatrist is providing close team supervision and 24 hour management of active medical problems listed below. Physiatrist and rehab team continue to assess barriers to  discharge/monitor patient progress toward functional and medical goals  Care Tool:  Bathing    Body parts bathed by patient: Right arm, Left arm, Chest, Abdomen, Face, Right upper leg, Left upper leg, Front perineal area   Body parts bathed by helper: Right lower leg, Left lower leg, Buttocks     Bathing assist Assist Level: Moderate Assistance - Patient 50 - 74%     Upper Body Dressing/Undressing Upper body dressing   What is the patient wearing?: Pull over shirt    Upper body assist Assist Level: Minimal Assistance - Patient > 75%    Lower Body Dressing/Undressing Lower body dressing      What is the patient wearing?: Pants, Underwear/pull up     Lower body assist Assist for lower body dressing: Maximal Assistance - Patient 25 - 49%     Toileting Toileting    Toileting assist Assist for toileting: Moderate Assistance - Patient 50 - 74%     Transfers Chair/bed transfer  Transfers assist     Chair/bed transfer assist level: Minimal Assistance -  Patient > 75%     Locomotion Ambulation   Ambulation assist      Assist level: Minimal Assistance - Patient > 75% Assistive device: Walker-rolling Max distance: 92   Walk 10 feet activity   Assist     Assist level: Minimal Assistance - Patient > 75% Assistive device: Walker-rolling   Walk 50 feet activity   Assist Walk 50 feet with 2 turns activity did not occur: Safety/medical concerns  Assist level: Minimal Assistance - Patient > 75% Assistive device: Walker-rolling    Walk 150 feet activity   Assist Walk 150 feet activity did not occur: Safety/medical concerns         Walk 10 feet on uneven surface  activity   Assist Walk 10 feet on uneven surfaces activity did not occur: Safety/medical concerns         Wheelchair     Assist Is the patient using a wheelchair?: Yes Type of Wheelchair: Manual    Wheelchair assist level: Total Assistance - Patient < 25% Max wheelchair  distance: 150    Wheelchair 50 feet with 2 turns activity    Assist        Assist Level: Total Assistance - Patient < 25%   Wheelchair 150 feet activity     Assist      Assist Level: Total Assistance - Patient < 25%   Blood pressure 122/81, pulse 100, temperature 97.8 F (36.6 C), temperature source Oral, resp. rate 15, height 5\' 5"  (1.651 m), weight 71.1 kg, SpO2 100 %.    Medical Problem List and Plan: 1.  Debility secondary to generalized pain/rheumatoid arthritis.  Follow-up outpatient rheumatology services Dr .  Prednisone taper as indicated             -patient may  shower             -ELOS/Goals: 5-7 days mod I  Pt walked 70 ft x2 with RW/min A-    Con't Continue CIR- PT, OT and SLP- will d/w therapy how she's doing.  2.  Antithrombotics: -DVT/anticoagulation:  Pharmaceutical: Lovenox             -antiplatelet therapy: n/a 3. Pain Management: Tramadol as needed  11/6- on stable prednisone dose- pain controlled- pt has moon facies as side effect, will reduce to 5mg  in am   11/14- pain controlled- con't regimen 4. Mood: Wellbutrin 300 mg daily, Lexapro 20 mg daily  11/12- added Abilify 5 mg QHS - con't regimen  11/14- seeing neuropsych today- to verify the dx. Might think about adding Ritalin to jump start thing.   11/16- added Ritalin- can't tell if was helpful as of yet- spoke to PT_ she will let me know  11/17- waiting to hear from PT/OT/SLP how she's doing with Ritalin added- doesn't look any different this AM             -antipsychotic agents: N/A 5. Neuropsych: This patient is? capable of making decisions on her own behalf. 6. Skin/Wound Care: Routine skin checks 7. Fluids/Electrolytes/Nutrition: Routine in and outs with follow-up chemistries 8.  Hypertension.  Norvasc 5 mg daily, lisinopril 30 mg daily.  Monitor with increased mobility   Vitals:   04/23/21 2045 04/24/21 0617  BP: 129/90 122/81  Pulse: (!) 107 100  Resp: 16 15  Temp: 98.3 F  (36.8 C) 97.8 F (36.6 C)  SpO2: 99% 100%    11/7- will stop Norvasc 5 mg daily as of Tuesday and recheck labs in Am to  see if needs IVFs  11/8- will decrease Lisinopril from 20 mg daily to 5 mg daily and start ACE wraps and TEDs to knee B/L- also, push fluids- explained to pt needs 6-8 cups/water/day.   11/17- off BP meds- pulse in mid 100s, but BP too low to start B blocker 9.  Elevated LFTs/transaminitis- as well as CK.  Improved.  Follow-up chemistries  11/2- coming down- is 60/50 now- 10/24 was 150/135- so improving- con't to monitor  11/8- down to 50/41- con't monitoring  11/17- LFTs 55/34- stable- con't to monitor 10.  Hyperlipidemia.  Lipitor held due to elevated LFTs 11. Poor energy  11/3- will check TSH and Vit D levels in AM- could be causing 11/6 vit D level mildly reduced will supplement 1000IU daily  12. Hx of depression  11/3- on wellbutrin and Lexapro- high doses- will con't  11/10- sister thinks she's bipolar- Will start Abilify 5 mg QHS as adjuvant- think could help pull her out of depressed mood and poor initiation and now cognitive issues?.   11/11- MRI (-) for stroke, etc- so will continue down the road that this is likely severe depression per d/w sister who agrees.   11/14- seeing neuropsych today- will think about Ritalin for initiation.   11/15- will start Ritalin 76m BID to jump start meds.   11/16- can't tell if helpful quite yet- PT and OT will let me know if makes a difference  11/17- will seek out therapy to ask how she's progressing and responding with Ritalin.  13. Hypotension  11/8- dramatically decrease lisinopril and add ACE wraps/TEDs and have pt drink more- per CMP, not dry, so wait on IVFs.  14. Moderate to severe malnutrition  11/9- have ordered dietician consult Since pt's albumin is dropping.   11/14- pt still not eating- will verify has supplements 3x/day.   11/15- will check labs Thursday- CMP  11/17- Alb stable at 2.4   LOS: 16 days A FACE  TO FACE EVALUATION WAS PERFORMED  Keeton Kassebaum 04/24/2021, 9:00 AM

## 2021-04-24 NOTE — Progress Notes (Signed)
Physical Therapy Weekly Progress Note  Patient Details  Name: Carrie [name] MRN: [id number] Date of Birth: 22-Jan-1961  Beginning of progress report period: April 17, 2021 End of progress report period: April 24, 2021  Today's Date: 04/24/2021 PT Individual Time: 0900-1000 PT Individual Time Calculation (min): 60 min   Patient has met 2 of 3 short term goals.  Pt able to ambulate >25 ft, but does not consistently participate with therapy.   Patient continues to demonstrate the following deficits muscle weakness, decreased cardiorespiratoy endurance, decreased initiation, decreased attention, decreased awareness, decreased problem solving, decreased safety awareness, decreased memory, and delayed processing, and decreased sitting balance, decreased standing balance, decreased postural control, and decreased balance strategies and therefore will continue to benefit from skilled PT intervention to increase functional independence with mobility.  Patient progressing toward long term goals..  Continue plan of care.  PT Short Term Goals Week 2:  PT Short Term Goal 1 (Week 2): Pt will ambulate >25 ft conistently PT Short Term Goal 1 - Progress (Week 2): Partly met PT Short Term Goal 2 (Week 2): Pt will propell w/c >50 ft PT Short Term Goal 2 - Progress (Week 2): Met PT Short Term Goal 3 (Week 2): Pt will initiate stair training PT Short Term Goal 3 - Progress (Week 2): Met Week 3:  PT Short Term Goal 1 (Week 3): =LTGs d/t ELOS  Skilled Therapeutic Interventions/Progress Updates:  pt received in bed and agreeable to therapy. Donned ted hose in supine with tot A. Supine>sit with min facilitation for upright trunk and HOB upright. Pt requsted to use the bathroom. Sit to stand with min A from elevated surface, pt recalled hand placement without cue. Some retropulsion on standing, but able to correct with min A. ambulatory transfer to commode with min A. Pt doffed/donned pants and underwear with  tot A. Required extended time for void. While seated on commode, pt began shaking. Continued to shake for several minutes, eventually subsiding enough to stand for tot A hygiene. Stand pivot transfer to w/c with min A. Immediately upon sitting, pt reported she needed to use bathroom again. Stand pivot transfer W/c<>commode, no further void. Therapist then donned ace wraps at close of session and pt remained in w/c, was left with all needs in reach and alarm active.    Ambulation/gait training;DME/adaptive equipment instruction;Psychosocial support;UE/LE Strength taining/ROM;Balance/vestibular training;Functional electrical stimulation;Skin care/wound management;UE/LE Coordination activities;Cognitive remediation/compensation;Functional mobility training;Splinting/orthotics;Visual/perceptual remediation/compensation;Community reintegration;Neuromuscular re-education;Stair training;Wheelchair propulsion/positioning;Discharge planning;Pain management;Therapeutic Activities;Disease management/prevention;Patient/family education;Therapeutic Exercise   Therapy Documentation Precautions:  Precautions Precautions: Fall Precaution Comments: fatigues quickly Restrictions Weight Bearing Restrictions: No General:     Therapy/Group: Individual Therapy  [name] 04/24/2021, 7:59 AM

## 2021-04-24 NOTE — Progress Notes (Signed)
Occupational Therapy Session Note  Patient Details  Name: Carrie [name] MRN: [id number] Date of Birth: 04/24/1961  Today's Date: 04/24/2021 OT Individual Time: 1045-1200 OT Individual Time Calculation (min): 75 min    Short Term Goals: Week 2:  OT Short Term Goal 1 (Week 2): Pt will perform shower level bathing with no more than Min A and AE PRN OT Short Term Goal 1 - Progress (Week 2): Not met OT Short Term Goal 2 (Week 2): Pt will ambulate to/from bathroom with LRAD Supervision OT Short Term Goal 2 - Progress (Week 2): Not met OT Short Term Goal 3 (Week 2): Pt will utilize energy conservation techniques during ADL with no more than Min verbal cues OT Short Term Goal 3 - Progress (Week 2): Not met Week 3:  OT Short Term Goal 1 (Week 3): STGs = LTGs 2/2 ELOS   Skilled Therapeutic Interventions/Progress Updates:    Pt greeted at time of session sitting up in wheelchair with nursing staff, hand off to OT. Pt did not c/o pain except occasional sensitivity to touch 2/2 arthritic changes. Transferred room > 4th floor [place] tower and focused on functional mobility and ambulation for global endurance and improved OOB tolerance with 2 rounds of 50" intervals with close wheelchair follow. Note pt needing max encouragement to participate 2/2 fatigue. Extended time needed for all tasks as encouraged pt to perform own locking/unlocking of brakes and wheelchair management, also needing multiple attempts to come up to standing each trial with cues for increased anterior weight shift and body mechanics. O2 sats 96% after both trials of walking. Extensive discussion with pt regarding DC home for problem solving situations at home such as calling for assist, having phone on her person at all times, what to do in case of a fall, etc. Note poor awareness, problem solving, and slow processing. Pt fatigued and stating she could not do any more therapy at this time, wheelchair transport back to room. Sister Carrie [name]  present at this time, having extensive questions regarding pt CLOF, deficits, assist at home, etc. Recommended to sister that staff recommend 24/7 assist 2/2 CLOF, sister stating family members work and cannot do this. Will pass along to SW request for aide list. Walked short distance in room to recliner CGA after Mod sit > stand. Set up alarm on call bell in reach.    Therapy Documentation Precautions:  Precautions Precautions: Fall Precaution Comments: fatigues quickly Restrictions Weight Bearing Restrictions: No    Therapy/Group: Individual Therapy  Carrie [name] 04/24/2021, 7:23 AM

## 2021-04-25 DIAGNOSIS — R5381 Other malaise: Secondary | ICD-10-CM | POA: Diagnosis not present

## 2021-04-25 MED ORDER — BISACODYL 10 MG RE SUPP: 10.0000 mg | Freq: Every day | RECTAL | Status: DC | PRN

## 2021-04-25 NOTE — Progress Notes (Signed)
Speech Language Pathology Daily Session Note  Patient Details  Name: Carrie [name] MRN: [id number] Date of Birth: 1960/12/08  Today's Date: 04/25/2021 SLP Individual Time: 1445-1530 SLP Individual Time Calculation (min): 45 min  Short Term Goals: Week 2: SLP Short Term Goal 1 (Week 2): STG = LTG d/t ELOS  Skilled Therapeutic Interventions:   Patient seen for skilled ST session in room with focus on cognitive function goals. RN and NT in room getting patient into bed from [facility] when SLP arrived. They both reported that patient continues to appear to decline cognitively and today she has had visual hallucinations of people. During session, patient required mod-maxA verbal cues to redirect and she had a lot of difficulty focusing, becoming tangential then forgetting what SLP had asked her. She stated month as August, then stated, "I keep thinking about my birthday" (birth month is [birth year]). She was not able to then determine current month until SLP pointed to calendar on wall. Patient did exhibit instances of either visual hallucinations versus auditory ones, asking SLP, "Was that a motorcyle?" And when asked if it was a sound, replying, "just the shape of it". She also told SLP that there was "a man down there making noise" and that "I think they made two rooms". SLP was able to convince patient to eat some yogurt and she also reviewed ala carte portion of menu and indicated her likes/dislikes. SLP posted this on door. Patient left in bed with all needs within reach. She continues to benefit from skilled SLP intervention to maximize cognitive functioning prior to discharge.   Pain Pain Assessment Pain Scale: Faces Pain Score: 0-No pain Faces Pain Scale: No hurt  Therapy/Group: Individual Therapy  Angela Nevin, MA, CCC-SLP Speech Therapy

## 2021-04-25 NOTE — Progress Notes (Signed)
PROGRESS NOTE   Subjective/Complaints:  Pt reports no issues- haven't eaten the breakfast== BLT at bedside- was asleep said will wake up to eat- asked staff if they can monitor pt eating this AM.    ROS:  Pt denies SOB, abd pain, CP, N/V/C/D, and vision changes   Objective:   No results found. No results for input(s): WBC, HGB, HCT, PLT in the last 72 hours.    Recent Labs    04/24/21 0456  NA 134*  K 4.1  CL 103  CO2 23  GLUCOSE 90  BUN 15  CREATININE 0.85  CALCIUM 9.3      Intake/Output Summary (Last 24 hours) at 04/25/2021 1003 Last data filed at 04/24/2021 2000 Gross per 24 hour  Intake 480 ml  Output --  Net 480 ml        Physical Exam: Vital Signs Blood pressure 107/78, pulse 100, temperature 98.2 F (36.8 C), temperature source Oral, resp. rate 18, height 5\' 5"  (1.651 m), weight 71.1 kg, SpO2 98 %.    General: awake, alert, appropriate, but sleepy- just woke her up; food at bedside untouched; NAD HENT: conjugate gaze; oropharynx moist CV: regular rate; no JVD Pulmonary: CTA B/L; no W/R/R- good air movement GI: soft, NT, ND, (+)BS- normoactive Psychiatric: flat, depressed;  Neurological: STM issues- sleepy  Musculoskeletal:     Cervical back: Normal range of motion. No rigidity.     Comments: 4+/5 in arms proximal and distally B/L HF 4-/5; KE 4/5 DF/PF 4+/5 B/L  No swollen joints seen on exam  Skin:    Comments: Intact to light touch on exam B/L  Light touch intact x all 4 extremities.    Assessment/Plan: 1. Functional deficits which require 3+ hours per day of interdisciplinary therapy in a comprehensive inpatient rehab setting. Physiatrist is providing close team supervision and 24 hour management of active medical problems listed below. Physiatrist and rehab team continue to assess barriers to discharge/monitor patient progress toward functional and medical goals  Care  Tool:  Bathing    Body parts bathed by patient: Right arm, Left arm, Chest, Abdomen, Face, Right upper leg, Left upper leg, Front perineal area   Body parts bathed by helper: Right lower leg, Left lower leg, Buttocks     Bathing assist Assist Level: Moderate Assistance - Patient 50 - 74%     Upper Body Dressing/Undressing Upper body dressing   What is the patient wearing?: Pull over shirt    Upper body assist Assist Level: Minimal Assistance - Patient > 75%    Lower Body Dressing/Undressing Lower body dressing      What is the patient wearing?: Pants, Underwear/pull up     Lower body assist Assist for lower body dressing: Maximal Assistance - Patient 25 - 49%     Toileting Toileting    Toileting assist Assist for toileting: Moderate Assistance - Patient 50 - 74%     Transfers Chair/bed transfer  Transfers assist     Chair/bed transfer assist level: Minimal Assistance - Patient > 75%     Locomotion Ambulation   Ambulation assist      Assist level: Minimal Assistance - Patient > 75%  Assistive device: Walker-rolling Max distance: 92   Walk 10 feet activity   Assist     Assist level: Minimal Assistance - Patient > 75% Assistive device: Walker-rolling   Walk 50 feet activity   Assist Walk 50 feet with 2 turns activity did not occur: Safety/medical concerns  Assist level: Minimal Assistance - Patient > 75% Assistive device: Walker-rolling    Walk 150 feet activity   Assist Walk 150 feet activity did not occur: Safety/medical concerns         Walk 10 feet on uneven surface  activity   Assist Walk 10 feet on uneven surfaces activity did not occur: Safety/medical concerns         Wheelchair     Assist Is the patient using a wheelchair?: Yes Type of Wheelchair: Manual    Wheelchair assist level: Total Assistance - Patient < 25% Max wheelchair distance: 150    Wheelchair 50 feet with 2 turns activity    Assist         Assist Level: Total Assistance - Patient < 25%   Wheelchair 150 feet activity     Assist      Assist Level: Total Assistance - Patient < 25%   Blood pressure 107/78, pulse 100, temperature 98.2 F (36.8 C), temperature source Oral, resp. rate 18, height 5\' 5"  (1.651 m), weight 71.1 kg, SpO2 98 %.    Medical Problem List and Plan: 1.  Debility secondary to generalized pain/rheumatoid arthritis.  Follow-up outpatient rheumatology services Dr .  Prednisone taper as indicated             -patient may  shower             -ELOS/Goals: 5-7 days mod I  Pt walked 70 ft x2 with RW/min A-    11/18- didn't do better on Ritalin- will stop and see if changes OFF Ritalin-   2.  Antithrombotics: -DVT/anticoagulation:  Pharmaceutical: Lovenox             -antiplatelet therapy: n/a 3. Pain Management: Tramadol as needed  11/6- on stable prednisone dose- pain controlled- pt has moon facies as side effect, will reduce to 5mg  in am   11/14- pain controlled- con't regimen 4. Mood: Wellbutrin 300 mg daily, Lexapro 20 mg daily  11/12- added Abilify 5 mg QHS - con't regimen  11/14- seeing neuropsych today- to verify the dx. Might think about adding Ritalin to jump start thing.   11/16- added Ritalin- can't tell if was helpful as of yet- spoke to PT_ she will let me know  11/17- waiting to hear from PT/OT/SLP how she's doing with Ritalin added- doesn't look any different this AM  11/18- no significant difference per team- will d/c.              -antipsychotic agents: N/A 5. Neuropsych: This patient is? capable of making decisions on her own behalf. 6. Skin/Wound Care: Routine skin checks 7. Fluids/Electrolytes/Nutrition: Routine in and outs with follow-up chemistries 8.  Hypertension.  Norvasc 5 mg daily, lisinopril 30 mg daily.  Monitor with increased mobility   Vitals:   04/24/21 1945 04/25/21 0429  BP: 112/78 107/78  Pulse: (!) 110 100  Resp: 18 18  Temp: 99.1 F (37.3 C) 98.2 F  (36.8 C)  SpO2: 98% 98%    11/18- BP is controlled- however HR is elevated still- cannot add b blocker due to lower BP.  9.  Elevated LFTs/transaminitis- as well as CK.  Improved.  Follow-up  chemistries  11/2- coming down- is 60/50 now- 10/24 was 150/135- so improving- con't to monitor  11/8- down to 50/41- con't monitoring  11/17- LFTs 55/34- stable- con't to monitor 10.  Hyperlipidemia.  Lipitor held due to elevated LFTs 11. Poor energy  11/3- will check TSH and Vit D levels in AM- could be causing 11/6 vit D level mildly reduced will supplement 1000IU daily  12. Hx of depression  11/3- on wellbutrin and Lexapro- high doses- will con't  11/10- sister thinks she's bipolar- Will start Abilify 5 mg QHS as adjuvant- think could help pull her out of depressed mood and poor initiation and now cognitive issues?.   11/11- MRI (-) for stroke, etc- so will continue down the road that this is likely severe depression per d/w sister who agrees.   11/14- seeing neuropsych today- will think about Ritalin for initiation.   11/15- will start Ritalin 29m BID to jump start meds.   11/16- can't tell if helpful quite yet- PT and OT will let me know if makes a difference  11/17- will seek out therapy to ask how she's progressing and responding with Ritalin.  11/18- no better- will stop Ritalin 13. Hypotension  11/8- dramatically decrease lisinopril and add ACE wraps/TEDs and have pt drink more- per CMP, not dry, so wait on IVFs.  14. Moderate to severe malnutrition  11/9- have ordered dietician consult Since pt's albumin is dropping.   11/14- pt still not eating- will verify has supplements 3x/day.   11/15- will check labs Thursday- CMP  11/17- Alb stable at 2.4   LOS: 17 days A FACE TO FACE EVALUATION WAS PERFORMED  Modelle [name] 04/25/2021, 10:03 AM

## 2021-04-25 NOTE — Progress Notes (Signed)
Recreational Therapy Discharge Summary Patient Details  Name: Carrie [name] MRN: [id number] Date of Birth: 06-08-1961 Today's Date: 04/25/2021  Long term goals set: 1  Long term goals met: 1  Comments on progress toward goals:  Pt participated in TR services for coping/stress management and pet therapy visits.  Pt requires verbal cues for emotional support.  Pt is supervision for simple tasks seated but needs additional assistance for standing tasks.  Reasons goals not met: n/a  Equipment acquired: n/a  Reasons for discharge: discharge from hospital  Patient/family agrees with progress made and goals achieved: Yes  Carrie [name] 04/25/2021, 12:59 PM

## 2021-04-25 NOTE — Progress Notes (Signed)
Recreational Therapy Session Note  Patient Details  Name: Carrie [name] MRN: [id number] Date of Birth: 17-Sep-1960 Today's Date: 04/25/2021  Pain: no c/o Skilled Therapeutic Interventions/Progress Updates: Pt requested toileting upon arrival.  Pt performed stand pivot transfer bed ->w/c with mod assist/min cues.  Pt performed toilet transfer with min assist and for toileting.  Pt requesting to return to bed until next therapy session.    Therapy/Group: Individual Therapy   Jaydien Panepinto 04/25/2021, 12:54 PM

## 2021-04-25 NOTE — Progress Notes (Signed)
Physical Therapy Session Note  Patient Details  Name: Carrie [name] MRN: [id number] Date of Birth: 04-15-1961  Today's Date: 04/25/2021 PT Individual Time: 1300-1400 PT Individual Time Calculation (min): 60 min   Short Term Goals: Week 1:  PT Short Term Goal 1 (Week 1): Patient will complete bed mobility with CGA consistently PT Short Term Goal 2 (Week 1): Patient will transfer bed <> wc with LRAD and CGA consistently PT Short Term Goal 2 - Progress (Week 1): Not met PT Short Term Goal 3 (Week 1): Patient will ambulate >54ft with LRAD and CGA PT Short Term Goal 3 - Progress (Week 1): Not met Week 2:  PT Short Term Goal 1 (Week 2): Pt will ambulate >25 ft conistently PT Short Term Goal 1 - Progress (Week 2): Partly met PT Short Term Goal 2 (Week 2): Pt will propell w/c >50 ft PT Short Term Goal 2 - Progress (Week 2): Met PT Short Term Goal 3 (Week 2): Pt will initiate stair training PT Short Term Goal 3 - Progress (Week 2): Met  Skilled Therapeutic Interventions/Progress Updates:    pt received in bed and agreeable to therapy. Pt requesting to use bathroom. Of note, pt also made same request of therapist in session immediately after this one, and of nursing shortly after that. Pt demoed confusion on whether or not she had gone to the bathroom in this session. While discussing poor safety awareness at sink while trying to get a drink of water for her sore throat,  pt later stated she was panicked about having a bout of incontinence. No void this session, possible pattern emerging. Pt was mildly confabulatory throughout session and was not oriented to time, even with using schedule and clock to determine time. Required max-tot cueing over ~10 min to determine when session would end.   Pt performed supine>sit from flat bed with max VC for technique, min facilitation for upright posture. Max A for Sit to stand throughout d/t retropulsion. Once, pt immediately plopped onto bed with no eccentric  control, stating pain in her posterior legs, denying any joint pain and not reporting pain any other time during session when asked. ambulatory transfer into bathroom with max A for balance. No void, tot A for hygiene, mod A for balance during clothing management. While heading to sink for hand hygiene, pt ignored/did not process VC to step closer to sink because she was attempting to get a drink from cup on sink, reaching outside BOS and remaining 8-12 inches from RW. Pt performed 3 short bouts of ambulation with max encouragement, mod A for balance and max VC for safety awareness. Pt remained in w/c to await pending OT session. Pt was left with all needs in reach and alarm active.   Therapy Documentation Precautions:  Precautions Precautions: Fall Precaution Comments: fatigues quickly Restrictions Weight Bearing Restrictions: No    Therapy/Group: Individual Therapy  Carrie [name] 04/25/2021, 4:28 PM

## 2021-04-26 DIAGNOSIS — F32A Depression, unspecified: Secondary | ICD-10-CM

## 2021-04-26 DIAGNOSIS — R5381 Other malaise: Secondary | ICD-10-CM | POA: Diagnosis not present

## 2021-04-26 DIAGNOSIS — I10 Essential (primary) hypertension: Secondary | ICD-10-CM

## 2021-04-26 DIAGNOSIS — E871 Hypo-osmolality and hyponatremia: Secondary | ICD-10-CM | POA: Diagnosis not present

## 2021-04-26 NOTE — Progress Notes (Signed)
PROGRESS NOTE   Subjective/Complaints: Patient seen sitting up in bed this morning.  She states she slept well overnight.  She denies complaints.  She notes improving strength, stating "I was not able to do that yesterday" (when lifting her left leg).  ROS: Denies CP, SOB, N/V/D  Objective:   No results found. No results for input(s): WBC, HGB, HCT, PLT in the last 72 hours.    Recent Labs    04/24/21 0456  NA 134*  K 4.1  CL 103  CO2 23  GLUCOSE 90  BUN 15  CREATININE 0.85  CALCIUM 9.3       Intake/Output Summary (Last 24 hours) at 04/26/2021 1249 Last data filed at 04/25/2021 2100 Gross per 24 hour  Intake 480 ml  Output --  Net 480 ml         Physical Exam: Vital Signs Blood pressure 107/73, pulse 99, temperature 97.7 F (36.5 C), temperature source Oral, resp. rate 16, height 5\' 5"  (1.651 m), weight 71.1 kg, SpO2 97 %. Constitutional: No distress . Vital signs reviewed. HENT: Normocephalic.  Atraumatic. Eyes: EOMI. No discharge. Cardiovascular: No JVD.  RRR. Respiratory: Normal effort.  No stridor.  Bilateral clear to auscultation. GI: Non-distended.  BS +. Skin: Warm and dry.  Intact. Psych: Normal mood.  Normal behavior. Musc: No edema in extremities.  No tenderness in extremities. Neuro: Alert Motor: 4+/5 in arms proximal and distally B/L HF 4/5; KE 4/5 DF/PF 4+/5 B/L   Assessment/Plan: 1. Functional deficits which require 3+ hours per day of interdisciplinary therapy in a comprehensive inpatient rehab setting. Physiatrist is providing close team supervision and 24 hour management of active medical problems listed below. Physiatrist and rehab team continue to assess barriers to discharge/monitor patient progress toward functional and medical goals  Care Tool:  Bathing    Body parts bathed by patient: Right arm, Left arm, Chest, Abdomen, Face, Right upper leg, Left upper leg, Front  perineal area   Body parts bathed by helper: Right lower leg, Left lower leg, Buttocks     Bathing assist Assist Level: Moderate Assistance - Patient 50 - 74%     Upper Body Dressing/Undressing Upper body dressing   What is the patient wearing?: Pull over shirt    Upper body assist Assist Level: Minimal Assistance - Patient > 75%    Lower Body Dressing/Undressing Lower body dressing      What is the patient wearing?: Pants, Underwear/pull up     Lower body assist Assist for lower body dressing: Maximal Assistance - Patient 25 - 49%     Toileting Toileting    Toileting assist Assist for toileting: Moderate Assistance - Patient 50 - 74%     Transfers Chair/bed transfer  Transfers assist     Chair/bed transfer assist level: Minimal Assistance - Patient > 75%     Locomotion Ambulation   Ambulation assist      Assist level: Minimal Assistance - Patient > 75% Assistive device: Walker-rolling Max distance: 92   Walk 10 feet activity   Assist     Assist level: Minimal Assistance - Patient > 75% Assistive device: Walker-rolling   Walk 50 feet activity  Assist Walk 50 feet with 2 turns activity did not occur: Safety/medical concerns  Assist level: Minimal Assistance - Patient > 75% Assistive device: Walker-rolling    Walk 150 feet activity   Assist Walk 150 feet activity did not occur: Safety/medical concerns         Walk 10 feet on uneven surface  activity   Assist Walk 10 feet on uneven surfaces activity did not occur: Safety/medical concerns         Wheelchair     Assist Is the patient using a wheelchair?: Yes Type of Wheelchair: Manual    Wheelchair assist level: Total Assistance - Patient < 25% Max wheelchair distance: 150    Wheelchair 50 feet with 2 turns activity    Assist        Assist Level: Total Assistance - Patient < 25%   Wheelchair 150 feet activity     Assist      Assist Level: Total  Assistance - Patient < 25%   Blood pressure 107/73, pulse 99, temperature 97.7 F (36.5 C), temperature source Oral, resp. rate 16, height 5\' 5"  (1.651 m), weight 71.1 kg, SpO2 97 %.    Medical Problem List and Plan: 1.  Debility secondary to generalized pain/rheumatoid arthritis.  Follow-up outpatient rheumatology services Dr .  Prednisone taper as indicated  Continue CIR 2.  Antithrombotics: -DVT/anticoagulation:  Pharmaceutical: Lovenox             -antiplatelet therapy: n/a 3. Pain Management: Tramadol as needed  11/6- on stable prednisone dose- pain controlled- pt has moon facies as side effect, reduced to 5mg    Controlled with meds on 11/19 4. Mood: Wellbutrin 300 mg daily, Lexapro 20 mg daily  11/12- added Abilify 5 mg QHS - con't regimen  Ritalin stopped on 11/18, monitor for changes             -antipsychotic agents: N/A 5. Neuropsych: This patient is? capable of making decisions on her own behalf. 6. Skin/Wound Care: Routine skin checks 7. Fluids/Electrolytes/Nutrition: Routine in and outs 8.  Hypertension.  Norvasc, lisinopril daily.  Monitor with increased mobility   Vitals:   04/26/21 0609 04/26/21 0610  BP: 107/73 107/73  Pulse: 99 99  Resp: 16 16  Temp: 97.7 F (36.5 C) 97.7 F (36.5 C)  SpO2: 97% 97%    Controlled on 11/19 9.  Elevated LFTs/transaminitis- AST mildly elevated on 11/17, continue to monitor 10.  Hyperlipidemia.  Lipitor held due to elevated LFTs 11. Poor energy vit D level mildly reduced will supplement 1000IU daily  12. Hx of depression  11/3- on wellbutrin and Lexapro- high doses- will con't  11/10- sister thinks she's bipolar-started Abilify 5 mg QHS as adjuvant  11/11- MRI (-) for stroke, etc  13. Moderate to severe malnutrition  dietician consult 14.  Mild hyponatremia  Sodium 134 on 11/17, continue to monitor  LOS: 18 days A FACE TO FACE EVALUATION WAS PERFORMED  Carrie [name] 13/11 04/26/2021, 12:49 PM

## 2021-04-26 NOTE — Progress Notes (Signed)
Speech Language Pathology Daily Session Note  Patient Details  Name: Carrie [name] MRN: [id number] Date of Birth: 01-15-1961  Today's Date: 04/26/2021 SLP Individual Time: 1401-1500 SLP Individual Time Calculation (min): 59 min  Short Term Goals: Week 2: SLP Short Term Goal 1 (Week 2): STG = LTG d/t ELOS  Skilled Therapeutic Interventions: Pt seen for skilled ST with focus on cognitive goals. Pt more alert and with increased sustained attention than previous tx session with this therapist, however continues to display language of confusion and decreased awareness. Pt requiring mod A cues to scan room to increase orientation concepts, pt able to carryover with min cues during session. SLP facilitating simple sequencing task for functional activities benefiting from mod A verbal and visual cues for ~50% accuracy. SLP and pt discussing upcoming discharge, pt endorses staying in bed in her room most of the day before but wants to increase activity. Discussed current cognitive and physical limitations and recommendation for ongoing therapy at d/c to promote safety and independence. Pt verbalizing understanding however will need continued training/ed. Pt left in wheelchair with alarm belt present and all needs within reach. Cont ST POC.   Pain Pain Assessment Pain Scale: 0-10 Pain Score: 0-No pain  Therapy/Group: Individual Therapy  Tacey Ruiz 04/26/2021, 2:10 PM

## 2021-04-28 DIAGNOSIS — F322 Major depressive disorder, single episode, severe without psychotic features: Secondary | ICD-10-CM | POA: Diagnosis not present

## 2021-04-28 DIAGNOSIS — M069 Rheumatoid arthritis, unspecified: Secondary | ICD-10-CM | POA: Diagnosis not present

## 2021-04-28 DIAGNOSIS — R5381 Other malaise: Secondary | ICD-10-CM | POA: Diagnosis not present

## 2021-04-28 LAB — CBC WITH DIFFERENTIAL/PLATELET
Abs Immature Granulocytes: 0.03 10*3/uL (ref 0.00–0.07)
Basophils Absolute: 0 10*3/uL (ref 0.0–0.1)
Basophils Relative: 0 %
Eosinophils Absolute: 0 10*3/uL (ref 0.0–0.5)
Eosinophils Relative: 1 %
HCT: 31.8 % — ABNORMAL LOW (ref 36.0–46.0)
Hemoglobin: 10.9 g/dL — ABNORMAL LOW (ref 12.0–15.0)
Immature Granulocytes: 1 %
Lymphocytes Relative: 16 %
Lymphs Abs: 0.8 10*3/uL (ref 0.7–4.0)
MCH: 31.3 pg (ref 26.0–34.0)
MCHC: 34.3 g/dL (ref 30.0–36.0)
MCV: 91.4 fL (ref 80.0–100.0)
Monocytes Absolute: 0.3 10*3/uL (ref 0.1–1.0)
Monocytes Relative: 6 %
Neutro Abs: 3.9 10*3/uL (ref 1.7–7.7)
Neutrophils Relative %: 76 %
Platelets: 270 10*3/uL (ref 150–400)
RBC: 3.48 MIL/uL — ABNORMAL LOW (ref 3.87–5.11)
RDW: 14 % (ref 11.5–15.5)
WBC: 5.1 10*3/uL (ref 4.0–10.5)
nRBC: 0 % (ref 0.0–0.2)

## 2021-04-28 LAB — COMPREHENSIVE METABOLIC PANEL
ALT: 32 U/L (ref 0–44)
AST: 51 U/L — ABNORMAL HIGH (ref 15–41)
Albumin: 2.4 g/dL — ABNORMAL LOW (ref 3.5–5.0)
Alkaline Phosphatase: 61 U/L (ref 38–126)
Anion gap: 6 (ref 5–15)
BUN: 15 mg/dL (ref 8–23)
CO2: 24 mmol/L (ref 22–32)
Calcium: 8.9 mg/dL (ref 8.9–10.3)
Chloride: 105 mmol/L (ref 98–111)
Creatinine, Ser: 0.8 mg/dL (ref 0.44–1.00)
GFR, Estimated: >60 mL/min (ref >60)
Glucose, Bld: 94 mg/dL (ref 70–99)
Potassium: 4 mmol/L (ref 3.5–5.1)
Sodium: 135 mmol/L (ref 135–145)
Total Bilirubin: 0.8 mg/dL (ref 0.3–1.2)
Total Protein: 6 g/dL — ABNORMAL LOW (ref 6.5–8.1)

## 2021-04-28 MED ORDER — NYSTATIN 100000 UNIT/ML MT SUSP
5.0000 mL | Freq: Four times a day (QID) | OROMUCOSAL | Status: DC
  Administered 2021-04-28 – 2021-04-30 (×9): 500000 [IU] via ORAL
  Filled 2021-04-28 (×9): qty 5

## 2021-04-28 NOTE — Progress Notes (Signed)
Occupational Therapy Session Note  Patient Details  Name: Carrie [name] MRN: [id number] Date of Birth: [birth year]  Today's Date: 04/28/2021 OT Individual Time: 4585-9292 OT Individual Time Calculation (min): 35 min  and Today's Date: 04/28/2021 OT Missed Time: 25 Minutes Missed Time Reason: Patient fatigue;Other (comment);Patient unwilling/refused to participate without medical reason (initial part of session 2/2 previous pt care, last part of session 2/2 fatigue and refusal)   Short Term Goals: Week 2:  OT Short Term Goal 1 (Week 2): Pt will perform shower level bathing with no more than Min A and AE PRN OT Short Term Goal 1 - Progress (Week 2): Not met OT Short Term Goal 2 (Week 2): Pt will ambulate to/from bathroom with LRAD Supervision OT Short Term Goal 2 - Progress (Week 2): Not met OT Short Term Goal 3 (Week 2): Pt will utilize energy conservation techniques during ADL with no more than Min verbal cues OT Short Term Goal 3 - Progress (Week 2): Not met Week 3:  OT Short Term Goal 1 (Week 3): STGs = LTGs 2/2 ELOS  Skilled Therapeutic Interventions/Progress Updates:    Missed 11 minutes at beginning of session 2/2 previous patient care. Pt sitting up in wheelchair at beginning of session stating she already dressed, brushed teeth, completed ADL and did not need to toilet. Offered pt out of room activity, IADL retraining in kitchen, and multiple other activities and pt consistently declined. Pt only agreeable to in room activity today and therapist returned with several [facility] activities. Pt attempting peg board activity for copying red/blue pattern, able to sequence 2 out of 10 and unable to match correct colors for remaining activity but was able to physically coordinate placing in board. Remainder of session on coordinating with the pt to make a "schedule" for returning home for wake/sleep, activities, meals, etc. Pt refusing further activity at this time. Pt up in chair for next PT session.  Missed total of 25 minutes of OT.  Therapy Documentation Precautions:  Precautions Precautions: Fall Precaution Comments: fatigues quickly Restrictions Weight Bearing Restrictions: No    Therapy/Group: Individual Therapy  Viona Gilmore 04/28/2021, 7:27 AM

## 2021-04-28 NOTE — Discharge Summary (Signed)
Physician Discharge Summary  Patient ID: Carrie [name] MRN: [id number] DOB/AGE: 12/20/1960 60 y.o.  Admit date: 04/08/2021 Discharge date: 04/30/2021  Discharge Diagnoses:  Principal Problem:   Debility Active Problems:   Rheumatoid arthritis flare ([place])   Fall (on) (from) other stairs and steps, subsequent encounter   Confusion   Hyponatremia   Depression Hypertension Elevated LFTs/transaminitis Hyperlipidemia  Discharged Condition: Stable  Significant Diagnostic Studies: MR BRAIN WO CONTRAST  Result Date: 04/17/2021 CLINICAL DATA:  Delirium; pt admitted after fall- on rehab- getting more and more confused- difficulty now with motor planning to understand to push w/c. EXAM: MRI HEAD WITHOUT CONTRAST TECHNIQUE: Multiplanar, multiecho pulse sequences of the brain and surrounding structures were obtained without intravenous contrast. COMPARISON:  Head CT March 26, 2021. FINDINGS: Brain: No acute infarction, hemorrhage, hydrocephalus, extra-axial collection or mass lesion. The brain parenchyma has normal signal characteristics. Mild parenchymal volume loss. Vascular: Normal flow voids. Skull and upper cervical spine: Normal marrow signal. Sinuses/Orbits: Negative. Other: Bilateral mastoid effusion, left greater than right. IMPRESSION: 1. No acute intracranial abnormality. 2. Mild parenchymal volume loss. 3. Mild bilateral mastoid effusion. Electronically Signed   By: Pedro Earls M.D.   On: 04/17/2021 14:23   DG CHEST PORT 1 VIEW  Result Date: 04/16/2021 CLINICAL DATA:  Decreased mentation, concern for infection EXAM: PORTABLE CHEST 1 VIEW COMPARISON:  03/26/2021 FINDINGS: Low lung volumes. Unchanged cardiac and mediastinal contours. No new focal pulmonary opacity. Bibasilar atelectasis. No definite pleural effusion. No acute osseous abnormality. IMPRESSION: No acute cardiopulmonary process. Electronically Signed   By: Merilyn Baba M.D.   On: 04/16/2021 11:25   US  Abdomen Limited RUQ (LIVER/GB)  Result Date: 03/31/2021 CLINICAL DATA:  Abnormal LFTs EXAM: ULTRASOUND ABDOMEN LIMITED RIGHT UPPER QUADRANT COMPARISON:  None FINDINGS: Gallbladder: Gallbladder distended by sludge. No definite shadowing calculi or gallbladder wall thickening. No pericholecystic fluid or sonographic Murphy sign. Common bile duct: Diameter: Suboptimally visualized, question 5 mm diameter Liver: Echogenic parenchyma, likely fatty infiltration though this can be seen with cirrhosis and certain infiltrative disorders. No gross hepatic mass or nodularity are identified though assessment of intrahepatic detail is suboptimal due to body habitus, bowel gas, and respiration. Portal vein is patent on color Doppler imaging with normal direction of blood flow towards the liver. Other: No RIGHT upper quadrant free fluid. IMPRESSION: Gallbladder distended by sludge. Probable fatty infiltration of liver as above. Electronically Signed   By: Lavonia Dana M.D.   On: 03/31/2021 13:00    Labs:  Basic Metabolic Panel: Recent Labs  Lab 04/24/21 0456 04/28/21 0546  NA 134* 135  K 4.1 4.0  CL 103 105  CO2 23 24  GLUCOSE 90 94  BUN 15 15  CREATININE 0.85 0.80  CALCIUM 9.3 8.9    CBC: Recent Labs  Lab 04/28/21 0546  WBC 5.1  NEUTROABS 3.9  HGB 10.9*  HCT 31.8*  MCV 91.4  PLT 270    CBG: No results for input(s): GLUCAP in the last 168 hours.  Family history.  Mother with breast cancer as well as sister.  Sister with diabetes.  Denies any colon cancer esophageal cancer or rectal cancer  Brief HPI:   [name] is a 60 y.o. right-handed female with history of hypertension hyperlipidemia depression, quit smoking 7 years ago.  Patient lives alone.  Independent prior to admission.  Presented to [facility] 03/26/2021 with recent fall 3 weeks ago after missing a step without loss of consciousness.  She  also reported some generalized weakness and lightheadedness and dizziness on  standing.  By report she had recently gone to the [place] urgent care about a week ago received Flexeril and ibuprofen.  Admission chemistries unremarkable except AST 161 ALT 114 troponin negative CK-638, lactic acid 1.8.  Cranial CT scan negative.  CT cervical spine unremarkable.  X-rays and pelvis no acute osseous injury.  Echocardiogram with ejection fraction of 60 to 65% no wall motion abnormalities grade 1 diastolic dysfunction.  Ultrasound the abdomen gallbladder distended by sludge no gross hepatic mass.  Work-up possible rheumatoid arthritis positive ANA negative CCP mildly elevated ESR 26 and CRP 2.5 as well as elevated RA(101.4).  Outpatient rheumatology Dr.ARyal curbside consulted recommend treatment with prednisone and taper.  CK improved to 26 as well as improved LFTs.  Subcutaneous Lovenox for DVT prophylaxis.  Therapy evaluations completed due to patient decreased functional mobility was admitted for a comprehensive rehab program.   Hospital Course: HORT[facility] CHARTER was admitted to rehab 04/08/2021 for inpatient therapies to consist of PT, ST and OT at least three hours five days a week. Past admission physiatrist, therapy team and rehab RN have worked together to provide customized collaborative inpatient rehab.  Pertaining to patient's debility related to generalized pain/rheumatoid arthritis she would follow-up outpatient rheumatology services prednisone taper as indicated.  Subcutaneous Lovenox for DVT prophylaxis and no bleeding episodes.  Pain managed with use of tramadol.  Mood stabilization with Wellbutrin as well as Lexapro adding Abilify 04/19/2021.  Initial attempts at Ritalin to help patient maintain focus but stopped on 11/18.  Blood pressure was soft and norvasc was stopped 11/7 and lisinopril was decreased  11/8 to off and monitored.  LFTs continue to improve and monitored.  She had been on Lipitor for hyperlipidemia held due to elevated LFTs.     Blood pressures were  monitored on TID basis and soft and monitored     Rehab course: During patient's stay in rehab weekly team conferences were held to monitor patient's progress, set goals and discuss barriers to discharge. At admission, patient required minimal assist 80 feet rolling walker minimal assist sit to stand  Physical exam.  Blood pressure 124/83 pulse 95 temperature 98 respiration 20 oxygen saturation 97% room air Constitutional.  No acute distress HEENT Head.  Normocephalic and atraumatic Eyes.  Pupils round and reactive to light no discharge without nystagmus Neck.  Supple nontender no JVD without thyromegaly Cardiac regular rate rhythm not extra sounds or murmur heard Abdomen.  Soft nontender positive bowel sounds without rebound Musculoskeletal Normal range of motion no rigidity Comments.  4+/5 in arms proximally and distally B/L Hip flexors 4 -/5 knee extension 4/5 dorsi/plantar flexion 4+/5B/L No swollen joints on exam Skin.  Warm and dry Neurologic alert mood was a bit flat no specific complaints.  Provides name and age.  Follows commands.  He/She  has had improvement in activity tolerance, balance, postural control as well as ability to compensate for deficits. He/She has had improvement in functional use RUE/LUE  and RLE/LLE as well as improvement in awareness.  Patient perform supine to sit from flat bed with max verbal cues for technique max assist for sit to stand throughout D/T retropulsion.  Amatory transfer into bathroom with max assist for balance.  Ambulating 25 feet with assistive device.  Supine to sit completed with increased time with ADLs.  Contact-guard minimal assist for amatory transfers to elevated toilet using rolling walker minimal assist to lower clothing and patient had  voided continently.  Contact-guard for balance during hygiene completion and standing.  Donned lower body clothing using dressing stick.  Ambulates into her room and sat down in the wheelchair.  Speech  therapy follow-up patient more alert during sessions increase sustained attention attending to task.  Required some cues to scan room to increase orientation concepts.  It was discussed with family need for supervision on discharge.  Family teaching completed and discharged to home       Disposition: Discharge to home    Diet: Regular  Special Instructions: No driving smoking or alcohol  Follow-up PCP on managing of hypertension with Norvasc and lisinopril on hold  Medications at discharge 1.  Tylenol as needed 2.  Abilify 5 mg p.o. nightly 3.  Wellbutrin 300 mg p.o. daily 4.  Vitamin D 1000 units p.o. daily 5.  Lexapro 20 mg p.o. daily 6.  Protonix 40 mg p.o. daily 7.  Senokot S1 tablet p.o. twice daily 8.  Tramadol 50 mg every 12 hours as needed moderate pain   30-35 minutes were spent completing discharge summary and discharge planning     Follow-up Information     Lovorn, Jinny Blossom, MD Follow up.   Specialty: Physical Medicine and Rehabilitation Why: No formal follow-up needed Contact information: [address] [place] [address] [phone]         Lahoma Rocker, MD Follow up.   Specialty: Rheumatology Why: Call for appointment Contact information: [address] [place] [address] [phone]                 Signed: Lavon Paganini Wainwright 04/30/2021, 5:09 AM

## 2021-04-28 NOTE — Progress Notes (Signed)
Physical Therapy Session Note  Patient Details  Name: Carrie [name] MRN: [id number] Date of Birth: [birth year]  Today's Date: 04/28/2021 PT Individual Time: 0800-0930 PT Individual Time Calculation (min): 90 min   Short Term Goals: Week 1:  PT Short Term Goal 1 (Week 1): Patient will complete bed mobility with CGA consistently PT Short Term Goal 2 (Week 1): Patient will transfer bed <> wc with LRAD and CGA consistently PT Short Term Goal 2 - Progress (Week 1): Not met PT Short Term Goal 3 (Week 1): Patient will ambulate >93ft with LRAD and CGA PT Short Term Goal 3 - Progress (Week 1): Not met Week 2:  PT Short Term Goal 1 (Week 2): Pt will ambulate >25 ft conistently PT Short Term Goal 1 - Progress (Week 2): Partly met PT Short Term Goal 2 (Week 2): Pt will propell w/c >50 ft PT Short Term Goal 2 - Progress (Week 2): Met PT Short Term Goal 3 (Week 2): Pt will initiate stair training PT Short Term Goal 3 - Progress (Week 2): Met  Skilled Therapeutic Interventions/Progress Updates:    pt received in bed and agreeable to therapy. No complaint of pain.  Pt continued to demonstrate confabulatory speech and poor problem solving and decision making throughout session. All tasks took and extended amount of d/t fatigue and poor initiation. Pt repeatedly cannot remember therapist's name, but does recognize role (therapist).  Supine>sit with mod A to facilitate movement and manage BLE. Scoot to EOB with max VC. Pt requested to use the bathroom. Sit to stand throughout session with mod A d/t retropulsion and poor safety awareness. During transfers, pt demoed extremely poor safety awareness when turning to sit, including attempting to sit when lined up with arm rest. When told she would have fallen, she stated, "I know but I had to sit down." Was not receptive to education for safe sitting. ambulatory transfer to bathroom with mod A throughout for balance and hygiene assist.   Pt ambulated in room  distance, back and forth from bed and chairs with mod A for balance and occ max A for safety. Pt picked out clothes from chair with extended time for decision making. Pt dressed with mod A. Difficulty sequencing, including doffing clothes before donning clean ones.   Pt propelled W/c w/BUE 75 ft in ~20 min with max VC throughout. No carry over of previous education. At this time, it is unlikely pt will be proficient with using manual w/c before planned d/c date and will require assistance for locomotion  Pt performed kinetron at 90 cm/sec 10 cycles x 4 bouts. Initially required assist to move pedals but working up to full ROM with time to process task and frequent rest breaks.  Returned to room and remained in w/c and was left with all needs in reach and alarm active. Pt was set up with breakfast and provided ensure at MD's request.  Therapy Documentation Precautions:  Precautions Precautions: Fall Precaution Comments: fatigues quickly Restrictions Weight Bearing Restrictions: No General:   Vital Signs: Therapy Vitals Temp: 97.8 F (36.6 C) Pulse Rate: 100 Resp: 18 BP: 119/78 Patient Position (if appropriate): Lying Oxygen Therapy SpO2: 97 % O2 Device: Room Air Pain:   Mobility:   Locomotion :    Trunk/Postural Assessment :    Balance:   Exercises:   Other Treatments:      Therapy/Group: Individual Therapy  HAGEN TIDD 04/28/2021, 9:11 AM

## 2021-04-28 NOTE — Progress Notes (Signed)
PROGRESS NOTE   Subjective/Complaints:  Pt hasn't eaten, says willing to take supplements- Throat hurting- Per PT,  pt confabulating this AM- significant.  Called PT Olivia Cortney???  ROS:  Limited by cognition   Objective:   No results found. Recent Labs    04/28/21 0546  WBC 5.1  HGB 10.9*  HCT 31.8*  PLT 270      Recent Labs    04/28/21 0546  NA 135  K 4.0  CL 105  CO2 24  GLUCOSE 94  BUN 15  CREATININE 0.80  CALCIUM 8.9      Intake/Output Summary (Last 24 hours) at 04/28/2021 1802 Last data filed at 04/28/2021 1300 Gross per 24 hour  Intake 240 ml  Output --  Net 240 ml        Physical Exam: Vital Signs Blood pressure 122/89, pulse (!) 106, temperature 98.3 F (36.8 C), resp. rate 18, height 5\' 5"  (1.651 m), weight 71.1 kg, SpO2 97 %.   General: awake, alert, appropriate, sitting EOB; breakfast not touched; NAD HENT: conjugate gaze; oropharynx moist- trace erythema at oropharynx  CV: regular rate this AM; no JVD Pulmonary: CTA B/L; no W/R/R- good air movement GI: soft, NT, ND, (+)BS Psychiatric: appropriate Neurological: less appropriate/alert- called PT with same name Courtney Skin: Warm and dry.  Intact. Musc: No edema in extremities.  No tenderness in extremities. Motor: 4+/5 in arms proximal and distally B/L HF 4/5; KE 4/5 DF/PF 4+/5 B/L   Assessment/Plan: 1. Functional deficits which require 3+ hours per day of interdisciplinary therapy in a comprehensive inpatient rehab setting. Physiatrist is providing close team supervision and 24 hour management of active medical problems listed below. Physiatrist and rehab team continue to assess barriers to discharge/monitor patient progress toward functional and medical goals  Care Tool:  Bathing    Body parts bathed by patient: Right arm, Left arm, Chest, Abdomen, Face, Right upper leg, Left upper leg, Front perineal area   Body  parts bathed by helper: Right lower leg, Left lower leg, Buttocks     Bathing assist Assist Level: Moderate Assistance - Patient 50 - 74%     Upper Body Dressing/Undressing Upper body dressing   What is the patient wearing?: Pull over shirt    Upper body assist Assist Level: Minimal Assistance - Patient > 75%    Lower Body Dressing/Undressing Lower body dressing      What is the patient wearing?: Pants, Underwear/pull up     Lower body assist Assist for lower body dressing: Maximal Assistance - Patient 25 - 49%     Toileting Toileting    Toileting assist Assist for toileting: Moderate Assistance - Patient 50 - 74%     Transfers Chair/bed transfer  Transfers assist     Chair/bed transfer assist level: Moderate Assistance - Patient 50 - 74%     Locomotion Ambulation   Ambulation assist      Assist level: Moderate Assistance - Patient 50 - 74% Assistive device: Walker-rolling Max distance: 4 ft   Walk 10 feet activity   Assist     Assist level: Minimal Assistance - Patient > 75% Assistive device: Walker-rolling   Walk 50  feet activity   Assist Walk 50 feet with 2 turns activity did not occur: Safety/medical concerns  Assist level: Minimal Assistance - Patient > 75% Assistive device: Walker-rolling    Walk 150 feet activity   Assist Walk 150 feet activity did not occur: Safety/medical concerns         Walk 10 feet on uneven surface  activity   Assist Walk 10 feet on uneven surfaces activity did not occur: Safety/medical concerns         Wheelchair     Assist Is the patient using a wheelchair?: Yes Type of Wheelchair: Manual    Wheelchair assist level: Total Assistance - Patient < 25% Max wheelchair distance: 150    Wheelchair 50 feet with 2 turns activity    Assist        Assist Level: Total Assistance - Patient < 25%   Wheelchair 150 feet activity     Assist      Assist Level: Total Assistance -  Patient < 25%   Blood pressure 122/89, pulse (!) 106, temperature 98.3 F (36.8 C), resp. rate 18, height 5\' 5"  (1.651 m), weight 71.1 kg, SpO2 97 %.    Medical Problem List and Plan: 1.  Debility secondary to generalized pain/rheumatoid arthritis.  Follow-up outpatient rheumatology services Dr .  Prednisone taper as indicated  Con't PT, OT and SLP 2.  Antithrombotics: -DVT/anticoagulation:  Pharmaceutical: Lovenox             -antiplatelet therapy: n/a 3. Pain Management: Tramadol as needed  11/6- on stable prednisone dose- pain controlled- pt has moon facies as side effect, reduced to 5mg    Controlled with meds on 11/19 4. Mood: Wellbutrin 300 mg daily, Lexapro 20 mg daily  11/12- added Abilify 5 mg QHS - con't regimen  Ritalin stopped on 11/18, monitor for changes  11/21- still depressed- but will check Ammonia due to confabulation per PT             -antipsychotic agents: N/A 5. Neuropsych: This patient is? capable of making decisions on her own behalf. 6. Skin/Wound Care: Routine skin checks 7. Fluids/Electrolytes/Nutrition: Routine in and outs 8.  Hypertension.  Norvasc, lisinopril daily.  Monitor with increased mobility   Vitals:   04/28/21 0647 04/28/21 1350  BP: 119/78 122/89  Pulse: 100 (!) 106  Resp: 18 18  Temp: 97.8 F (36.6 C) 98.3 F (36.8 C)  SpO2: 97% 97%    11/21- controlled- con't regimen 9.  Elevated LFTs/transaminitis- AST mildly elevated on 11/17, continue to monitor 10.  Hyperlipidemia.  Lipitor held due to elevated LFTs 11. Poor energy vit D level mildly reduced will supplement 1000IU daily  12. Hx of depression  11/3- on wellbutrin and Lexapro- high doses- will con't  11/10- sister thinks she's bipolar-started Abilify 5 mg QHS as adjuvant  11/11- MRI (-) for stroke, etc  13. Moderate to severe malnutrition  dietician consult 14.  Mild hyponatremia  Sodium 134 on 11/17, continue to monitor 15. Confabulation?  11/21- will check Ammonia in  AM ot make sure that's not reason 16.Sore throat- no thrush- 11/21-  Nystatin swish and swallow   LOS: 20 days A FACE TO FACE EVALUATION WAS PERFORMED  Carrie [name] 04/28/2021, 6:02 PM

## 2021-04-28 NOTE — Progress Notes (Signed)
Inpatient Rehabilitation Care Coordinator Discharge Note   Patient Details  Name: Carrie [name] MRN: [id number] Date of Birth: 1961-01-02   Discharge location: HOME WITH FAMILY COMING IN AND OUT-ALONE AT NIGHT TIME ONCE IN BED  Length of Stay: 22 days   Discharge activity level: SUPERVISION-CGA-NEEDS CUEING  Home/community participation: ACTIVE  Patient response IZ:TIWPYK Literacy - How often do you need to have someone help you when you read instructions, pamphlets, or other written material from your doctor or pharmacy?: Rarely  Patient response DX:IPJASN Isolation - How often do you feel lonely or isolated from those around you?: Rarely  Services provided included: MD, RD, PT, OT, SLP, RN, CM, TR, Pharmacy, Neuropsych, SW  Financial Services:  Field seismologist Utilized: Private Insurance FED [employer]  Choices offered to/list presented to: PT AND SISTER  Follow-up services arranged:  Home Health, DME, Patient/Family has no preference for HH/DME agencies Home Health Agency: [facility]-PT & OT    DME : [facility] HAS RESOURCES FOR PRIVATE DUTY AND MAY NEED TO PURSUE ALF. TO FOLLOW UP WITH NEUROLOGY FOR TESTING AND DIAGNOSIS    Patient response to transportation need: Is the patient able to respond to transportation needs?: Yes In the past 12 months, has lack of transportation kept you from medical appointments or from getting medications?: No In the past 12 months, has lack of transportation kept you from meetings, work, or from getting things needed for daily living?: No    Comments (or additional information): SISTER HAS BEEN HERE AND OBSERVED IN THERAPIES. PT CAN DO TAKES TIME AND NEEDS TO BE PUSHED, WOULD RATHER LAY IN BED.   Patient/Family verbalized understanding of follow-up arrangements:  Yes  Individual responsible for coordination of the follow-up plan: SANDRA-SISTER  [phone]  Confirmed correct DME delivered: Lucy Chris 04/28/2021     Nadezhda Pollitt, Lemar Livings

## 2021-04-29 DIAGNOSIS — R5381 Other malaise: Secondary | ICD-10-CM | POA: Diagnosis not present

## 2021-04-29 LAB — AMMONIA: Ammonia: 11 umol/L (ref 9–35)

## 2021-04-29 MED ORDER — PANTOPRAZOLE SODIUM 40 MG PO TBEC: 40.0000 mg | DELAYED_RELEASE_TABLET | Freq: Every day | ORAL | 3 refills | Status: DC

## 2021-04-29 MED ORDER — ESCITALOPRAM OXALATE 20 MG PO TABS: 20.0000 mg | ORAL_TABLET | Freq: Every day | ORAL | 2 refills | Status: DC

## 2021-04-29 MED ORDER — BUPROPION HCL ER (XL) 300 MG PO TB24: 300.0000 mg | ORAL_TABLET | Freq: Every day | ORAL | 0 refills | Status: DC

## 2021-04-29 MED ORDER — VITAMIN D3 25 MCG PO TABS: 1000.0000 [IU] | ORAL_TABLET | Freq: Every day | ORAL | 0 refills | Status: DC

## 2021-04-29 MED ORDER — ACETAMINOPHEN 325 MG PO TABS: 650.0000 mg | ORAL_TABLET | Freq: Four times a day (QID) | ORAL | Status: AC | PRN

## 2021-04-29 MED ORDER — ARIPIPRAZOLE 5 MG PO TABS: 5.0000 mg | ORAL_TABLET | Freq: Every day | ORAL | 0 refills | Status: DC

## 2021-04-29 MED ORDER — SENNOSIDES-DOCUSATE SODIUM 8.6-50 MG PO TABS: 1.0000 | ORAL_TABLET | Freq: Two times a day (BID) | ORAL | Status: AC

## 2021-04-29 MED ORDER — TRAMADOL HCL 50 MG PO TABS: 50.0000 mg | ORAL_TABLET | Freq: Two times a day (BID) | ORAL | 0 refills | Status: DC | PRN

## 2021-04-29 NOTE — Progress Notes (Signed)
PROGRESS NOTE   Subjective/Complaints:  Pt ate all frosted flakes this AM and 1/2 supplement.  Pt's ammonia is 11- which is good.  Pt says is "done" doing therapy- however explained she will decline in function if doesn't get up at home-.   Still needing significant assistance, but to show off, pt did supine to sit on her own with hospital bed this AM- mod I.   ROS:  Limited by cognition   Objective:   No results found. Recent Labs    04/28/21 0546  WBC 5.1  HGB 10.9*  HCT 31.8*  PLT 270      Recent Labs    04/28/21 0546  NA 135  K 4.0  CL 105  CO2 24  GLUCOSE 94  BUN 15  CREATININE 0.80  CALCIUM 8.9      Intake/Output Summary (Last 24 hours) at 04/29/2021 0920 Last data filed at 04/29/2021 0700 Gross per 24 hour  Intake 360 ml  Output 578 ml  Net -218 ml        Physical Exam: Vital Signs Blood pressure 112/76, pulse (!) 110, temperature 97.9 F (36.6 C), temperature source Oral, resp. rate 16, height 5\' 5"  (1.651 m), weight 71.1 kg, SpO2 100 %.    General: awake, alert, appropriate, NAD HENT: conjugate gaze; oropharynx moist CV: regular rhythm; tachycardic rate; no JVD Pulmonary: CTA B/L; no W/R/R- good air movement GI: soft, NT, ND, (+)BS Psychiatric: a little confused; but less so today Neurological: alert; confused Skin: Warm and dry.  Intact. Musc: No edema in extremities.  No tenderness in extremities. Motor: 4+/5 in arms proximal and distally B/L HF 4/5; KE 4/5 DF/PF 4+/5 B/L   Assessment/Plan: 1. Functional deficits which require 3+ hours per day of interdisciplinary therapy in a comprehensive inpatient rehab setting. Physiatrist is providing close team supervision and 24 hour management of active medical problems listed below. Physiatrist and rehab team continue to assess barriers to discharge/monitor patient progress toward functional and medical goals  Care  Tool:  Bathing    Body parts bathed by patient: Right arm, Left arm, Chest, Abdomen, Face, Right upper leg, Left upper leg, Front perineal area   Body parts bathed by helper: Right lower leg, Left lower leg, Buttocks     Bathing assist Assist Level: Moderate Assistance - Patient 50 - 74%     Upper Body Dressing/Undressing Upper body dressing   What is the patient wearing?: Pull over shirt    Upper body assist Assist Level: Minimal Assistance - Patient > 75%    Lower Body Dressing/Undressing Lower body dressing      What is the patient wearing?: Pants, Underwear/pull up     Lower body assist Assist for lower body dressing: Maximal Assistance - Patient 25 - 49%     Toileting Toileting    Toileting assist Assist for toileting: Moderate Assistance - Patient 50 - 74%     Transfers Chair/bed transfer  Transfers assist     Chair/bed transfer assist level: Moderate Assistance - Patient 50 - 74%     Locomotion Ambulation   Ambulation assist   Ambulation activity did not occur: Refused  Assist level: Moderate Assistance -  Patient 50 - 74% Assistive device: Walker-rolling Max distance: 4 ft   Walk 10 feet activity   Assist  Walk 10 feet activity did not occur: Refused  Assist level: Moderate Assistance - Patient - 50 - 74% Assistive device: Walker-rolling   Walk 50 feet activity   Assist Walk 50 feet with 2 turns activity did not occur: Refused  Assist level: Minimal Assistance - Patient > 75% Assistive device: Walker-rolling    Walk 150 feet activity   Assist Walk 150 feet activity did not occur: Refused         Walk 10 feet on uneven surface  activity   Assist Walk 10 feet on uneven surfaces activity did not occur: Safety/medical concerns         Wheelchair     Assist Is the patient using a wheelchair?: Yes Type of Wheelchair: Manual    Wheelchair assist level: Maximal Assistance - Patient 25 - 49% Max wheelchair distance:  50 (extreme incr time, not functional)    Wheelchair 50 feet with 2 turns activity    Assist        Assist Level: Maximal Assistance - Patient 25 - 49%   Wheelchair 150 feet activity     Assist      Assist Level: Total Assistance - Patient < 25%   Blood pressure 112/76, pulse (!) 110, temperature 97.9 F (36.6 C), temperature source Oral, resp. rate 16, height 5\' 5"  (1.651 m), weight 71.1 kg, SpO2 100 %.    Medical Problem List and Plan: 1.  Debility secondary to generalized pain/rheumatoid arthritis.  Follow-up outpatient rheumatology services Dr .  Prednisone taper as indicated  Continue CIR- PT, OT and SLP- team conference today to finalize d/c.  2.  Antithrombotics: -DVT/anticoagulation:  Pharmaceutical: Lovenox             -antiplatelet therapy: n/a 3. Pain Management: Tramadol as needed  11/6- on stable prednisone dose- pain controlled- pt has moon facies as side effect, reduced to 5mg    Controlled with meds on 11/19  11/22- usually pain controlled, con't regimen 4. Mood: Wellbutrin 300 mg daily, Lexapro 20 mg daily  11/12- added Abilify 5 mg QHS - con't regimen  Ritalin stopped on 11/18, monitor for changes  11/21- still depressed- but will check Ammonia due to confabulation per PT             -antipsychotic agents: N/A 5. Neuropsych: This patient is? capable of making decisions on her own behalf. 6. Skin/Wound Care: Routine skin checks 7. Fluids/Electrolytes/Nutrition: Routine in and outs 8.  Hypertension.  Norvasc, lisinopril daily.  Monitor with increased mobility   Vitals:   04/28/21 2021 04/29/21 0652  BP: 112/80 112/76  Pulse: 100 (!) 110  Resp: 20 16  Temp: 98 F (36.7 C) 97.9 F (36.6 C)  SpO2: 100% 100%    11/21- controlled- con't regimen 9.  Elevated LFTs/transaminitis- AST mildly elevated on 11/17, continue to monitor 10.  Hyperlipidemia.  Lipitor held due to elevated LFTs 11. Poor energy vit D level mildly reduced will supplement  1000IU daily  12. Hx of depression  11/3- on wellbutrin and Lexapro- high doses- will con't  11/10- sister thinks she's bipolar-started Abilify 5 mg QHS as adjuvant  11/11- MRI (-) for stroke, etc   11/22- Ammonia is 11- so not the cause of confusion- I think it's the depression that's the cause- cannot find any other contributing factors.  13. Moderate to severe malnutrition  dietician consult 14.  Mild hyponatremia  Sodium 134 on 11/17, continue to monitor 15. Confabulation?  11/21- will check Ammonia in AM ot make sure that's not reason 16.Sore throat- no thrush- 11/21-  Nystatin swish and swallow   LOS: 21 days A FACE TO FACE EVALUATION WAS PERFORMED  Slaton Reaser 04/29/2021, 9:20 AM

## 2021-04-29 NOTE — Progress Notes (Signed)
Speech Language Pathology Discharge Summary  Patient Details  Name: Carrie [name] MRN: [id number] Date of Birth: 03-27-1961  Today's Date: 04/29/2021 SLP Individual Time: 1415-1500 SLP Individual Time Calculation (min): 45 min   Skilled Therapeutic Interventions: Skilled treatment session focused on cognitive goals. Upon arrival, patient was awake in bed but appeared lethargic. Patient was re-administered the Cognistat to compare scores from admission. Patient's scores have declined in all areas with the exception of repetition of numbers, verbal repetition and auditory comprehension. Mild deficits were noted in attention and naming with semantic paraphasias. Moderate deficits noted in calculations and reasoning and severe deficits in memory registration, short-term recall, visual construction and judgement. Patient with intermittent language of confusion throughout. Patient left upright in bed with alarm on and all needs within reach.   Patient has met 0 of 4 long term goals.  Patient to discharge at overall Mod;Max level.   Reasons goals not met: Patient's overall cognitive functioning has declined since admission. Patient requires overall mod-max A multimodal cues to complete functional and familiar tasks safely in regards to attention, problem solving, recall and awareness. Family and physician are aware of cognitive decline and have set up appropriate referrals and f/u as needed.   Clinical Impression/Discharge Summary: Patient has had a cognitive decline since admission and has not met any LTGs this admission. Currently, patient requires overall mod-max A multimodal cues to complete functional and familiar tasks safely in regards to sustained attention, recall with use of external aids, emergent awareness and functional problem solving. Patient demonstrates delayed processing with language of confusion and intermittent semantic paraphasias. Family education is complete and patient will  discharge home with 24 hour supervision. Family and physician are aware of cognitive decline and have set up appropriate referrals and f/u as needed. Patient would benefit from f/u SLP services to maximize her cognitive-linguistic functioning in order to reduce caregiver burden.   Care Partner:  Yes Type of Caregiver Assistance: Physical;Cognitive  Recommendation:  24 hour supervision/assistance;Home Health SLP  Rationale for SLP Follow Up: Reduce caregiver burden;Maximize cognitive function and independence;Maximize functional communication   Equipment: N/A   Reasons for discharge: Discharged from [place] 04/29/2021, 3:15 PM

## 2021-04-29 NOTE — Progress Notes (Signed)
Removed non pressure wound from EMR. Area is smooth and is skin discoloration.

## 2021-04-29 NOTE — Progress Notes (Signed)
Occupational Therapy Session Note  Patient Details  Name: Carrie [name] MRN: [id number] Date of Birth: 10/28/1960  Today's Date: 04/29/2021 OT Individual Time: 1030-1055 and 1300-1409 OT Individual Time Calculation (min): 25 min and 69 min   Short Term Goals: Week 2:  OT Short Term Goal 1 (Week 2): Pt will perform shower level bathing with no more than Min A and AE PRN OT Short Term Goal 1 - Progress (Week 2): Not met OT Short Term Goal 2 (Week 2): Pt will ambulate to/from bathroom with LRAD Supervision OT Short Term Goal 2 - Progress (Week 2): Not met OT Short Term Goal 3 (Week 2): Pt will utilize energy conservation techniques during ADL with no more than Min verbal cues OT Short Term Goal 3 - Progress (Week 2): Not met Week 3:  OT Short Term Goal 1 (Week 3): STGs = LTGs 2/2 ELOS   Skilled Therapeutic Interventions/Progress Updates:    Session 1: Pt greeted at time of session supine in bed resting with TEDS already on and sister [name] present who remained throughout session. Focus of session on on family/patient education with reviewing CLOF with bathing, dressing, and toileting. Sister politely declining formal training during OT session stating she had training with PT during that session. Verbally reviewed and demonstrated ADL skill performance with sister. Sister verbalizing that she did not need further training as she has been a Designer, multimedia. Demonstrated use of setting up [facility] at home with sister as well. Pt supine > sit Min A and stand pivot same manner to wheelchair with RW. Set up alarm on call bell in reach.   Session 2: Pt greeted at time of session sitting up in wheelchair, fatigued but willing to attempt OT session. No pain at rest but does have some arthritic pain with movement and rest. Transported to sink and performed UB/LB bathing at sink level for reassessment for grad day, needing Min A overall for BLE past knee level and standing balance for buttocks/periarea. Sit <> stands at  sink for support with Mod A. UB dress Mod A for assist getting arms/head in appropriate holes and Mod/Max A for LB dressing to thread feet and pull over hips as pt had on tight underwear and leggings. Note very extended time needed for all bathing and dressing tasks encouraging pt to problem solve and sequence, eventually needing Mod multimodal cues to complete ADL tasks. Stand pivot wheelchair > bed with Mod A for power up and Min A for transfer with RW, posterior lean present. In bed resting alarm on call bell in reach.   Therapy Documentation Precautions:  Precautions Precautions: Fall Precaution Comments: fatigues quickly Restrictions Weight Bearing Restrictions: No     Therapy/Group: Individual Therapy  Viona Gilmore 04/29/2021, 7:25 AM

## 2021-04-29 NOTE — Progress Notes (Signed)
Physical Therapy Discharge Summary  Patient Details  Name: Carrie [name] MRN: [id number] Date of Birth: 18-Nov-1960  Today's Date: 04/29/2021 PT Individual Time: 0800-0911 PT Individual Time Calculation (min): 71 min    Patient has met 1 of 10 long term goals due to mild, inconsistent improvements in compensation for deficits.  Patient to discharge at a wheelchair level [place].   Patient's care partner unavailable to provide the necessary physical and cognitive assistance at discharge. Per pt's family, pt to d/c home alone because no assistance is available. Pt's sisters have been advised that pt continues to need physical and cognitive assist for all mobility. Pt requires mod-max A for cognitive tasks and safety awareness, occ CGA supervision mobility but unsafe.    Reasons goals not met: Physical and cognitive decline, limited participation with therapy.  Recommendation:  Patient will benefit from ongoing skilled PT services in home health setting to continue to advance safe functional mobility, address ongoing impairments in safety awareness, balance, strength, mobility, and minimize fall risk.  Equipment: RW and w/c  Reasons for discharge: discharge from hospital  Patient/family agrees with progress made and goals achieved: Yes  Skilled Therapeutic Interventions/Progress Updates:  Pt received in bed eating breakfast. Pt continued to complete meal while therapist performed d/c subjective. Strength and sensation measures performed as documented above and below. Pt required incr time and rest breaks throughout. Supine>sit with supervision, d/t pt becoming frustrated with therapist and MD in room. Typically, pt requires min-mod A for bed mobility. Stand pivot transfer to w/c with min A and RW for balance and safety awareness. Pt transported to therapy gym for time management and energy conservation. Car transfer with mod A, pt becoming increasingly frustrated with therapist at this time  and refused any further mobility. Pt remained in w/c at end of session and was left with all needs in reach and alarm active.   PT Discharge Precautions/Restrictions Precautions Precautions: Fall Precaution Comments: fatigues quickly Restrictions Weight Bearing Restrictions: No Vital Signs Therapy Vitals Temp: 97.9 F (36.6 C) Temp Source: Oral Pulse Rate: (!) 110 Resp: 16 BP: 112/76 Patient Position (if appropriate): Sitting Oxygen Therapy SpO2: 100 % O2 Device: Room Air Pain   Pain Interference Pain Interference Pain Effect on Sleep: 1. Rarely or not at all Pain Interference with Therapy Activities: 1. Rarely or not at all Pain Interference with Day-to-Day Activities: 1. Rarely or not at all Vision/Perception  Perception Perception: Within Functional Limits Praxis Praxis: Intact  Cognition Overall Cognitive Status: Impaired/Different from baseline Arousal/Alertness: Lethargic Orientation Level: Oriented to place;Disoriented to person;Other (comment);Oriented to situation;Disoriented to time (oriented to time with visual cues, oriented to self, knows staff role but not name) Year: Other (Comment) (could not recall even with max cues to use calendar) Month: November Day of Week: Other (Comment) (initially incorrect, but corrected with use of calendar) Attention: Sustained;Selective Sustained Attention: Impaired Sustained Attention Impairment: Verbal basic;Functional basic Selective Attention: Impaired Selective Attention Impairment: Verbal basic;Functional basic Memory: Impaired Memory Impairment: Storage deficit;Retrieval deficit Awareness Impairment: Anticipatory impairment;Emergent impairment Problem Solving: Impaired Problem Solving Impairment: Verbal complex;Functional basic Executive Function: Reasoning;Self Monitoring;Sequencing;Decision Making Reasoning: Impaired Reasoning Impairment: Verbal complex Sequencing: Impaired Decision Making: Impaired Self  Monitoring: Impaired Self Monitoring Impairment: Verbal complex Safety/Judgment: Impaired Comments: Requires constant cueing and occ physical assist to prevent falls d/t impaired safety awareness. Sensation Sensation Light Touch: Appears Intact Hot/Cold: Not tested Proprioception: Appears Intact Stereognosis: Appears Intact Coordination Gross Motor Movements are Fluid and Coordinated: No Coordination and Movement Description:  Continued decline in coordination with cognitive decline Motor  Motor Motor: Abnormal postural alignment and control;Other (comment) Motor - Skilled Clinical Observations: generalized weakness, stiffness, mild B dysmetria Motor - Discharge Observations: Worsened generalized weakness and coordination d/t physical decline and limited participation in therapy.  Mobility Bed Mobility Bed Mobility: Supine to Sit;Sit to Supine Rolling Right: Contact Guard/Touching assist Rolling Left: Contact Guard/Touching assist Supine to Sit: Moderate Assistance - Patient 50-74% Sit to Supine: Moderate Assistance - Patient 50-74% Transfers Sit to Stand: Moderate Assistance - Patient 50-74% Stand to Sit: Maximal Assistance - Patient 25-49% Stand Pivot Transfers: Moderate Assistance - Patient 50 - 74% Stand Pivot Transfer Details: Verbal cues for safe use of DME/AE;Verbal cues for precautions/safety;Visual cues for safe use of DME/AE;Tactile cues for placement;Visual cues/gestures for precautions/safety;Manual facilitation for weight shifting;Verbal cues for gait pattern;Verbal cues for technique;Visual cues/gestures for sequencing;Verbal cues for sequencing Stand Pivot Transfer Details (indicate cue type and reason): Pt needs frequent cueing for safety with transfers and AD use Transfer (Assistive device): Rolling walker Locomotion  Gait Ambulation: Yes Gait Assistance: Moderate Assistance - Patient 50-74% (walked as much as 92 ft with min A before experiencing physical and  cogntive decline) Gait Distance (Feet): 8 Feet Assistive device: Rolling walker Gait Assistance Details: Verbal cues for precautions/safety;Verbal cues for safe use of DME/AE;Verbal cues for sequencing;Visual cues/gestures for precautions/safety;Tactile cues for sequencing;Tactile cues for placement;Verbal cues for gait pattern;Verbal cues for technique;Visual cues/gestures for sequencing;Visual cues for safe use of DME/AE;Manual facilitation for placement Gait Assistance Details: Freq cueing for carryover Gait Gait: Yes Gait Pattern: Impaired Gait Pattern: Left foot flat;Right foot flat;Decreased step length - right;Decreased step length - left;Poor foot clearance - left;Poor foot clearance - right;Shuffle Gait velocity: decreased Stairs / Additional Locomotion Stairs: No Architect: Yes Wheelchair Assistance: Chartered loss adjuster: Both upper extremities Wheelchair Parts Management: Needs assistance Distance: 50 ft, but with max VC for technique with no carryover of education and taking ~15-20 min. Not functional or independent.  Trunk/Postural Assessment  Cervical Assessment Cervical Assessment: Within Functional Limits Thoracic Assessment Thoracic Assessment: Within Functional Limits Lumbar Assessment Lumbar Assessment: Within Functional Limits Postural Control Postural Control: Deficits on evaluation Righting Reactions: delayed and inadequate Protective Responses: delayed and inadequate  Balance Balance Balance Assessed: Yes Static Sitting Balance Static Sitting - Balance Support: Feet supported Static Sitting - Level of Assistance: 5: Stand by assistance Dynamic Sitting Balance Dynamic Sitting - Balance Support: During functional activity;Feet supported Dynamic Sitting - Level of Assistance: 5: Stand by assistance Static Standing Balance Static Standing - Balance Support: Bilateral upper extremity supported Static  Standing - Level of Assistance: 3: Mod assist Dynamic Standing Balance Dynamic Standing - Balance Support: Bilateral upper extremity supported;During functional activity Dynamic Standing - Level of Assistance: 2: Max assist Extremity Assessment      RLE Assessment RLE Assessment: Exceptions to [facility] RLE Strength Right Hip Flexion: 3-/5 Right Knee Flexion: 3-/5 Right Knee Extension: 3-/5 Right Ankle Dorsiflexion: 3-/5 Right Ankle Plantar Flexion: 3-/5 LLE Assessment LLE Assessment: Exceptions to [facility] LLE Strength Left Hip Flexion: 3-/5 Left Knee Flexion: 3-/5 Left Knee Extension: 3-/5 Left Ankle Dorsiflexion: 3-/5 Left Ankle Plantar Flexion: 3-/5    Belenda Cruise Nicle Connole 04/29/2021, 8:41 AM

## 2021-04-29 NOTE — Progress Notes (Signed)
Inpatient Rehabilitation Discharge Medication Review by a Pharmacist  A complete drug regimen review was completed for this patient to identify any potential clinically significant medication issues.  High Risk Drug Classes Is patient taking? Indication by Medication  Antipsychotic Yes Abilify for bipolar  Anticoagulant No   Antibiotic No   Opioid Yes Tramadol for pain  Antiplatelet No   Hypoglycemics/insulin No   Vasoactive Medication No   Chemotherapy No   Other Yes Lexapro, Wellbutrin for depression     Type of Medication Issue Identified Description of Issue Recommendation(s)  Drug Interaction(s) (clinically significant)     Duplicate Therapy     Allergy     No Medication Administration End Date     Incorrect Dose     Additional Drug Therapy Needed     Significant med changes from prior encounter (inform family/care partners about these prior to discharge).    Other       Clinically significant medication issues were identified that warrant physician communication and completion of prescribed/recommended actions by midnight of the next day:  No  Pharmacist comments: None  Time spent performing this drug regimen review (minutes):  20 minutes   Elwin Sleight 04/29/2021 9:22 AM

## 2021-04-29 NOTE — Progress Notes (Signed)
Nutrition Follow-up  DOCUMENTATION CODES:   Not applicable  INTERVENTION:  Continue Ensure Enlive po TID, each supplement provides 350 kcal and 20 grams of protein   Continue 30 ml Prosource Plus po BID, each supplement provides 100 kcal and 15 grams of protein.    Encourage PO intake.   NUTRITION DIAGNOSIS:   Inadequate oral intake related to poor appetite as evidenced by per patient/family report; progressed  GOAL:   Patient will meet greater than or equal to 90% of their needs; progressing  MONITOR:   PO intake, Supplement acceptance, Labs, Weight trends, Skin, I & O's  REASON FOR ASSESSMENT:   Consult Assessment of nutrition requirement/status  ASSESSMENT:   60 year old right-handed female with history of hypertension, hyperlipidemia initially presents after fall, generalized weakness and lightheadedness and dizziness. Rheumatoid arthritis positive. Therapy evaluations completed due to patient decreased functional mobility was admitted for a comprehensive rehab program.  Meal completion has been 25-100%. Pt currently has Prosource plus and Ensure ordered and has been consuming them. RD to continue with current orders to aid in caloric and protein needs. Plans for discharge tomorrow.   Labs and medications reviewed.   Diet Order:   Diet Order             Diet regular Room service appropriate? Yes; Fluid consistency: Thin  Diet effective now                   EDUCATION NEEDS:   Not appropriate for education at this time  Skin:  Skin Assessment: Reviewed RN Assessment Skin Integrity Issues:: Other (Comment) Other: non-pressure wound to buttocks  Last BM:  11/22  Height:   Ht Readings from Last 1 Encounters:  04/08/21 5\' 5"  (1.651 m)    Weight:   Wt Readings from Last 1 Encounters:  04/23/21 71.1 kg    BMI:  Body mass index is 26.08 kg/m.  Estimated Nutritional Needs:   Kcal:  1800-2000  Protein:  85-100 grams  Fluid:  >/= 1.8  L/day   04/25/21, MS, RD, LDN RD pager number/after hours weekend pager number on Amion.

## 2021-04-29 NOTE — Progress Notes (Addendum)
Patient ID: Carrie [name], female   DOB: May 17, 1961, 60 y.o.   MRN: [id number]  Met with Carrie [name] who voiced pt is worse and she is concerned about getting her into the home and her care. She feels she has declined cognitively since admission. She is aware pt is able to move but needs cueing and direction. Has all equipment 3 in 1 in room to take home. Will ask [facility] to contact sister to set up follow up appointments and add SW due to not sure how pt will do at home. Family may need to pursue ALF and hired assist for her. Carrie [name] wants to see how does at home upon discharge. Pt says ready to go home tomorrow.  1:44 PM Met with pt regarding team conference and she feels ready to go home tomorrow and remembers this. She feels sister is too concerned, but she needs to realize she needs care at home. Family will need to discuss and decide safest place for pt once home and if she improves there or is the same as she has been in the hospital.

## 2021-04-29 NOTE — Patient Care Conference (Signed)
Inpatient RehabilitationTeam Conference and Plan of Care Update Date: 04/29/2021   Time: 11:50 AM   Patient Name: Carrie [name]      Medical Record Number: [id number]  Date of Birth: Sep 08, 1960 Sex: Female         Room/Bed: 4W24C/4W24C-01 Payor Info: Payor: [employer] / Plan: BC[employer] PPO / Product Type: *No Product type* /    Admit Date/Time:  04/08/2021 11:44 AM  Primary Diagnosis:  Debility  Hospital Problems: Principal Problem:   Debility Active Problems:   Rheumatoid arthritis flare (HCC)   Fall (on) (from) other stairs and steps, subsequent encounter   Confusion   Hyponatremia   Depression    Expected Discharge Date: Expected Discharge Date: 04/30/21  Team Members Present: Physician leading conference: Dr. Genice Rouge Social Worker Present: Dossie Der, LCSW Nurse Present: Kennyth Arnold, RN PT Present: Bernie Covey, PT OT Present: Earleen Newport, OT SLP Present: Eilene Ghazi, SLP PPS Coordinator present : Fae Pippin, SLP     Current Status/Progress Goal Weekly Team Focus  Bowel/Bladder   Patient is continent of bladder/bowel with LBM recorded 04/25/21, prn laxatives order but refused for now, encouraging po intake  Remain continent.  QS/PRN assess and encouage to take prn medicines   Swallow/Nutrition/ Hydration             ADL's   Min/Mod LB, posterior lean, fatigues very quickly, Min/Mod sit <> stands, Min A stand pivot and short distance mobility, cognition and fatigue limiting  downgraded to CGA/Min  NA grad day, home with family   Mobility   mod A gait up to 8 ft with RW and max VC for safety. mod A transfers, Continued physical and cognitive decline  CGA - min A, unlikely to meet (already downgraded)  endurance, functional mobility, gait   Communication             Safety/Cognition/ Behavioral Observations  mod A  supervision  external aids for memory, attention, problem solving, safety awareness   Pain   Patient denies  pain or discmfort  Remain pain free.  Assess QS and PRN   Skin   Left buttock non pressure injury area inproved, skin intact  Prevent new skin breakdowns.  Assess QS/PRN     Discharge Planning:  Plan is for home have no SNF option and pt refuses-family to do the best they can and be there but alone at night once in bed.   Team Discussion: Declined cognitively since admission. Had scheduled neuro appointment however, was inpatient. Reschedule neuro appointment. Complained of sore throat, ordered Nystatin oral. Experiencing hallucinations. Sat at EOB mod I today. Sister is a former Archivist and has good knowledge of care. Only needed cues cognitively at admission, now mod/max.  Patient on target to meet rehab goals: Yes, Downgraded goals to contact guard/min assist with OT, contact guard/supervision with PT.  *See Care Plan and progress notes for long and short-term goals.   Revisions to Treatment Plan:  Adjusting medications  Teaching Needs: Family education, medication management, skin/wound care, safety awareness, cognition awareness, transfer training, etc.   Current Barriers to Discharge: Decreased caregiver support, Home enviroment access/layout, Wound care, Weight, Medication compliance, Behavior, and Nutritional means  Possible Resolutions to Barriers: Family education Follow up Neuro appointment Follow up PT/OT/SLP Recommended DME     Medical Summary Current Status: pulse 105-110- off BP meds since BP dropped- question dementia-lewy body?  nonpressure area on buttocks- not open-full work up so far is negative for  causes of cognition/behavior  Barriers to Discharge: Decreased family/caregiver support;Behavior;Home enviroment access/layout;Weight;Nutrition means  Barriers to Discharge Comments: sister sees the meedical/neuro issues- will need to f/u on Neuro as soon as they can. Possible Resolutions to [employer] Focus: min-mod A for LB- fatigue is awful still- will send  to Neurology after d/c no safety awareness- d/c tomorrow- cognition worse- progressive cognitively.   Continued Need for Acute Rehabilitation Level of Care: The patient requires daily medical management by a physician with specialized training in physical medicine and rehabilitation for the following reasons: Direction of a multidisciplinary physical rehabilitation program to maximize functional independence : Yes Medical management of patient stability for increased activity during participation in an intensive rehabilitation regime.: Yes Analysis of laboratory values and/or radiology reports with any subsequent need for medication adjustment and/or medical intervention. : Yes   I attest that I was present, lead the team conference, and concur with the assessment and plan of the team.   Tennis Must 04/29/2021, 4:25 PM

## 2021-04-29 NOTE — Progress Notes (Signed)
Occupational Therapy Discharge Summary  Patient Details  Name: Carrie [name] MRN: [id number] Date of Birth: November 13, 1960    Patient has met 2 of 9 long term goals. Note that the pt has made very limited/inconsistent progress during this hospitalization. Patient to discharge at overall [place] level. Patient's care partner is independent to provide the necessary physical and cognitive assistance at discharge. The pt has been significantly limited during this hospitalization 2/2 fatigue, weakness, and decreased motivation. The pt has physically and cognitively declined, requiring Min/Mod for ADL tasks and for functional transfers. Decreased safety and cognition limiting. Family members have been present for several OT sessions but declined formal training. Pt to DC home with family to assist PRN.   Reasons goals not met: Most goals not met 2/2 fatigue, weakness, and limited participation.  Recommendation:  Patient will benefit from ongoing skilled OT services in home health setting to continue to advance functional skills in the area of BADL and Reduce care partner burden.  Equipment: BSC  Reasons for discharge: discharge from hospital  Patient/family agrees with progress made and goals achieved: Yes  OT Discharge Precautions/Restrictions  Precautions Precautions: Fall Precaution Comments: fatigues quickly Restrictions Weight Bearing Restrictions: No Pain Pain Assessment Pain Scale: 0-10 Pain Score: 0-No pain ADL ADL Eating: Supervision/safety (for encouragement to eat, cues for attention) Where Assessed-Eating: Wheelchair Grooming: Supervision/safety Where Assessed-Grooming: Sitting at sink Upper Body Bathing: Supervision/safety Where Assessed-Upper Body Bathing: Sitting at sink Lower Body Bathing: Minimal assistance Where Assessed-Lower Body Bathing: Standing at sink, Sitting at sink Upper Body Dressing: Minimal assistance Where Assessed-Upper Body Dressing:  Sitting at sink Lower Body Dressing: Moderate assistance Where Assessed-Lower Body Dressing: Edge of bed Toileting: Moderate assistance Where Assessed-Toileting: Glass blower/designer: Psychiatric nurse Method: Arts development officer: Grab bars, Raised toilet seat Tub/Shower Transfer: Not assessed Social research officer, government: Not assessed Vision Baseline Vision/History: 1 Wears glasses Patient Visual Report: No change from baseline (note pt reports some hallucinations but MD/nursing aware) Vision Assessment?: No apparent visual deficits Perception  Perception: Within Functional Limits Praxis Praxis: Intact Cognition Overall Cognitive Status: Impaired/Different from baseline Arousal/Alertness: Lethargic Orientation Level: Oriented to person;Oriented to place;Oriented to situation Year: 2022 Month: August Day of Week: Incorrect Selective Attention: Impaired Memory: Impaired Memory Impairment: Storage deficit;Retrieval deficit Immediate Memory Recall: Sock;Blue;Bed Memory Recall Sock: With Cue Memory Recall Blue: With Cue Memory Recall Bed: Not able to recall Awareness: Impaired Problem Solving: Impaired Reasoning: Impaired Sequencing: Impaired Decision Making: Impaired Self Monitoring: Impaired Safety/Judgment: Impaired Comments: Requires constant cueing and occ physical assist to prevent falls d/t impaired safety awareness. Sensation Sensation Light Touch: Appears Intact Coordination Gross Motor Movements are Fluid and Coordinated: No Fine Motor Movements are Fluid and Coordinated: No Coordination and Movement Description: Continued decline in coordination with cognitive decline, now with posterior lean in standing and tremors in B hands Motor  Motor Motor: Abnormal postural alignment and control Motor - Skilled Clinical Observations: generalized weakness, stiffness, mild B dysmetria Motor - Discharge Observations: Worsened generalized  weakness and coordination d/t physical decline and limited participation in therapy. Mobility  Bed Mobility Bed Mobility: Supine to Sit;Sit to Supine Supine to Sit: Moderate Assistance - Patient 50-74% Sit to Supine: Moderate Assistance - Patient 50-74% Transfers Sit to Stand: Moderate Assistance - Patient 50-74% Stand to Sit: Moderate Assistance - Patient 50-74%  Trunk/Postural Assessment  Cervical Assessment Cervical Assessment: Exceptions to [facility] Thoracic Assessment Thoracic Assessment: Exceptions to [facility] Lumbar Assessment Lumbar Assessment: Within Functional  Limits Postural Control Postural Control: Deficits on evaluation Righting Reactions: delayed and inadequate Protective Responses: delayed and inadequate  Balance Balance Balance Assessed: Yes Static Sitting Balance Static Sitting - Balance Support: Feet supported Static Sitting - Level of Assistance: 5: Stand by assistance Dynamic Sitting Balance Dynamic Sitting - Balance Support: During functional activity;Feet supported Dynamic Sitting - Level of Assistance: 5: Stand by assistance Dynamic Sitting - Balance Activities: Lateral lean/weight shifting Static Standing Balance Static Standing - Balance Support: Bilateral upper extremity supported Static Standing - Level of Assistance: 4: Min assist Dynamic Standing Balance Dynamic Standing - Balance Support: Bilateral upper extremity supported;During functional activity Dynamic Standing - Level of Assistance: 3: Mod assist Extremity/Trunk Assessment RUE Assessment RUE Assessment: Exceptions to [facility] Active Range of Motion (AROM) Comments: WNL General Strength Comments: very weak at 3 to 3+/5, tremors present during functional activity LUE Assessment LUE Assessment: Exceptions to [facility] Active Range of Motion (AROM) Comments: WNL General Strength Comments: very weak at 3 to 3+/5, tremors present during functional activity   Carrie [name] 04/29/2021, 12:47 PM

## 2021-04-30 DIAGNOSIS — R5381 Other malaise: Secondary | ICD-10-CM | POA: Diagnosis not present

## 2021-04-30 NOTE — Progress Notes (Signed)
PROGRESS NOTE   Subjective/Complaints:  Pt sitting up in bed- excited about d/c date being today- said sister will get off work 1-1:30 pm and then come get her.   ROS:  Limited by cognition   Objective:   No results found. Recent Labs    04/28/21 0546  WBC 5.1  HGB 10.9*  HCT 31.8*  PLT 270      Recent Labs    04/28/21 0546  NA 135  K 4.0  CL 105  CO2 24  GLUCOSE 94  BUN 15  CREATININE 0.80  CALCIUM 8.9      Intake/Output Summary (Last 24 hours) at 04/30/2021 0836 Last data filed at 04/30/2021 0700 Gross per 24 hour  Intake 598 ml  Output 0 ml  Net 598 ml        Physical Exam: Vital Signs Blood pressure 121/87, pulse (!) 110, temperature 97.8 F (36.6 C), resp. rate 20, height 5\' 5"  (1.651 m), weight 71.1 kg, SpO2 100 %.     General: awake, alert, appropriate, NAD HENT: conjugate gaze; oropharynx moist CV: regular rhythm; tachycardic rate; no JVD Pulmonary: CTA B/L; no W/R/R- good air movement GI: soft, NT, ND, (+)BS Psychiatric: appropriate; excited about d/c.  Neurological: less confused this AM- alert  Skin: Warm and dry.  Intact. Musc: No edema in extremities.  No tenderness in extremities. Motor: 4+/5 in arms proximal and distally B/L HF 4/5; KE 4/5 DF/PF 4+/5 B/L   Assessment/Plan: 1. Functional deficits which require 3+ hours per day of interdisciplinary therapy in a comprehensive inpatient rehab setting. Physiatrist is providing close team supervision and 24 hour management of active medical problems listed below. Physiatrist and rehab team continue to assess barriers to discharge/monitor patient progress toward functional and medical goals  Care Tool:  Bathing    Body parts bathed by patient: Right arm, Left arm, Chest, Abdomen, Face, Right upper leg, Left upper leg, Front perineal area, Buttocks   Body parts bathed by helper: Right lower leg, Left lower leg      Bathing assist Assist Level: Minimal Assistance - Patient > 75%     Upper Body Dressing/Undressing Upper body dressing   What is the patient wearing?: Pull over shirt    Upper body assist Assist Level: Minimal Assistance - Patient > 75%    Lower Body Dressing/Undressing Lower body dressing      What is the patient wearing?: Pants, Underwear/pull up     Lower body assist Assist for lower body dressing: Maximal Assistance - Patient 25 - 49%     Toileting Toileting    Toileting assist Assist for toileting: Moderate Assistance - Patient 50 - 74%     Transfers Chair/bed transfer  Transfers assist     Chair/bed transfer assist level: Moderate Assistance - Patient 50 - 74%     Locomotion Ambulation   Ambulation assist   Ambulation activity did not occur: Refused  Assist level: Moderate Assistance - Patient 50 - 74% Assistive device: Walker-rolling Max distance: 4 ft   Walk 10 feet activity   Assist  Walk 10 feet activity did not occur: Refused  Assist level: Moderate Assistance - Patient - 61 -  74% Assistive device: Walker-rolling   Walk 50 feet activity   Assist Walk 50 feet with 2 turns activity did not occur: Refused  Assist level: Minimal Assistance - Patient > 75% Assistive device: Walker-rolling    Walk 150 feet activity   Assist Walk 150 feet activity did not occur: Refused         Walk 10 feet on uneven surface  activity   Assist Walk 10 feet on uneven surfaces activity did not occur: Safety/medical concerns         Wheelchair     Assist Is the patient using a wheelchair?: Yes Type of Wheelchair: Manual    Wheelchair assist level: Maximal Assistance - Patient 25 - 49% Max wheelchair distance: 50 (extreme incr time, not functional)    Wheelchair 50 feet with 2 turns activity    Assist        Assist Level: Maximal Assistance - Patient 25 - 49%   Wheelchair 150 feet activity     Assist      Assist  Level: Total Assistance - Patient < 25%   Blood pressure 121/87, pulse (!) 110, temperature 97.8 F (36.6 C), resp. rate 20, height 5\' 5"  (1.651 m), weight 71.1 kg, SpO2 100 %.    Medical Problem List and Plan: 1.  Debility secondary to generalized pain/rheumatoid arthritis.  Follow-up outpatient rheumatology services Dr .  Prednisone taper as indicated  -d/c today- needs to follow up with Neurology asap - concern that having hallucinations that was just told about yesterday- Lewy Body disease?? Fast decline in function and cognition since admission.  2.  Antithrombotics: -DVT/anticoagulation:  Pharmaceutical: Lovenox             -antiplatelet therapy: n/a 3. Pain Management: Tramadol as needed  11/6- on stable prednisone dose- pain controlled- pt has moon facies as side effect, reduced to 5mg    Controlled with meds on 11/19  11/22- usually pain controlled, con't regimen 4. Mood: Wellbutrin 300 mg daily, Lexapro 20 mg daily  11/12- added Abilify 5 mg QHS - con't regimen  Ritalin stopped on 11/18, monitor for changes  11/21- still depressed- but will check Ammonia due to confabulation per PT             -antipsychotic agents: N/A 5. Neuropsych: This patient is? capable of making decisions on her own behalf. 6. Skin/Wound Care: Routine skin checks 7. Fluids/Electrolytes/Nutrition: Routine in and outs 8.  Hypertension.  Norvasc, lisinopril daily.  Monitor with increased mobility- off BP meds   Vitals:   04/29/21 2007 04/30/21 0513  BP: 110/81 121/87  Pulse: (!) 102 (!) 110  Resp: 18 20  Temp: 97.9 F (36.6 C) 97.8 F (36.6 C)  SpO2: 97% 100%    11/23- controlled- con't regimen off meds 9.  Elevated LFTs/transaminitis- AST mildly elevated on 11/17, continue to monitor 10.  Hyperlipidemia.  Lipitor held due to elevated LFTs 11. Poor energy vit D level mildly reduced will supplement 1000IU daily  12. Hx of depression  11/3- on wellbutrin and Lexapro- high doses- will  con't  11/10- sister thinks she's bipolar-started Abilify 5 mg QHS as adjuvant  11/11- MRI (-) for stroke, etc   11/22- Ammonia is 11- so not the cause of confusion- I think it's the depression that's the cause- cannot find any other contributing factors.  13. Moderate to severe malnutrition  dietician consult 14.  Mild hyponatremia  Sodium 134 on 11/17, continue to monitor 15. Confabulation?  11/21- will check Ammonia  in AM ot make sure that's not reason 16.Sore throat- no thrush- 11/21-  Nystatin swish and swallow   LOS: 22 days A FACE TO FACE EVALUATION WAS PERFORMED  Derrien Anschutz 04/30/2021, 8:36 AM

## 2021-05-03 DIAGNOSIS — R5381 Other malaise: Secondary | ICD-10-CM | POA: Diagnosis not present

## 2021-05-03 DIAGNOSIS — M069 Rheumatoid arthritis, unspecified: Secondary | ICD-10-CM | POA: Diagnosis not present

## 2021-05-03 DIAGNOSIS — F322 Major depressive disorder, single episode, severe without psychotic features: Secondary | ICD-10-CM | POA: Diagnosis not present

## 2021-05-04 DIAGNOSIS — I10 Essential (primary) hypertension: Secondary | ICD-10-CM | POA: Diagnosis not present

## 2021-05-04 DIAGNOSIS — F32A Depression, unspecified: Secondary | ICD-10-CM | POA: Diagnosis not present

## 2021-05-04 DIAGNOSIS — E871 Hypo-osmolality and hyponatremia: Secondary | ICD-10-CM | POA: Diagnosis not present

## 2021-05-04 DIAGNOSIS — Z9181 History of falling: Secondary | ICD-10-CM | POA: Diagnosis not present

## 2021-05-04 DIAGNOSIS — Z79899 Other long term (current) drug therapy: Secondary | ICD-10-CM | POA: Diagnosis not present

## 2021-05-04 DIAGNOSIS — Z87891 Personal history of nicotine dependence: Secondary | ICD-10-CM | POA: Diagnosis not present

## 2021-05-04 DIAGNOSIS — E785 Hyperlipidemia, unspecified: Secondary | ICD-10-CM | POA: Diagnosis not present

## 2021-05-04 DIAGNOSIS — M069 Rheumatoid arthritis, unspecified: Secondary | ICD-10-CM | POA: Diagnosis not present

## 2021-05-04 DIAGNOSIS — R5381 Other malaise: Secondary | ICD-10-CM | POA: Diagnosis not present

## 2021-05-05 DIAGNOSIS — Z9181 History of falling: Secondary | ICD-10-CM | POA: Diagnosis not present

## 2021-05-05 DIAGNOSIS — E785 Hyperlipidemia, unspecified: Secondary | ICD-10-CM | POA: Diagnosis not present

## 2021-05-05 DIAGNOSIS — R5381 Other malaise: Secondary | ICD-10-CM | POA: Diagnosis not present

## 2021-05-05 DIAGNOSIS — Z79899 Other long term (current) drug therapy: Secondary | ICD-10-CM | POA: Diagnosis not present

## 2021-05-05 DIAGNOSIS — F32A Depression, unspecified: Secondary | ICD-10-CM | POA: Diagnosis not present

## 2021-05-05 DIAGNOSIS — I10 Essential (primary) hypertension: Secondary | ICD-10-CM | POA: Diagnosis not present

## 2021-05-05 DIAGNOSIS — E871 Hypo-osmolality and hyponatremia: Secondary | ICD-10-CM | POA: Diagnosis not present

## 2021-05-05 DIAGNOSIS — Z87891 Personal history of nicotine dependence: Secondary | ICD-10-CM | POA: Diagnosis not present

## 2021-05-05 DIAGNOSIS — M069 Rheumatoid arthritis, unspecified: Secondary | ICD-10-CM | POA: Diagnosis not present

## 2021-05-06 ENCOUNTER — Telehealth: Payer: Self-pay | Admitting: Family Medicine

## 2021-05-06 DIAGNOSIS — Z9181 History of falling: Secondary | ICD-10-CM | POA: Diagnosis not present

## 2021-05-06 DIAGNOSIS — Z87891 Personal history of nicotine dependence: Secondary | ICD-10-CM | POA: Diagnosis not present

## 2021-05-06 DIAGNOSIS — M069 Rheumatoid arthritis, unspecified: Secondary | ICD-10-CM | POA: Diagnosis not present

## 2021-05-06 DIAGNOSIS — F32A Depression, unspecified: Secondary | ICD-10-CM | POA: Diagnosis not present

## 2021-05-06 DIAGNOSIS — I10 Essential (primary) hypertension: Secondary | ICD-10-CM | POA: Diagnosis not present

## 2021-05-06 DIAGNOSIS — E871 Hypo-osmolality and hyponatremia: Secondary | ICD-10-CM | POA: Diagnosis not present

## 2021-05-06 DIAGNOSIS — Z79899 Other long term (current) drug therapy: Secondary | ICD-10-CM | POA: Diagnosis not present

## 2021-05-06 DIAGNOSIS — R5381 Other malaise: Secondary | ICD-10-CM | POA: Diagnosis not present

## 2021-05-06 DIAGNOSIS — E785 Hyperlipidemia, unspecified: Secondary | ICD-10-CM | POA: Diagnosis not present

## 2021-05-06 NOTE — Telephone Encounter (Signed)
Beth the PT from [facility] is calling to make Lowne aware that patients's heart rate was elevated today. She stated that at rest her HR was 116, she also stated that the patient said that was normal but PT wanted to let MD know anyway.

## 2021-05-08 DIAGNOSIS — M0579 Rheumatoid arthritis with rheumatoid factor of multiple sites without organ or systems involvement: Secondary | ICD-10-CM | POA: Diagnosis not present

## 2021-05-08 DIAGNOSIS — K760 Fatty (change of) liver, not elsewhere classified: Secondary | ICD-10-CM | POA: Diagnosis not present

## 2021-05-08 DIAGNOSIS — M79671 Pain in right foot: Secondary | ICD-10-CM | POA: Diagnosis not present

## 2021-05-08 DIAGNOSIS — M79642 Pain in left hand: Secondary | ICD-10-CM | POA: Diagnosis not present

## 2021-05-08 DIAGNOSIS — Z79899 Other long term (current) drug therapy: Secondary | ICD-10-CM | POA: Diagnosis not present

## 2021-05-08 DIAGNOSIS — M79672 Pain in left foot: Secondary | ICD-10-CM | POA: Diagnosis not present

## 2021-05-08 DIAGNOSIS — M79641 Pain in right hand: Secondary | ICD-10-CM | POA: Diagnosis not present

## 2021-05-08 DIAGNOSIS — R749 Abnormal serum enzyme level, unspecified: Secondary | ICD-10-CM | POA: Diagnosis not present

## 2021-05-12 DIAGNOSIS — R5381 Other malaise: Secondary | ICD-10-CM | POA: Diagnosis not present

## 2021-05-12 DIAGNOSIS — Z87891 Personal history of nicotine dependence: Secondary | ICD-10-CM | POA: Diagnosis not present

## 2021-05-12 DIAGNOSIS — E785 Hyperlipidemia, unspecified: Secondary | ICD-10-CM | POA: Diagnosis not present

## 2021-05-12 DIAGNOSIS — M069 Rheumatoid arthritis, unspecified: Secondary | ICD-10-CM | POA: Diagnosis not present

## 2021-05-12 DIAGNOSIS — E871 Hypo-osmolality and hyponatremia: Secondary | ICD-10-CM | POA: Diagnosis not present

## 2021-05-12 DIAGNOSIS — Z9181 History of falling: Secondary | ICD-10-CM | POA: Diagnosis not present

## 2021-05-12 DIAGNOSIS — F32A Depression, unspecified: Secondary | ICD-10-CM | POA: Diagnosis not present

## 2021-05-12 DIAGNOSIS — I10 Essential (primary) hypertension: Secondary | ICD-10-CM | POA: Diagnosis not present

## 2021-05-12 DIAGNOSIS — Z79899 Other long term (current) drug therapy: Secondary | ICD-10-CM | POA: Diagnosis not present

## 2021-05-14 NOTE — Telephone Encounter (Signed)
Pt called. LVM to return call 

## 2021-05-15 DIAGNOSIS — E871 Hypo-osmolality and hyponatremia: Secondary | ICD-10-CM | POA: Diagnosis not present

## 2021-05-15 DIAGNOSIS — Z87891 Personal history of nicotine dependence: Secondary | ICD-10-CM | POA: Diagnosis not present

## 2021-05-15 DIAGNOSIS — Z9181 History of falling: Secondary | ICD-10-CM | POA: Diagnosis not present

## 2021-05-15 DIAGNOSIS — Z79899 Other long term (current) drug therapy: Secondary | ICD-10-CM | POA: Diagnosis not present

## 2021-05-15 DIAGNOSIS — I10 Essential (primary) hypertension: Secondary | ICD-10-CM | POA: Diagnosis not present

## 2021-05-15 DIAGNOSIS — R5381 Other malaise: Secondary | ICD-10-CM | POA: Diagnosis not present

## 2021-05-15 DIAGNOSIS — E785 Hyperlipidemia, unspecified: Secondary | ICD-10-CM | POA: Diagnosis not present

## 2021-05-15 DIAGNOSIS — F32A Depression, unspecified: Secondary | ICD-10-CM | POA: Diagnosis not present

## 2021-05-15 DIAGNOSIS — M069 Rheumatoid arthritis, unspecified: Secondary | ICD-10-CM | POA: Diagnosis not present

## 2021-05-16 DIAGNOSIS — I10 Essential (primary) hypertension: Secondary | ICD-10-CM

## 2021-05-16 DIAGNOSIS — R5381 Other malaise: Secondary | ICD-10-CM

## 2021-05-16 DIAGNOSIS — Z87891 Personal history of nicotine dependence: Secondary | ICD-10-CM

## 2021-05-16 DIAGNOSIS — E785 Hyperlipidemia, unspecified: Secondary | ICD-10-CM

## 2021-05-16 DIAGNOSIS — M069 Rheumatoid arthritis, unspecified: Secondary | ICD-10-CM

## 2021-05-16 DIAGNOSIS — Z79899 Other long term (current) drug therapy: Secondary | ICD-10-CM

## 2021-05-16 DIAGNOSIS — F32A Depression, unspecified: Secondary | ICD-10-CM

## 2021-05-16 DIAGNOSIS — E871 Hypo-osmolality and hyponatremia: Secondary | ICD-10-CM

## 2021-05-16 DIAGNOSIS — Z9181 History of falling: Secondary | ICD-10-CM

## 2021-05-19 DIAGNOSIS — E785 Hyperlipidemia, unspecified: Secondary | ICD-10-CM | POA: Diagnosis not present

## 2021-05-19 DIAGNOSIS — F32A Depression, unspecified: Secondary | ICD-10-CM | POA: Diagnosis not present

## 2021-05-19 DIAGNOSIS — Z79899 Other long term (current) drug therapy: Secondary | ICD-10-CM | POA: Diagnosis not present

## 2021-05-19 DIAGNOSIS — R5381 Other malaise: Secondary | ICD-10-CM | POA: Diagnosis not present

## 2021-05-19 DIAGNOSIS — M069 Rheumatoid arthritis, unspecified: Secondary | ICD-10-CM | POA: Diagnosis not present

## 2021-05-19 DIAGNOSIS — Z9181 History of falling: Secondary | ICD-10-CM | POA: Diagnosis not present

## 2021-05-19 DIAGNOSIS — I10 Essential (primary) hypertension: Secondary | ICD-10-CM | POA: Diagnosis not present

## 2021-05-19 DIAGNOSIS — Z87891 Personal history of nicotine dependence: Secondary | ICD-10-CM | POA: Diagnosis not present

## 2021-05-19 DIAGNOSIS — E871 Hypo-osmolality and hyponatremia: Secondary | ICD-10-CM | POA: Diagnosis not present

## 2021-05-20 DIAGNOSIS — M069 Rheumatoid arthritis, unspecified: Secondary | ICD-10-CM | POA: Diagnosis not present

## 2021-05-20 DIAGNOSIS — Z87891 Personal history of nicotine dependence: Secondary | ICD-10-CM | POA: Diagnosis not present

## 2021-05-20 DIAGNOSIS — E871 Hypo-osmolality and hyponatremia: Secondary | ICD-10-CM | POA: Diagnosis not present

## 2021-05-20 DIAGNOSIS — Z9181 History of falling: Secondary | ICD-10-CM | POA: Diagnosis not present

## 2021-05-20 DIAGNOSIS — R5381 Other malaise: Secondary | ICD-10-CM | POA: Diagnosis not present

## 2021-05-20 DIAGNOSIS — I10 Essential (primary) hypertension: Secondary | ICD-10-CM | POA: Diagnosis not present

## 2021-05-20 DIAGNOSIS — Z79899 Other long term (current) drug therapy: Secondary | ICD-10-CM | POA: Diagnosis not present

## 2021-05-20 DIAGNOSIS — F32A Depression, unspecified: Secondary | ICD-10-CM | POA: Diagnosis not present

## 2021-05-20 DIAGNOSIS — E785 Hyperlipidemia, unspecified: Secondary | ICD-10-CM | POA: Diagnosis not present

## 2021-05-21 DIAGNOSIS — Z87891 Personal history of nicotine dependence: Secondary | ICD-10-CM | POA: Diagnosis not present

## 2021-05-21 DIAGNOSIS — I10 Essential (primary) hypertension: Secondary | ICD-10-CM | POA: Diagnosis not present

## 2021-05-21 DIAGNOSIS — E871 Hypo-osmolality and hyponatremia: Secondary | ICD-10-CM | POA: Diagnosis not present

## 2021-05-21 DIAGNOSIS — M069 Rheumatoid arthritis, unspecified: Secondary | ICD-10-CM | POA: Diagnosis not present

## 2021-05-21 DIAGNOSIS — E785 Hyperlipidemia, unspecified: Secondary | ICD-10-CM | POA: Diagnosis not present

## 2021-05-21 DIAGNOSIS — R5381 Other malaise: Secondary | ICD-10-CM | POA: Diagnosis not present

## 2021-05-21 DIAGNOSIS — Z79899 Other long term (current) drug therapy: Secondary | ICD-10-CM | POA: Diagnosis not present

## 2021-05-21 DIAGNOSIS — Z9181 History of falling: Secondary | ICD-10-CM | POA: Diagnosis not present

## 2021-05-21 DIAGNOSIS — F32A Depression, unspecified: Secondary | ICD-10-CM | POA: Diagnosis not present

## 2021-05-22 DIAGNOSIS — M3390 Dermatopolymyositis, unspecified, organ involvement unspecified: Secondary | ICD-10-CM | POA: Diagnosis not present

## 2021-05-22 DIAGNOSIS — Z79899 Other long term (current) drug therapy: Secondary | ICD-10-CM | POA: Diagnosis not present

## 2021-05-22 DIAGNOSIS — K760 Fatty (change of) liver, not elsewhere classified: Secondary | ICD-10-CM | POA: Diagnosis not present

## 2021-05-22 DIAGNOSIS — M0579 Rheumatoid arthritis with rheumatoid factor of multiple sites without organ or systems involvement: Secondary | ICD-10-CM | POA: Diagnosis not present

## 2021-05-22 DIAGNOSIS — M8589 Other specified disorders of bone density and structure, multiple sites: Secondary | ICD-10-CM | POA: Diagnosis not present

## 2021-05-23 ENCOUNTER — Encounter: Payer: Self-pay | Admitting: Family Medicine

## 2021-05-23 ENCOUNTER — Other Ambulatory Visit: Payer: Self-pay | Admitting: Family Medicine

## 2021-05-23 NOTE — Telephone Encounter (Signed)
She would need to contact proscribing provider, correct?

## 2021-05-25 DIAGNOSIS — Z9181 History of falling: Secondary | ICD-10-CM | POA: Diagnosis not present

## 2021-05-25 DIAGNOSIS — E871 Hypo-osmolality and hyponatremia: Secondary | ICD-10-CM | POA: Diagnosis not present

## 2021-05-25 DIAGNOSIS — E785 Hyperlipidemia, unspecified: Secondary | ICD-10-CM | POA: Diagnosis not present

## 2021-05-25 DIAGNOSIS — I10 Essential (primary) hypertension: Secondary | ICD-10-CM | POA: Diagnosis not present

## 2021-05-25 DIAGNOSIS — Z87891 Personal history of nicotine dependence: Secondary | ICD-10-CM | POA: Diagnosis not present

## 2021-05-25 DIAGNOSIS — R5381 Other malaise: Secondary | ICD-10-CM | POA: Diagnosis not present

## 2021-05-25 DIAGNOSIS — M069 Rheumatoid arthritis, unspecified: Secondary | ICD-10-CM | POA: Diagnosis not present

## 2021-05-25 DIAGNOSIS — Z79899 Other long term (current) drug therapy: Secondary | ICD-10-CM | POA: Diagnosis not present

## 2021-05-25 DIAGNOSIS — F32A Depression, unspecified: Secondary | ICD-10-CM | POA: Diagnosis not present

## 2021-05-26 ENCOUNTER — Other Ambulatory Visit: Payer: Self-pay | Admitting: Rheumatology

## 2021-05-26 ENCOUNTER — Telehealth (INDEPENDENT_AMBULATORY_CARE_PROVIDER_SITE_OTHER): Payer: Federal, State, Local not specified - PPO | Admitting: Family Medicine

## 2021-05-26 ENCOUNTER — Telehealth: Payer: Self-pay | Admitting: Family Medicine

## 2021-05-26 ENCOUNTER — Telehealth: Payer: Self-pay

## 2021-05-26 ENCOUNTER — Encounter: Payer: Self-pay | Admitting: Family Medicine

## 2021-05-26 DIAGNOSIS — E785 Hyperlipidemia, unspecified: Secondary | ICD-10-CM | POA: Diagnosis not present

## 2021-05-26 DIAGNOSIS — E871 Hypo-osmolality and hyponatremia: Secondary | ICD-10-CM | POA: Diagnosis not present

## 2021-05-26 DIAGNOSIS — Z79899 Other long term (current) drug therapy: Secondary | ICD-10-CM | POA: Diagnosis not present

## 2021-05-26 DIAGNOSIS — R5381 Other malaise: Secondary | ICD-10-CM | POA: Diagnosis not present

## 2021-05-26 DIAGNOSIS — I10 Essential (primary) hypertension: Secondary | ICD-10-CM

## 2021-05-26 DIAGNOSIS — F320 Major depressive disorder, single episode, mild: Secondary | ICD-10-CM | POA: Diagnosis not present

## 2021-05-26 DIAGNOSIS — Z87891 Personal history of nicotine dependence: Secondary | ICD-10-CM | POA: Diagnosis not present

## 2021-05-26 DIAGNOSIS — Z9181 History of falling: Secondary | ICD-10-CM | POA: Diagnosis not present

## 2021-05-26 DIAGNOSIS — R1013 Epigastric pain: Secondary | ICD-10-CM | POA: Diagnosis not present

## 2021-05-26 DIAGNOSIS — M3390 Dermatopolymyositis, unspecified, organ involvement unspecified: Secondary | ICD-10-CM

## 2021-05-26 DIAGNOSIS — M069 Rheumatoid arthritis, unspecified: Secondary | ICD-10-CM

## 2021-05-26 DIAGNOSIS — F418 Other specified anxiety disorders: Secondary | ICD-10-CM | POA: Diagnosis not present

## 2021-05-26 DIAGNOSIS — F32A Depression, unspecified: Secondary | ICD-10-CM | POA: Diagnosis not present

## 2021-05-26 DIAGNOSIS — R531 Weakness: Secondary | ICD-10-CM

## 2021-05-26 HISTORY — DX: Other specified anxiety disorders: F41.8

## 2021-05-26 MED ORDER — TRAMADOL HCL 50 MG PO TABS: 50.0000 mg | ORAL_TABLET | Freq: Two times a day (BID) | ORAL | 0 refills | Status: DC | PRN

## 2021-05-26 MED ORDER — ESCITALOPRAM OXALATE 20 MG PO TABS: 20.0000 mg | ORAL_TABLET | Freq: Every day | ORAL | 1 refills | Status: AC

## 2021-05-26 MED ORDER — BUPROPION HCL ER (XL) 300 MG PO TB24: 300.0000 mg | ORAL_TABLET | Freq: Every day | ORAL | 1 refills | Status: AC

## 2021-05-26 MED ORDER — VITAMIN D3 25 MCG PO TABS: 1000.0000 [IU] | ORAL_TABLET | Freq: Every day | ORAL | 1 refills | Status: AC

## 2021-05-26 MED ORDER — LISINOPRIL 20 MG PO TABS: 20.0000 mg | ORAL_TABLET | Freq: Every day | ORAL | 3 refills | Status: AC

## 2021-05-26 MED ORDER — ARIPIPRAZOLE 5 MG PO TABS: 5.0000 mg | ORAL_TABLET | Freq: Every day | ORAL | 2 refills | Status: DC

## 2021-05-26 MED ORDER — PANTOPRAZOLE SODIUM 40 MG PO TBEC: 40.0000 mg | DELAYED_RELEASE_TABLET | Freq: Every day | ORAL | 3 refills | Status: AC

## 2021-05-26 NOTE — Telephone Encounter (Signed)
They can have a verbal order for L buttock and betadine for R hand scab wounds- She or sister needs to call PCP about her elevated BP - Since I didn't do it, unfortunately, I cannot fix that issue- thanks- ML

## 2021-05-26 NOTE — Assessment & Plan Note (Signed)
Per rheum  Pt on prednisone

## 2021-05-26 NOTE — Telephone Encounter (Signed)
Donita RN ( with [facility] call:   Seeking a verbal order to apply Zinc Oxide to Onarga S. Caterino left buttock wound.  With a frequency of twice daily and as needed.   Also to apply betadine to her right -hand knuckle scab wound and leave open to air for healing. The wound is about 57 weeks old.  She has been taken off her blood pressure medication and yesterday the blood pressure reading was 150/100 without complaints.    Please advise.  (Call back phone [phone]).

## 2021-05-26 NOTE — Telephone Encounter (Signed)
Home health stated in nurse eval yesterday, bp was 150/100 but has no other symptoms and just wanted to make pcp aware.

## 2021-05-26 NOTE — Progress Notes (Signed)
Virtual telephone visit    Virtual Visit via Telephone Note   This visit type was conducted due to national recommendations for restrictions regarding the COVID-19 Pandemic (e.g. social distancing) in an effort to limit this patient's exposure and mitigate transmission in our community. Due to her co-morbid illnesses, this patient is at least at moderate risk for complications without adequate follow up. This format is felt to be most appropriate for this patient at this time. The patient did not have access to video technology or had technical difficulties with video requiring transitioning to audio format only (telephone). Physical exam was limited to content and character of the telephone converstion. Heather s was able to get the patient set up on a telephone visit.   Patient location: home alone Patient and provider in visit Provider location: Office  I discussed the limitations of evaluation and management by telemedicine and the availability of in person appointments. The patient expressed understanding and agreed to proceed.   Visit Date: 05/26/2021  Today's healthcare provider: Ann Held, DO     Subjective:    Patient ID: Carrie [name], female    DOB: [birth year], 60 y.o.   MRN: [id number]  Chief Complaint  Patient presents with   Hospitalization Follow-up    HPI Patient is in today for hosp f/u for fall --- she states her bp has been high   Past Medical History:  Diagnosis Date   Hyperlipidemia    Hypertension     Past Surgical History:  Procedure Laterality Date   ABDOMINAL HYSTERECTOMY     REDUCTION MAMMAPLASTY      Family History  Problem Relation Age of Onset   Breast cancer Mother        deceased by 34   Breast cancer Sister 72   Diabetes Sister    Breast cancer Other    Coronary artery disease Other    Hypertension Other     Social History   Socioeconomic History   Marital status: Single    Spouse name: Not on file   Number of  children: Not on file   Years of education: Not on file   Highest education level: Not on file  Occupational History   Not on file  Tobacco Use   Smoking status: Former    Types: Cigarettes    Quit date: 02/05/2014    Years since quitting: 7.3   Smokeless tobacco: Never   Tobacco comments:    1 cig every 4-5 days ---weaning off  Vaping Use   Vaping Use: Unknown  Substance and Sexual Activity   Alcohol use: Yes    Alcohol/week: 0.0 standard drinks   Drug use: No   Sexual activity: Not Currently    Partners: Male  Other Topics Concern   Not on file  Social History Narrative   Exercise --rare   Social Determinants of Health   Financial Resource Strain: Not on file  Food Insecurity: Not on file  Transportation Needs: Not on file  Physical Activity: Not on file  Stress: Not on file  Social Connections: Not on file  Intimate Partner Violence: Not on file    Outpatient Medications Prior to Visit  Medication Sig Dispense Refill   acetaminophen (TYLENOL) 325 MG tablet Take 2 tablets (650 mg total) by mouth every 6 (six) hours as needed for mild pain (or Fever >/= 101).     fluticasone (FLONASE) 50 MCG/ACT nasal spray Place 2 sprays into both nostrils daily. (Patient taking  differently: Place 2 sprays into both nostrils daily as needed for allergies.) 16 g 6   loratadine (CLARITIN) 10 MG tablet Take 1 tablet (10 mg total) by mouth daily. 30 tablet 11   senna-docusate (SENOKOT-S) 8.6-50 MG tablet Take 1 tablet by mouth 2 (two) times daily.     ARIPiprazole (ABILIFY) 5 MG tablet Take 1 tablet (5 mg total) by mouth at bedtime. 30 tablet 0   buPROPion (WELLBUTRIN XL) 300 MG 24 hr tablet Take 1 tablet (300 mg total) by mouth daily. 30 tablet 0   cholecalciferol (VITAMIN D) 25 MCG tablet Take 1 tablet (1,000 Units total) by mouth daily. 30 tablet 0   escitalopram (LEXAPRO) 20 MG tablet Take 1 tablet (20 mg total) by mouth daily. 30 tablet 2   pantoprazole (PROTONIX) 40 MG tablet Take 1  tablet (40 mg total) by mouth daily. 30 tablet 3   traMADol (ULTRAM) 50 MG tablet Take 1 tablet (50 mg total) by mouth every 12 (twelve) hours as needed for moderate pain. 30 tablet 0   No facility-administered medications prior to visit.    Allergies  Allergen Reactions   Zoloft [Sertraline Hcl] Diarrhea and Other (See Comments)    Stomach upset     Review of Systems  Constitutional:  Negative for chills, fever and malaise/fatigue.  HENT:  Negative for congestion and hearing loss.   Eyes:  Negative for discharge.  Respiratory:  Negative for cough, sputum production and shortness of breath.   Cardiovascular:  Negative for chest pain, palpitations and leg swelling.  Gastrointestinal:  Negative for abdominal pain, blood in stool, constipation, diarrhea, heartburn, nausea and vomiting.  Genitourinary:  Negative for dysuria, frequency, hematuria and urgency.  Musculoskeletal:  Positive for falls. Negative for back pain and myalgias.  Skin:  Negative for rash.  Neurological:  Positive for weakness. Negative for dizziness, sensory change, loss of consciousness and headaches.  Endo/Heme/Allergies:  Negative for environmental allergies. Does not bruise/bleed easily.  Psychiatric/Behavioral:  Negative for depression and suicidal ideas. The patient is not nervous/anxious and does not have insomnia.       Objective:    Physical Exam Vitals and nursing note reviewed.  Neurological:     Mental Status: She is alert.    There were no vitals taken for this visit. Wt Readings from Last 3 Encounters:  04/23/21 156 lb 12 oz (71.1 kg)  04/08/21 164 lb 0.4 oz (74.4 kg)  01/17/21 178 lb 9.6 oz (81 kg)    Diabetic Foot Exam - Simple   No data filed    Lab Results  Component Value Date   WBC 5.1 04/28/2021   HGB 10.9 (L) 04/28/2021   HCT 31.8 (L) 04/28/2021   PLT 270 04/28/2021   GLUCOSE 94 04/28/2021   CHOL 190 12/24/2020   TRIG 92.0 12/24/2020   HDL 47.10 12/24/2020   LDLDIRECT  213.8 08/29/2012   LDLCALC 124 (H) 12/24/2020   ALT 32 04/28/2021   AST 51 (H) 04/28/2021   NA 135 04/28/2021   K 4.0 04/28/2021   CL 105 04/28/2021   CREATININE 0.80 04/28/2021   BUN 15 04/28/2021   CO2 24 04/28/2021   TSH 2.167 04/09/2021   HGBA1C 6.0 11/29/2014    Lab Results  Component Value Date   TSH 2.167 04/09/2021   Lab Results  Component Value Date   WBC 5.1 04/28/2021   HGB 10.9 (L) 04/28/2021   HCT 31.8 (L) 04/28/2021   MCV 91.4 04/28/2021   PLT 270  04/28/2021   Lab Results  Component Value Date   NA 135 04/28/2021   K 4.0 04/28/2021   CO2 24 04/28/2021   GLUCOSE 94 04/28/2021   BUN 15 04/28/2021   CREATININE 0.80 04/28/2021   BILITOT 0.8 04/28/2021   ALKPHOS 61 04/28/2021   AST 51 (H) 04/28/2021   ALT 32 04/28/2021   PROT 6.0 (L) 04/28/2021   ALBUMIN 2.4 (L) 04/28/2021   CALCIUM 8.9 04/28/2021   ANIONGAP 6 04/28/2021   GFR 54.06 (L) 12/24/2020   Lab Results  Component Value Date   CHOL 190 12/24/2020   Lab Results  Component Value Date   HDL 47.10 12/24/2020   Lab Results  Component Value Date   LDLCALC 124 (H) 12/24/2020   Lab Results  Component Value Date   TRIG 92.0 12/24/2020   Lab Results  Component Value Date   CHOLHDL 4 12/24/2020   Lab Results  Component Value Date   HGBA1C 6.0 11/29/2014       Assessment & Plan:   Problem List Items Addressed This Visit       Unprioritized   Depression with anxiety   Relevant Medications   escitalopram (LEXAPRO) 20 MG tablet   buPROPion (WELLBUTRIN XL) 300 MG 24 hr tablet   Dyspepsia   Relevant Medications   pantoprazole (PROTONIX) 40 MG tablet   Essential hypertension    Poorly controlled will alter medications, encouraged DASH diet, minimize caffeine and obtain adequate sleep. Report concerning symptoms and follow up as directed and as needed Restart lisinopril 20 mg and f/u in office       Relevant Medications   lisinopril (ZESTRIL) 20 MG tablet   Generalized weakness     F/u neuro  con't pt       Rheumatoid arthritis flare (HCC)    Per rheum  Pt on prednisone       Relevant Medications   traMADol (ULTRAM) 50 MG tablet   Other Visit Diagnoses     Primary hypertension    -  Primary   Relevant Medications   lisinopril (ZESTRIL) 20 MG tablet   Depression, major, single episode, mild (HCC)       Relevant Medications   escitalopram (LEXAPRO) 20 MG tablet   buPROPion (WELLBUTRIN XL) 300 MG 24 hr tablet       I have changed Lemma S. Villalona's Vitamin D3. I am also having her start on lisinopril. Additionally, I am having her maintain her fluticasone, loratadine, acetaminophen, senna-docusate, escitalopram, buPROPion, ARIPiprazole, pantoprazole, and traMADol.  Meds ordered this encounter  Medications   escitalopram (LEXAPRO) 20 MG tablet    Sig: Take 1 tablet (20 mg total) by mouth daily.    Dispense:  90 tablet    Refill:  1   buPROPion (WELLBUTRIN XL) 300 MG 24 hr tablet    Sig: Take 1 tablet (300 mg total) by mouth daily.    Dispense:  90 tablet    Refill:  1   Vitamin D3 (VITAMIN D) 25 MCG tablet    Sig: Take 1 tablet (1,000 Units total) by mouth daily.    Dispense:  90 tablet    Refill:  1   ARIPiprazole (ABILIFY) 5 MG tablet    Sig: Take 1 tablet (5 mg total) by mouth at bedtime.    Dispense:  30 tablet    Refill:  2   pantoprazole (PROTONIX) 40 MG tablet    Sig: Take 1 tablet (40 mg total) by mouth daily.  Dispense:  90 tablet    Refill:  3   traMADol (ULTRAM) 50 MG tablet    Sig: Take 1 tablet (50 mg total) by mouth every 12 (twelve) hours as needed for moderate pain.    Dispense:  30 tablet    Refill:  0   lisinopril (ZESTRIL) 20 MG tablet    Sig: Take 1 tablet (20 mg total) by mouth daily.    Dispense:  90 tablet    Refill:  3     I discussed the assessment and treatment plan with the patient. The patient was provided an opportunity to ask questions and all were answered. The patient agreed with the plan and  demonstrated an understanding of the instructions.   The patient was advised to call back or seek an in-person evaluation if the symptoms worsen or if the condition fails to improve as anticipated.  I provided 30 minutes of non-face-to-face time during this encounter.   Ann Held, DO [place] [facility] [phone] (phone) [phone] (fax)  [place]

## 2021-05-26 NOTE — Telephone Encounter (Signed)
Dr. Dahlia Client  reply left on Donita's RN voice mail.

## 2021-05-26 NOTE — Telephone Encounter (Signed)
Hospital notes reviewed per protocol:   Verbal okay granted for in-home nursing care once a week for one week, then once a week for 3 weeks. Okay given to Danita RN with [facility], Phone number [phone].

## 2021-05-26 NOTE — Assessment & Plan Note (Signed)
F/u neuro  con't pt

## 2021-05-26 NOTE — Assessment & Plan Note (Signed)
Poorly controlled will alter medications, encouraged DASH diet, minimize caffeine and obtain adequate sleep. Report concerning symptoms and follow up as directed and as needed Restart lisinopril 20 mg and f/u in office

## 2021-05-27 ENCOUNTER — Telehealth: Payer: Self-pay | Admitting: Family Medicine

## 2021-05-27 ENCOUNTER — Ambulatory Visit: Payer: Federal, State, Local not specified - PPO | Admitting: Family Medicine

## 2021-05-27 DIAGNOSIS — F32A Depression, unspecified: Secondary | ICD-10-CM | POA: Diagnosis not present

## 2021-05-27 DIAGNOSIS — Z79899 Other long term (current) drug therapy: Secondary | ICD-10-CM | POA: Diagnosis not present

## 2021-05-27 DIAGNOSIS — Z9181 History of falling: Secondary | ICD-10-CM | POA: Diagnosis not present

## 2021-05-27 DIAGNOSIS — I10 Essential (primary) hypertension: Secondary | ICD-10-CM | POA: Diagnosis not present

## 2021-05-27 DIAGNOSIS — R5381 Other malaise: Secondary | ICD-10-CM | POA: Diagnosis not present

## 2021-05-27 DIAGNOSIS — Z87891 Personal history of nicotine dependence: Secondary | ICD-10-CM | POA: Diagnosis not present

## 2021-05-27 DIAGNOSIS — M069 Rheumatoid arthritis, unspecified: Secondary | ICD-10-CM | POA: Diagnosis not present

## 2021-05-27 DIAGNOSIS — E871 Hypo-osmolality and hyponatremia: Secondary | ICD-10-CM | POA: Diagnosis not present

## 2021-05-27 DIAGNOSIS — E785 Hyperlipidemia, unspecified: Secondary | ICD-10-CM | POA: Diagnosis not present

## 2021-05-27 NOTE — Telephone Encounter (Signed)
-----   Message from Donato Schultz, DO sent at 05/26/2021  4:55 PM EST ----- Pt needs ov in person

## 2021-05-27 NOTE — Telephone Encounter (Signed)
Patient states she wont be able to come in today at 6pm due to transportation. She said she could come in after the holidays. She also states she saw the nurse today and her bp did go down to 100/85. She states she will keep up with the nurse and call if her bp goes up again.

## 2021-05-27 NOTE — Telephone Encounter (Signed)
Noted  

## 2021-05-27 NOTE — Telephone Encounter (Signed)
pt scheduled ov 12/20.

## 2021-05-27 NOTE — Telephone Encounter (Signed)
LVM to schedule with lowne

## 2021-05-28 NOTE — Telephone Encounter (Signed)
FYI

## 2021-05-30 NOTE — Telephone Encounter (Signed)
Pt called back to speak with Heather.  

## 2021-05-30 NOTE — Telephone Encounter (Signed)
Pt called. LDVM 

## 2021-05-30 NOTE — Telephone Encounter (Signed)
Another VM left

## 2021-06-02 DIAGNOSIS — E871 Hypo-osmolality and hyponatremia: Secondary | ICD-10-CM | POA: Diagnosis not present

## 2021-06-02 DIAGNOSIS — Z9181 History of falling: Secondary | ICD-10-CM | POA: Diagnosis not present

## 2021-06-02 DIAGNOSIS — R5381 Other malaise: Secondary | ICD-10-CM | POA: Diagnosis not present

## 2021-06-02 DIAGNOSIS — Z79899 Other long term (current) drug therapy: Secondary | ICD-10-CM | POA: Diagnosis not present

## 2021-06-02 DIAGNOSIS — F32A Depression, unspecified: Secondary | ICD-10-CM | POA: Diagnosis not present

## 2021-06-02 DIAGNOSIS — E785 Hyperlipidemia, unspecified: Secondary | ICD-10-CM | POA: Diagnosis not present

## 2021-06-02 DIAGNOSIS — Z87891 Personal history of nicotine dependence: Secondary | ICD-10-CM | POA: Diagnosis not present

## 2021-06-02 DIAGNOSIS — F322 Major depressive disorder, single episode, severe without psychotic features: Secondary | ICD-10-CM | POA: Diagnosis not present

## 2021-06-02 DIAGNOSIS — I10 Essential (primary) hypertension: Secondary | ICD-10-CM | POA: Diagnosis not present

## 2021-06-02 DIAGNOSIS — M069 Rheumatoid arthritis, unspecified: Secondary | ICD-10-CM | POA: Diagnosis not present

## 2021-06-03 DIAGNOSIS — Z79899 Other long term (current) drug therapy: Secondary | ICD-10-CM | POA: Diagnosis not present

## 2021-06-03 DIAGNOSIS — E871 Hypo-osmolality and hyponatremia: Secondary | ICD-10-CM | POA: Diagnosis not present

## 2021-06-03 DIAGNOSIS — E785 Hyperlipidemia, unspecified: Secondary | ICD-10-CM | POA: Diagnosis not present

## 2021-06-03 DIAGNOSIS — Z9181 History of falling: Secondary | ICD-10-CM | POA: Diagnosis not present

## 2021-06-03 DIAGNOSIS — M069 Rheumatoid arthritis, unspecified: Secondary | ICD-10-CM | POA: Diagnosis not present

## 2021-06-03 DIAGNOSIS — R5381 Other malaise: Secondary | ICD-10-CM | POA: Diagnosis not present

## 2021-06-03 DIAGNOSIS — I10 Essential (primary) hypertension: Secondary | ICD-10-CM | POA: Diagnosis not present

## 2021-06-03 DIAGNOSIS — Z87891 Personal history of nicotine dependence: Secondary | ICD-10-CM | POA: Diagnosis not present

## 2021-06-03 DIAGNOSIS — F32A Depression, unspecified: Secondary | ICD-10-CM | POA: Diagnosis not present

## 2021-06-05 ENCOUNTER — Telehealth: Payer: Self-pay | Admitting: Family Medicine

## 2021-06-05 NOTE — Telephone Encounter (Signed)
kelly from [facility] called to extend pt for the pt. 2 week 3 and 1 week 1. [phone]. please advise.

## 2021-06-05 NOTE — Telephone Encounter (Signed)
Verbal given 

## 2021-06-08 DIAGNOSIS — Z79899 Other long term (current) drug therapy: Secondary | ICD-10-CM | POA: Diagnosis not present

## 2021-06-08 DIAGNOSIS — R5381 Other malaise: Secondary | ICD-10-CM | POA: Diagnosis not present

## 2021-06-08 DIAGNOSIS — F322 Major depressive disorder, single episode, severe without psychotic features: Secondary | ICD-10-CM | POA: Diagnosis not present

## 2021-06-08 DIAGNOSIS — E785 Hyperlipidemia, unspecified: Secondary | ICD-10-CM | POA: Diagnosis not present

## 2021-06-08 DIAGNOSIS — Z87891 Personal history of nicotine dependence: Secondary | ICD-10-CM | POA: Diagnosis not present

## 2021-06-08 DIAGNOSIS — F32A Depression, unspecified: Secondary | ICD-10-CM | POA: Diagnosis not present

## 2021-06-08 DIAGNOSIS — E871 Hypo-osmolality and hyponatremia: Secondary | ICD-10-CM | POA: Diagnosis not present

## 2021-06-08 DIAGNOSIS — Z9181 History of falling: Secondary | ICD-10-CM | POA: Diagnosis not present

## 2021-06-08 DIAGNOSIS — M069 Rheumatoid arthritis, unspecified: Secondary | ICD-10-CM | POA: Diagnosis not present

## 2021-06-08 DIAGNOSIS — I10 Essential (primary) hypertension: Secondary | ICD-10-CM | POA: Diagnosis not present

## 2021-06-10 DIAGNOSIS — I10 Essential (primary) hypertension: Secondary | ICD-10-CM | POA: Diagnosis not present

## 2021-06-10 DIAGNOSIS — Z9181 History of falling: Secondary | ICD-10-CM | POA: Diagnosis not present

## 2021-06-10 DIAGNOSIS — Z87891 Personal history of nicotine dependence: Secondary | ICD-10-CM | POA: Diagnosis not present

## 2021-06-10 DIAGNOSIS — E785 Hyperlipidemia, unspecified: Secondary | ICD-10-CM | POA: Diagnosis not present

## 2021-06-10 DIAGNOSIS — E871 Hypo-osmolality and hyponatremia: Secondary | ICD-10-CM | POA: Diagnosis not present

## 2021-06-10 DIAGNOSIS — M069 Rheumatoid arthritis, unspecified: Secondary | ICD-10-CM | POA: Diagnosis not present

## 2021-06-10 DIAGNOSIS — F32A Depression, unspecified: Secondary | ICD-10-CM | POA: Diagnosis not present

## 2021-06-10 DIAGNOSIS — R5381 Other malaise: Secondary | ICD-10-CM | POA: Diagnosis not present

## 2021-06-10 DIAGNOSIS — Z79899 Other long term (current) drug therapy: Secondary | ICD-10-CM | POA: Diagnosis not present

## 2021-06-12 ENCOUNTER — Telehealth: Payer: Self-pay

## 2021-06-12 DIAGNOSIS — Z79899 Other long term (current) drug therapy: Secondary | ICD-10-CM | POA: Diagnosis not present

## 2021-06-12 DIAGNOSIS — E785 Hyperlipidemia, unspecified: Secondary | ICD-10-CM | POA: Diagnosis not present

## 2021-06-12 DIAGNOSIS — I10 Essential (primary) hypertension: Secondary | ICD-10-CM | POA: Diagnosis not present

## 2021-06-12 DIAGNOSIS — Z9181 History of falling: Secondary | ICD-10-CM | POA: Diagnosis not present

## 2021-06-12 DIAGNOSIS — E871 Hypo-osmolality and hyponatremia: Secondary | ICD-10-CM | POA: Diagnosis not present

## 2021-06-12 DIAGNOSIS — R5381 Other malaise: Secondary | ICD-10-CM | POA: Diagnosis not present

## 2021-06-12 DIAGNOSIS — Z87891 Personal history of nicotine dependence: Secondary | ICD-10-CM | POA: Diagnosis not present

## 2021-06-12 DIAGNOSIS — M069 Rheumatoid arthritis, unspecified: Secondary | ICD-10-CM | POA: Diagnosis not present

## 2021-06-12 DIAGNOSIS — F32A Depression, unspecified: Secondary | ICD-10-CM | POA: Diagnosis not present

## 2021-06-12 NOTE — Telephone Encounter (Signed)
Beth, PT asst called to inform us that the patient had a heartrate of 120-122 today when she went to do PT. She stated she was withholding PT today.

## 2021-06-16 DIAGNOSIS — Z9181 History of falling: Secondary | ICD-10-CM | POA: Diagnosis not present

## 2021-06-16 DIAGNOSIS — Z79899 Other long term (current) drug therapy: Secondary | ICD-10-CM | POA: Diagnosis not present

## 2021-06-16 DIAGNOSIS — F32A Depression, unspecified: Secondary | ICD-10-CM | POA: Diagnosis not present

## 2021-06-16 DIAGNOSIS — Z87891 Personal history of nicotine dependence: Secondary | ICD-10-CM | POA: Diagnosis not present

## 2021-06-16 DIAGNOSIS — E785 Hyperlipidemia, unspecified: Secondary | ICD-10-CM | POA: Diagnosis not present

## 2021-06-16 DIAGNOSIS — E871 Hypo-osmolality and hyponatremia: Secondary | ICD-10-CM | POA: Diagnosis not present

## 2021-06-16 DIAGNOSIS — I10 Essential (primary) hypertension: Secondary | ICD-10-CM | POA: Diagnosis not present

## 2021-06-16 DIAGNOSIS — M069 Rheumatoid arthritis, unspecified: Secondary | ICD-10-CM | POA: Diagnosis not present

## 2021-06-16 DIAGNOSIS — R5381 Other malaise: Secondary | ICD-10-CM | POA: Diagnosis not present

## 2021-06-19 DIAGNOSIS — M069 Rheumatoid arthritis, unspecified: Secondary | ICD-10-CM | POA: Diagnosis not present

## 2021-06-19 DIAGNOSIS — K760 Fatty (change of) liver, not elsewhere classified: Secondary | ICD-10-CM | POA: Diagnosis not present

## 2021-06-19 DIAGNOSIS — I10 Essential (primary) hypertension: Secondary | ICD-10-CM | POA: Diagnosis not present

## 2021-06-19 DIAGNOSIS — M0579 Rheumatoid arthritis with rheumatoid factor of multiple sites without organ or systems involvement: Secondary | ICD-10-CM | POA: Diagnosis not present

## 2021-06-19 DIAGNOSIS — R5381 Other malaise: Secondary | ICD-10-CM | POA: Diagnosis not present

## 2021-06-19 DIAGNOSIS — M3390 Dermatopolymyositis, unspecified, organ involvement unspecified: Secondary | ICD-10-CM | POA: Diagnosis not present

## 2021-06-19 DIAGNOSIS — Z87891 Personal history of nicotine dependence: Secondary | ICD-10-CM | POA: Diagnosis not present

## 2021-06-19 DIAGNOSIS — Z79899 Other long term (current) drug therapy: Secondary | ICD-10-CM | POA: Diagnosis not present

## 2021-06-19 DIAGNOSIS — F32A Depression, unspecified: Secondary | ICD-10-CM | POA: Diagnosis not present

## 2021-06-19 DIAGNOSIS — Z9181 History of falling: Secondary | ICD-10-CM | POA: Diagnosis not present

## 2021-06-19 DIAGNOSIS — E785 Hyperlipidemia, unspecified: Secondary | ICD-10-CM | POA: Diagnosis not present

## 2021-06-19 DIAGNOSIS — E871 Hypo-osmolality and hyponatremia: Secondary | ICD-10-CM | POA: Diagnosis not present

## 2021-06-20 DIAGNOSIS — F32A Depression, unspecified: Secondary | ICD-10-CM | POA: Diagnosis not present

## 2021-06-20 DIAGNOSIS — E871 Hypo-osmolality and hyponatremia: Secondary | ICD-10-CM | POA: Diagnosis not present

## 2021-06-20 DIAGNOSIS — Z9181 History of falling: Secondary | ICD-10-CM | POA: Diagnosis not present

## 2021-06-20 DIAGNOSIS — M069 Rheumatoid arthritis, unspecified: Secondary | ICD-10-CM | POA: Diagnosis not present

## 2021-06-20 DIAGNOSIS — R5381 Other malaise: Secondary | ICD-10-CM | POA: Diagnosis not present

## 2021-06-20 DIAGNOSIS — Z79899 Other long term (current) drug therapy: Secondary | ICD-10-CM | POA: Diagnosis not present

## 2021-06-20 DIAGNOSIS — Z87891 Personal history of nicotine dependence: Secondary | ICD-10-CM | POA: Diagnosis not present

## 2021-06-20 DIAGNOSIS — I10 Essential (primary) hypertension: Secondary | ICD-10-CM | POA: Diagnosis not present

## 2021-06-20 DIAGNOSIS — E785 Hyperlipidemia, unspecified: Secondary | ICD-10-CM | POA: Diagnosis not present

## 2021-06-23 ENCOUNTER — Ambulatory Visit: Payer: Federal, State, Local not specified - PPO | Admitting: Neurology

## 2021-06-23 ENCOUNTER — Other Ambulatory Visit: Payer: Self-pay

## 2021-06-23 ENCOUNTER — Encounter: Payer: Self-pay | Admitting: Neurology

## 2021-06-23 VITALS — BP 114/84 | HR 94 | Ht 65.0 in | Wt 140.5 lb

## 2021-06-23 DIAGNOSIS — R410 Disorientation, unspecified: Secondary | ICD-10-CM | POA: Diagnosis not present

## 2021-06-23 DIAGNOSIS — M3313 Other dermatomyositis without myopathy: Secondary | ICD-10-CM | POA: Insufficient documentation

## 2021-06-23 DIAGNOSIS — R531 Weakness: Secondary | ICD-10-CM | POA: Diagnosis not present

## 2021-06-23 DIAGNOSIS — M3390 Dermatopolymyositis, unspecified, organ involvement unspecified: Secondary | ICD-10-CM

## 2021-06-23 DIAGNOSIS — M1990 Unspecified osteoarthritis, unspecified site: Secondary | ICD-10-CM | POA: Insufficient documentation

## 2021-06-23 DIAGNOSIS — K760 Fatty (change of) liver, not elsewhere classified: Secondary | ICD-10-CM | POA: Insufficient documentation

## 2021-06-23 DIAGNOSIS — F32A Depression, unspecified: Secondary | ICD-10-CM

## 2021-06-23 DIAGNOSIS — M069 Rheumatoid arthritis, unspecified: Secondary | ICD-10-CM

## 2021-06-23 HISTORY — DX: Fatty (change of) liver, not elsewhere classified: K76.0

## 2021-06-23 HISTORY — DX: Other dermatomyositis without myopathy: M33.13

## 2021-06-23 HISTORY — DX: Dermatopolymyositis, unspecified, organ involvement unspecified: M33.90

## 2021-06-23 NOTE — Progress Notes (Signed)
[facility]  PATIENT: Carrie [name] DOB: 09/13/1960  REQUESTING CLINICIAN: Lahoma Rocker, MD HISTORY FROM: Patient and 2 nieces  REASON FOR VISIT: Generalized weakness, confusion and forgetfulness    HISTORICAL  CHIEF COMPLAINT:  Chief Complaint  Patient presents with   New Patient (Initial Visit)    Rm 13. Accompanied by nieces (caretakers). NP/Paper/[name] [facility] /Intermittent confusion. Pt reports difficulty ambulating, speaking clearly, and forgetfulness.    HISTORY OF PRESENT ILLNESS:  This is a 61 year old woman with past medical history of rheumatoid arthritis, hypertension, hyperlipidemia, chronic pain, generalized weakness and anxiety/depression who is presenting with complaint of generalized weakness, confusion and period of forgetfulness.  In brief patient reported back in August she had fell, she presented to the ED but was later discharged.  She reported a few weeks later he presented to the hospital for continued generalized weakness, at that time she was admitted for further work-up.  Once stabilized she was on discharge to rehab, she reported she was getting extensive rehab, there was some improvement of her rehab and discharged home.  She will continue to receive PT and OT twice a week but weakness has improved but not to the point that she is able to walk independently.  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  Referral today is for confusion and periods of forgetfulness.  Family reported patient is somehow confused and forgetful at times and this has been going on since recently.  She is under a lot of stress due to her's son going through some stuff.  She was previously seen by a psychiatrist and therapist but has not seen anyone recently.    Reviewed discharge summary below. Carrie [name] is a 61 y.o.  right-handed female with history of hypertension hyperlipidemia depression, quit smoking 7 years ago.  Patient lives alone.  Independent prior to admission.  Presented to [facility] 03/26/2021 with recent fall 3 weeks ago after missing a step without loss of consciousness.  She also reported some generalized weakness and lightheadedness and dizziness on standing.  By report she had recently gone to the [place] urgent care about a week ago received Flexeril and ibuprofen.  Admission chemistries unremarkable except AST 161 ALT 114 troponin negative CK-638, lactic acid 1.8.  Cranial CT scan negative.  CT cervical spine unremarkable.  X-rays and pelvis no acute osseous injury.  Echocardiogram with ejection fraction of 60 to 65% no wall motion abnormalities grade 1 diastolic dysfunction.  Ultrasound the abdomen gallbladder distended by sludge no gross hepatic mass.  Work-up possible rheumatoid arthritis positive ANA negative CCP mildly elevated ESR 26 and CRP 2.5 as well as elevated RA(101.4).  Outpatient rheumatology Dr.ARyal curbside consulted recommend treatment with prednisone and taper.  CK improved to 26 as well as improved LFTs.  Subcutaneous Lovenox for DVT prophylaxis.  Therapy evaluations completed due to patient decreased functional mobility was admitted for a comprehensive rehab program.    OTHER MEDICAL CONDITIONS: Rheumatoid arthritis, Hypertension, Hyperlipidemia, chronic pain, generalized weakness    REVIEW OF SYSTEMS: Full 14 system review of systems performed and negative with exception of: as noted in the HPI  ALLERGIES: Allergies  Allergen Reactions   Zoloft [Sertraline Hcl] Diarrhea and Other (See Comments)    Stomach upset     HOME MEDICATIONS: Outpatient Medications Prior to Visit  Medication Sig Dispense Refill  acetaminophen (TYLENOL) 325 MG tablet Take 2 tablets (650 mg total) by mouth every 6 (six) hours as needed for mild pain (or Fever >/= 101).      ARIPiprazole (ABILIFY) 5 MG tablet Take 5 mg by mouth daily.     buPROPion (WELLBUTRIN XL) 300 MG 24 hr tablet Take 1 tablet (300 mg total) by mouth daily. 90 tablet 1   escitalopram (LEXAPRO) 20 MG tablet Take 1 tablet (20 mg total) by mouth daily. 90 tablet 1   fluticasone (FLONASE) 50 MCG/ACT nasal spray Place 2 sprays into both nostrils daily. (Patient taking differently: Place 2 sprays into both nostrils daily as needed for allergies.) 16 g 6   lisinopril (ZESTRIL) 20 MG tablet Take 1 tablet (20 mg total) by mouth daily. 90 tablet 3   loratadine (CLARITIN) 10 MG tablet Take 1 tablet (10 mg total) by mouth daily. 30 tablet 11   pantoprazole (PROTONIX) 40 MG tablet Take 1 tablet (40 mg total) by mouth daily. 90 tablet 3   predniSONE (DELTASONE) 10 MG tablet Take 40 mg by mouth daily.     senna-docusate (SENOKOT-S) 8.6-50 MG tablet Take 1 tablet by mouth 2 (two) times daily.     traMADol (ULTRAM) 50 MG tablet Take 1 tablet (50 mg total) by mouth every 12 (twelve) hours as needed for moderate pain. 30 tablet 0   Vitamin D3 (VITAMIN D) 25 MCG tablet Take 1 tablet (1,000 Units total) by mouth daily. 90 tablet 1   ARIPiprazole (ABILIFY) 5 MG tablet Take 1 tablet (5 mg total) by mouth at bedtime. (Patient not taking: Reported on 06/23/2021) 30 tablet 2   No facility-administered medications prior to visit.    PAST MEDICAL HISTORY: Past Medical History:  Diagnosis Date   Hyperlipidemia    Hypertension     PAST SURGICAL HISTORY: Past Surgical History:  Procedure Laterality Date   ABDOMINAL HYSTERECTOMY     REDUCTION MAMMAPLASTY      FAMILY HISTORY: Family History  Problem Relation Age of Onset   Breast cancer Mother        deceased by 64   Breast cancer Sister 81   Diabetes Sister    Breast cancer Other    Coronary artery disease Other    Hypertension Other     SOCIAL HISTORY: Social History   Socioeconomic History   Marital status: Single    Spouse name: Not on file    Number of children: Not on file   Years of education: Not on file   Highest education level: Not on file  Occupational History   Not on file  Tobacco Use   Smoking status: Former    Types: Cigarettes    Quit date: 02/05/2014    Years since quitting: 7.3   Smokeless tobacco: Never   Tobacco comments:    1 cig every 4-5 days ---weaning off  Vaping Use   Vaping Use: Unknown  Substance and Sexual Activity   Alcohol use: Yes    Alcohol/week: 0.0 standard drinks   Drug use: No   Sexual activity: Not Currently    Partners: Male  Other Topics Concern   Not on file  Social History Narrative   Exercise --rare   Social Determinants of Health   Financial Resource Strain: Not on file  Food Insecurity: Not on file  Transportation Needs: Not on file  Physical Activity: Not on file  Stress: Not on file  Social Connections: Not on file  Intimate Partner Violence: Not on  file    PHYSICAL EXAM  GENERAL EXAM/CONSTITUTIONAL: Vitals:  Vitals:   06/23/21 1314  BP: 114/84  Pulse: 94  Weight: 140 lb 8 oz (63.7 kg)  Height: _0  (1.651 m)   Body mass index is 23.38 kg/m. Wt Readings from Last 3 Encounters:  06/23/21 140 lb 8 oz (63.7 kg)  04/23/21 156 lb 12 oz (71.1 kg)  04/08/21 164 lb 0.4 oz (74.4 kg)   Patient is in no distress; well developed, nourished and groomed; neck is supple  CARDIOVASCULAR: Examination of carotid arteries is normal; no carotid bruits Regular rate and rhythm, no murmurs Examination of peripheral vascular system by observation and palpation is normal  EYES: Pupils round and reactive to light, Visual fields full to confrontation, Extraocular movements intacts,   MUSCULOSKELETAL: Gait, strength, tone, movements noted in Neurologic exam below  NEUROLOGIC: MENTAL STATUS:  MMSE - [place] Exam 06/23/2021  Orientation to time 3  Orientation to Place 5  Registration 3  Attention/ Calculation 1  Recall 1  Language- name 2 objects 2   Language- repeat 1  Language- follow 3 step command 3  Language- read & follow direction 1  Write a sentence 0  Copy design 0  Copy design-comments unable to draw due to hand injury  Total score 20    CRANIAL NERVE:  2nd, 3rd, 4th, 6th - pupils equal and reactive to light, visual fields full to confrontation, extraocular muscles intact, no nystagmus 5th - facial sensation symmetric 7th - facial strength symmetric 8th - hearing intact 9th - palate elevates symmetrically, uvula midline 11th - shoulder shrug symmetric 12th - tongue protrusion midline  MOTOR:  normal bulk and tone, full strength in the BUE 4+/5 for shoulder abduction, elbow flexion/extension  In the BLE 3+ to 4- max with hip flexion 5/5 knee extension 4/5 knee flexion, 4+/5 dorsi flexion and 5/5 plantar flexion   SENSORY:  normal and symmetric to light touch, pinprick, temperature, vibration  COORDINATION:  finger-nose-finger, fine finger movements normal, there is fine tremor noted   REFLEXES:  deep tendon reflexes present and symmetric  GAIT/STATION:  Deferred, unable to ambulate independently      DIAGNOSTIC DATA (LABS, IMAGING, TESTING) - I reviewed patient records, labs, notes, testing and imaging myself where available.  Lab Results  Component Value Date   WBC 5.1 04/28/2021   HGB 10.9 (L) 04/28/2021   HCT 31.8 (L) 04/28/2021   MCV 91.4 04/28/2021   PLT 270 04/28/2021      Component Value Date/Time   NA 135 04/28/2021 0546   K 4.0 04/28/2021 0546   CL 105 04/28/2021 0546   CO2 24 04/28/2021 0546   GLUCOSE 94 04/28/2021 0546   BUN 15 04/28/2021 0546   CREATININE 0.80 04/28/2021 0546   CREATININE 1.22 (H) 09/29/2019 1513   CALCIUM 8.9 04/28/2021 0546   PROT 6.0 (L) 04/28/2021 0546   ALBUMIN 2.4 (L) 04/28/2021 0546   AST 51 (H) 04/28/2021 0546   ALT 32 04/28/2021 0546   ALKPHOS 61 04/28/2021 0546   BILITOT 0.8 04/28/2021 0546   GFRNONAA >60 04/28/2021 0546   GFRAA  08/24/2008 1731     >60        The eGFR has been calculated using the MDRD equation. This calculation has not been validated in all clinical situations. eGFR's persistently <60 mL/min signify possible Chronic Kidney Disease.   Lab Results  Component Value Date   CHOL 190 12/24/2020   HDL 47.10 12/24/2020  LDLCALC 124 (H) 12/24/2020   LDLDIRECT 213.8 08/29/2012   TRIG 92.0 12/24/2020   CHOLHDL 4 12/24/2020   Lab Results  Component Value Date   HGBA1C 6.0 11/29/2014   Lab Results  Component Value Date   VITAMINB12 7,158 (H) 03/26/2021   Lab Results  Component Value Date   TSH 2.167 04/09/2021    MRI Brain without contrast  1. No acute intracranial abnormality. 2. Mild parenchymal volume loss. 3. Mild bilateral mastoid effusion    ASSESSMENT AND PLAN  61 y.o. year old female with with past medical history of hypertension, hyperlipidemia, depression, recently diagnosed with rheumatoid arthritis who is presenting with generalized weakness, currently getting PT/OT twice weekly and also periods of confusion and memory problem.  In terms of her weakness, she has proximal weakness, there were no sensory changes on exam.  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  I will also check his her vitamin B12 level and TSH to rule out any reversible causes of generalized weakness and EMG nerve conduction study. In terms of her period of confusion and forgetfulness, her presentation is not consistent with dementia.  She did have a score a 20 out of 30 on the Mini-Mental status exam but she lost a lot of points on attention and concentration.  She reported being under a lot of stress due to her son, and to her current medical condition.  She feels also that the steroid is affecting her memory.  Again patient presentation is not consistent with dementia but likely depression.  She  had a recent MRI brain without contrast that did not show any acute stroke, no tumor, or bleeding that can explain her current condition.  I have spent a lot of time discussing her antidepression medications and need for follow-up with psychiatry for further management of ongoing depression.  I also recommended her to see a psychologist or therapist for one-to-one therapy session.  I will contact the patient after completion of all of these tests, follow-up with your primary care doctor and I also recommended her to return if worse.   1. Generalized weakness   2. Rheumatoid arthritis flare (HCC)   3. Confusion   4. Depression, unspecified depression type      Patient Instructions  Continue current medications  Follow up with your PCP for referral to psychiatry  Follow up with rheumatology  EMG/NCS  We will check a B12 level and TSH  Return as needed or sooner if worse   Orders Placed This Encounter  Procedures   Vitamin B12   TSH   NCV with EMG(electromyography)    No orders of the defined types were placed in this encounter.   Return if symptoms worsen or fail to improve.    Alric Ran, MD 06/23/2021, 5:03 PM  [facility] [address] [place], [place] [address] [phone]

## 2021-06-23 NOTE — Patient Instructions (Addendum)
Continue current medications  Follow up with your PCP for referral to psychiatry  Follow up with rheumatology  EMG/NCS  We will check a B12 level and TSH  Return as needed or sooner if worse

## 2021-06-24 ENCOUNTER — Telehealth: Payer: Self-pay | Admitting: *Deleted

## 2021-06-24 ENCOUNTER — Ambulatory Visit
Admission: RE | Admit: 2021-06-24 | Discharge: 2021-06-24 | Disposition: A | Discharge status: Home / Self Care | Payer: Federal, State, Local not specified - PPO | Source: Ambulatory Visit | Attending: Rheumatology | Admitting: Rheumatology

## 2021-06-24 DIAGNOSIS — J984 Other disorders of lung: Secondary | ICD-10-CM | POA: Diagnosis not present

## 2021-06-24 DIAGNOSIS — R918 Other nonspecific abnormal finding of lung field: Secondary | ICD-10-CM | POA: Diagnosis not present

## 2021-06-24 DIAGNOSIS — M3390 Dermatopolymyositis, unspecified, organ involvement unspecified: Secondary | ICD-10-CM

## 2021-06-24 DIAGNOSIS — J84112 Idiopathic pulmonary fibrosis: Secondary | ICD-10-CM | POA: Diagnosis not present

## 2021-06-24 LAB — VITAMIN B12: Vitamin B-12: 1385 pg/mL — ABNORMAL HIGH (ref 232–1245)

## 2021-06-24 LAB — TSH: TSH: 2.47 u[IU]/mL (ref 0.450–4.500)

## 2021-06-24 IMAGING — CT CT CHEST HIGH RESOLUTION
2 of 8 series · 13 of 36 positions shown, 16 images · non-contrast
Comparison: None.

CLINICAL DATA: Dermatomyositis, former smoker



[Series 4: chest 2.00 br36 s3 cor soft · coronal · 0.54mm/px · 3 of 168 slices shown]
[im 34/168  lung]
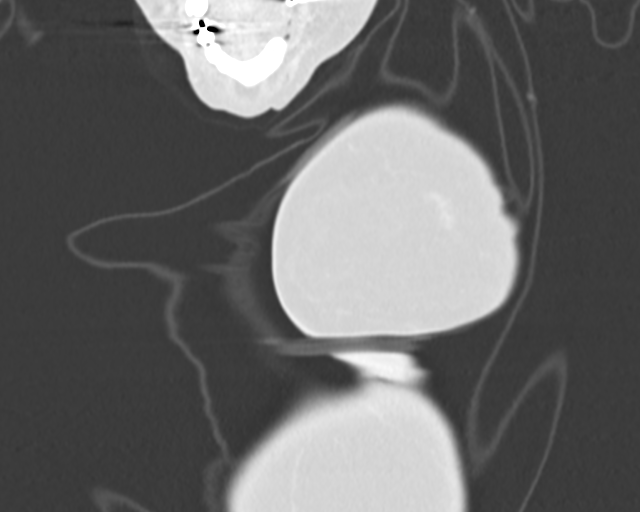
[im 67/168  lung]
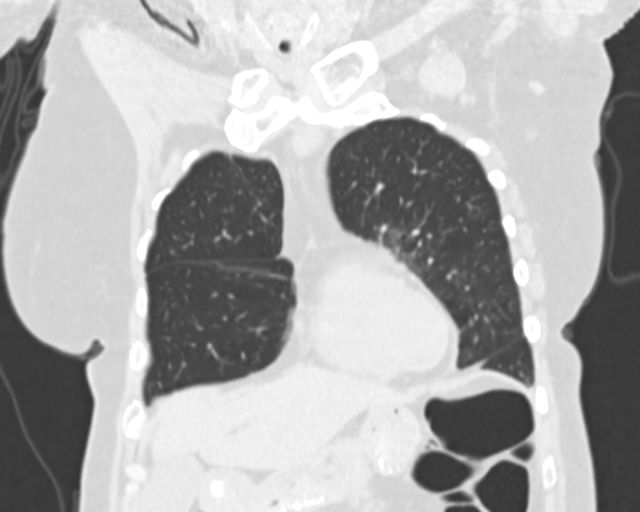
[im 101/168  lung]
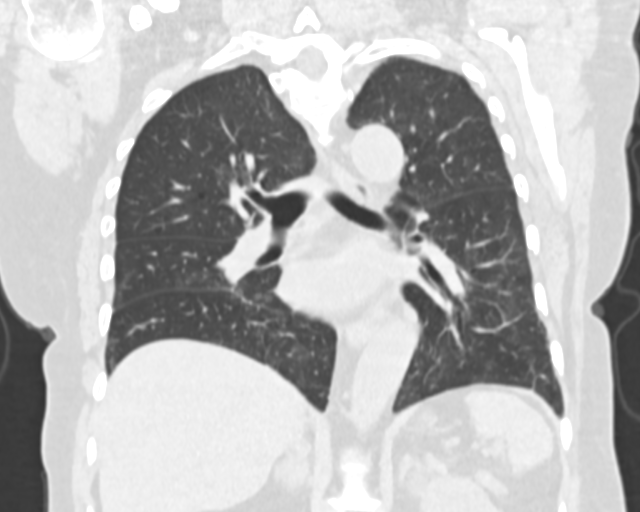

[Series 11: chest 1.00 br60 s3 high res thins 1x1 mm · axial · 0.61mm/px · z∈[+1373,+1588]mm · 10 of 259 slices shown, 13 images]
[im 22/259  mediastinal]
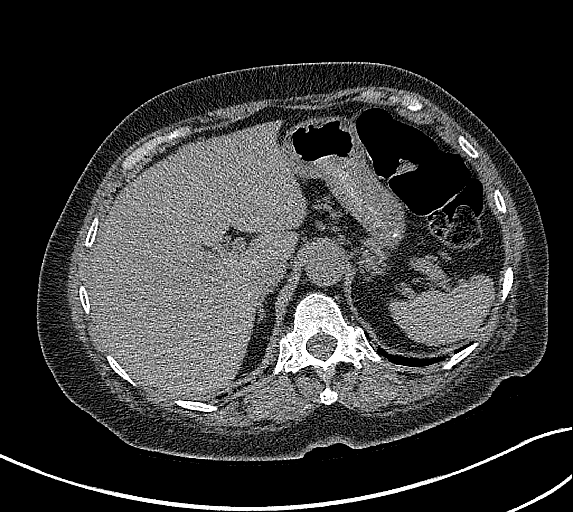
[im 22/259  lung]
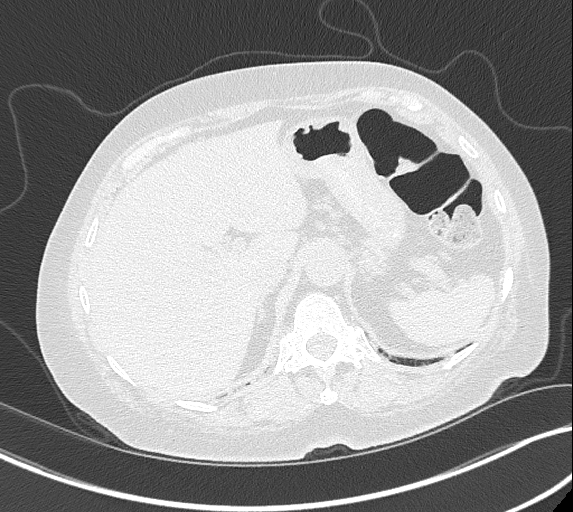
[im 44/259  lung]
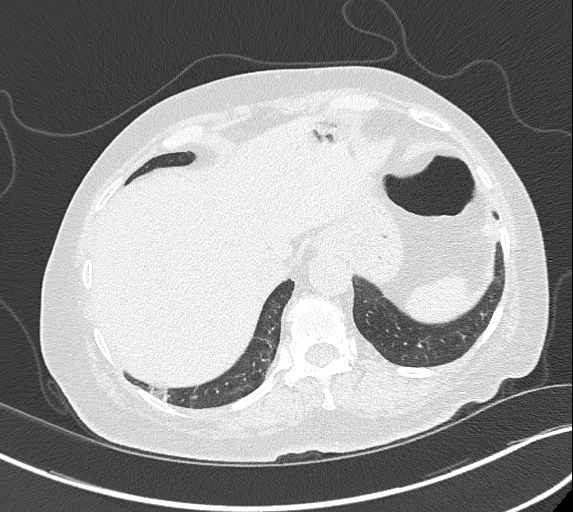
[im 65/259  lung]
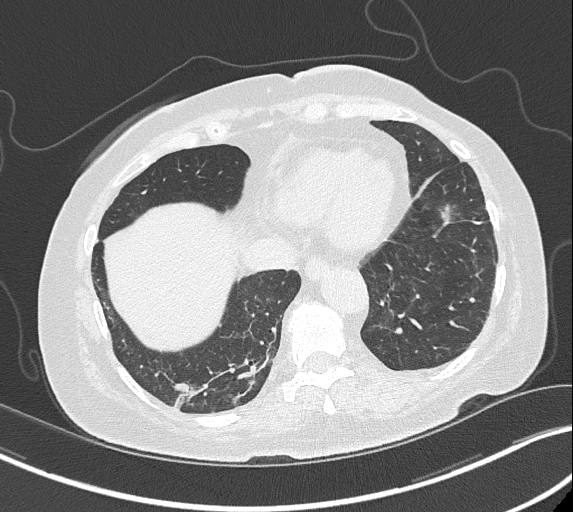
[im 87/259  lung]
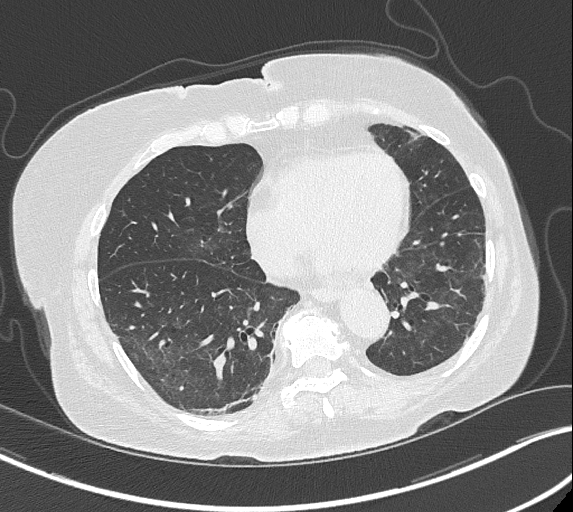
[im 108/259  mediastinal]
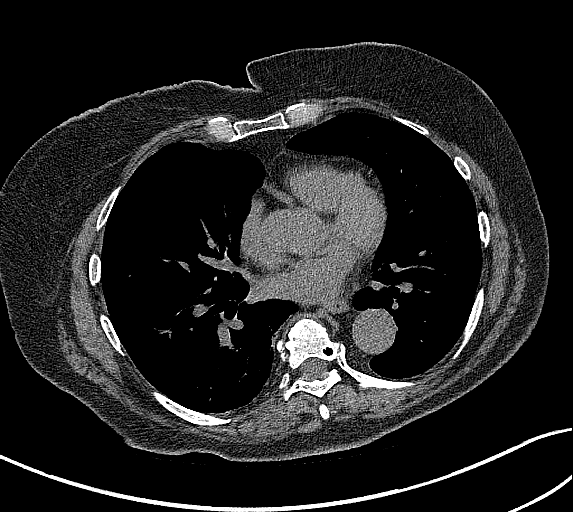
[im 108/259  lung]
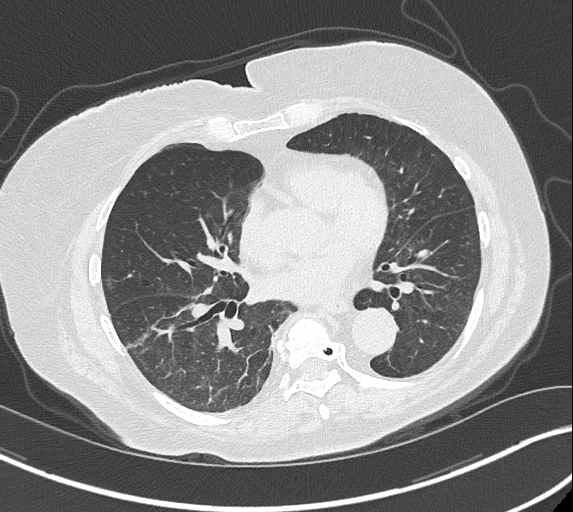
[im 151/259  lung]
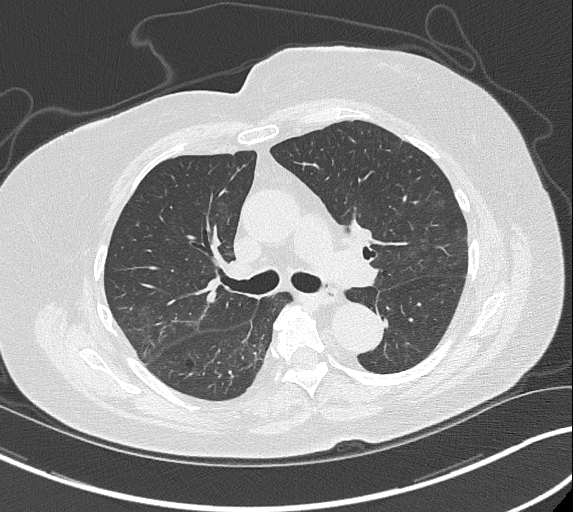
[im 173/259  lung]
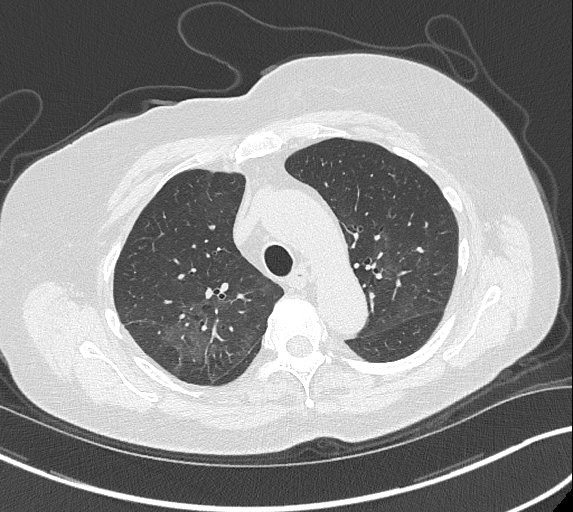
[im 194/259  lung]
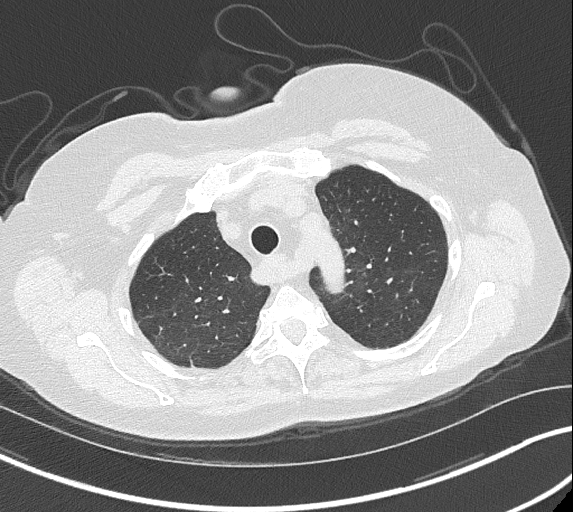
[im 216/259  mediastinal]
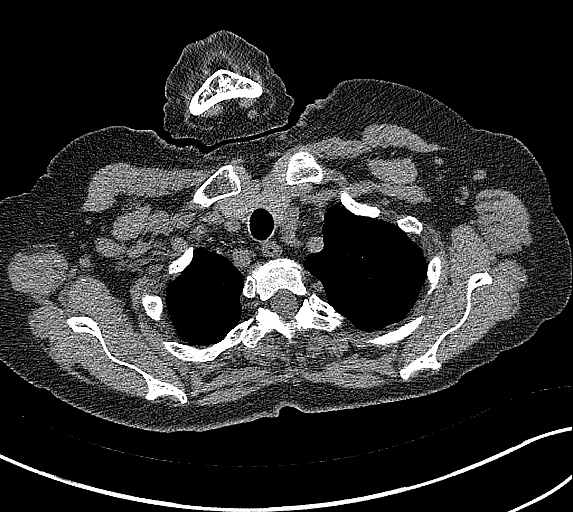
[im 216/259  lung]
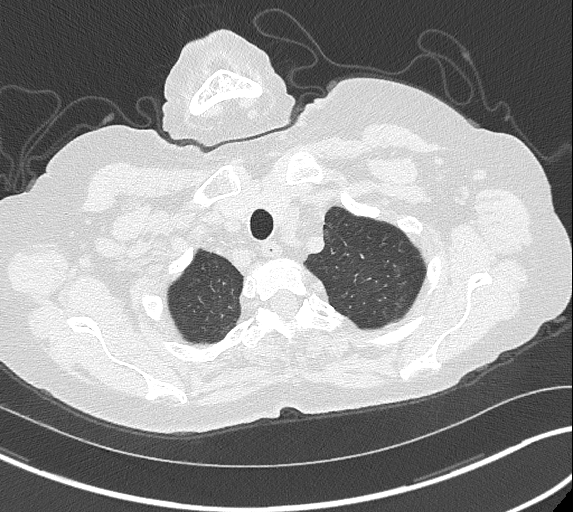
[im 237/259  lung]
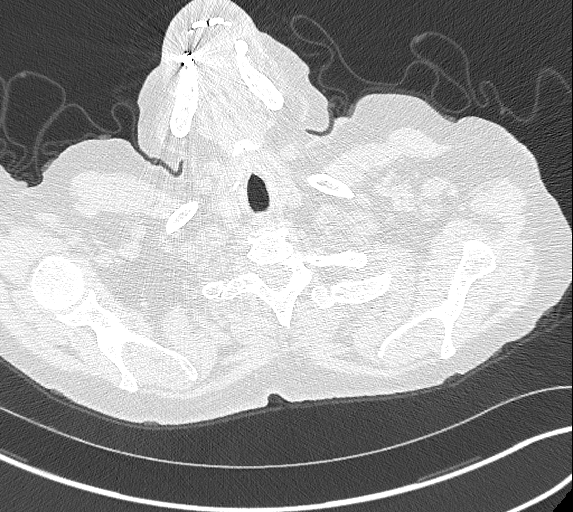

[13 of 36 positions shown; findings below may reference images not displayed]

FINDINGS: Cardiovascular: Incidental note of aberrant retroesophageal origin
of the right subclavian artery. Normal heart size. No pericardial
effusion.

Mediastinum/Nodes: No enlarged mediastinal, hilar, or axillary lymph
nodes. Thyroid gland, trachea, and esophagus demonstrate no
significant findings.

Lungs/Pleura: Bibasilar predominant pattern of ground-glass opacity,
some irregular peripheral interstitial opacity, and generally
bandlike elements of scarring in the right greater than left lung
bases (series 11, image 84, 196). No significant air trapping on
expiratory phase imaging. No pleural effusion or pneumothorax.

Upper Abdomen: No acute abnormality. Benign, fat containing left
adrenal adenoma (series 2, image 132). Tiny gallstones in the
gallbladder.

Musculoskeletal: No chest wall abnormality. No suspicious osseous
lesions identified.
IMPRESSION: 1. Bibasilar predominant pattern of ground-glass opacity, some
irregular peripheral interstitial opacity, and generally bandlike
elements of scarring in the right greater than left lung bases. No
significant air trapping on expiratory phase imaging. Findings
generally suggest nonspecific sequelae of prior infection or
inflammation, however if strictly characterized by ATS pulmonary
fibrosis criteria, are in an alternative diagnosis (not UIP) per
consensus guidelines, potentially in keeping with connective tissue
disorder associated fibrosis: Diagnosis of Idiopathic Pulmonary
Fibrosis: An Official ATS/ERS/JRS/ALAT Clinical Practice Guideline.
Am J Respir Crit Care Med Vol 198, [place] 5, [QB]-e[DATE].
2. Cholelithiasis.

## 2021-06-24 NOTE — Telephone Encounter (Signed)
Called patient to inform of lab results. Patient verbalized understanding and expressed appreciation for the call. Informed of upcoming appointments. All questions answered.

## 2021-06-24 NOTE — Progress Notes (Signed)
Please call and advise the patient that her recent vitamin B12 level, and thyroid function were within normal limits. No further action is required on these tests at this time. Please remind patient to keep any upcoming appointments or tests and to call us with any interim questions, concerns, problems or updates. Thanks,   Windell Norfolk, MD

## 2021-06-24 NOTE — Telephone Encounter (Signed)
-----   Message from Windell Norfolk, MD sent at 06/24/2021  9:10 AM EST ----- Please call and advise the patient that her recent vitamin B12 level, and thyroid function were within normal limits. No further action is required on these tests at this time. Please remind patient to keep any upcoming appointments or tests and to call us with any interim questions, concerns, problems or updates. Thanks,   Windell Norfolk, MD

## 2021-06-25 DIAGNOSIS — I10 Essential (primary) hypertension: Secondary | ICD-10-CM | POA: Diagnosis not present

## 2021-06-25 DIAGNOSIS — M069 Rheumatoid arthritis, unspecified: Secondary | ICD-10-CM | POA: Diagnosis not present

## 2021-06-25 DIAGNOSIS — Z79899 Other long term (current) drug therapy: Secondary | ICD-10-CM | POA: Diagnosis not present

## 2021-06-25 DIAGNOSIS — F32A Depression, unspecified: Secondary | ICD-10-CM | POA: Diagnosis not present

## 2021-06-25 DIAGNOSIS — E785 Hyperlipidemia, unspecified: Secondary | ICD-10-CM | POA: Diagnosis not present

## 2021-06-25 DIAGNOSIS — Z9181 History of falling: Secondary | ICD-10-CM | POA: Diagnosis not present

## 2021-06-25 DIAGNOSIS — Z87891 Personal history of nicotine dependence: Secondary | ICD-10-CM | POA: Diagnosis not present

## 2021-06-25 DIAGNOSIS — E871 Hypo-osmolality and hyponatremia: Secondary | ICD-10-CM | POA: Diagnosis not present

## 2021-06-25 DIAGNOSIS — R5381 Other malaise: Secondary | ICD-10-CM | POA: Diagnosis not present

## 2021-06-26 ENCOUNTER — Telehealth: Payer: Self-pay | Admitting: Family Medicine

## 2021-06-26 DIAGNOSIS — R5381 Other malaise: Secondary | ICD-10-CM | POA: Diagnosis not present

## 2021-06-26 DIAGNOSIS — Z9181 History of falling: Secondary | ICD-10-CM | POA: Diagnosis not present

## 2021-06-26 DIAGNOSIS — Z79899 Other long term (current) drug therapy: Secondary | ICD-10-CM | POA: Diagnosis not present

## 2021-06-26 DIAGNOSIS — E785 Hyperlipidemia, unspecified: Secondary | ICD-10-CM | POA: Diagnosis not present

## 2021-06-26 DIAGNOSIS — I10 Essential (primary) hypertension: Secondary | ICD-10-CM | POA: Diagnosis not present

## 2021-06-26 DIAGNOSIS — M069 Rheumatoid arthritis, unspecified: Secondary | ICD-10-CM | POA: Diagnosis not present

## 2021-06-26 DIAGNOSIS — F32A Depression, unspecified: Secondary | ICD-10-CM | POA: Diagnosis not present

## 2021-06-26 DIAGNOSIS — E871 Hypo-osmolality and hyponatremia: Secondary | ICD-10-CM | POA: Diagnosis not present

## 2021-06-26 DIAGNOSIS — Z87891 Personal history of nicotine dependence: Secondary | ICD-10-CM | POA: Diagnosis not present

## 2021-06-26 NOTE — Telephone Encounter (Signed)
Caller/Agency: [facility]  Callback Number: [phone] ok for VM  Wound care for hand orders:  VV to apply meta honey to wound

## 2021-06-27 ENCOUNTER — Telehealth: Payer: Self-pay

## 2021-06-27 DIAGNOSIS — E871 Hypo-osmolality and hyponatremia: Secondary | ICD-10-CM | POA: Diagnosis not present

## 2021-06-27 DIAGNOSIS — I10 Essential (primary) hypertension: Secondary | ICD-10-CM | POA: Diagnosis not present

## 2021-06-27 DIAGNOSIS — Z9181 History of falling: Secondary | ICD-10-CM | POA: Diagnosis not present

## 2021-06-27 DIAGNOSIS — M069 Rheumatoid arthritis, unspecified: Secondary | ICD-10-CM | POA: Diagnosis not present

## 2021-06-27 DIAGNOSIS — Z87891 Personal history of nicotine dependence: Secondary | ICD-10-CM | POA: Diagnosis not present

## 2021-06-27 DIAGNOSIS — Z79899 Other long term (current) drug therapy: Secondary | ICD-10-CM | POA: Diagnosis not present

## 2021-06-27 DIAGNOSIS — E785 Hyperlipidemia, unspecified: Secondary | ICD-10-CM | POA: Diagnosis not present

## 2021-06-27 DIAGNOSIS — F32A Depression, unspecified: Secondary | ICD-10-CM | POA: Diagnosis not present

## 2021-06-27 DIAGNOSIS — R5381 Other malaise: Secondary | ICD-10-CM | POA: Diagnosis not present

## 2021-06-27 NOTE — Telephone Encounter (Signed)
Beth with [facility] called to report patient fell in her bathroom yesterday morning and her niece found her.  Pt denied the fall.  There were no reported injuries.  Beth's CB # is [phone].

## 2021-06-27 NOTE — Telephone Encounter (Signed)
Verbal given 

## 2021-06-30 DIAGNOSIS — I10 Essential (primary) hypertension: Secondary | ICD-10-CM | POA: Diagnosis not present

## 2021-06-30 DIAGNOSIS — Z9181 History of falling: Secondary | ICD-10-CM | POA: Diagnosis not present

## 2021-06-30 DIAGNOSIS — M069 Rheumatoid arthritis, unspecified: Secondary | ICD-10-CM | POA: Diagnosis not present

## 2021-06-30 DIAGNOSIS — Z87891 Personal history of nicotine dependence: Secondary | ICD-10-CM | POA: Diagnosis not present

## 2021-06-30 DIAGNOSIS — E785 Hyperlipidemia, unspecified: Secondary | ICD-10-CM | POA: Diagnosis not present

## 2021-06-30 DIAGNOSIS — F32A Depression, unspecified: Secondary | ICD-10-CM | POA: Diagnosis not present

## 2021-06-30 DIAGNOSIS — R5381 Other malaise: Secondary | ICD-10-CM | POA: Diagnosis not present

## 2021-06-30 DIAGNOSIS — Z79899 Other long term (current) drug therapy: Secondary | ICD-10-CM | POA: Diagnosis not present

## 2021-06-30 DIAGNOSIS — E871 Hypo-osmolality and hyponatremia: Secondary | ICD-10-CM | POA: Diagnosis not present

## 2021-06-30 NOTE — Telephone Encounter (Signed)
FYI

## 2021-07-01 ENCOUNTER — Telehealth: Payer: Self-pay | Admitting: Family Medicine

## 2021-07-01 DIAGNOSIS — Z79899 Other long term (current) drug therapy: Secondary | ICD-10-CM | POA: Diagnosis not present

## 2021-07-01 DIAGNOSIS — I10 Essential (primary) hypertension: Secondary | ICD-10-CM | POA: Diagnosis not present

## 2021-07-01 DIAGNOSIS — R5381 Other malaise: Secondary | ICD-10-CM | POA: Diagnosis not present

## 2021-07-01 DIAGNOSIS — Z87891 Personal history of nicotine dependence: Secondary | ICD-10-CM | POA: Diagnosis not present

## 2021-07-01 DIAGNOSIS — E785 Hyperlipidemia, unspecified: Secondary | ICD-10-CM | POA: Diagnosis not present

## 2021-07-01 DIAGNOSIS — E871 Hypo-osmolality and hyponatremia: Secondary | ICD-10-CM | POA: Diagnosis not present

## 2021-07-01 DIAGNOSIS — Z9181 History of falling: Secondary | ICD-10-CM | POA: Diagnosis not present

## 2021-07-01 DIAGNOSIS — M069 Rheumatoid arthritis, unspecified: Secondary | ICD-10-CM | POA: Diagnosis not present

## 2021-07-01 DIAGNOSIS — F32A Depression, unspecified: Secondary | ICD-10-CM | POA: Diagnosis not present

## 2021-07-01 NOTE — Telephone Encounter (Signed)
Caller/Agency: [facility] Number: [phone] , VM ok Requesting OT/PT/Skilled Nursing/Social Work/Speech Therapy: PT Frequency: 2 w 3, 1 w 1

## 2021-07-02 NOTE — Telephone Encounter (Signed)
Verbal given 

## 2021-07-03 DIAGNOSIS — R5381 Other malaise: Secondary | ICD-10-CM | POA: Diagnosis not present

## 2021-07-03 DIAGNOSIS — F322 Major depressive disorder, single episode, severe without psychotic features: Secondary | ICD-10-CM | POA: Diagnosis not present

## 2021-07-03 DIAGNOSIS — M069 Rheumatoid arthritis, unspecified: Secondary | ICD-10-CM | POA: Diagnosis not present

## 2021-07-07 DIAGNOSIS — R5381 Other malaise: Secondary | ICD-10-CM | POA: Diagnosis not present

## 2021-07-07 DIAGNOSIS — E871 Hypo-osmolality and hyponatremia: Secondary | ICD-10-CM | POA: Diagnosis not present

## 2021-07-07 DIAGNOSIS — Z79899 Other long term (current) drug therapy: Secondary | ICD-10-CM | POA: Diagnosis not present

## 2021-07-07 DIAGNOSIS — Z7952 Long term (current) use of systemic steroids: Secondary | ICD-10-CM | POA: Diagnosis not present

## 2021-07-07 DIAGNOSIS — F32A Depression, unspecified: Secondary | ICD-10-CM | POA: Diagnosis not present

## 2021-07-07 DIAGNOSIS — Z9181 History of falling: Secondary | ICD-10-CM | POA: Diagnosis not present

## 2021-07-07 DIAGNOSIS — I10 Essential (primary) hypertension: Secondary | ICD-10-CM | POA: Diagnosis not present

## 2021-07-07 DIAGNOSIS — E785 Hyperlipidemia, unspecified: Secondary | ICD-10-CM | POA: Diagnosis not present

## 2021-07-07 DIAGNOSIS — M069 Rheumatoid arthritis, unspecified: Secondary | ICD-10-CM | POA: Diagnosis not present

## 2021-07-07 DIAGNOSIS — Z87891 Personal history of nicotine dependence: Secondary | ICD-10-CM | POA: Diagnosis not present

## 2021-07-09 DIAGNOSIS — Z7952 Long term (current) use of systemic steroids: Secondary | ICD-10-CM | POA: Diagnosis not present

## 2021-07-09 DIAGNOSIS — M069 Rheumatoid arthritis, unspecified: Secondary | ICD-10-CM | POA: Diagnosis not present

## 2021-07-09 DIAGNOSIS — Z79899 Other long term (current) drug therapy: Secondary | ICD-10-CM | POA: Diagnosis not present

## 2021-07-09 DIAGNOSIS — Z87891 Personal history of nicotine dependence: Secondary | ICD-10-CM | POA: Diagnosis not present

## 2021-07-09 DIAGNOSIS — R5381 Other malaise: Secondary | ICD-10-CM | POA: Diagnosis not present

## 2021-07-09 DIAGNOSIS — F32A Depression, unspecified: Secondary | ICD-10-CM | POA: Diagnosis not present

## 2021-07-09 DIAGNOSIS — E871 Hypo-osmolality and hyponatremia: Secondary | ICD-10-CM | POA: Diagnosis not present

## 2021-07-09 DIAGNOSIS — E785 Hyperlipidemia, unspecified: Secondary | ICD-10-CM | POA: Diagnosis not present

## 2021-07-09 DIAGNOSIS — I10 Essential (primary) hypertension: Secondary | ICD-10-CM | POA: Diagnosis not present

## 2021-07-09 DIAGNOSIS — Z9181 History of falling: Secondary | ICD-10-CM | POA: Diagnosis not present

## 2021-07-10 DIAGNOSIS — R5381 Other malaise: Secondary | ICD-10-CM | POA: Diagnosis not present

## 2021-07-10 DIAGNOSIS — Z79899 Other long term (current) drug therapy: Secondary | ICD-10-CM | POA: Diagnosis not present

## 2021-07-10 DIAGNOSIS — Z9181 History of falling: Secondary | ICD-10-CM | POA: Diagnosis not present

## 2021-07-10 DIAGNOSIS — I10 Essential (primary) hypertension: Secondary | ICD-10-CM | POA: Diagnosis not present

## 2021-07-10 DIAGNOSIS — E871 Hypo-osmolality and hyponatremia: Secondary | ICD-10-CM | POA: Diagnosis not present

## 2021-07-10 DIAGNOSIS — M069 Rheumatoid arthritis, unspecified: Secondary | ICD-10-CM | POA: Diagnosis not present

## 2021-07-10 DIAGNOSIS — Z87891 Personal history of nicotine dependence: Secondary | ICD-10-CM | POA: Diagnosis not present

## 2021-07-10 DIAGNOSIS — E785 Hyperlipidemia, unspecified: Secondary | ICD-10-CM | POA: Diagnosis not present

## 2021-07-10 DIAGNOSIS — Z7952 Long term (current) use of systemic steroids: Secondary | ICD-10-CM | POA: Diagnosis not present

## 2021-07-10 DIAGNOSIS — F32A Depression, unspecified: Secondary | ICD-10-CM | POA: Diagnosis not present

## 2021-07-14 DIAGNOSIS — R5381 Other malaise: Secondary | ICD-10-CM | POA: Diagnosis not present

## 2021-07-14 DIAGNOSIS — M069 Rheumatoid arthritis, unspecified: Secondary | ICD-10-CM | POA: Diagnosis not present

## 2021-07-14 DIAGNOSIS — Z79899 Other long term (current) drug therapy: Secondary | ICD-10-CM | POA: Diagnosis not present

## 2021-07-14 DIAGNOSIS — F32A Depression, unspecified: Secondary | ICD-10-CM | POA: Diagnosis not present

## 2021-07-14 DIAGNOSIS — Z87891 Personal history of nicotine dependence: Secondary | ICD-10-CM | POA: Diagnosis not present

## 2021-07-14 DIAGNOSIS — Z9181 History of falling: Secondary | ICD-10-CM | POA: Diagnosis not present

## 2021-07-14 DIAGNOSIS — Z7952 Long term (current) use of systemic steroids: Secondary | ICD-10-CM | POA: Diagnosis not present

## 2021-07-14 DIAGNOSIS — E785 Hyperlipidemia, unspecified: Secondary | ICD-10-CM | POA: Diagnosis not present

## 2021-07-14 DIAGNOSIS — E871 Hypo-osmolality and hyponatremia: Secondary | ICD-10-CM | POA: Diagnosis not present

## 2021-07-14 DIAGNOSIS — I10 Essential (primary) hypertension: Secondary | ICD-10-CM | POA: Diagnosis not present

## 2021-07-15 DIAGNOSIS — I10 Essential (primary) hypertension: Secondary | ICD-10-CM | POA: Diagnosis not present

## 2021-07-15 DIAGNOSIS — Z9181 History of falling: Secondary | ICD-10-CM | POA: Diagnosis not present

## 2021-07-15 DIAGNOSIS — Z87891 Personal history of nicotine dependence: Secondary | ICD-10-CM | POA: Diagnosis not present

## 2021-07-15 DIAGNOSIS — M069 Rheumatoid arthritis, unspecified: Secondary | ICD-10-CM | POA: Diagnosis not present

## 2021-07-15 DIAGNOSIS — F32A Depression, unspecified: Secondary | ICD-10-CM | POA: Diagnosis not present

## 2021-07-15 DIAGNOSIS — Z7952 Long term (current) use of systemic steroids: Secondary | ICD-10-CM | POA: Diagnosis not present

## 2021-07-15 DIAGNOSIS — E785 Hyperlipidemia, unspecified: Secondary | ICD-10-CM | POA: Diagnosis not present

## 2021-07-15 DIAGNOSIS — E871 Hypo-osmolality and hyponatremia: Secondary | ICD-10-CM | POA: Diagnosis not present

## 2021-07-15 DIAGNOSIS — R5381 Other malaise: Secondary | ICD-10-CM | POA: Diagnosis not present

## 2021-07-15 DIAGNOSIS — Z79899 Other long term (current) drug therapy: Secondary | ICD-10-CM | POA: Diagnosis not present

## 2021-07-15 NOTE — Telephone Encounter (Signed)
Verbal given 

## 2021-07-15 NOTE — Telephone Encounter (Signed)
[facility] Called to extend PT for the patient to : 2 w 3, 1 w 1.  Verbal orders call: [phone]

## 2021-07-16 DIAGNOSIS — K760 Fatty (change of) liver, not elsewhere classified: Secondary | ICD-10-CM | POA: Diagnosis not present

## 2021-07-16 DIAGNOSIS — Z79899 Other long term (current) drug therapy: Secondary | ICD-10-CM | POA: Diagnosis not present

## 2021-07-16 DIAGNOSIS — M3390 Dermatopolymyositis, unspecified, organ involvement unspecified: Secondary | ICD-10-CM | POA: Diagnosis not present

## 2021-07-16 DIAGNOSIS — Z87891 Personal history of nicotine dependence: Secondary | ICD-10-CM | POA: Diagnosis not present

## 2021-07-16 DIAGNOSIS — E785 Hyperlipidemia, unspecified: Secondary | ICD-10-CM | POA: Diagnosis not present

## 2021-07-16 DIAGNOSIS — F32A Depression, unspecified: Secondary | ICD-10-CM | POA: Diagnosis not present

## 2021-07-16 DIAGNOSIS — R5381 Other malaise: Secondary | ICD-10-CM | POA: Diagnosis not present

## 2021-07-16 DIAGNOSIS — I10 Essential (primary) hypertension: Secondary | ICD-10-CM | POA: Diagnosis not present

## 2021-07-16 DIAGNOSIS — E871 Hypo-osmolality and hyponatremia: Secondary | ICD-10-CM | POA: Diagnosis not present

## 2021-07-16 DIAGNOSIS — Z7952 Long term (current) use of systemic steroids: Secondary | ICD-10-CM | POA: Diagnosis not present

## 2021-07-16 DIAGNOSIS — M069 Rheumatoid arthritis, unspecified: Secondary | ICD-10-CM | POA: Diagnosis not present

## 2021-07-16 DIAGNOSIS — M0579 Rheumatoid arthritis with rheumatoid factor of multiple sites without organ or systems involvement: Secondary | ICD-10-CM | POA: Diagnosis not present

## 2021-07-16 DIAGNOSIS — Z9181 History of falling: Secondary | ICD-10-CM | POA: Diagnosis not present

## 2021-07-17 DIAGNOSIS — E785 Hyperlipidemia, unspecified: Secondary | ICD-10-CM | POA: Diagnosis not present

## 2021-07-17 DIAGNOSIS — Z79899 Other long term (current) drug therapy: Secondary | ICD-10-CM | POA: Diagnosis not present

## 2021-07-17 DIAGNOSIS — Z7952 Long term (current) use of systemic steroids: Secondary | ICD-10-CM | POA: Diagnosis not present

## 2021-07-17 DIAGNOSIS — R5381 Other malaise: Secondary | ICD-10-CM | POA: Diagnosis not present

## 2021-07-17 DIAGNOSIS — I10 Essential (primary) hypertension: Secondary | ICD-10-CM | POA: Diagnosis not present

## 2021-07-17 DIAGNOSIS — E871 Hypo-osmolality and hyponatremia: Secondary | ICD-10-CM | POA: Diagnosis not present

## 2021-07-17 DIAGNOSIS — Z87891 Personal history of nicotine dependence: Secondary | ICD-10-CM | POA: Diagnosis not present

## 2021-07-17 DIAGNOSIS — F32A Depression, unspecified: Secondary | ICD-10-CM | POA: Diagnosis not present

## 2021-07-17 DIAGNOSIS — Z9181 History of falling: Secondary | ICD-10-CM | POA: Diagnosis not present

## 2021-07-17 DIAGNOSIS — M069 Rheumatoid arthritis, unspecified: Secondary | ICD-10-CM | POA: Diagnosis not present

## 2021-07-21 DIAGNOSIS — M069 Rheumatoid arthritis, unspecified: Secondary | ICD-10-CM | POA: Diagnosis not present

## 2021-07-21 DIAGNOSIS — E785 Hyperlipidemia, unspecified: Secondary | ICD-10-CM | POA: Diagnosis not present

## 2021-07-21 DIAGNOSIS — Z9181 History of falling: Secondary | ICD-10-CM | POA: Diagnosis not present

## 2021-07-21 DIAGNOSIS — R5381 Other malaise: Secondary | ICD-10-CM | POA: Diagnosis not present

## 2021-07-21 DIAGNOSIS — Z87891 Personal history of nicotine dependence: Secondary | ICD-10-CM | POA: Diagnosis not present

## 2021-07-21 DIAGNOSIS — I10 Essential (primary) hypertension: Secondary | ICD-10-CM | POA: Diagnosis not present

## 2021-07-21 DIAGNOSIS — Z79899 Other long term (current) drug therapy: Secondary | ICD-10-CM | POA: Diagnosis not present

## 2021-07-21 DIAGNOSIS — F32A Depression, unspecified: Secondary | ICD-10-CM | POA: Diagnosis not present

## 2021-07-21 DIAGNOSIS — E871 Hypo-osmolality and hyponatremia: Secondary | ICD-10-CM | POA: Diagnosis not present

## 2021-07-21 DIAGNOSIS — Z7952 Long term (current) use of systemic steroids: Secondary | ICD-10-CM | POA: Diagnosis not present

## 2021-07-24 DIAGNOSIS — M069 Rheumatoid arthritis, unspecified: Secondary | ICD-10-CM | POA: Diagnosis not present

## 2021-07-24 DIAGNOSIS — E871 Hypo-osmolality and hyponatremia: Secondary | ICD-10-CM | POA: Diagnosis not present

## 2021-07-24 DIAGNOSIS — I10 Essential (primary) hypertension: Secondary | ICD-10-CM | POA: Diagnosis not present

## 2021-07-24 DIAGNOSIS — Z7952 Long term (current) use of systemic steroids: Secondary | ICD-10-CM | POA: Diagnosis not present

## 2021-07-24 DIAGNOSIS — Z9181 History of falling: Secondary | ICD-10-CM | POA: Diagnosis not present

## 2021-07-24 DIAGNOSIS — R5381 Other malaise: Secondary | ICD-10-CM | POA: Diagnosis not present

## 2021-07-24 DIAGNOSIS — Z87891 Personal history of nicotine dependence: Secondary | ICD-10-CM | POA: Diagnosis not present

## 2021-07-24 DIAGNOSIS — F32A Depression, unspecified: Secondary | ICD-10-CM | POA: Diagnosis not present

## 2021-07-24 DIAGNOSIS — Z79899 Other long term (current) drug therapy: Secondary | ICD-10-CM | POA: Diagnosis not present

## 2021-07-24 DIAGNOSIS — E785 Hyperlipidemia, unspecified: Secondary | ICD-10-CM | POA: Diagnosis not present

## 2021-07-28 DIAGNOSIS — M069 Rheumatoid arthritis, unspecified: Secondary | ICD-10-CM | POA: Diagnosis not present

## 2021-07-28 DIAGNOSIS — Z79899 Other long term (current) drug therapy: Secondary | ICD-10-CM | POA: Diagnosis not present

## 2021-07-28 DIAGNOSIS — E785 Hyperlipidemia, unspecified: Secondary | ICD-10-CM | POA: Diagnosis not present

## 2021-07-28 DIAGNOSIS — Z87891 Personal history of nicotine dependence: Secondary | ICD-10-CM | POA: Diagnosis not present

## 2021-07-28 DIAGNOSIS — Z9181 History of falling: Secondary | ICD-10-CM | POA: Diagnosis not present

## 2021-07-28 DIAGNOSIS — R5381 Other malaise: Secondary | ICD-10-CM | POA: Diagnosis not present

## 2021-07-28 DIAGNOSIS — F32A Depression, unspecified: Secondary | ICD-10-CM | POA: Diagnosis not present

## 2021-07-28 DIAGNOSIS — Z7952 Long term (current) use of systemic steroids: Secondary | ICD-10-CM | POA: Diagnosis not present

## 2021-07-28 DIAGNOSIS — E871 Hypo-osmolality and hyponatremia: Secondary | ICD-10-CM | POA: Diagnosis not present

## 2021-07-28 DIAGNOSIS — I10 Essential (primary) hypertension: Secondary | ICD-10-CM | POA: Diagnosis not present

## 2021-07-29 DIAGNOSIS — Z79899 Other long term (current) drug therapy: Secondary | ICD-10-CM | POA: Diagnosis not present

## 2021-07-29 DIAGNOSIS — E871 Hypo-osmolality and hyponatremia: Secondary | ICD-10-CM | POA: Diagnosis not present

## 2021-07-29 DIAGNOSIS — M069 Rheumatoid arthritis, unspecified: Secondary | ICD-10-CM | POA: Diagnosis not present

## 2021-07-29 DIAGNOSIS — F32A Depression, unspecified: Secondary | ICD-10-CM | POA: Diagnosis not present

## 2021-07-29 DIAGNOSIS — Z87891 Personal history of nicotine dependence: Secondary | ICD-10-CM | POA: Diagnosis not present

## 2021-07-29 DIAGNOSIS — Z7952 Long term (current) use of systemic steroids: Secondary | ICD-10-CM | POA: Diagnosis not present

## 2021-07-29 DIAGNOSIS — E785 Hyperlipidemia, unspecified: Secondary | ICD-10-CM | POA: Diagnosis not present

## 2021-07-29 DIAGNOSIS — R5381 Other malaise: Secondary | ICD-10-CM | POA: Diagnosis not present

## 2021-07-29 DIAGNOSIS — I10 Essential (primary) hypertension: Secondary | ICD-10-CM | POA: Diagnosis not present

## 2021-07-29 DIAGNOSIS — Z9181 History of falling: Secondary | ICD-10-CM | POA: Diagnosis not present

## 2021-07-30 DIAGNOSIS — E785 Hyperlipidemia, unspecified: Secondary | ICD-10-CM | POA: Diagnosis not present

## 2021-07-30 DIAGNOSIS — Z87891 Personal history of nicotine dependence: Secondary | ICD-10-CM | POA: Diagnosis not present

## 2021-07-30 DIAGNOSIS — R5381 Other malaise: Secondary | ICD-10-CM | POA: Diagnosis not present

## 2021-07-30 DIAGNOSIS — Z7952 Long term (current) use of systemic steroids: Secondary | ICD-10-CM | POA: Diagnosis not present

## 2021-07-30 DIAGNOSIS — M069 Rheumatoid arthritis, unspecified: Secondary | ICD-10-CM | POA: Diagnosis not present

## 2021-07-30 DIAGNOSIS — F32A Depression, unspecified: Secondary | ICD-10-CM | POA: Diagnosis not present

## 2021-07-30 DIAGNOSIS — E871 Hypo-osmolality and hyponatremia: Secondary | ICD-10-CM | POA: Diagnosis not present

## 2021-07-30 DIAGNOSIS — Z79899 Other long term (current) drug therapy: Secondary | ICD-10-CM | POA: Diagnosis not present

## 2021-07-30 DIAGNOSIS — I10 Essential (primary) hypertension: Secondary | ICD-10-CM | POA: Diagnosis not present

## 2021-07-30 DIAGNOSIS — Z9181 History of falling: Secondary | ICD-10-CM | POA: Diagnosis not present

## 2021-07-31 ENCOUNTER — Telehealth: Payer: Self-pay | Admitting: Family Medicine

## 2021-07-31 NOTE — Telephone Encounter (Signed)
Caller/Agency: [facility] Number: [phone] Requesting OT/PT/Skilled Nursing/Social Work/Speech Therapy: PT  Frequency: 1 w 4

## 2021-07-31 NOTE — Telephone Encounter (Signed)
Verbal given 

## 2021-08-03 DIAGNOSIS — R5381 Other malaise: Secondary | ICD-10-CM | POA: Diagnosis not present

## 2021-08-03 DIAGNOSIS — M069 Rheumatoid arthritis, unspecified: Secondary | ICD-10-CM | POA: Diagnosis not present

## 2021-08-03 DIAGNOSIS — F322 Major depressive disorder, single episode, severe without psychotic features: Secondary | ICD-10-CM | POA: Diagnosis not present

## 2021-08-04 DIAGNOSIS — Z9181 History of falling: Secondary | ICD-10-CM | POA: Diagnosis not present

## 2021-08-04 DIAGNOSIS — Z79899 Other long term (current) drug therapy: Secondary | ICD-10-CM | POA: Diagnosis not present

## 2021-08-04 DIAGNOSIS — M069 Rheumatoid arthritis, unspecified: Secondary | ICD-10-CM | POA: Diagnosis not present

## 2021-08-04 DIAGNOSIS — F32A Depression, unspecified: Secondary | ICD-10-CM | POA: Diagnosis not present

## 2021-08-04 DIAGNOSIS — Z7952 Long term (current) use of systemic steroids: Secondary | ICD-10-CM | POA: Diagnosis not present

## 2021-08-04 DIAGNOSIS — E785 Hyperlipidemia, unspecified: Secondary | ICD-10-CM | POA: Diagnosis not present

## 2021-08-04 DIAGNOSIS — Z87891 Personal history of nicotine dependence: Secondary | ICD-10-CM | POA: Diagnosis not present

## 2021-08-04 DIAGNOSIS — I10 Essential (primary) hypertension: Secondary | ICD-10-CM | POA: Diagnosis not present

## 2021-08-04 DIAGNOSIS — E871 Hypo-osmolality and hyponatremia: Secondary | ICD-10-CM | POA: Diagnosis not present

## 2021-08-04 DIAGNOSIS — R5381 Other malaise: Secondary | ICD-10-CM | POA: Diagnosis not present

## 2021-08-06 ENCOUNTER — Encounter (HOSPITAL_COMMUNITY): Payer: Self-pay | Admitting: Emergency Medicine

## 2021-08-06 ENCOUNTER — Emergency Department (HOSPITAL_COMMUNITY): Payer: Federal, State, Local not specified - PPO

## 2021-08-06 ENCOUNTER — Emergency Department (HOSPITAL_COMMUNITY)
Admission: EM | Admit: 2021-08-06 | Discharge: 2021-08-06 | Disposition: A | Discharge status: Home / Self Care | Payer: Federal, State, Local not specified - PPO | Attending: Emergency Medicine | Admitting: Emergency Medicine

## 2021-08-06 ENCOUNTER — Other Ambulatory Visit: Payer: Self-pay

## 2021-08-06 DIAGNOSIS — R112 Nausea with vomiting, unspecified: Secondary | ICD-10-CM | POA: Diagnosis not present

## 2021-08-06 DIAGNOSIS — Z20822 Contact with and (suspected) exposure to covid-19: Secondary | ICD-10-CM | POA: Diagnosis not present

## 2021-08-06 DIAGNOSIS — R5381 Other malaise: Secondary | ICD-10-CM | POA: Diagnosis not present

## 2021-08-06 DIAGNOSIS — M069 Rheumatoid arthritis, unspecified: Secondary | ICD-10-CM | POA: Diagnosis not present

## 2021-08-06 DIAGNOSIS — R079 Chest pain, unspecified: Secondary | ICD-10-CM | POA: Diagnosis not present

## 2021-08-06 DIAGNOSIS — I10 Essential (primary) hypertension: Secondary | ICD-10-CM | POA: Diagnosis not present

## 2021-08-06 DIAGNOSIS — R42 Dizziness and giddiness: Secondary | ICD-10-CM | POA: Diagnosis not present

## 2021-08-06 DIAGNOSIS — Z87891 Personal history of nicotine dependence: Secondary | ICD-10-CM | POA: Diagnosis not present

## 2021-08-06 DIAGNOSIS — E860 Dehydration: Secondary | ICD-10-CM | POA: Diagnosis not present

## 2021-08-06 DIAGNOSIS — R000 Tachycardia, unspecified: Secondary | ICD-10-CM | POA: Diagnosis not present

## 2021-08-06 DIAGNOSIS — Z79899 Other long term (current) drug therapy: Secondary | ICD-10-CM | POA: Insufficient documentation

## 2021-08-06 DIAGNOSIS — Z9181 History of falling: Secondary | ICD-10-CM | POA: Diagnosis not present

## 2021-08-06 DIAGNOSIS — Z7952 Long term (current) use of systemic steroids: Secondary | ICD-10-CM | POA: Diagnosis not present

## 2021-08-06 DIAGNOSIS — E785 Hyperlipidemia, unspecified: Secondary | ICD-10-CM | POA: Diagnosis not present

## 2021-08-06 DIAGNOSIS — E871 Hypo-osmolality and hyponatremia: Secondary | ICD-10-CM | POA: Diagnosis not present

## 2021-08-06 DIAGNOSIS — F32A Depression, unspecified: Secondary | ICD-10-CM | POA: Diagnosis not present

## 2021-08-06 DIAGNOSIS — R059 Cough, unspecified: Secondary | ICD-10-CM

## 2021-08-06 LAB — URINALYSIS, ROUTINE W REFLEX MICROSCOPIC
Bacteria, UA: NONE SEEN
Bilirubin Urine: NEGATIVE
Glucose, UA: NEGATIVE mg/dL
Hgb urine dipstick: NEGATIVE
Ketones, ur: 5 mg/dL — AB
Leukocytes,Ua: NEGATIVE
Nitrite: NEGATIVE
Protein, ur: 100 mg/dL — AB
Specific Gravity, Urine: 1.031 — ABNORMAL HIGH (ref 1.005–1.030)
pH: 5 (ref 5.0–8.0)

## 2021-08-06 LAB — CBC WITH DIFFERENTIAL/PLATELET
Abs Immature Granulocytes: 0.06 10*3/uL (ref 0.00–0.07)
Basophils Absolute: 0 10*3/uL (ref 0.0–0.1)
Basophils Relative: 0 %
Eosinophils Absolute: 0 10*3/uL (ref 0.0–0.5)
Eosinophils Relative: 0 %
HCT: 39.7 % (ref 36.0–46.0)
Hemoglobin: 13.5 g/dL (ref 12.0–15.0)
Immature Granulocytes: 1 %
Lymphocytes Relative: 5 %
Lymphs Abs: 0.3 10*3/uL — ABNORMAL LOW (ref 0.7–4.0)
MCH: 31.3 pg (ref 26.0–34.0)
MCHC: 34 g/dL (ref 30.0–36.0)
MCV: 92.1 fL (ref 80.0–100.0)
Monocytes Absolute: 0.1 10*3/uL (ref 0.1–1.0)
Monocytes Relative: 2 %
Neutro Abs: 6 10*3/uL (ref 1.7–7.7)
Neutrophils Relative %: 92 %
Platelets: 182 10*3/uL (ref 150–400)
RBC: 4.31 MIL/uL (ref 3.87–5.11)
RDW: 15.5 % (ref 11.5–15.5)
WBC: 6.5 10*3/uL (ref 4.0–10.5)
nRBC: 0 % (ref 0.0–0.2)

## 2021-08-06 LAB — COMPREHENSIVE METABOLIC PANEL
ALT: 19 U/L (ref 0–44)
AST: 25 U/L (ref 15–41)
Albumin: 3.7 g/dL (ref 3.5–5.0)
Alkaline Phosphatase: 63 U/L (ref 38–126)
Anion gap: 10 (ref 5–15)
BUN: 22 mg/dL (ref 8–23)
CO2: 19 mmol/L — ABNORMAL LOW (ref 22–32)
Calcium: 10.3 mg/dL (ref 8.9–10.3)
Chloride: 102 mmol/L (ref 98–111)
Creatinine, Ser: 1.38 mg/dL — ABNORMAL HIGH (ref 0.44–1.00)
GFR, Estimated: 43 mL/min — ABNORMAL LOW (ref >60)
Glucose, Bld: 201 mg/dL — ABNORMAL HIGH (ref 70–99)
Potassium: 4.2 mmol/L (ref 3.5–5.1)
Sodium: 131 mmol/L — ABNORMAL LOW (ref 135–145)
Total Bilirubin: 1 mg/dL (ref 0.3–1.2)
Total Protein: 6.7 g/dL (ref 6.5–8.1)

## 2021-08-06 LAB — RESP PANEL BY RT-PCR (FLU A&B, COVID) ARPGX2
Influenza A by PCR: NEGATIVE
Influenza B by PCR: NEGATIVE
SARS Coronavirus 2 by RT PCR: NEGATIVE

## 2021-08-06 LAB — LACTIC ACID, PLASMA: Lactic Acid, Venous: 2 mmol/L (ref 0.5–1.9)

## 2021-08-06 LAB — CK: Total CK: 34 U/L — ABNORMAL LOW (ref 38–234)

## 2021-08-06 LAB — TROPONIN I (HIGH SENSITIVITY)
Troponin I (High Sensitivity): 6 ng/L (ref <18)
Troponin I (High Sensitivity): 4 ng/L (ref <18)

## 2021-08-06 LAB — LIPASE, BLOOD: Lipase: 40 U/L (ref 11–51)

## 2021-08-06 IMAGING — CR DG CHEST 1V
1 series · 1 of 1 positions shown · non-contrast
Comparison: [DATE]

CLINICAL DATA: Dizziness, nausea and vomiting, bilateral leg pain

EXAM:
CHEST  1 VIEW

[x chest ap]
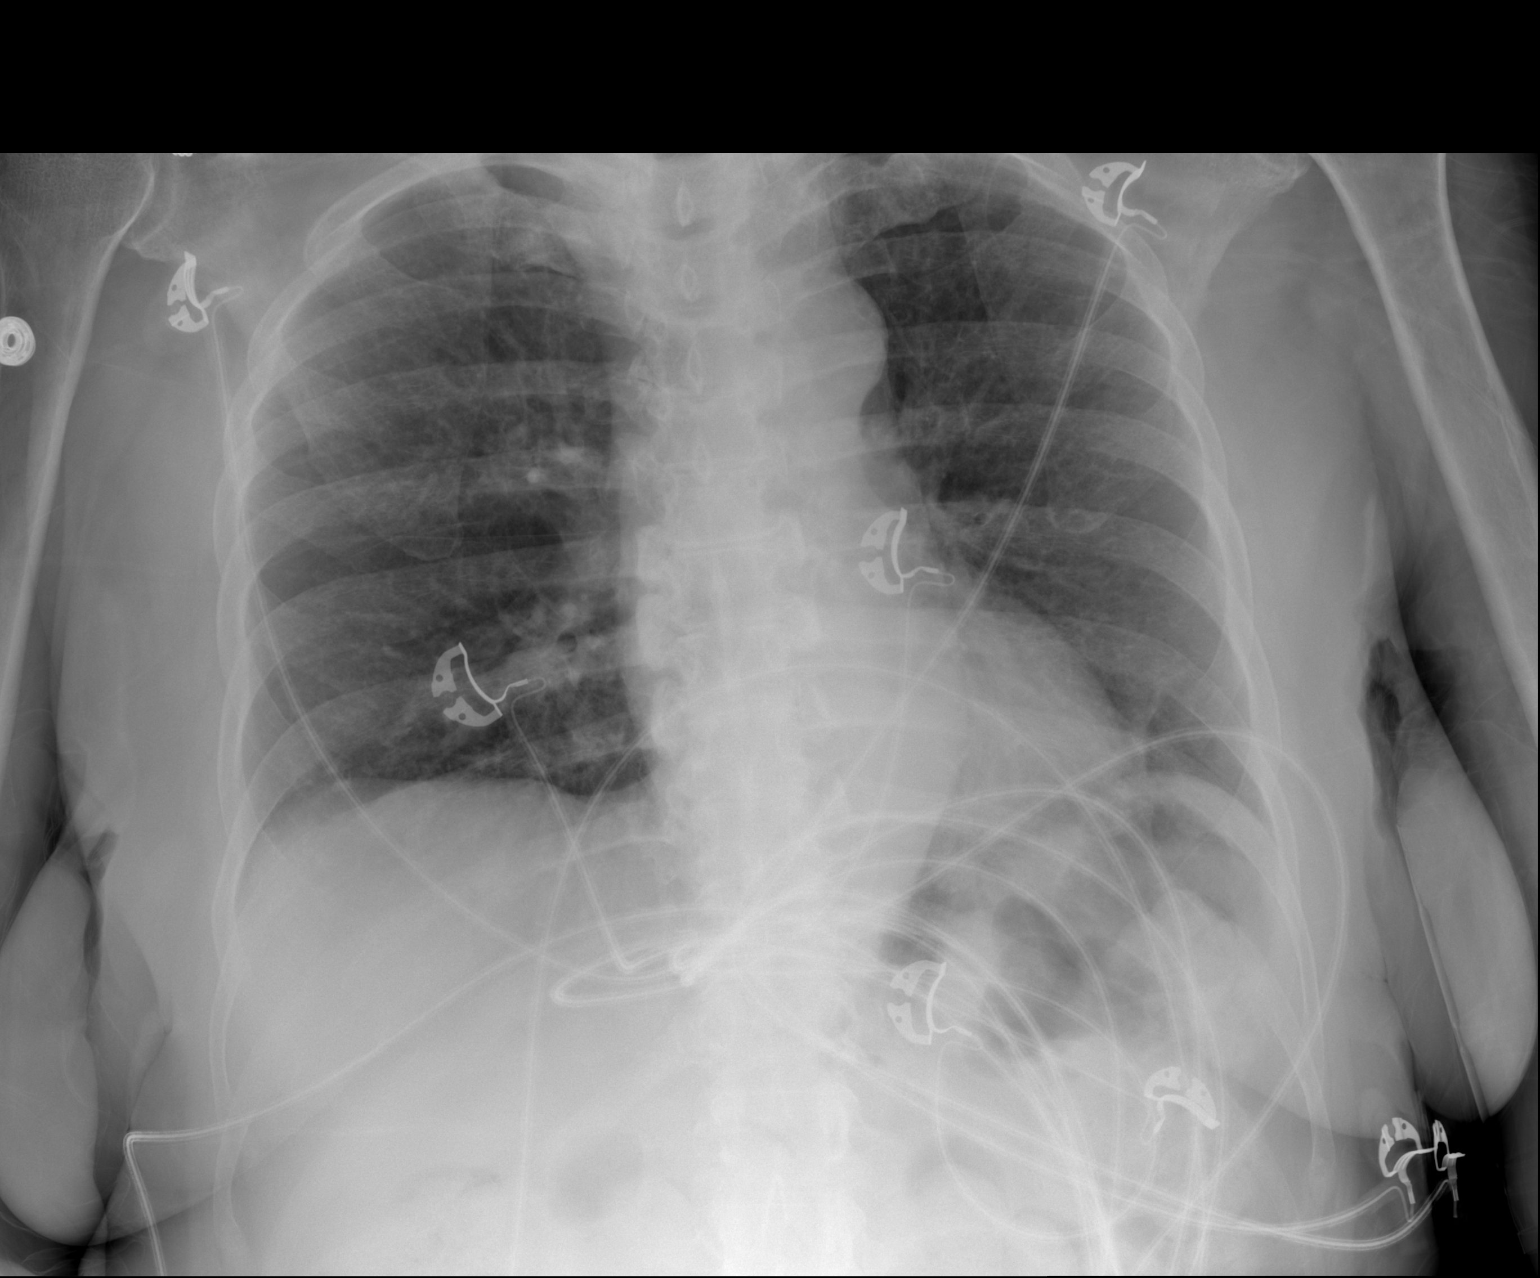

[1 of 1 positions shown; findings below may reference images not displayed]

FINDINGS: Single frontal view of the chest demonstrates an unremarkable
cardiac silhouette. No acute airspace disease, effusion, or
pneumothorax. No acute bony abnormalities.
IMPRESSION: 1. No acute intrathoracic process.

## 2021-08-06 IMAGING — CT CT HEAD W/O CM
3 series · 14 of 47 positions shown, 16 images · non-contrast
Comparison: [DATE], [DATE]

CLINICAL DATA: Dizziness, nausea and vomiting



[Series 2: head wo · axial · 0.47mm/px · z∈[-116,+9]mm · 8 of 31 slices shown, 10 images]
[im 3/31  brain]
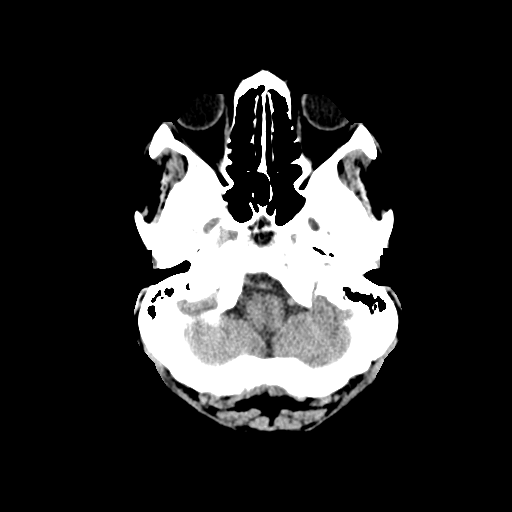
[im 3/31  bone]
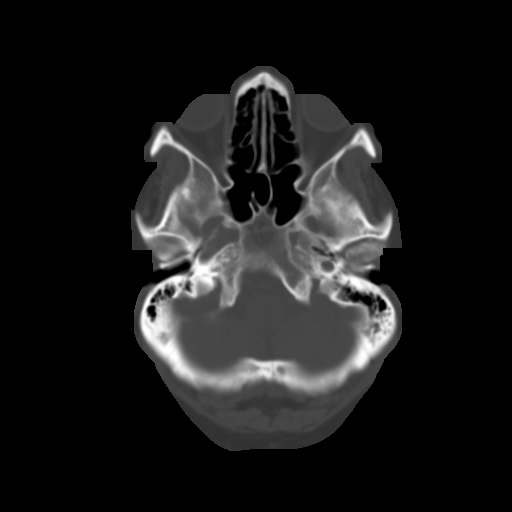
[im 7/31  brain]
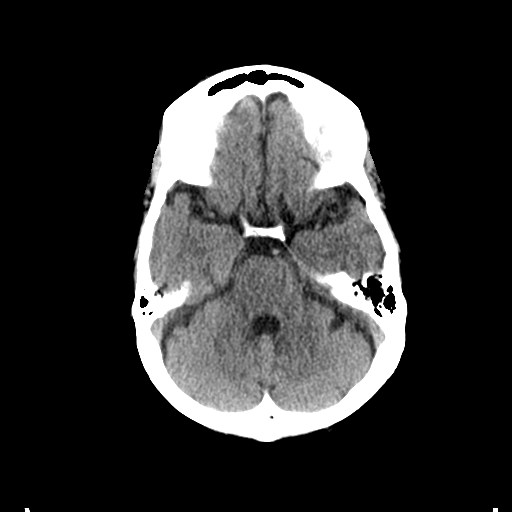
[im 10/31  brain]
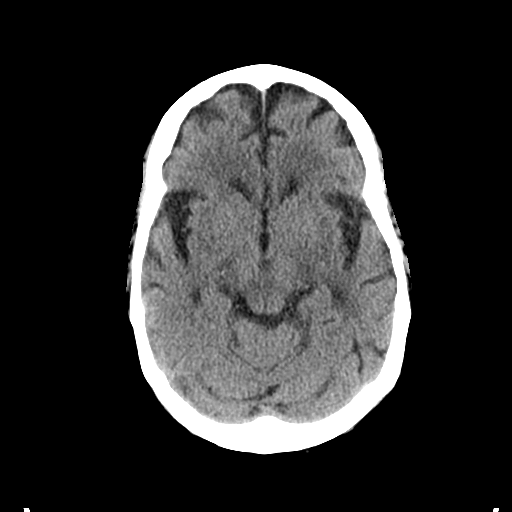
[im 14/31  brain]
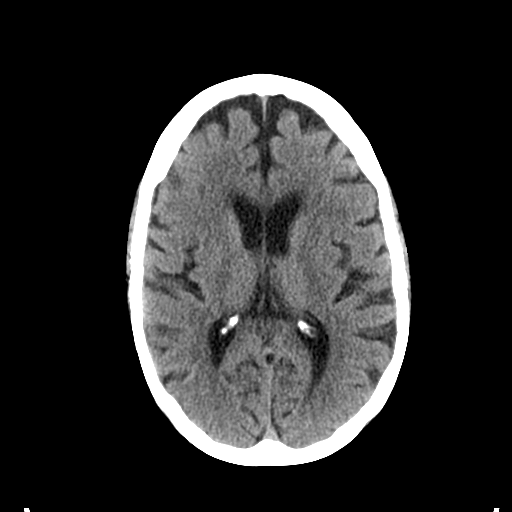
[im 17/31  brain]
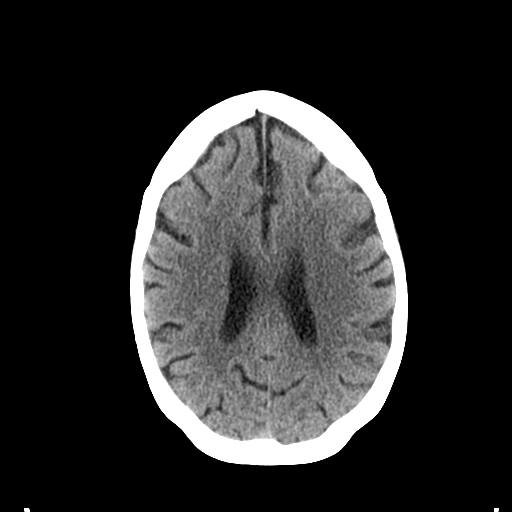
[im 17/31  bone]
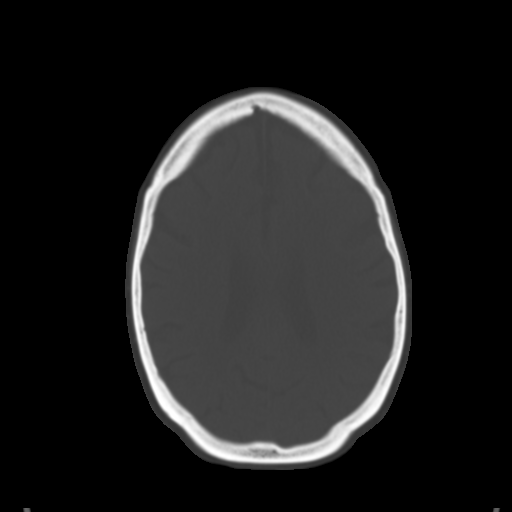
[im 21/31  brain]
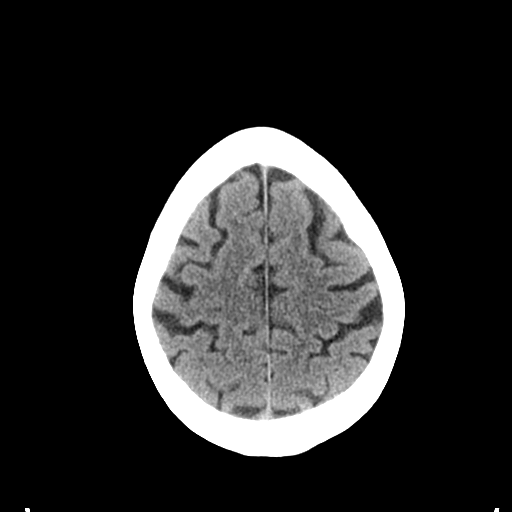
[im 24/31  brain]
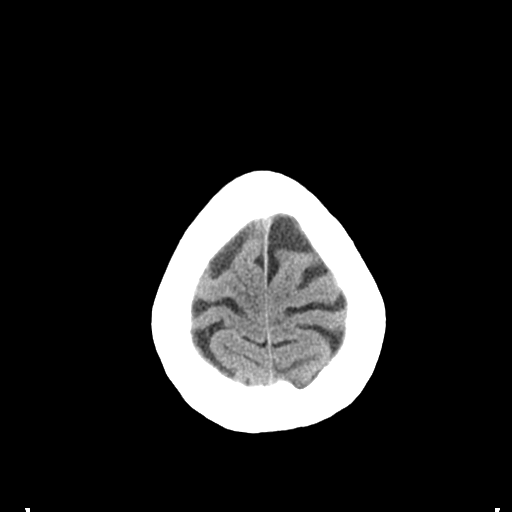
[im 28/31  brain]
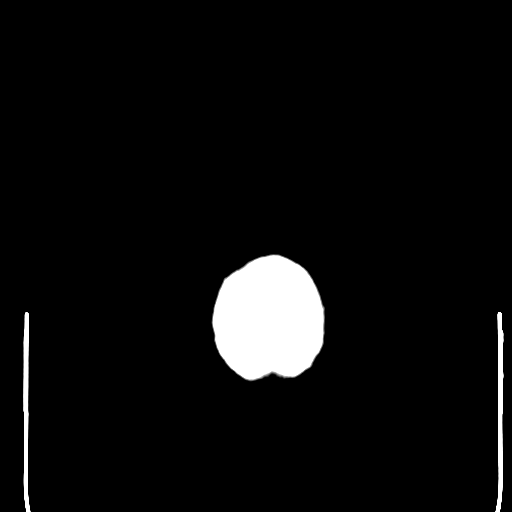

[Series 4: coronal soft tissue · coronal · 0.30mm/px · 3 of 84 slices shown]
[im 30/84  brain]
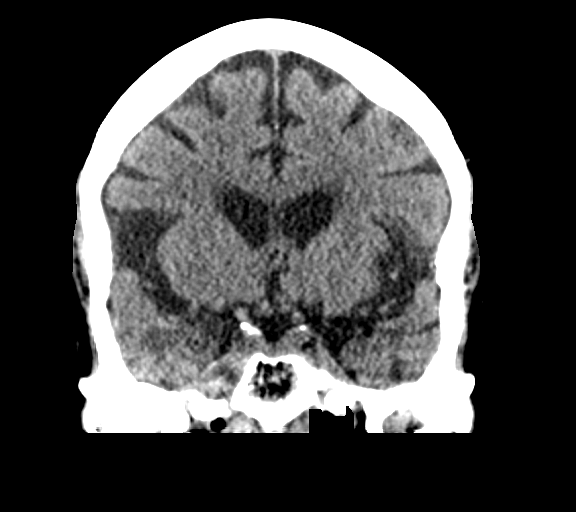
[im 38/84  brain]
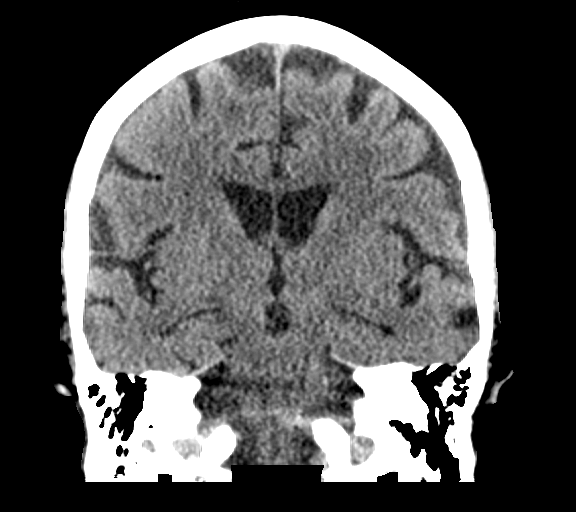
[im 46/84  brain]
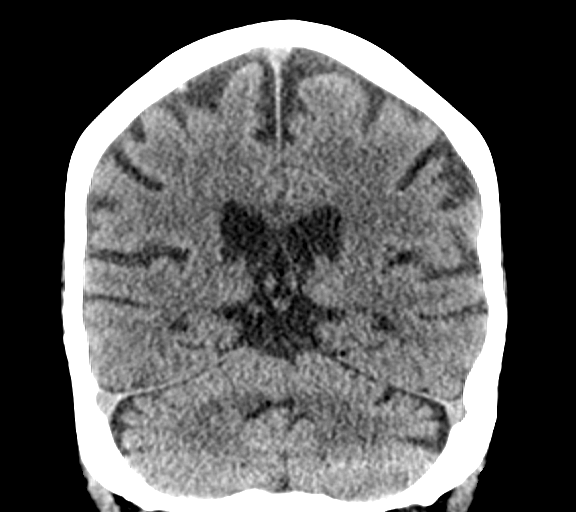

[Series 5: sagittal soft tissue · sagittal · 0.30mm/px · 3 of 58 slices shown]
[im 20/58  brain]
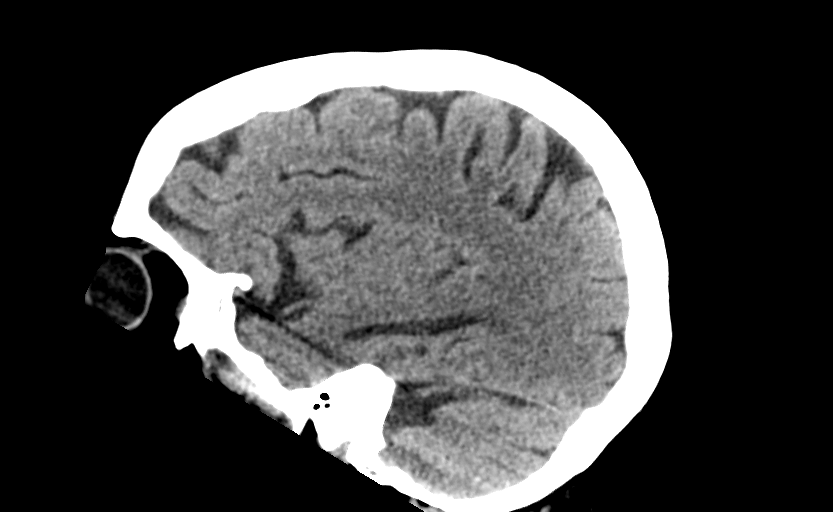
[im 29/58  brain]
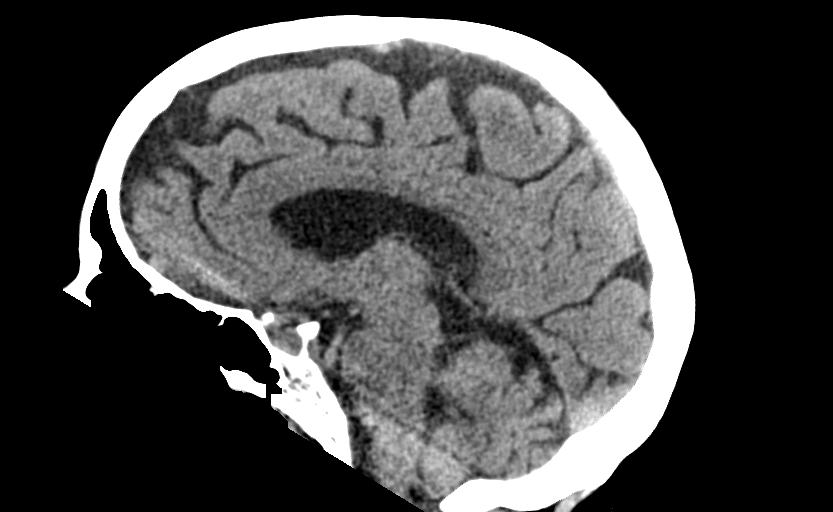
[im 39/58  brain]
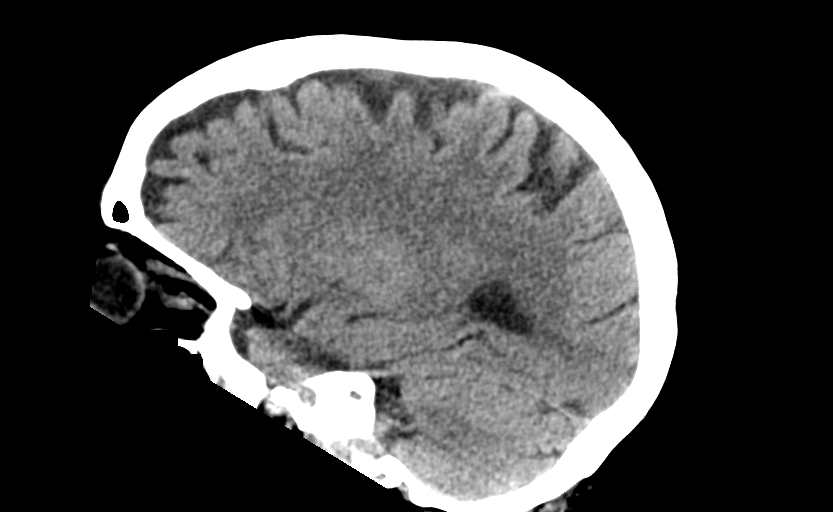

[14 of 47 positions shown; findings below may reference images not displayed]

FINDINGS: Brain: No acute infarct or hemorrhage. Lateral ventricles and
midline structures are stable. No acute extra-axial fluid
collections. No mass effect.

Vascular: No hyperdense vessel or unexpected calcification.

Skull: Normal. Negative for fracture or focal lesion.

Sinuses/Orbits: No acute finding.

Other: None.
IMPRESSION: 1. No acute intracranial process.

## 2021-08-06 MED ORDER — LACTATED RINGERS IV BOLUS
1000.0000 mL | Freq: Once | INTRAVENOUS | Status: AC
  Administered 2021-08-06: 1000 mL via INTRAVENOUS

## 2021-08-06 NOTE — ED Notes (Signed)
Patients gown and sheets were changed after patient vomited.  ?

## 2021-08-06 NOTE — Discharge Instructions (Signed)
Today there is no sign of infection.  Your chest x-ray was normal, urine without signs of infection and your COVID test is negative.  Your labs look good except that you do appear dehydrated today.  After fluids your blood pressure and heart rate are improved.  At this time I would continue taking the prednisone at 30 mg but your doctor may want to adjust it since you seem to feel worse when you decrease the dose this last time.  If you start having fever, confusion, still or not eating and the vomiting returns return to the emergency room. ?

## 2021-08-06 NOTE — ED Triage Notes (Signed)
BIB sister, reports dizziness, N/V since Monday. Reports bilateral leg pain since a fall 2 months ago. Decreased appetite, able to keep down water. Constipation, denies fevers.  ?

## 2021-08-06 NOTE — ED Provider Notes (Signed)
[facility]-EMERGENCY DEPT Provider Note   CSN: [id number] Arrival date & time: 08/06/21  1511     History  Chief Complaint  Patient presents with   Dizziness   Emesis   Fall    Carrie [name] is a 61 y.o. female.  Pt is a 60y/o female with hx of hypertension hyperlipidemia depression, RA, and currently being worked up for dermatomyositis on tapering dose of prednisone and currently increasing cellcept who is coming from home and presenting today with multiple complaints.  She reports for the last 5 days she has had dizziness which she describes as feeling lightheaded with standing, nausea and vomiting.  She reports this has been intermittent for the last 5 days.  She has been drinking some but has not been eating at all.  She has had a minimal cough that is been nonproductive and denies any fevers.  No chest pain but she does feel shortness of breath with exertion.  She also complains of feeling weak all over but denies any unilateral symptoms.  Patient's family member is present in the room with her and reports that she has been gradually decreasing her prednisone and is now on 30 mg/day which was decreased 5 days ago.  They have been tapering the other medications but she has not started any new medications.  She reports she has not had any falls in the last 5 days and is still working with physical therapy.  The history is provided by the patient and medical records.  Dizziness Associated symptoms: vomiting   Emesis Fall      Home Medications Prior to Admission medications   Medication Sig Start Date End Date Taking? Authorizing Provider  acetaminophen (TYLENOL) 325 MG tablet Take 2 tablets (650 mg total) by mouth every 6 (six) hours as needed for mild pain (or Fever >/= 101). 04/29/21   Angiulli, Mcarthur Rossetti, PA-C  ARIPiprazole (ABILIFY) 5 MG tablet Take 5 mg by mouth daily.    [provider]  buPROPion (WELLBUTRIN XL) 300 MG 24 hr tablet Take 1  tablet (300 mg total) by mouth daily. 05/26/21   Seabron Spates R, DO  escitalopram (LEXAPRO) 20 MG tablet Take 1 tablet (20 mg total) by mouth daily. 05/26/21   Seabron Spates R, DO  fluticasone (FLONASE) 50 MCG/ACT nasal spray Place 2 sprays into both nostrils daily. Patient taking differently: Place 2 sprays into both nostrils daily as needed for allergies. 12/24/20   Seabron Spates R, DO  lisinopril (ZESTRIL) 20 MG tablet Take 1 tablet (20 mg total) by mouth daily. 05/26/21   Donato Schultz, DO  loratadine (CLARITIN) 10 MG tablet Take 1 tablet (10 mg total) by mouth daily. 12/24/20   Donato Schultz, DO  pantoprazole (PROTONIX) 40 MG tablet Take 1 tablet (40 mg total) by mouth daily. 05/26/21   Donato Schultz, DO  predniSONE (DELTASONE) 10 MG tablet Take 40 mg by mouth daily. 06/20/21   [provider]  senna-docusate (SENOKOT-S) 8.6-50 MG tablet Take 1 tablet by mouth 2 (two) times daily. 04/29/21   Angiulli, Mcarthur Rossetti, PA-C  traMADol (ULTRAM) 50 MG tablet Take 1 tablet (50 mg total) by mouth every 12 (twelve) hours as needed for moderate pain. 05/26/21   Donato Schultz, DO  Vitamin D3 (VITAMIN D) 25 MCG tablet Take 1 tablet (1,000 Units total) by mouth daily. 05/26/21   Donato Schultz, DO  Allergies    Zoloft [sertraline hcl]    Review of Systems   Review of Systems  Gastrointestinal:  Positive for vomiting.  Neurological:  Positive for dizziness.   Physical Exam Updated Vital Signs BP 107/80   Pulse 89   Temp 97.9 F (36.6 C) (Oral)   Resp (!) 22   Ht 5\' 5"  (1.651 m)   Wt 63.7 kg   SpO2 98%   BMI 23.37 kg/m  Physical Exam Vitals and nursing note reviewed.  Constitutional:      General: She is in acute distress.     Appearance: She is well-developed.  HENT:     Head: Normocephalic and atraumatic.     Comments: Moon face    Mouth/Throat:     Mouth: Mucous membranes are dry.  Eyes:     Pupils: Pupils are equal,  round, and reactive to light.  Cardiovascular:     Rate and Rhythm: Regular rhythm. Tachycardia present.     Heart sounds: Normal heart sounds. No murmur heard.   No friction rub.  Pulmonary:     Effort: Pulmonary effort is normal.     Breath sounds: Normal breath sounds. No wheezing or rales.  Abdominal:     General: Bowel sounds are normal. There is no distension.     Palpations: Abdomen is soft.     Tenderness: There is no abdominal tenderness. There is no guarding or rebound.  Musculoskeletal:        General: No tenderness. Normal range of motion.     Right lower leg: No edema.     Left lower leg: No edema.     Comments: No edema  Skin:    General: Skin is warm and dry.     Findings: No rash.     Comments: Erythematous rash noted over the bridge of the nose and under the eyes bilaterally  Neurological:     Mental Status: She is alert and oriented to person, place, and time.     Cranial Nerves: No cranial nerve deficit.     Comments: 4-5 strength in the bilateral lower and upper extremities.  Sensation intact  Psychiatric:        Mood and Affect: Mood normal.        Behavior: Behavior normal.    ED Results / Procedures / Treatments   Labs (all labs ordered are listed, but only abnormal results are displayed) Labs Reviewed  COMPREHENSIVE METABOLIC PANEL - Abnormal; Notable for the following components:      Result Value   Sodium 131 (*)    CO2 19 (*)    Glucose, Bld 201 (*)    Creatinine, Ser 1.38 (*)    GFR, Estimated 43 (*)    All other components within normal limits  CBC WITH DIFFERENTIAL/PLATELET - Abnormal; Notable for the following components:   Lymphs Abs 0.3 (*)    All other components within normal limits  URINALYSIS, ROUTINE W REFLEX MICROSCOPIC - Abnormal; Notable for the following components:   Color, Urine AMBER (*)    APPearance HAZY (*)    Specific Gravity, Urine 1.031 (*)    Ketones, ur 5 (*)    Protein, ur 100 (*)    All other components within  normal limits  CK - Abnormal; Notable for the following components:   Total CK 34 (*)    All other components within normal limits  LACTIC ACID, PLASMA - Abnormal; Notable for the following components:   Lactic Acid, Venous 2.0 (*)  All other components within normal limits  RESP PANEL BY RT-PCR (FLU A&B, COVID) ARPGX2  LIPASE, BLOOD  TROPONIN I (HIGH SENSITIVITY)  TROPONIN I (HIGH SENSITIVITY)    EKG EKG Interpretation  Date/Time:  Wednesday August 06 2021 20:50:41 EST Ventricular Rate:  105 PR Interval:    QRS Duration: 73 QT Interval:  399 QTC Calculation: 523 R Axis:   23 Text Interpretation: Sinus tachycardia Low voltage, extremity and precordial leads Nonspecific T abnormalities, lateral leads Prolonged QT interval No significant change since last tracing Confirmed by Gwyneth Sprout ([id number]) on 08/06/2021 8:56:01 PM  Radiology DG Chest 1 View  Result Date: 08/06/2021 CLINICAL DATA:  Dizziness, nausea and vomiting, bilateral leg pain EXAM: CHEST  1 VIEW COMPARISON:  04/16/2021 FINDINGS: Single frontal view of the chest demonstrates an unremarkable cardiac silhouette. No acute airspace disease, effusion, or pneumothorax. No acute bony abnormalities. IMPRESSION: 1. No acute intrathoracic process. Electronically Signed   By: Sharlet Salina M.D.   On: 08/06/2021 19:41   CT Head Wo Contrast  Result Date: 08/06/2021 CLINICAL DATA:  Dizziness, nausea and vomiting EXAM: CT HEAD WITHOUT CONTRAST TECHNIQUE: Contiguous axial images were obtained from the base of the skull through the vertex without intravenous contrast. RADIATION DOSE REDUCTION: This exam was performed according to the departmental dose-optimization program which includes automated exposure control, adjustment of the mA and/or kV according to patient size and/or use of iterative reconstruction technique. COMPARISON:  03/26/2021, 04/17/2021 FINDINGS: Brain: No acute infarct or hemorrhage. Lateral ventricles and midline  structures are stable. No acute extra-axial fluid collections. No mass effect. Vascular: No hyperdense vessel or unexpected calcification. Skull: Normal. Negative for fracture or focal lesion. Sinuses/Orbits: No acute finding. Other: None. IMPRESSION: 1. No acute intracranial process. Electronically Signed   By: Sharlet Salina M.D.   On: 08/06/2021 19:40    Procedures Procedures    Medications Ordered in ED Medications  lactated ringers bolus 1,000 mL (0 mLs Intravenous Stopped 08/06/21 2251)  lactated ringers bolus 1,000 mL (1,000 mLs Intravenous New Bag/Given 08/06/21 2157)    ED Course/ Medical Decision Making/ A&P                           Medical Decision Making Amount and/or Complexity of Data Reviewed External Data Reviewed: labs, radiology and notes. Labs: ordered. Decision-making details documented in ED Course. Radiology: ordered and independent interpretation performed. Decision-making details documented in ED Course. ECG/medicine tests: ordered and independent interpretation performed. Decision-making details documented in ED Course.   Patient is a 61 year old female with multiple medical problems and increased comorbidities who is presenting today with complaint of poor appetite, nausea vomiting and dizziness when she goes to stand.  Patient's dizziness seems to be more orthostatic in nature as it is only present when she is standing up.  If she is sitting down she can move her head side to side she does not feel off balance or vertiginous type symptoms.  She has had vomiting but has still been able to hold some things down but is not eating and appears dehydrated.  Her weakness is generalized and not focal and low suspicion for stroke at this time.  Unclear what is causing patient's symptoms as she has no abdominal pain and no focal findings on exam.  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  External medical records from her hospitalization this  fall were reviewed.  I independently viewed and interpreted patient's head CT  and chest x-ray and they both show no acute findings today.  Radiology read them is negative.  I independently interpreted patient's EKG and labs today and EKG shows a sinus tachycardia but no change otherwise, labs today with negative COVID, normal troponin, normal CBC, CMP with mild AKI with creatinine of 1.38 from baseline of 0.8, lactate mildly elevated 2.0 and CK is normal.  UA is still pending but patient has no findings to suggest infectious etiology today.  She has no abdominal pain that would suggest an acute abdominal process such as appendicitis, obstruction, diverticulitis.  Lipase and LFTs are within normal limits and low suspicion for gallbladder or liver pathology.  Suspect that patient's weakness is coming from dehydration most likely also the cause of her dizziness.  Family member who is present in the room reports she is done this in the past when she was hospitalized and improved with fluids.  Findings were discussed with the patient and her family member.  She was given IV fluids.  11:12 PM UA wnl.  Pt improved after fluids.  Tolerating po's. Here.  Pt does not have any social determinats affecting her d/c.  She has close f/u with pcp and specialist.  Family is at bedside and pt and her family are comfortable with plan.  Will d/c home with continue current meds.  Pt has antiemetics at home to use as needed.  Given return precautions. No indications for admission at this time.        Final Clinical Impression(s) / ED Diagnoses Final diagnoses:  Dehydration  Nausea and vomiting, unspecified vomiting type    Rx / DC Orders ED Discharge Orders     None         Gwyneth Sprout, MD 08/06/21 2313

## 2021-08-06 NOTE — ED Provider Triage Note (Signed)
Emergency Medicine Provider Triage Evaluation Note ? ?Carrie [name] , a 61 y.o. female  was evaluated in triage.  Pt complains of dizziness nausea and vomiting.  She reports that yesterday she vomited three times.  ?She denies any fevers.  She was constipated and took a laxative and had a bm yesterday.  She is no longer constipated.  ? ?She reports poor PO intake the day prior to the weakness and dizziness which started on Monday.  ? ? ?Physical Exam  ?BP (!) 111/91 (BP Location: Right Arm)   Pulse (!) 123   Temp 97.9 ?F (36.6 ?C) (Oral)   Resp 16   Ht 5\' 5"  (1.651 m)   Wt 63.7 kg   SpO2 100%   BMI 23.37 kg/m?  ?Gen:   Awake, no distress   ?Resp:  Normal effort  ?MSK:   Moves extremities without difficulty  ?Other:  Patient is slow to speak, she is able to answer questions.  ? ?Medical Decision Making  ?Medically screening exam initiated at 3:44 PM.  Appropriate orders placed.  Carrie [name] was informed that the remainder of the evaluation will be completed by another provider, this initial triage assessment does not replace that evaluation, and the importance of remaining in the ED until their evaluation is complete. ? ? ?  ?Cristina Gong, [place] ?08/06/21 1554 ? ?

## 2021-08-06 NOTE — ED Notes (Signed)
Patient assisted to the bedside commode.

## 2021-08-10 ENCOUNTER — Encounter (HOSPITAL_COMMUNITY): Payer: Self-pay

## 2021-08-10 ENCOUNTER — Emergency Department (HOSPITAL_COMMUNITY): Payer: Federal, State, Local not specified - PPO

## 2021-08-10 ENCOUNTER — Emergency Department (HOSPITAL_COMMUNITY)
Admission: EM | Admit: 2021-08-10 | Discharge: 2021-08-10 | Disposition: A | Discharge status: Home / Self Care | Payer: Federal, State, Local not specified - PPO | Attending: Emergency Medicine | Admitting: Emergency Medicine

## 2021-08-10 ENCOUNTER — Other Ambulatory Visit: Payer: Self-pay

## 2021-08-10 DIAGNOSIS — K5900 Constipation, unspecified: Secondary | ICD-10-CM | POA: Diagnosis not present

## 2021-08-10 DIAGNOSIS — R059 Cough, unspecified: Secondary | ICD-10-CM | POA: Diagnosis not present

## 2021-08-10 DIAGNOSIS — M79643 Pain in unspecified hand: Secondary | ICD-10-CM | POA: Diagnosis not present

## 2021-08-10 DIAGNOSIS — K760 Fatty (change of) liver, not elsewhere classified: Secondary | ICD-10-CM | POA: Diagnosis not present

## 2021-08-10 DIAGNOSIS — R112 Nausea with vomiting, unspecified: Secondary | ICD-10-CM | POA: Insufficient documentation

## 2021-08-10 DIAGNOSIS — S61401A Unspecified open wound of right hand, initial encounter: Secondary | ICD-10-CM | POA: Diagnosis not present

## 2021-08-10 DIAGNOSIS — Z79899 Other long term (current) drug therapy: Secondary | ICD-10-CM | POA: Insufficient documentation

## 2021-08-10 DIAGNOSIS — R1084 Generalized abdominal pain: Secondary | ICD-10-CM | POA: Diagnosis not present

## 2021-08-10 DIAGNOSIS — X58XXXA Exposure to other specified factors, initial encounter: Secondary | ICD-10-CM | POA: Insufficient documentation

## 2021-08-10 DIAGNOSIS — R109 Unspecified abdominal pain: Secondary | ICD-10-CM | POA: Diagnosis not present

## 2021-08-10 DIAGNOSIS — K8020 Calculus of gallbladder without cholecystitis without obstruction: Secondary | ICD-10-CM | POA: Diagnosis not present

## 2021-08-10 DIAGNOSIS — S6991XA Unspecified injury of right wrist, hand and finger(s), initial encounter: Secondary | ICD-10-CM | POA: Diagnosis not present

## 2021-08-10 DIAGNOSIS — K429 Umbilical hernia without obstruction or gangrene: Secondary | ICD-10-CM | POA: Diagnosis not present

## 2021-08-10 DIAGNOSIS — Z20822 Contact with and (suspected) exposure to covid-19: Secondary | ICD-10-CM | POA: Diagnosis not present

## 2021-08-10 DIAGNOSIS — R52 Pain, unspecified: Secondary | ICD-10-CM

## 2021-08-10 DIAGNOSIS — I10 Essential (primary) hypertension: Secondary | ICD-10-CM | POA: Diagnosis not present

## 2021-08-10 DIAGNOSIS — M6281 Muscle weakness (generalized): Secondary | ICD-10-CM | POA: Diagnosis not present

## 2021-08-10 DIAGNOSIS — R1031 Right lower quadrant pain: Secondary | ICD-10-CM | POA: Diagnosis not present

## 2021-08-10 LAB — URINALYSIS, ROUTINE W REFLEX MICROSCOPIC
Bilirubin Urine: NEGATIVE
Glucose, UA: NEGATIVE mg/dL
Hgb urine dipstick: NEGATIVE
Ketones, ur: 5 mg/dL — AB
Leukocytes,Ua: NEGATIVE
Nitrite: NEGATIVE
Protein, ur: 30 mg/dL — AB
Specific Gravity, Urine: 1.032 — ABNORMAL HIGH (ref 1.005–1.030)
pH: 5 (ref 5.0–8.0)

## 2021-08-10 LAB — RESP PANEL BY RT-PCR (FLU A&B, COVID) ARPGX2
Influenza A by PCR: NEGATIVE
Influenza B by PCR: NEGATIVE
SARS Coronavirus 2 by RT PCR: NEGATIVE

## 2021-08-10 LAB — COMPREHENSIVE METABOLIC PANEL
ALT: 15 U/L (ref 0–44)
AST: 24 U/L (ref 15–41)
Albumin: 3.2 g/dL — ABNORMAL LOW (ref 3.5–5.0)
Alkaline Phosphatase: 54 U/L (ref 38–126)
Anion gap: 10 (ref 5–15)
BUN: 17 mg/dL (ref 8–23)
CO2: 20 mmol/L — ABNORMAL LOW (ref 22–32)
Calcium: 9.3 mg/dL (ref 8.9–10.3)
Chloride: 102 mmol/L (ref 98–111)
Creatinine, Ser: 1.08 mg/dL — ABNORMAL HIGH (ref 0.44–1.00)
GFR, Estimated: 58 mL/min — ABNORMAL LOW (ref >60)
Glucose, Bld: 81 mg/dL (ref 70–99)
Potassium: 3.8 mmol/L (ref 3.5–5.1)
Sodium: 132 mmol/L — ABNORMAL LOW (ref 135–145)
Total Bilirubin: 0.9 mg/dL (ref 0.3–1.2)
Total Protein: 5.9 g/dL — ABNORMAL LOW (ref 6.5–8.1)

## 2021-08-10 LAB — CBC
HCT: 33.7 % — ABNORMAL LOW (ref 36.0–46.0)
Hemoglobin: 11.4 g/dL — ABNORMAL LOW (ref 12.0–15.0)
MCH: 30.8 pg (ref 26.0–34.0)
MCHC: 33.8 g/dL (ref 30.0–36.0)
MCV: 91.1 fL (ref 80.0–100.0)
Platelets: 144 10*3/uL — ABNORMAL LOW (ref 150–400)
RBC: 3.7 MIL/uL — ABNORMAL LOW (ref 3.87–5.11)
RDW: 15.2 % (ref 11.5–15.5)
WBC: 4.4 10*3/uL (ref 4.0–10.5)
nRBC: 0 % (ref 0.0–0.2)

## 2021-08-10 LAB — LIPASE, BLOOD: Lipase: 31 U/L (ref 11–51)

## 2021-08-10 IMAGING — US US ABDOMEN LIMITED
1 series · 15 of 25 positions shown · non-contrast
Comparison: [DATE]

CLINICAL DATA: Abdominal pain.  Rule out gallstone.

EXAM:
ULTRASOUND ABDOMEN LIMITED RIGHT UPPER QUADRANT

[Series 1: us abdomen limited ruq mc & wl · 15 of 65 slices shown]
[im 1/65]
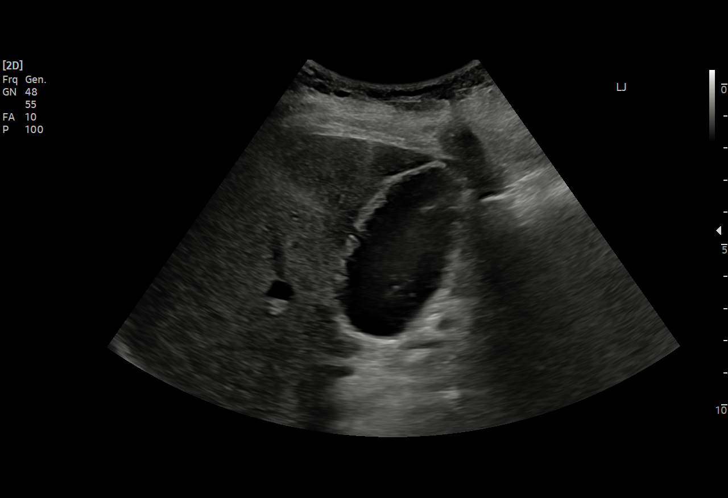
[im 6/65]
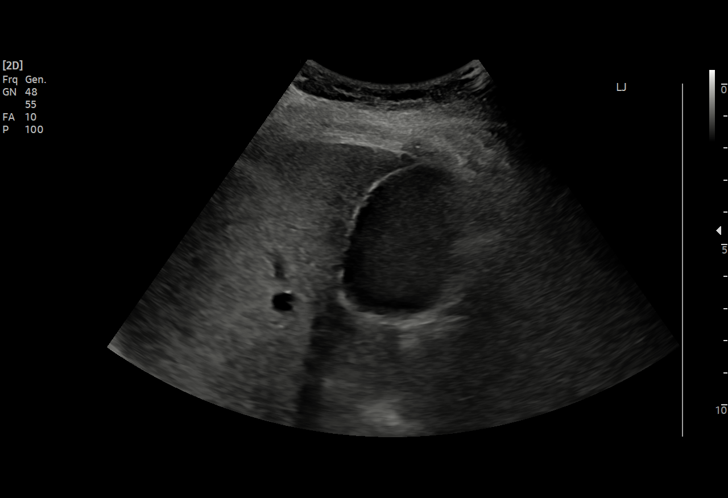
[im 11/65]
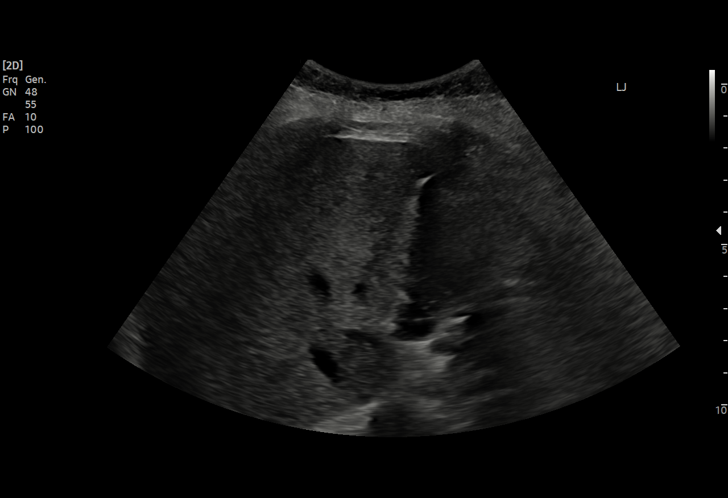
[im 14/65]
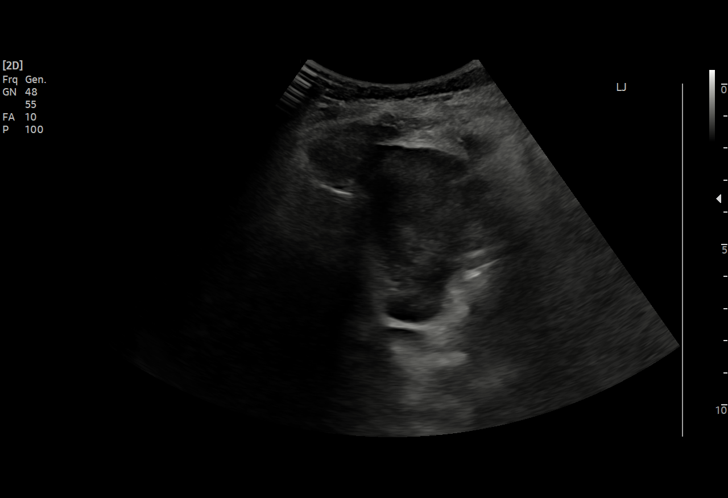
[im 19/65]
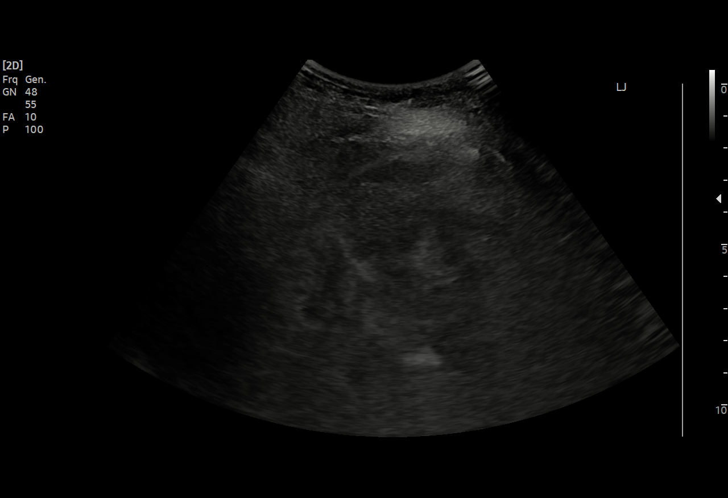
[im 25/65]
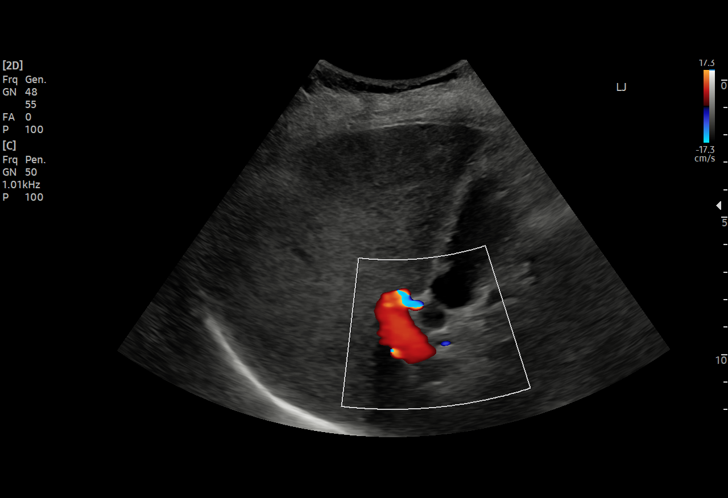
[im 27/65]
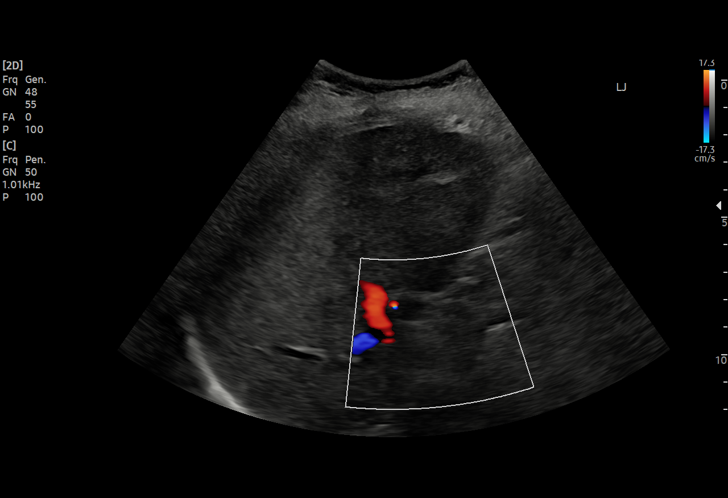
[im 33/65]
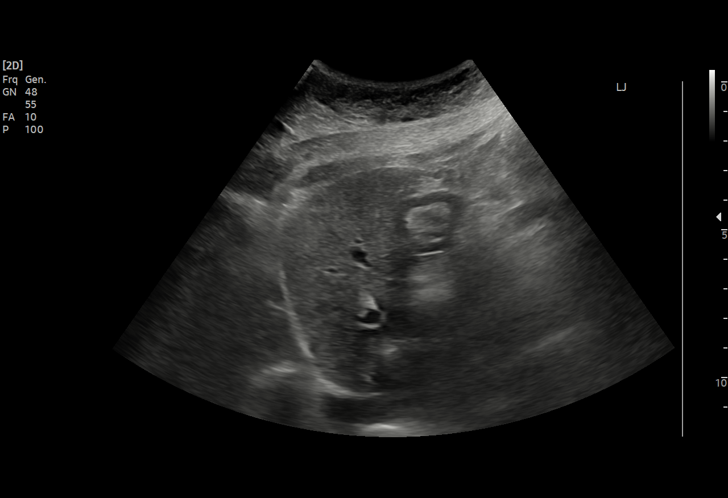
[im 38/65]
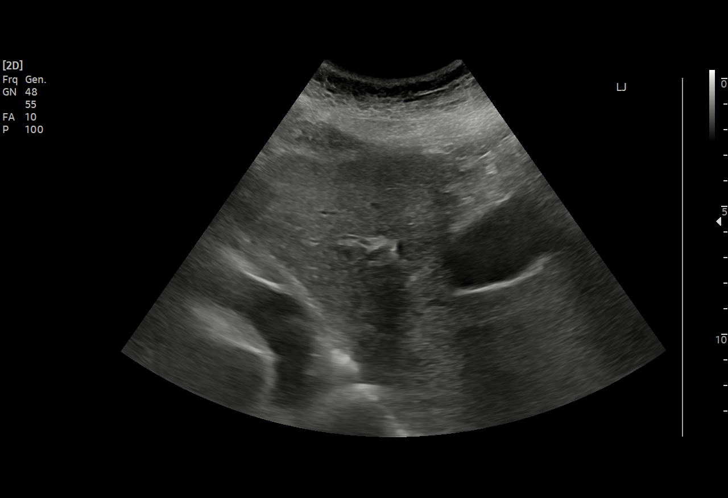
[im 41/65]
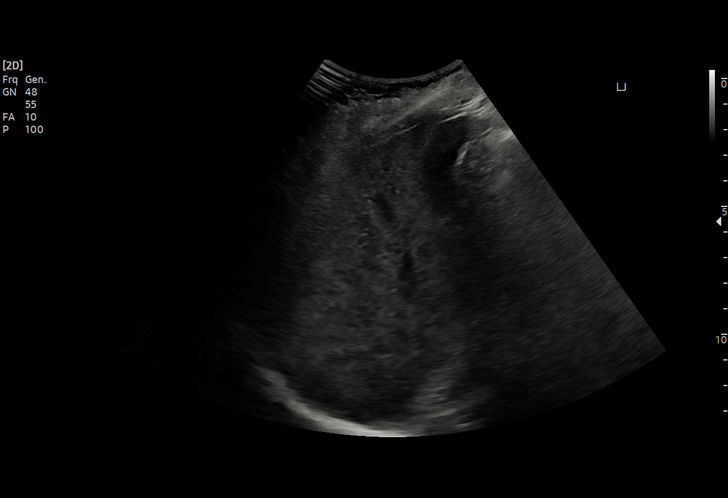
[im 46/65]
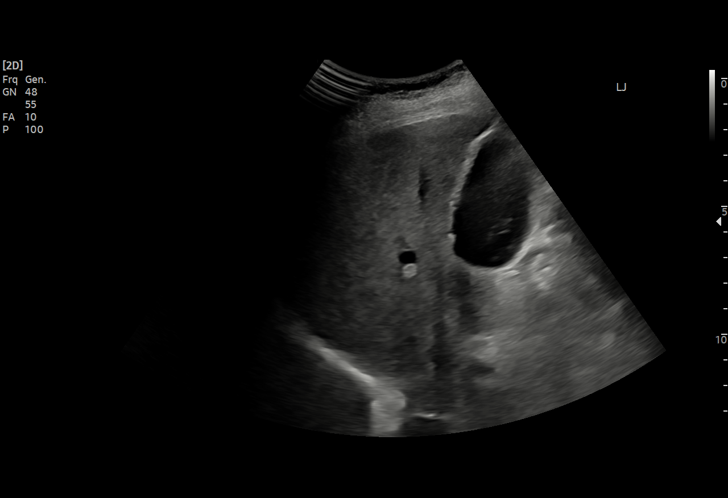
[im 51/65]
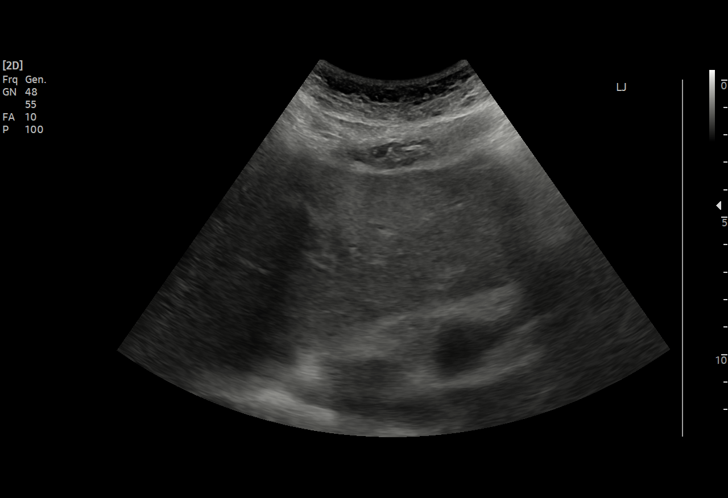
[im 54/65]
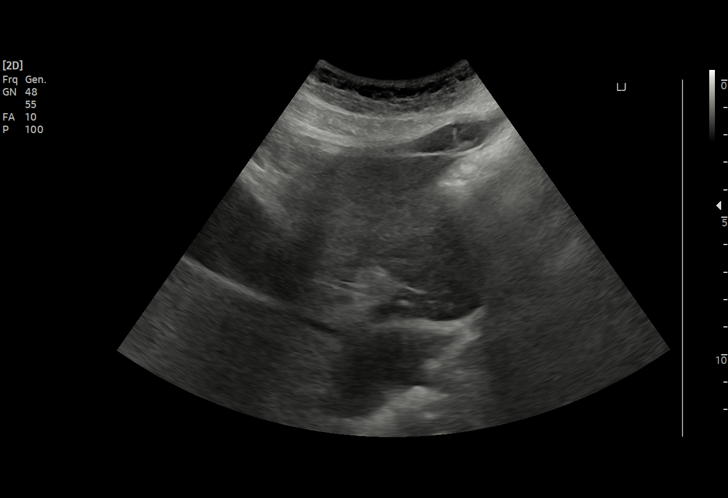
[im 59/65]
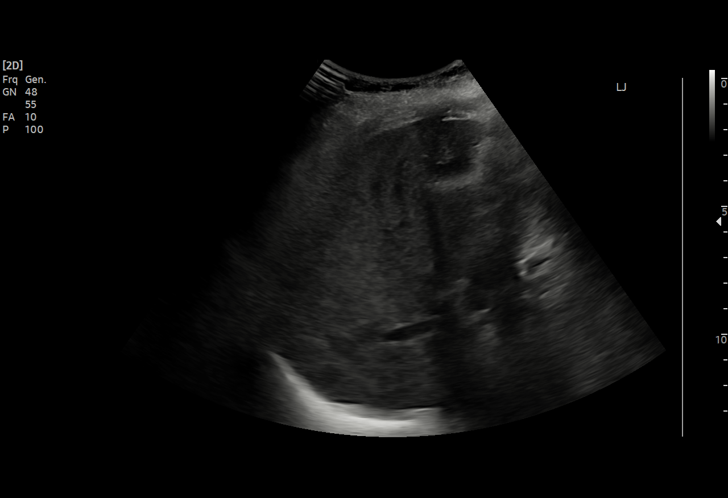
[im 65/65]
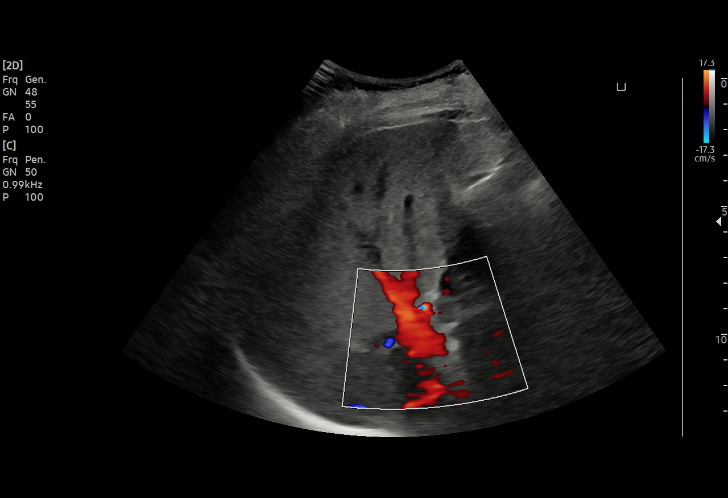

[15 of 25 positions shown; findings below may reference images not displayed]

FINDINGS: Gallbladder:

The gallbladder wall is thickened measuring 4 mm. Sludge is noted
layering within the gallbladder. No gallstones. Negative sonographic
Murphy's sign.

Common bile duct:

Diameter: 7 mm.

Liver:

Increased parenchymal echogenicity identified. No focal liver lesion
identified. Portal vein is patent on color Doppler imaging with
normal direction of blood flow towards the liver.

Other: Exam detail is diminished due to bowel gas and patient's
inability to breath hold.
IMPRESSION: 1. Gallbladder sludge and gallbladder wall thickening. No gallstones
or sonographic Murphy's sign.
2. Common bile duct is mildly dilated measuring 7 mm.

## 2021-08-10 IMAGING — CT CT ABD-PELV W/ CM
2 of 5 series · 16 of 46 positions shown, 18 images · IV contrast (agent unspecified)
Comparison: None.

CLINICAL DATA: Right lower quadrant abdominal pain.

EXAM:
CT ABDOMEN AND PELVIS WITH CONTRAST
TECHNIQUE: Multidetector CT imaging of the abdomen and pelvis was performed
using the standard protocol following bolus administration of
intravenous contrast.

[Series 3: coronal st · coronal · 0.69mm/px · 3 of 117 slices shown]
[im 39/117  soft-tissue]
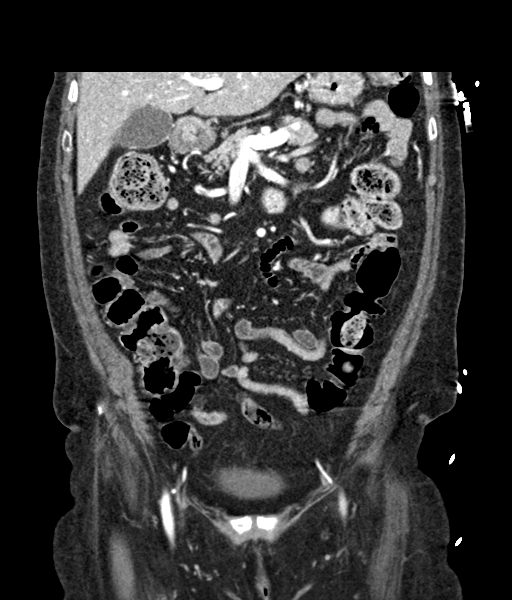
[im 52/117  soft-tissue]
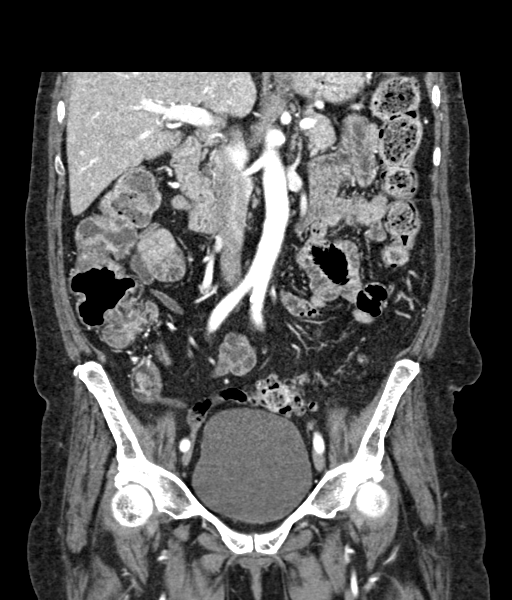
[im 65/117  soft-tissue]
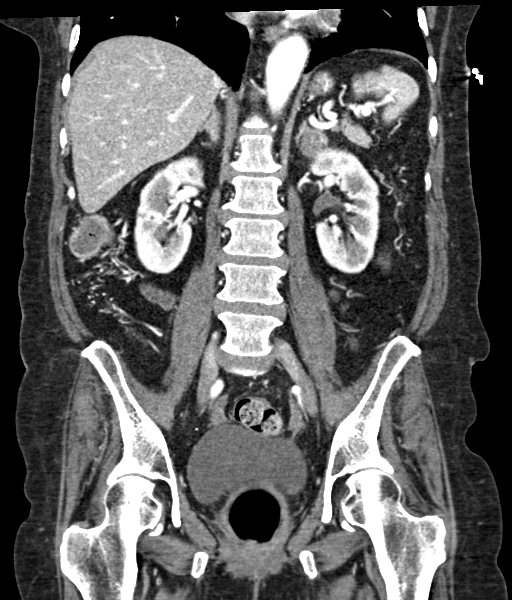

[Series 5: axial st · axial · 0.74mm/px · z∈[-660,-305]mm · 13 of 83 slices shown, 15 images]
[im 6/83  soft-tissue]
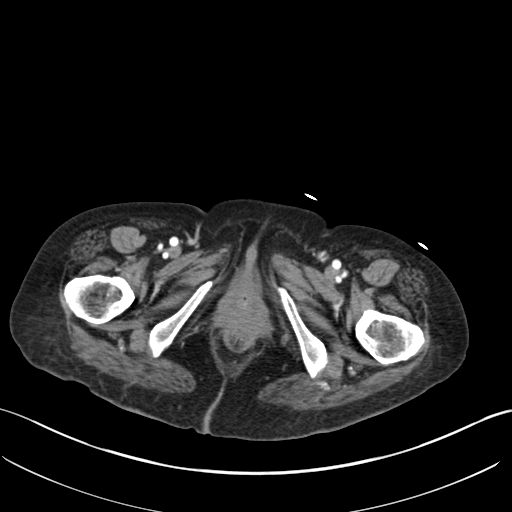
[im 6/83  bone]
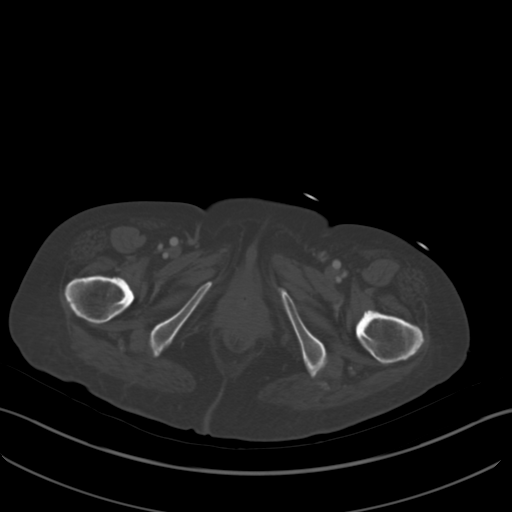
[im 11/83  soft-tissue]
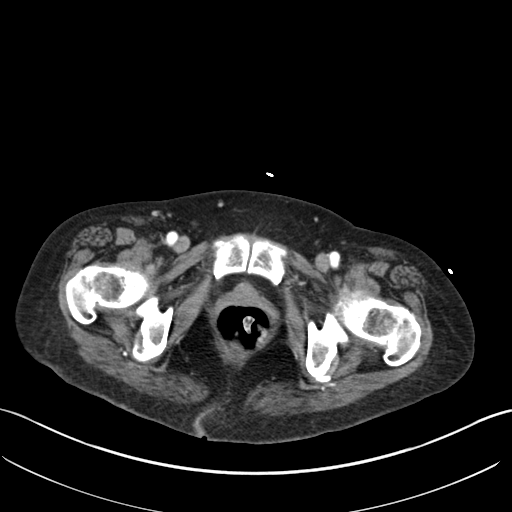
[im 17/83  soft-tissue]
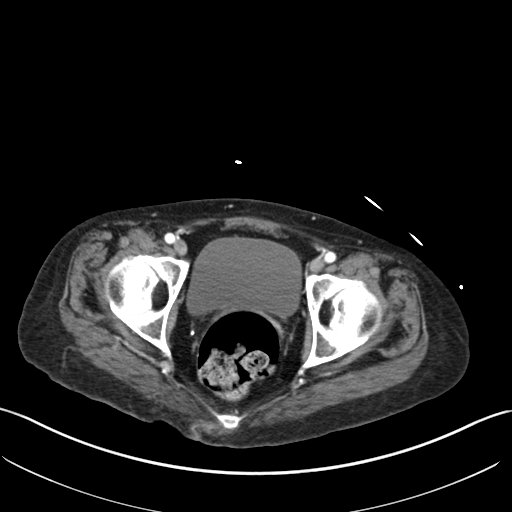
[im 22/83  soft-tissue]
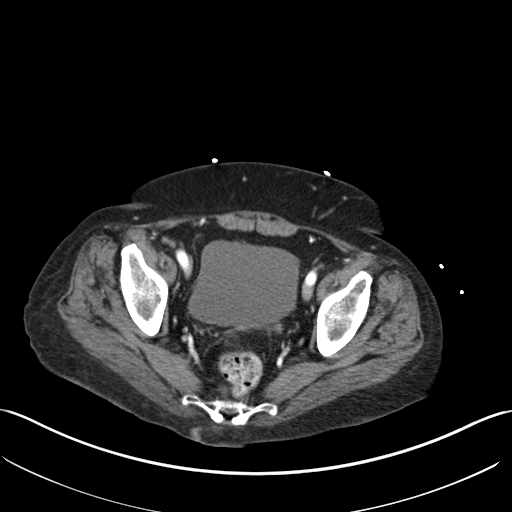
[im 28/83  soft-tissue]
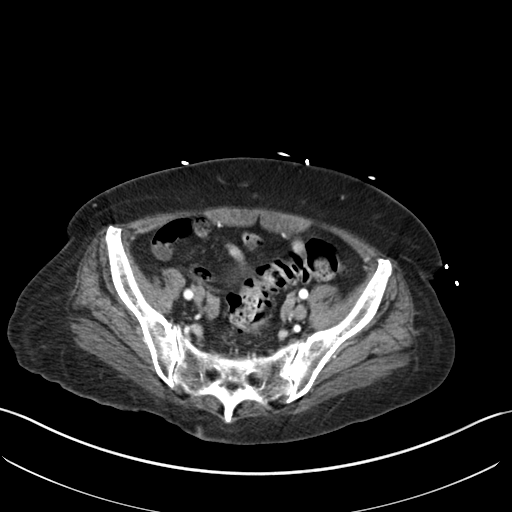
[im 33/83  soft-tissue]
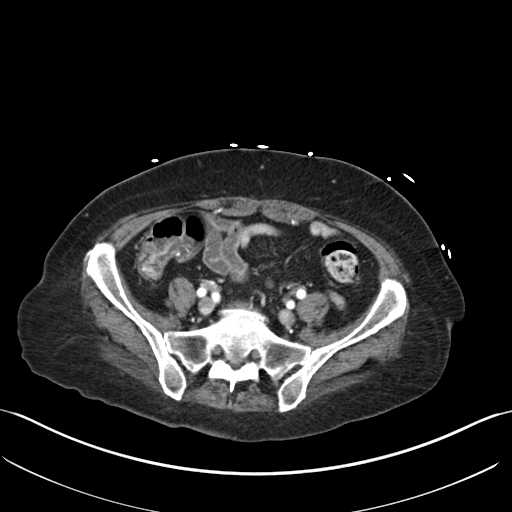
[im 44/83  soft-tissue]
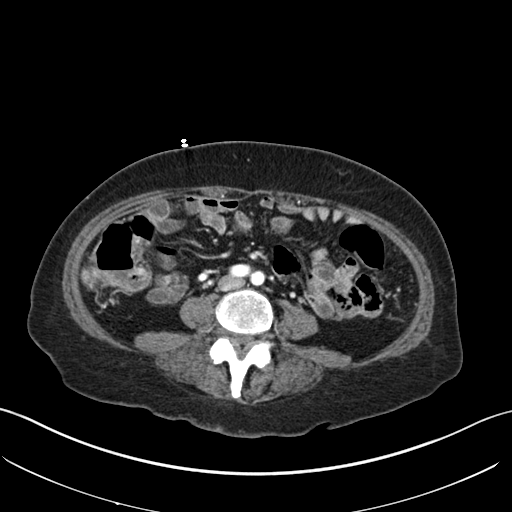
[im 50/83  soft-tissue]
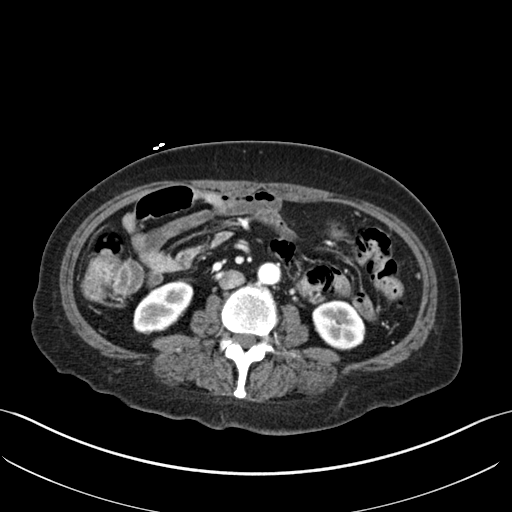
[im 55/83  soft-tissue]
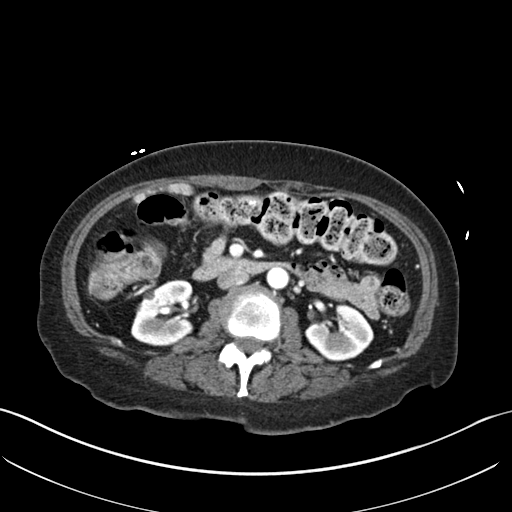
[im 55/83  bone]
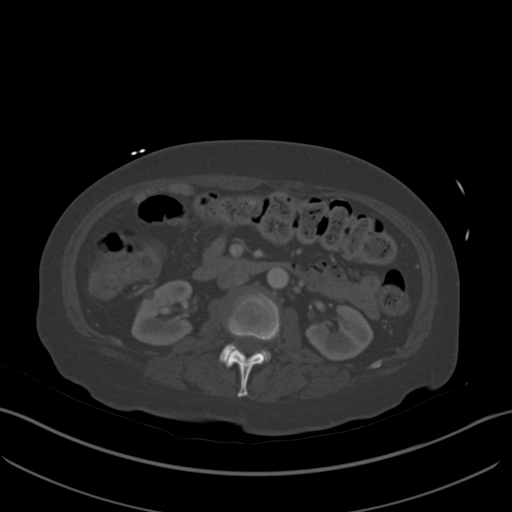
[im 61/83  soft-tissue]
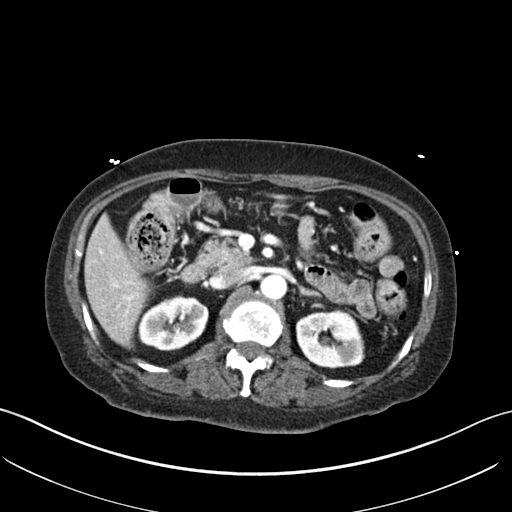
[im 66/83  soft-tissue]
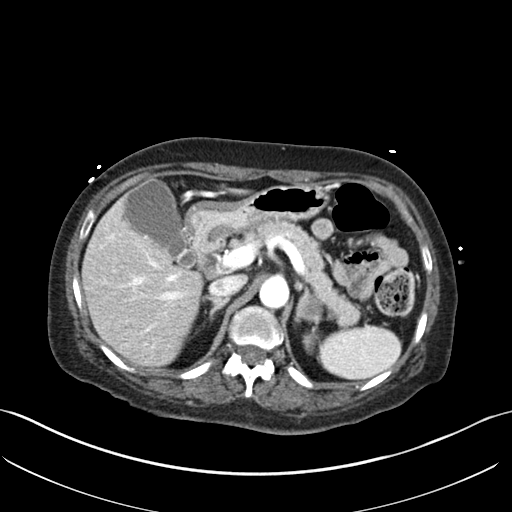
[im 72/83  soft-tissue]
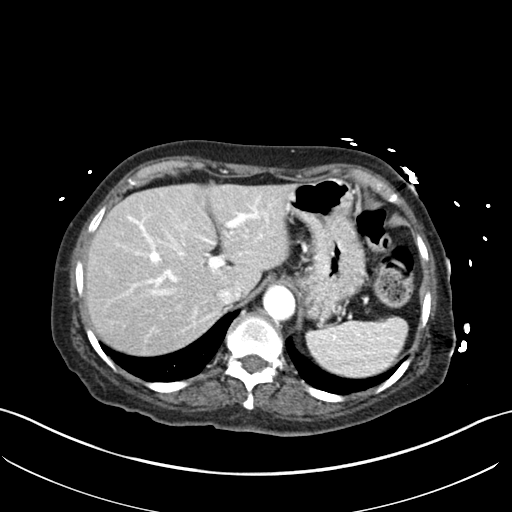
[im 77/83  soft-tissue]
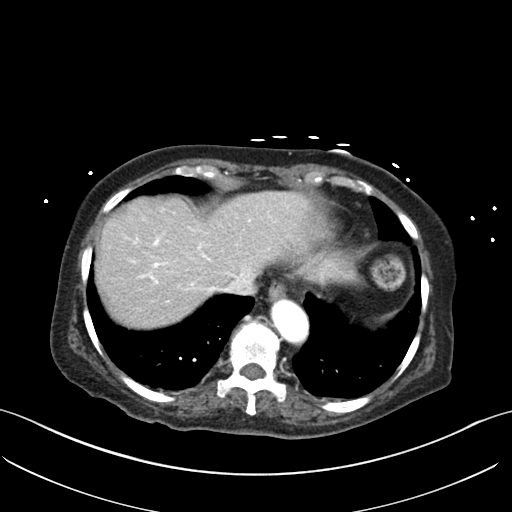

[16 of 46 positions shown; findings below may reference images not displayed]

RADIATION DOSE REDUCTION: This exam was performed according to the
departmental dose-optimization program which includes automated
exposure control, adjustment of the mA and/or kV according to
patient size and/or use of iterative reconstruction technique.

CONTRAST:  100mL OMNIPAQUE IOHEXOL 300 MG/ML  SOLN
FINDINGS: Lower chest: No acute abnormality.

Hepatobiliary: Gallstones are present. There is no biliary ductal
dilatation. There is diffuse fatty infiltration of the liver.

Pancreas: Unremarkable. No pancreatic ductal dilatation or
surrounding inflammatory changes.

Spleen: Normal in size without focal abnormality.

Adrenals/Urinary Tract: There is an indeterminate left adrenal
nodule measuring 54 Hounsfield units and 2.2 by 1.9 cm. The right
adrenal gland is within normal limits.

There is no hydronephrosis or perinephric fluid. Bladder is within
normal limits. There are rounded hypodensities within the right
kidney which are too small to characterize, most likely cysts.

Stomach/Bowel: Stomach is within normal limits. Appendix appears
normal. No evidence of bowel wall thickening, distention, or
inflammatory changes.

Vascular/Lymphatic: No significant vascular findings are present. No
enlarged abdominal or pelvic lymph nodes.

Reproductive: Status post hysterectomy. No adnexal masses.

Other: There is a small fat containing umbilical hernia. There is no
ascites.

Musculoskeletal: No acute or significant osseous findings.
IMPRESSION: 1. No acute localizing process in the abdomen or pelvis.
2. Cholelithiasis.
3. Fatty infiltration of the liver.
4. 2.2 cm left adrenal mass, probable benign adenoma. Recommend
adrenal washout CT or chemical shift MRI.
JACR [DATE]):[QF]-44, JCAT [QF] [REDACTED]; 40(2):194-200, Urol
J [QF] MASTINI; 3(2):71-4.

## 2021-08-10 MED ORDER — IOHEXOL 300 MG/ML  SOLN
100.0000 mL | Freq: Once | INTRAMUSCULAR | Status: AC | PRN
  Administered 2021-08-10: 100 mL via INTRAVENOUS

## 2021-08-10 MED ORDER — BISACODYL 10 MG RE SUPP: 10.0000 mg | Freq: Every day | RECTAL | 0 refills | Status: AC | PRN

## 2021-08-10 MED ORDER — ONDANSETRON HCL 4 MG/2ML IJ SOLN
4.0000 mg | Freq: Once | INTRAMUSCULAR | Status: AC
  Administered 2021-08-10: 4 mg via INTRAVENOUS
  Filled 2021-08-10: qty 2

## 2021-08-10 MED ORDER — BISACODYL 5 MG PO TBEC: 5.0000 mg | DELAYED_RELEASE_TABLET | Freq: Every day | ORAL | 0 refills | Status: AC | PRN

## 2021-08-10 MED ORDER — SODIUM CHLORIDE 0.9 % IV BOLUS
1000.0000 mL | Freq: Once | INTRAVENOUS | Status: AC
Start: 2021-08-10 — End: 2021-08-10
  Administered 2021-08-10: 1000 mL via INTRAVENOUS

## 2021-08-10 MED ORDER — TRAMADOL HCL 50 MG PO TABS: 50.0000 mg | ORAL_TABLET | Freq: Two times a day (BID) | ORAL | 0 refills | Status: AC | PRN

## 2021-08-10 NOTE — ED Provider Notes (Signed)
Pt received as signout from Dr Denton Lank ? ?61 yo female here with several days of nausea, constipation ?Hx of gallstones on prior imaging ? ?Pending labs, RUQ ultrasound ? ?* ? ?Per my record review, patient hospitalized for 3 weeks in Nov 2022 for generalized weakness, diagnosed with depression, possible new onset rheumatoid arthritis, per hospital course (discharge summary): ? ?"Carrie [name] was admitted to rehab 04/08/2021 for inpatient therapies to consist of PT, ST and OT at least three hours five days a week. Past admission physiatrist, therapy team and rehab RN have worked together to provide customized collaborative inpatient rehab.  Pertaining to patient's debility related to generalized pain/rheumatoid arthritis she would follow-up outpatient rheumatology services prednisone taper as indicated. " ? ?She lives with her nephew at home.  She has home health.  She reports she has no energy for the past several months, has chronic constipation (does not take narcotics), but came in today because of sharp abdominal pains unchanged from her ED visit on 08/06/21. ? ?RUQ ultrasound has biliary sludge but no other signs of cholecystitis per my personal review.  She has a negative murphy sign for me. ? ?CT abdomen pelvis ordered and personally reviewed showing no acute findings explain the patient's symptoms.  I reassessed the patient, and her niece is now at the bedside and wants to take her home.  I think the patient can follow-up with general surgery for possible biliary colic.  She has a rheumatology appointment on Wednesday, in 4 days, for her rheumatoid arthritis.  I would not adjust any of her rheumatoid medications at this time.  We discussed a bowel cleanout with laxatives at home.  She verbalized understanding ? ? ?  ?Terald Sleeper, MD ?08/10/21 1709 ? ?

## 2021-08-10 NOTE — ED Provider Notes (Signed)
[facility]-EMERGENCY DEPT Provider Note   CSN: [id number] Arrival date & time: 08/10/21  1353     History  Chief Complaint  Patient presents with   Abdominal Pain   Constipation   Urinary Frequency    TYNIAH KASTENS is a 61 y.o. female.  Patient c/o general weakness, right upper abd pain, nausea/vomiting, and some urinary frequency in the past several days. Symptoms gradual onset, moderate, persistent. No bloody or bilious emesis. No abd distension. Pt notes recent constipation but indicates small bm in past day.  No dysuria or hematuria. Denies any chest pain or chest discomfort. No sob. +non prod cough. No sore throat. No fever or chills. Denies change in meds or new meds.   The history is provided by the patient, medical records and the EMS personnel.  Abdominal Pain Associated symptoms: constipation, cough and vomiting   Associated symptoms: no chest pain, no dysuria, no fever, no shortness of breath and no sore throat   Constipation Associated symptoms: abdominal pain and vomiting   Associated symptoms: no back pain, no dysuria and no fever   Urinary Frequency Associated symptoms include abdominal pain. Pertinent negatives include no chest pain, no headaches and no shortness of breath.      Home Medications Prior to Admission medications   Medication Sig Start Date End Date Taking? Authorizing Provider  acetaminophen (TYLENOL) 325 MG tablet Take 2 tablets (650 mg total) by mouth every 6 (six) hours as needed for mild pain (or Fever >/= 101). 04/29/21   Angiulli, Mcarthur Rossetti, PA-C  ARIPiprazole (ABILIFY) 5 MG tablet Take 5 mg by mouth daily.    [provider]  buPROPion (WELLBUTRIN XL) 300 MG 24 hr tablet Take 1 tablet (300 mg total) by mouth daily. 05/26/21   Seabron Spates R, DO  escitalopram (LEXAPRO) 20 MG tablet Take 1 tablet (20 mg total) by mouth daily. 05/26/21   Seabron Spates R, DO  fluticasone (FLONASE) 50 MCG/ACT nasal spray  Place 2 sprays into both nostrils daily. Patient taking differently: Place 2 sprays into both nostrils daily as needed for allergies. 12/24/20   Seabron Spates R, DO  lisinopril (ZESTRIL) 20 MG tablet Take 1 tablet (20 mg total) by mouth daily. 05/26/21   Donato Schultz, DO  loratadine (CLARITIN) 10 MG tablet Take 1 tablet (10 mg total) by mouth daily. 12/24/20   Donato Schultz, DO  pantoprazole (PROTONIX) 40 MG tablet Take 1 tablet (40 mg total) by mouth daily. 05/26/21   Donato Schultz, DO  predniSONE (DELTASONE) 10 MG tablet Take 40 mg by mouth daily. 06/20/21   [provider]  senna-docusate (SENOKOT-S) 8.6-50 MG tablet Take 1 tablet by mouth 2 (two) times daily. 04/29/21   Angiulli, Mcarthur Rossetti, PA-C  traMADol (ULTRAM) 50 MG tablet Take 1 tablet (50 mg total) by mouth every 12 (twelve) hours as needed for moderate pain. 05/26/21   Donato Schultz, DO  Vitamin D3 (VITAMIN D) 25 MCG tablet Take 1 tablet (1,000 Units total) by mouth daily. 05/26/21   Donato Schultz, DO      Allergies    Zoloft [sertraline hcl]    Review of Systems   Review of Systems  Constitutional:  Negative for fever.  HENT:  Negative for sore throat.   Eyes:  Negative for visual disturbance.  Respiratory:  Positive for cough. Negative for shortness of breath.   Cardiovascular:  Negative for chest pain  and leg swelling.  Gastrointestinal:  Positive for abdominal pain, constipation and vomiting.  Genitourinary:  Positive for frequency. Negative for dysuria and flank pain.  Musculoskeletal:  Negative for back pain and neck pain.  Skin:  Negative for rash.  Neurological:  Negative for headaches.  Hematological:  Does not bruise/bleed easily.  Psychiatric/Behavioral:  Negative for confusion.    Physical Exam Updated Vital Signs BP 113/86 (BP Location: Left Arm)    Pulse 94    Temp 98.1 F (36.7 C) (Oral)    Resp 20    Ht 1.651 m ( )    Wt 63.5 kg    SpO2 96%    BMI 23.30  kg/m  Physical Exam Vitals and nursing note reviewed.  Constitutional:      Appearance: Normal appearance. She is well-developed.  HENT:     Head: Atraumatic.     Nose: Nose normal.     Mouth/Throat:     Mouth: Mucous membranes are moist.  Eyes:     General: No scleral icterus.    Conjunctiva/sclera: Conjunctivae normal.     Pupils: Pupils are equal, round, and reactive to light.  Neck:     Trachea: No tracheal deviation.  Cardiovascular:     Rate and Rhythm: Normal rate and regular rhythm.     Pulses: Normal pulses.     Heart sounds: Normal heart sounds. No murmur heard.   No friction rub. No gallop.  Pulmonary:     Effort: Pulmonary effort is normal. No respiratory distress.     Breath sounds: Normal breath sounds.  Abdominal:     General: Bowel sounds are normal. There is no distension.     Palpations: Abdomen is soft.     Tenderness: There is abdominal tenderness. There is no guarding.     Comments: Mild upper abd tenderness.   Genitourinary:    Comments: No cva tenderness.  Musculoskeletal:        General: No swelling or tenderness.     Cervical back: Normal range of motion and neck supple. No rigidity. No muscular tenderness.     Right lower leg: No edema.     Left lower leg: No edema.     Comments: Superficial wound, ~ 1.5 cm diameter to dorsum right hand in region 2nd MCP - notes was abrasion/wound from fall - clean, no sign of infection.   Skin:    General: Skin is warm and dry.     Findings: No rash.  Neurological:     Mental Status: She is alert.     Comments: Alert, speech normal. Motor/sens grossly intact bil.   Psychiatric:        Mood and Affect: Mood normal.    ED Results / Procedures / Treatments   Labs (all labs ordered are listed, but only abnormal results are displayed) Results for orders placed or performed during the hospital encounter of 08/06/21  Resp Panel by RT-PCR (Flu A&B, Covid) Nasopharyngeal Swab   Specimen: Nasopharyngeal Swab;  Nasopharyngeal(NP) swabs in vial transport medium  Result Value Ref Range   SARS Coronavirus 2 by RT PCR NEGATIVE NEGATIVE   Influenza A by PCR NEGATIVE NEGATIVE   Influenza B by PCR NEGATIVE NEGATIVE  Lipase, blood  Result Value Ref Range   Lipase 40 11 - 51 U/L  Comprehensive metabolic panel  Result Value Ref Range   Sodium 131 (L) 135 - 145 mmol/L   Potassium 4.2 3.5 - 5.1 mmol/L   Chloride 102 98 -  111 mmol/L   CO2 19 (L) 22 - 32 mmol/L   Glucose, Bld 201 (H) 70 - 99 mg/dL   BUN 22 8 - 23 mg/dL   Creatinine, Ser 9.561.38 (H) 0.44 - 1.00 mg/dL   Calcium 21.310.3 8.9 - 08.610.3 mg/dL   Total Protein 6.7 6.5 - 8.1 g/dL   Albumin 3.7 3.5 - 5.0 g/dL   AST 25 15 - 41 U/L   ALT 19 0 - 44 U/L   Alkaline Phosphatase 63 38 - 126 U/L   Total Bilirubin 1.0 0.3 - 1.2 mg/dL   GFR, Estimated 43 (L) >60 mL/min   Anion gap 10 5 - 15  CBC with Differential  Result Value Ref Range   WBC 6.5 4.0 - 10.5 K/uL   RBC 4.31 3.87 - 5.11 MIL/uL   Hemoglobin 13.5 12.0 - 15.0 g/dL   HCT 57.839.7 46.936.0 - 62.946.0 %   MCV 92.1 80.0 - 100.0 fL   MCH 31.3 26.0 - 34.0 pg   MCHC 34.0 30.0 - 36.0 g/dL   RDW 52.815.5 41.311.5 - 24.415.5 %   Platelets 182 150 - 400 K/uL   nRBC 0.0 0.0 - 0.2 %   Neutrophils Relative % 92 %   Neutro Abs 6.0 1.7 - 7.7 K/uL   Lymphocytes Relative 5 %   Lymphs Abs 0.3 (L) 0.7 - 4.0 K/uL   Monocytes Relative 2 %   Monocytes Absolute 0.1 0.1 - 1.0 K/uL   Eosinophils Relative 0 %   Eosinophils Absolute 0.0 0.0 - 0.5 K/uL   Basophils Relative 0 %   Basophils Absolute 0.0 0.0 - 0.1 K/uL   WBC Morphology TOXIC GRANULATION    Immature Granulocytes 1 %   Abs Immature Granulocytes 0.06 0.00 - 0.07 K/uL  Urinalysis, Routine w reflex microscopic  Result Value Ref Range   Color, Urine AMBER (A) YELLOW   APPearance HAZY (A) CLEAR   Specific Gravity, Urine 1.031 (H) 1.005 - 1.030   pH 5.0 5.0 - 8.0   Glucose, UA NEGATIVE NEGATIVE mg/dL   Hgb urine dipstick NEGATIVE NEGATIVE   Bilirubin Urine NEGATIVE NEGATIVE    Ketones, ur 5 (A) NEGATIVE mg/dL   Protein, ur 010100 (A) NEGATIVE mg/dL   Nitrite NEGATIVE NEGATIVE   Leukocytes,Ua NEGATIVE NEGATIVE   RBC / HPF 0-5 0 - 5 RBC/hpf   WBC, UA 0-5 0 - 5 WBC/hpf   Bacteria, UA NONE SEEN NONE SEEN   Squamous Epithelial / LPF 0-5 0 - 5   Mucus PRESENT    Hyaline Casts, UA PRESENT    Ca Oxalate Crys, UA PRESENT   CK  Result Value Ref Range   Total CK 34 (L) 38 - 234 U/L  Lactic acid, plasma  Result Value Ref Range   Lactic Acid, Venous 2.0 (HH) 0.5 - 1.9 mmol/L  Troponin I (High Sensitivity)  Result Value Ref Range   Troponin I (High Sensitivity) 6 <18 ng/L  Troponin I (High Sensitivity)  Result Value Ref Range   Troponin I (High Sensitivity) 4 <18 ng/L   DG Chest 1 View  Result Date: 08/06/2021 CLINICAL DATA:  Dizziness, nausea and vomiting, bilateral leg pain EXAM: CHEST  1 VIEW COMPARISON:  04/16/2021 FINDINGS: Single frontal view of the chest demonstrates an unremarkable cardiac silhouette. No acute airspace disease, effusion, or pneumothorax. No acute bony abnormalities. IMPRESSION: 1. No acute intrathoracic process. Electronically Signed   By: Sharlet SalinaMichael  Brown M.D.   On: 08/06/2021 19:41   CT Head Wo Contrast  Result Date: 08/06/2021 CLINICAL DATA:  Dizziness, nausea and vomiting EXAM: CT HEAD WITHOUT CONTRAST TECHNIQUE: Contiguous axial images were obtained from the base of the skull through the vertex without intravenous contrast. RADIATION DOSE REDUCTION: This exam was performed according to the departmental dose-optimization program which includes automated exposure control, adjustment of the mA and/or kV according to patient size and/or use of iterative reconstruction technique. COMPARISON:  03/26/2021, 04/17/2021 FINDINGS: Brain: No acute infarct or hemorrhage. Lateral ventricles and midline structures are stable. No acute extra-axial fluid collections. No mass effect. Vascular: No hyperdense vessel or unexpected calcification. Skull: Normal.  Negative for fracture or focal lesion. Sinuses/Orbits: No acute finding. Other: None. IMPRESSION: 1. No acute intracranial process. Electronically Signed   By: Sharlet Salina M.D.   On: 08/06/2021 19:40     EKG None  Radiology No results found.  Procedures Procedures    Medications Ordered in ED Medications  sodium chloride 0.9 % bolus 1,000 mL (has no administration in time range)  ondansetron (ZOFRAN) injection 4 mg (has no administration in time range)    ED Course/ Medical Decision Making/ A&P                           Medical Decision Making Amount and/or Complexity of Data Reviewed Labs: ordered. Radiology: ordered. ECG/medicine tests: ordered.  Risk Prescription drug management.   Iv ns bolus. Stat labs. Imaging.   Reviewed nursing notes and prior charts for additional history. External reports reviewed. Recent ct reviewed.  On CT chest from 06/24/21 - gallstones noted.   Labs and imaging pending.   1515, signed out to Dr Renaye Rakers to check labs and imaging, recheck pt, and disposition appropriately.            Final Clinical Impression(s) / ED Diagnoses Final diagnoses:  Pain    Rx / DC Orders ED Discharge Orders     None         Cathren Laine, MD 08/10/21 1515

## 2021-08-10 NOTE — ED Triage Notes (Addendum)
Pt from home via EMS with c/o abdominal pain, n/v, urinary frequency, and feeling generally unwell x 4 days. Patient reports not having a bowel movement for 4 days. Pt has wound on right hand as well. Hx of RA. ?

## 2021-08-11 ENCOUNTER — Telehealth: Payer: Self-pay | Admitting: Family Medicine

## 2021-08-11 NOTE — Telephone Encounter (Signed)
Pt's niece schedule appointment 3/16 @1120 ! ? ?Carrie CockingHelen [name] would like to discuss pt's health before upcoming appointment  ? ?Please advise  ? ?[phone] ?

## 2021-08-11 NOTE — Telephone Encounter (Signed)
She is not on patient's DPR ?

## 2021-08-13 DIAGNOSIS — M0579 Rheumatoid arthritis with rheumatoid factor of multiple sites without organ or systems involvement: Secondary | ICD-10-CM | POA: Diagnosis not present

## 2021-08-13 DIAGNOSIS — M3390 Dermatopolymyositis, unspecified, organ involvement unspecified: Secondary | ICD-10-CM | POA: Diagnosis not present

## 2021-08-13 DIAGNOSIS — K760 Fatty (change of) liver, not elsewhere classified: Secondary | ICD-10-CM | POA: Diagnosis not present

## 2021-08-13 DIAGNOSIS — Z79899 Other long term (current) drug therapy: Secondary | ICD-10-CM | POA: Diagnosis not present

## 2021-08-14 ENCOUNTER — Other Ambulatory Visit: Payer: Self-pay | Admitting: Family Medicine

## 2021-08-14 ENCOUNTER — Encounter: Payer: Federal, State, Local not specified - PPO | Admitting: Diagnostic Neuroimaging

## 2021-08-14 ENCOUNTER — Ambulatory Visit (INDEPENDENT_AMBULATORY_CARE_PROVIDER_SITE_OTHER): Payer: Federal, State, Local not specified - PPO | Admitting: Diagnostic Neuroimaging

## 2021-08-14 DIAGNOSIS — R1900 Intra-abdominal and pelvic swelling, mass and lump, unspecified site: Secondary | ICD-10-CM

## 2021-08-14 DIAGNOSIS — Z0289 Encounter for other administrative examinations: Secondary | ICD-10-CM

## 2021-08-14 DIAGNOSIS — R531 Weakness: Secondary | ICD-10-CM

## 2021-08-14 DIAGNOSIS — M069 Rheumatoid arthritis, unspecified: Secondary | ICD-10-CM

## 2021-08-14 NOTE — Procedures (Signed)
? ?[facility] ? ?NCS (NERVE CONDUCTION STUDY) WITH EMG (ELECTROMYOGRAPHY) REPORT ? ? ?STUDY DATE: 08/13/21 ?PATIENT NAME: Carrie [name] ?DOB: 01/28/1961 ?MRN: [id number] ? ?ORDERING CLINICIAN: Alric Ran, MD  ? ?TECHNOLOGIST: Sherre Scarlet ?ELECTROMYOGRAPHER: Braidon Chermak R. Dandria Griego, MD ? ?CLINICAL INFORMATION: 61 year old female with extremity weakness. ? ?FINDINGS: ?NERVE CONDUCTION STUDY: ? ?Bilateral peroneal motor responses have decreased amplitudes and normal conduction velocities. ? ?Bilateral tibial motor responses are normal.  Bilateral tibial F wave latencies are normal. ? ?Bilateral sural and superficial peroneal sensory responses are normal. ? ? ?NEEDLE ELECTROMYOGRAPHY: ? ?Needle examination of left lower extremity notable for early recruitment of polyphasic, small amplitude motor units in the left tibialis anterior and left gastrocnemius muscles.  Left vastus medialis has normal recruitment of small polyphasic motor units. ? ? ?IMPRESSION:  ? 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  Decreased amplitude of bilateral peroneal motor responses.  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 ? ? ? ?INTERPRETING PHYSICIAN:  ?Penni Bombard, MD ?Certified in Neurology, Neurophysiology and Neuroimaging ? ?[facility] ?[address], [address] ?[place], [place] [address] ?[phone] ? ?[place] ?   ?Nerve / Sites Muscle Latency Ref. Amplitude Ref. Rel Amp Segments Distance Velocity Ref. Area  ?  ms ms mV mV %  cm m/s m/s mVms  ?L Peroneal - EDB  ?   Ankle EDB 5.3 ?6.5 0.7 ?2.0 100 Ankle - EDB 9   3.2  ?   Fib head EDB 11.7  0.5  74.8 Fib head - Ankle 28 44 ?44 1.6  ?   Pop fossa EDB 14.0  0.5  94.6 Pop fossa - Fib head 10 44 ?44 2.0  ?       Pop fossa - Ankle      ?R Peroneal - EDB  ?   Ankle EDB 4.5 ?6.5 1.5 ?2.0 100 Ankle - EDB 9   8.5  ?   Fib head EDB 11.1  1.2  81.8 Fib head - Ankle 29 44 ?44 7.5  ?   Pop  fossa EDB 13.4  1.2  96.5 Pop fossa - Fib head 10 44 ?44 7.2  ?       Pop fossa - Ankle      ?L Tibial - AH  ?   Ankle AH 4.4 ?5.8 5.1 ?4.0 100 Ankle - AH 9   17.3  ?   Pop fossa AH 14.2  4.0  79.9 Pop fossa - Ankle 40 41 ?41 13.9  ?R Tibial - AH  ?   Ankle AH 3.9 ?5.8 5.9 ?4.0 100 Ankle - AH 9   13.9  ?   Pop fossa AH 13.3  4.0  68.4 Pop fossa - Ankle 39 41 ?41 15.1  ?           ?[place] ?   ?Nerve / Sites Rec. Site Peak Lat Ref.  Amp Ref. Segments Distance  ?  ms ms ?V ?V  cm  ?L Sural - Ankle (Calf)  ?   Calf Ankle 4.1 ?4.4 10 ?6 Calf - Ankle 14  ?R Sural - Ankle (Calf)  ?   Calf Ankle 4.4 ?4.4 10 ?6 Calf - Ankle 14  ?L Superficial peroneal - Ankle  ?   Lat leg Ankle 4.0 ?4.4 6 ?6 Lat leg - Ankle 14  ?R Superficial peroneal - Ankle  ?   Lat leg Ankle 4.2 ?4.4 10 ?6 Lat  leg - Ankle 14  ?           ?F  Wave ?   ?Nerve F Lat Ref.  ? ms ms  ?L Tibial - AH 53.3 ?56.0  ?R Tibial - AH 53.8 ?56.0  ?       ?EMG Summary Table   ? Spontaneous MUAP Recruitment  ?Muscle IA Fib PSW Fasc Other Amp Dur. Poly Pattern  ?L. Tibialis anterior Normal None None None _______ Decreased Normal 1+ Early  ?L. Gastrocnemius (Medial head) Normal None None None _______ Decreased Normal 1+ Early  ?L. Vastus medialis Normal None None None _______ Decreased Normal 1+ Normal  ? ?  ? ? ?

## 2021-08-15 ENCOUNTER — Telehealth: Payer: Self-pay | Admitting: Family Medicine

## 2021-08-15 DIAGNOSIS — Z87891 Personal history of nicotine dependence: Secondary | ICD-10-CM | POA: Diagnosis not present

## 2021-08-15 DIAGNOSIS — Z79899 Other long term (current) drug therapy: Secondary | ICD-10-CM | POA: Diagnosis not present

## 2021-08-15 DIAGNOSIS — R5381 Other malaise: Secondary | ICD-10-CM | POA: Diagnosis not present

## 2021-08-15 DIAGNOSIS — M069 Rheumatoid arthritis, unspecified: Secondary | ICD-10-CM | POA: Diagnosis not present

## 2021-08-15 DIAGNOSIS — F32A Depression, unspecified: Secondary | ICD-10-CM | POA: Diagnosis not present

## 2021-08-15 DIAGNOSIS — E785 Hyperlipidemia, unspecified: Secondary | ICD-10-CM | POA: Diagnosis not present

## 2021-08-15 DIAGNOSIS — I10 Essential (primary) hypertension: Secondary | ICD-10-CM | POA: Diagnosis not present

## 2021-08-15 DIAGNOSIS — Z7952 Long term (current) use of systemic steroids: Secondary | ICD-10-CM | POA: Diagnosis not present

## 2021-08-15 DIAGNOSIS — E871 Hypo-osmolality and hyponatremia: Secondary | ICD-10-CM | POA: Diagnosis not present

## 2021-08-15 DIAGNOSIS — Z9181 History of falling: Secondary | ICD-10-CM | POA: Diagnosis not present

## 2021-08-15 NOTE — Telephone Encounter (Signed)
Carrie [name] called from [facility] for verbal orders for pt  ? ?Verbal orders for wound care  ?Cleaning wound cleanser  ?Pat & dry  ?Calcium alginate with silver  ?Dry dressing  ? ?Please advise  ?[phone] ?

## 2021-08-15 NOTE — Telephone Encounter (Signed)
Verbal given 

## 2021-08-17 ENCOUNTER — Emergency Department (HOSPITAL_COMMUNITY): Payer: Federal, State, Local not specified - PPO

## 2021-08-17 ENCOUNTER — Inpatient Hospital Stay (HOSPITAL_COMMUNITY)
Admission: EM | Admit: 2021-08-17 | Discharge: 2021-10-06 | DRG: 004 | Disposition: E | Payer: Federal, State, Local not specified - PPO | Attending: Internal Medicine | Admitting: Internal Medicine

## 2021-08-17 ENCOUNTER — Other Ambulatory Visit: Payer: Self-pay

## 2021-08-17 ENCOUNTER — Encounter (HOSPITAL_COMMUNITY): Payer: Self-pay

## 2021-08-17 DIAGNOSIS — Z90710 Acquired absence of both cervix and uterus: Secondary | ICD-10-CM

## 2021-08-17 DIAGNOSIS — E1165 Type 2 diabetes mellitus with hyperglycemia: Secondary | ICD-10-CM | POA: Diagnosis not present

## 2021-08-17 DIAGNOSIS — D701 Agranulocytosis secondary to cancer chemotherapy: Secondary | ICD-10-CM | POA: Diagnosis not present

## 2021-08-17 DIAGNOSIS — Z888 Allergy status to other drugs, medicaments and biological substances status: Secondary | ICD-10-CM

## 2021-08-17 DIAGNOSIS — Z79899 Other long term (current) drug therapy: Secondary | ICD-10-CM

## 2021-08-17 DIAGNOSIS — R000 Tachycardia, unspecified: Secondary | ICD-10-CM | POA: Diagnosis not present

## 2021-08-17 DIAGNOSIS — N179 Acute kidney failure, unspecified: Secondary | ICD-10-CM | POA: Diagnosis not present

## 2021-08-17 DIAGNOSIS — M3313 Other dermatomyositis without myopathy: Secondary | ICD-10-CM | POA: Diagnosis present

## 2021-08-17 DIAGNOSIS — T451X5A Adverse effect of antineoplastic and immunosuppressive drugs, initial encounter: Secondary | ICD-10-CM | POA: Diagnosis present

## 2021-08-17 DIAGNOSIS — K7200 Acute and subacute hepatic failure without coma: Secondary | ICD-10-CM | POA: Diagnosis not present

## 2021-08-17 DIAGNOSIS — G0490 Encephalitis and encephalomyelitis, unspecified: Secondary | ICD-10-CM

## 2021-08-17 DIAGNOSIS — R410 Disorientation, unspecified: Secondary | ICD-10-CM | POA: Diagnosis not present

## 2021-08-17 DIAGNOSIS — D61810 Antineoplastic chemotherapy induced pancytopenia: Secondary | ICD-10-CM | POA: Diagnosis not present

## 2021-08-17 DIAGNOSIS — E279 Disorder of adrenal gland, unspecified: Secondary | ICD-10-CM | POA: Insufficient documentation

## 2021-08-17 DIAGNOSIS — J690 Pneumonitis due to inhalation of food and vomit: Secondary | ICD-10-CM | POA: Diagnosis not present

## 2021-08-17 DIAGNOSIS — E860 Dehydration: Secondary | ICD-10-CM | POA: Diagnosis present

## 2021-08-17 DIAGNOSIS — E876 Hypokalemia: Secondary | ICD-10-CM | POA: Diagnosis present

## 2021-08-17 DIAGNOSIS — Z66 Do not resuscitate: Secondary | ICD-10-CM | POA: Diagnosis not present

## 2021-08-17 DIAGNOSIS — M351 Other overlap syndromes: Secondary | ICD-10-CM | POA: Diagnosis present

## 2021-08-17 DIAGNOSIS — E785 Hyperlipidemia, unspecified: Secondary | ICD-10-CM | POA: Diagnosis present

## 2021-08-17 DIAGNOSIS — B49 Unspecified mycosis: Secondary | ICD-10-CM | POA: Diagnosis not present

## 2021-08-17 DIAGNOSIS — I9751 Accidental puncture and laceration of a circulatory system organ or structure during a circulatory system procedure: Secondary | ICD-10-CM | POA: Diagnosis not present

## 2021-08-17 DIAGNOSIS — B377 Candidal sepsis: Secondary | ICD-10-CM

## 2021-08-17 DIAGNOSIS — D61818 Other pancytopenia: Secondary | ICD-10-CM | POA: Diagnosis present

## 2021-08-17 DIAGNOSIS — I6782 Cerebral ischemia: Secondary | ICD-10-CM | POA: Diagnosis not present

## 2021-08-17 DIAGNOSIS — R531 Weakness: Secondary | ICD-10-CM

## 2021-08-17 DIAGNOSIS — D761 Hemophagocytic lymphohistiocytosis: Principal | ICD-10-CM | POA: Diagnosis present

## 2021-08-17 DIAGNOSIS — Z8619 Personal history of other infectious and parasitic diseases: Secondary | ICD-10-CM

## 2021-08-17 DIAGNOSIS — K068 Other specified disorders of gingiva and edentulous alveolar ridge: Secondary | ICD-10-CM | POA: Diagnosis not present

## 2021-08-17 DIAGNOSIS — F05 Delirium due to known physiological condition: Secondary | ICD-10-CM | POA: Diagnosis not present

## 2021-08-17 DIAGNOSIS — G9341 Metabolic encephalopathy: Secondary | ICD-10-CM | POA: Diagnosis present

## 2021-08-17 DIAGNOSIS — E8729 Other acidosis: Secondary | ICD-10-CM | POA: Diagnosis present

## 2021-08-17 DIAGNOSIS — D65 Disseminated intravascular coagulation [defibrination syndrome]: Secondary | ICD-10-CM | POA: Diagnosis present

## 2021-08-17 DIAGNOSIS — R413 Other amnesia: Secondary | ICD-10-CM | POA: Diagnosis present

## 2021-08-17 DIAGNOSIS — E872 Acidosis, unspecified: Secondary | ICD-10-CM | POA: Diagnosis not present

## 2021-08-17 DIAGNOSIS — K760 Fatty (change of) liver, not elsewhere classified: Secondary | ICD-10-CM | POA: Diagnosis present

## 2021-08-17 DIAGNOSIS — E871 Hypo-osmolality and hyponatremia: Secondary | ICD-10-CM | POA: Diagnosis not present

## 2021-08-17 DIAGNOSIS — T380X5A Adverse effect of glucocorticoids and synthetic analogues, initial encounter: Secondary | ICD-10-CM | POA: Diagnosis not present

## 2021-08-17 DIAGNOSIS — F840 Autistic disorder: Secondary | ICD-10-CM | POA: Diagnosis present

## 2021-08-17 DIAGNOSIS — Z781 Physical restraint status: Secondary | ICD-10-CM

## 2021-08-17 DIAGNOSIS — D649 Anemia, unspecified: Secondary | ICD-10-CM

## 2021-08-17 DIAGNOSIS — R197 Diarrhea, unspecified: Secondary | ICD-10-CM | POA: Diagnosis not present

## 2021-08-17 DIAGNOSIS — L8990 Pressure ulcer of unspecified site, unspecified stage: Secondary | ICD-10-CM | POA: Diagnosis present

## 2021-08-17 DIAGNOSIS — Z515 Encounter for palliative care: Secondary | ICD-10-CM

## 2021-08-17 DIAGNOSIS — R5081 Fever presenting with conditions classified elsewhere: Secondary | ICD-10-CM | POA: Diagnosis not present

## 2021-08-17 DIAGNOSIS — Z20822 Contact with and (suspected) exposure to covid-19: Secondary | ICD-10-CM | POA: Diagnosis present

## 2021-08-17 DIAGNOSIS — R319 Hematuria, unspecified: Secondary | ICD-10-CM | POA: Diagnosis not present

## 2021-08-17 DIAGNOSIS — N170 Acute kidney failure with tubular necrosis: Secondary | ICD-10-CM | POA: Diagnosis present

## 2021-08-17 DIAGNOSIS — E11649 Type 2 diabetes mellitus with hypoglycemia without coma: Secondary | ICD-10-CM | POA: Diagnosis not present

## 2021-08-17 DIAGNOSIS — R1084 Generalized abdominal pain: Secondary | ICD-10-CM | POA: Diagnosis not present

## 2021-08-17 DIAGNOSIS — J9601 Acute respiratory failure with hypoxia: Secondary | ICD-10-CM | POA: Diagnosis not present

## 2021-08-17 DIAGNOSIS — Z803 Family history of malignant neoplasm of breast: Secondary | ICD-10-CM

## 2021-08-17 DIAGNOSIS — R34 Anuria and oliguria: Secondary | ICD-10-CM | POA: Diagnosis not present

## 2021-08-17 DIAGNOSIS — D696 Thrombocytopenia, unspecified: Secondary | ICD-10-CM

## 2021-08-17 DIAGNOSIS — B488 Other specified mycoses: Secondary | ICD-10-CM | POA: Diagnosis not present

## 2021-08-17 DIAGNOSIS — Y848 Other medical procedures as the cause of abnormal reaction of the patient, or of later complication, without mention of misadventure at the time of the procedure: Secondary | ICD-10-CM | POA: Diagnosis not present

## 2021-08-17 DIAGNOSIS — R4182 Altered mental status, unspecified: Secondary | ICD-10-CM | POA: Diagnosis not present

## 2021-08-17 DIAGNOSIS — R570 Cardiogenic shock: Secondary | ICD-10-CM | POA: Diagnosis not present

## 2021-08-17 DIAGNOSIS — D84821 Immunodeficiency due to drugs: Secondary | ICD-10-CM | POA: Diagnosis present

## 2021-08-17 DIAGNOSIS — K625 Hemorrhage of anus and rectum: Secondary | ICD-10-CM | POA: Diagnosis not present

## 2021-08-17 DIAGNOSIS — R569 Unspecified convulsions: Secondary | ICD-10-CM | POA: Diagnosis not present

## 2021-08-17 DIAGNOSIS — I10 Essential (primary) hypertension: Secondary | ICD-10-CM | POA: Diagnosis present

## 2021-08-17 DIAGNOSIS — T80211A Bloodstream infection due to central venous catheter, initial encounter: Secondary | ICD-10-CM | POA: Diagnosis not present

## 2021-08-17 DIAGNOSIS — F419 Anxiety disorder, unspecified: Secondary | ICD-10-CM | POA: Diagnosis present

## 2021-08-17 DIAGNOSIS — S61401A Unspecified open wound of right hand, initial encounter: Secondary | ICD-10-CM | POA: Diagnosis present

## 2021-08-17 DIAGNOSIS — K5900 Constipation, unspecified: Secondary | ICD-10-CM | POA: Diagnosis not present

## 2021-08-17 DIAGNOSIS — M069 Rheumatoid arthritis, unspecified: Secondary | ICD-10-CM | POA: Diagnosis present

## 2021-08-17 DIAGNOSIS — R109 Unspecified abdominal pain: Secondary | ICD-10-CM | POA: Diagnosis not present

## 2021-08-17 DIAGNOSIS — Z7969 Long term (current) use of other immunomodulators and immunosuppressants: Secondary | ICD-10-CM

## 2021-08-17 DIAGNOSIS — A419 Sepsis, unspecified organism: Secondary | ICD-10-CM | POA: Diagnosis not present

## 2021-08-17 DIAGNOSIS — L89894 Pressure ulcer of other site, stage 4: Secondary | ICD-10-CM | POA: Diagnosis present

## 2021-08-17 DIAGNOSIS — E278 Other specified disorders of adrenal gland: Secondary | ICD-10-CM | POA: Diagnosis present

## 2021-08-17 DIAGNOSIS — E869 Volume depletion, unspecified: Secondary | ICD-10-CM | POA: Diagnosis present

## 2021-08-17 DIAGNOSIS — T4275XA Adverse effect of unspecified antiepileptic and sedative-hypnotic drugs, initial encounter: Secondary | ICD-10-CM | POA: Diagnosis not present

## 2021-08-17 DIAGNOSIS — L89154 Pressure ulcer of sacral region, stage 4: Secondary | ICD-10-CM | POA: Diagnosis present

## 2021-08-17 DIAGNOSIS — G9340 Encephalopathy, unspecified: Secondary | ICD-10-CM

## 2021-08-17 DIAGNOSIS — Y95 Nosocomial condition: Secondary | ICD-10-CM | POA: Diagnosis not present

## 2021-08-17 DIAGNOSIS — E870 Hyperosmolality and hypernatremia: Secondary | ICD-10-CM | POA: Diagnosis not present

## 2021-08-17 DIAGNOSIS — E874 Mixed disorder of acid-base balance: Secondary | ICD-10-CM | POA: Diagnosis present

## 2021-08-17 DIAGNOSIS — R079 Chest pain, unspecified: Secondary | ICD-10-CM | POA: Diagnosis not present

## 2021-08-17 DIAGNOSIS — Z8249 Family history of ischemic heart disease and other diseases of the circulatory system: Secondary | ICD-10-CM

## 2021-08-17 DIAGNOSIS — R6521 Severe sepsis with septic shock: Secondary | ICD-10-CM | POA: Diagnosis not present

## 2021-08-17 DIAGNOSIS — F32A Depression, unspecified: Secondary | ICD-10-CM | POA: Diagnosis present

## 2021-08-17 DIAGNOSIS — E538 Deficiency of other specified B group vitamins: Secondary | ICD-10-CM | POA: Diagnosis present

## 2021-08-17 DIAGNOSIS — F418 Other specified anxiety disorders: Secondary | ICD-10-CM | POA: Diagnosis present

## 2021-08-17 DIAGNOSIS — R451 Restlessness and agitation: Secondary | ICD-10-CM | POA: Diagnosis present

## 2021-08-17 DIAGNOSIS — Z833 Family history of diabetes mellitus: Secondary | ICD-10-CM

## 2021-08-17 DIAGNOSIS — L89153 Pressure ulcer of sacral region, stage 3: Secondary | ICD-10-CM

## 2021-08-17 DIAGNOSIS — Z7952 Long term (current) use of systemic steroids: Secondary | ICD-10-CM

## 2021-08-17 DIAGNOSIS — E878 Other disorders of electrolyte and fluid balance, not elsewhere classified: Secondary | ICD-10-CM | POA: Diagnosis present

## 2021-08-17 DIAGNOSIS — Z7189 Other specified counseling: Secondary | ICD-10-CM

## 2021-08-17 DIAGNOSIS — J189 Pneumonia, unspecified organism: Secondary | ICD-10-CM | POA: Diagnosis not present

## 2021-08-17 DIAGNOSIS — Z87891 Personal history of nicotine dependence: Secondary | ICD-10-CM

## 2021-08-17 DIAGNOSIS — G319 Degenerative disease of nervous system, unspecified: Secondary | ICD-10-CM | POA: Diagnosis not present

## 2021-08-17 DIAGNOSIS — R809 Proteinuria, unspecified: Secondary | ICD-10-CM | POA: Diagnosis present

## 2021-08-17 DIAGNOSIS — E8770 Fluid overload, unspecified: Secondary | ICD-10-CM | POA: Diagnosis not present

## 2021-08-17 DIAGNOSIS — K828 Other specified diseases of gallbladder: Secondary | ICD-10-CM | POA: Diagnosis present

## 2021-08-17 DIAGNOSIS — K5730 Diverticulosis of large intestine without perforation or abscess without bleeding: Secondary | ICD-10-CM | POA: Diagnosis not present

## 2021-08-17 HISTORY — DX: Rhabdomyolysis: M62.82

## 2021-08-17 LAB — URINALYSIS, ROUTINE W REFLEX MICROSCOPIC
Bacteria, UA: NONE SEEN
Bilirubin Urine: NEGATIVE
Glucose, UA: NEGATIVE mg/dL
Hgb urine dipstick: NEGATIVE
Ketones, ur: 5 mg/dL — AB
Leukocytes,Ua: NEGATIVE
Nitrite: NEGATIVE
Protein, ur: 30 mg/dL — AB
Specific Gravity, Urine: 1.04 — ABNORMAL HIGH (ref 1.005–1.030)
pH: 5 (ref 5.0–8.0)

## 2021-08-17 LAB — CBC
HCT: 31.5 % — ABNORMAL LOW (ref 36.0–46.0)
Hemoglobin: 10.7 g/dL — ABNORMAL LOW (ref 12.0–15.0)
MCH: 30.2 pg (ref 26.0–34.0)
MCHC: 34 g/dL (ref 30.0–36.0)
MCV: 89 fL (ref 80.0–100.0)
Platelets: 91 10*3/uL — ABNORMAL LOW (ref 150–400)
RBC: 3.54 MIL/uL — ABNORMAL LOW (ref 3.87–5.11)
RDW: 15.9 % — ABNORMAL HIGH (ref 11.5–15.5)
WBC: 3.7 10*3/uL — ABNORMAL LOW (ref 4.0–10.5)
nRBC: 0 % (ref 0.0–0.2)

## 2021-08-17 LAB — COMPREHENSIVE METABOLIC PANEL
ALT: 22 U/L (ref 0–44)
AST: 44 U/L — ABNORMAL HIGH (ref 15–41)
Albumin: 2.7 g/dL — ABNORMAL LOW (ref 3.5–5.0)
Alkaline Phosphatase: 61 U/L (ref 38–126)
Anion gap: 10 (ref 5–15)
BUN: 36 mg/dL — ABNORMAL HIGH (ref 8–23)
CO2: 17 mmol/L — ABNORMAL LOW (ref 22–32)
Calcium: 9.3 mg/dL (ref 8.9–10.3)
Chloride: 107 mmol/L (ref 98–111)
Creatinine, Ser: 1.37 mg/dL — ABNORMAL HIGH (ref 0.44–1.00)
GFR, Estimated: 44 mL/min — ABNORMAL LOW (ref >60)
Glucose, Bld: 113 mg/dL — ABNORMAL HIGH (ref 70–99)
Potassium: 3.7 mmol/L (ref 3.5–5.1)
Sodium: 134 mmol/L — ABNORMAL LOW (ref 135–145)
Total Bilirubin: 1.1 mg/dL (ref 0.3–1.2)
Total Protein: 5 g/dL — ABNORMAL LOW (ref 6.5–8.1)

## 2021-08-17 LAB — RESP PANEL BY RT-PCR (FLU A&B, COVID) ARPGX2
Influenza A by PCR: NEGATIVE
Influenza B by PCR: NEGATIVE
SARS Coronavirus 2 by RT PCR: NEGATIVE

## 2021-08-17 LAB — LIPASE, BLOOD: Lipase: 40 U/L (ref 11–51)

## 2021-08-17 LAB — MAGNESIUM: Magnesium: 1.9 mg/dL (ref 1.7–2.4)

## 2021-08-17 LAB — TROPONIN I (HIGH SENSITIVITY)
Troponin I (High Sensitivity): 11 ng/L (ref <18)
Troponin I (High Sensitivity): 14 ng/L (ref <18)

## 2021-08-17 LAB — GLUCOSE, CAPILLARY
Glucose-Capillary: 154 mg/dL — ABNORMAL HIGH (ref 70–99)
Glucose-Capillary: 99 mg/dL (ref 70–99)

## 2021-08-17 LAB — CK: Total CK: 49 U/L (ref 38–234)

## 2021-08-17 LAB — PHOSPHORUS: Phosphorus: 2.8 mg/dL (ref 2.5–4.6)

## 2021-08-17 LAB — RETICULOCYTES
Immature Retic Fract: 20 % — ABNORMAL HIGH (ref 2.3–15.9)
RBC.: 3.59 MIL/uL — ABNORMAL LOW (ref 3.87–5.11)
Retic Count, Absolute: 59.6 10*3/uL (ref 19.0–186.0)
Retic Ct Pct: 1.7 % (ref 0.4–3.1)

## 2021-08-17 LAB — BLOOD GAS, VENOUS
Acid-base deficit: 7.6 mmol/L — ABNORMAL HIGH (ref 0.0–2.0)
Bicarbonate: 16.2 mmol/L — ABNORMAL LOW (ref 20.0–28.0)
O2 Saturation: 57.1 %
Patient temperature: 36.6
pCO2, Ven: 27 mmHg — ABNORMAL LOW (ref 44–60)
pH, Ven: 7.38 (ref 7.25–7.43)
pO2, Ven: 34 mmHg (ref 32–45)

## 2021-08-17 LAB — PROTIME-INR
INR: 1.1 (ref 0.8–1.2)
Prothrombin Time: 14.2 seconds (ref 11.4–15.2)

## 2021-08-17 LAB — LACTIC ACID, PLASMA
Lactic Acid, Venous: 3.1 mmol/L (ref 0.5–1.9)
Lactic Acid, Venous: 2 mmol/L (ref 0.5–1.9)

## 2021-08-17 LAB — AMMONIA: Ammonia: 23 umol/L (ref 9–35)

## 2021-08-17 LAB — FOLATE: Folate: 3.8 ng/mL — ABNORMAL LOW (ref >5.9)

## 2021-08-17 IMAGING — CT CT HEAD W/O CM
2 of 4 series · 11 of 47 positions shown, 13 images · non-contrast
Comparison: Head CT [DATE].

CLINICAL DATA: 60-year-old female with history of delirium. Lower
abdominal pain and constipation.



[Series 5: coronal soft tissue · coronal · 0.29mm/px · 3 of 75 slices shown]
[im 25/75  brain]
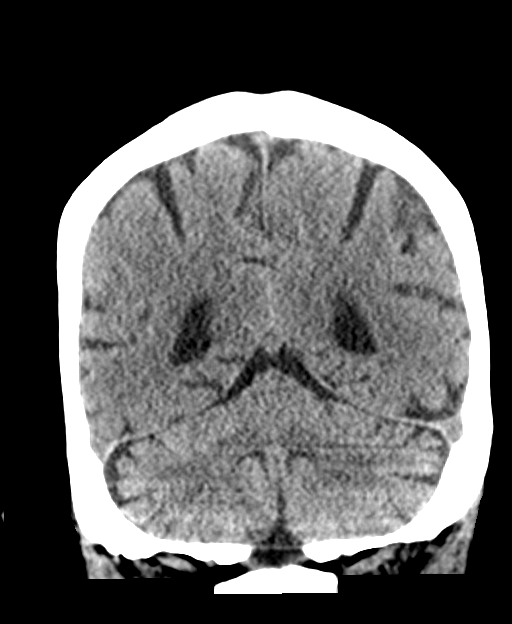
[im 33/75  brain]
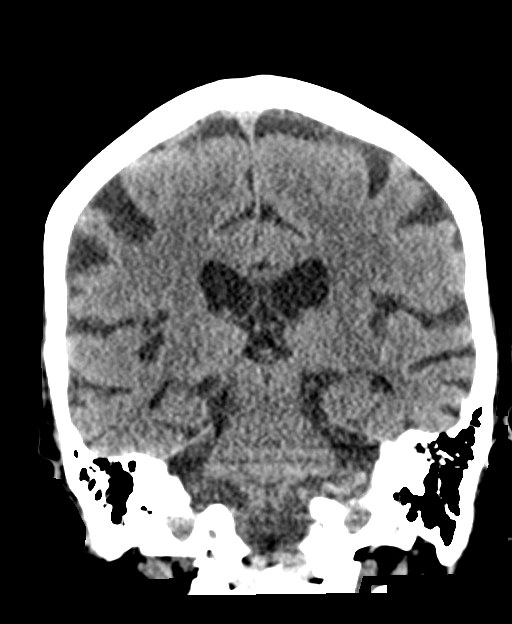
[im 42/75  brain]
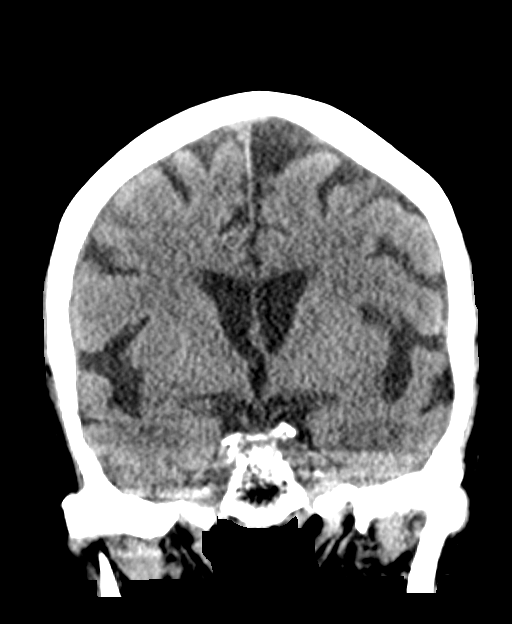

[Series 7: true axial · axial · 0.29mm/px · z∈[-153,-23]mm · 8 of 58 slices shown, 10 images]
[im 7/58  brain]
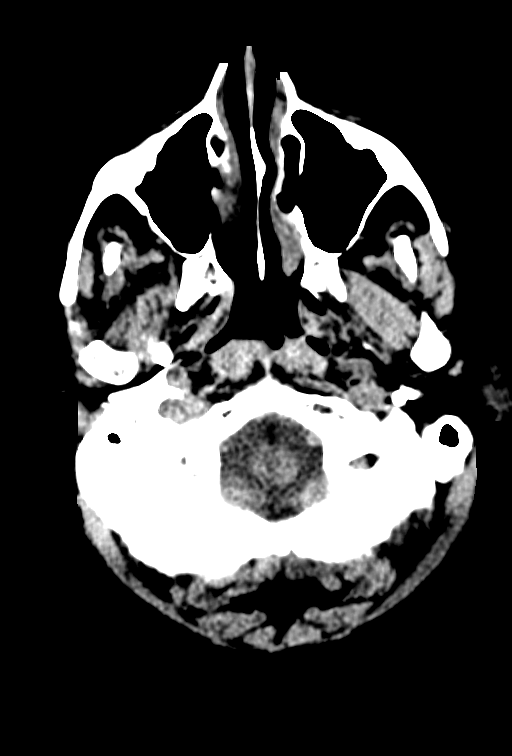
[im 7/58  bone]
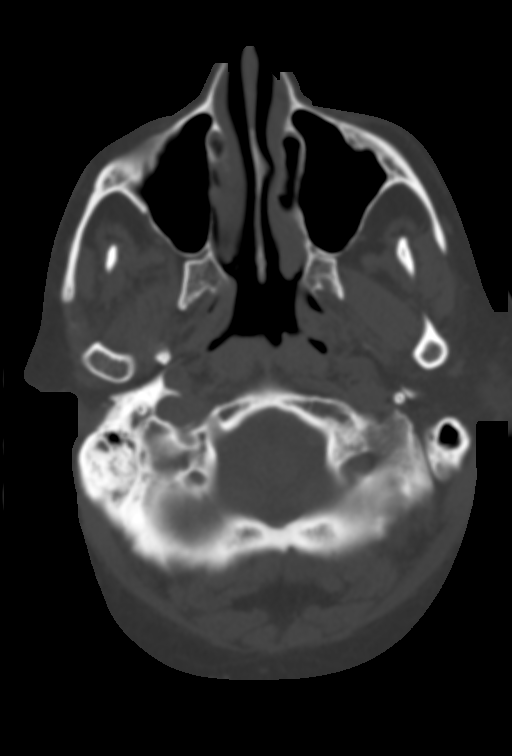
[im 13/58  brain]
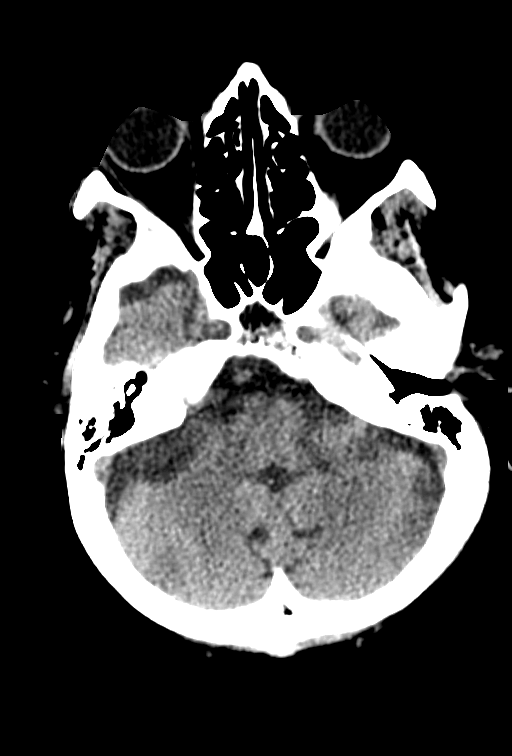
[im 20/58  brain]
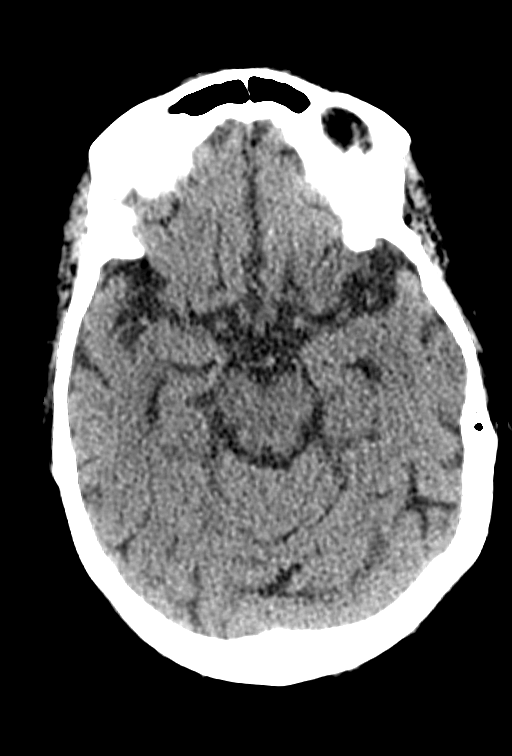
[im 26/58  brain]
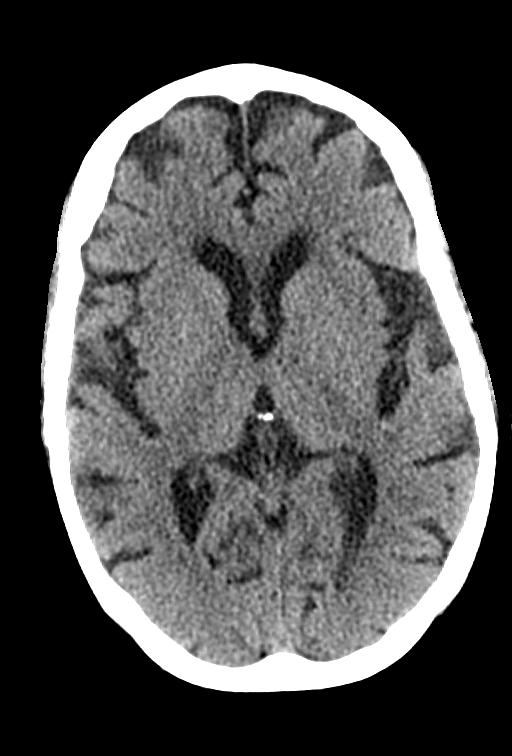
[im 32/58  brain]
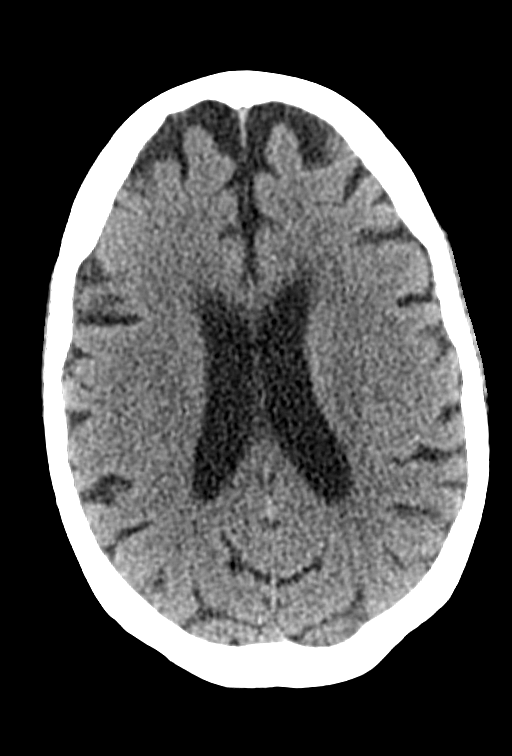
[im 32/58  bone]
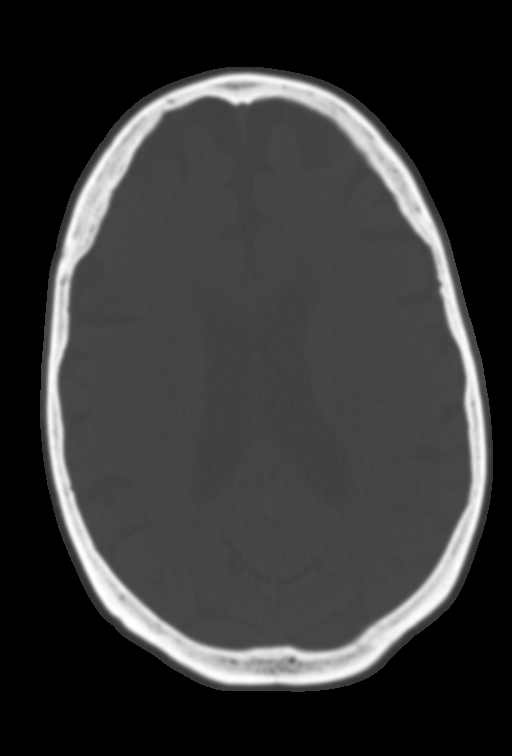
[im 39/58  brain]
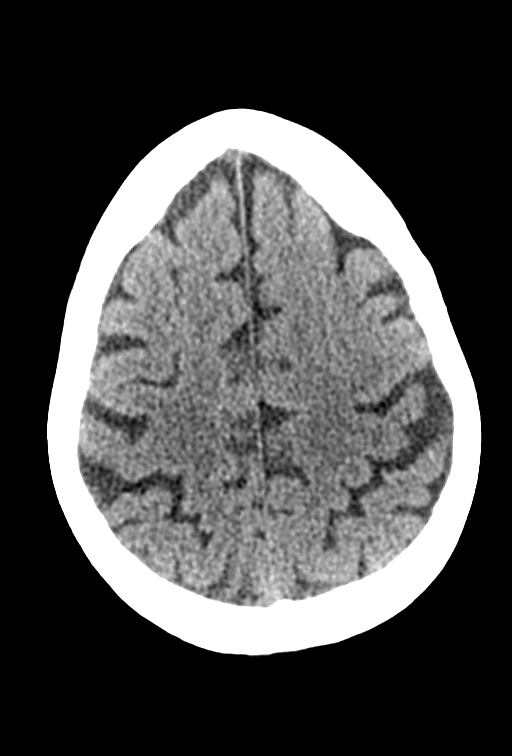
[im 45/58  brain]
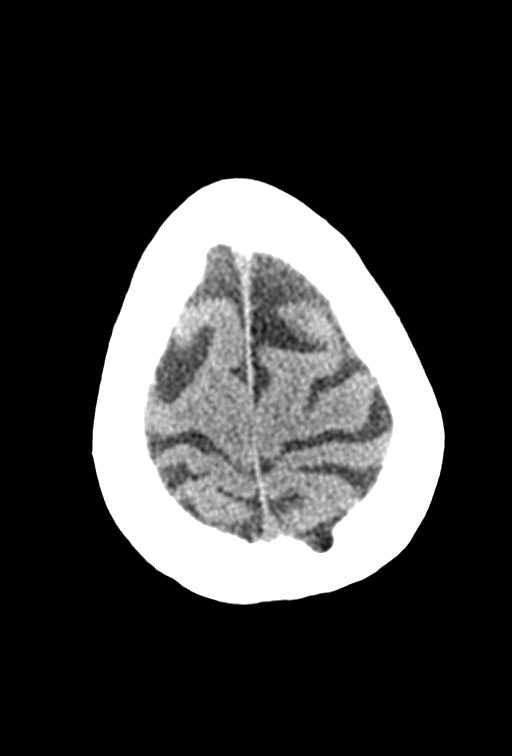
[im 51/58  brain]
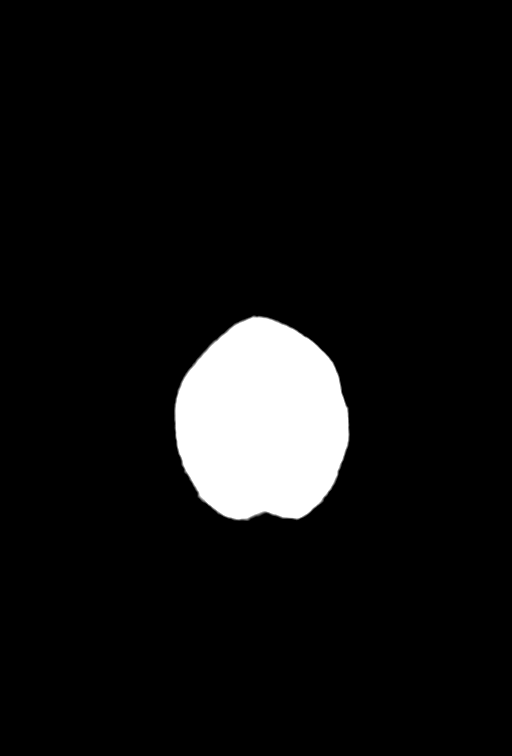

[11 of 47 positions shown; findings below may reference images not displayed]

FINDINGS: Brain: Mild cerebral atrophy. Patchy and confluent areas of
decreased attenuation are noted throughout the deep and
periventricular white matter of the cerebral hemispheres
bilaterally, compatible with chronic microvascular ischemic disease.
No evidence of acute infarction, hemorrhage, hydrocephalus,
extra-axial collection or mass lesion/mass effect.

Vascular: No hyperdense vessel or unexpected calcification.

Skull: Normal. Negative for fracture or focal lesion.

Sinuses/Orbits: No acute finding.

Other: None.
IMPRESSION: 1. No acute intracranial abnormalities.
2. Mild cerebral atrophy with chronic microvascular ischemic changes
in the cerebral white matter, as above.

## 2021-08-17 IMAGING — DX DG CHEST 1V PORT
1 series · 1 of 1 positions shown · non-contrast
Comparison: Portable chest [DATE]

CLINICAL DATA: Dehydration, abdominal pain and constipation.

EXAM:
PORTABLE CHEST 1 VIEW

[chest ap]
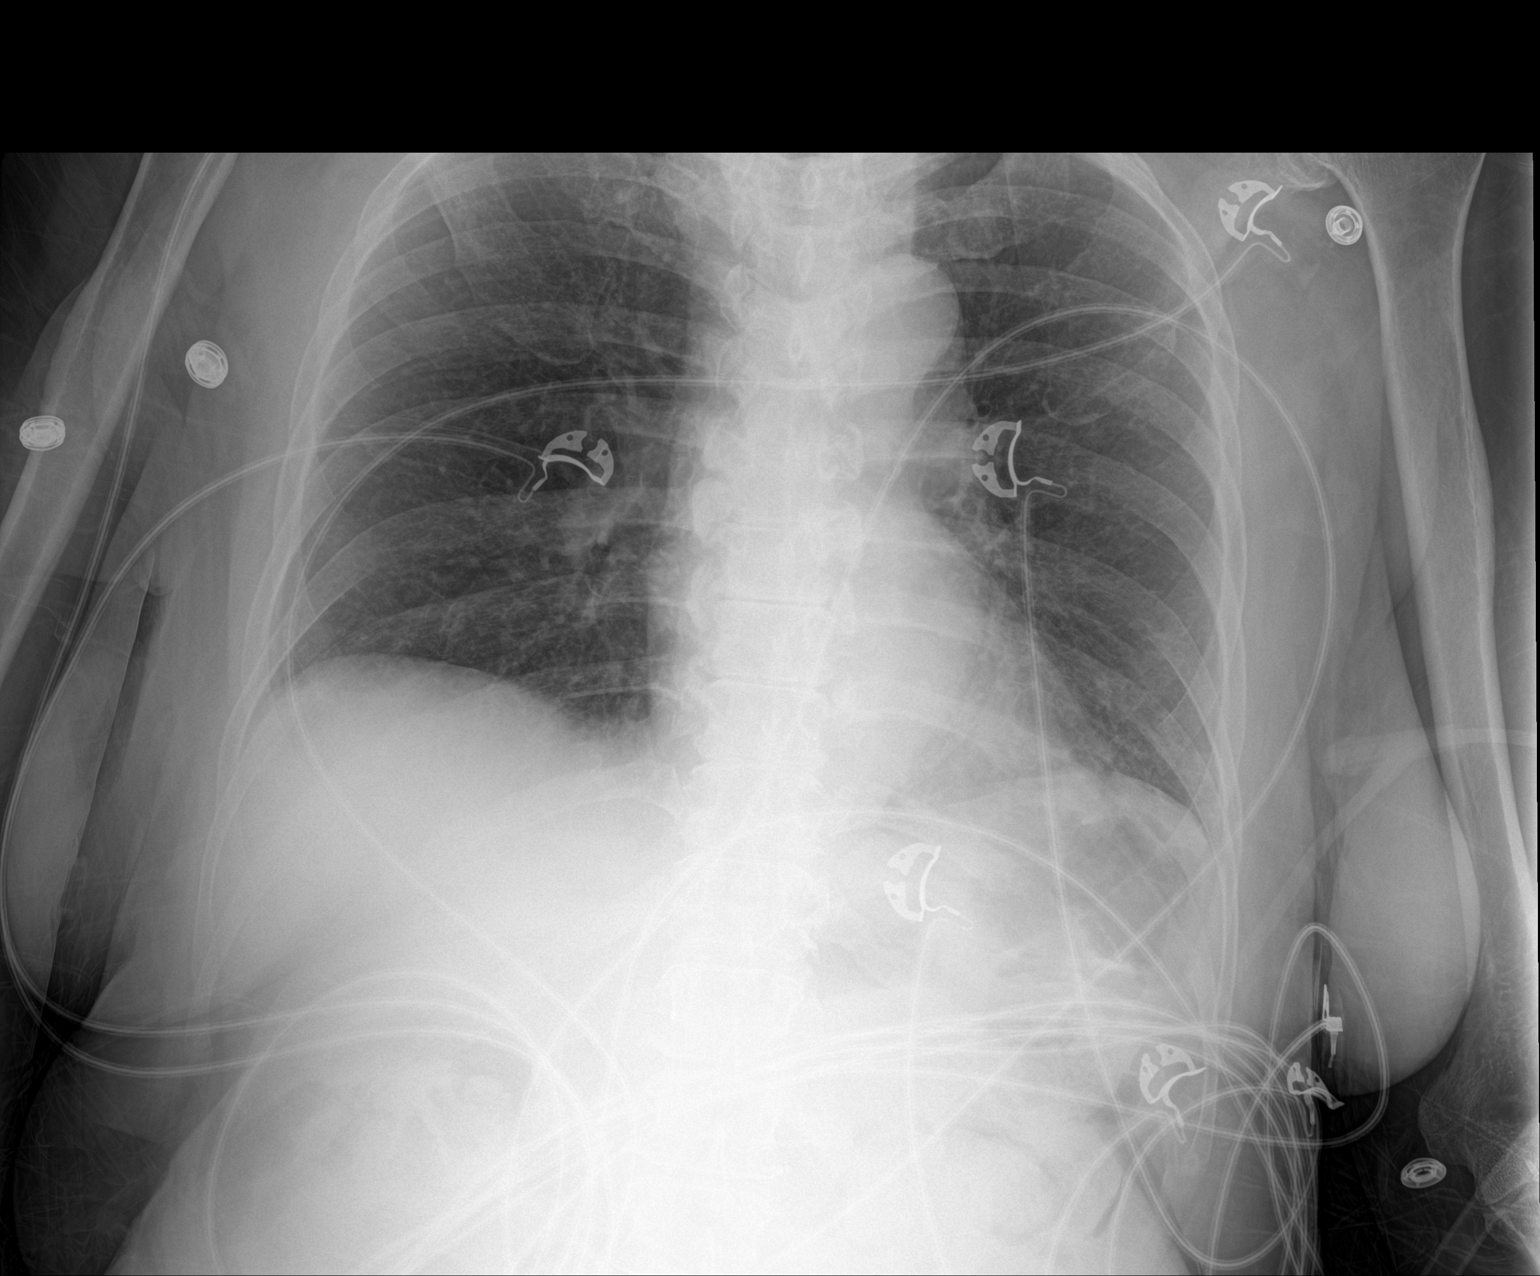

[1 of 1 positions shown; findings below may reference images not displayed]

FINDINGS: The heart size and mediastinal contours are within normal limits
aside from stable aortic tortuosity. Both lungs are clear. The
visualized skeletal structures are intact with moderate thoracic
marginal osteophytosis.
IMPRESSION: No evidence of acute chest disease or interval changes.

## 2021-08-17 IMAGING — CT CT ABD-PELV W/ CM
2 of 5 series · 15 of 46 positions shown, 17 images · IV contrast (agent unspecified)
Comparison: CT of the abdomen and pelvis [DATE].

CLINICAL DATA: 60-year-old female with history of acute onset of
nonlocalized abdominal pain.

EXAM:
CT ABDOMEN AND PELVIS WITH CONTRAST
TECHNIQUE: Multidetector CT imaging of the abdomen and pelvis was performed
using the standard protocol following bolus administration of
intravenous contrast.

[Series 2: axial st · axial · 0.72mm/px · z∈[-424,-54]mm · 12 of 86 slices shown, 14 images]
[im 6/86  soft-tissue]
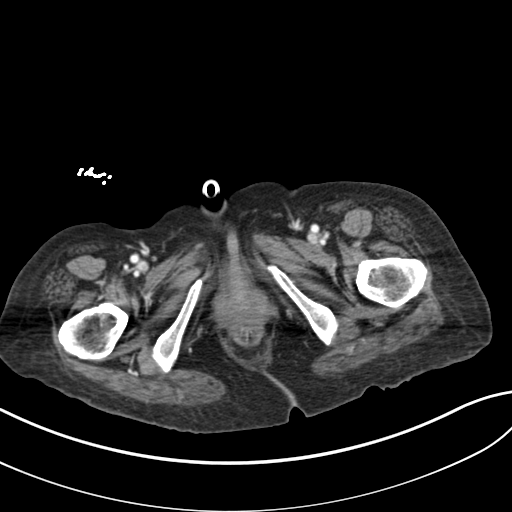
[im 6/86  bone]
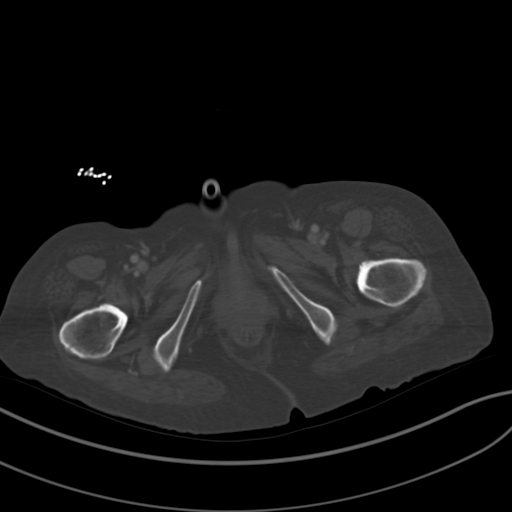
[im 11/86  soft-tissue]
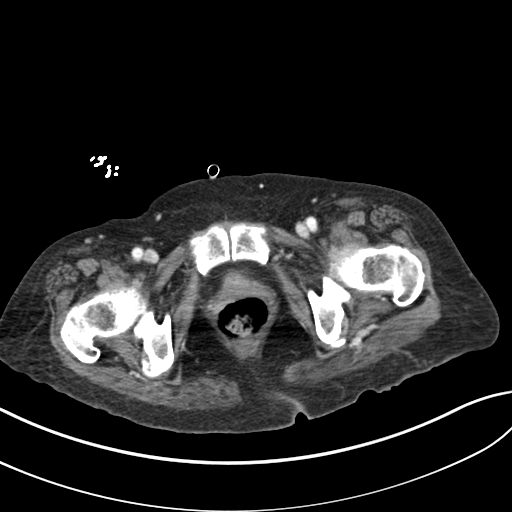
[im 22/86  soft-tissue]
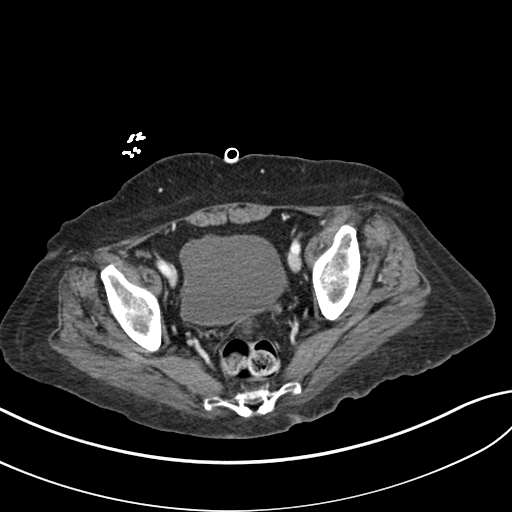
[im 27/86  soft-tissue]
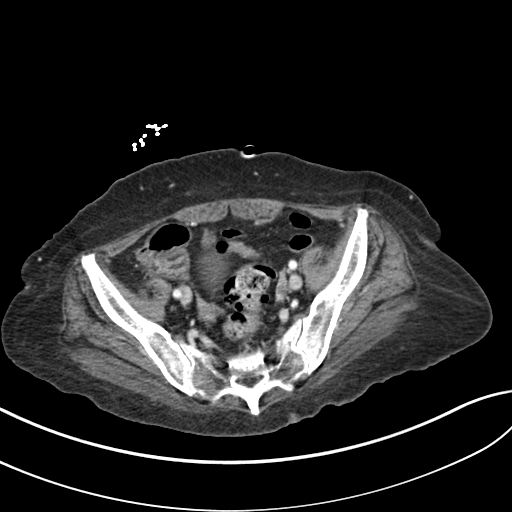
[im 32/86  soft-tissue]
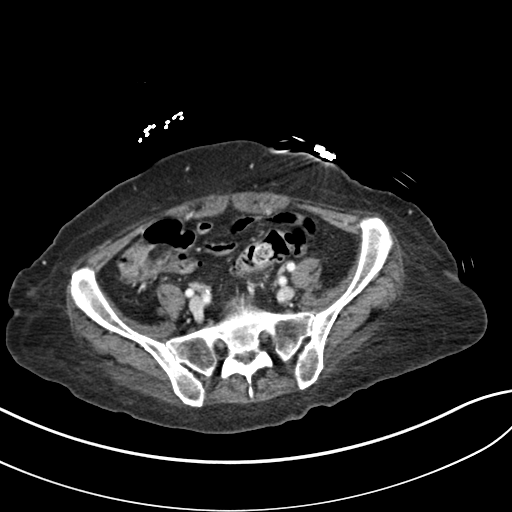
[im 38/86  soft-tissue]
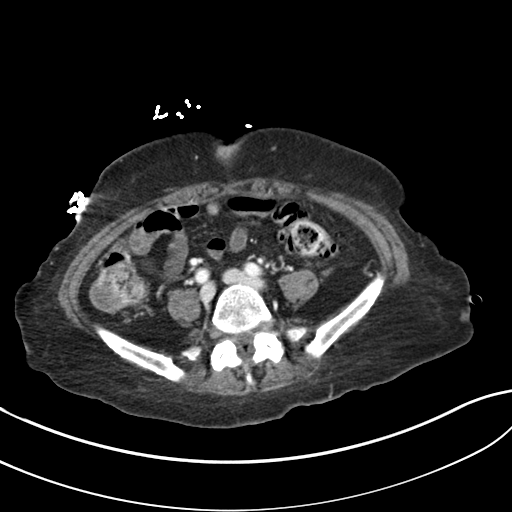
[im 48/86  soft-tissue]
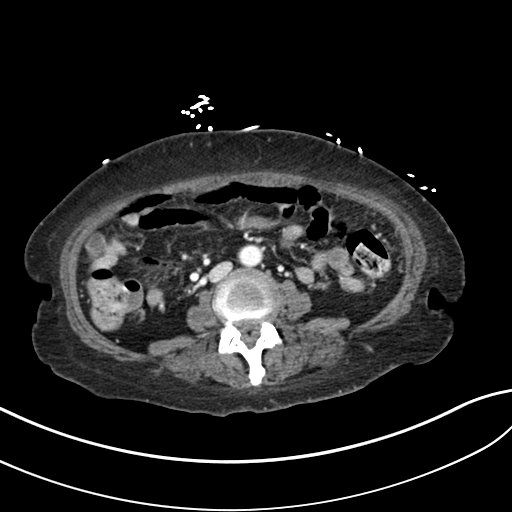
[im 54/86  soft-tissue]
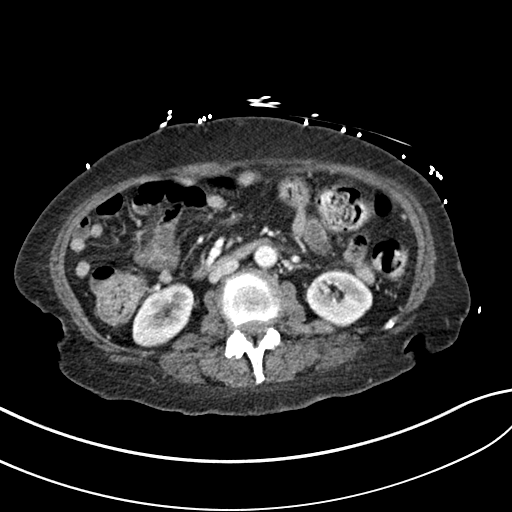
[im 59/86  soft-tissue]
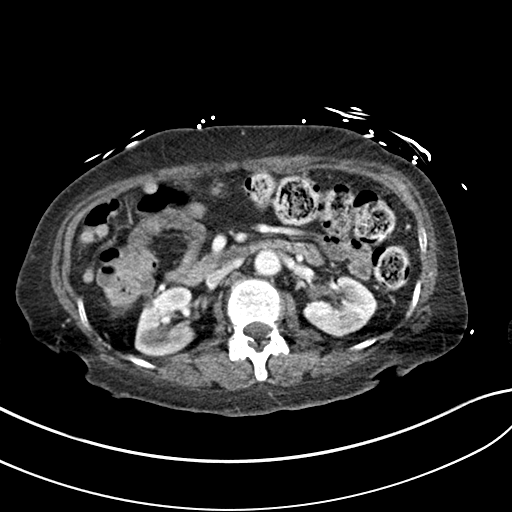
[im 59/86  bone]
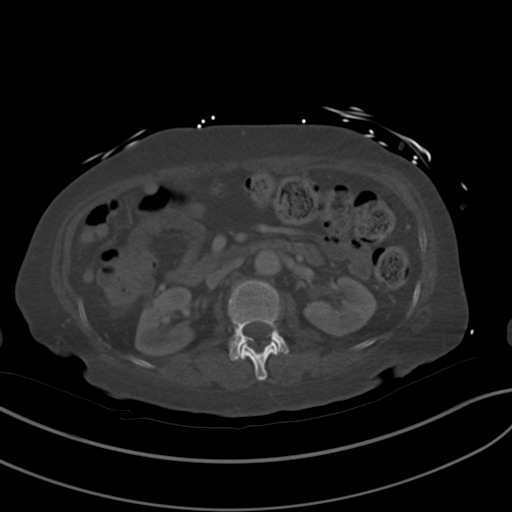
[im 64/86  soft-tissue]
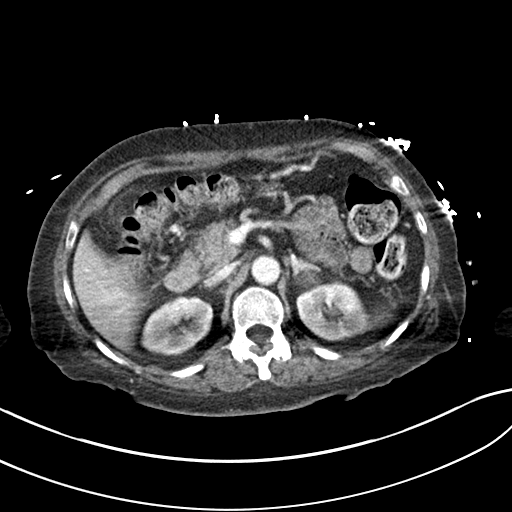
[im 75/86  soft-tissue]
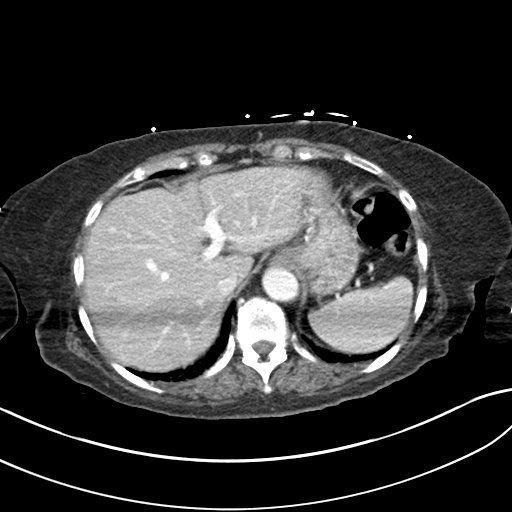
[im 80/86  soft-tissue]
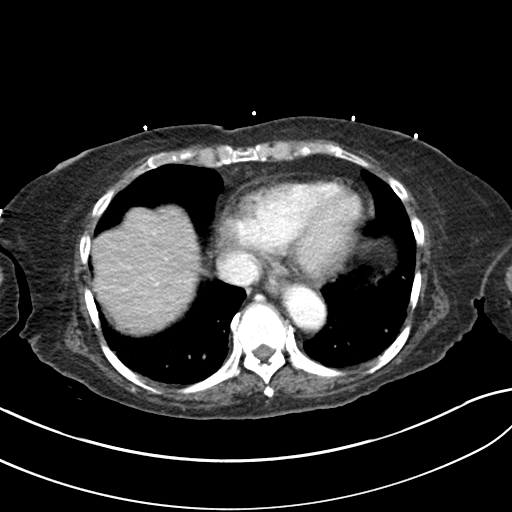

[Series 5: coronal st · coronal · 0.70mm/px · 3 of 122 slices shown]
[im 41/122  soft-tissue]
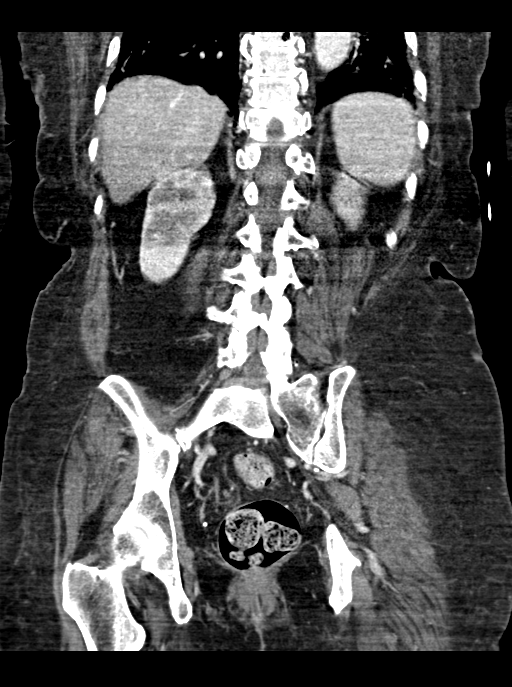
[im 54/122  soft-tissue]
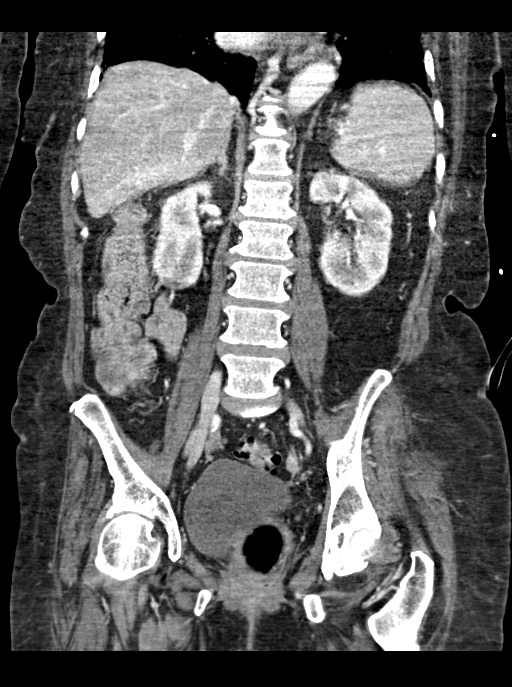
[im 68/122  soft-tissue]
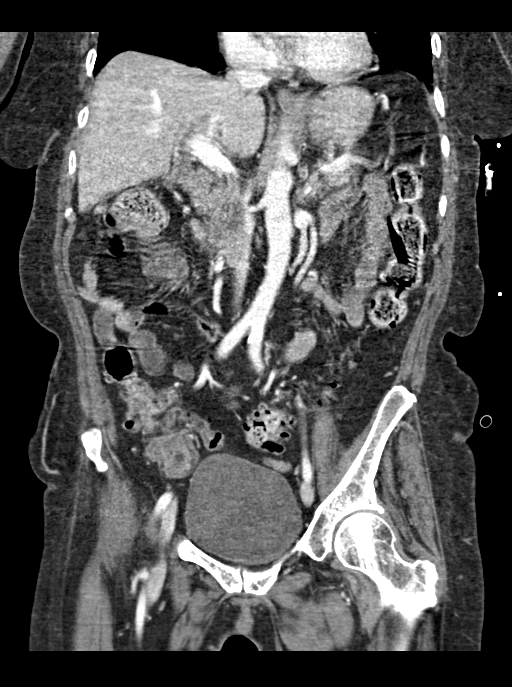

[15 of 46 positions shown; findings below may reference images not displayed]

RADIATION DOSE REDUCTION: This exam was performed according to the
departmental dose-optimization program which includes automated
exposure control, adjustment of the mA and/or kV according to
patient size and/or use of iterative reconstruction technique.

CONTRAST:  80mL OMNIPAQUE IOHEXOL 300 MG/ML  SOLN
FINDINGS: Comment: Portions of today's examination are limited by patient
motion, and from artifact from the patient being imaged with the
arms in the down position.

Lower chest: Unremarkable.

Hepatobiliary: No suspicious cystic or solid hepatic lesions. No
intra or extrahepatic biliary ductal dilatation. Gallbladder is
normal in appearance.

Pancreas: No pancreatic mass. No pancreatic ductal dilatation. No
pancreatic or peripancreatic fluid collections or inflammatory
changes.

Spleen: Unremarkable.

Adrenals/Urinary Tract: No definite suspicious renal lesions. Right
adrenal gland is normal in appearance. Well-defined 2.1 cm left
adrenal lesion, similar to the prior study (indeterminate). No
hydroureteronephrosis. Urinary bladder is normal in appearance.

Stomach/Bowel: The appearance of the stomach is normal. There is no
pathologic dilatation of small bowel or colon. A few scattered
colonic diverticulae are noted, without surrounding inflammatory
changes to suggest an acute diverticulitis at this time. Normal
appendix.

Vascular/Lymphatic: Atherosclerotic calcifications are noted in the
pelvic vasculature. No aneurysm or dissection noted in the abdominal
or pelvic vasculature. No lymphadenopathy noted in the abdomen or
pelvis.

Reproductive: Status post hysterectomy.  Ovaries are atrophic.

Other: No significant volume of ascites.  No pneumoperitoneum.

Musculoskeletal: There are no aggressive appearing lytic or blastic
lesions noted in the visualized portions of the skeleton.
IMPRESSION: 1. No acute findings are noted in the abdomen or pelvis to account
for the patient's symptoms.
2. Colonic diverticulosis without evidence of acute diverticulitis
at this time.
3. 2.1 cm left adrenal nodule, stable compared to the prior study,
but indeterminate. This may represent a benign adenoma. Follow-up
nonemergent adrenal protocol CT scan is recommended in the near
future to provide definitive characterization. This recommendation
follows ACR consensus guidelines: White Paper of the ACR Incidental
4. Atherosclerosis.

## 2021-08-17 MED ORDER — PREDNISONE 20 MG PO TABS
40.0000 mg | ORAL_TABLET | Freq: Every day | ORAL | Status: DC
  Administered 2021-08-18 – 2021-08-19 (×2): 40 mg via ORAL
  Filled 2021-08-17 (×2): qty 2

## 2021-08-17 MED ORDER — SENNOSIDES-DOCUSATE SODIUM 8.6-50 MG PO TABS
1.0000 | ORAL_TABLET | Freq: Two times a day (BID) | ORAL | Status: DC
  Administered 2021-08-18 – 2021-08-23 (×4): 1 via ORAL
  Filled 2021-08-17 (×8): qty 1

## 2021-08-17 MED ORDER — ARIPIPRAZOLE 5 MG PO TABS
5.0000 mg | ORAL_TABLET | Freq: Every day | ORAL | Status: DC
  Administered 2021-08-17 – 2021-08-20 (×4): 5 mg via ORAL
  Filled 2021-08-17 (×8): qty 1

## 2021-08-17 MED ORDER — IOHEXOL 300 MG/ML  SOLN
80.0000 mL | Freq: Once | INTRAMUSCULAR | Status: AC | PRN
  Administered 2021-08-17: 80 mL via INTRAVENOUS

## 2021-08-17 MED ORDER — ACETAMINOPHEN 325 MG PO TABS
650.0000 mg | ORAL_TABLET | Freq: Four times a day (QID) | ORAL | Status: DC | PRN
  Administered 2021-08-17 – 2021-08-19 (×7): 650 mg via ORAL
  Administered 2021-08-21: 325 mg via ORAL
  Filled 2021-08-17 (×11): qty 2

## 2021-08-17 MED ORDER — DEXAMETHASONE SODIUM PHOSPHATE 10 MG/ML IJ SOLN
6.0000 mg | Freq: Once | INTRAMUSCULAR | Status: AC
  Administered 2021-08-17: 6 mg via INTRAVENOUS
  Filled 2021-08-17: qty 1

## 2021-08-17 MED ORDER — LACTATED RINGERS IV BOLUS
1000.0000 mL | Freq: Once | INTRAVENOUS | Status: AC
Start: 2021-08-17 — End: 2021-08-17
  Administered 2021-08-17: 1000 mL via INTRAVENOUS

## 2021-08-17 MED ORDER — PROCHLORPERAZINE EDISYLATE 10 MG/2ML IJ SOLN
5.0000 mg | INTRAMUSCULAR | Status: DC | PRN
  Administered 2021-08-18 – 2021-08-21 (×2): 5 mg via INTRAVENOUS
  Filled 2021-08-17 (×2): qty 2

## 2021-08-17 MED ORDER — LORATADINE 10 MG PO TABS
10.0000 mg | ORAL_TABLET | Freq: Every day | ORAL | Status: DC | PRN
  Administered 2021-08-18 – 2021-08-19 (×2): 10 mg via ORAL
  Filled 2021-08-17 (×2): qty 1

## 2021-08-17 MED ORDER — FOLIC ACID 1 MG PO TABS
1.0000 mg | ORAL_TABLET | Freq: Every day | ORAL | Status: DC
  Administered 2021-08-17 – 2021-08-19 (×3): 1 mg via ORAL
  Filled 2021-08-17 (×4): qty 1

## 2021-08-17 MED ORDER — PANTOPRAZOLE SODIUM 40 MG PO TBEC
40.0000 mg | DELAYED_RELEASE_TABLET | Freq: Every day | ORAL | Status: DC
  Administered 2021-08-17 – 2021-08-19 (×3): 40 mg via ORAL
  Filled 2021-08-17 (×4): qty 1

## 2021-08-17 MED ORDER — LACTATED RINGERS IV SOLN: INTRAVENOUS | Status: DC

## 2021-08-17 MED ORDER — SODIUM CHLORIDE 0.9 % IV SOLN
2.0000 g | Freq: Once | INTRAVENOUS | Status: AC
  Administered 2021-08-17: 2 g via INTRAVENOUS
  Filled 2021-08-17: qty 20

## 2021-08-17 MED ORDER — FENTANYL CITRATE PF 50 MCG/ML IJ SOSY
25.0000 ug | PREFILLED_SYRINGE | Freq: Once | INTRAMUSCULAR | Status: AC
  Administered 2021-08-17: 25 ug via INTRAVENOUS
  Filled 2021-08-17: qty 1

## 2021-08-17 MED ORDER — BUPROPION HCL ER (XL) 300 MG PO TB24
300.0000 mg | ORAL_TABLET | Freq: Every day | ORAL | Status: DC
  Administered 2021-08-17 – 2021-08-19 (×3): 300 mg via ORAL
  Filled 2021-08-17 (×4): qty 1

## 2021-08-17 MED ORDER — ESCITALOPRAM OXALATE 10 MG PO TABS
20.0000 mg | ORAL_TABLET | Freq: Every day | ORAL | Status: DC
  Administered 2021-08-17 – 2021-08-23 (×4): 20 mg via ORAL
  Filled 2021-08-17 (×4): qty 1
  Filled 2021-08-17: qty 2
  Filled 2021-08-17: qty 1

## 2021-08-17 MED ORDER — LIP MEDEX EX OINT
TOPICAL_OINTMENT | CUTANEOUS | Status: DC | PRN
  Filled 2021-08-17: qty 7

## 2021-08-17 MED ORDER — SODIUM CHLORIDE 0.9 % IV BOLUS
1000.0000 mL | Freq: Once | INTRAVENOUS | Status: AC
  Administered 2021-08-17: 1000 mL via INTRAVENOUS

## 2021-08-17 MED ORDER — ACETAMINOPHEN 650 MG RE SUPP
650.0000 mg | Freq: Four times a day (QID) | RECTAL | Status: DC | PRN
  Administered 2021-08-22: 650 mg via RECTAL
  Filled 2021-08-17: qty 1

## 2021-08-17 MED ORDER — SODIUM CHLORIDE 0.9 % IV BOLUS
500.0000 mL | Freq: Once | INTRAVENOUS | Status: AC
  Administered 2021-08-17: 500 mL via INTRAVENOUS

## 2021-08-17 MED ORDER — VITAMIN B-12 1000 MCG PO TABS
1000.0000 ug | ORAL_TABLET | Freq: Every day | ORAL | Status: DC
  Administered 2021-08-17 – 2021-08-19 (×3): 1000 ug via ORAL
  Filled 2021-08-17 (×3): qty 1

## 2021-08-17 NOTE — Progress Notes (Signed)
Received report from previous RN. Agree with RN assessment. Pt resting. Will continue to monitor.  ?

## 2021-08-17 NOTE — ED Provider Notes (Signed)
?[facility] DE[facility] ?[facility] Emergency Department ?Provider Note ?MRN:  [id number]  ?Arrival date & time: 2021-06-16    ? ?Chief Complaint   ?Abdominal Pain ?  ?History of Present Illness   ?Carrie [name] is a 61 y.o. year-old female with a history of hypertension presenting to the ED with chief complaint of abdominal pain. ? ?Patient endorsing abdominal pain.  Repeatedly asking for help.  Seems to have altered mental status as well. ? ?Review of Systems  ?I was unable to obtain a full/accurate HPI, PMH, or ROS due to the patient's altered mental status. ? ?Patient's Health History   ? ?Past Medical History:  ?Diagnosis Date  ? Hyperlipidemia   ? Hypertension   ?  ?Past Surgical History:  ?Procedure Laterality Date  ? ABDOMINAL HYSTERECTOMY    ? REDUCTION MAMMAPLASTY    ?  ?Family History  ?Problem Relation Age of Onset  ? Breast cancer Mother   ?     deceased by [id number]  ? Breast cancer Sister [id number]  ? Diabetes Sister   ? Breast cancer Other   ? Coronary artery disease Other   ? Hypertension Other   ?  ?Social History  ? ?Socioeconomic History  ? Marital status: Single  ?  Spouse name: Not on file  ? Number of children: Not on file  ? Years of education: Not on file  ? Highest education level: Not on file  ?Occupational History  ? Not on file  ?Tobacco Use  ? Smoking status: Former  ?  Types: Cigarettes  ?  Quit date: 02/05/2014  ?  Years since quitting: 7.5  ? Smokeless tobacco: Never  ? Tobacco comments:  ?  1 cig every 4-5 days ---weaning off  ?Vaping Use  ? Vaping Use: Unknown  ?Substance and Sexual Activity  ? Alcohol use: Not Currently  ? Drug use: No  ? Sexual activity: Not Currently  ?  Partners: Male  ?Other Topics Concern  ? Not on file  ?Social History Narrative  ? Exercise --rare  ? ?Social Determinants of Health  ? ?Financial Resource Strain: Not on file  ?Food Insecurity: Not on file  ?Transportation Needs: Not on file  ?Physical Activity: Not on file  ?Stress: Not on file  ?Social Connections: Not on  file  ?Intimate Partner Violence: Not on file  ?  ? ?Physical Exam  ? ?Vitals:  ? 2021-06-16 0630 2021-06-16 0714  ?BP: 105/75 105/70  ?Pulse: 95 95  ?Resp: (!) 28 (!) 24  ?Temp:    ?SpO2: 96% 96%  ?  ?CONSTITUTIONAL: Chronically ill-appearing, NAD ?NEURO/PSYCH: Oriented to name only, moving all extremities ?EYES:  eyes equal and reactive ?ENT/NECK:  no LAD, no JVD ?CARDIO: Tachycardic rate, well-perfused, normal S1 and S2 ?PULM:  CTAB no wheezing or rhonchi ?GI/GU:  non-distended, non-tender ?MSK/SPINE:  No gross deformities, no edema ?SKIN:  no rash, atraumatic ? ? ?*Additional and/or pertinent findings included in MDM below ? ?Diagnostic and Interventional Summary  ? ? EKG Interpretation ? ?Date/Time:  Sunday August 17 2021 04:30:03 EDT ?Ventricular Rate:  103 ?PR Interval:  141 ?QRS Duration: 111 ?QT Interval:  376 ?QTC Calculation: 493 ?R Axis:   3 ?Text Interpretation: Sinus tachycardia Ventricular premature complex Aberrant conduction of SV complex(es) Low voltage, extremity and precordial leads Nonspecific T abnormalities, lateral leads Borderline prolonged QT interval Confirmed by Kennis CarinaBero, Karlea Mckibbin [phone]([id number]) on 2021/12/27 5:49:43 AM ?  ? ?  ? ?Labs Reviewed  ?CBC - Abnormal; Notable for the following  components:  ?    Result Value  ? WBC 3.7 (*)   ? RBC 3.54 (*)   ? Hemoglobin 10.7 (*)   ? HCT 31.5 (*)   ? RDW 15.9 (*)   ? Platelets 91 (*)   ? All other components within normal limits  ?COMPREHENSIVE METABOLIC PANEL - Abnormal; Notable for the following components:  ? Sodium 134 (*)   ? CO2 17 (*)   ? Glucose, Bld 113 (*)   ? BUN 36 (*)   ? Creatinine, Ser 1.37 (*)   ? Total Protein 5.0 (*)   ? Albumin 2.7 (*)   ? AST 44 (*)   ? GFR, Estimated 44 (*)   ? All other components within normal limits  ?LACTIC ACID, PLASMA - Abnormal; Notable for the following components:  ? Lactic Acid, Venous 3.1 (*)   ? All other components within normal limits  ?CULTURE, BLOOD (SINGLE)  ?RESP PANEL BY RT-PCR (FLU A&B, COVID) ARPGX2   ?CULTURE, BLOOD (SINGLE)  ?LIPASE, BLOOD  ?MAGNESIUM  ?PROTIME-INR  ?URINALYSIS, ROUTINE W REFLEX MICROSCOPIC  ?RETICULOCYTES  ?ADAMTS13 ACTIVITY  ?TROPONIN I (HIGH SENSITIVITY)  ?TROPONIN I (HIGH SENSITIVITY)  ?  ?CT HEAD WO CONTRAST ( )  ?Final Result  ?  ?CT ABDOMEN PELVIS W CONTRAST  ?Final Result  ?  ?DG Chest Port 1 View  ?Final Result  ?  ?  ?Medications  ?cefTRIAXone (ROCEPHIN) 2 g in sodium chloride 0.9 % 100 mL IVPB (2 g Intravenous New Bag/Given 08/31/2021 0707)  ?sodium chloride 0.9 % bolus 500 mL (0 mLs Intravenous Stopped 08/19/2021 0710)  ?fentaNYL (SUBLIMAZE) injection 25 mcg (25 mcg Intravenous Given 09/04/2021 0518)  ?iohexol (OMNIPAQUE) 300 MG/ML solution 80 mL (80 mLs Intravenous Contrast Given 08/16/2021 0621)  ?sodium chloride 0.9 % bolus 1,000 mL (1,000 mLs Intravenous New Bag/Given 08/06/2021 0708)  ?  ? ?Procedures  /  Critical Care ?Procedures ? ?ED Course and Medical Decision Making  ?Initial Impression and Ddx ?Patient complaining of abdominal pain, does not seem to be able to answer my questions appropriately, does not know what year it is.  Recently seen in the emergency department for biliary colic, constipation.  Differential diagnosis includes sepsis, cholecystitis, UTI, intracranial mass or lesion, awaiting labs, CT imaging. ? ?Past medical/surgical history that increases complexity of ED encounter: None ? ?Interpretation of Diagnostics ?I personally reviewed the EKG and my interpretation is as follows: Sinus tachycardia ?   ?Labs revealing a lactic acidosis, mild AKI ? ?Patient Reassessment and Ultimate Disposition/Management ?UA is pending, anticipating admission for UTI versus dehydration with altered mental status.  Signed out to oncoming provider. ? ?Patient management required discussion with the following services or consulting groups:  None ? ?Complexity of Problems Addressed ?Acute illness or injury that poses threat of life of bodily function ? ?Additional Data Reviewed and  Analyzed ?Further history obtained from: ?EMS on arrival ? ?Additional Factors Impacting ED Encounter Risk ?Consideration of hospitalization ? ?Elmer Sow. Pilar Plate, MD ?[facility] Emergency Medicine ?[facility] ?[email or web address] ? ?Final Clinical Impressions(s) / ED Diagnoses  ? ?  ICD-10-CM   ?1. Abdominal pain, unspecified abdominal location  R10.9   ?  ?2. Altered mental status, unspecified altered mental status type  R41.82   ?  ?  ?ED Discharge Orders   ? ? None  ? ?  ?  ? ?Discharge Instructions Discussed with and Provided to Patient:  ? ?Discharge Instructions   ?None ?  ? ?  ?Ghazal Pevey,  Elmer Sow, MD ?08/14/2021 [phone] ? ?

## 2021-08-17 NOTE — Plan of Care (Signed)
  Problem: Education: Goal: Knowledge of General Education information will improve Description: Including pain rating scale, medication(s)/side effects and non-pharmacologic comfort measures Outcome: Progressing   Problem: Activity: Goal: Risk for activity intolerance will decrease Outcome: Progressing   Problem: Nutrition: Goal: Adequate nutrition will be maintained Outcome: Progressing   Problem: Coping: Goal: Level of anxiety will decrease Outcome: Progressing   

## 2021-08-17 NOTE — ED Triage Notes (Signed)
Pt. BIB [facility] C/O lower abdominal pain and constipation for a few days per EMS. Pt. Was seen last week for dehydration. Pt. Does not c/o NVD, but complains of back pain. Pt was given NS in route ? ?EMS VS: ?104/60  ?

## 2021-08-17 NOTE — Evaluation (Signed)
Clinical/Bedside Swallow Evaluation ?Patient Details  ?Name: Carrie [name] ?MRN: [id number] ?Date of Birth: 07/31/1960 ? ?Today's Date: 08/20/2021 ?Time: SLP Start Time (ACUTE ONLY): 1745 SLP Stop Time (ACUTE ONLY): 1805 ?SLP Time Calculation (min) (ACUTE ONLY): 20 min ? ?Past Medical History:  ?Past Medical History:  ?Diagnosis Date  ? Depression with anxiety 05/26/2021  ? Dermatomyositis (HCC) 06/23/2021  ? Fatty liver 06/23/2021  ? Hyperlipidemia   ? Hypertension   ? Non-traumatic rhabdomyolysis   ? Pre-syncope 03/26/2021  ? Rheumatoid arthritis flare (HCC) 04/08/2021  ? ?Past Surgical History:  ?Past Surgical History:  ?Procedure Laterality Date  ? ABDOMINAL HYSTERECTOMY    ? REDUCTION MAMMAPLASTY    ? ?HPI:  ?Patient is a 61 y.o. female with PMH: depression, anxiety, HLD, HTN, nontraumatic rhabdomyolysis, presyncope, history of rheumatoid arthritis who is brought to the emergency department for the third time in the past 12 days due to altered mental status, abdominal pain, constipation for several days, decreased oral intake with dehydration. [facility] after fall and LOC.  She unfortunately had cognitive decline and at time of discharge home was requiring mod-maxA. During this current admission, CT abdomen/pelvis did not show any acute findings to explain patient's symptoms. CT Head was negative for acute intracranial abnormality. CXR negative for acute chest disease or interval changes and both lungs were clear.  ?  ?Assessment / Plan / Recommendation  ?Clinical Impression ? Patient presents with clinical s/s of dysphagia as per this clinical/bedside swallow evaluation. SLP suspects some component of esophageal dysphagia as evidenced by patient having delayed throat clearing, some slight grimacing during swallow and occasional hiccups. Patient did not exhibit any overt s/s aspiration or penetration with thin liquids via straw sips and bites of gelatin. SLP  suspects mildly delayed swallow initiation. SLP is recommending to continue on current diet (regular solids, thin liquids) at this time and plan for f/u next date to determine if need for objective swallow study. ?SLP Visit Diagnosis: Dysphagia, unspecified (R13.10) ?   ?Aspiration Risk ? Mild aspiration risk  ?  ?Diet Recommendation Regular;Thin liquid  ? ?Liquid Administration via: Cup;Straw ?Medication Administration: Whole meds with liquid ?Supervision: Full supervision/cueing for compensatory strategies;Staff to assist with self feeding ?Compensations: Slow rate;Small sips/bites ?Postural Changes: Seated upright at 90 degrees;Remain upright for at least 30 minutes after po intake  ?  ?Other  Recommendations Oral Care Recommendations: Oral care BID;Staff/trained caregiver to provide oral care   ? ?Recommendations for follow up therapy are one component of a multi-disciplinary discharge planning process, led by the attending physician.  Recommendations may be updated based on patient status, additional functional criteria and insurance authorization. ? ?Follow up Recommendations Home health SLP  ? ? ?  ?Assistance Recommended at Discharge Frequent or constant Supervision/Assistance  ?Functional Status Assessment Patient has had a recent decline in their functional status and demonstrates the ability to make significant improvements in function in a reasonable and predictable amount of time.  ?Frequency and Duration min 1 x/week  ?1 week ?  ?   ? ?Prognosis Prognosis for Safe Diet Advancement: Good  ? ?  ? ?Swallow Study   ?General Date of Onset: 08/12/2021 ?HPI: Patient is a 61 y.o. female with PMH: depression, anxiety, HLD, HTN, nontraumatic rhabdomyolysis, presyncope, history of rheumatoid arthritis who is brought to the emergency department for the third time in the past 12 days due to altered mental status, abdominal pain, constipation for  several days, decreased oral intake with dehydration. She was in the  inpatient rehab unit of [facility] back in November of 2022 after fall and LOC.  She unfortunately had cognitive decline and at time of discharge home was requiring mod-maxA. During this current admission, CT abdomen/pelvis did not show any acute findings to explain patient's symptoms. CT Head was negative for acute intracranial abnormality. CXR negative for acute chest disease or interval changes and both lungs were clear. ?Type of Study: Bedside Swallow Evaluation ?Previous Swallow Assessment: none found ?Diet Prior to this Study: Regular;Thin liquids ?Temperature Spikes Noted: No ?Respiratory Status: Room air ?History of Recent Intubation: No ?Behavior/Cognition: Alert;Cooperative;Pleasant mood;Confused ?Oral Cavity Assessment: Within Functional Limits ?Oral Care Completed by SLP: No ?Oral Cavity - Dentition: Adequate natural dentition ?Vision: Functional for self-feeding ?Self-Feeding Abilities: Total assist ?Patient Positioning: Upright in bed ?Baseline Vocal Quality: Normal ?Volitional Cough: Weak ?Volitional Swallow: Able to elicit  ?  ?Oral/Motor/Sensory Function Overall Oral Motor/Sensory Function: Generalized oral weakness   ?Ice Chips     ?Thin Liquid Thin Liquid: Impaired ?Presentation: Straw ?Pharyngeal  Phase Impairments: Suspected delayed Swallow;Throat Clearing - Delayed  ?  ?Nectar Thick     ?Honey Thick     ?Puree     ?Solid ? ? ?  Solid: Impaired ?Pharyngeal Phase Impairments: Other (comments);Suspected delayed Swallow  ? ?  ? ?Angela Nevin, MA, CCC-SLP ?Speech Therapy ? ? ? ? ?

## 2021-08-17 NOTE — Consult Note (Signed)
[place]  [phone]   HEMATOLOGY ONCOLOGY INPATIENT CONSULTATION   TORRI CARNLEY  DOB: 12/25/1960  MR#: [id number]  CSN#: [id number]    Requesting Physician: [facility]  Patient Care Team: Carollee Herter, Alferd Apa, DO as PCP - General Servando Salina, MD as Consulting Physician (Obstetrics and Gynecology)  Reason for consult: pancytopenia   History of present illness:   61 yo female, with past medical history of rheumatoid arthritis Hydrochloroquine, mycophenolate and prednisone, anxiety and depression, dermatomyositis, fatty liver, hypertension, presented with altered mental status.  Patient has been seen in the ED 3 times in the past 2 weeks for similar presentation, and she was admitted today for further management.  Patient was awake but only oriented to place, no family at bed side, history most from chart.  She had a normal CBC in November 2022, and was noticed to have mild anemia with hemoglobin 10-11 since then, platelet dropped to 144 7 days ago, and 91 today,no recent bleeding. Of note, pt has been on prednisone 40mg  daily but was recently decreased and she had a similar presentation when she was on tapering dose of prednisone.  MEDICAL HISTORY:  Past Medical History:  Diagnosis Date   Depression with anxiety 05/26/2021   Dermatomyositis ([place]) 06/23/2021   Fatty liver 06/23/2021   Hyperlipidemia    Hypertension    Non-traumatic rhabdomyolysis    Pre-syncope 03/26/2021   Rheumatoid arthritis flare ([place]) 04/08/2021    SURGICAL HISTORY: Past Surgical History:  Procedure Laterality Date   ABDOMINAL HYSTERECTOMY     REDUCTION MAMMAPLASTY      SOCIAL HISTORY: Social History   Socioeconomic History   Marital status: Single    Spouse name: Not on file   Number of children: Not on file   Years of education: Not on file   Highest education level: Not on file  Occupational History   Not on file  Tobacco Use   Smoking status:  Former    Types: Cigarettes    Quit date: 02/05/2014    Years since quitting: 7.5   Smokeless tobacco: Never   Tobacco comments:    1 cig every 4-5 days ---weaning off  Vaping Use   Vaping Use: Unknown  Substance and Sexual Activity   Alcohol use: Not Currently   Drug use: No   Sexual activity: Not Currently    Partners: Male  Other Topics Concern   Not on file  Social History Narrative   Exercise --rare   Social Determinants of Health   Financial Resource Strain: Not on file  Food Insecurity: Not on file  Transportation Needs: Not on file  Physical Activity: Not on file  Stress: Not on file  Social Connections: Not on file  Intimate Partner Violence: Not on file    FAMILY HISTORY: Family History  Problem Relation Age of Onset   Breast cancer Mother        deceased by 56   Breast cancer Sister 59   Diabetes Sister    Breast cancer Other    Coronary artery disease Other    Hypertension Other     ALLERGIES:  is allergic to zoloft [sertraline hcl].  MEDICATIONS:  Current Facility-Administered Medications  Medication Dose Route Frequency Provider Last Rate Last Admin   acetaminophen (TYLENOL) tablet 650 mg  650 mg Oral Q6H PRN Reubin Milan, MD   650 mg at 08/30/2021 2026   Or   acetaminophen (TYLENOL) suppository 650 mg  650 mg Rectal  Q6H PRN Reubin Milan, MD       ARIPiprazole (ABILIFY) tablet 5 mg  5 mg Oral QHS Reubin Milan, MD   5 mg at 09/02/2021 2026   buPROPion (WELLBUTRIN XL) 24 hr tablet 300 mg  300 mg Oral Daily Reubin Milan, MD   300 mg at 08/22/2021 1213   escitalopram (LEXAPRO) tablet 20 mg  20 mg Oral Daily Reubin Milan, MD   20 mg at 0000000 [id number]   folic acid (FOLVITE) tablet 1 mg  1 mg Oral Daily Reubin Milan, MD   1 mg at 08/26/2021 1213   lactated ringers infusion   Intravenous Continuous Reubin Milan, MD 100 mL/hr at 09/05/2021 1527 Restarted at 08/19/2021 1527   lip balm (CARMEX) ointment   Topical PRN Reubin Milan, MD       loratadine (CLARITIN) tablet 10 mg  10 mg Oral Daily PRN Reubin Milan, MD       pantoprazole (PROTONIX) EC tablet 40 mg  40 mg Oral Daily Reubin Milan, MD   40 mg at 09/05/2021 1214   [START ON 08/18/2021] predniSONE (DELTASONE) tablet 40 mg  40 mg Oral Q breakfast Reubin Milan, MD       prochlorperazine (COMPAZINE) injection 5 mg  5 mg Intravenous Q4H PRN Reubin Milan, MD       senna-docusate (Senokot-S) tablet 1 tablet  1 tablet Oral BID Reubin Milan, MD       vitamin B-12 (CYANOCOBALAMIN) tablet 1,000 mcg  1,000 mcg Oral Daily Reubin Milan, MD   1,000 mcg at 09/03/2021 1213    REVIEW OF SYSTEMS:   Constitutional: Denies fevers, chills or abnormal night sweats Eyes: Denies blurriness of vision, double vision or watery eyes Ears, nose, mouth, throat, and face: Denies mucositis or sore throat Respiratory: Denies cough, dyspnea or wheezes Cardiovascular: Denies palpitation, chest discomfort or lower extremity swelling Gastrointestinal:  Denies nausea, heartburn or change in bowel habits Skin: Denies abnormal skin rashes Lymphatics: Denies new lymphadenopathy or easy bruising Neurological:Denies numbness, tingling or new weaknesses Behavioral/Psych: Mood is stable, no new changes  All other systems were reviewed with the patient and are negative.  PHYSICAL EXAMINATION:  Vitals:   08/30/2021 1729 09/02/2021 2059  BP: 107/79 109/75  Pulse: 88 84  Resp: 18 18  Temp: 97.9 F (36.6 C) 97.8 F (36.6 C)  SpO2: 96% 96%   Filed Weights   08/12/2021 0422  Weight: 140 lb (63.5 kg)    GENERAL:alert, no distress and comfortable SKIN: skin color, texture, turgor are normal, no rashes or significant lesions, no ecchymosis or petechia EYES: normal, conjunctiva are pink and non-injected, sclera clear Musculoskeletal:no cyanosis of digits and no clubbing  PSYCH: alert & oriented x 3 with fluent speech NEURO: no focal motor/sensory  deficits  LABORATORY DATA:  I have reviewed the data as listed Lab Results  Component Value Date   WBC 3.7 (L) 08/22/2021   HGB 10.7 (L) 08/23/2021   HCT 31.5 (L) 08/18/2021   MCV 89.0 08/27/2021   PLT 91 (L) 08/31/2021   Recent Labs    04/01/21 0530 04/02/21 0508 04/09/21 0620 08/06/21 1617 08/10/21 1426 08/13/2021 0502  NA  --   --    < > 131* 132* 134*  K  --   --    < > 4.2 3.8 3.7  CL  --   --    < > 102 102 107  CO2  --   --    < >  19* 20* 17*  GLUCOSE  --   --    < > 201* 81 113*  BUN  --   --    < > 22 17 36*  CREATININE  --  0.87   < > 1.38* 1.08* 1.37*  CALCIUM  --   --    < > 10.3 9.3 9.3  GFRNONAA  --  >60   < > 43* 58* 44*  PROT 6.5 6.6   < > 6.7 5.9* 5.0*  ALBUMIN 2.8* 2.8*   < > 3.7 3.2* 2.7*  AST 98* 75*   < > 25 24 44*  ALT 101* 85*   < > 19 15 22   ALKPHOS 60 62   < > 63 54 61  BILITOT 0.7 0.7   < > 1.0 0.9 1.1  BILIDIR 0.2 0.1  --   --   --   --   IBILI 0.5 0.6  --   --   --   --    < > = values in this interval not displayed.    RADIOGRAPHIC STUDIES: I have personally reviewed the radiological images as listed and agreed with the findings in the report. DG Chest 1 View  Result Date: 08/06/2021 CLINICAL DATA:  Dizziness, nausea and vomiting, bilateral leg pain EXAM: CHEST  1 VIEW COMPARISON:  04/16/2021 FINDINGS: Single frontal view of the chest demonstrates an unremarkable cardiac silhouette. No acute airspace disease, effusion, or pneumothorax. No acute bony abnormalities. IMPRESSION: 1. No acute intrathoracic process. Electronically Signed   By: Sharlet Salina M.D.   On: 08/06/2021 19:41   CT HEAD WO CONTRAST ( )  Result Date: 09/02/2021 CLINICAL DATA:  61 year old female with history of delirium. Lower abdominal pain and constipation. EXAM: CT HEAD WITHOUT CONTRAST TECHNIQUE: Contiguous axial images were obtained from the base of the skull through the vertex without intravenous contrast. RADIATION DOSE REDUCTION: This exam was performed according  to the departmental dose-optimization program which includes automated exposure control, adjustment of the mA and/or kV according to patient size and/or use of iterative reconstruction technique. COMPARISON:  Head CT 08/06/2021. FINDINGS: Brain: Mild cerebral atrophy. Patchy and confluent areas of decreased attenuation are noted throughout the deep and periventricular white matter of the cerebral hemispheres bilaterally, compatible with chronic microvascular ischemic disease. No evidence of acute infarction, hemorrhage, hydrocephalus, extra-axial collection or mass lesion/mass effect. Vascular: No hyperdense vessel or unexpected calcification. Skull: Normal. Negative for fracture or focal lesion. Sinuses/Orbits: No acute finding. Other: None. IMPRESSION: 1. No acute intracranial abnormalities. 2. Mild cerebral atrophy with chronic microvascular ischemic changes in the cerebral white matter, as above. Electronically Signed   By: Trudie Reed M.D.   On: 09/01/2021 06:31   CT Head Wo Contrast  Result Date: 08/06/2021 CLINICAL DATA:  Dizziness, nausea and vomiting EXAM: CT HEAD WITHOUT CONTRAST TECHNIQUE: Contiguous axial images were obtained from the base of the skull through the vertex without intravenous contrast. RADIATION DOSE REDUCTION: This exam was performed according to the departmental dose-optimization program which includes automated exposure control, adjustment of the mA and/or kV according to patient size and/or use of iterative reconstruction technique. COMPARISON:  03/26/2021, 04/17/2021 FINDINGS: Brain: No acute infarct or hemorrhage. Lateral ventricles and midline structures are stable. No acute extra-axial fluid collections. No mass effect. Vascular: No hyperdense vessel or unexpected calcification. Skull: Normal. Negative for fracture or focal lesion. Sinuses/Orbits: No acute finding. Other: None. IMPRESSION: 1. No acute intracranial process. Electronically Signed   By: Maxwell Caul.D.  On: 08/06/2021 19:40   CT ABDOMEN PELVIS W CONTRAST  Result Date: 09/02/2021 CLINICAL DATA:  61 year old female with history of acute onset of nonlocalized abdominal pain. EXAM: CT ABDOMEN AND PELVIS WITH CONTRAST TECHNIQUE: Multidetector CT imaging of the abdomen and pelvis was performed using the standard protocol following bolus administration of intravenous contrast. RADIATION DOSE REDUCTION: This exam was performed according to the departmental dose-optimization program which includes automated exposure control, adjustment of the mA and/or kV according to patient size and/or use of iterative reconstruction technique. CONTRAST:  27mL OMNIPAQUE IOHEXOL 300 MG/ML  SOLN COMPARISON:  CT of the abdomen and pelvis 08/10/2021. FINDINGS: Comment: Portions of today's examination are limited by patient motion, and from artifact from the patient being imaged with the arms in the down position. Lower chest: Unremarkable. Hepatobiliary: No suspicious cystic or solid hepatic lesions. No intra or extrahepatic biliary ductal dilatation. Gallbladder is normal in appearance. Pancreas: No pancreatic mass. No pancreatic ductal dilatation. No pancreatic or peripancreatic fluid collections or inflammatory changes. Spleen: Unremarkable. Adrenals/Urinary Tract: No definite suspicious renal lesions. Right adrenal gland is normal in appearance. Well-defined 2.1 cm left adrenal lesion, similar to the prior study (indeterminate). No hydroureteronephrosis. Urinary bladder is normal in appearance. Stomach/Bowel: The appearance of the stomach is normal. There is no pathologic dilatation of small bowel or colon. A few scattered colonic diverticulae are noted, without surrounding inflammatory changes to suggest an acute diverticulitis at this time. Normal appendix. Vascular/Lymphatic: Atherosclerotic calcifications are noted in the pelvic vasculature. No aneurysm or dissection noted in the abdominal or pelvic vasculature. No lymphadenopathy  noted in the abdomen or pelvis. Reproductive: Status post hysterectomy.  Ovaries are atrophic. Other: No significant volume of ascites.  No pneumoperitoneum. Musculoskeletal: There are no aggressive appearing lytic or blastic lesions noted in the visualized portions of the skeleton. IMPRESSION: 1. No acute findings are noted in the abdomen or pelvis to account for the patient's symptoms. 2. Colonic diverticulosis without evidence of acute diverticulitis at this time. 3. 2.1 cm left adrenal nodule, stable compared to the prior study, but indeterminate. This may represent a benign adenoma. Follow-up nonemergent adrenal protocol CT scan is recommended in the near future to provide definitive characterization. This recommendation follows ACR consensus guidelines: White Paper of the ACR Incidental Findings Committee II on Vascular Findings. J Am Coll Radiol 2013; 10:789-794. 4. Atherosclerosis. Electronically Signed   By: Vinnie Langton M.D.   On: 08/15/2021 06:37   CT ABDOMEN PELVIS W CONTRAST  Result Date: 08/10/2021 CLINICAL DATA:  Right lower quadrant abdominal pain. EXAM: CT ABDOMEN AND PELVIS WITH CONTRAST TECHNIQUE: Multidetector CT imaging of the abdomen and pelvis was performed using the standard protocol following bolus administration of intravenous contrast. RADIATION DOSE REDUCTION: This exam was performed according to the departmental dose-optimization program which includes automated exposure control, adjustment of the mA and/or kV according to patient size and/or use of iterative reconstruction technique. CONTRAST:  123mL OMNIPAQUE IOHEXOL 300 MG/ML  SOLN COMPARISON:  None. FINDINGS: Lower chest: No acute abnormality. Hepatobiliary: Gallstones are present. There is no biliary ductal dilatation. There is diffuse fatty infiltration of the liver. Pancreas: Unremarkable. No pancreatic ductal dilatation or surrounding inflammatory changes. Spleen: Normal in size without focal abnormality. Adrenals/Urinary  Tract: There is an indeterminate left adrenal nodule measuring 54 Hounsfield units and 2.2 by 1.9 cm. The right adrenal gland is within normal limits. There is no hydronephrosis or perinephric fluid. Bladder is within normal limits. There are rounded hypodensities within the right kidney which  are too small to characterize, most likely cysts. Stomach/Bowel: Stomach is within normal limits. Appendix appears normal. No evidence of bowel wall thickening, distention, or inflammatory changes. Vascular/Lymphatic: No significant vascular findings are present. No enlarged abdominal or pelvic lymph nodes. Reproductive: Status post hysterectomy. No adnexal masses. Other: There is a small fat containing umbilical hernia. There is no ascites. Musculoskeletal: No acute or significant osseous findings. IMPRESSION: 1. No acute localizing process in the abdomen or pelvis. 2. Cholelithiasis. 3. Fatty infiltration of the liver. 4. 2.2 cm left adrenal mass, probable benign adenoma. Recommend adrenal washout CT or chemical shift MRI. JACR 2017 Aug; 14(8):1038-44, JCAT 2016 Mar-Apr; 40(2):194-200, Urol J 2006 Spring; 3(2):71-4. Electronically Signed   By: Ronney Asters M.D.   On: 08/10/2021 16:26   DG Chest Port 1 View  Result Date: 09/01/2021 CLINICAL DATA:  Dehydration, abdominal pain and constipation. EXAM: PORTABLE CHEST 1 VIEW COMPARISON:  Portable chest 08/06/2021 FINDINGS: The heart size and mediastinal contours are within normal limits aside from stable aortic tortuosity. Both lungs are clear. The visualized skeletal structures are intact with moderate thoracic marginal osteophytosis. IMPRESSION: No evidence of acute chest disease or interval changes. Electronically Signed   By: Telford Nab M.D.   On: 08/30/2021 06:05   NCV with EMG(electromyography)  Result Date: 08/14/2021 Penni Bombard, MD     08/14/2021  4:22 PM  [facility] NCS (NERVE CONDUCTION STUDY) WITH EMG (ELECTROMYOGRAPHY) REPORT  STUDY DATE: 08/13/21 PATIENT NAME: ALVAH DUTKIEWICZ DOB: [birth year] MRN: [id number] ORDERING CLINICIAN: Alric Ran, MD TECHNOLOGIST: Sherre Scarlet ELECTROMYOGRAPHER: Earlean Polka. Penumalli, MD CLINICAL INFORMATION: 61 year old female with extremity weakness. FINDINGS: NERVE CONDUCTION STUDY: Bilateral peroneal motor responses have decreased amplitudes and normal conduction velocities. Bilateral tibial motor responses are normal.  Bilateral tibial F wave latencies are normal. Bilateral sural and superficial peroneal sensory responses are normal. NEEDLE ELECTROMYOGRAPHY: Needle examination of left lower extremity notable for early recruitment of polyphasic, small amplitude motor units in the left tibialis anterior and left gastrocnemius muscles.  Left vastus medialis has normal recruitment of small polyphasic motor units. 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  Decreased amplitude of bilateral peroneal motor responses.  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 INTERPRETING PHYSICIAN: Penni Bombard, MD Certified in Neurology, Neurophysiology and Neuroimaging [facility] [address], [place], [place] [address] [phone] [facility]   Nerve / Sites Muscle Latency Ref. Amplitude Ref. Rel Amp Segments Distance Velocity Ref. Area   ms ms mV mV %  cm m/s m/s mVms L Peroneal - EDB    Ankle EDB 5.3 ?6.5 0.7 ?2.0 100 Ankle - EDB 9   3.2    Fib head EDB 11.7  0.5  74.8 Fib head - Ankle 28 44 ?44 1.6    Pop fossa EDB 14.0  0.5  94.6 Pop fossa - Fib head 10 44 ?44 2.0        Pop fossa - Ankle     R Peroneal - EDB    Ankle EDB 4.5 ?6.5 1.5 ?2.0 100 Ankle - EDB 9   8.5    Fib head EDB 11.1  1.2  81.8 Fib head - Ankle 29 44 ?44 7.5    Pop fossa EDB 13.4  1.2  96.5 Pop fossa - Fib head 10 44 ?44 7.2        Pop fossa - Ankle     L Tibial - AH  Ankle AH 4.4 ?5.8 5.1 ?4.0 100 Ankle - AH 9   17.3    Pop fossa AH 14.2  4.0  79.9 Pop  fossa - Ankle 40 41 ?41 13.9 R Tibial - AH    Ankle AH 3.9 ?5.8 5.9 ?4.0 100 Ankle - AH 9   13.9    Pop fossa AH 13.3  4.0  68.4 Pop fossa - Ankle 39 41 ?41 15.1           SNC   Nerve / Sites Rec. Site Peak Lat Ref.  Amp Ref. Segments Distance   ms ms V V  cm L Sural - Ankle (Calf)    Calf Ankle 4.1 ?4.4 10 ?6 Calf - Ankle 14 R Sural - Ankle (Calf)    Calf Ankle 4.4 ?4.4 10 ?6 Calf - Ankle 14 L Superficial peroneal - Ankle    Lat leg Ankle 4.0 ?4.4 6 ?6 Lat leg - Ankle 14 R Superficial peroneal - Ankle    Lat leg Ankle 4.2 ?4.4 10 ?6 Lat leg - Ankle 14           F  Wave   Nerve F Lat Ref.  ms ms L Tibial - AH 53.3 ?56.0 R Tibial - AH 53.8 ?56.0       EMG Summary Table   Spontaneous MUAP Recruitment Muscle IA Fib PSW Fasc Other Amp Dur. Poly Pattern L. Tibialis anterior Normal None None None _______ Decreased Normal 1+ Early L. Gastrocnemius (Medial head) Normal None None None _______ Decreased Normal 1+ Early L. Vastus medialis Normal None None None _______ Decreased Normal 1+ Normal    US Abdomen Limited RUQ (LIVER/GB)  Result Date: 08/10/2021 CLINICAL DATA:  Abdominal pain.  Rule out gallstone. EXAM: ULTRASOUND ABDOMEN LIMITED RIGHT UPPER QUADRANT COMPARISON:  03/31/2021 FINDINGS: Gallbladder: The gallbladder wall is thickened measuring 4 mm. Sludge is noted layering within the gallbladder. No gallstones. Negative sonographic Murphy's sign. Common bile duct: Diameter: 7 mm. Liver: Increased parenchymal echogenicity identified. No focal liver lesion identified. Portal vein is patent on color Doppler imaging with normal direction of blood flow towards the liver. Other: Exam detail is diminished due to bowel gas and patient's inability to breath hold. IMPRESSION: 1. Gallbladder sludge and gallbladder wall thickening. No gallstones or sonographic Murphy's sign. 2. Common bile duct is mildly dilated measuring 7 mm. Electronically Signed   By: Kerby Moors M.D.   On: 08/10/2021 15:44    ASSESSMENT & PLAN:  61  yo female   AMS, ?  Metabolic encephalopathy Thrombocytopenia and normacytic anemia  AKI and hyponatremia secondary to dehydration  Hypertension Rheumatoid arthritis Depression and anxiety Stage III sacral ulcer Folate deficiency   Recommendations: -her lab showed low serum folate, which can contributed to her anemia and thrombocytopenia.  She is on folic acid now. -Total bilirubin and reticulocyte count was normal, no lab evidence of hemolysis -normal spleen on today's CT. CT AP was unremarkable -I reviewed her peripheral smear, which showed polychromasia, occasional schistocytes.  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 ITP, however this is a diagnosis of exclusion, and at the current level of platelet, she would not need treatment anyway.  I will order immature platelet fraction. -I will f/u her labs, and see her  as needed      Truitt Merle, MD 08/09/2021

## 2021-08-17 NOTE — Consult Note (Signed)
[place] Nurse Consult Note: ?Reason for Consult:Stage 3 sacral pressure injury ?Wound type:pressure ?Pressure Injury POA: Yes ?Measurement:3cm x 1cm x 0.2cm  ?Wound bed:80% red, moist, 20% yellow ?Drainage (amount, consistency, odor) moderate serous ?Periwound:intact, mild erythema ?Dressing procedure/placement/frequency:I will continue the POC in place from [facility] i.e., silver hydrofiber (Aquacel Ag+, [place] # [id number]) with daily changes. Turning and repositioning is in place and time in the supine position is to be minimized. Heels are to be floated. ? ?[place] nursing team will not follow, but will remain available to this patient, the nursing and medical teams.  Please re-consult if needed. ?Thanks, ?Maudie Flakes, MSN, RN, [place], [place], CWON-AP, [place]  ?Pager# [phone]  ? ? ? ?  ?

## 2021-08-17 NOTE — H&P (Addendum)
History and Physical    Patient: Carrie [name] DOB: Feb 06, 1961 DOA: 08/10/2021 DOS: the patient was seen and examined on 08/09/2021 PCP: Ann Held, DO  Patient coming from: Home  Chief Complaint:  Chief Complaint  Patient presents with   Abdominal Pain   HPI: Carrie [name] is a 61 y.o. female with medical history significant of depression with anxiety, dermatomyositis, fatty liver disease, hyperlipidemia, hypertension, nontraumatic rhabdomyolysis, presyncope, history of rheumatoid arthritis on hydroxychloroquine, mycophenolate and daily prednisone who is brought to the emergency department for the third time in the past 12 days due to altered mental status, abdominal pain, constipation for several days, decreased oral intake with dehydration.  She has been urinating less than usual.  She was seen a week ago and on the first of this month for similar complaints.  According to her niece, her prednisone was lowered to 17.5 mg/day, but subsequently had to be increased back to 40 mg a day.  She has had similar symptoms in the past when her prednisone is tapered down.  The patient is only oriented to name, but able to answer simple questions.  Denies headache, sore throat, dyspnea, back or chest pain.  ED course: Initial vital signs were temperature 98.8 F, pulse 77, respiration 18, BP 97/83 mmHg and O2 sat 98% on room air.  She received Xanax 5000 mg IVP, fentanyl 25 mcg IVP x2 and ceftriaxone 2 g IVPB.  I added LR 1000 mL bolus and dexamethasone 6 mg IVP.  Lab work: Urinalysis was hazy with a specific gravity of 1.040, ketonuria 5 and proteinuria 30 mg/dL.  The rest of the urinalysis measurements were not unremarkable.  CBC showed a white count of 3.7, hemoglobin 10.7 g/dL platelets 91.  PT 14.2 and INR 1.1.  Troponin x2 normal.  Magnesium, phosphorus and lipase were normal.  Lactic acid was 3.1 mmol/L.  CMP showed a sodium of 134 and CO2 of 17 mmol/L with an anion gap of 10.   The rest of the electrolytes were within expected range.  Glucose 113, BUN 36 and creatinine 1.37 mg/dL.  Total protein is 5.0 and albumin 2.7 g/dL.  AST slightly elevated at 44 units/L.  Unremarkable ALT, alk phos and total bilirubin.  Imaging: Portable 1 view chest radiograph showed normal heart size and mediastinal contours.  No acute cardiopulmonary pathology seen.  CT head with mild cerebral atrophy and chronic microvascular ischemic changes in the white matter.  However, no acute intracranial abnormalities were seen.  CT abdomen with no acute findings.  There was diverticulosis, there was a left adrenal nodule with nonemergent adrenal protocol CT scan recommended.  Please see images and full radiology report for further details.   Review of Systems: As mentioned in the history of present illness. All other systems reviewed and are negative. Past Medical History:  Diagnosis Date   Depression with anxiety 05/26/2021   Dermatomyositis ([place]) 06/23/2021   Fatty liver 06/23/2021   Hyperlipidemia    Hypertension    Non-traumatic rhabdomyolysis    Pre-syncope 03/26/2021   Rheumatoid arthritis flare ([place]) 04/08/2021   Past Surgical History:  Procedure Laterality Date   ABDOMINAL HYSTERECTOMY     REDUCTION MAMMAPLASTY     Social History:  reports that she quit smoking about 7 years ago. Her smoking use included cigarettes. She has never used smokeless tobacco. She reports that she does not currently use alcohol. She reports that she does not use drugs.  Allergies  Allergen Reactions  Zoloft [Sertraline Hcl] Diarrhea and Other (See Comments)    Stomach upset     Family History  Problem Relation Age of Onset   Breast cancer Mother        deceased by 69   Breast cancer Sister 36   Diabetes Sister    Breast cancer Other    Coronary artery disease Other    Hypertension Other     Prior to Admission medications   Medication Sig Start Date End Date Taking? Authorizing Provider   ARIPiprazole (ABILIFY) 5 MG tablet Take 5 mg by mouth at bedtime.   Yes [provider]  buPROPion (WELLBUTRIN XL) 300 MG 24 hr tablet Take 1 tablet (300 mg total) by mouth daily. 05/26/21  Yes Seabron Spates R, DO  escitalopram (LEXAPRO) 20 MG tablet Take 1 tablet (20 mg total) by mouth daily. 05/26/21  Yes Donato Schultz, DO  hydroxychloroquine (PLAQUENIL) 200 MG tablet Take 200 mg by mouth daily.   Yes [provider]  lisinopril (ZESTRIL) 20 MG tablet Take 1 tablet (20 mg total) by mouth daily. 05/26/21  Yes Seabron Spates R, DO  mycophenolate (CELLCEPT) 500 MG tablet Take 1,000 mg by mouth 2 (two) times daily.   Yes [provider]  pantoprazole (PROTONIX) 40 MG tablet Take 1 tablet (40 mg total) by mouth daily. 05/26/21  Yes Seabron Spates R, DO  predniSONE (DELTASONE) 10 MG tablet Take 40 mg by mouth daily. 06/20/21  Yes [provider]  senna-docusate (SENOKOT-S) 8.6-50 MG tablet Take 1 tablet by mouth 2 (two) times daily. 04/29/21  Yes Angiulli, Mcarthur Rossetti, PA-C  traMADol (ULTRAM) 50 MG tablet Take 1 tablet (50 mg total) by mouth every 12 (twelve) hours as needed for moderate pain. 05/26/21  Yes Donato Schultz, DO  Vitamin D3 (VITAMIN D) 25 MCG tablet Take 1 tablet (1,000 Units total) by mouth daily. 05/26/21  Yes Donato Schultz, DO  acetaminophen (TYLENOL) 325 MG tablet Take 2 tablets (650 mg total) by mouth every 6 (six) hours as needed for mild pain (or Fever >/= 101). 04/29/21   Angiulli, Mcarthur Rossetti, PA-C  bisacodyl (DULCOLAX) 10 MG suppository Place 1 suppository (10 mg total) rectally daily as needed for up to 10 doses for moderate constipation. 08/10/21   Terald Sleeper, MD  bisacodyl (DULCOLAX) 5 MG EC tablet Take 1 tablet (5 mg total) by mouth daily as needed for up to 10 doses for moderate constipation. 08/10/21   Terald Sleeper, MD  fluticasone (FLONASE) 50 MCG/ACT nasal spray Place 2 sprays into both nostrils  daily. Patient taking differently: Place 2 sprays into both nostrils daily as needed for allergies. 12/24/20   Donato Schultz, DO  loratadine (CLARITIN) 10 MG tablet Take 1 tablet (10 mg total) by mouth daily. 12/24/20   Donato Schultz, DO  traMADol (ULTRAM) 50 MG tablet Take 1 tablet (50 mg total) by mouth every 12 (twelve) hours as needed for up to 10 doses for severe pain. 08/10/21   Terald Sleeper, MD    Physical Exam: Vitals:   08/10/2021 0630 08/30/2021 0714 08/06/2021 0752 08/12/2021 0903  BP: 105/75 105/70 113/82 104/83  Pulse: 95 95 99 91  Resp: (!) 28 (!) 24 20 (!) 22  Temp:   97.6 F (36.4 C) 97.6 F (36.4 C)  TempSrc:   Oral Oral  SpO2: 96% 96% 97% 97%  Weight:      Height:  Physical Exam Vitals and nursing note reviewed.  Constitutional:      Appearance: She is well-developed.  HENT:     Head: Normocephalic.     Mouth/Throat:     Mouth: Mucous membranes are dry.     Comments: Lip and oral mucosa very dry. Eyes:     Pupils: Pupils are equal, round, and reactive to light.  Neck:     Vascular: No JVD.  Cardiovascular:     Rate and Rhythm: Normal rate and regular rhythm.     Heart sounds: S1 normal and S2 normal.  Pulmonary:     Effort: Pulmonary effort is normal.     Breath sounds: Normal breath sounds. No wheezing or rhonchi.  Abdominal:     Palpations: Abdomen is soft.     Tenderness: There is generalized abdominal tenderness. There is no guarding or rebound.     Hernia: There is no hernia in the umbilical area.  Musculoskeletal:     Cervical back: Neck supple.     Right lower leg: No edema.     Left lower leg: No edema.  Skin:    General: Skin is warm and dry.     Findings: Wound present.     Comments: 2 x 1.5 cm just over the distal second metacarpal area.  No erythema or discharge.  Wound seems healing.  Neurological:     General: No focal deficit present.     Mental Status: She is alert. She is disoriented.     Comments: Only oriented to  name.  Able to recognize family members.  Psychiatric:        Behavior: Behavior is cooperative.        Cognition and Memory: Cognition is impaired. She exhibits impaired recent memory.     Comments: Was tearful for moment.  Mildly anxious mood.    Data Reviewed:  There are no new results to review at this time.  Assessment and Plan: Principal Problem:   Lactic acidosis In the setting of  withdrawing from   Long-term corticosteroid use  adrenal crisis? Abdominal pain/N/V after prednisone decreased. Place in observation/PCU. Continue IV fluids. Follow lactic acid level. Dexamethasone 6 mg IVP x1. Resume prednisone 40 mg p.o. QD in AM. Follow-up CBC and chemistry.  Active Problems:   Pancytopenia (HCC) Hold hydroxychloroquine and mycophenolate. Had a higher than normal B12 level in January. Had normal/higher than normal B12 levels in the past. Check folic acid and reticulocyte count. Consult hematology/oncology. Follow complete blood count in the morning.    Volume depletion Continue IV fluids. Monitor intake and output. Follow-up renal function electrolytes.   Generalized weakness Secondary to above. Continue current treatment. Consider PT evaluation.    Hyperlipidemia Not on medical therapy. History of fatty liver disease. Monitor LFTs.    Hypertension Hold lisinopril. As needed antihypertensive.    Rheumatoid arthritis ([facility]CC) Hold hydroxychloroquine. Hold mycophenolate. Resume prednisone.    Depression with anxiety Continue Abilify 5 mg p.o. at bedtime. Continue bupropion 300 mg p.o. daily. Continue escitalopram 20 mg p.o. daily.    Adrenal nodule (HCC) Nonemergent contrast adrenal protocol CT recommended.    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 Consult wound/ostomy care.    Open wound of right hand Continue local care.    Advance Care Planning: Full code.    Consults: Hematology (Dr. Burr Medico)  Family Communication: Spoke to her  sister Kalman Shan over the phone. Her niece was in the ED room.  Severity of Illness: The  appropriate patient status for this patient is OBSERVATION. Observation status is judged to be reasonable and necessary in order to provide the required intensity of service to ensure the patient's safety. The patient's presenting symptoms, physical exam findings, and initial radiographic and laboratory data in the context of their medical condition is felt to place them at decreased risk for further clinical deterioration. Furthermore, it is anticipated that the patient will be medically stable for discharge from the hospital within 2 midnights of admission.   Author: Reubin Milan, MD 08/29/2021 9:26 AM  For on call review [email or web address].   This document was prepared in [employer] and may contain some unintended transcription errors.

## 2021-08-18 ENCOUNTER — Inpatient Hospital Stay (HOSPITAL_COMMUNITY): Payer: Federal, State, Local not specified - PPO

## 2021-08-18 DIAGNOSIS — Z66 Do not resuscitate: Secondary | ICD-10-CM | POA: Diagnosis not present

## 2021-08-18 DIAGNOSIS — G0490 Encephalitis and encephalomyelitis, unspecified: Secondary | ICD-10-CM | POA: Diagnosis not present

## 2021-08-18 DIAGNOSIS — J189 Pneumonia, unspecified organism: Secondary | ICD-10-CM | POA: Diagnosis not present

## 2021-08-18 DIAGNOSIS — R0603 Acute respiratory distress: Secondary | ICD-10-CM | POA: Diagnosis not present

## 2021-08-18 DIAGNOSIS — Y95 Nosocomial condition: Secondary | ICD-10-CM | POA: Diagnosis not present

## 2021-08-18 DIAGNOSIS — K8020 Calculus of gallbladder without cholecystitis without obstruction: Secondary | ICD-10-CM | POA: Diagnosis not present

## 2021-08-18 DIAGNOSIS — M069 Rheumatoid arthritis, unspecified: Secondary | ICD-10-CM | POA: Diagnosis not present

## 2021-08-18 DIAGNOSIS — E8729 Other acidosis: Secondary | ICD-10-CM | POA: Diagnosis present

## 2021-08-18 DIAGNOSIS — Z4682 Encounter for fitting and adjustment of non-vascular catheter: Secondary | ICD-10-CM | POA: Diagnosis not present

## 2021-08-18 DIAGNOSIS — R6521 Severe sepsis with septic shock: Secondary | ICD-10-CM | POA: Diagnosis not present

## 2021-08-18 DIAGNOSIS — R600 Localized edema: Secondary | ICD-10-CM | POA: Diagnosis not present

## 2021-08-18 DIAGNOSIS — K7200 Acute and subacute hepatic failure without coma: Secondary | ICD-10-CM | POA: Diagnosis not present

## 2021-08-18 DIAGNOSIS — R569 Unspecified convulsions: Secondary | ICD-10-CM | POA: Diagnosis not present

## 2021-08-18 DIAGNOSIS — R570 Cardiogenic shock: Secondary | ICD-10-CM | POA: Diagnosis not present

## 2021-08-18 DIAGNOSIS — D61810 Antineoplastic chemotherapy induced pancytopenia: Secondary | ICD-10-CM | POA: Diagnosis not present

## 2021-08-18 DIAGNOSIS — R109 Unspecified abdominal pain: Secondary | ICD-10-CM | POA: Diagnosis not present

## 2021-08-18 DIAGNOSIS — D65 Disseminated intravascular coagulation [defibrination syndrome]: Secondary | ICD-10-CM | POA: Diagnosis present

## 2021-08-18 DIAGNOSIS — M3313 Other dermatomyositis without myopathy: Secondary | ICD-10-CM | POA: Diagnosis present

## 2021-08-18 DIAGNOSIS — K760 Fatty (change of) liver, not elsewhere classified: Secondary | ICD-10-CM | POA: Diagnosis present

## 2021-08-18 DIAGNOSIS — R112 Nausea with vomiting, unspecified: Secondary | ICD-10-CM | POA: Diagnosis not present

## 2021-08-18 DIAGNOSIS — Y848 Other medical procedures as the cause of abnormal reaction of the patient, or of later complication, without mention of misadventure at the time of the procedure: Secondary | ICD-10-CM | POA: Diagnosis not present

## 2021-08-18 DIAGNOSIS — J9601 Acute respiratory failure with hypoxia: Secondary | ICD-10-CM | POA: Diagnosis not present

## 2021-08-18 DIAGNOSIS — G9340 Encephalopathy, unspecified: Secondary | ICD-10-CM | POA: Diagnosis not present

## 2021-08-18 DIAGNOSIS — J90 Pleural effusion, not elsewhere classified: Secondary | ICD-10-CM | POA: Diagnosis not present

## 2021-08-18 DIAGNOSIS — J9690 Respiratory failure, unspecified, unspecified whether with hypoxia or hypercapnia: Secondary | ICD-10-CM | POA: Diagnosis not present

## 2021-08-18 DIAGNOSIS — R4182 Altered mental status, unspecified: Secondary | ICD-10-CM | POA: Diagnosis not present

## 2021-08-18 DIAGNOSIS — E11649 Type 2 diabetes mellitus with hypoglycemia without coma: Secondary | ICD-10-CM | POA: Diagnosis not present

## 2021-08-18 DIAGNOSIS — Z7952 Long term (current) use of systemic steroids: Secondary | ICD-10-CM | POA: Diagnosis not present

## 2021-08-18 DIAGNOSIS — B377 Candidal sepsis: Secondary | ICD-10-CM | POA: Diagnosis not present

## 2021-08-18 DIAGNOSIS — B379 Candidiasis, unspecified: Secondary | ICD-10-CM | POA: Diagnosis not present

## 2021-08-18 DIAGNOSIS — R0902 Hypoxemia: Secondary | ICD-10-CM | POA: Diagnosis not present

## 2021-08-18 DIAGNOSIS — E872 Acidosis, unspecified: Secondary | ICD-10-CM | POA: Diagnosis not present

## 2021-08-18 DIAGNOSIS — B49 Unspecified mycosis: Secondary | ICD-10-CM | POA: Diagnosis not present

## 2021-08-18 DIAGNOSIS — D761 Hemophagocytic lymphohistiocytosis: Secondary | ICD-10-CM | POA: Diagnosis not present

## 2021-08-18 DIAGNOSIS — L89894 Pressure ulcer of other site, stage 4: Secondary | ICD-10-CM | POA: Diagnosis present

## 2021-08-18 DIAGNOSIS — Z5111 Encounter for antineoplastic chemotherapy: Secondary | ICD-10-CM | POA: Diagnosis not present

## 2021-08-18 DIAGNOSIS — G9341 Metabolic encephalopathy: Secondary | ICD-10-CM | POA: Diagnosis not present

## 2021-08-18 DIAGNOSIS — T80211A Bloodstream infection due to central venous catheter, initial encounter: Secondary | ICD-10-CM | POA: Diagnosis not present

## 2021-08-18 DIAGNOSIS — D701 Agranulocytosis secondary to cancer chemotherapy: Secondary | ICD-10-CM | POA: Diagnosis not present

## 2021-08-18 DIAGNOSIS — Z20822 Contact with and (suspected) exposure to covid-19: Secondary | ICD-10-CM | POA: Diagnosis present

## 2021-08-18 DIAGNOSIS — Z0389 Encounter for observation for other suspected diseases and conditions ruled out: Secondary | ICD-10-CM | POA: Diagnosis not present

## 2021-08-18 DIAGNOSIS — D61818 Other pancytopenia: Secondary | ICD-10-CM | POA: Diagnosis not present

## 2021-08-18 DIAGNOSIS — F418 Other specified anxiety disorders: Secondary | ICD-10-CM

## 2021-08-18 DIAGNOSIS — R410 Disorientation, unspecified: Secondary | ICD-10-CM | POA: Diagnosis not present

## 2021-08-18 DIAGNOSIS — A419 Sepsis, unspecified organism: Secondary | ICD-10-CM | POA: Diagnosis not present

## 2021-08-18 DIAGNOSIS — D84821 Immunodeficiency due to drugs: Secondary | ICD-10-CM | POA: Diagnosis present

## 2021-08-18 DIAGNOSIS — E278 Other specified disorders of adrenal gland: Secondary | ICD-10-CM | POA: Diagnosis not present

## 2021-08-18 DIAGNOSIS — N170 Acute kidney failure with tubular necrosis: Secondary | ICD-10-CM | POA: Diagnosis present

## 2021-08-18 DIAGNOSIS — R531 Weakness: Secondary | ICD-10-CM | POA: Diagnosis not present

## 2021-08-18 DIAGNOSIS — K828 Other specified diseases of gallbladder: Secondary | ICD-10-CM | POA: Diagnosis not present

## 2021-08-18 DIAGNOSIS — R918 Other nonspecific abnormal finding of lung field: Secondary | ICD-10-CM | POA: Diagnosis not present

## 2021-08-18 DIAGNOSIS — L89304 Pressure ulcer of unspecified buttock, stage 4: Secondary | ICD-10-CM | POA: Diagnosis not present

## 2021-08-18 DIAGNOSIS — R7989 Other specified abnormal findings of blood chemistry: Secondary | ICD-10-CM | POA: Diagnosis not present

## 2021-08-18 DIAGNOSIS — R846 Abnormal cytological findings in specimens from respiratory organs and thorax: Secondary | ICD-10-CM | POA: Diagnosis not present

## 2021-08-18 DIAGNOSIS — Z515 Encounter for palliative care: Secondary | ICD-10-CM | POA: Diagnosis not present

## 2021-08-18 DIAGNOSIS — I10 Essential (primary) hypertension: Secondary | ICD-10-CM | POA: Diagnosis not present

## 2021-08-18 DIAGNOSIS — R0682 Tachypnea, not elsewhere classified: Secondary | ICD-10-CM | POA: Diagnosis not present

## 2021-08-18 DIAGNOSIS — I517 Cardiomegaly: Secondary | ICD-10-CM | POA: Diagnosis not present

## 2021-08-18 DIAGNOSIS — J690 Pneumonitis due to inhalation of food and vomit: Secondary | ICD-10-CM | POA: Diagnosis not present

## 2021-08-18 DIAGNOSIS — Z7189 Other specified counseling: Secondary | ICD-10-CM | POA: Diagnosis not present

## 2021-08-18 DIAGNOSIS — G9349 Other encephalopathy: Secondary | ICD-10-CM | POA: Diagnosis not present

## 2021-08-18 DIAGNOSIS — L89154 Pressure ulcer of sacral region, stage 4: Secondary | ICD-10-CM | POA: Diagnosis present

## 2021-08-18 LAB — HEMOGLOBIN A1C
Hgb A1c MFr Bld: 6.2 % — ABNORMAL HIGH (ref 4.8–5.6)
Mean Plasma Glucose: 131 mg/dL

## 2021-08-18 LAB — BLOOD CULTURE ID PANEL (REFLEXED) - BCID2

## 2021-08-18 LAB — CBC
HCT: 24.6 % — ABNORMAL LOW (ref 36.0–46.0)
Hemoglobin: 8.1 g/dL — ABNORMAL LOW (ref 12.0–15.0)
MCH: 29.5 pg (ref 26.0–34.0)
MCHC: 32.9 g/dL (ref 30.0–36.0)
MCV: 89.5 fL (ref 80.0–100.0)
Platelets: 60 10*3/uL — ABNORMAL LOW (ref 150–400)
RBC: 2.75 MIL/uL — ABNORMAL LOW (ref 3.87–5.11)
RDW: 16.5 % — ABNORMAL HIGH (ref 11.5–15.5)
WBC: 2.4 10*3/uL — ABNORMAL LOW (ref 4.0–10.5)
nRBC: 0 % (ref 0.0–0.2)

## 2021-08-18 LAB — COMPREHENSIVE METABOLIC PANEL
ALT: 21 U/L (ref 0–44)
AST: 43 U/L — ABNORMAL HIGH (ref 15–41)
Albumin: 2.1 g/dL — ABNORMAL LOW (ref 3.5–5.0)
Alkaline Phosphatase: 50 U/L (ref 38–126)
Anion gap: 8 (ref 5–15)
BUN: 24 mg/dL — ABNORMAL HIGH (ref 8–23)
CO2: 18 mmol/L — ABNORMAL LOW (ref 22–32)
Calcium: 8.4 mg/dL — ABNORMAL LOW (ref 8.9–10.3)
Chloride: 109 mmol/L (ref 98–111)
Creatinine, Ser: 0.9 mg/dL (ref 0.44–1.00)
GFR, Estimated: >60 mL/min (ref >60)
Glucose, Bld: 91 mg/dL (ref 70–99)
Potassium: 3.3 mmol/L — ABNORMAL LOW (ref 3.5–5.1)
Sodium: 135 mmol/L (ref 135–145)
Total Bilirubin: 0.8 mg/dL (ref 0.3–1.2)
Total Protein: 3.9 g/dL — ABNORMAL LOW (ref 6.5–8.1)

## 2021-08-18 LAB — IMMATURE PLATELET FRACTION: Immature Platelet Fraction: 2.1 % (ref 1.2–8.6)

## 2021-08-18 LAB — ADAMTS13 ACTIVITY: Adamts 13 Activity: 87.8 % (ref >66.8)

## 2021-08-18 LAB — GLUCOSE, CAPILLARY
Glucose-Capillary: 140 mg/dL — ABNORMAL HIGH (ref 70–99)
Glucose-Capillary: 80 mg/dL (ref 70–99)
Glucose-Capillary: 105 mg/dL — ABNORMAL HIGH (ref 70–99)
Glucose-Capillary: 138 mg/dL — ABNORMAL HIGH (ref 70–99)
Glucose-Capillary: 159 mg/dL — ABNORMAL HIGH (ref 70–99)

## 2021-08-18 LAB — ADAMTS13 ACTIVITY REFLEX

## 2021-08-18 LAB — SEDIMENTATION RATE: Sed Rate: 4 mm/hr (ref 0–22)

## 2021-08-18 IMAGING — MR MR HEAD W/O CM
9 of 10 series · 41 of 48 positions shown · non-contrast
Comparison: Head CT from yesterday [DATE] brain MRI

CLINICAL DATA: Delirium.  Unable to follow commands

EXAM:
MRI HEAD WITHOUT CONTRAST
TECHNIQUE: Multiplanar, multiecho pulse sequences of the brain and surrounding
structures were obtained without intravenous contrast.

[Series 7: T2 · sagittal · 5.0mm · 0.47mm/px · 3 of 24 slices shown (1 of 3)]
[im 1/24]
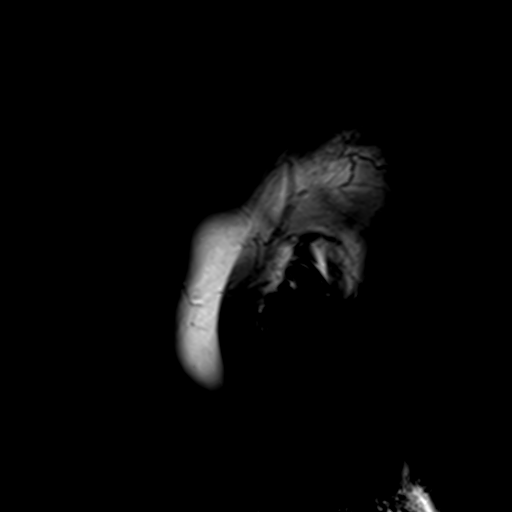
[im 12/24]
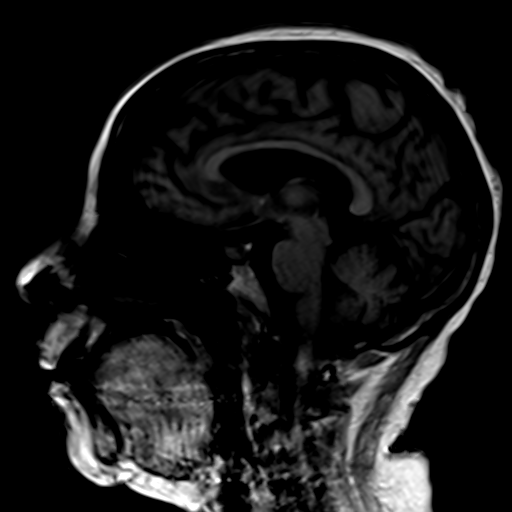
[im 24/24]
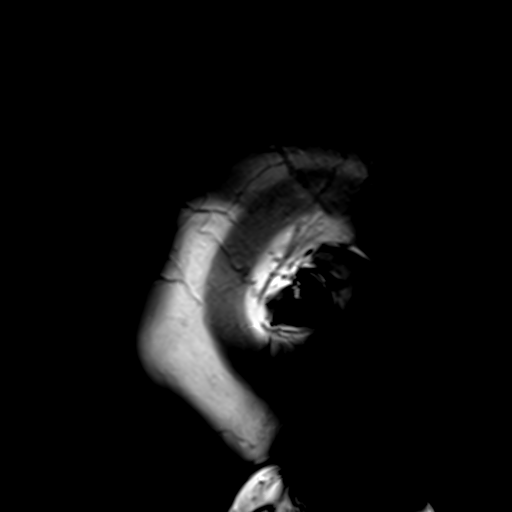

[Series 8: T2 · axial · 5.0mm · 0.45mm/px · z∈[-79,+76]mm · 3 of 25 slices shown (2 of 3)]
[im 1/25]
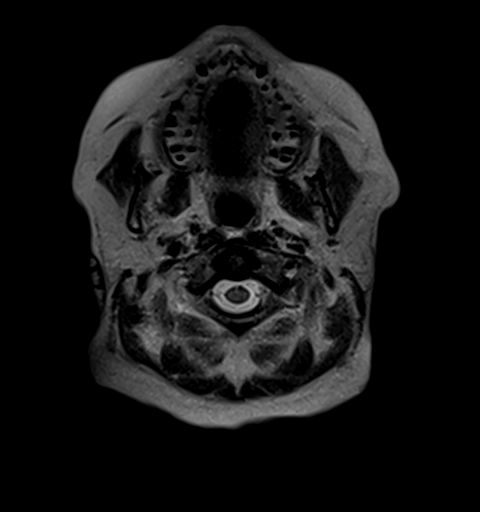
[im 13/25]
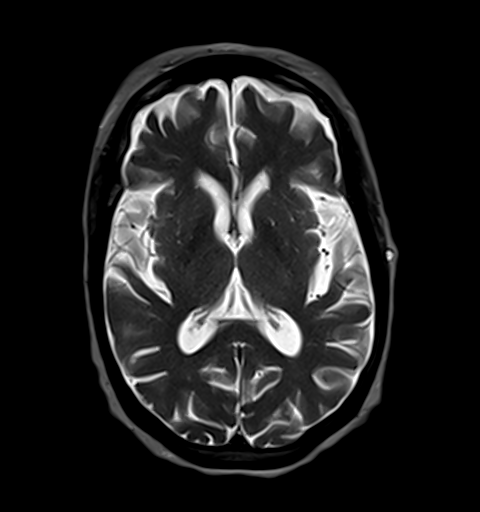
[im 25/25]
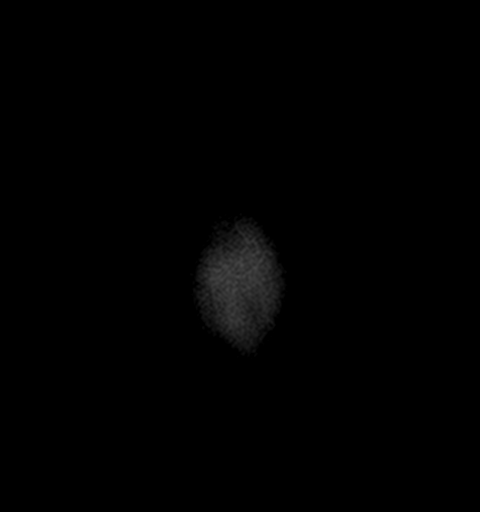

[Series 9: GRE · axial · 3.0mm · 0.45mm/px · z∈[-77,+72]mm · 5 of 51 slices shown]
[im 1/51]
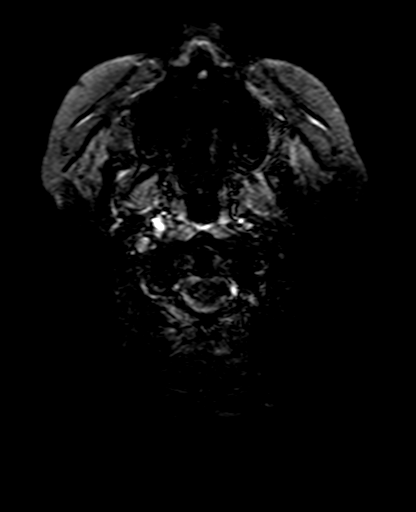
[im 13/51]
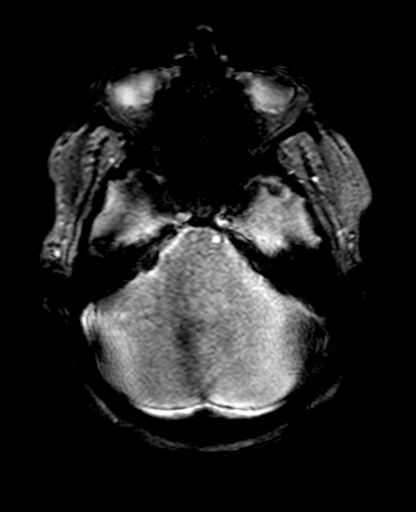
[im 26/51]
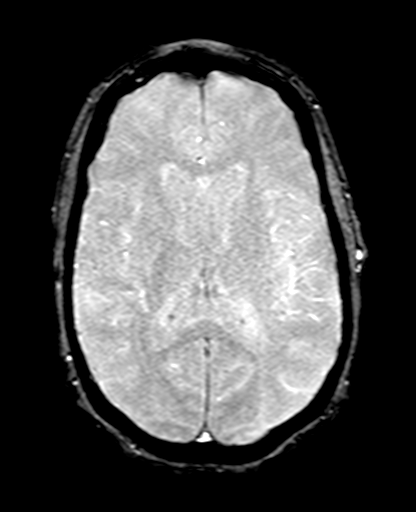
[im 38/51]
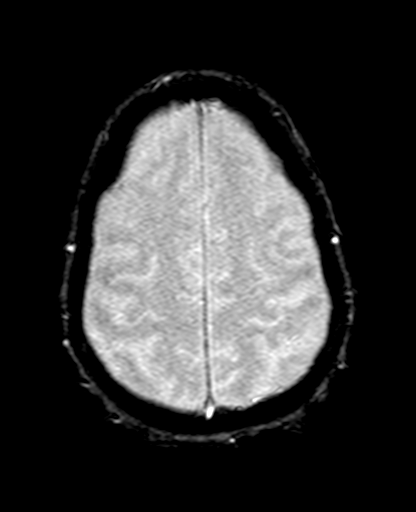
[im 51/51]
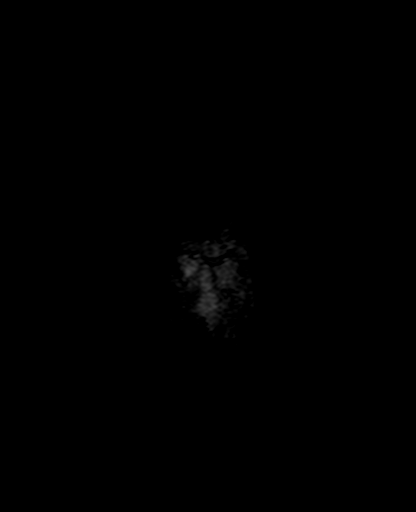

[Series 11: T1 · axial · 3.0mm · 0.45mm/px · z∈[-77,+72]mm · 5 of 51 slices shown]
[im 1/51]
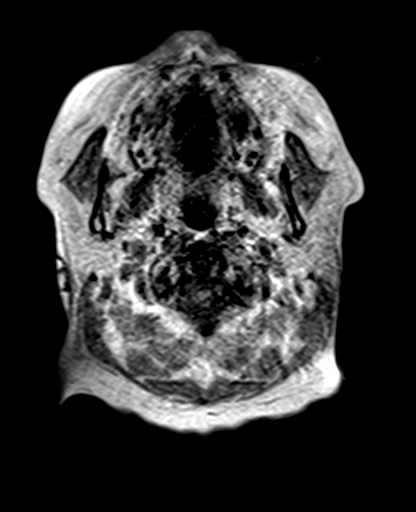
[im 13/51]
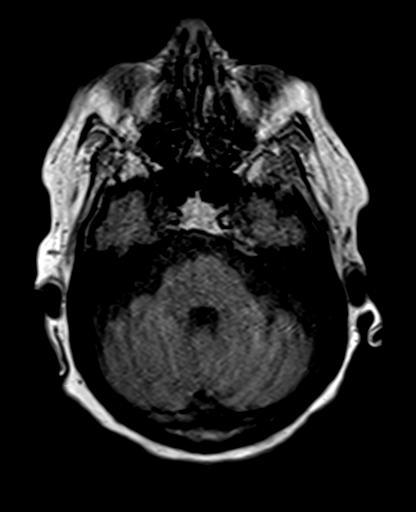
[im 26/51]
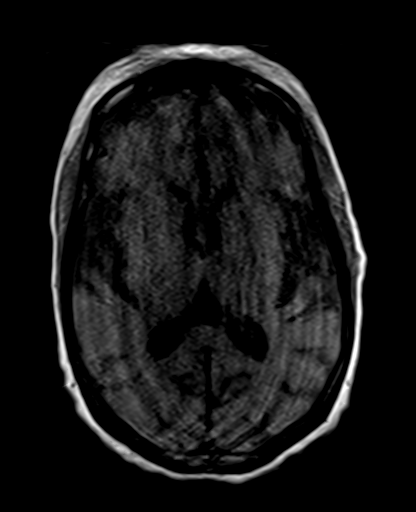
[im 38/51]
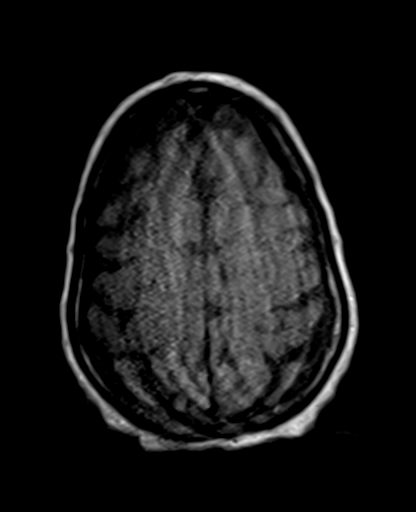
[im 51/51]
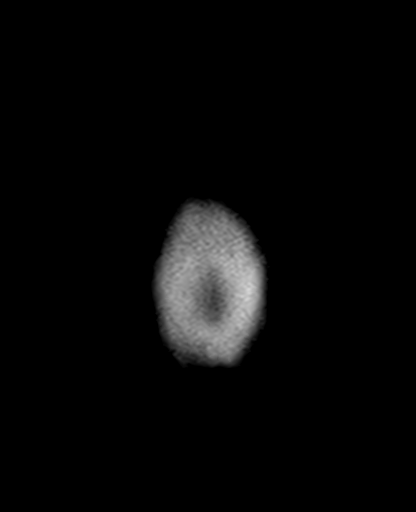

[Series 14: dwi_tracew · axial · 3.0mm · 1.08mm/px · z∈[-73,+76]mm · 8 of 102 slices shown]
[im 1/102]
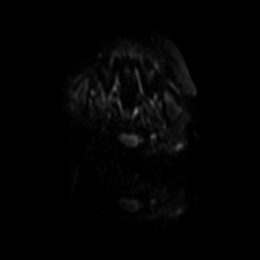
[im 12/102]
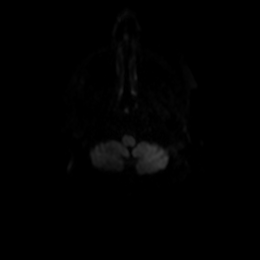
[im 34/102]
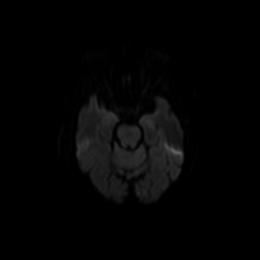
[im 45/102]
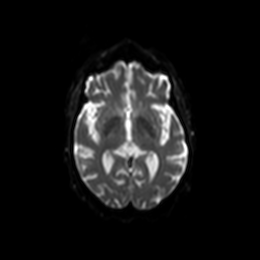
[im 57/102]
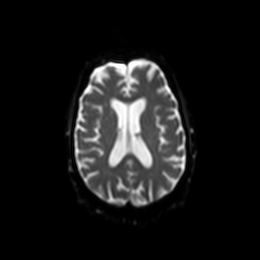
[im 68/102]
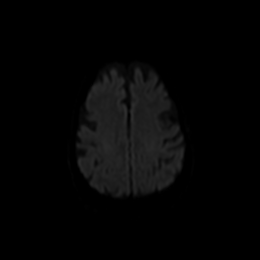
[im 90/102]
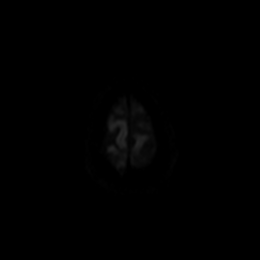
[im 102/102]
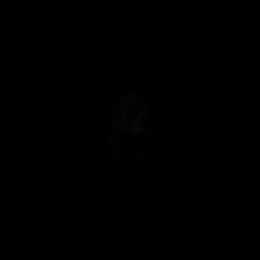

[Series 16: FLAIR · axial · 3.0mm · 0.86mm/px · z∈[-78,+72]mm · 5 of 51 slices shown]
[im 1/51]
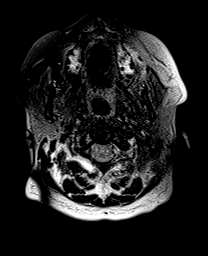
[im 13/51]
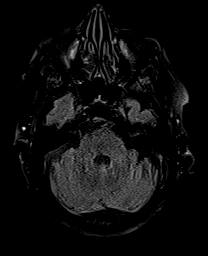
[im 26/51]
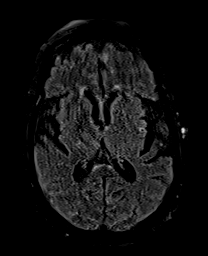
[im 38/51]
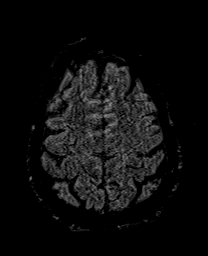
[im 51/51]
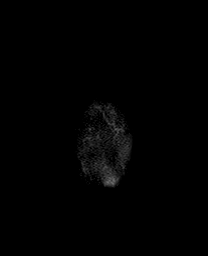

[Series 17: DWI · coronal · 5.0mm · 1.31mm/px · 6 of 56 slices shown (1 of 2)]
[im 1/56]
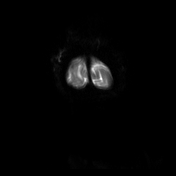
[im 12/56]
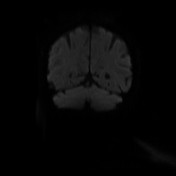
[im 23/56]
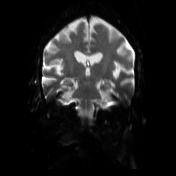
[im 34/56]
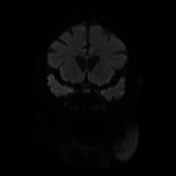
[im 45/56]
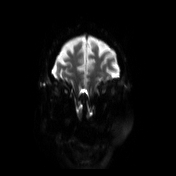
[im 56/56]
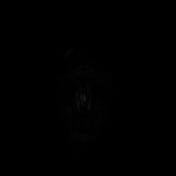

[Series 18: DWI · coronal · 5.0mm · 1.31mm/px · 3 of 28 slices shown (2 of 2)]
[im 1/28]
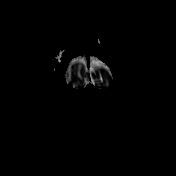
[im 14/28]
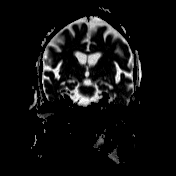
[im 28/28]
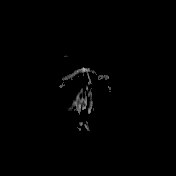

[Series 19: T2 · coronal · 5.0mm · 0.86mm/px · 3 of 28 slices shown (3 of 3)]
[im 1/28]
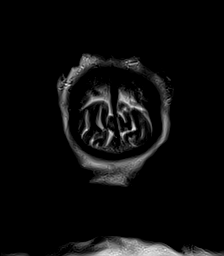
[im 14/28]
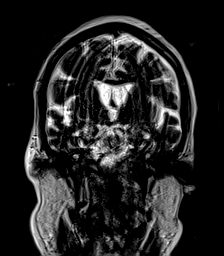
[im 28/28]
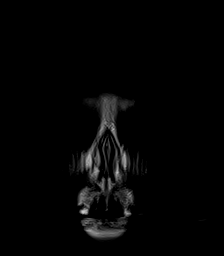

[41 of 48 positions shown; findings below may reference images not displayed]

FINDINGS: Brain: No acute infarction, hemorrhage, hydrocephalus, extra-axial
collection or mass lesion. Cerebral volume loss without specific
pattern.

Vascular: Normal flow voids.

Skull and upper cervical spine: Normal marrow signal.

Sinuses/Orbits: Bilateral cataract resection. Minor mastoid
opacification on the left more than right. Negative nasopharynx.

Other: Motion degraded study requiring fast brain protocol.
IMPRESSION: Motion degraded brain MRI without acute finding. No change since
[DATE].

## 2021-08-18 MED ORDER — ADULT MULTIVITAMIN W/MINERALS CH
1.0000 | ORAL_TABLET | Freq: Every day | ORAL | Status: DC
  Administered 2021-08-18 – 2021-08-23 (×3): 1 via ORAL
  Filled 2021-08-18 (×4): qty 1

## 2021-08-18 MED ORDER — POTASSIUM CHLORIDE 20 MEQ PO PACK
40.0000 meq | PACK | Freq: Two times a day (BID) | ORAL | Status: AC
  Administered 2021-08-18 (×2): 40 meq via ORAL
  Filled 2021-08-18 (×2): qty 2

## 2021-08-18 MED ORDER — ENSURE ENLIVE PO LIQD
237.0000 mL | Freq: Two times a day (BID) | ORAL | Status: DC
  Administered 2021-08-18 – 2021-08-21 (×4): 237 mL via ORAL

## 2021-08-18 MED ORDER — JUVEN PO PACK
1.0000 | PACK | Freq: Two times a day (BID) | ORAL | Status: DC
  Administered 2021-08-18 – 2021-08-20 (×3): 1 via ORAL
  Filled 2021-08-18 (×7): qty 1

## 2021-08-18 NOTE — Evaluation (Signed)
The paPhysical Therapy Evaluation ?Patient Details ?Name: Carrie [name] ?MRN: [id number] ?DOB: 03-Aug-1960 ?Today's Date: 08/18/2021 ? ?History of Present Illness ? Carrie [name] is a 61 y.o. female with medical history significant of depression with anxiety, dermatomyositis, fatty liver disease, hyperlipidemia, hypertension, nontraumatic rhabdomyolysis, presyncope, history of rheumatoid arthritis on hydroxychloroquine, mycophenolate and daily prednisone who is brought to the emergency department 2021/09/04 for the third time in the past 12 days due to altered mental status, abdominal pain, constipation for several days, decreased oral intake with dehydration  ?Clinical Impression ? The patient  requires max assistance  to mobilize to sitting, pivot from bed to recliner and back to the bed  . Patient reports that she does not walk. The patient comes from home and per [facility], will return  home.  Pt admitted with above diagnosis.  Pt currently with functional limitations due to the deficits listed below (see PT Problem List). Pt will benefit from skilled PT to increase their independence and safety with mobility to allow discharge to the venue listed below.   ? ?   ? ?Recommendations for follow up therapy are one component of a multi-disciplinary discharge planning process, led by the attending physician.  Recommendations may be updated based on patient status, additional functional criteria and insurance authorization. ? ?Follow Up Recommendations  HHPT/24/7 caregivers ? ? ?  ?Assistance Recommended at Discharge Frequent or constant Supervision/Assistance  ?Patient can return home with the following ? A lot of help with bathing/dressing/bathroom;A lot of help with walking and/or transfers;Help with stairs or ramp for entrance;Assistance with cooking/housework;Assist for transportation ? ?  ?Equipment Recommendations None recommended by PT  ?Recommendations for Other Services ?    ?  ?Functional Status Assessment Patient has  had a recent decline in their functional status and/or demonstrates limited ability to make significant improvements in function in a reasonable and predictable amount of time  ? ?  ?Precautions / Restrictions Precautions ?Precautions: Fall  ? ?  ? ?Mobility ? Bed Mobility ?Overal bed mobility: Needs Assistance ?Bed Mobility: Supine to Sit, Sit to Supine ?  ?  ?Supine to sit: Mod assist ?Sit to supine: Max assist ?  ?General bed mobility comments: assist  to raise trunk , move legs over bed edge. assist legs onto bed, assist with trunk ?  ? ?Transfers ?Overall transfer level: Needs assistance ?Equipment used: None ?Transfers: Bed to chair/wheelchair/BSC ?  ?  ?  ?Squat pivot transfers: Max assist ?  ?  ?General transfer comment: squat pivot  to ?  ? ?Ambulation/Gait ?  ?  ?  ?  ?  ?  ?  ?  ? ?Stairs ?  ?  ?  ?  ?  ? ?Wheelchair Mobility ?  ? ?Modified Rankin (Stroke Patients Only) ?  ? ?  ? ?Balance   ?  ?  ?  ?  ?  ?  ?  ?  ?  ?  ?  ?  ?  ?  ?  ?  ?  ?  ?   ? ? ? ?Pertinent Vitals/Pain Pain Assessment ?Pain Assessment: Faces ?Faces Pain Scale: Hurts even more ?Pain Location: yells out, "all over" ?Pain Descriptors / Indicators: Discomfort ?Pain Intervention(s): Limited activity within patient's tolerance  ? ? ?Home Living Family/patient expects to be discharged to:: Unsure ?  ?Available Help at Discharge: Family;Friend(s);Available PRN/intermittently ?Type of Home: House ?Home Access: Stairs to enter ?Entrance Stairs-Rails: Right;Left;Can reach both ?Entrance Stairs-Number of Steps: 2 ?Alternate Level Stairs-Number  of Steps: 10 ?Home Layout: Two level ?  ?Additional Comments: bed and bath are both on 2nd floor. first floor has half bath and kitchen  ?  ?Prior Function Prior Level of Function : Patient poor historian/Family not available ?  ?  ?  ?  ?  ?  ?Mobility Comments: patient states that she does not ambuate, she uses a WC, unsure if she has 24/7 caregivers.Patient was DC'd fro [facility] in 11/22 at mod assist WC  level ?  ?  ? ? ?Hand Dominance  ?   ? ?  ?Extremity/Trunk Assessment  ? Upper Extremity Assessment ?Upper Extremity Assessment: Generalized weakness ?  ? ?Lower Extremity Assessment ?Lower Extremity Assessment:  (grossl 3/5 leg strength, does not bear weight to stnad for any time) ?  ? ?Cervical / Trunk Assessment ?Cervical / Trunk Assessment: Other exceptions ?Cervical / Trunk Exceptions: tends to lean forward  ?Communication  ?    ?Cognition Arousal/Alertness: Awake/alert ?Behavior During Therapy: Anxious ?Overall Cognitive Status: No family/caregiver present to determine baseline cognitive functioning ?Area of Impairment: Orientation, Awareness, Safety/judgement ?  ?  ?  ?  ?  ?  ?  ?  ?Orientation Level: Time, Situation ?  ?  ?Following Commands: Follows one step commands inconsistently ?Safety/Judgement: Decreased awareness of deficits ?Awareness: Intellectual ?  ?General Comments: did state that she does not  ambulate ?  ?  ? ?  ?General Comments   ? ?  ?Exercises    ? ?Assessment/Plan  ?  ?PT Assessment Patient needs continued PT services  ?PT Problem List Decreased strength;Decreased mobility;Decreased activity tolerance;Decreased knowledge of precautions;Decreased cognition;Decreased knowledge of use of DME ? ?   ?  ?PT Treatment Interventions Therapeutic activities;Cognitive remediation;Patient/family education;Functional mobility training   ? ?PT Goals (Current goals can be found in the Care Plan section)  ?Acute Rehab PT Goals ?Patient Stated Goal: go back to bed ?PT Goal Formulation: Patient unable to participate in goal setting ?Time For Goal Achievement: 09/01/21 ?Potential to Achieve Goals: Fair ? ?  ?Frequency Min 3X/week ?  ? ? ?Co-evaluation   ?  ?  ?  ?  ? ? ?  ?AM-PAC PT "6 Clicks" Mobility  ?Outcome Measure Help needed turning from your back to your side while in a flat bed without using bedrails?: Total ?Help needed moving from lying on your back to sitting on the side of a flat bed without  using bedrails?: Total ?Help needed moving to and from a bed to a chair (including a wheelchair)?: Total ?Help needed standing up from a chair using your arms (e.g., wheelchair or bedside chair)?: Total ?Help needed to walk in hospital room?: Total ?Help needed climbing 3-5 steps with a railing? : Total ?6 Click Score: 6 ? ?  ?End of Session Equipment Utilized During Treatment: Gait belt ?Activity Tolerance: Patient limited by fatigue ?Patient left: in bed;with call bell/phone within reach;with bed alarm set ?Nurse Communication: Mobility status ?PT Visit Diagnosis: Unsteadiness on feet (R26.81);Muscle weakness (generalized) (M62.81) ?  ? ?Time: [id number] ?PT Time Calculation (min) (ACUTE ONLY): 29 min ? ? ?Charges:   PT Evaluation ?$PT Eval Low Complexity: 1 Low ?PT Treatments ?$Therapeutic Activity: 8-22 mins ?  ?   ? ? ?Blanchard KelchKaren Paytan Recine PT ?Acute Rehabilitation Services ?Pager [phone] ?Office [phone] ? ? ?Phuong Hillary, Jobe IgoKaren Elizabeth ?08/18/2021, 12:47 PM ? ?

## 2021-08-18 NOTE — Progress Notes (Signed)
Initial Nutrition Assessment ? ?DOCUMENTATION CODES:  ? ?Not applicable ? ?INTERVENTION:  ?- will order Ensure Plus High Protein BID, each supplement provides 350 kcal and 20 grams of protein. ?- will order 1 packet Juven BID, each packet provides 95 calories, 2.5 grams of protein (collagen), and 9.8 grams of carbohydrate (3 grams sugar); also contains 7 grams of L-arginine and L-glutamine, 300 mg vitamin C, 15 mg vitamin E, 1.2 mcg vitamin B-12, 9.5 mg zinc, 200 mg calcium, and 1.5 g  Calcium Beta-hydroxy-Beta-methylbutyrate to support wound healing. ?- will order 1 tablet multivitamin with minerals/day. ? ? ?NUTRITION DIAGNOSIS:  ? ?Increased nutrient needs related to acute illness, wound healing as evidenced by estimated needs. ? ?GOAL:  ? ?Patient will meet greater than or equal to 90% of their needs ? ?MONITOR:  ? ?PO intake, Supplement acceptance, Labs, Weight trends, Skin ? ?REASON FOR ASSESSMENT:  ? ?Malnutrition Screening Tool ? ?ASSESSMENT:  ? ?61 y.o. female past medical history of HTN, HLD, depression, anxiety, dermatomyositis, rheumatoid arthritis on Plaquenil, mycophenolate, and daily prednisone, and fatty liver. She presented to the ED due to AMS, abdominal pain, and several days of constipation with significantly decreased PO intake. Her niece reported that prednisone was decreased and then subsequently needed to be increased recently. Her niece reported that patient has had similar symptoms in the past when steroids have been tapered. She was admitted for lactic acidosis. ? ?Able to talk with RN prior to visiting patient's room. She shares that patient has not wanted anything PO today, including offer of something to drink. ? ?Patient laying in bed. She shares that family is planning to visit her later today and that her sister works here at [employer].  ? ?Patient and her nephew live together. That is wheelchair bound and has a wheelchair that requires someone to push her around. She does need  feeding assistance. She shares that her nephew is very helpful with her care and that he does the grocery shopping. ? ?No response given when RD asked patient is she follows any type of diet at home (she is currently ordered a Heart Healthy, Carb Modified diet here). She reports that a staff member fed her lunch today and that she ate well for the meal.  ? ?She drinks Ensure (or similar) once/day at home and is agreeable to receiving this supplement here. Also talked with her about Juven and she is agreeable to this supplement to aid in wound healing.  ? ?Weight today is 134 lb and weight on 06/23/21 was 140 lb. This indicates 6 lb weight loss (4% body weight) in the past 2 months; not significant for time frame.  ? ? ?Labs reviewed; CBGs: 80 and 105 mg/dl, K: 3.3 mmol/l, BUN: 24 mg/dl, Ca: 8.4 mg/dl ? ?Medications reviewed; 1 mg folvite/day, 40 mg oral protonix/day, 40 mEq Klor-Con BID, 40 mg deltasone/day, 1 tablet senokot BID, 1000 mcg oral cyanocobalamin/day.  ? ?IVF; LR @ 100 ml/hr.  ?  ? ?NUTRITION - FOCUSED PHYSICAL EXAM: ? ?Flowsheet Row Most Recent Value  ?Orbital Region No depletion  ?Upper Arm Region No depletion  ?Thoracic and Lumbar Region No depletion  ?Buccal Region No depletion  ?Temple Region No depletion  ?Clavicle Bone Region No depletion  ?Clavicle and Acromion Bone Region No depletion  ?Scapular Bone Region No depletion  ?Dorsal Hand No depletion  ?Patellar Region No depletion  ?Anterior Thigh Region No depletion  ?Posterior Calf Region No depletion  ?Edema (RD Assessment) None  ?Hair Reviewed  ?  Eyes Reviewed  ?Mouth Reviewed  ?Skin Reviewed  ?Nails Reviewed  ? ?  ? ? ?Diet Order:   ?Diet Order   ? ?       ?  Diet heart healthy/carb modified Room service appropriate? Yes; Fluid consistency: Thin  Diet effective now       ?  ? ?  ?  ? ?  ? ? ?EDUCATION NEEDS:  ? ?Education needs have been addressed ? ?Skin:  Skin Assessment: Skin Integrity Issues: ?Skin Integrity Issues:: Stage III ?Stage III: R  hand and sacrum ? ?Last BM:  3/12 (type 1 x2, both small amounts) ? ?Height:  ? ?Ht Readings from Last 1 Encounters:  ?08/06/2021 5\' 1"  (1.549 m)  ? ? ?Weight:  ? ?Wt Readings from Last 1 Encounters:  ?08/18/21 60.6 kg  ? ? ? ?BMI:  Body mass index is 25.24 kg/m?. ? ?Estimated Nutritional Needs:  ?Kcal:  1900-2100 kcal ?Protein:  95-110 grams ?Fluid:  >/= 2 L/day ? ? ? ? ?Jarome Matin, MS, RD, LDN ?Inpatient Clinical Dietitian ?RD pager # available in [place]  ?After hours/weekend pager # available in [place] ? ?

## 2021-08-18 NOTE — Progress Notes (Signed)
PHARMACY - PHYSICIAN COMMUNICATION ?CRITICAL VALUE ALERT - BLOOD CULTURE IDENTIFICATION (BCID) ? ?Carrie [name] is an 61 y.o. female who presented to [facility] on 2021/08/30 with a chief complaint of for altered mental status abdominal pain and constipation. ? ?Assessment:  1 of 3 bottles growing staph species, possible contaminant ? ?Name of physician (or Provider) Contacted: Dr. Radonna Ricker ? ?Current antibiotics: none ? ?Changes to prescribed antibiotics recommended:  ?Monitor off antibiotics ? ?Results for orders placed or performed during the hospital encounter of 2021/08/30  ?Blood Culture ID Panel (Reflexed) (Collected: 2021/08/30  5:15 AM)  ?Result Value Ref Range  ? Enterococcus faecalis NOT DETECTED NOT DETECTED  ? Enterococcus Faecium NOT DETECTED NOT DETECTED  ? Listeria monocytogenes NOT DETECTED NOT DETECTED  ? Staphylococcus species DETECTED (A) NOT DETECTED  ? Staphylococcus aureus (BCID) NOT DETECTED NOT DETECTED  ? Staphylococcus epidermidis NOT DETECTED NOT DETECTED  ? Staphylococcus lugdunensis NOT DETECTED NOT DETECTED  ? Streptococcus species NOT DETECTED NOT DETECTED  ? Streptococcus agalactiae NOT DETECTED NOT DETECTED  ? Streptococcus pneumoniae NOT DETECTED NOT DETECTED  ? Streptococcus pyogenes NOT DETECTED NOT DETECTED  ? A.calcoaceticus-baumannii NOT DETECTED NOT DETECTED  ? Bacteroides fragilis NOT DETECTED NOT DETECTED  ? Enterobacterales NOT DETECTED NOT DETECTED  ? Enterobacter cloacae complex NOT DETECTED NOT DETECTED  ? Escherichia coli NOT DETECTED NOT DETECTED  ? Klebsiella aerogenes NOT DETECTED NOT DETECTED  ? Klebsiella oxytoca NOT DETECTED NOT DETECTED  ? Klebsiella pneumoniae NOT DETECTED NOT DETECTED  ? Proteus species NOT DETECTED NOT DETECTED  ? Salmonella species NOT DETECTED NOT DETECTED  ? Serratia marcescens NOT DETECTED NOT DETECTED  ? Haemophilus influenzae NOT DETECTED NOT DETECTED  ? Neisseria meningitidis NOT DETECTED NOT DETECTED  ? Pseudomonas aeruginosa NOT DETECTED  NOT DETECTED  ? Stenotrophomonas maltophilia NOT DETECTED NOT DETECTED  ? Candida albicans NOT DETECTED NOT DETECTED  ? Candida auris NOT DETECTED NOT DETECTED  ? Candida glabrata NOT DETECTED NOT DETECTED  ? Candida krusei NOT DETECTED NOT DETECTED  ? Candida parapsilosis NOT DETECTED NOT DETECTED  ? Candida tropicalis NOT DETECTED NOT DETECTED  ? Cryptococcus neoformans/gattii NOT DETECTED NOT DETECTED  ? ? ?Loralee Pacas, PharmD, BCPS ?Pharmacy: [phone] ?08/18/2021  1:29 PM ? ?

## 2021-08-18 NOTE — Progress Notes (Signed)
Patient keeps yelling out help,on getting to the room, asked patient what she needs help with she stated nothing, just to  let her rest. Offered meal and bathroom assistance, patient refusing meals and getting up to the [facility] or bedpan. ?

## 2021-08-18 NOTE — TOC Initial Note (Addendum)
Transition of Care (TOC) - Initial/Assessment Note  ? ? ?Patient Details  ?Name: Carrie [name] ?MRN: [id number] ?Date of Birth: 08/22/1960 ? ?Transition of Care [facility](TOC) CM/SW Contact:    ?Carrie [name], Carrie Galentine, RN ?Phone Number: ?08/18/2021, 10:01 AM ? ?Clinical Narrative: From home alone w/[facility] HHC services;PT eval await recc.  Spoke to patient/sister Carrie [name] agree to home w/HHC-patient declines SNF. Will need PTAR.              ? ? ?Expected Discharge Plan: Home w Home Health Services ?Barriers to Discharge: Continued Medical Work up ? ? ?Patient Goals and CMS Choice ?Patient states their goals for this hospitalization and ongoing recovery are:: Home ?CMS Medicare.gov Compare Post Acute Care list provided to:: Patient ?Choice offered to / list presented to : Patient ? ?Expected Discharge Plan and Services ?Expected Discharge Plan: Home w Home Health Services ?  ?Discharge Planning Services: CM Consult ?Post Acute Care Choice: Home Health ?Living arrangements for the past 2 months: Single Family Home ?                ?  ?  ?  ?  ?  ?  ?  ?  ?  ?  ? ?Prior Living Arrangements/Services ?Living arrangements for the past 2 months: Single Family Home ?Lives with:: Self ?Patient language and need for interpreter reviewed:: Yes ?Do you feel safe going back to the place where you live?: Yes      ?Need for Family Participation in Patient Care: Yes (Comment) ?Care giver support system in place?: Yes (comment) ?Current home services: DME (rw;[facility] HHRN/PT/OT) ?Criminal Activity/Legal Involvement Pertinent to Current Situation/Hospitalization: No - Comment as needed ? ?Activities of Daily Living ?Home Assistive Devices/Equipment: Dan HumphreysWalker (specify type), Wheelchair ?ADL Screening (condition at time of admission) ?Patient's cognitive ability adequate to safely complete daily activities?: No ?Is the patient deaf or have difficulty hearing?: No ?Does the patient have difficulty seeing, even when wearing glasses/contacts?: No ?Does the patient have  difficulty concentrating, remembering, or making decisions?: Yes ?Patient able to express need for assistance with ADLs?: Yes ?Does the patient have difficulty dressing or bathing?: Yes ?Independently performs ADLs?: No ?Communication: Independent ?Dressing (OT): Needs assistance ?Is this a change from baseline?: Pre-admission baseline ?Grooming: Needs assistance ?Is this a change from baseline?: Pre-admission baseline ?Feeding: Needs assistance ?Is this a change from baseline?: Pre-admission baseline ?Bathing: Needs assistance ?Is this a change from baseline?: Pre-admission baseline ?Toileting: Needs assistance ?Is this a change from baseline?: Pre-admission baseline ?In/Out Bed: Needs assistance ?Is this a change from baseline?: Pre-admission baseline ?Walks in Home: Needs assistance ?Is this a change from baseline?: Pre-admission baseline ?Does the patient have difficulty walking or climbing stairs?: Yes ?Weakness of Legs: Both ?Weakness of Arms/Hands: Both ? ?Permission Sought/Granted ?Permission sought to share information with : Case Manager ?Permission granted to share information with : Yes, Verbal Permission Granted ? Share Information with NAME: Case manager ?   ?   ?   ? ?Emotional Assessment ?  ?  ?  ?  ?  ?  ? ?Admission diagnosis:  Lactic acidosis [E87.20] ?Abdominal pain, unspecified abdominal location [R10.9] ?Altered mental status, unspecified altered mental status type [R41.82] ?High anion gap metabolic acidosis [E87.29] ?Patient Active Problem List  ? Diagnosis Date Noted  ? High anion gap metabolic acidosis 08/18/2021  ? Lactic acidosis 08/21/2021  ? Disorder of adrenal gland, unspecified (HCC) 08/14/2021  ? Pancytopenia (HCC) 08/08/2021  ? Volume depletion 08/12/2021  ? Long-term corticosteroid use 08/09/2021  ?  Open wound of right hand 08/25/2021  ? Adrenal nodule (HCC) 08/07/2021  ? Pressure injury of skin 08/11/2021  ? Dermatomyositis (HCC) 06/23/2021  ? Fatty liver 06/23/2021  ? Inflammatory  arthritis 06/23/2021  ? Depression with anxiety 05/26/2021  ? Dyspepsia 05/26/2021  ? Hyponatremia   ? Depression   ? Confusion   ? Debility 04/08/2021  ? Rheumatoid arthritis flare (HCC) 04/08/2021  ? Fall (on) (from) other stairs and steps, subsequent encounter 04/08/2021  ? Non-traumatic rhabdomyolysis   ? Pre-syncope 03/26/2021  ? Generalized weakness 03/26/2021  ? Joint pain 03/26/2021  ? Bronchitis 01/17/2021  ? Depression, major, single episode, moderate (HCC) 08/25/2018  ? Depression, major, single episode, severe (HCC) 04/26/2017  ? Bunion 04/22/2017  ? Pruritus 08/04/2016  ? Environmental and seasonal allergies 08/04/2016  ? BACK PAIN 04/28/2010  ? OTHER ACUTE REACTIONS TO STRESS 03/26/2010  ? HOT FLASHES 01/17/2010  ? FATIGUE 02/02/2008  ? Hyperlipidemia 08/18/2007  ? Hypertension 02/25/2007  ? Chest pain, unspecified 02/25/2007  ? REDUCTION MAMMOPLASTY, HX OF 02/25/2007  ? ?PCP:  Donato Schultz, DO ?Pharmacy:   ?[facility] [address], [place] - [address] ?[address] ?[place] [address] ?Phone: [phone] Fax: [phone] ? ? ? ? ?Social Determinants of Health (SDOH) Interventions ?  ? ?Readmission Risk Interventions ?Readmission Risk Prevention Plan 08/18/2021  ?Transportation Screening Complete  ?Medication Review Oceanographer) Complete  ?HRI or Home Care Consult Complete  ?SW Recovery Care/Counseling Consult Complete  ?Palliative Care Screening Not Applicable  ?Skilled Nursing Facility Complete  ?Some recent data might be hidden  ? ? ? ?

## 2021-08-18 NOTE — Progress Notes (Signed)
Speech Language Pathology Treatment: Dysphagia  ?Patient Details ?Name: Carrie CoxLivia S  ?MRN: [id number] ?DOB: 12/16/1960 ?Today's Date: 08/18/2021 ?Time: [id number] ?SLP Time Calculation (min) (ACUTE ONLY): 10 min ? ?Assessment / Plan / Recommendation ?Clinical Impression ? Patient seen by SLP for skilled session focused on dysphagia goals. Upon SLP entering room, patient sidelying and shaking. When asked, she said she was cold. SLP brought her a couple blankets which she reported did help. She requested ginger ale to drink which SLP brought. SLP then raised HOB and patient took 2-3 straw sips before saying "thats all I want". No overt s/s aspiration or penetration with this limited amount of PO intake observed. SLP spoke with patient's nurse who reported that patient has only had a small amount of grapes today and some liquids. Patient did appear less confused today as compared to yesterday but she continues to have very little participation overall. SLP will follow briefly for diet toleration. ?  ?HPI HPI: Patient is a 61 y.o. female with PMH: depression, anxiety, HLD, HTN, nontraumatic rhabdomyolysis, presyncope, history of rheumatoid arthritis who is brought to the emergency department for the third time in the past 12 days due to altered mental status, abdominal pain, constipation for several days, decreased oral intake with dehydration. She [facility] November [facility] after fall and LOC.  She unfortunately had cognitive decline and at time of discharge home was requiring mod-maxA. During this current admission, CT abdomen/pelvis did not show any acute findings to explain patient's symptoms. CT Head was negative for acute intracranial abnormality. CXR negative for acute chest disease or interval changes and both lungs were clear. ?  ?   ?SLP Plan ? Continue with current plan of care ? ?  ?  ?Recommendations for follow up therapy are one component of a multi-disciplinary discharge  planning process, led by the attending physician.  Recommendations may be updated based on patient status, additional functional criteria and insurance authorization. ?  ? ?Recommendations  ?Diet recommendations: Regular;Thin liquid ?Liquids provided via: Cup;Straw ?Medication Administration: Whole meds with liquid ?Supervision: Full supervision/cueing for compensatory strategies;Staff to assist with self feeding ?Compensations: Slow rate;Small sips/bites ?Postural Changes and/or Swallow Maneuvers: Seated upright 90 degrees  ?   ?    ?   ? ? ? ? Oral Care Recommendations: Oral care BID;Staff/trained caregiver to provide oral care ?Follow Up Recommendations: Home health SLP ?Assistance recommended at discharge: Frequent or constant Supervision/Assistance ?SLP Visit Diagnosis: Dysphagia, unspecified (R13.10) ?Plan: Continue with current plan of care ? ? ? ? ?  ?  ? ?Angela NevinJohn T. Elza Varricchio, MA, CCC-SLP ?Speech Therapy ? ?

## 2021-08-18 NOTE — Progress Notes (Addendum)
[facility] PROGRESS NOTE    Progress Note  Carrie [name]  [id number] DOB: 10-11-1960 DOA: 09/02/2021 PCP: Ann Held, DO     Brief Narrative:   Carrie [name] is an 61 y.o. female past medical history significant for depression, anxiety dermatomyositis, presyncope history of rheumatoid arthritis on Plaquenil, mycophenolate and daily prednisone comes into the ED for the first time in the past 12 days for altered mental status abdominal pain and constipation for several days with significant decreased oral intake according to the niece prednisone was lowered to 17.5 mg a day and subsequently had to be increased to 40 mg a day she had similar symptoms in the past when she is tapered down on her steroids   Assessment/Plan:   High anion gap metabolic acidosis/Lactic acidosis: Blood pressure heart rate and saturations are stable. Lactic acid was mildly elevated on admission, she was fluid resuscitated started empirically on antibiotics. Question if due to the significant decreased oral intake. She is mildly leukopenic and thrombocytopenic. Imaging does not show any acute findings, lactic acid is clearing with current management. Signs of acute infection, blood cultures have been ordered.  Pancytopenia: Holding Plaquenil and mycophenolate.  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  However this is diagnosis of occlusion. She has not had fevers, but she does have anemia thrombocytopenia no renal dysfunction but she does have neurological dysfunction hematology review the peripheral smear that shows less than 5% schistocytes.  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 As speaking to the family they relate  that sometimes she is confused and speaks nonsense.  Acute kidney injury: Likely prerenal azotemia due to decreased oral intake she has been fluid resuscitated her creatinine has returned to baseline.  Generalized weakness: Probably secondary to above physical therapy has been consulted.  Hyperlipidemia: Noted.  Essential hypertension: Holding ACE inhibitor in the setting of acute kidney injury. Blood pressure seems to be well controlled.  Rheumatoid arthritis: Holding Plaquenil and mycophenolate, continue steroids.  Depression with anxiety: Continue Lofibra) and citalopram.  Incidental adrenal nodule: Nonemergent contrast adrenal CT protocol has been ordered.  Stage III sacral decubitus ulcer: Present on admission wound care has been consulted. RN Pressure Injury Documentation: Pressure Injury 08/06/2021 Hand Posterior;Right Stage 3 -  Full thickness tissue loss. Subcutaneous fat may be visible but bone, tendon or muscle are NOT exposed. (Active)  08/08/2021 0900  Location: Hand  Location Orientation: Posterior;Right  Staging: Stage 3 -  Full thickness tissue loss. Subcutaneous fat may be visible but bone, tendon or muscle are NOT exposed.  Wound Description (Comments):   Present on Admission: Yes     Pressure Injury 08/18/2021 Sacrum Mid Stage 3 -  Full thickness tissue loss. Subcutaneous fat may be visible but bone, tendon or muscle are NOT exposed. (Active)  09/05/2021 0900  Location: Sacrum  Location Orientation: Mid  Staging: Stage 3 -  Full thickness tissue loss. Subcutaneous fat may be visible but bone, tendon or muscle are NOT exposed.  Wound Description (Comments):   Present on Admission: Yes    Estimated body mass index is 25.24 kg/m as calculated from the following:   Height as of this encounter: 5\' 1"  (1.549 m).   Weight as of this encounter: 60.6 kg.  DVT prophylaxis: lovenox Family  Communication:Sister Status is: Observation The patient will require care  spanning > 2 midnights and should be moved to inpatient because: Pancytopenia lactic acidosis    Code Status:     Code Status Orders  (From admission, onward)           Start     Ordered   08/30/2021 0928  Full code  Continuous        08/15/2021 0927           Code Status History     Date Active Date Inactive Code Status Order ID Comments User Context   04/08/2021 1206 04/30/2021 1634 Full Code [id number]  Elizabeth Sauer Inpatient   04/08/2021 1206 04/08/2021 1206 Full Code [id number]  Elizabeth Sauer Inpatient   03/26/2021 2217 04/08/2021 1153 Full Code [id number]  Marcelyn Bruins, MD ED      Advance Directive Documentation    Flowsheet Row Most Recent Value  Type of Advance Directive Healthcare Power of Attorney  Pre-existing out of facility DNR order (yellow form or pink MOST form) --  "MOST" Form in Place? --         IV Access:   Peripheral IV   Procedures and diagnostic studies:   CT HEAD WO CONTRAST (5MM)  Result Date: 08/29/2021 CLINICAL DATA:  61 year old female with history of delirium. Lower abdominal pain and constipation. EXAM: CT HEAD WITHOUT CONTRAST TECHNIQUE: Contiguous axial images were obtained from the base of the skull through the vertex without intravenous contrast. RADIATION DOSE REDUCTION: This exam was performed according to the departmental dose-optimization program which includes automated exposure control, adjustment of the mA and/or kV according to patient size and/or use of iterative reconstruction technique. COMPARISON:  Head CT 08/06/2021. FINDINGS: Brain: Mild cerebral atrophy. Patchy and confluent areas of decreased attenuation are noted throughout the deep and periventricular white matter of the cerebral hemispheres bilaterally, compatible with chronic microvascular ischemic disease. No evidence of acute infarction, hemorrhage, hydrocephalus, extra-axial collection or mass lesion/mass effect. Vascular: No hyperdense  vessel or unexpected calcification. Skull: Normal. Negative for fracture or focal lesion. Sinuses/Orbits: No acute finding. Other: None. IMPRESSION: 1. No acute intracranial abnormalities. 2. Mild cerebral atrophy with chronic microvascular ischemic changes in the cerebral white matter, as above. Electronically Signed   By: Vinnie Langton M.D.   On: 09/04/2021 06:31   CT ABDOMEN PELVIS W CONTRAST  Result Date: 08/20/2021 CLINICAL DATA:  61 year old female with history of acute onset of nonlocalized abdominal pain. EXAM: CT ABDOMEN AND PELVIS WITH CONTRAST TECHNIQUE: Multidetector CT imaging of the abdomen and pelvis was performed using the standard protocol following bolus administration of intravenous contrast. RADIATION DOSE REDUCTION: This exam was performed according to the departmental dose-optimization program which includes automated exposure control, adjustment of the mA and/or kV according to patient size and/or use of iterative reconstruction technique. CONTRAST:  17mL OMNIPAQUE IOHEXOL 300 MG/ML  SOLN COMPARISON:  CT of the abdomen and pelvis 08/10/2021. FINDINGS: Comment: Portions of today's examination are limited by patient motion, and from artifact from the patient being imaged with the arms in the down position. Lower chest: Unremarkable. Hepatobiliary: No suspicious cystic or solid hepatic lesions. No intra or extrahepatic biliary ductal dilatation. Gallbladder is normal in appearance. Pancreas: No pancreatic mass. No pancreatic ductal dilatation. No pancreatic or peripancreatic fluid collections or inflammatory changes. Spleen: Unremarkable. Adrenals/Urinary Tract: No definite suspicious renal lesions. Right adrenal gland is normal in appearance. Well-defined 2.1 cm left adrenal lesion, similar to the prior study (  indeterminate). No hydroureteronephrosis. Urinary bladder is normal in appearance. Stomach/Bowel: The appearance of the stomach is normal. There is no pathologic dilatation of small  bowel or colon. A few scattered colonic diverticulae are noted, without surrounding inflammatory changes to suggest an acute diverticulitis at this time. Normal appendix. Vascular/Lymphatic: Atherosclerotic calcifications are noted in the pelvic vasculature. No aneurysm or dissection noted in the abdominal or pelvic vasculature. No lymphadenopathy noted in the abdomen or pelvis. Reproductive: Status post hysterectomy.  Ovaries are atrophic. Other: No significant volume of ascites.  No pneumoperitoneum. Musculoskeletal: There are no aggressive appearing lytic or blastic lesions noted in the visualized portions of the skeleton. IMPRESSION: 1. No acute findings are noted in the abdomen or pelvis to account for the patient's symptoms. 2. Colonic diverticulosis without evidence of acute diverticulitis at this time. 3. 2.1 cm left adrenal nodule, stable compared to the prior study, but indeterminate. This may represent a benign adenoma. Follow-up nonemergent adrenal protocol CT scan is recommended in the near future to provide definitive characterization. This recommendation follows ACR consensus guidelines: White Paper of the ACR Incidental Findings Committee II on Vascular Findings. J Am Coll Radiol 2013; 10:789-794. 4. Atherosclerosis. Electronically Signed   By: Vinnie Langton M.D.   On: 09/02/2021 06:37   DG Chest Port 1 View  Result Date: 09/05/2021 CLINICAL DATA:  Dehydration, abdominal pain and constipation. EXAM: PORTABLE CHEST 1 VIEW COMPARISON:  Portable chest 08/06/2021 FINDINGS: The heart size and mediastinal contours are within normal limits aside from stable aortic tortuosity. Both lungs are clear. The visualized skeletal structures are intact with moderate thoracic marginal osteophytosis. IMPRESSION: No evidence of acute chest disease or interval changes. Electronically Signed   By: Telford Nab M.D.   On: 08/10/2021 06:05     Medical Consultants:   None.   Subjective:    Carrie [name]  she relates she still feels tired abdominal pain is improved.  Objective:    Vitals:   08/18/2021 1729 08/25/2021 2059 08/18/21 0500 08/18/21 0530  BP: 107/79 109/75  118/84  Pulse: 88 84  82  Resp: 18 18  20   Temp: 97.9 F (36.6 C) 97.8 F (36.6 C)  97.8 F (36.6 C)  TempSrc: Oral   Oral  SpO2: 96% 96%  95%  Weight:   60.6 kg   Height:       SpO2: 95 %   Intake/Output Summary (Last 24 hours) at 08/18/2021 0719 Last data filed at 08/18/2021 0500 Gross per 24 hour  Intake 2186.28 ml  Output 300 ml  Net 1886.28 ml   Filed Weights   08/27/2021 0422 08/18/21 0500  Weight: 63.5 kg 60.6 kg    Exam: General exam: In no acute distress. Respiratory system: Good air movement and clear to auscultation. Cardiovascular system: S1 & S2 heard, RRR. No JVD. Gastrointestinal system: Abdomen is nondistended, soft and nontender.  Central nervous system: Alert and oriented. No focal neurological deficits. Extremities: No pedal edema. Skin: No rashes, lesions or ulcers Psychiatry: Judgement and insight appear normal. Mood & affect appropriate.    Data Reviewed:    Labs: Basic Metabolic Panel: Recent Labs  Lab 08/28/2021 0502 09/04/2021 0800 08/18/21 0603  NA 134*  --  135  K 3.7  --  3.3*  CL 107  --  109  CO2 17*  --  18*  GLUCOSE 113*  --  91  BUN 36*  --  24*  CREATININE 1.37*  --  0.90  CALCIUM 9.3  --  8.4*  MG 1.9  --   --   PHOS  --  2.8  --    GFR Estimated Creatinine Clearance: 54.1 mL/min (by C-G formula based on SCr of 0.9 mg/dL). Liver Function Tests: Recent Labs  Lab 08/06/2021 0502 08/18/21 0603  AST 44* 43*  ALT 22 21  ALKPHOS 61 50  BILITOT 1.1 0.8  PROT 5.0* 3.9*  ALBUMIN 2.7* 2.1*   Recent Labs  Lab 08/29/2021 0502  LIPASE 40   Recent Labs  Lab 08/29/2021 1149  AMMONIA 23   Coagulation profile Recent Labs  Lab 08/23/2021 0502  INR 1.1   COVID-19 Labs  No results for input(s): DDIMER, FERRITIN, LDH, CRP in the last 72 hours.  Lab Results   Component Value Date   SARSCOV2NAA NEGATIVE 08/25/2021   SARSCOV2NAA NEGATIVE 08/10/2021   [place] NEGATIVE 08/06/2021   [place] NEGATIVE 03/26/2021    CBC: Recent Labs  Lab 08/27/2021 0502 08/18/21 0603  WBC 3.7* 2.4*  HGB 10.7* 8.1*  HCT 31.5* 24.6*  MCV 89.0 89.5  PLT 91* 60*   Cardiac Enzymes: Recent Labs  Lab 08/29/2021 0859  CKTOTAL 49   BNP (last 3 results) No results for input(s): PROBNP in the last 8760 hours. CBG: Recent Labs  Lab 08/06/2021 1725 08/08/2021 2056 08/18/21 0712  GLUCAP 154* 99 80   D-Dimer: No results for input(s): DDIMER in the last 72 hours. Hgb A1c: No results for input(s): HGBA1C in the last 72 hours. Lipid Profile: No results for input(s): CHOL, HDL, LDLCALC, TRIG, CHOLHDL, LDLDIRECT in the last 72 hours. Thyroid function studies: No results for input(s): TSH, T4TOTAL, T3FREE, THYROIDAB in the last 72 hours.  Invalid input(s): FREET3 Anemia work up: Recent Labs    08/27/2021 0859  FOLATE 3.8*  RETICCTPCT 1.7   Sepsis Labs: Recent Labs  Lab 09/02/2021 0502 08/09/2021 1149 08/18/21 0603  WBC 3.7*  --  2.4*  LATICACIDVEN 3.1* 2.0*  --    Microbiology Recent Results (from the past 240 hour(s))  Resp Panel by RT-PCR (Flu A&B, Covid) Nasopharyngeal Swab     Status: None   Collection Time: 08/10/21  3:14 PM   Specimen: Nasopharyngeal Swab; Nasopharyngeal(NP) swabs in vial transport medium  Result Value Ref Range Status   SARS Coronavirus 2 by RT PCR NEGATIVE NEGATIVE Final    Comment: (NOTE) SARS-CoV-2 target nucleic acids are NOT DETECTED.  The SARS-CoV-2 RNA is generally detectable in upper respiratory specimens during the acute phase of infection. The lowest concentration of SARS-CoV-2 viral copies this assay can detect is 138 copies/mL. A negative result does not preclude SARS-Cov-2 infection and should not be used as the sole basis for treatment or other patient management decisions. A negative result may occur with   improper specimen collection/handling, submission of specimen other than nasopharyngeal swab, presence of viral mutation(s) within the areas targeted by this assay, and inadequate number of viral copies(<138 copies/mL). A negative result must be combined with clinical observations, patient history, and epidemiological information. The expected result is Negative.  Fact Sheet for Patients:  EntrepreneurPulse.com.au  Fact Sheet for Healthcare Providers:  IncredibleEmployment.be  This test is no t yet approved or cleared by the [place] FDA and  has been authorized for detection and/or diagnosis of SARS-CoV-2 by FDA under an Emergency Use Authorization (EUA). This EUA will remain  in effect (meaning this test can be used) for the duration of the COVID-19 declaration under Section 564(b)(1) of the Act, 21 U.S.C.section 360bbb-3(b)(1), unless the authorization  is terminated  or revoked sooner.       Influenza A by PCR NEGATIVE NEGATIVE Final   Influenza B by PCR NEGATIVE NEGATIVE Final    Comment: (NOTE) The Xpert Xpress SARS-CoV-2/FLU/RSV plus assay is intended as an aid in the diagnosis of influenza from Nasopharyngeal swab specimens and should not be used as a sole basis for treatment. Nasal washings and aspirates are unacceptable for Xpert Xpress SARS-CoV-2/FLU/RSV testing.  Fact Sheet for Patients: EntrepreneurPulse.com.au  Fact Sheet for Healthcare Providers: IncredibleEmployment.be  This test is not yet approved or cleared by the [place] FDA and has been authorized for detection and/or diagnosis of SARS-CoV-2 by FDA under an Emergency Use Authorization (EUA). This EUA will remain in effect (meaning this test can be used) for the duration of the COVID-19 declaration under Section 564(b)(1) of the Act, 21 U.S.C. section 360bbb-3(b)(1), unless the authorization is terminated  or revoked.  Performed at [facility], [place] [address] [place], [place] [address]   Resp Panel by RT-PCR (Flu A&B, Covid) Nasopharyngeal Swab     Status: None   Collection Time: 08/09/2021  6:50 AM   Specimen: Nasopharyngeal Swab; Nasopharyngeal(NP) swabs in vial transport medium  Result Value Ref Range Status   SARS Coronavirus 2 by RT PCR NEGATIVE NEGATIVE Final    Comment: (NOTE) SARS-CoV-2 target nucleic acids are NOT DETECTED.  The SARS-CoV-2 RNA is generally detectable in upper respiratory specimens during the acute phase of infection. The lowest concentration of SARS-CoV-2 viral copies this assay can detect is 138 copies/mL. A negative result does not preclude SARS-Cov-2 infection and should not be used as the sole basis for treatment or other patient management decisions. A negative result may occur with  improper specimen collection/handling, submission of specimen other than nasopharyngeal swab, presence of viral mutation(s) within the areas targeted by this assay, and inadequate number of viral copies(<138 copies/mL). A negative result must be combined with clinical observations, patient history, and epidemiological information. The expected result is Negative.  Fact Sheet for Patients:  EntrepreneurPulse.com.au  Fact Sheet for Healthcare Providers:  IncredibleEmployment.be  This test is no t yet approved or cleared by the [place] FDA and  has been authorized for detection and/or diagnosis of SARS-CoV-2 by FDA under an Emergency Use Authorization (EUA). This EUA will remain  in effect (meaning this test can be used) for the duration of the COVID-19 declaration under Section 564(b)(1) of the Act, 21 U.S.C.section 360bbb-3(b)(1), unless the authorization is terminated  or revoked sooner.       Influenza A by PCR NEGATIVE NEGATIVE Final   Influenza B by PCR NEGATIVE NEGATIVE Final    Comment: (NOTE) The  Xpert Xpress SARS-CoV-2/FLU/RSV plus assay is intended as an aid in the diagnosis of influenza from Nasopharyngeal swab specimens and should not be used as a sole basis for treatment. Nasal washings and aspirates are unacceptable for Xpert Xpress SARS-CoV-2/FLU/RSV testing.  Fact Sheet for Patients: EntrepreneurPulse.com.au  Fact Sheet for Healthcare Providers: IncredibleEmployment.be  This test is not yet approved or cleared by the [place] FDA and has been authorized for detection and/or diagnosis of SARS-CoV-2 by FDA under an Emergency Use Authorization (EUA). This EUA will remain in effect (meaning this test can be used) for the duration of the COVID-19 declaration under Section 564(b)(1) of the Act, 21 U.S.C. section 360bbb-3(b)(1), unless the authorization is terminated or revoked.  Performed at [facility], [place] [address] [place], [place] [address]  Medications:    ARIPiprazole  5 mg Oral QHS   buPROPion  300 mg Oral Daily   escitalopram  20 mg Oral Daily   folic acid  1 mg Oral Daily   pantoprazole  40 mg Oral Daily   predniSONE  40 mg Oral Q breakfast   senna-docusate  1 tablet Oral BID   vitamin B-12  1,000 mcg Oral Daily   Continuous Infusions:  lactated ringers 100 mL/hr at 08/18/21 0400      LOS: 0 days   Charlynne Cousins  [facility]  08/18/2021, 7:19 AM

## 2021-08-19 ENCOUNTER — Encounter (HOSPITAL_COMMUNITY): Payer: Self-pay | Admitting: Internal Medicine

## 2021-08-19 DIAGNOSIS — E872 Acidosis, unspecified: Secondary | ICD-10-CM | POA: Diagnosis not present

## 2021-08-19 DIAGNOSIS — R531 Weakness: Secondary | ICD-10-CM | POA: Diagnosis not present

## 2021-08-19 DIAGNOSIS — E8729 Other acidosis: Secondary | ICD-10-CM | POA: Diagnosis not present

## 2021-08-19 DIAGNOSIS — Z7952 Long term (current) use of systemic steroids: Secondary | ICD-10-CM

## 2021-08-19 DIAGNOSIS — I10 Essential (primary) hypertension: Secondary | ICD-10-CM | POA: Diagnosis not present

## 2021-08-19 DIAGNOSIS — R109 Unspecified abdominal pain: Secondary | ICD-10-CM

## 2021-08-19 LAB — BASIC METABOLIC PANEL
Anion gap: 6 (ref 5–15)
BUN: 17 mg/dL (ref 8–23)
CO2: 18 mmol/L — ABNORMAL LOW (ref 22–32)
Calcium: 8.4 mg/dL — ABNORMAL LOW (ref 8.9–10.3)
Chloride: 108 mmol/L (ref 98–111)
Creatinine, Ser: 0.85 mg/dL (ref 0.44–1.00)
GFR, Estimated: >60 mL/min (ref >60)
Glucose, Bld: 75 mg/dL (ref 70–99)
Potassium: 3.9 mmol/L (ref 3.5–5.1)
Sodium: 132 mmol/L — ABNORMAL LOW (ref 135–145)

## 2021-08-19 LAB — CBC WITH DIFFERENTIAL/PLATELET
Abs Immature Granulocytes: 0 10*3/uL (ref 0.00–0.07)
Band Neutrophils: 9 %
Basophils Absolute: 0 10*3/uL (ref 0.0–0.1)
Basophils Relative: 0 %
Eosinophils Absolute: 0 10*3/uL (ref 0.0–0.5)
Eosinophils Relative: 0 %
HCT: 26 % — ABNORMAL LOW (ref 36.0–46.0)
Hemoglobin: 8.8 g/dL — ABNORMAL LOW (ref 12.0–15.0)
Lymphocytes Relative: 11 %
Lymphs Abs: 0.2 10*3/uL — ABNORMAL LOW (ref 0.7–4.0)
MCH: 30 pg (ref 26.0–34.0)
MCHC: 33.8 g/dL (ref 30.0–36.0)
MCV: 88.7 fL (ref 80.0–100.0)
Monocytes Absolute: 0.1 10*3/uL (ref 0.1–1.0)
Monocytes Relative: 5 %
Myelocytes: 2 %
Neutro Abs: 1.8 10*3/uL (ref 1.7–7.7)
Neutrophils Relative %: 73 %
Platelets: 58 10*3/uL — ABNORMAL LOW (ref 150–400)
RBC: 2.93 MIL/uL — ABNORMAL LOW (ref 3.87–5.11)
RDW: 16.9 % — ABNORMAL HIGH (ref 11.5–15.5)
WBC: 2.2 10*3/uL — ABNORMAL LOW (ref 4.0–10.5)
nRBC: 0.9 % — ABNORMAL HIGH (ref 0.0–0.2)

## 2021-08-19 LAB — VITAMIN B12: Vitamin B-12: 2220 pg/mL — ABNORMAL HIGH (ref 180–914)

## 2021-08-19 LAB — LACTATE DEHYDROGENASE: LDH: 488 U/L — ABNORMAL HIGH (ref 98–192)

## 2021-08-19 LAB — GLUCOSE, CAPILLARY
Glucose-Capillary: 73 mg/dL (ref 70–99)
Glucose-Capillary: 103 mg/dL — ABNORMAL HIGH (ref 70–99)
Glucose-Capillary: 86 mg/dL (ref 70–99)
Glucose-Capillary: 86 mg/dL (ref 70–99)

## 2021-08-19 LAB — CK: Total CK: 45 U/L (ref 38–234)

## 2021-08-19 MED ORDER — PREDNISONE 5 MG PO TABS
10.0000 mg | ORAL_TABLET | Freq: Every day | ORAL | Status: DC
Start: 2021-08-20 — End: 2021-08-20
  Filled 2021-08-19: qty 2

## 2021-08-19 NOTE — Discharge Planning (Addendum)
Oncology Discharge Planning Admission Note ? ?[facility] ?Address: [address] [place], [place], [place] [address] ?Hours of Operation:  8am - 5pm, Monday - Friday  ?Clinic Contact Information:  [phone] ? ?Oncology Care Team: ?Medical Oncologist:  Dr. Malachy Mood ? ?Jenell Milliner - NP is aware of this hospital admission dated 08/18/21 and has assessed patient at bedside along with Dr Mosetta Putt.  The cancer center will follow Clyda Hurdle Sachse?s inpatient care to assist with discharge planning as indicated by the oncologist.  We will arrange for outpatient follow up closer to discharge. ? ? ?Disclaimer:  This Cancer Center nursing note does not imply a formal consult request has been made by the admitting attending for this admission or there will be an inpatient consult completed by oncology.  Please request oncology consults as per standard process as indicated. ?
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
## 2021-08-19 NOTE — Consult Note (Signed)
? ?Chief Complaint: ?Patient was seen in consultation today for BMBx ?Chief Complaint  ?Patient presents with  ? Abdominal Pain  ? at the request of Mikey Bussing  ? ?Referring Physician(s): Mikey Bussing  ? ?Supervising Physician: Sandi Mariscal ? ?Patient Status: [facility] - In-pt ? ?History of Present Illness: ?Carrie [name] is a 61 y.o. female with PMHs of depression, anxiety dermatomyositis, presyncope history of rheumatoid arthritis on Plaquenil, mycophenolate and daily prednisone comes into the ED on 3/12 for the first time in the past 12 days for altered mental status abdominal pain and constipation for several days with significant decreased oral intake.  ?Labs revealed pancytopenia and hem/onc was consulted. Serologic workup has not revealed etiology of the pancytopenia, therefore a bone marrow biopsy was recommended to the patient and her family members for further evaluation. After thorough discussion and shared decision making, patient and her family members decided to proceed with the biopsy.  ? ?IR was requested for image guided BMBx.  ? ?Patient is sitting in a chair eating lunch, not in acute distress.  ?Denise headache, fever, chills, shortness of breath, cough, chest pain, abdominal pain, nausea ,vomiting, and bleeding. ?Patient is able to say that she is at [facility], but not able to say the date.  ? ? ?Past Medical History:  ?Diagnosis Date  ? Depression with anxiety 05/26/2021  ? Dermatomyositis ([place]) 06/23/2021  ? Fatty liver 06/23/2021  ? Hyperlipidemia   ? Hypertension   ? Non-traumatic rhabdomyolysis   ? Pre-syncope 03/26/2021  ? Rheumatoid arthritis flare ([place]) 04/08/2021  ? ? ?Past Surgical History:  ?Procedure Laterality Date  ? ABDOMINAL HYSTERECTOMY    ? REDUCTION MAMMAPLASTY    ? ? ?Allergies: ?Zoloft [sertraline hcl] ? ?Medications: ?Prior to Admission medications   ?Medication Sig Start Date End Date Taking? Authorizing Provider  ?acetaminophen (TYLENOL) 325 MG tablet Take 2  tablets (650 mg total) by mouth every 6 (six) hours as needed for mild pain (or Fever >/= 101). 04/29/21  Yes Angiulli, Lavon Paganini, PA-C  ?ARIPiprazole (ABILIFY) 5 MG tablet Take 5 mg by mouth at bedtime.   Yes [provider]  ?bisacodyl (DULCOLAX) 5 MG EC tablet Take 1 tablet (5 mg total) by mouth daily as needed for up to 10 doses for moderate constipation. 08/10/21  Yes Wyvonnia Dusky, MD  ?buPROPion (WELLBUTRIN XL) 300 MG 24 hr tablet Take 1 tablet (300 mg total) by mouth daily. ?Patient taking differently: Take 300 mg by mouth every morning. 05/26/21  Yes Ann Held, DO  ?Calcium Carb-Cholecalciferol (CALCIUM+D3 PO) Take 1 tablet by mouth every morning. Calcium 1200 mg, D3 200 units   Yes [provider]  ?docusate sodium (COLACE) 100 MG capsule Take 100 mg by mouth every evening.   Yes [provider]  ?escitalopram (LEXAPRO) 20 MG tablet Take 1 tablet (20 mg total) by mouth daily. ?Patient taking differently: Take 20 mg by mouth every morning. 05/26/21  Yes Roma Schanz R, DO  ?fluticasone (FLONASE) 50 MCG/ACT nasal spray Place 2 sprays into both nostrils daily. ?Patient taking differently: Place 2 sprays into both nostrils daily as needed for allergies. 12/24/20  Yes Roma Schanz R, DO  ?hydroxychloroquine (PLAQUENIL) 200 MG tablet Take 400 mg by mouth every morning.   Yes [provider]  ?lisinopril (ZESTRIL) 20 MG tablet Take 1 tablet (20 mg total) by mouth daily. ?Patient taking differently: Take 20 mg by mouth every morning. 05/26/21  Yes Roma Schanz  R, DO  ?loratadine (CLARITIN) 10 MG tablet Take 1 tablet (10 mg total) by mouth daily. ?Patient taking differently: Take 10 mg by mouth daily as needed for allergies or rhinitis. 12/24/20  Yes Roma Schanz R, DO  ?mycophenolate (CELLCEPT) 500 MG tablet Take 1,000 mg by mouth 2 (two) times daily.   Yes [provider]  ?pantoprazole (PROTONIX) 40 MG tablet Take 1 tablet (40 mg  total) by mouth daily. ?Patient taking differently: Take 40 mg by mouth every morning. 05/26/21  Yes Roma Schanz R, DO  ?predniSONE (DELTASONE) 10 MG tablet Take 40 mg by mouth every morning. 06/20/21  Yes [provider]  ?traMADol (ULTRAM) 50 MG tablet Take 1 tablet (50 mg total) by mouth every 12 (twelve) hours as needed for up to 10 doses for severe pain. 08/10/21  Yes Trifan, Carola Rhine, MD  ?Vitamin D3 (VITAMIN D) 25 MCG tablet Take 1 tablet (1,000 Units total) by mouth daily. ?Patient taking differently: Take 1,000 Units by mouth every morning. 05/26/21  Yes Ann Held, DO  ?bisacodyl (DULCOLAX) 10 MG suppository Place 1 suppository (10 mg total) rectally daily as needed for up to 10 doses for moderate constipation. ?Patient not taking: Reported on 08/14/2021 08/10/21   Wyvonnia Dusky, MD  ?ondansetron (ZOFRAN) 4 MG tablet Take 4 mg by mouth daily. ?Patient not taking: Reported on 08/08/2021 08/13/21   [provider]  ?senna-docusate (SENOKOT-S) 8.6-50 MG tablet Take 1 tablet by mouth 2 (two) times daily. ?Patient not taking: Reported on 08/16/2021 04/29/21   Cathlyn Parsons, PA-C  ?sulfamethoxazole-trimethoprim (BACTRIM DS) 800-160 MG tablet Take 1 tablet by mouth every Monday, Wednesday, and Friday. ?Patient not taking: Reported on 08/23/2021 07/17/21   [provider]  ?  ? ?Family History  ?Problem Relation Age of Onset  ? Breast cancer Mother   ?     deceased by 12  ? Breast cancer Sister 45  ? Diabetes Sister   ? Breast cancer Other   ? Coronary artery disease Other   ? Hypertension Other   ? ? ?Social History  ? ?Socioeconomic History  ? Marital status: Single  ?  Spouse name: Not on file  ? Number of children: Not on file  ? Years of education: Not on file  ? Highest education level: Not on file  ?Occupational History  ? Not on file  ?Tobacco Use  ? Smoking status: Former  ?  Types: Cigarettes  ?  Quit date: 02/05/2014  ?  Years since quitting: 7.5  ? Smokeless  tobacco: Never  ? Tobacco comments:  ?  1 cig every 4-5 days ---weaning off  ?Vaping Use  ? Vaping Use: Unknown  ?Substance and Sexual Activity  ? Alcohol use: Not Currently  ? Drug use: No  ? Sexual activity: Not Currently  ?  Partners: Male  ?Other Topics Concern  ? Not on file  ?Social History Narrative  ? Exercise --rare  ? ?Social Determinants of Health  ? ?Financial Resource Strain: Not on file  ?Food Insecurity: Not on file  ?Transportation Needs: Not on file  ?Physical Activity: Not on file  ?Stress: Not on file  ?Social Connections: Not on file  ? ? ? ?Review of Systems: A 12 point ROS discussed and pertinent positives are indicated in the HPI above.  All other systems are negative. ? ?Vital Signs: ?BP (!) 107/93 (BP Location: Left Arm)   Pulse (!) 103   Temp 98 ?F (  36.7 ?C) (Oral)   Resp (!) 24   Ht $R'5\' 1"'Kr$  (1.549 m)   Wt 133 lb 9.6 oz (60.6 kg)   SpO2 100%   BMI 25.24 kg/m?  ? ? ?Physical Exam ?Vitals reviewed.  ?Constitutional:   ?   General: She is not in acute distress. ?   Appearance: She is well-developed. She is not ill-appearing.  ?HENT:  ?   Head: Normocephalic and atraumatic.  ?Cardiovascular:  ?   Rate and Rhythm: Normal rate and regular rhythm.  ?Pulmonary:  ?   Effort: Pulmonary effort is normal.  ?   Breath sounds: Wheezing present.  ?   Comments: Expiratory wheezing on LLL ?Abdominal:  ?   General: Abdomen is flat. Bowel sounds are normal.  ?   Palpations: Abdomen is soft.  ?Skin: ?   General: Skin is warm and dry.  ?Neurological:  ?   Mental Status: She is alert.  ?   Comments: Oriented to self and place only   ?Psychiatric:     ?   Mood and Affect: Mood normal.     ?   Behavior: Behavior normal.  ? ? ?MD Evaluation ?Airway: WNL ?Heart: WNL ?Abdomen: WNL ?Chest/ Lungs: WNL ?ASA  Classification: 3 ?Mallampati/Airway Score: Two ? ?Imaging: ?DG Chest 1 View ? ?Result Date: 08/06/2021 ?CLINICAL DATA:  Dizziness, nausea and vomiting, bilateral leg pain EXAM: CHEST  1 VIEW COMPARISON:   04/16/2021 FINDINGS: Single frontal view of the chest demonstrates an unremarkable cardiac silhouette. No acute airspace disease, effusion, or pneumothorax. No acute bony abnormalities. IMPRESSION: 1. No acute intr

## 2021-08-19 NOTE — Progress Notes (Signed)
Chaplain tried to engage in an initial visit with Nyara but she was sleeping upon Chaplain's arrival. Nurse tech and student nurse shared that Sarang has done better when people are able to sit and talk with her.  Nurse shared that it would be good for Alynna to have a Chaplain visit as she has been confused and fearful about being in the hospital.   ? ?Chaplain will follow-up and offer support. Chaplain is also recommending for a Scientist, water quality to spend time with [place].   ? ? ? 08/19/21 1500  ?Clinical Encounter Type  ?Visited With Patient not available;Health care provider  ?Visit Type Initial  ?Referral From Nurse  ?Consult/Referral To Nurse  ?Scientist, water quality  ?Scientist, water quality  Recommended  ?Hours needed 2 hours  ? ? ?

## 2021-08-19 NOTE — Progress Notes (Signed)
Patient continue to refuse with participation  of care. Not eating, not getting out of bed or dressing change etc. Needing lots of encouragement and extra time. Was able to get her out of bed but she asked to get back in bed right away.  ?

## 2021-08-19 NOTE — Progress Notes (Addendum)
[facility] ?PROGRESS NOTE ? ? ? ?Progress Note  ?Carrie [name]  ZOX:[id number] DOB: 1960/11/10 DOA: 08/09/2021 ?PCP: Ann Held, DO  ? ? ? ?Brief Narrative:  ? ?Carrie [name] is an 61 y.o. female past medical history significant for depression, anxiety dermatomyositis, presyncope history of rheumatoid arthritis on Plaquenil, mycophenolate and daily prednisone comes into the ED for the first time in the past 12 days for altered mental status abdominal pain and constipation for several days with significant decreased oral intake. according to the niece prednisone was lowered to 17.5 mg a day and subsequently had to be increased to 40 mg a day she had similar symptoms in the past when she is tapered down on her steroids ? ? ?Assessment/Plan:  ? ?High anion gap metabolic acidosis/Lactic acidosis: ?-Blood pressure heart rate and saturations are stable. ?-Lactic acid was mildly elevated on admission, she was fluid resuscitated started empirically on IV Rocephin. ?-Patient metabolic panel is pending, she is mildly leukopenic and thrombocytopenic. ?-Imaging does not show any acute findings, lactic acid is cleared with current management. ?- No signs of infection, culture data has remained negative till date, afebrile, with an ESR of 4,  we will discontinue empiric antibiotics. ?- She continues to to complain of abdominal pain physical exam is unremarkable CT scan of the abdomen and pelvis unremarkable and she is eating all her food. ? ?Pancytopenia: ?- Main concern is leukemia. ?- Holding Plaquenil and mycophenolate.  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   ?- Further  management per hematology. ?- ESR was 4 and unlikely an inflammatory disorder,Adamts-13: 87. ?- She needs to be out of bed to chair. ?- Physical therapy saw her and recommended home health PT. ?- Patient lacking motivation to get out of bed to chair and ambulating. ? ?Acute metabolic encephalopathy: ?- MRI of the brain showed no acute finding, with cerebral volume loss without specific patterns no changes since November 2022. ? ?Acute kidney injury: ?- Likely prerenal azotemia due to decreased oral intake, resolved with fluid resuscitation. ? ?Hypokalemia: ?Replete orally now resolved. ? ?Generalized weakness: ?- Probably secondary to above physical therapy has been consulted. ?- Nerve conduction study as an outpatient on 08/14/2021 showed myopathic disorder, could be possibly due to steroids, drug-induced myopathy,  polymyositis or dermatomyositis unlike latter as her ESR is 4 or likely due to above.Marland Kitchen  ?Will start decreasing steorids.  ? ?Hyperlipidemia: ?- Noted. ? ?Essential hypertension: ?- Holding ACE inhibitor in the setting of acute kidney injury. ?- Blood pressure seems to be well controlled. ? ?Rheumatoid arthritis: ?- Holding Plaquenil and mycophenolate, continue steroids. ? ?Depression with anxiety: ?- Continue Wellbutrin and citalopram. ? ?Incidental adrenal nodule: ?Nonemergent contrast adrenal CT protocol has been ordered. ? ?Stage III sacral decubitus ulcer: ?Present on admission wound care has been consulted. ?RN Pressure Injury Documentation: ?Pressure Injury 08/15/2021 Hand Posterior;Right Stage 3 -  Full thickness tissue loss. Subcutaneous fat may be visible but bone, tendon or muscle are NOT exposed. (Active)  ?08/26/2021 0900  ?Location: Hand  ?Location Orientation: Posterior;Right  ?Staging: Stage 3 -  Full thickness tissue loss. Subcutaneous fat may be visible but bone, tendon or muscle are NOT exposed.  ?  Wound Description (Comments):   ?Present on Admission: Yes  ?   ?Pressure Injury 08/20/2021  Sacrum Mid Stage 3 -  Full thickness tissue loss. Subcutaneous fat may be visible but bone, tendon or muscle are NOT exposed. (Active)  ?08/18/2021 0900  ?Location: Sacrum  ?Location Orientation: Mid  ?Staging: Stage 3 -  Full thickness tissue loss. Subcutaneous fat may be visible but bone, tendon or muscle are NOT exposed.  ?Wound Description (Comments):   ?Present on Admission: Yes  ? ? ?Estimated body mass index is 25.24 kg/m? as calculated from the following: ?  Height as of this encounter: $RemoveBeforeD'5\' 1"'jZNbyqpjlihqcj$  (1.549 m). ?  Weight as of this encounter: 60.6 kg. ? ?DVT prophylaxis: lovenox ?Family Communication:Sister ?Status is: Observation ?The patient will require care spanning > 2 midnights and should be moved to inpatient because: Pancytopenia lactic acidosis ? ? ? ?Code Status:  ? ?  ?Code Status Orders  ?(From admission, onward)  ?  ? ? ?  ? ?  Start     Ordered  ? 08/31/2021 0928  Full code  Continuous       ? 08/29/2021 0927  ? ?  ?  ? ?  ? ?Code Status History   ? ? Date Active Date Inactive Code Status Order ID Comments User Context  ? 04/08/2021 1206 04/30/2021 1634 Full Code [id number]  Cathlyn Parsons, PA-C Inpatient  ? 04/08/2021 1206 04/08/2021 1206 Full Code [id number]  Cathlyn Parsons, PA-C Inpatient  ? 03/26/2021 2217 04/08/2021 1153 Full Code [id number]  Marcelyn Bruins, MD ED  ? ?  ? ?Advance Directive Documentation   ? ?Flowsheet Row Most Recent Value  ?Type of Advance Directive Healthcare Power of Attorney  ?Pre-existing out of facility DNR order (yellow form or pink MOST form) --  ?"MOST" Form in Place? --  ? ?  ? ? ? ? ?IV Access:  ? ?Peripheral IV ? ? ?Procedures and diagnostic studies:  ? ?MR BRAIN WO CONTRAST ? ?Result Date: 08/18/2021 ?CLINICAL DATA:  Delirium.  Unable to follow commands EXAM: MRI HEAD WITHOUT CONTRAST TECHNIQUE: Multiplanar, multiecho pulse sequences of the brain and surrounding structures were obtained without intravenous contrast. COMPARISON:  Head CT from yesterday 04/17/2021 brain  MRI FINDINGS: Brain: No acute infarction, hemorrhage, hydrocephalus, extra-axial collection or mass lesion. Cerebral volume loss without specific pattern. Vascular: Normal flow voids. Skull and upper cervical spine: Normal marrow signal. Sinuses/Orbits: Bilateral cataract resection. Minor mastoid opacification on the left more than right. Negative nasopharynx. Other: Motion degraded study requiring fast brain protocol. IMPRESSION: Motion degraded brain MRI without acute finding. No change since November 2022. Electronically Signed   By: Jorje Guild M.D.   On: 08/18/2021 11:46   ? ? ?Medical Consultants:  ? ?None. ? ? ?Subjective:  ? ? ?Carrie [name] know she feels tired complaining of abdominal pain but tolerating all her diet. ? ?Objective:  ? ? ?Vitals:  ? 08/18/21 0530 08/18/21 1156 08/18/21 2044 08/19/21 0610  ?BP: 118/84 (!) 117/91 122/84 116/88  ?Pulse: 82 82 95 86  ?Resp: $Remov'20 19 20 20  'quHgUx$ ?Temp: 97.8 ?F (36.6 ?C) 97.7 ?F (36.5 ?C) 98 ?F (36.7 ?C) 97.9 ?F (36.6 ?C)  ?TempSrc: Oral Oral    ?SpO2: 95% 99% 95% 96%  ?Weight:      ?Height:      ? ?SpO2: 96 % ? ? ?Intake/Output Summary (Last 24 hours) at 08/19/2021 0750 ?Last data filed at 08/19/2021 0525 ?Gross per 24 hour  ?Intake  2861.67 ml  ?Output 250 ml  ?Net 2611.67 ml  ? ? ?Filed Weights  ? 08/18/2021 0422 08/18/21 0500  ?Weight: 63.5 kg 60.6 kg  ? ? ?Exam: ?General exam: In no acute distress. ?Respiratory system: Good air movement and clear to auscultation. ?Cardiovascular system: S1 & S2 heard, RRR. No JVD. ?Gastrointestinal system: Abdomen is nondistended, soft and nontender.  ?Extremities: No pedal edema. ?Skin: No rashes, lesions or ulcers ?Psychiatry: Judgement and insight appear normal. Mood & affect appropriate. ? ?Data Reviewed:  ? ? ?Labs: ?Basic Metabolic Panel: ?Recent Labs  ?Lab 08/31/2021 ?0502 08/10/2021 ?0800 08/18/21 ?0603  ?NA 134*  --  135  ?K 3.7  --  3.3*  ?CL 107  --  109  ?CO2 17*  --  18*  ?GLUCOSE 113*  --  91  ?BUN 36*  --  24*   ?CREATININE 1.37*  --  0.90  ?CALCIUM 9.3  --  8.4*  ?MG 1.9  --   --   ?PHOS  --  2.8  --   ? ? ?GFR ?Estimated Creatinine Clearance: 54.1 mL/min (by C-G formula based on SCr of 0.9 mg/dL). ?Liver Function Tests: ?

## 2021-08-19 NOTE — Progress Notes (Addendum)
HEMATOLOGY-ONCOLOGY PROGRESS NOTE ? ?ASSESSMENT AND PLAN: ?61 yo female  ?  ?AMS, ?  Metabolic encephalopathy ?Thrombocytopenia and normocytic anemia  ?AKI and hyponatremia secondary to dehydration  ?Hypertension ?Rheumatoid arthritis ?Depression and anxiety ?Stage III sacral ulcer ?Folate deficiency  ?  ?Recommendations: ?-She has been started on folic acid for folate deficiency.  However, pancytopenia has slowly worsened. ?-Total bilirubin and reticulocyte count was normal, no lab evidence of hemolysis.  ADAMTS 13 was normal at 87.8. ?-normal spleen on recent CT. ?-Pancytopenia initially thought to be related to her history of RA that she may have a component of immune-mediated thrombocytopenia.  There was concern for ITP but immature platelet fraction was normal. ?-Given worsening of her pancytopenia, I discussed proceeding with a bone marrow biopsy by IR.  I also contacted the patient's niece with her permission to discuss our recommendation for bone marrow biopsy.  Both the patient and her niece agreed to the procedure.  Orders entered for bone marrow biopsy.  Discussed our recommendations with primary team. ?-Monitor CBC closely.  We will follow-up on the bone marrow biopsy results and make additional recommendations based on the findings. ? ?Mikey Bussing, DNP, AGPCNP-BC, AOCNP ? ?SUBJECTIVE: The patient is sitting up in recliner today.  She offers no specific complaints.  She denies bleeding.  She is not having any fevers or chills.  No family at the bedside. ? ?REVIEW OF SYSTEMS:   ?Review of Systems  ?Constitutional:  Negative for chills and fever.  ?HENT: Negative.    ?Respiratory: Negative.    ?Cardiovascular: Negative.   ?Skin: Negative.   ?Neurological: Negative.   ?Endo/Heme/Allergies:  Does not bruise/bleed easily.  ? ?I have reviewed the past medical history, past surgical history, social history and family history with the patient and they are unchanged from previous note. ? ? ?PHYSICAL  EXAMINATION: ?ECOG PERFORMANCE STATUS: 3 - Symptomatic, >50% confined to bed ? ?Vitals:  ? 08/19/21 1128 08/19/21 1253  ?BP: (!) 107/93 114/82  ?Pulse: (!) 103 98  ?Resp: (!) 24 (!) 24  ?Temp:  97.7 ?F (36.5 ?C)  ?SpO2: 100% 100%  ? ?Filed Weights  ? 08/12/2021 0422 08/18/21 0500  ?Weight: 63.5 kg 60.6 kg  ? ? ?Intake/Output from previous day: ?03/13 0701 - 03/14 0700 ?In: 2861.7 [P.O.:320; I.V.:2541.7] ?Out: 250 [Urine:250] ? ?Physical Exam ?Vitals reviewed.  ?Constitutional:   ?   General: She is not in acute distress. ?HENT:  ?   Head: Normocephalic.  ?   Mouth/Throat:  ?   Pharynx: No oropharyngeal exudate or posterior oropharyngeal erythema.  ?Eyes:  ?   General: No scleral icterus. ?Cardiovascular:  ?   Rate and Rhythm: Normal rate and regular rhythm.  ?Pulmonary:  ?   Effort: Pulmonary effort is normal. No respiratory distress.  ?Abdominal:  ?   General: There is no distension.  ?   Palpations: Abdomen is soft.  ?Skin: ?   General: Skin is warm and dry.  ?Neurological:  ?   Mental Status: She is alert.  ?   Comments: Oriented to person and place.  She thinks it is 64.  ? ? ?LABORATORY DATA:  ?I have reviewed the data as listed ?CMP Latest Ref Rng & Units 08/19/2021 08/18/2021 09/05/2021  ?Glucose 70 - 99 mg/dL 75 91 113(H)  ?BUN 8 - 23 mg/dL 17 24(H) 36(H)  ?Creatinine 0.44 - 1.00 mg/dL 0.85 0.90 1.37(H)  ?Sodium 135 - 145 mmol/L 132(L) 135 134(L)  ?Potassium 3.5 - 5.1 mmol/L 3.9 3.3(L) 3.7  ?  Chloride 98 - 111 mmol/L 108 109 107  ?CO2 22 - 32 mmol/L 18(L) 18(L) 17(L)  ?Calcium 8.9 - 10.3 mg/dL 8.4(L) 8.4(L) 9.3  ?Total Protein 6.5 - 8.1 g/dL - 3.9(L) 5.0(L)  ?Total Bilirubin 0.3 - 1.2 mg/dL - 0.8 1.1  ?Alkaline Phos 38 - 126 U/L - 50 61  ?AST 15 - 41 U/L - 43(H) 44(H)  ?ALT 0 - 44 U/L - 21 22  ? ? ?Lab Results  ?Component Value Date  ? WBC 2.2 (L) 08/19/2021  ? HGB 8.8 (L) 08/19/2021  ? HCT 26.0 (L) 08/19/2021  ? MCV 88.7 08/19/2021  ? PLT 58 (L) 08/19/2021  ? NEUTROABS 1.8 08/19/2021  ? ? ?No results found for:  CEA1, CEA, [id number], CA125, PSA1 ? ?DG Chest 1 View ? ?Result Date: 08/06/2021 ?CLINICAL DATA:  Dizziness, nausea and vomiting, bilateral leg pain EXAM: CHEST  1 VIEW COMPARISON:  04/16/2021 FINDINGS: Single frontal view of the chest demonstrates an unremarkable cardiac silhouette. No acute airspace disease, effusion, or pneumothorax. No acute bony abnormalities. IMPRESSION: 1. No acute intrathoracic process. Electronically Signed   By: Randa Ngo M.D.   On: 08/06/2021 19:41  ? ?CT HEAD WO CONTRAST (5MM) ? ?Result Date: 08/11/2021 ?CLINICAL DATA:  61 year old female with history of delirium. Lower abdominal pain and constipation. EXAM: CT HEAD WITHOUT CONTRAST TECHNIQUE: Contiguous axial images were obtained from the base of the skull through the vertex without intravenous contrast. RADIATION DOSE REDUCTION: This exam was performed according to the departmental dose-optimization program which includes automated exposure control, adjustment of the mA and/or kV according to patient size and/or use of iterative reconstruction technique. COMPARISON:  Head CT 08/06/2021. FINDINGS: Brain: Mild cerebral atrophy. Patchy and confluent areas of decreased attenuation are noted throughout the deep and periventricular white matter of the cerebral hemispheres bilaterally, compatible with chronic microvascular ischemic disease. No evidence of acute infarction, hemorrhage, hydrocephalus, extra-axial collection or mass lesion/mass effect. Vascular: No hyperdense vessel or unexpected calcification. Skull: Normal. Negative for fracture or focal lesion. Sinuses/Orbits: No acute finding. Other: None. IMPRESSION: 1. No acute intracranial abnormalities. 2. Mild cerebral atrophy with chronic microvascular ischemic changes in the cerebral white matter, as above. Electronically Signed   By: Vinnie Langton M.D.   On: 08/16/2021 06:31  ? ?CT Head Wo Contrast ? ?Result Date: 08/06/2021 ?CLINICAL DATA:  Dizziness, nausea and vomiting EXAM: CT  HEAD WITHOUT CONTRAST TECHNIQUE: Contiguous axial images were obtained from the base of the skull through the vertex without intravenous contrast. RADIATION DOSE REDUCTION: This exam was performed according to the departmental dose-optimization program which includes automated exposure control, adjustment of the mA and/or kV according to patient size and/or use of iterative reconstruction technique. COMPARISON:  03/26/2021, 04/17/2021 FINDINGS: Brain: No acute infarct or hemorrhage. Lateral ventricles and midline structures are stable. No acute extra-axial fluid collections. No mass effect. Vascular: No hyperdense vessel or unexpected calcification. Skull: Normal. Negative for fracture or focal lesion. Sinuses/Orbits: No acute finding. Other: None. IMPRESSION: 1. No acute intracranial process. Electronically Signed   By: Randa Ngo M.D.   On: 08/06/2021 19:40  ? ?MR BRAIN WO CONTRAST ? ?Result Date: 08/18/2021 ?CLINICAL DATA:  Delirium.  Unable to follow commands EXAM: MRI HEAD WITHOUT CONTRAST TECHNIQUE: Multiplanar, multiecho pulse sequences of the brain and surrounding structures were obtained without intravenous contrast. COMPARISON:  Head CT from yesterday 04/17/2021 brain MRI FINDINGS: Brain: No acute infarction, hemorrhage, hydrocephalus, extra-axial collection or mass lesion. Cerebral volume loss without specific pattern. Vascular: Normal  flow voids. Skull and upper cervical spine: Normal marrow signal. Sinuses/Orbits: Bilateral cataract resection. Minor mastoid opacification on the left more than right. Negative nasopharynx. Other: Motion degraded study requiring fast brain protocol. IMPRESSION: Motion degraded brain MRI without acute finding. No change since November 2022. Electronically Signed   By: Jorje Guild M.D.   On: 08/18/2021 11:46  ? ?CT ABDOMEN PELVIS W CONTRAST ? ?Result Date: 09/01/2021 ?CLINICAL DATA:  61 year old female with history of acute onset of nonlocalized abdominal pain. EXAM:  CT ABDOMEN AND PELVIS WITH CONTRAST TECHNIQUE: Multidetector CT imaging of the abdomen and pelvis was performed using the standard protocol following bolus administration of intravenous contrast. RADIATION DOSE RE

## 2021-08-20 ENCOUNTER — Inpatient Hospital Stay (HOSPITAL_COMMUNITY): Payer: Federal, State, Local not specified - PPO

## 2021-08-20 DIAGNOSIS — E8729 Other acidosis: Secondary | ICD-10-CM | POA: Diagnosis not present

## 2021-08-20 DIAGNOSIS — Z7952 Long term (current) use of systemic steroids: Secondary | ICD-10-CM | POA: Diagnosis not present

## 2021-08-20 DIAGNOSIS — R531 Weakness: Secondary | ICD-10-CM | POA: Diagnosis not present

## 2021-08-20 DIAGNOSIS — R109 Unspecified abdominal pain: Secondary | ICD-10-CM | POA: Diagnosis not present

## 2021-08-20 LAB — GLUCOSE, CAPILLARY
Glucose-Capillary: 54 mg/dL — ABNORMAL LOW (ref 70–99)
Glucose-Capillary: 152 mg/dL — ABNORMAL HIGH (ref 70–99)
Glucose-Capillary: 141 mg/dL — ABNORMAL HIGH (ref 70–99)
Glucose-Capillary: 57 mg/dL — ABNORMAL LOW (ref 70–99)
Glucose-Capillary: 95 mg/dL (ref 70–99)
Glucose-Capillary: 92 mg/dL (ref 70–99)
Glucose-Capillary: 79 mg/dL (ref 70–99)
Glucose-Capillary: 85 mg/dL (ref 70–99)
Glucose-Capillary: 79 mg/dL (ref 70–99)
Glucose-Capillary: 114 mg/dL — ABNORMAL HIGH (ref 70–99)

## 2021-08-20 LAB — CBC
HCT: 26.5 % — ABNORMAL LOW (ref 36.0–46.0)
Hemoglobin: 8.9 g/dL — ABNORMAL LOW (ref 12.0–15.0)
MCH: 29.9 pg (ref 26.0–34.0)
MCHC: 33.6 g/dL (ref 30.0–36.0)
MCV: 88.9 fL (ref 80.0–100.0)
Platelets: 55 10*3/uL — ABNORMAL LOW (ref 150–400)
RBC: 2.98 MIL/uL — ABNORMAL LOW (ref 3.87–5.11)
RDW: 17.2 % — ABNORMAL HIGH (ref 11.5–15.5)
WBC: 3 10*3/uL — ABNORMAL LOW (ref 4.0–10.5)
nRBC: 1 % — ABNORMAL HIGH (ref 0.0–0.2)

## 2021-08-20 LAB — BASIC METABOLIC PANEL
Anion gap: 9 (ref 5–15)
BUN: 17 mg/dL (ref 8–23)
CO2: 19 mmol/L — ABNORMAL LOW (ref 22–32)
Calcium: 8.8 mg/dL — ABNORMAL LOW (ref 8.9–10.3)
Chloride: 107 mmol/L (ref 98–111)
Creatinine, Ser: 0.8 mg/dL (ref 0.44–1.00)
GFR, Estimated: >60 mL/min (ref >60)
Glucose, Bld: 60 mg/dL — ABNORMAL LOW (ref 70–99)
Potassium: 3.8 mmol/L (ref 3.5–5.1)
Sodium: 135 mmol/L (ref 135–145)

## 2021-08-20 LAB — MRSA NEXT GEN BY PCR, NASAL: MRSA by PCR Next Gen: NOT DETECTED

## 2021-08-20 LAB — CULTURE, BLOOD (SINGLE): Special Requests: ADEQUATE

## 2021-08-20 LAB — CORTISOL: Cortisol, Plasma: 26.4 ug/dL

## 2021-08-20 MED ORDER — CHLORHEXIDINE GLUCONATE CLOTH 2 % EX PADS
6.0000 | MEDICATED_PAD | Freq: Every day | CUTANEOUS | Status: DC
  Administered 2021-08-20 – 2021-08-22 (×3): 6 via TOPICAL

## 2021-08-20 MED ORDER — MORPHINE SULFATE (PF) 2 MG/ML IV SOLN
1.0000 mg | Freq: Once | INTRAVENOUS | Status: AC
  Administered 2021-08-20: 1 mg via INTRAVENOUS
  Filled 2021-08-20: qty 1

## 2021-08-20 MED ORDER — LORAZEPAM 2 MG/ML IJ SOLN
0.5000 mg | Freq: Once | INTRAMUSCULAR | Status: AC
  Administered 2021-08-20: 0.5 mg via INTRAVENOUS
  Filled 2021-08-20: qty 1

## 2021-08-20 MED ORDER — ORAL CARE MOUTH RINSE
15.0000 mL | Freq: Two times a day (BID) | OROMUCOSAL | Status: DC
  Administered 2021-08-21 (×2): 15 mL via OROMUCOSAL

## 2021-08-20 MED ORDER — DEXTROSE 10 % IV SOLN: INTRAVENOUS | Status: DC

## 2021-08-20 MED ORDER — HYDROCORTISONE SOD SUC (PF) 100 MG IJ SOLR
100.0000 mg | Freq: Two times a day (BID) | INTRAMUSCULAR | Status: DC
  Administered 2021-08-20 – 2021-08-23 (×6): 100 mg via INTRAVENOUS
  Filled 2021-08-20 (×6): qty 2

## 2021-08-20 MED ORDER — THIAMINE HCL 100 MG/ML IJ SOLN
500.0000 mg | Freq: Three times a day (TID) | INTRAVENOUS | Status: DC
  Administered 2021-08-20 – 2021-08-22 (×8): 500 mg via INTRAVENOUS
  Filled 2021-08-20 (×11): qty 5

## 2021-08-20 MED ORDER — DEXTROSE 50 % IV SOLN
12.5000 g | INTRAVENOUS | Status: AC
  Administered 2021-08-20: 12.5 g via INTRAVENOUS

## 2021-08-20 MED ORDER — DEXTROSE 50 % IV SOLN
INTRAVENOUS | Status: AC
  Filled 2021-08-20: qty 50

## 2021-08-20 MED ORDER — DEXTROSE 5 % IV SOLN
INTRAVENOUS | Status: DC
Start: 2021-08-20 — End: 2021-08-20

## 2021-08-20 MED ORDER — KETOROLAC TROMETHAMINE 30 MG/ML IJ SOLN
30.0000 mg | Freq: Once | INTRAMUSCULAR | Status: AC
  Administered 2021-08-20: 30 mg via INTRAVENOUS
  Filled 2021-08-20: qty 1

## 2021-08-20 MED ORDER — DEXTROSE 50 % IV SOLN
25.0000 g | INTRAVENOUS | Status: AC
  Administered 2021-08-20: 25 g via INTRAVENOUS

## 2021-08-20 MED ORDER — HYDROCORTISONE SOD SUC (PF) 100 MG IJ SOLR
100.0000 mg | Freq: Once | INTRAMUSCULAR | Status: AC
  Administered 2021-08-20: 100 mg via INTRAVENOUS
  Filled 2021-08-20: qty 2

## 2021-08-20 MED ORDER — OXYCODONE HCL 5 MG PO TABS
5.0000 mg | ORAL_TABLET | ORAL | Status: DC | PRN
  Administered 2021-08-20 – 2021-08-21 (×2): 5 mg via ORAL
  Filled 2021-08-20 (×2): qty 1

## 2021-08-20 NOTE — Progress Notes (Signed)
Hematology f/u note  ? ?Pt was transferred to ICU due to her AMS and agitation. Pt was moaning and restless, does not answer questions. Lab reviewed. If her mental status does not improve, will hold on bone marrow biopsy.  ? ?Carrie [name]  ?08/20/2021  ?

## 2021-08-20 NOTE — Progress Notes (Signed)
Upon arrival to SD, patient has been repeatedly moaning, frowning, and showing signs of restlessness. Dr Sharl Ma notified by this RN in regards to PRN pain medication. When MD came to bedside, pt was resting. No additional orders at this time received. Per MD, he will round again on pt prior to shift change to reassess. This RN will continue to monitor pt for signs of discomfort.  ?

## 2021-08-20 NOTE — Progress Notes (Signed)
Hypoglycemic Event ? ?CBG: 54 ? ?Treatment: D50 50 mL (25 gm) ? ?Symptoms:  Pt asleep  ? ?Follow-up CBG: UJWJ:[id number]:[id number] CBG Result:152 ? ?Possible Reasons for Event: Inadequate meal intake ? ?Comments/MD notified:Lama ? ? ? ?Burnard Buntingbby S Ota Ebersole ? ? ?

## 2021-08-20 NOTE — Progress Notes (Signed)
At 0330 received patient from off going nurse, patient constantly moaning and yelling and patient states that she's having abdominal pain, notified NP Garner Nash and given morphine 0.5 ml. patient asleep and will continue plan of care. ?

## 2021-08-20 NOTE — Progress Notes (Signed)
[facility] ? ?PROGRESS NOTE ? ?Carrie [name]:[id number] DOB: 1960-11-26 DOA: 08/08/2021 ?PCP: Ann Held, DO ? ? ?Brief HPI:   ?61 y.o. female past medical history significant for depression, anxiety dermatomyositis, presyncope history of rheumatoid arthritis on Plaquenil, mycophenolate and daily prednisone comes into the ED for the first time in the past 12 days for altered mental status abdominal pain and constipation for several days with significant decreased oral intake. according to the niece prednisone was lowered to 17.5 mg a day and subsequently had to be increased to 40 mg a day she had similar symptoms in the past when she is tapered down on her steroids ? ? ? ?Subjective  ? ?Patient received Ativan and morphine last night and has been lethargic this morning.  CBG has been dropping.  Required multiple doses of D50 for hypoglycemia. ? ? Assessment/Plan:  ? ? ? ?Acute metabolic encephalopathy ?-Patient presented with encephalopathy; unclear etiology ?-She was recently tapered off steroids; concern for secondary adrenal insufficiency ?-Cortisol was checked today which was 26, patient given high-dose hydrocortisone 100 mg IV ?-MRI brain obtained 2 days ago showed no acute findings ?-Called and discussed with neurology, will obtain EEG ?-We will obtain thiamine level ?-We will start high-dose thiamine 500 mg IV 3 times daily ?-Neurology to see patient in a.m. ? ? ?Hypoglycemia/?  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 ? ?Pancytopenia ?-Unclear etiology; patient has been taking Plaquenil and mycophenolate at home ?-Folic acid is 3.8; replaced orally ?-Hematology was consulted; plan was to obtain bone marrow biopsy ?-Bone marrow biopsy currently on hold due to patient's  worsening mental status ? ?Acute kidney injury ?-Resolved ? ?Hypertension ?-ACE inhibitor on hold in setting of acute kidney injury ? ?Rheumatoid arthritis ?-Plaquenil, mycophenolate on hold ?-Continue steroids ? ?Incidental adrenal nodule ?-Patient will need nonemergent adrenal protocol CT scan ? ?Generalized weakness ?-As per patient's family patient has dermatomyositis, rheumatoid arthritis ?-Rheumatologist was tapering of steroids and she gets the symptoms whenever steroids were tapered in the past ? ?Hyperchloremic metabolic acidosis ?-Has been stable for past few days ?-Follow BMP in am ? ?Elevated B12 level ?-We will discontinue B12 supplementation ? ?Medications ? ?  ? ARIPiprazole  5 mg Oral QHS  ? buPROPion  300 mg Oral Daily  ? Chlorhexidine Gluconate Cloth  6 each Topical Daily  ? escitalopram  20 mg Oral Daily  ? feeding supplement  237 mL Oral BID BM  ? folic acid  1 mg Oral Daily  ? mouth rinse  15 mL Mouth Rinse BID  ? multivitamin with minerals  1 tablet Oral Daily  ? nutrition supplement (JUVEN)  1 packet Oral BID BM  ? pantoprazole  40 mg Oral Daily  ? senna-docusate  1 tablet Oral BID  ? vitamin B-12  1,000 mcg Oral Daily  ? ? ? Data Reviewed:  ? ?CBG: ? ?Recent Labs  ?Lab 08/20/21 ?1213 08/20/21 ?1300 08/20/21 ?1352 08/20/21 ?1500 08/20/21 ?1710  ?GLUCAP 95 92 79 85 79  ? ? ?SpO2: 97 % ?O2 Flow Rate (L/min): 2 L/min  ? ? ?Vitals:  ? 08/20/21 1511 08/20/21 1600 08/20/21 1700 08/20/21 1800  ?BP: 139/66 (!) 144/94 (!) 147/102 (!) 168/88  ?Pulse: (!) 115 (!) 107 (!) 114 (!) 112  ?Resp: Marland Kitchen)  23  20 (!) 23  ?Temp: 100.1 ?F (37.8 ?C)  99.3 ?F (37.4 ?C)   ?TempSrc: Rectal  Axillary   ?SpO2: 97% 96% 95% 97%  ?Weight: 62 kg     ?Height: _0  (1.549 m)     ? ? ? ? ?Data Reviewed: ? ?Basic Metabolic Panel: ?Recent Labs  ?Lab 09/01/2021 ?0502 09/05/2021 ?0800 08/18/21 ?0603 08/19/21 ?9562 08/20/21 ?1308  ?NA 134*  --  135 132* 135  ?K 3.7  --  3.3* 3.9 3.8  ?CL 107  --  109 108 107  ?CO2 17*  --  18* 18* 19*   ?GLUCOSE 113*  --  91 75 60*  ?BUN 36*  --  24* 17 17  ?CREATININE 1.37*  --  0.90 0.85 0.80  ?CALCIUM 9.3  --  8.4* 8.4* 8.8*  ?MG 1.9  --   --   --   --   ?PHOS  --  2.8  --   --   --   ? ? ?CBC: ?Recent Labs  ?Lab 08/21/2021 ?0502 08/18/21 ?0603 08/19/21 ?6578 08/20/21 ?4696  ?WBC 3.7* 2.4* 2.2* 3.0*  ?NEUTROABS  --   --  1.8  --   ?HGB 10.7* 8.1* 8.8* 8.9*  ?HCT 31.5* 24.6* 26.0* 26.5*  ?MCV 89.0 89.5 88.7 88.9  ?PLT 91* 60* 58* 55*  ? ? ?LFT ?Recent Labs  ?Lab 09/05/2021 ?0502 08/18/21 ?0603  ?AST 44* 43*  ?ALT 22 21  ?ALKPHOS 61 50  ?BILITOT 1.1 0.8  ?PROT 5.0* 3.9*  ?ALBUMIN 2.7* 2.1*  ? ?  ?Antibiotics: ?Anti-infectives (From admission, onward)  ? ? Start     Dose/Rate Route Frequency Ordered Stop  ? 08/14/2021 0700  cefTRIAXone (ROCEPHIN) 2 g in sodium chloride 0.9 % 100 mL IVPB       ? 2 g ?200 mL/hr over 30 Minutes Intravenous  Once 08/07/2021 2952 08/24/2021 1633  ? ?  ? ? ? ?DVT prophylaxis: SCDs ? ?Code Status: Full code ? ?Family Communication: No family at bedside ? ? ?CONSULTS oncology ? ? ?Objective  ? ? ?Physical Examination: ? ? ?General-appears lethargic ?Heart-S1-S2, regular, no murmur auscultated ?Lungs-clear to auscultation bilaterally, no wheezing or crackles auscultated ?Abdomen-soft, nontender, no organomegaly ?Extremities-no edema in the lower extremities ?Neuro-lethargic, opens eyes briefly and then falls back to sleep, not following commands ? ? ?Status is: Inpatient: Altered mental status ? ? ? ?Pressure Injury 08/18/2021 Hand Posterior;Right Stage 3 -  Full thickness tissue loss. Subcutaneous fat may be visible but bone, tendon or muscle are NOT exposed. (Active)  ?08/24/2021 0900  ?Location: Hand  ?Location Orientation: Posterior;Right  ?Staging: Stage 3 -  Full thickness tissue loss. Subcutaneous fat may be visible but bone, tendon or muscle are NOT exposed.  ?Wound Description (Comments):   ?Present on Admission: Yes  ?   ?Pressure Injury 09/05/2021 Sacrum Mid Stage 3 -  Full thickness tissue  loss. Subcutaneous fat may be visible but bone, tendon or muscle are NOT exposed. (Active)  ?09/02/2021 0900  ?Location: Sacrum  ?Location Orientation: Mid  ?Staging: Stage 3 -  Full thickness tissue loss. Subcutaneous fat may be visible but bone, tendon or muscle are NOT exposed.  ?Wound Description (Comments):   ?Present on Admission: Yes  ? ? ? ? ? ? ? ? ?Oswald Hillock ?  ?[facility] ?If 7PM-7AM, please contact night-coverage at [email or web address], ?Office  [phone] ? ? ?08/20/2021, 6:58 PM  LOS: 2 days  ? ? ? ? ? ? ? ? ? ? ?  ?

## 2021-08-20 NOTE — Progress Notes (Signed)
Pt transferred to room 1429, report given. Pt sister Okey Dupre Chance) notified of transfer via phone call.  ?

## 2021-08-20 NOTE — TOC Progression Note (Signed)
Transition of Care (TOC) - Progression Note  ? ? ?Patient Details  ?Name: Carrie [name] ?MRN: [id number] ?Date of Birth: October 01, 1960 ? ?Transition of Care (TOC) CM/SW Contact  ?Dessa Phi, RN ?Phone Number: ?08/20/2021, 2:45 PM ? ?Clinical Narrative: Transferred to SDU-AMS,hypoglycemia. Continue to monitor progress.   ? ? ? ?Expected Discharge Plan: [place] ?Barriers to Discharge: Continued Medical Work up ? ?Expected Discharge Plan and Services ?Expected Discharge Plan: [place] ?  ?Discharge Planning Services: CM Consult ?Post Acute Care Choice: Home Health ?Living arrangements for the past 2 months: [place] ?                ?  ?  ?  ?  ?  ?  ?  ?  ?  ?  ? ? ?Social Determinants of Health (SDOH) Interventions ?  ? ?Readmission Risk Interventions ?Readmission Risk Prevention Plan 08/18/2021  ?Transportation Screening Complete  ?Medication Review Press photographer) Complete  ?[place] or Home Care Consult Complete  ?SW Recovery Care/Counseling Consult Complete  ?Palliative Care Screening Not Applicable  ?Skilled Nursing Facility Complete  ?Some recent data might be hidden  ? ? ?

## 2021-08-20 NOTE — Progress Notes (Signed)
Pt is very confused and yelling out loud, restless and uncooperative with care including vital signs. On call notified and aware. Will cont to monitor pt. ?

## 2021-08-20 NOTE — Progress Notes (Signed)
Rapid RN called to bedside to assess patients change in mentation. MD placed orders to correct patients hypoglycemia. Will continue to assess patients blood sugars and vitals closely and report any abnormal findings. Yellow MEWS protocol initiated.   ? 08/20/21 1128  ?Assess: MEWS Score  ?Temp 100.2 ?F (37.9 ?C)  ?BP 136/90  ?Pulse Rate (!) 108  ?Resp (!) 28  ?SpO2 96 %  ?O2 Device Nasal Cannula  ?O2 Flow Rate (L/min) 2 L/min  ?Assess: MEWS Score  ?MEWS Temp 0  ?MEWS Systolic 0  ?MEWS Pulse 1  ?MEWS RR 2  ?MEWS LOC 0  ?MEWS Score 3  ?MEWS Score Color Yellow  ?Assess: if the MEWS score is Yellow or Red  ?Were vital signs taken at a resting state? Yes  ?Focused Assessment No change from prior assessment  ?Does the patient meet 2 or more of the SIRS criteria? Yes  ?Does the patient have a confirmed or suspected source of infection? Yes  ?Provider and Rapid Response Notified? Yes  ?MEWS guidelines implemented *See Row Information* Yes  ?Take Vital Signs  ?Increase Vital Sign Frequency  Yellow: Q 2hr X 2 then Q 4hr X 2, if remains yellow, continue Q 4hrs  ?Escalate  ?MEWS: Escalate Yellow: discuss with charge nurse/RN and consider discussing with provider and RRT  ?Notify: Charge Nurse/RN  ?Name of Charge Nurse/RN Notified Megan RN  ?Date Charge Nurse/RN Notified 08/20/21  ?Time Charge Nurse/RN Notified 1100  ?Notify: Provider  ?Provider Name/Title [place]  ?Date Provider Notified 08/20/21  ?Time Provider Notified 1145  ?Notification Type Page  ?Notification Reason Change in status  ?Provider response At bedside;See new orders  ?Date of Provider Response 08/20/21  ?Time of Provider Response 1200  ?Notify: Rapid Response  ?Name of Rapid Response RN Notified Rochel Brome  ?Date Rapid Response Notified 08/20/21  ?Time Rapid Response Notified 1130  ?Document  ?Patient Outcome Other (Comment) ?(Orders carried out, assessing to see if interventions are effective, otherwise will transfer to ICU/SD)  ?Progress note created (see row  info) Yes  ?Assess: SIRS CRITERIA  ?SIRS Temperature  0  ?SIRS Pulse 1  ?SIRS Respirations  1  ?SIRS WBC 1  ?SIRS Score Sum  3  ? ? ?

## 2021-08-20 NOTE — Significant Event (Signed)
Rapid Response Event Note  ? ?Reason for Call : AMS, hypoglycemia  ?Notified by beside RN, Abby, in regards to patient having a change in mental status. Patient was drowsy upon arrival to patient's room. Patient was able to localize pain but unable to answer questions. Patient would moan to a painful stimulus when applied. According to staff that worked yesterday, patient was able to get out of bed with assistance and was alert. 12.5g of D50 given prior to arrival, blood sugar did rebound from 57 to 141. Patient did receive one dose of ativan 0.5mg  and morphine 1mg  previous shift for abdominal pain and agitation/restlessness.  ? ?Initial Focused Assessment:  ?Neuro: Patient unable to follow commands, able to localize pain when painful stimulus applied via sternal rub or nail bed pressure. Patient able to move extremities in response to painful stimuli. Mentation did not improve after hypoglycemia was corrected. Pupils were 2+, equal, round, reactive to light bilaterally.  ? ?Rectal temp: 100.2 F  ?Cardiac: ST 110s, BP 136/90 ?Pulmonary: 2L [place], 96% O2 saturations, RR 20-30  ?Skin:  ?Sacral wound- unstageable, tunneling  ?Right hand wound-non healing wound  ? ?Interventions:  ?Notified MD Lama in regards to AMS, low grade fever, episodes of hypoglycemia  ?MD [place] ordered dextrose 5% IV solution with cortisol level, solu-cortef  ?Plan of Care:  ?Continue to monitor patient in 5 [place]. Monitor patients CBGs for the next 2hrs, q1. Also, monitor patient's mental and hemodynamic status for changes and deterioration.  ?Please call rapid response at [phone] if patient has significant change in status.  ? ?Event Summary:  ? ?MD Notified: MD [place] notified at 1141 ?Call Time: Rapid Response called at 1109 ?Arrival Time: 1111 ?End Time: 1215  ? ?Laurence Slate, RN ?

## 2021-08-21 ENCOUNTER — Inpatient Hospital Stay (HOSPITAL_COMMUNITY)
Admit: 2021-08-21 | Discharge: 2021-08-21 | Disposition: A | Discharge status: Home / Self Care | Payer: Federal, State, Local not specified - PPO | Attending: Family Medicine | Admitting: Family Medicine

## 2021-08-21 ENCOUNTER — Ambulatory Visit: Payer: Federal, State, Local not specified - PPO | Admitting: Family Medicine

## 2021-08-21 DIAGNOSIS — Z7952 Long term (current) use of systemic steroids: Secondary | ICD-10-CM | POA: Diagnosis not present

## 2021-08-21 DIAGNOSIS — R109 Unspecified abdominal pain: Secondary | ICD-10-CM | POA: Diagnosis not present

## 2021-08-21 DIAGNOSIS — R4182 Altered mental status, unspecified: Secondary | ICD-10-CM | POA: Diagnosis not present

## 2021-08-21 DIAGNOSIS — M069 Rheumatoid arthritis, unspecified: Secondary | ICD-10-CM | POA: Diagnosis not present

## 2021-08-21 DIAGNOSIS — D61818 Other pancytopenia: Secondary | ICD-10-CM | POA: Diagnosis not present

## 2021-08-21 DIAGNOSIS — E872 Acidosis, unspecified: Secondary | ICD-10-CM | POA: Diagnosis not present

## 2021-08-21 LAB — COMPREHENSIVE METABOLIC PANEL
ALT: 48 U/L — ABNORMAL HIGH (ref 0–44)
AST: 124 U/L — ABNORMAL HIGH (ref 15–41)
Albumin: 1.9 g/dL — ABNORMAL LOW (ref 3.5–5.0)
Alkaline Phosphatase: 127 U/L — ABNORMAL HIGH (ref 38–126)
Anion gap: 10 (ref 5–15)
BUN: 15 mg/dL (ref 8–23)
CO2: 17 mmol/L — ABNORMAL LOW (ref 22–32)
Calcium: 8 mg/dL — ABNORMAL LOW (ref 8.9–10.3)
Chloride: 99 mmol/L (ref 98–111)
Creatinine, Ser: 0.77 mg/dL (ref 0.44–1.00)
GFR, Estimated: >60 mL/min (ref >60)
Glucose, Bld: 127 mg/dL — ABNORMAL HIGH (ref 70–99)
Potassium: 3.6 mmol/L (ref 3.5–5.1)
Sodium: 126 mmol/L — ABNORMAL LOW (ref 135–145)
Total Bilirubin: 1.1 mg/dL (ref 0.3–1.2)
Total Protein: 3.7 g/dL — ABNORMAL LOW (ref 6.5–8.1)

## 2021-08-21 LAB — PROTIME-INR
INR: 1.3 — ABNORMAL HIGH (ref 0.8–1.2)
Prothrombin Time: 16.2 seconds — ABNORMAL HIGH (ref 11.4–15.2)

## 2021-08-21 LAB — CBC
HCT: 25.1 % — ABNORMAL LOW (ref 36.0–46.0)
Hemoglobin: 8.4 g/dL — ABNORMAL LOW (ref 12.0–15.0)
MCH: 29.6 pg (ref 26.0–34.0)
MCHC: 33.5 g/dL (ref 30.0–36.0)
MCV: 88.4 fL (ref 80.0–100.0)
Platelets: 42 10*3/uL — ABNORMAL LOW (ref 150–400)
RBC: 2.84 MIL/uL — ABNORMAL LOW (ref 3.87–5.11)
RDW: 17.2 % — ABNORMAL HIGH (ref 11.5–15.5)
WBC: 3.5 10*3/uL — ABNORMAL LOW (ref 4.0–10.5)
nRBC: 0 % (ref 0.0–0.2)

## 2021-08-21 LAB — CBC WITH DIFFERENTIAL/PLATELET
Abs Immature Granulocytes: 0.08 10*3/uL — ABNORMAL HIGH (ref 0.00–0.07)
Basophils Absolute: 0 10*3/uL (ref 0.0–0.1)
Basophils Relative: 0 %
Eosinophils Absolute: 0 10*3/uL (ref 0.0–0.5)
Eosinophils Relative: 0 %
HCT: 23.9 % — ABNORMAL LOW (ref 36.0–46.0)
Hemoglobin: 8.2 g/dL — ABNORMAL LOW (ref 12.0–15.0)
Immature Granulocytes: 2 %
Lymphocytes Relative: 28 %
Lymphs Abs: 1.1 10*3/uL (ref 0.7–4.0)
MCH: 29.7 pg (ref 26.0–34.0)
MCHC: 34.3 g/dL (ref 30.0–36.0)
MCV: 86.6 fL (ref 80.0–100.0)
Monocytes Absolute: 0.1 10*3/uL (ref 0.1–1.0)
Monocytes Relative: 2 %
Neutro Abs: 2.7 10*3/uL (ref 1.7–7.7)
Neutrophils Relative %: 68 %
Platelets: 47 10*3/uL — ABNORMAL LOW (ref 150–400)
RBC: 2.76 MIL/uL — ABNORMAL LOW (ref 3.87–5.11)
RDW: 17 % — ABNORMAL HIGH (ref 11.5–15.5)
WBC: 3.9 10*3/uL — ABNORMAL LOW (ref 4.0–10.5)
nRBC: 0 % (ref 0.0–0.2)

## 2021-08-21 LAB — APTT: aPTT: 42 seconds — ABNORMAL HIGH (ref 24–36)

## 2021-08-21 LAB — GLUCOSE, CAPILLARY
Glucose-Capillary: 162 mg/dL — ABNORMAL HIGH (ref 70–99)
Glucose-Capillary: 128 mg/dL — ABNORMAL HIGH (ref 70–99)
Glucose-Capillary: 155 mg/dL — ABNORMAL HIGH (ref 70–99)
Glucose-Capillary: 191 mg/dL — ABNORMAL HIGH (ref 70–99)

## 2021-08-21 LAB — CRYPTOCOCCAL ANTIGEN: Crypto Ag: NEGATIVE

## 2021-08-21 LAB — HIV ANTIBODY (ROUTINE TESTING W REFLEX): HIV Screen 4th Generation wRfx: NONREACTIVE

## 2021-08-21 MED ORDER — SODIUM CHLORIDE 0.9 % IV SOLN: 2.0000 g | INTRAVENOUS | Status: DC

## 2021-08-21 MED ORDER — DEXTROSE-NACL 5-0.45 % IV SOLN
INTRAVENOUS | Status: DC
Start: 2021-08-21 — End: 2021-08-22

## 2021-08-21 MED ORDER — BISACODYL 10 MG RE SUPP
10.0000 mg | Freq: Once | RECTAL | Status: AC
  Administered 2021-08-22: 10 mg via RECTAL
  Filled 2021-08-21: qty 1

## 2021-08-21 MED ORDER — DEXTROSE 5 % IV SOLN
10.0000 mg/kg | Freq: Three times a day (TID) | INTRAVENOUS | Status: DC
  Administered 2021-08-21 – 2021-08-23 (×4): 620 mg via INTRAVENOUS
  Filled 2021-08-21 (×6): qty 12.4

## 2021-08-21 MED ORDER — SODIUM CHLORIDE 0.9 % IV SOLN: 2.0000 g | Freq: Two times a day (BID) | INTRAVENOUS | Status: DC

## 2021-08-21 MED ORDER — LORAZEPAM 2 MG/ML IJ SOLN
1.0000 mg | Freq: Once | INTRAMUSCULAR | Status: DC
  Filled 2021-08-21: qty 1

## 2021-08-21 MED ORDER — LORAZEPAM 2 MG/ML IJ SOLN: 0.5000 mg | Freq: Once | INTRAMUSCULAR | Status: DC

## 2021-08-21 MED ORDER — VANCOMYCIN HCL IN DEXTROSE 1-5 GM/200ML-% IV SOLN: 1000.0000 mg | Freq: Once | INTRAVENOUS | Status: DC

## 2021-08-21 NOTE — Progress Notes (Signed)
PT Cancellation Note ? ?Patient Details ?Name: Carrie CoxLivia [name]  ?MRN: [id number] ?DOB: 10/31/1960 ? ? ?Cancelled Treatment:    Reason Eval/Treat Not Completed: Medical issues which prohibited therapy (RN reports pt is not doing well, will follow up at later date/time as schedule allows and pt able.) ? ? ?Renaldo Fiddlerachel Q. PT, DPT ?Acute Rehabilitation Services ?Office [phone] ?Pager [phone] ? ?

## 2021-08-21 NOTE — Progress Notes (Signed)
[facility] ? ?PROGRESS NOTE ? ?Carrie [name] AST:[id number] DOB: 1960/08/28 DOA: 08/15/2021 ?PCP: Ann Held, DO ? ? ?Brief HPI:   ?61 y.o. female past medical history significant for depression, anxiety dermatomyositis, presyncope history of rheumatoid arthritis on Plaquenil, mycophenolate and daily prednisone comes into the ED for the first time in the past 12 days for altered mental status abdominal pain and constipation for several days with significant decreased oral intake. according to the niece prednisone was lowered to 17.5 mg a day and subsequently had to be increased to 40 mg a day she had similar symptoms in the past when she is tapered down on her steroids ? ? ? ?Subjective  ? ?Patient seen and examined, she is more alert today.  Responding to simple questions. ? ? Assessment/Plan:  ? ? ? ?Acute metabolic encephalopathy ?-Patient presented with encephalopathy; unclear etiology ?-She was recently tapered off steroids; concern for secondary adrenal insufficiency ?-Cortisol was checked today which was 26, patient given high-dose hydrocortisone 100 mg IV ?-Started on hydrocortisone 50 mg IV every 6 hours ?-MRI brain obtained 2 days ago showed no acute findings ?-Called and discussed with neurology, EEG was ordered ?-Thiamine level obtained and patient started on high-dose thiamine; 500 mg IV 3 times daily ?-Neurology following ?-Discussed with neurology, patient can have atypical CNS infection, LP was ordered however could not be obtained as patient has low platelet count ?-We will obtain formal ID consultation to see if empiric antibiotics can be started on this patient ?-Repeat blood cultures have been obtained this morning ? ?Positive blood cultures/Staphylococcus auricularis ?-1/4 blood culture positive for Staphylococcus auricularis ?-Likely contamination ?-Repeat blood cultures drawn this morning ? ?Transaminitis ?-Unclear etiology ?-She had abdominal ultrasound on 08/10/2021, which  showed gallbladder sludge and gallbladder wall thickening, no gallstones or sonographic Murphy sign  ?-CBD was mildly dilated measuring 7 mm ?-Check hepatitis panel ? ? ?Hypoglycemia/?  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 ?Hyponatremia ?-Sodium 126 today, likely from hypotonic D10W given for hypoglycemia as above ?-Fluids have been changed to D5 half-normal saline at 75 mill per hour ?-We will follow BMP in am ? ? ?Pancytopenia ?-Unclear etiology; patient has been taking Plaquenil and mycophenolate at home ?-Folic acid is 3.8; replaced orally ?-Hematology was consulted; plan was to obtain bone marrow biopsy ?-Bone marrow biopsy currently on hold due to patient's worsening mental status ? ?Acute kidney injury ?-Resolved ? ?Hypertension ?-ACE inhibitor on hold in setting of acute kidney injury ? ?Rheumatoid arthritis ?-Plaquenil, mycophenolate on hold ?-Continue steroids ? ?Incidental adrenal nodule ?-Patient will need nonemergent adrenal protocol CT scan ? ?Generalized weakness ?-As per patient's family patient has dermatomyositis, rheumatoid arthritis ?-Rheumatologist was tapering of steroids and she gets the symptoms whenever steroids were tapered in the past ? ?Hyperchloremic metabolic acidosis ?-Has been stable for past few days ?-Follow BMP in am ? ?Elevated B12 level ?-We will discontinue B12 supplementation ? ?Medications ? ?  ? ARIPiprazole  5 mg Oral QHS  ? buPROPion  300 mg Oral Daily  ? Chlorhexidine Gluconate Cloth  6 each Topical Daily  ? escitalopram  20 mg Oral Daily  ? feeding supplement  237 mL Oral BID BM  ? folic acid  1 mg Oral  Daily  ? hydrocortisone sod succinate (SOLU-CORTEF) inj  100 mg Intravenous Q12H  ? mouth rinse  15 mL Mouth Rinse BID  ? multivitamin with minerals  1 tablet Oral Daily  ? nutrition supplement (JUVEN)  1 packet Oral BID BM  ? pantoprazole  40 mg Oral Daily  ? senna-docusate  1 tablet Oral BID  ? ? ? Data Reviewed:  ? ?CBG: ? ?Recent Labs  ?Lab 08/20/21 ?1500 08/20/21 ?1710 08/20/21 ?2135 08/21/21 ?0719 08/21/21 ?1155  ?GLUCAP 85 79 114* 162* 128*  ? ? ?SpO2: 95 % ?O2 Flow Rate (L/min): 2 L/min  ? ? ?Vitals:  ? 08/21/21 0256 08/21/21 0800 08/21/21 1200 08/21/21 1213  ?BP: (!) 129/92 123/85 127/75   ?Pulse: 86 (!) 110 (!) 109   ?Resp: (!) 22 (!) 25 (!) 26   ?Temp:  97.9 ?F (36.6 ?C)  97.7 ?F (36.5 ?C)  ?TempSrc:  Axillary  Axillary  ?SpO2: 99% 95% 95%   ?Weight:      ?Height:      ? ? ? ? ?Data Reviewed: ? ?Basic Metabolic Panel: ?Recent Labs  ?Lab 08/12/2021 ?0502 08/08/2021 ?0800 08/18/21 ?0603 08/19/21 ?1062 08/20/21 ?6948 08/21/21 ?0256  ?NA 134*  --  135 132* 135 126*  ?K 3.7  --  3.3* 3.9 3.8 3.6  ?CL 107  --  109 108 107 99  ?CO2 17*  --  18* 18* 19* 17*  ?GLUCOSE 113*  --  91 75 60* 127*  ?BUN 36*  --  24* _0 ?CREATININE 1.37*  --  0.90 0.85 0.80 0.77  ?CALCIUM 9.3  --  8.4* 8.4* 8.8* 8.0*  ?MG 1.9  --   --   --   --   --   ?PHOS  --  2.8  --   --   --   --   ? ? ?CBC: ?Recent Labs  ?Lab 08/18/21 ?0603 08/19/21 ?0806 08/20/21 ?5462 08/21/21 ?0256 08/21/21 ?7035  ?WBC 2.4* 2.2* 3.0* 3.5* 3.9*  ?NEUTROABS  --  1.8  --   --  2.7  ?HGB 8.1* 8.8* 8.9* 8.4* 8.2*  ?HCT 24.6* 26.0* 26.5* 25.1* 23.9*  ?MCV 89.5 88.7 88.9 88.4 86.6  ?PLT 60* 58* 55* 42* 47*  ? ? ?LFT ?Recent Labs  ?Lab 08/20/2021 ?0502 08/18/21 ?0093 08/21/21 ?0256  ?AST 44* 43* 124*  ?ALT 22 21 48*  ?ALKPHOS 61 50 127*  ?BILITOT 1.1 0.8 1.1  ?PROT 5.0* 3.9* 3.7*  ?ALBUMIN 2.7* 2.1* 1.9*  ? ?  ?Antibiotics: ?Anti-infectives (From admission, onward)  ? ? Start     Dose/Rate Route Frequency Ordered Stop  ? 08/21/21 0945  cefTRIAXone (ROCEPHIN) 2 g in  sodium chloride 0.9 % 100 mL IVPB  Status:  Discontinued       ? 2 g ?200 mL/hr over 30 Minutes Intravenous Every 24 hours 08/21/21 0849 08/21/21 0851  ? 08/21/21 0930  vancomycin (VANCOCIN) IVPB 1000 mg/200 mL premix  Status:  Discontinued       ? 1,000 mg ?200 mL/hr over 60 Minutes Intravenous  Once 08/21/21 0837 08/21/21 0842  ? 08/21/21 0930  cefTRIAXone (ROCEPHIN) 2 g in sodium chloride 0.9 % 100 mL IVPB  Status:  Discontinued       ? 2 g ?200 mL/hr over 30 Minutes Intravenous Every 12 hours 08/21/21 0837 08/21/21 0842  ? 08/21/21 0930  ampicillin (OMNIPEN) 2 g in  sodium chloride 0.9 % 100 mL IVPB  Status:  Discontinued       ? 2 g ?300 mL/hr over 20 Minutes Intravenous Every 4 hours 08/21/21 0837 08/21/21 0842  ? 08/20/2021 0700  cefTRIAXone (ROCEPHIN) 2 g in sodium chloride 0.9 % 100 mL IVPB       ? 2 g ?200 mL/hr over 30 Minutes Intravenous  Once 08/07/2021 4514 08/18/2021 1633  ? ?  ? ? ? ?DVT prophylaxis: SCDs ? ?Code Status: Full code ? ?Family Communication: No family at bedside ? ? ?CONSULTS oncology ? ? ?Objective  ? ? ?Physical Examination: ? ?General-appears in no acute distress ?Heart-S1-S2, regular, no murmur auscultated ?Lungs-clear to auscultation bilaterally, no wheezing or crackles auscultated ?Abdomen-soft, nontender, no organomegaly ?Extremities-no edema in the lower extremities ?Neuro-alert, oriented to self only, answering questions and following commands ? ? ?Status is: Inpatient: Altered mental status ? ? ? ?Pressure Injury 08/06/2021 Hand Posterior;Right Stage 3 -  Full thickness tissue loss. Subcutaneous fat may be visible but bone, tendon or muscle are NOT exposed. (Active)  ?09/02/2021 0900  ?Location: Hand  ?Location Orientation: Posterior;Right  ?Staging: Stage 3 -  Full thickness tissue loss. Subcutaneous fat may be visible but bone, tendon or muscle are NOT exposed.  ?Wound Description (Comments):   ?Present on Admission: Yes  ?   ?Pressure Injury 08/26/2021 Sacrum Mid Stage 3 -  Full  thickness tissue loss. Subcutaneous fat may be visible but bone, tendon or muscle are NOT exposed. (Active)  ?08/22/2021 0900  ?Location: Sacrum  ?Location Orientation: Mid  ?Staging: Stage 3 -  Full thickness tissue

## 2021-08-21 NOTE — Progress Notes (Signed)
Patient has been yelling and moaning continuously since before start of night shift. It is difficult to adequately assess orientation because she answers questions appropriately but then goes back to moaning and yelling. When you ask her if she is ok or needs anything she tells you no, she has even told me to shut up twice tonight. She is re-directable for about two seconds before she begins yelling again. ? ? I was instructed not to give any heavy sedatives, or morphine over night. I did give her the few pills I could push into her mouth after she told me that she was having pain at the start of shift. If mentation continues to worsen she should not be given anything by mouth, including pills, unless staff wants to risk a finger or two in the process. She is constantly pulling and removing devices, she successfully discontinued one of only two IV's that were present. I do not think it will be possible to place one until she is calmer, nor has she needed one. She has had to be completely changed more than once for removing her purewick. She needs to remain dayshift bath and wound care change unless mentation drastically approves.  ?

## 2021-08-21 NOTE — Consult Note (Addendum)
Neurology Consultation ? ?Reason for Consult: AMS ?Referring Physician: Dr. Darrick Meigs ? ?CC: AMS ? ?History is obtained from:medical record ? ?HPI: Carrie [name] is a 61 y.o. female with past medical history of HTN, HLD, anxiety/ depression, RA on hydroxychloroquine, mycophenolate and daily prednisone  dermatomyositis who presents to the ED for the third time in the past 2 weeks for AMS, abdominal pain, constipation for several days, decreased oral intake with dehydration.  her prednisone was lowered to 17.5 mg/day, but subsequently had to be increased back to 40 mg a day.  She has had similar symptoms in the past when her prednisone is tapered down.   ?On exam, patient is moaning and groaning, will stop when spoken to. She can tell me her name and answer "yes" to choices for place. She can follow simple commands. She appears to have some nuchal rigidity, however hard to decipher due to continuous moaning and groaning. She continues to yell "stop touching me" during exam ?Neurology has been consulted for assistance with AMS ? ? ?ROS:  Unable to obtain due to altered mental status.  ? ?Past Medical History:  ?Diagnosis Date  ? Depression with anxiety 05/26/2021  ? Dermatomyositis ([place]) 06/23/2021  ? Fatty liver 06/23/2021  ? Hyperlipidemia   ? Hypertension   ? Non-traumatic rhabdomyolysis   ? Pre-syncope 03/26/2021  ? Rheumatoid arthritis flare ([place]) 04/08/2021  ? ? ? ?Family History  ?Problem Relation Age of Onset  ? Breast cancer Mother   ?     deceased by 74  ? Breast cancer Sister 53  ? Diabetes Sister   ? Breast cancer Other   ? Coronary artery disease Other   ? Hypertension Other   ? ? ? ?Social History:  ? reports that she quit smoking about 7 years ago. Her smoking use included cigarettes. She has never used smokeless tobacco. She reports that she does not currently use alcohol. She reports that she does not use drugs. ? ?Medications ? ?Current Facility-Administered Medications:  ?  acetaminophen (TYLENOL) tablet  650 mg, 650 mg, Oral, Q6H PRN, 650 mg at 08/19/21 1652 **OR** acetaminophen (TYLENOL) suppository 650 mg, 650 mg, Rectal, Q6H PRN, Darrick Meigs, Marge Duncans, MD ?  ARIPiprazole (ABILIFY) tablet 5 mg, 5 mg, Oral, QHS, Darrick Meigs, Marge Duncans, MD, 5 mg at 08/20/21 2208 ?  buPROPion (WELLBUTRIN XL) 24 hr tablet 300 mg, 300 mg, Oral, Daily, Darrick Meigs, Marge Duncans, MD, 300 mg at 08/19/21 0944 ?  Chlorhexidine Gluconate Cloth 2 % PADS 6 each, 6 each, Topical, Daily, Oswald Hillock, MD, 6 each at 08/20/21 1502 ?  dextrose 10 % infusion, , Intravenous, Continuous, Oswald Hillock, MD, Last Rate: 75 mL/hr at 08/21/21 0325, Infusion Verify at 08/21/21 0325 ?  escitalopram (LEXAPRO) tablet 20 mg, 20 mg, Oral, Daily, Darrick Meigs, Marge Duncans, MD, 20 mg at 08/19/21 0945 ?  feeding supplement (ENSURE ENLIVE / ENSURE PLUS) liquid 237 mL, 237 mL, Oral, BID BM, Oswald Hillock, MD, 237 mL at 08/19/21 1654 ?  folic acid (FOLVITE) tablet 1 mg, 1 mg, Oral, Daily, Darrick Meigs, Marge Duncans, MD, 1 mg at 08/19/21 0944 ?  hydrocortisone sodium succinate (SOLU-CORTEF) 100 MG injection 100 mg, 100 mg, Intravenous, Q12H, Oswald Hillock, MD, 100 mg at 08/21/21 7048 ?  lip balm (CARMEX) ointment, , Topical, PRN, Darrick Meigs, Marge Duncans, MD ?  loratadine (CLARITIN) tablet 10 mg, 10 mg, Oral, Daily PRN, Oswald Hillock, MD, 10 mg at 08/19/21 2110 ?  MEDLINE mouth rinse, 15 mL,  Mouth Rinse, BID, Darrick Meigs, Marge Duncans, MD ?  multivitamin with minerals tablet 1 tablet, 1 tablet, Oral, Daily, Darrick Meigs, Marge Duncans, MD, 1 tablet at 08/19/21 0944 ?  nutrition supplement (JUVEN) (JUVEN) powder packet 1 packet, 1 packet, Oral, BID BM, Oswald Hillock, MD, 1 packet at 08/20/21 2010 ?  oxyCODONE (Oxy IR/ROXICODONE) immediate release tablet 5 mg, 5 mg, Oral, Q4H PRN, Oswald Hillock, MD, 5 mg at 08/20/21 2208 ?  pantoprazole (PROTONIX) EC tablet 40 mg, 40 mg, Oral, Daily, Darrick Meigs, Marge Duncans, MD, 40 mg at 08/19/21 0945 ?  prochlorperazine (COMPAZINE) injection 5 mg, 5 mg, Intravenous, Q4H PRN, Oswald Hillock, MD, 5 mg at 08/18/21 1444 ?  senna-docusate  (Senokot-S) tablet 1 tablet, 1 tablet, Oral, BID, Darrick Meigs, Marge Duncans, MD, 1 tablet at 08/19/21 0944 ?  thiamine 571m in normal saline (544m IVPB, 500 mg, Intravenous, TID, LaDarrick MeigsGaMarge DuncansMD, Stopped at 08/20/21 2225 ? ? ?Exam: ?Current vital signs: ?BP (!) 129/92   Pulse 86   Temp (!) 97 ?F (36.1 ?C) (Axillary)   Resp (!) 22   Ht _0  (1.549 m)   Wt 62 kg   SpO2 99%   BMI 25.83 kg/m?  ?Vital signs in last 24 hours: ?Temp:  [97 ?F (36.1 ?C)-100.5 ?F (38.1 ?C)] 97 ?F (36.1 ?C) (03/16 0000) ?Pulse Rate:  [86-115] 86 (03/16 0256) ?Resp:  [19-28] 22 (03/16 0256) ?BP: (108-168)/(66-102) 129/92 (03/16 0256) ?SpO2:  [92 %-100 %] 99 % (03/16 0256) ?Weight:  [62 kg] 62 kg (03/15 1511) ? ?GENERAL: lethargic moaning and screaming out ?HEENT: - Sclera edema bilateral Normocephalic and atraumatic, dry mm ?LUNGS - Clear to auscultation bilaterally with no wheezes ?CV - S1S2 RRR, no m/r/g, equal pulses bilaterally. ?ABDOMEN - Soft, nontender, nondistended with normoactive BS ?Ext: multiple bruises on arms, warm, well perfused, intact peripheral pulses, no edema ? ?NEURO:  ?Mental Status: she is confused, able to tell me her name, and able to say "yes" to hospital when given choices of where we are. She is very restless and agitated and exam is very difficult. She will follow one step simple commands. ?Language: speech is clear.   ?Cranial Nerves: PERRL 2 mm/brisk. EOM and visual fields difficult to assess, no facial asymmetry, facial sensation intact, hearing intact, tongue/uvula/soft palate midline, normal sternocleidomastoid and trapezius muscle strength. No evidence of tongue atrophy or fibrillations ?Motor: bilateral uppers 3/5, bilateral lowers are rigid 3/5, will withdraw to painful stimuli in all 4 extremities.  She has poor effort throughout ?Tone: is normal and bulk is normal ?Sensation- Intact to light touch bilaterally ?Coordination: unable to assess ?Gait- deferred ? ?NIH ? ?Labs ?I have reviewed labs in epic and  the results pertinent to this consultation are: ? ? ?CBC ?   ?Component Value Date/Time  ? WBC 3.5 (L) 08/21/2021 0256  ? RBC 2.84 (L) 08/21/2021 0256  ? HGB 8.4 (L) 08/21/2021 0256  ? HCT 25.1 (L) 08/21/2021 0256  ? PLT 42 (L) 08/21/2021 0256  ? MCV 88.4 08/21/2021 0256  ? MCH 29.6 08/21/2021 0256  ? MCHC 33.5 08/21/2021 0256  ? RDW 17.2 (H) 08/21/2021 0256  ? LYMPHSABS 0.2 (L) 08/19/2021 084034? MONOABS 0.1 08/19/2021 0806  ? EOSABS 0.0 08/19/2021 0806  ? BASOSABS 0.0 08/19/2021 0806  ? ? ?CMP  ?   ?Component Value Date/Time  ? NA 126 (L) 08/21/2021 0256  ? K 3.6 08/21/2021 0256  ? CL 99 08/21/2021 0256  ? CO2 17 (L)  08/21/2021 0256  ? GLUCOSE 127 (H) 08/21/2021 0256  ? BUN 15 08/21/2021 0256  ? CREATININE 0.77 08/21/2021 0256  ? CREATININE 1.22 (H) 09/29/2019 1513  ? CALCIUM 8.0 (L) 08/21/2021 0256  ? PROT 3.7 (L) 08/21/2021 0256  ? ALBUMIN 1.9 (L) 08/21/2021 0256  ? AST 124 (H) 08/21/2021 0256  ? ALT 48 (H) 08/21/2021 0256  ? ALKPHOS 127 (H) 08/21/2021 0256  ? BILITOT 1.1 08/21/2021 0256  ? GFRNONAA >60 08/21/2021 0256  ? GFRAA  08/24/2008 1731  ?  >60        ?The eGFR has been calculated ?using the MDRD equation. ?This calculation has not been ?validated in all clinical ?situations. ?eGFR's persistently ?<60 mL/min signify ?possible Chronic Kidney Disease.  ? ? ?Lipid Panel  ?   ?Component Value Date/Time  ? CHOL 190 12/24/2020 1612  ? TRIG 92.0 12/24/2020 1612  ? HDL 47.10 12/24/2020 1612  ? CHOLHDL 4 12/24/2020 1612  ? VLDL 18.4 12/24/2020 1612  ? [place] 124 (H) 12/24/2020 1612  ? LDLCALC 114 (H) 09/29/2019 1513  ? LDLDIRECT 213.8 08/29/2012 1624  ? ? ? ?Imaging ?I have reviewed the images obtained: ? ?CT-head 3/12: ?No acute process ? ?MRI examination of the brain 3/13 ?Motion degraded brain MRI without acute finding ? ?Assessment:  ?Carrie [name] is a 61 y.o. female with past medical history of HTN, HLD, anxiety/ depression, RA and   dermatomyositis on hydroxychloroquine, mycophenolate and daily  prednisone, who presents to the ED for the third time in the past 2 weeks for AMS, abdominal pain, constipation for several days, decreased oral intake with dehydration.  her prednisone was lowered to 17.5 mg/day, but su

## 2021-08-21 NOTE — Progress Notes (Signed)
? ? ?  OVERNIGHT PROGRESS REPORT ? ?Notified by RN for association of RN in the room with occasional answering of simple questions. ? ?After several entries into the room to assess or assist patient, it is found that each time a caregiver is in the room patient ceases calling out/moaning, etc and answers simple questions asked of her. ? ?Once the caregiver.(NT/RN) leave the room patient will resume sounding out. ? ?Gershon Cull MSNA MSN ACNPC-AG ?Acute Care Nurse Practitioner ?[facility] ?[place] ? ? ? ?

## 2021-08-21 NOTE — Hospital Course (Signed)
Elevate

## 2021-08-21 NOTE — Progress Notes (Signed)
Pharmacy Antibiotic Note ? ?Carrie [name] is a 61 y.o. female admitted on August 25, 2021 with rule out HSV encephalitis.  Pharmacy has been consulted for Acyclovir dosing. ? ?The patient is already on D5-1/2NS at 75 cc/hr (changed from D10W) - discussed with the provider and will increase to 125 cc/hr while on the Acyclovir to help prevent nephrotoxicity.  ? ?Plan: ?- Start Acyclovir 620 mg (10 mg/kg) IV every 8 hours ?- Increase IVF to 125 cc/hr while on Acyclovir ?- Will continue to follow renal function, culture results, and LOT plans ? ?Height: 5\' 1"  (154.9 cm) ?Weight: 62 kg (136 lb 11 oz) ?IBW/kg (Calculated) : 47.8 ? ?Temp (24hrs), Avg:98.2 ?F (36.8 ?C), Min:97 ?F (36.1 ?C), Max:99.3 ?F (37.4 ?C) ? ?Recent Labs  ?Lab 08/25/21 ?0502 2021-08-25 ?1149 08/18/21 ?0603 08/19/21 ?1761 08/20/21 ?6073 08/21/21 ?0256 08/21/21 ?7106  ?WBC 3.7*  --  2.4* 2.2* 3.0* 3.5* 3.9*  ?CREATININE 1.37*  --  0.90 0.85 0.80 0.77  --   ?LATICACIDVEN 3.1* 2.0*  --   --   --   --   --   ?  ?Estimated Creatinine Clearance: 61.6 mL/min (by C-G formula based on SCr of 0.77 mg/dL).   ? ?Allergies  ?Allergen Reactions  ? Zoloft [Sertraline Hcl] Diarrhea and Other (See Comments)  ?  Stomach upset ?  ? ? ?Antimicrobials this admission: ?Ceftriaxone 3/12 x 1 ? ?Dose adjustments this admission: ?N/a ? ?Microbiology results: ?3/12 BCx >> 1/4 Staph auricularis (likely contaminant) ?3/15 MRSA PCR >> neg ?3/16 BCx >> ? ?Thank you for allowing pharmacy to be a part of this patient?s care. ? ?Georgina Pillion, PharmD, BCPS ?Infectious Diseases Clinical Pharmacist ?08/21/2021 4:09 PM  ? ?**Pharmacist phone directory can now be found on [email or web address] ([email or web address]).  Listed under [facility].  ? ?

## 2021-08-21 NOTE — Consult Note (Addendum)
?  [name]  Total days of antibiotics 0 ? ?Reason for Consult:  Immunocompromised host / pancytopenia  ? ?Referring Physician: Darrick Meigs ? ?Principal Problem: ?  Lactic acidosis ?Active Problems: ?  Hyperlipidemia ?  Hypertension ?  Generalized weakness ?  Rheumatoid arthritis flare (HCC) ?  Depression with anxiety ?  Pancytopenia ([place]) ?  Volume depletion ?  Long-term corticosteroid use ?  Open wound of right hand ?  Adrenal nodule ([place]) ?  Pressure injury of skin ?  High anion gap metabolic acidosis ? ? ? ?HPI: Carrie [name] is a 61 y.o. 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 She was admitted on 3/12 with AMS, abdominal pain, constipation and dehydration. Had similar symptoms 1 month ago. She is oriented x 1. Intermittently answering questions. Occ headache. No sick contact. On admit, there was concern for adrenal insufficiency, given dexamethasone. CXR unremarakable, CT abd findings also unremarkable. NCHCT showing mild cerebral atrophy. Her labs significant for wbc of 3.7, hgb 10 and plt of 90 on admit (down from her baseline of 5/12/200s, respectively) ID asked to weigh in since patient is not improving with mentation. Had low grade fever. Wbc now 3.5 hgb 8.4 and plt 42. Remains encephalopathic. No nuchal rigidity. Due to high steroid for the past 5 months, concern for opportunistic infections. ? ?Past Medical History:  ?Diagnosis Date  ? Depression with anxiety 05/26/2021  ? Dermatomyositis ([place]) 06/23/2021  ? Fatty liver 06/23/2021  ? Hyperlipidemia   ? Hypertension   ? Non-traumatic rhabdomyolysis   ? Pre-syncope 03/26/2021  ? Rheumatoid arthritis flare ([place]) 04/08/2021  ? ?Allergies:  ?Allergies  ?Allergen Reactions  ? Zoloft [Sertraline Hcl] Diarrhea and Other (See Comments)  ?   Stomach upset ?  ? ? ?MEDICATIONS: ? ARIPiprazole  5 mg Oral QHS  ? buPROPion  300 mg Oral Daily  ? Chlorhexidine Gluconate Cloth  6 each Topical Daily  ? escitalopram  20 mg Oral Daily  ? feeding supplement  237 mL Oral BID BM  ? folic acid  1 mg Oral Daily  ? hydrocortisone sod succinate (SOLU-CORTEF) inj  100 mg Intravenous Q12H  ? LORazepam  1 mg Intravenous Once  ? mouth rinse  15 mL Mouth Rinse BID  ? multivitamin with minerals  1 tablet Oral Daily  ? nutrition supplement (JUVEN)  1 packet Oral BID BM  ? pantoprazole  40 mg Oral Daily  ? senna-docusate  1 tablet Oral BID  ? ? ?Social History  ? ?Tobacco Use  ? Smoking status: Former  ?  Types: Cigarettes  ?  Quit date: 02/05/2014  ?  Years since quitting: 7.5  ? Smokeless tobacco: Never  ? Tobacco comments:  ?  1 cig every 4-5 days ---weaning off  ?Vaping Use  ? Vaping Use: Unknown  ?Substance Use Topics  ? Alcohol use: Not Currently  ? Drug use: No  ? ? ?Family History  ?Problem Relation Age of Onset  ? Breast cancer Mother   ?     deceased by 32  ? Breast cancer Sister 60  ? Diabetes Sister   ? Breast cancer Other   ? Coronary artery disease Other   ? Hypertension Other   ? ? ?Review of Systems -  ?Unable to obtain since encephalopathy. She denies  headache ? ?OBJECTIVE: ?Temp:  [97 ?F (36.1 ?C)-99.3 ?F (37.4 ?C)] 97.7 ?F (36.5 ?C) (03/16 1213) ?Pulse Rate:  [86-114] 109 (03/16 1200) ?Resp:  [19-26] 26 (03/16 1200) ?BP: (108-168)/(75-102) 127/75 (03/16 1200) ?SpO2:  [93 %-99 %] 95 % (03/16 1200) ?Physical Exam  ?Constitutional:  oriented to person, place, and time. appears well-developed and well-nourished. No distress.  ?HENT: [place]/AT, PERRLA, no scleral icterus ?Mouth/Throat: Oropharynx is clear and moist. No oropharyngeal exudate.  ?Cardiovascular: Normal rate, regular rhythm and normal heart sounds. Exam reveals no gallop and no friction rub.  ?No murmur heard.  ?Pulmonary/Chest: Effort normal and breath sounds normal. No respiratory distress.  has no  wheezes.  ?Neck = supple, no nuchal rigidity ?Abdominal: Soft. Bowel sounds are normal.  exhibits no distension. There is no tenderness.  ?Lymphadenopathy: no cervical adenopathy. No axillary adenopathy ?Neurological: alert and oriented to person, place, and time.  ?Skin: Skin is warm and dry. No rash noted. No erythema.  ?Psychiatric: a normal mood and affect.  behavior is normal.  ? ? ?LABS: ?Results for orders placed or performed during the hospital encounter of 08/28/2021 (from the past 48 hour(s))  ?Glucose, capillary     Status: None  ? Collection Time: 08/19/21  4:06 PM  ?Result Value Ref Range  ? Glucose-Capillary 86 70 - 99 mg/dL  ?  Comment: Glucose reference range applies only to samples taken after fasting for at least 8 hours.  ?Glucose, capillary     Status: None  ? Collection Time: 08/19/21  8:41 PM  ?Result Value Ref Range  ? Glucose-Capillary 86 70 - 99 mg/dL  ?  Comment: Glucose reference range applies only to samples taken after fasting for at least 8 hours.  ?CBC     Status: Abnormal  ? Collection Time: 08/20/21  5:36 AM  ?Result Value Ref Range  ? WBC 3.0 (L) 4.0 - 10.5 K/uL  ? RBC 2.98 (L) 3.87 - 5.11 MIL/uL  ? Hemoglobin 8.9 (L) 12.0 - 15.0 g/dL  ? HCT 26.5 (L) 36.0 - 46.0 %  ? MCV 88.9 80.0 - 100.0 fL  ? MCH 29.9 26.0 - 34.0 pg  ? MCHC 33.6 30.0 - 36.0 g/dL  ? RDW 17.2 (H) 11.5 - 15.5 %  ? Platelets 55 (L) 150 - 400 K/uL  ?  Comment: Immature Platelet Fraction may be ?clinically indicated, consider ?ordering this additional test ?[id number] ?  ? nRBC 1.0 (H) 0.0 - 0.2 %  ?  Comment: Performed at [facility], [place] [address], [place], [place] [address]  ?Basic metabolic panel     Status: Abnormal  ? Collection Time: 08/20/21  5:36 AM  ?Result Value Ref Range  ? Sodium 135 135 - 145 mmol/L  ? Potassium 3.8 3.5 - 5.1 mmol/L  ? Chloride 107 98 - 111 mmol/L  ? CO2 19 (L) 22 - 32 mmol/L  ? Glucose, Bld 60 (L) 70 - 99 mg/dL  ?  Comment: Glucose reference range applies only to samples  taken after fasting for at least 8 hours.  ? BUN 17 8 - 23 mg/dL  ? Creatinine, Ser 0.80 0.44 - 1.00 mg/dL  ? Calcium 8.8 (L) 8.9 - 10.3 mg/dL  ? GFR, Estimated >60 >60 mL/min  ?  Comment: (NOTE) ?Calculated using the CKD-EPI Creatinine Equation (2021) ?  ? Anion gap 9 5 - 15  ?  Comment: Performed at [facility], [place] [address], [place],  [address]  ?Glucose, capillary  Status: Abnormal  ? Collection Time: 08/20/21  8:08 AM  ?Result Value Ref Range  ? Glucose-Capillary 54 (L) 70 - 99 mg/dL  ?  Comment: Glucose reference range applies only to samples taken after fasting for at least 8 hours.  ?Glucose, capillary     Status: Abnormal  ? Collection Time: 08/20/21  8:47 AM  ?Result Value Ref Range  ? Glucose-Capillary 152 (H) 70 - 99 mg/dL  ?  Comment: Glucose reference range applies only to samples taken after fasting for at least 8 hours.  ?Cortisol, Random     Status: None  ? Collection Time: 08/20/21 10:02 AM  ?Result Value Ref Range  ? Cortisol, Plasma 26.4 ug/dL  ?  Comment: (NOTE) ?AM    6.7 - 22.6 ug/dL ?PM   <10.0       ug/dL ?Performed at [facility], [place] [address], [place], [place] ?[address] ?  ?Glucose, capillary     Status: Abnormal  ? Collection Time: 08/20/21 11:04 AM  ?Result Value Ref Range  ? Glucose-Capillary 57 (L) 70 - 99 mg/dL  ?  Comment: Glucose reference range applies only to samples taken after fasting for at least 8 hours.  ?Glucose, capillary     Status: Abnormal  ? Collection Time: 08/20/21 11:22 AM  ?Result Value Ref Range  ? Glucose-Capillary 141 (H) 70 - 99 mg/dL  ?  Comment: Glucose reference range applies only to samples taken after fasting for at least 8 hours.  ?Glucose, capillary     Status: None  ? Collection Time: 08/20/21 12:13 PM  ?Result Value Ref Range  ? Glucose-Capillary 95 70 - 99 mg/dL  ?  Comment: Glucose reference range applies only to samples taken after fasting for at least 8 hours.  ?Glucose, capillary     Status: None  ?  Collection Time: 08/20/21  1:00 PM  ?Result Value Ref Range  ? Glucose-Capillary 92 70 - 99 mg/dL  ?  Comment: Glucose reference range applies only to samples taken after fasting for at least 8 hours.  ?Glucose, cap

## 2021-08-21 NOTE — Progress Notes (Signed)
EEG complete - results pending. Difficult hookup due to patient mvt, moaning/groaning, and arching against the tech ? ?

## 2021-08-21 NOTE — Progress Notes (Signed)
Speech Language Pathology Treatment: Dysphagia  ?Patient Details ?Name: Carrie [name] ?MRN: [id number] ?DOB: 30-Jun-1960 ?Today's Date: 08/21/2021 ?Time: 1220-1240 ?SLP Time Calculation (min) (ACUTE ONLY): 20 min ? ?Assessment / Plan / Recommendation ?Clinical Impression ? Pt seen at bedside to assess tolerance of PO trials. She was awake and responsive, but moaning and calling out throughout the session. Pt had been made NPO due to decline in status. Pt's niece was present during this session. Pt refused PO presentations from SLP, but accepted multiple boluses of vanilla ice cream given by her niece. Pt appeared to tolerate trials well, without overt s/s aspiration or obvious oral issues. SLP will continue to follow per POC. ?  ?HPI HPI: Patient is a 61 y.o. female with PMH: depression, anxiety, HLD, HTN, nontraumatic rhabdomyolysis, presyncope, history of rheumatoid arthritis who is brought to the emergency department for the third time in the past 12 days due to altered mental status, abdominal pain, constipation for several days, decreased oral intake with dehydration. She was in the inpatient rehab unit of [facility] back in November of 2022 after fall and LOC.  She unfortunately had cognitive decline and at time of discharge home was requiring mod-maxA. During this current admission, CT abdomen/pelvis did not show any acute findings to explain patient's symptoms. CT Head was negative for acute intracranial abnormality. CXR negative for acute chest disease or interval changes and both lungs were clear. ?  ?   ?SLP Plan ? Continue with current plan of care ? ?  ?  ?Recommendations for follow up therapy are one component of a multi-disciplinary discharge planning process, led by the attending physician.  Recommendations may be updated based on patient status, additional functional criteria and insurance authorization. ?  ? ?Recommendations  ?Diet recommendations: Dysphagia 1 (puree) ?Medication Administration: Crushed with  puree ?Supervision: Full supervision/cueing for compensatory strategies;Staff to assist with self feeding ?Compensations: Slow rate;Small sips/bites ?Postural Changes and/or Swallow Maneuvers: Seated upright 90 degrees  ?   ?    ?   ? ? ? ? Oral Care Recommendations: Oral care BID;Staff/trained caregiver to provide oral care ?Follow Up Recommendations: Home health SLP ?Assistance recommended at discharge: Frequent or constant Supervision/Assistance ?SLP Visit Diagnosis: Dysphagia, unspecified (R13.10) ?Plan: Continue with current plan of care ? ? ? ? ?  ?  ? ?Carrie [name] B. Carrie [name], MSP, CCC-SLP ?Speech Language Pathologist ?Office: [phone] ? ?Carrie [name] ?08/21/2021, 1:01 PM ?

## 2021-08-22 ENCOUNTER — Inpatient Hospital Stay (HOSPITAL_COMMUNITY): Payer: Federal, State, Local not specified - PPO

## 2021-08-22 ENCOUNTER — Inpatient Hospital Stay: Payer: Self-pay

## 2021-08-22 DIAGNOSIS — R4182 Altered mental status, unspecified: Secondary | ICD-10-CM | POA: Diagnosis not present

## 2021-08-22 DIAGNOSIS — E278 Other specified disorders of adrenal gland: Secondary | ICD-10-CM | POA: Diagnosis not present

## 2021-08-22 DIAGNOSIS — E872 Acidosis, unspecified: Secondary | ICD-10-CM | POA: Diagnosis not present

## 2021-08-22 DIAGNOSIS — R109 Unspecified abdominal pain: Secondary | ICD-10-CM | POA: Diagnosis not present

## 2021-08-22 DIAGNOSIS — E8729 Other acidosis: Secondary | ICD-10-CM | POA: Diagnosis not present

## 2021-08-22 LAB — COMPREHENSIVE METABOLIC PANEL
ALT: 56 U/L — ABNORMAL HIGH (ref 0–44)
ALT: 62 U/L — ABNORMAL HIGH (ref 0–44)
AST: 142 U/L — ABNORMAL HIGH (ref 15–41)
AST: 132 U/L — ABNORMAL HIGH (ref 15–41)
Albumin: 2 g/dL — ABNORMAL LOW (ref 3.5–5.0)
Albumin: 1.8 g/dL — ABNORMAL LOW (ref 3.5–5.0)
Alkaline Phosphatase: 197 U/L — ABNORMAL HIGH (ref 38–126)
Alkaline Phosphatase: 208 U/L — ABNORMAL HIGH (ref 38–126)
Anion gap: 9 (ref 5–15)
Anion gap: 9 (ref 5–15)
BUN: 15 mg/dL (ref 8–23)
BUN: 13 mg/dL (ref 8–23)
CO2: 18 mmol/L — ABNORMAL LOW (ref 22–32)
CO2: 16 mmol/L — ABNORMAL LOW (ref 22–32)
Calcium: 7.8 mg/dL — ABNORMAL LOW (ref 8.9–10.3)
Calcium: 7.2 mg/dL — ABNORMAL LOW (ref 8.9–10.3)
Chloride: 99 mmol/L (ref 98–111)
Chloride: 102 mmol/L (ref 98–111)
Creatinine, Ser: 0.86 mg/dL (ref 0.44–1.00)
Creatinine, Ser: 0.79 mg/dL (ref 0.44–1.00)
GFR, Estimated: >60 mL/min (ref >60)
GFR, Estimated: >60 mL/min (ref >60)
Glucose, Bld: 209 mg/dL — ABNORMAL HIGH (ref 70–99)
Glucose, Bld: 180 mg/dL — ABNORMAL HIGH (ref 70–99)
Potassium: 3.1 mmol/L — ABNORMAL LOW (ref 3.5–5.1)
Potassium: 3.9 mmol/L (ref 3.5–5.1)
Sodium: 126 mmol/L — ABNORMAL LOW (ref 135–145)
Sodium: 127 mmol/L — ABNORMAL LOW (ref 135–145)
Total Bilirubin: 1 mg/dL (ref 0.3–1.2)
Total Bilirubin: 1.2 mg/dL (ref 0.3–1.2)
Total Protein: 3.6 g/dL — ABNORMAL LOW (ref 6.5–8.1)
Total Protein: 3.6 g/dL — ABNORMAL LOW (ref 6.5–8.1)

## 2021-08-22 LAB — CBC
HCT: 22.6 % — ABNORMAL LOW (ref 36.0–46.0)
HCT: 24.4 % — ABNORMAL LOW (ref 36.0–46.0)
Hemoglobin: 7.7 g/dL — ABNORMAL LOW (ref 12.0–15.0)
Hemoglobin: 7.9 g/dL — ABNORMAL LOW (ref 12.0–15.0)
MCH: 29.4 pg (ref 26.0–34.0)
MCH: 29.9 pg (ref 26.0–34.0)
MCHC: 34.1 g/dL (ref 30.0–36.0)
MCHC: 32.4 g/dL (ref 30.0–36.0)
MCV: 86.3 fL (ref 80.0–100.0)
MCV: 92.4 fL (ref 80.0–100.0)
Platelets: 38 10*3/uL — ABNORMAL LOW (ref 150–400)
Platelets: 45 10*3/uL — ABNORMAL LOW (ref 150–400)
RBC: 2.62 MIL/uL — ABNORMAL LOW (ref 3.87–5.11)
RBC: 2.64 MIL/uL — ABNORMAL LOW (ref 3.87–5.11)
RDW: 17.2 % — ABNORMAL HIGH (ref 11.5–15.5)
RDW: 17.9 % — ABNORMAL HIGH (ref 11.5–15.5)
WBC: 3.7 10*3/uL — ABNORMAL LOW (ref 4.0–10.5)
WBC: 7.1 10*3/uL (ref 4.0–10.5)
nRBC: 0.8 % — ABNORMAL HIGH (ref 0.0–0.2)
nRBC: 2.1 % — ABNORMAL HIGH (ref 0.0–0.2)

## 2021-08-22 LAB — ABO/RH: ABO/RH(D): A NEG

## 2021-08-22 LAB — GLUCOSE, CAPILLARY
Glucose-Capillary: 110 mg/dL — ABNORMAL HIGH (ref 70–99)
Glucose-Capillary: 159 mg/dL — ABNORMAL HIGH (ref 70–99)
Glucose-Capillary: 167 mg/dL — ABNORMAL HIGH (ref 70–99)
Glucose-Capillary: 167 mg/dL — ABNORMAL HIGH (ref 70–99)

## 2021-08-22 LAB — BLOOD GAS, ARTERIAL
Acid-base deficit: 8 mmol/L — ABNORMAL HIGH (ref 0.0–2.0)
Bicarbonate: 15.4 mmol/L — ABNORMAL LOW (ref 20.0–28.0)
O2 Saturation: 99.3 %
Patient temperature: 36.5
pCO2 arterial: 25 mmHg — ABNORMAL LOW (ref 32–48)
pH, Arterial: 7.39 (ref 7.35–7.45)
pO2, Arterial: 323 mmHg — ABNORMAL HIGH (ref 83–108)

## 2021-08-22 LAB — CULTURE, BLOOD (SINGLE)
Culture: NO GROWTH
Special Requests: ADEQUATE

## 2021-08-22 LAB — HEPATITIS PANEL, ACUTE
HCV Ab: NONREACTIVE
Hep A IgM: NONREACTIVE
Hep B C IgM: NONREACTIVE
Hepatitis B Surface Ag: NONREACTIVE

## 2021-08-22 LAB — DIC (DISSEMINATED INTRAVASCULAR COAGULATION)PANEL
D-Dimer, Quant: 7.68 ug/mL-FEU — ABNORMAL HIGH (ref 0.00–0.50)
Fibrinogen: 90 mg/dL — CL (ref 210–475)
INR: 1.2 (ref 0.8–1.2)
Platelets: 41 10*3/uL — ABNORMAL LOW (ref 150–400)
Prothrombin Time: 15.7 seconds — ABNORMAL HIGH (ref 11.4–15.2)
aPTT: 41 seconds — ABNORMAL HIGH (ref 24–36)

## 2021-08-22 LAB — MAGNESIUM
Magnesium: 1.6 mg/dL — ABNORMAL LOW (ref 1.7–2.4)
Magnesium: 2.2 mg/dL (ref 1.7–2.4)

## 2021-08-22 LAB — PREPARE RBC (CROSSMATCH)

## 2021-08-22 IMAGING — CT CT BIOPSY AND ASPIRATION BONE MARROW
1 of 2 series · 15 of 28 positions shown, 19 images · non-contrast
Comparison: none

INDICATION: Pancytopenia of uncertain etiology. Please perform CT-guided bone
marrow biopsy for tissue diagnostic purposes.

[Series 2: i-spiral 5.0 br40 · axial · 0.98mm/px · z∈[-784,-696]mm · 15 of 29 slices shown, 19 images]
[im 2/29  mediastinal]
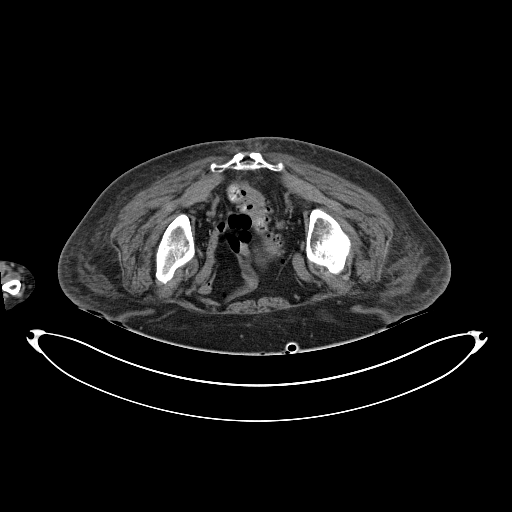
[im 2/29  lung]
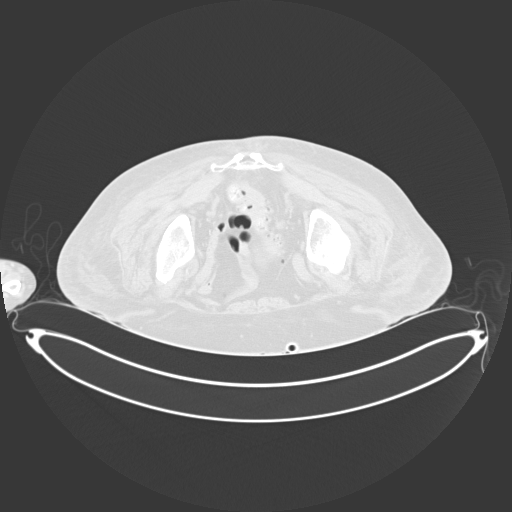
[im 4/29  lung]
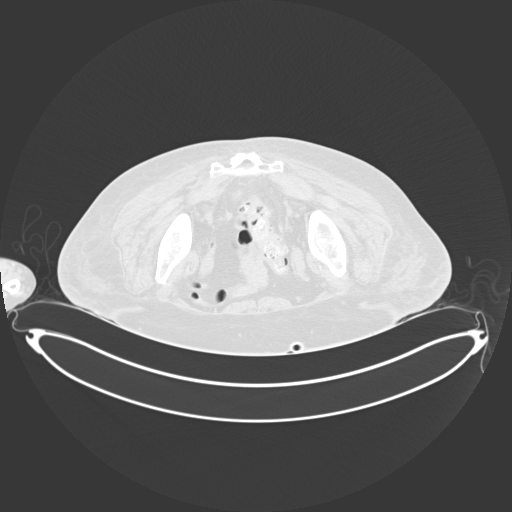
[im 5/29  lung]
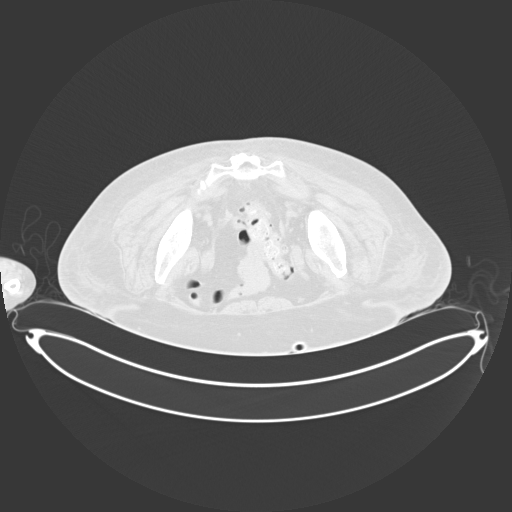
[im 8/29  lung]
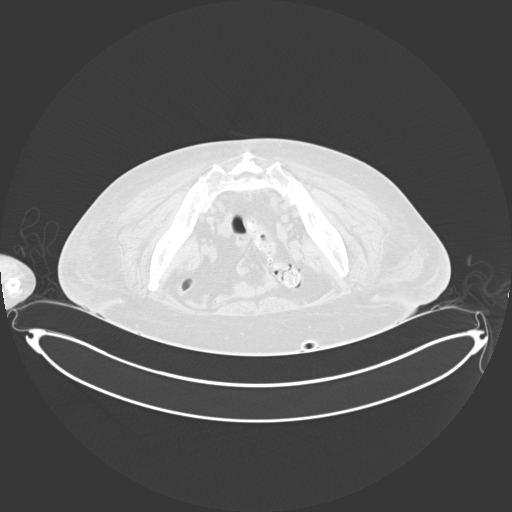
[im 9/29  mediastinal]
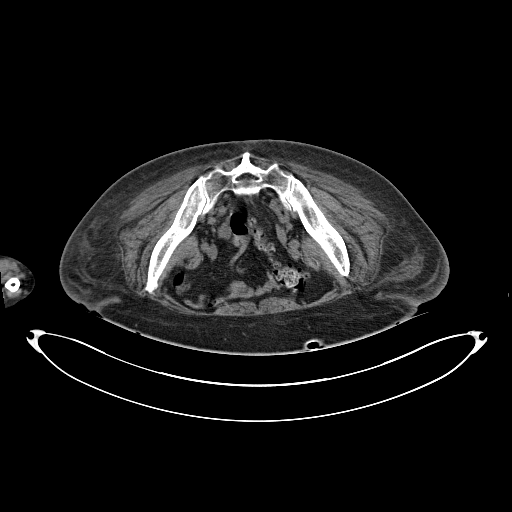
[im 9/29  lung]
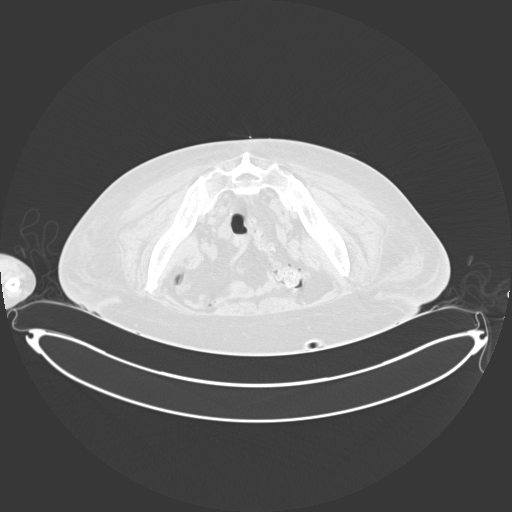
[im 11/29  lung]
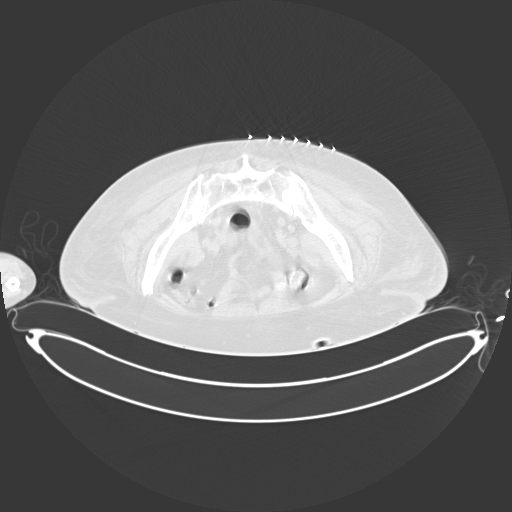
[im 12/29  lung]
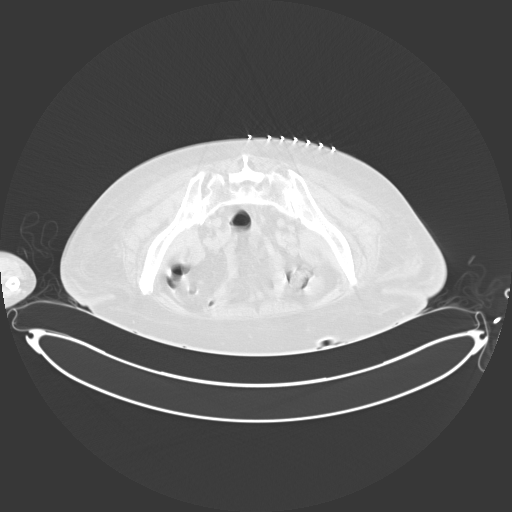
[im 15/29  lung]
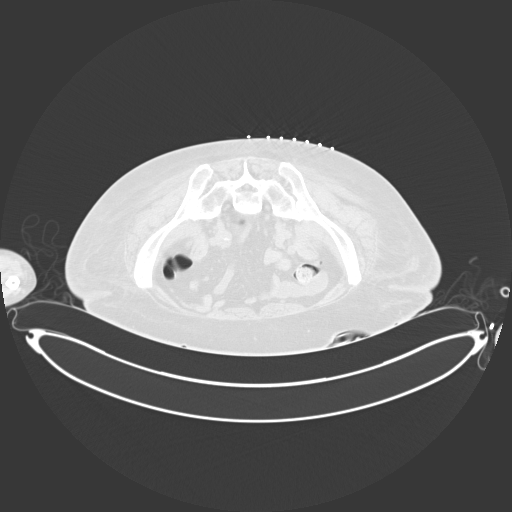
[im 17/29  mediastinal]
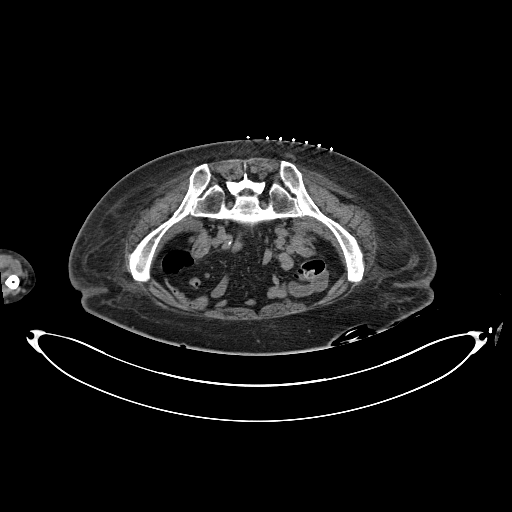
[im 17/29  lung]
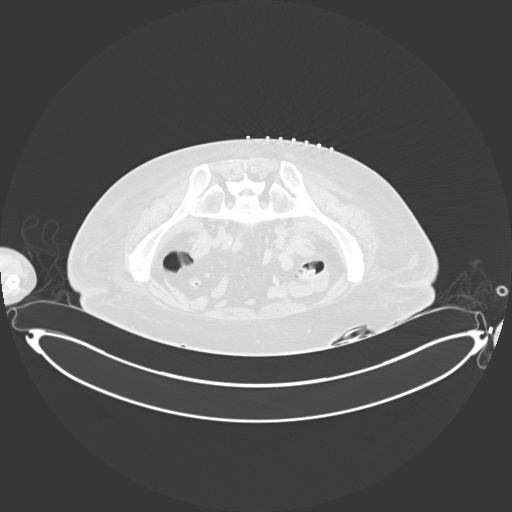
[im 18/29  lung]
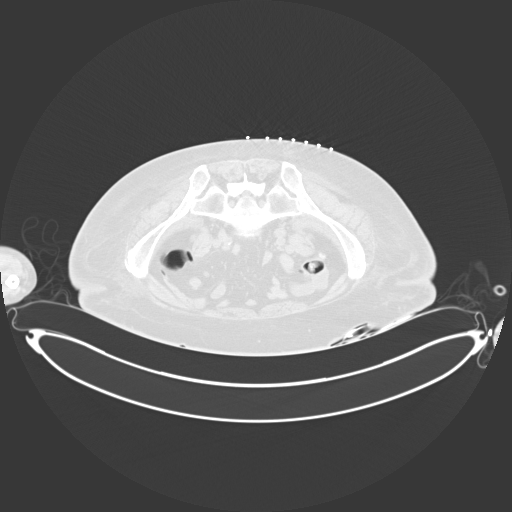
[im 20/29  lung]
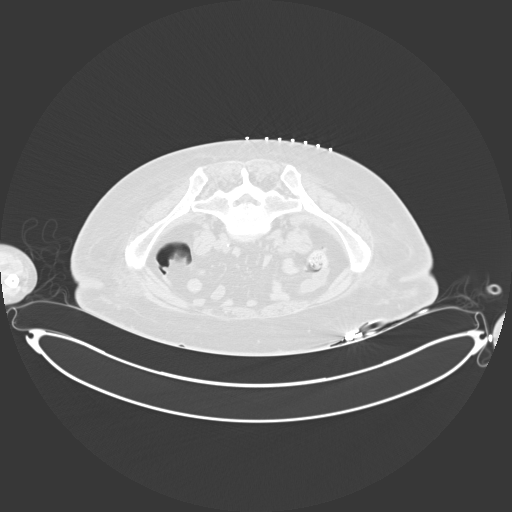
[im 22/29  lung]
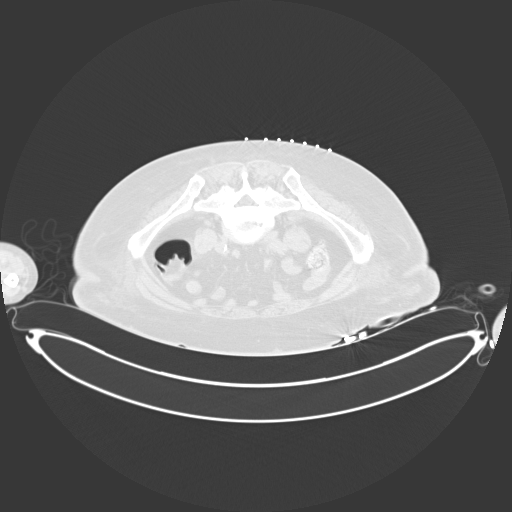
[im 24/29  mediastinal]
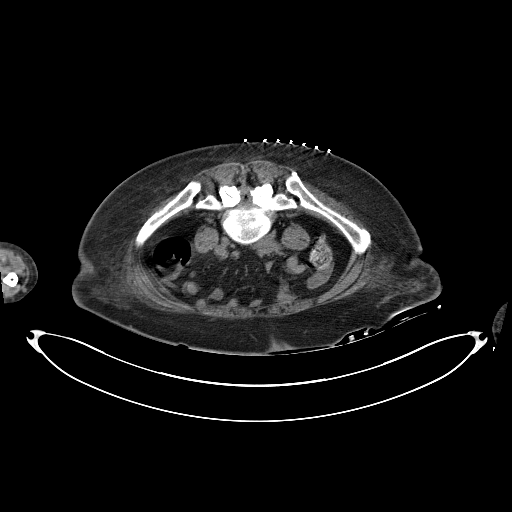
[im 24/29  lung]
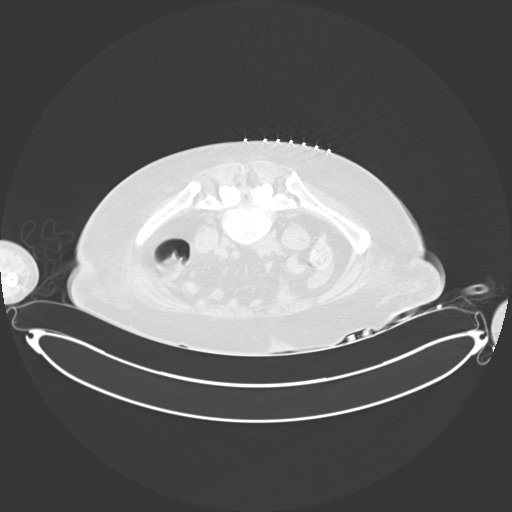
[im 25/29  lung]
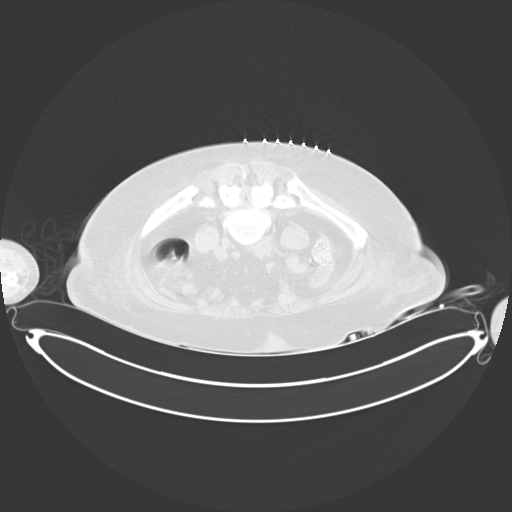
[im 27/29  lung]
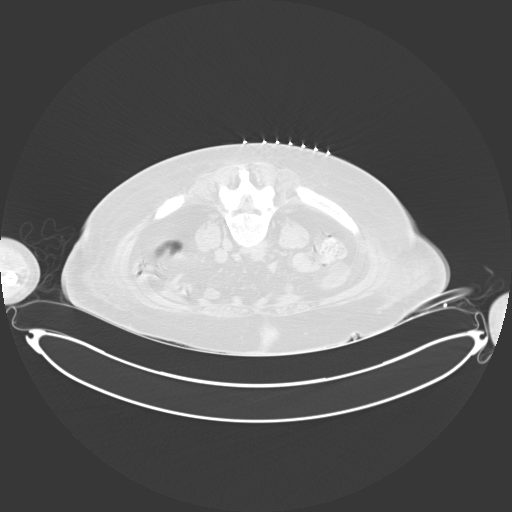

[15 of 28 positions shown; findings below may reference images not displayed]

EXAM:
CT-GUIDED BONE MARROW BIOPSY AND ASPIRATION

MEDICATIONS:
None

ANESTHESIA/SEDATION:
Moderate (conscious) sedation was employed during this procedure as
administered by the [REDACTED].

A total of Versed 2 mg and Fentanyl 100 mcg was administered
intravenously.

Moderate Sedation Time: 10 minutes. The patient's level of
consciousness and vital signs were monitored continuously by
radiology nursing throughout the procedure under my direct
supervision.

COMPLICATIONS:
None immediate.

PROCEDURE:
Informed consent was obtained from the patient's family following an
explanation of the procedure, risks, benefits and alternatives. The
patient's family understands, agrees and consents for the procedure.
All questions were addressed. A time out was performed prior to the
initiation of the procedure.

The patient was positioned prone and non-contrast localization CT
was performed of the pelvis to demonstrate the iliac marrow spaces.
The operative site was prepped and draped in the usual sterile
fashion.

Under sterile conditions and local anesthesia, a 22 gauge spinal
needle was utilized for procedural planning. Next, an 11 gauge
coaxial bone biopsy needle was advanced into the left iliac marrow
space. Needle position was confirmed with CT imaging. Initially, a
bone marrow aspiration was performed. Next, a bone marrow biopsy was
obtained with the 11 gauge outer bone marrow device. Samples were
prepared with the cytotechnologist and deemed adequate. The needle
was removed and superficial hemostasis was obtained with manual
compression. A dressing was applied. The patient tolerated the
procedure well without immediate post procedural complication.
IMPRESSION: Successful CT guided left iliac bone marrow aspiration and core
biopsy.

## 2021-08-22 IMAGING — DX DG CHEST 1V PORT
1 series · 1 of 1 positions shown · non-contrast
Comparison: Chest x-ray [DATE]

CLINICAL DATA: Seizure

EXAM:
PORTABLE CHEST 1 VIEW.  Patient is rotated.

[chest ap]
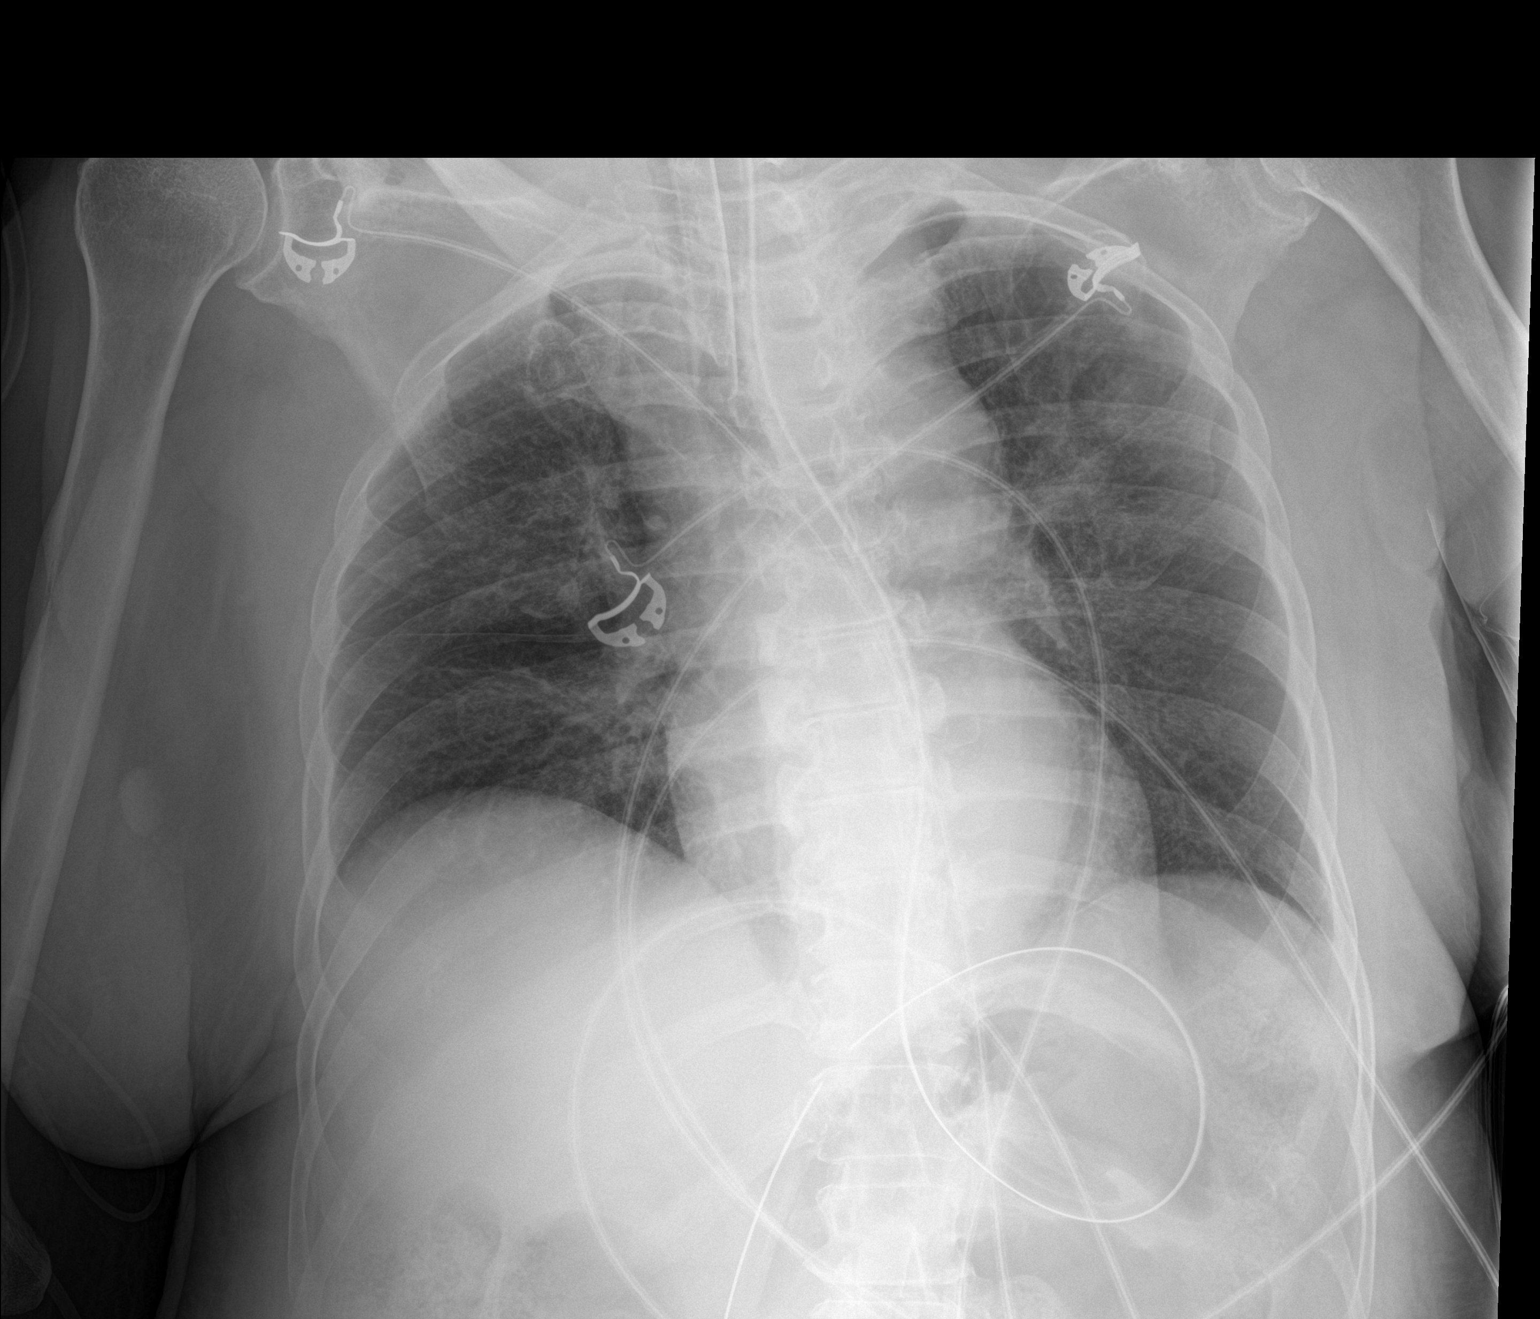

[1 of 1 positions shown; findings below may reference images not displayed]

FINDINGS: Endotracheal tube terminates 3.5 cm above the carina. Enteric tube
courses below the hemidiaphragm with tip and side port overlying the
expected region of the gastric lumen. The enteric tube is looped
once within the stomach.

The heart and mediastinal contours are unchanged given patient
rotation.

Low lung volumes. Left apical hazy patchy airspace opacity. No
pulmonary edema. No pleural effusion. No pneumothorax.

No acute osseous abnormality.
IMPRESSION: 1. Enteric tube looped once within the stomach. Consider retraction
by 5 cm.
2. Endotracheal tube in good position.
3. Left apical hazy patchy airspace opacity.

## 2021-08-22 IMAGING — CT CT BIOPSY
1 of 2 series · 15 of 28 positions shown, 19 images · non-contrast
Comparison: none

INDICATION: Pancytopenia of uncertain etiology. Please perform CT-guided bone
marrow biopsy for tissue diagnostic purposes.

[Series 2: i-spiral 5.0 br40 · axial · 0.98mm/px · z∈[-784,-696]mm · 15 of 29 slices shown, 19 images]
[im 2/29  mediastinal]
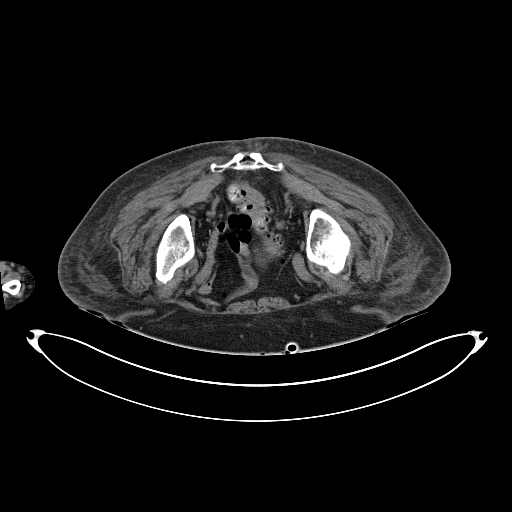
[im 2/29  lung]
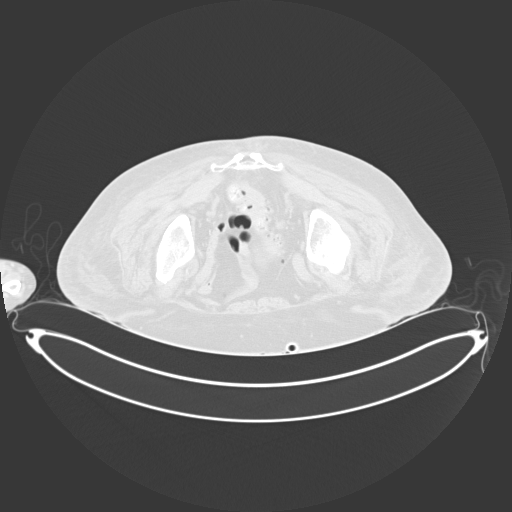
[im 4/29  lung]
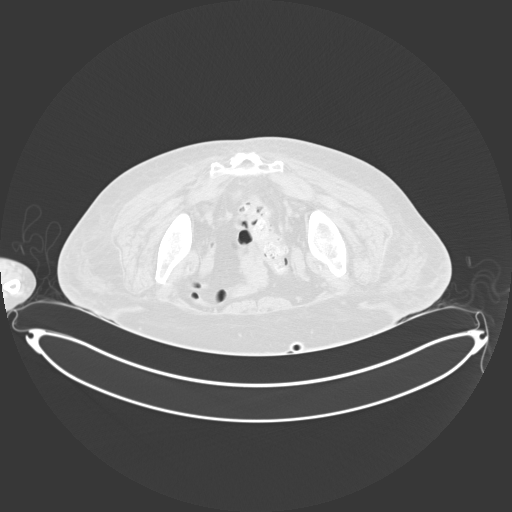
[im 5/29  lung]
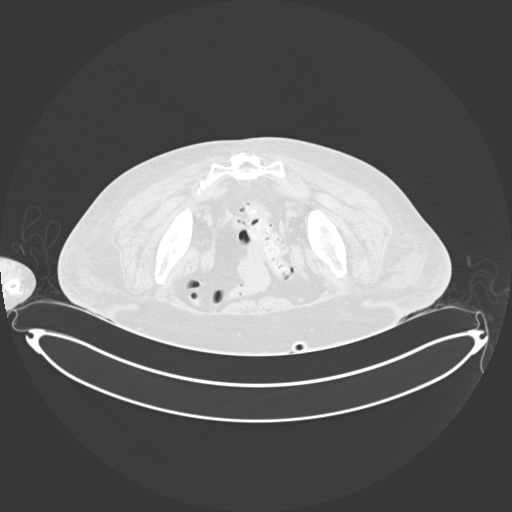
[im 8/29  lung]
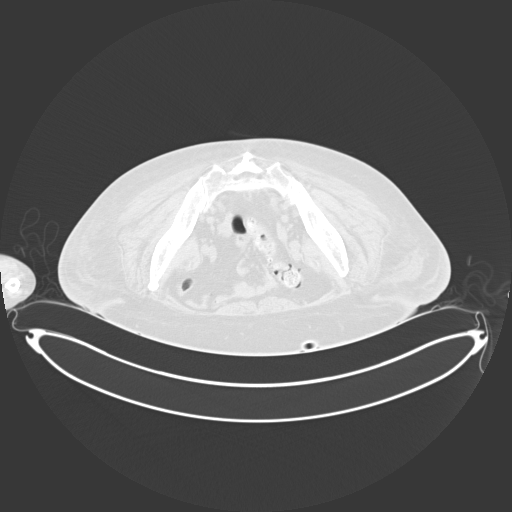
[im 9/29  mediastinal]
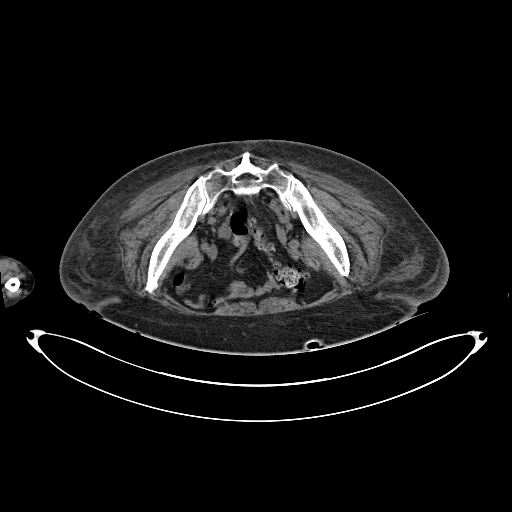
[im 9/29  lung]
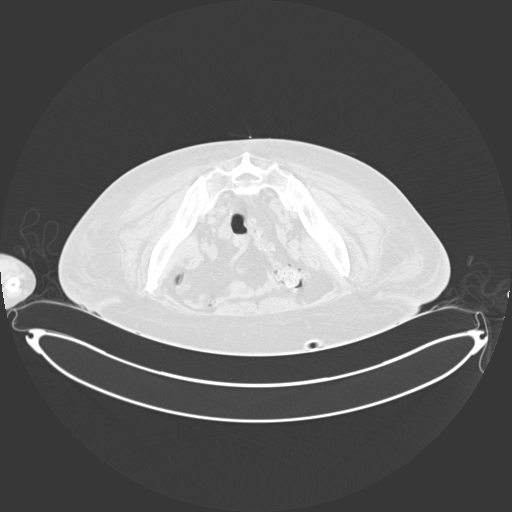
[im 11/29  lung]
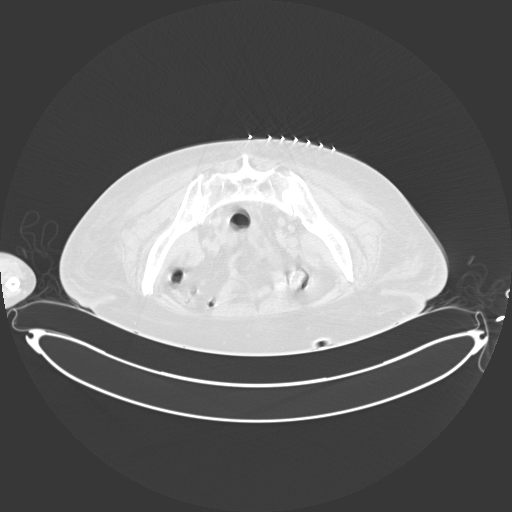
[im 12/29  lung]
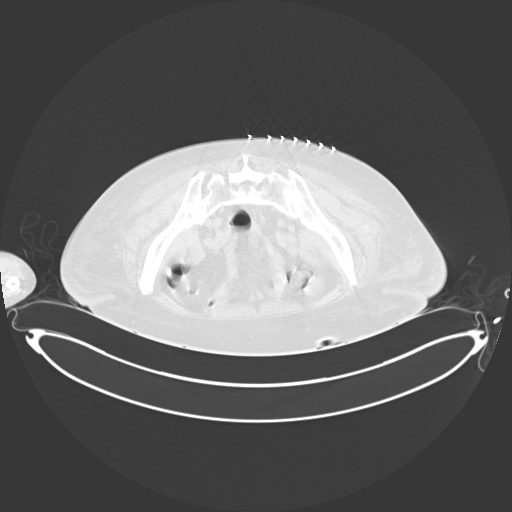
[im 15/29  lung]
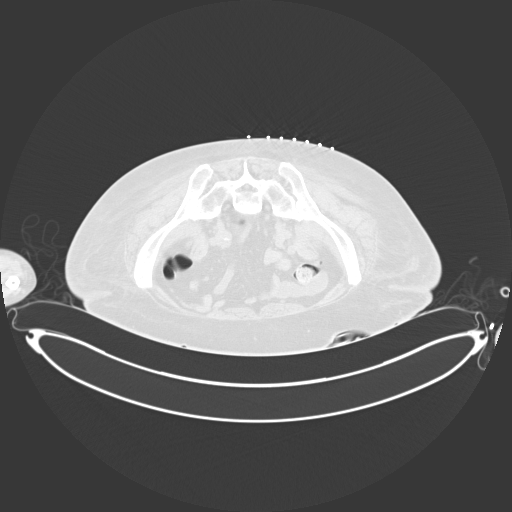
[im 17/29  mediastinal]
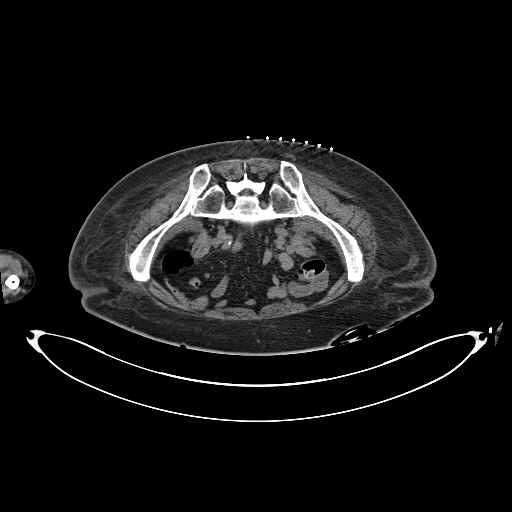
[im 17/29  lung]
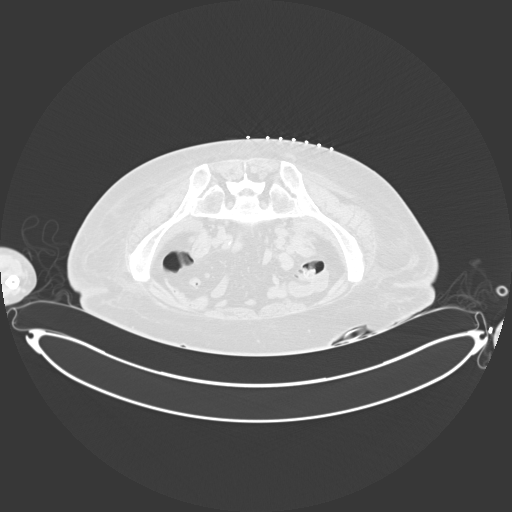
[im 18/29  lung]
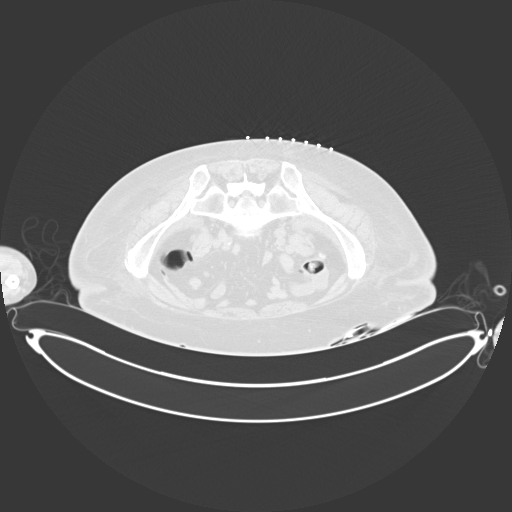
[im 20/29  lung]
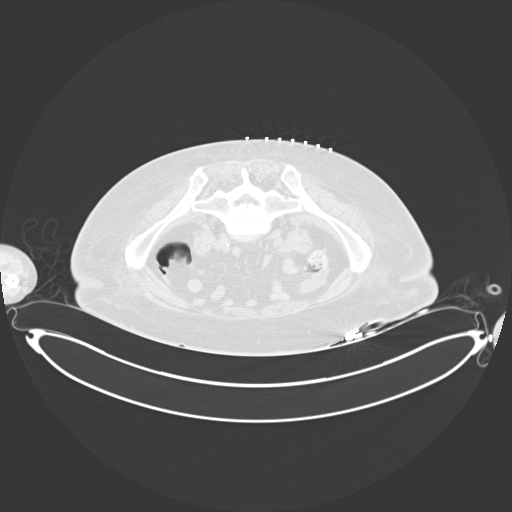
[im 22/29  lung]
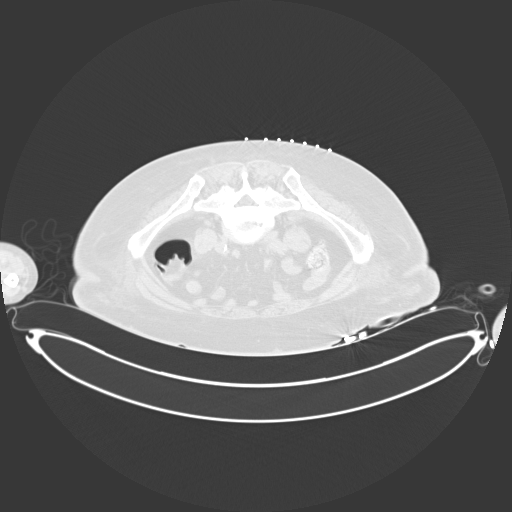
[im 24/29  mediastinal]
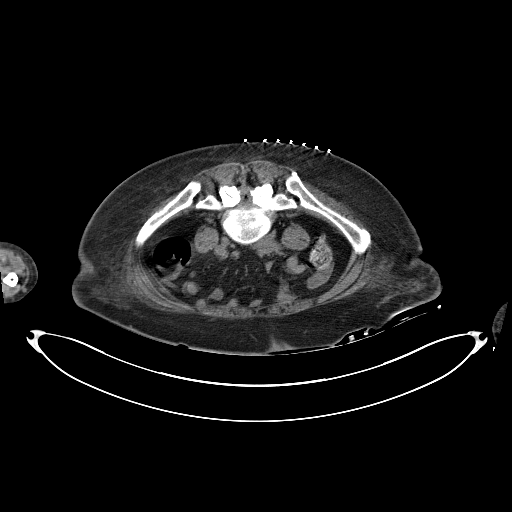
[im 24/29  lung]
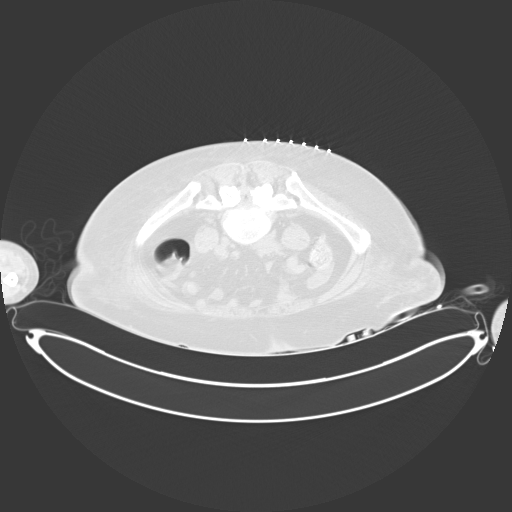
[im 25/29  lung]
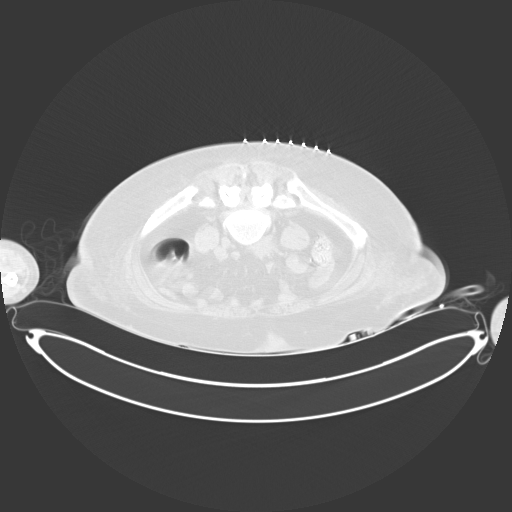
[im 27/29  lung]
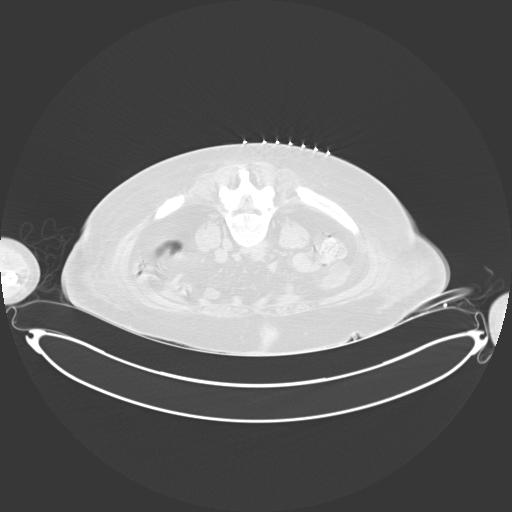

[15 of 28 positions shown; findings below may reference images not displayed]

EXAM:
CT-GUIDED BONE MARROW BIOPSY AND ASPIRATION

MEDICATIONS:
None

ANESTHESIA/SEDATION:
Moderate (conscious) sedation was employed during this procedure as
administered by the [REDACTED].

A total of Versed 2 mg and Fentanyl 100 mcg was administered
intravenously.

Moderate Sedation Time: 10 minutes. The patient's level of
consciousness and vital signs were monitored continuously by
radiology nursing throughout the procedure under my direct
supervision.

COMPLICATIONS:
None immediate.

PROCEDURE:
Informed consent was obtained from the patient's family following an
explanation of the procedure, risks, benefits and alternatives. The
patient's family understands, agrees and consents for the procedure.
All questions were addressed. A time out was performed prior to the
initiation of the procedure.

The patient was positioned prone and non-contrast localization CT
was performed of the pelvis to demonstrate the iliac marrow spaces.
The operative site was prepped and draped in the usual sterile
fashion.

Under sterile conditions and local anesthesia, a 22 gauge spinal
needle was utilized for procedural planning. Next, an 11 gauge
coaxial bone biopsy needle was advanced into the left iliac marrow
space. Needle position was confirmed with CT imaging. Initially, a
bone marrow aspiration was performed. Next, a bone marrow biopsy was
obtained with the 11 gauge outer bone marrow device. Samples were
prepared with the cytotechnologist and deemed adequate. The needle
was removed and superficial hemostasis was obtained with manual
compression. A dressing was applied. The patient tolerated the
procedure well without immediate post procedural complication.
IMPRESSION: Successful CT guided left iliac bone marrow aspiration and core
biopsy.

## 2021-08-22 MED ORDER — PHENYLEPHRINE 40 MCG/ML (10ML) SYRINGE FOR IV PUSH (FOR BLOOD PRESSURE SUPPORT)
PREFILLED_SYRINGE | INTRAVENOUS | Status: AC
Start: 2021-08-22 — End: 2021-08-23
  Filled 2021-08-22: qty 10

## 2021-08-22 MED ORDER — NOREPINEPHRINE 4 MG/250ML-% IV SOLN
INTRAVENOUS | Status: AC
Start: 2021-08-22 — End: 2021-08-23
  Filled 2021-08-22: qty 250

## 2021-08-22 MED ORDER — DEXTROSE-NACL 5-0.9 % IV SOLN: INTRAVENOUS | Status: DC

## 2021-08-22 MED ORDER — POTASSIUM CHLORIDE 10 MEQ/100ML IV SOLN
10.0000 meq | INTRAVENOUS | Status: AC
  Administered 2021-08-22 (×3): 10 meq via INTRAVENOUS
  Filled 2021-08-22 (×2): qty 100

## 2021-08-22 MED ORDER — LORAZEPAM 2 MG/ML IJ SOLN
1.0000 mg | Freq: Once | INTRAMUSCULAR | Status: AC
  Administered 2021-08-22: 1 mg via INTRAVENOUS
  Filled 2021-08-22: qty 1

## 2021-08-22 MED ORDER — LIDOCAINE HCL (PF) 1 % IJ SOLN
INTRAMUSCULAR | Status: AC | PRN
  Administered 2021-08-22: 15 mL

## 2021-08-22 MED ORDER — FENTANYL BOLUS VIA INFUSION
50.0000 ug | INTRAVENOUS | Status: DC | PRN
  Administered 2021-08-22 – 2021-08-24 (×6): 50 ug via INTRAVENOUS
  Administered 2021-08-27 – 2021-08-29 (×10): 100 ug via INTRAVENOUS
  Filled 2021-08-22: qty 100

## 2021-08-22 MED ORDER — NALOXONE HCL 0.4 MG/ML IJ SOLN
INTRAMUSCULAR | Status: AC
  Filled 2021-08-22: qty 1

## 2021-08-22 MED ORDER — MIDAZOLAM HCL 2 MG/2ML IJ SOLN
INTRAMUSCULAR | Status: AC
  Administered 2021-08-22: 2 mg via INTRAVENOUS
  Filled 2021-08-22: qty 2

## 2021-08-22 MED ORDER — MIDAZOLAM HCL 2 MG/2ML IJ SOLN
INTRAMUSCULAR | Status: AC | PRN
  Administered 2021-08-22 (×2): 1 mg via INTRAVENOUS

## 2021-08-22 MED ORDER — NOREPINEPHRINE 4 MG/250ML-% IV SOLN: 0.0000 ug/min | INTRAVENOUS | Status: DC

## 2021-08-22 MED ORDER — FLUMAZENIL 0.5 MG/5ML IV SOLN
INTRAVENOUS | Status: AC
  Filled 2021-08-22: qty 5

## 2021-08-22 MED ORDER — FENTANYL 2500MCG IN NS 250ML (10MCG/ML) PREMIX INFUSION: 0.0000 ug/h | INTRAVENOUS | Status: DC

## 2021-08-22 MED ORDER — MIDAZOLAM HCL 2 MG/2ML IJ SOLN: 2.0000 mg | Freq: Once | INTRAMUSCULAR | Status: AC

## 2021-08-22 MED ORDER — FENTANYL CITRATE (PF) 100 MCG/2ML IJ SOLN: 50.0000 ug | Freq: Once | INTRAMUSCULAR | Status: DC

## 2021-08-22 MED ORDER — FENTANYL CITRATE (PF) 100 MCG/2ML IJ SOLN
INTRAMUSCULAR | Status: AC | PRN
  Administered 2021-08-22 (×2): 50 ug via INTRAVENOUS

## 2021-08-22 MED ORDER — MIDAZOLAM HCL 2 MG/2ML IJ SOLN: 2.0000 mg | INTRAMUSCULAR | Status: DC | PRN

## 2021-08-22 MED ORDER — FENTANYL 2500MCG IN NS 250ML (10MCG/ML) PREMIX INFUSION
50.0000 ug/h | INTRAVENOUS | Status: DC
  Administered 2021-08-22: 50 ug/h via INTRAVENOUS
  Administered 2021-08-23: 100 ug/h via INTRAVENOUS
  Administered 2021-08-24: 150 ug/h via INTRAVENOUS
  Administered 2021-08-25 – 2021-08-26 (×3): 200 ug/h via INTRAVENOUS
  Administered 2021-08-26: 180 ug/h via INTRAVENOUS
  Administered 2021-08-27: 150 ug/h via INTRAVENOUS
  Administered 2021-08-27: 200 ug/h via INTRAVENOUS
  Administered 2021-08-28: 100 ug/h via INTRAVENOUS
  Administered 2021-08-28 – 2021-08-31 (×6): 200 ug/h via INTRAVENOUS
  Administered 2021-09-01: 75 ug/h via INTRAVENOUS
  Administered 2021-09-01 – 2021-09-02 (×3): 200 ug/h via INTRAVENOUS
  Administered 2021-09-03 – 2021-09-06 (×4): 100 ug/h via INTRAVENOUS
  Administered 2021-09-07: 150 ug/h via INTRAVENOUS
  Administered 2021-09-08 – 2021-09-09 (×4): 200 ug/h via INTRAVENOUS
  Administered 2021-09-10: 100 ug/h via INTRAVENOUS
  Administered 2021-09-11 (×2): 150 ug/h via INTRAVENOUS
  Administered 2021-09-12: 175 ug/h via INTRAVENOUS
  Administered 2021-09-13: 100 ug/h via INTRAVENOUS
  Administered 2021-09-14: 150 ug/h via INTRAVENOUS
  Administered 2021-09-14 – 2021-09-15 (×2): 125 ug/h via INTRAVENOUS
  Administered 2021-09-16 – 2021-09-20 (×8): 200 ug/h via INTRAVENOUS
  Filled 2021-08-22 (×46): qty 250

## 2021-08-22 MED ORDER — MIDAZOLAM HCL 2 MG/2ML IJ SOLN
INTRAMUSCULAR | Status: AC
  Administered 2021-08-22: 2 mg via INTRAMUSCULAR
  Filled 2021-08-22: qty 2

## 2021-08-22 MED ORDER — MIDAZOLAM HCL 2 MG/2ML IJ SOLN
INTRAMUSCULAR | Status: AC
  Filled 2021-08-22: qty 4

## 2021-08-22 MED ORDER — POTASSIUM CHLORIDE 10 MEQ/100ML IV SOLN
INTRAVENOUS | Status: AC
  Filled 2021-08-22: qty 100

## 2021-08-22 MED ORDER — SODIUM CHLORIDE 0.9% IV SOLUTION: Freq: Once | INTRAVENOUS | Status: DC

## 2021-08-22 MED ORDER — MIDAZOLAM HCL 2 MG/2ML IJ SOLN
INTRAMUSCULAR | Status: AC
  Filled 2021-08-22: qty 2

## 2021-08-22 MED ORDER — MIDAZOLAM HCL 2 MG/2ML IJ SOLN
2.0000 mg | INTRAMUSCULAR | Status: AC | PRN
  Administered 2021-08-31 – 2021-09-01 (×3): 2 mg via INTRAVENOUS
  Filled 2021-08-22 (×3): qty 2

## 2021-08-22 MED ORDER — ORAL CARE MOUTH RINSE
15.0000 mL | OROMUCOSAL | Status: DC
  Administered 2021-08-23 – 2021-09-21 (×289): 15 mL via OROMUCOSAL

## 2021-08-22 MED ORDER — NOREPINEPHRINE 4 MG/250ML-% IV SOLN
0.0000 ug/min | INTRAVENOUS | Status: DC
  Administered 2021-08-22: 10 ug/min via INTRAVENOUS
  Administered 2021-08-22: 18 ug/min via INTRAVENOUS
  Administered 2021-08-23: 9 ug/min via INTRAVENOUS
  Administered 2021-08-23: 18 ug/min via INTRAVENOUS
  Administered 2021-08-23: 16 ug/min via INTRAVENOUS
  Filled 2021-08-22 (×3): qty 250

## 2021-08-22 MED ORDER — MAGNESIUM SULFATE 2 GM/50ML IV SOLN
2.0000 g | Freq: Once | INTRAVENOUS | Status: AC
Start: 2021-08-22 — End: 2021-08-22
  Administered 2021-08-22: 2 g via INTRAVENOUS
  Filled 2021-08-22: qty 50

## 2021-08-22 MED ORDER — POLYETHYLENE GLYCOL 3350 17 G PO PACK
17.0000 g | PACK | Freq: Every day | ORAL | Status: DC
  Administered 2021-08-23 – 2021-08-24 (×2): 17 g
  Filled 2021-08-22 (×2): qty 1

## 2021-08-22 MED ORDER — FENTANYL CITRATE (PF) 100 MCG/2ML IJ SOLN
INTRAMUSCULAR | Status: AC
  Filled 2021-08-22: qty 2

## 2021-08-22 MED ORDER — DOCUSATE SODIUM 50 MG/5ML PO LIQD
100.0000 mg | Freq: Two times a day (BID) | ORAL | Status: DC
  Administered 2021-08-23 – 2021-08-24 (×4): 100 mg
  Filled 2021-08-22 (×4): qty 10

## 2021-08-22 MED ORDER — CHLORHEXIDINE GLUCONATE 0.12% ORAL RINSE (MEDLINE KIT)
15.0000 mL | Freq: Two times a day (BID) | OROMUCOSAL | Status: DC
  Administered 2021-08-22 – 2021-09-21 (×61): 15 mL via OROMUCOSAL

## 2021-08-22 MED ORDER — MIDAZOLAM HCL 2 MG/2ML IJ SOLN
2.0000 mg | INTRAMUSCULAR | Status: DC | PRN
  Administered 2021-08-23 – 2021-09-01 (×22): 2 mg via INTRAVENOUS
  Filled 2021-08-22 (×22): qty 2

## 2021-08-22 MED ORDER — SODIUM CHLORIDE 0.9 % IV SOLN
250.0000 mL | INTRAVENOUS | Status: DC
  Administered 2021-08-22: 400 mL via INTRAVENOUS
  Administered 2021-08-26 – 2021-09-15 (×8): 250 mL via INTRAVENOUS

## 2021-08-22 MED ORDER — FENTANYL CITRATE (PF) 100 MCG/2ML IJ SOLN
INTRAMUSCULAR | Status: AC
  Filled 2021-08-22: qty 4

## 2021-08-22 NOTE — Progress Notes (Signed)
Blood consent obtained and placed in chart.

## 2021-08-22 NOTE — Progress Notes (Addendum)
charge RN Neoma Laming , NP Loree Fee , &CCMD Micah Noel were at pts bedside while pt had a witnessed seizure that lasted about 13min per CCMD Whitney. ? ?Respiratory notified and airway was established with intubation. ?Patient noted to be hypotensive by a manual blood pressure of 66/57. ?Pt given 1 Liter normal saline bolus. ?IO access established in [place]. ?Pt given 50 mcg of fetantyl and 1mg  of versed. ?OG tube established by Elta Guadeloupe RN ? ?Repeat blood pressure were still hypotensive with systolic of 77. ?Phenylephrine 400 mcg administered and levophed iniated at 34mcg per MD Micah Noel. ? ?Pt BP stabilized at 113/81 now. ? ?All orders were given verbal with CCMD Micah Noel and NP Whitney H. At bedside. ? ?MD Darrick Meigs, NP Carlos Levering Notified and aware of pts current status.  ?

## 2021-08-22 NOTE — Progress Notes (Addendum)
HEMATOLOGY-ONCOLOGY PROGRESS NOTE ? ?ASSESSMENT AND PLAN: ?61 yo female  ?  ?AMS, ?  Metabolic encephalopathy ?Thrombocytopenia and normocytic anemia  ?AKI and hyponatremia secondary to dehydration  ?Hypertension ?Rheumatoid arthritis ?Depression and anxiety ?Stage III sacral ulcer ?Folate deficiency  ?Transaminitis ?  ?Recommendations: ?-Was started on folic acid for folate deficiency but pancytopenia has continued to worsen.  There was no evidence of hemolysis or TTP. ?-A bone marrow biopsy was performed earlier today and we will follow-up on these results. ?-We will check a DIC panel. ?-We will give 1 unit PRBCs today.  Monitor platelets and transfuse platelets for platelet count less than 20,000 or active bleeding. ?-Additional work-up per ID for HSV, EBV, CMV, parvo, HHV-6. ?-Further work-up per neurology for AMS. ? ?Mikey Bussing, DNP, AGPCNP-BC, AOCNP ? ?SUBJECTIVE:  ?Underwent a bone marrow biopsy earlier today.  Alert, but still moaning/groaning.  Answers a few questions.  Neurology and ID following. ? ?REVIEW OF SYSTEMS:   ?Review of Systems  ?Constitutional:  Negative for chills and fever.  ?HENT: Negative.    ?Respiratory: Negative.    ?Cardiovascular: Negative.   ?Skin: Negative.   ?Neurological: Negative.   ?Endo/Heme/Allergies:  Does not bruise/bleed easily.  ? ?I have reviewed the past medical history, past surgical history, social history and family history with the patient and they are unchanged from previous note. ? ? ?PHYSICAL EXAMINATION: ?ECOG PERFORMANCE STATUS: 3 - Symptomatic, >50% confined to bed ? ?Vitals:  ? 08/22/21 1140 08/22/21 1145  ?BP: 104/62 94/69  ?Pulse: (!) 103 (!) 105  ?Resp: (!) 22 20  ?Temp:    ?SpO2: 100% 95%  ? ?Filed Weights  ? 08/08/2021 0422 08/18/21 0500 08/20/21 1511  ?Weight: 63.5 kg 60.6 kg 62 kg  ? ? ?Intake/Output from previous day: ?03/16 0701 - 03/17 0700 ?In: 3169.4 [P.O.:15; I.V.:2730.4; IV Piggyback:424] ?Out: 1075 [Urine:1075] ? ?Physical Exam ?Vitals  reviewed.  ?Constitutional:   ?   General: She is not in acute distress. ?HENT:  ?   Head: Normocephalic.  ?   Mouth/Throat:  ?   Pharynx: No oropharyngeal exudate or posterior oropharyngeal erythema.  ?Eyes:  ?   General: No scleral icterus. ?Cardiovascular:  ?   Rate and Rhythm: Normal rate and regular rhythm.  ?Pulmonary:  ?   Effort: Pulmonary effort is normal. No respiratory distress.  ?Abdominal:  ?   General: There is no distension.  ?   Palpations: Abdomen is soft.  ?Skin: ?   General: Skin is warm and dry.  ?Neurological:  ?   Mental Status: She is alert.  ?   Comments: Oriented to person only.  ? ? ?LABORATORY DATA:  ?I have reviewed the data as listed ?CMP Latest Ref Rng & Units 08/22/2021 08/21/2021 08/20/2021  ?Glucose 70 - 99 mg/dL 209(H) 127(H) 60(L)  ?BUN 8 - 23 mg/dL _0 ?Creatinine 0.44 - 1.00 mg/dL 0.86 0.77 0.80  ?Sodium 135 - 145 mmol/L 126(L) 126(L) 135  ?Potassium 3.5 - 5.1 mmol/L 3.1(L) 3.6 3.8  ?Chloride 98 - 111 mmol/L 99 99 107  ?CO2 22 - 32 mmol/L 18(L) 17(L) 19(L)  ?Calcium 8.9 - 10.3 mg/dL 7.8(L) 8.0(L) 8.8(L)  ?Total Protein 6.5 - 8.1 g/dL 3.6(L) 3.7(L) -  ?Total Bilirubin 0.3 - 1.2 mg/dL 1.0 1.1 -  ?Alkaline Phos 38 - 126 U/L 197(H) 127(H) -  ?AST 15 - 41 U/L 142(H) 124(H) -  ?ALT 0 - 44 U/L 56(H) 48(H) -  ? ? ?Lab Results  ?Component Value  Date  ? WBC 3.7 (L) 08/22/2021  ? HGB 7.7 (L) 08/22/2021  ? HCT 22.6 (L) 08/22/2021  ? MCV 86.3 08/22/2021  ? PLT 38 (L) 08/22/2021  ? NEUTROABS 2.7 08/21/2021  ? ? ?No results found for: CEA1, CEA, CAN199, CA125, PSA1 ? ?DG Chest 1 View ? ?Result Date: 08/06/2021 ?CLINICAL DATA:  Dizziness, nausea and vomiting, bilateral leg pain EXAM: CHEST  1 VIEW COMPARISON:  04/16/2021 FINDINGS: Single frontal view of the chest demonstrates an unremarkable cardiac silhouette. No acute airspace disease, effusion, or pneumothorax. No acute bony abnormalities. IMPRESSION: 1. No acute intrathoracic process. Electronically Signed   By: Michael  Brown M.D.   On:  08/06/2021 19:41  ? ?CT HEAD WO CONTRAST (5MM) ? ?Result Date: 08/07/2021 ?CLINICAL DATA:  60-year-old female with history of delirium. Lower abdominal pain and constipation. EXAM: CT HEAD WITHOUT CONTRAST TECHNIQUE: Contiguous axial images were obtained from the base of the skull through the vertex without intravenous contrast. RADIATION DOSE REDUCTION: This exam was performed according to the departmental dose-optimization program which includes automated exposure control, adjustment of the mA and/or kV according to patient size and/or use of iterative reconstruction technique. COMPARISON:  Head CT 08/06/2021. FINDINGS: Brain: Mild cerebral atrophy. Patchy and confluent areas of decreased attenuation are noted throughout the deep and periventricular white matter of the cerebral hemispheres bilaterally, compatible with chronic microvascular ischemic disease. No evidence of acute infarction, hemorrhage, hydrocephalus, extra-axial collection or mass lesion/mass effect. Vascular: No hyperdense vessel or unexpected calcification. Skull: Normal. Negative for fracture or focal lesion. Sinuses/Orbits: No acute finding. Other: None. IMPRESSION: 1. No acute intracranial abnormalities. 2. Mild cerebral atrophy with chronic microvascular ischemic changes in the cerebral white matter, as above. Electronically Signed   By: Daniel  Entrikin M.D.   On: 09/04/2021 06:31  ? ?CT Head Wo Contrast ? ?Result Date: 08/06/2021 ?CLINICAL DATA:  Dizziness, nausea and vomiting EXAM: CT HEAD WITHOUT CONTRAST TECHNIQUE: Contiguous axial images were obtained from the base of the skull through the vertex without intravenous contrast. RADIATION DOSE REDUCTION: This exam was performed according to the departmental dose-optimization program which includes automated exposure control, adjustment of the mA and/or kV according to patient size and/or use of iterative reconstruction technique. COMPARISON:  03/26/2021, 04/17/2021 FINDINGS: Brain: No acute  infarct or hemorrhage. Lateral ventricles and midline structures are stable. No acute extra-axial fluid collections. No mass effect. Vascular: No hyperdense vessel or unexpected calcification. Skull: Normal. Negative for fracture or focal lesion. Sinuses/Orbits: No acute finding. Other: None. IMPRESSION: 1. No acute intracranial process. Electronically Signed   By: Michael  Brown M.D.   On: 08/06/2021 19:40  ? ?MR BRAIN WO CONTRAST ? ?Result Date: 08/18/2021 ?CLINICAL DATA:  Delirium.  Unable to follow commands EXAM: MRI HEAD WITHOUT CONTRAST TECHNIQUE: Multiplanar, multiecho pulse sequences of the brain and surrounding structures were obtained without intravenous contrast. COMPARISON:  Head CT from yesterday 04/17/2021 brain MRI FINDINGS: Brain: No acute infarction, hemorrhage, hydrocephalus, extra-axial collection or mass lesion. Cerebral volume loss without specific pattern. Vascular: Normal flow voids. Skull and upper cervical spine: Normal marrow signal. Sinuses/Orbits: Bilateral cataract resection. Minor mastoid opacification on the left more than right. Negative nasopharynx. Other: Motion degraded study requiring fast brain protocol. IMPRESSION: Motion degraded brain MRI without acute finding. No change since November 2022. Electronically Signed   By: Jonathan  Watts M.D.   On: 08/18/2021 11:46  ? ?CT ABDOMEN PELVIS W CONTRAST ? ?Result Date: 08/13/2021 ?CLINICAL DATA:  60-year-old female with history of acute onset of   nonlocalized abdominal pain. EXAM: CT ABDOMEN AND PELVIS WITH CONTRAST TECHNIQUE: Multidetector CT imaging of the abdomen and pelvis was performed using the standard protocol following bolus administration of intravenous contrast. RADIATION DOSE REDUCTION: This exam was performed according to the departmental dose-optimization program which includes automated exposure control, adjustment of the mA and/or kV according to patient size and/or use of iterative reconstruction technique. CONTRAST:   80mL OMNIPAQUE IOHEXOL 300 MG/ML  SOLN COMPARISON:  CT of the abdomen and pelvis 08/10/2021. FINDINGS: Comment: Portions of today's examination are limited by patient motion, and from artifact from the patient bein

## 2021-08-22 NOTE — Procedures (Signed)
Intubation Procedure Note ? ?Carmin Richmond Rahrig  ?AD:[id number]  ?Apr 12, 1961 ? ?Date:08/22/21  ?Time:8:55 PM  ? ?Provider Performing:Lafonda Patron A Kaylynne Andres  ? ? ?Procedure: Intubation ([id number]) ? ?Indication(s) ?Respiratory Failure ? ?Consent ?Procedure performed emergently ? ? ?Anesthesia ?Airway obtained without sedation emergently ? ? ?Time Out ?Verified patient identification, verified procedure, site/side was marked, verified correct patient position, special equipment/implants available, medications/allergies/relevant history reviewed, required imaging and test results available. ? ? ?Sterile Technique ?Usual hand hygeine, masks, and gloves were used ? ? ?Procedure Description ?Patient positioned in bed supine.  Sedation given as noted above.  Patient was intubated with endotracheal tube using Glidescope.  View was Grade 2 only posterior commissure .  Number of attempts was 1.  Colorimetric CO2 detector was consistent with tracheal placement. ? ? ?Complications/Tolerance ?None; patient tolerated the procedure well. ?Chest X-ray is ordered to verify placement. ? ? ?EBL ?none ? ? ?Specimen(s) ?None ? ?

## 2021-08-22 NOTE — Progress Notes (Signed)
Notified by blood bank that blood and cryo is ready. ?At this time, pt does not have IV access. ?CCMDs have attempted and no access available at this time. ? ? ?

## 2021-08-22 NOTE — Progress Notes (Signed)
PT Cancellation Note ? ?Patient Details ?Name: Pershing CoxLivia S  ?MRN: [id number] ?DOB: 11/26/1960 ? ? ?Cancelled Treatment:     PT deferred this date.  Pt with BP 94/69 and transfusion scheduled this date.  Will follow. ? ? ?Dakarai Mcglocklin ?08/22/2021, 4:09 PM ?

## 2021-08-22 NOTE — Progress Notes (Signed)
[facility] ? ?PROGRESS NOTE ? ?SAHANA BOYLAND WJX:[id number] DOB: November 11, 1960 DOA: 08/18/2021 ?PCP: Ann Held, DO ? ? ?Brief HPI:   ?61 y.o. female past medical history significant for depression, anxiety dermatomyositis, presyncope history of rheumatoid arthritis on Plaquenil, mycophenolate and daily prednisone comes into the ED for the first time in the past 12 days for altered mental status abdominal pain and constipation for several days with significant decreased oral intake. according to the niece prednisone was lowered to 17.5 mg a day and subsequently had to be increased to 40 mg a day she had similar symptoms in the past when she is tapered down on her steroids ? ? ? ?Subjective  ? ?Patient seen and examined, she is alert but still moaning and groaning.  Answers few questions.  Appreciate neuro and ID input.  She was started on IV acyclovir due to concern for HSV encephalopathy.  Platelet counts are down to 38,000. ? ? Assessment/Plan:  ? ? ? ?Acute metabolic encephalopathy ?-Patient presented with encephalopathy; unclear etiology ?-She was recently tapered off steroids as outpatient, as per family; concern for secondary adrenal insufficiency ?-Cortisol was checked today which was 26, patient given high-dose hydrocortisone 100 mg IV ?-Started on hydrocortisone 50 mg IV every 6 hours ?-MRI brain obtained 2 days ago showed no acute findings ?-Called and discussed with neurology, EEG was ordered; suggestive of moderate diffuse encephalopathy ?-Thiamine level obtained and patient started on high-dose thiamine; 500 mg IV 3 times daily ?-Neurology following ?-Discussed with neurology, patient can have atypical CNS infection, LP was ordered however could not be obtained as patient has low platelet count ?-ID consulted and patient started on IV acyclovir for possible HSV encephalopathy ?-Follow repeat blood cultures ?-Neurology following ? ?Positive blood cultures/Staphylococcus auricularis ?-1/4  blood culture positive for Staphylococcus auricularis ?-Likely contamination ?-Repeat blood cultures drawn and are negative to date ? ?Transaminitis ?-Unclear etiology ?-She had abdominal ultrasound on 08/10/2021, which showed gallbladder sludge and gallbladder wall thickening, no gallstones or sonographic Murphy sign  ?-CBD was mildly dilated measuring 7 mm ?-Check hepatitis panel ? ? ?Hypokalemia/hypomagnesemia ?-Potassium is 3.1, magnesium is 1.6 ?-Replace magnesium with 2 g magnesium sulfate IV x1 ?-We will replace potassium with IV KCl 10 mg x 3 ?-Follow labs in a.m. ? ? ?Hypoglycemia/?  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 ?Hyponatremia ?-Sodium 126 today, likely from hypotonic D10W given for hypoglycemia as above ?-Fluids were changed to D5 half-normal saline at 75 mill per hour ?-We will change fluids to D5 normal saline today as patient continues to have low sodium ? ? ?Pancytopenia ?-Unclear etiology; patient has been taking Plaquenil and mycophenolate at home ?-Folic acid is 3.8; replaced orally ?-Platelet count 38,000 ?-Hematology was consulted; plan was to obtain bone marrow biopsy ?-Bone marrow biopsy currently on hold due to patient's worsening mental status ? ?Acute kidney injury ?-Resolved ? ?Hypertension ?-ACE inhibitor on hold in setting of acute kidney injury ? ?Rheumatoid arthritis ?-Plaquenil, mycophenolate on hold ?-Continue steroids ? ?Incidental adrenal nodule ?-Patient will need nonemergent adrenal protocol CT scan ? ?Generalized weakness ?-As per patient's family  patient has dermatomyositis, rheumatoid arthritis ?-Rheumatologist was tapering of steroids and she gets the symptoms whenever steroids were tapered in the past ? ?Hyperchloremic metabolic acidosis ?-Has been stable for past few days ?-Follow BMP in am ? ?Elevated B12 level ?-B12 supplementation discontinued ? ?Medications ? ?  ? ARIPiprazole  5 mg Oral QHS  ? buPROPion  300 mg Oral Daily  ? Chlorhexidine Gluconate Cloth  6 each Topical Daily  ? escitalopram  20 mg Oral Daily  ? feeding supplement  237 mL Oral BID BM  ? folic acid  1 mg Oral Daily  ? hydrocortisone sod succinate (SOLU-CORTEF) inj  100 mg Intravenous Q12H  ? LORazepam  1 mg Intravenous Once  ? mouth rinse  15 mL Mouth Rinse BID  ? multivitamin with minerals  1 tablet Oral Daily  ? nutrition supplement (JUVEN)  1 packet Oral BID BM  ? pantoprazole  40 mg Oral Daily  ? senna-docusate  1 tablet Oral BID  ? ? ? Data Reviewed:  ? ?CBG: ? ?Recent Labs  ?Lab 08/21/21 ?0719 08/21/21 ?1155 08/21/21 ?1609 08/21/21 ?2146 08/22/21 ?0813  ?GLUCAP 162* 128* 155* 191* 167*  ? ? ?SpO2: 95 % ?O2 Flow Rate (L/min): 2 L/min  ? ? ?Vitals:  ? 08/22/21 0400 08/22/21 0500 08/22/21 0600 08/22/21 0907  ?BP: (!) 144/100     ?Pulse: (!) 104 (!) 109 (!) 109   ?Resp: (!) 22 (!) 22 (!) 32   ?Temp:   97.8 ?F (36.6 ?C) 98.3 ?F (36.8 ?C)  ?TempSrc:   Axillary Axillary  ?SpO2: 96% 95% 95%   ?Weight:      ?Height:      ? ? ? ? ?Data Reviewed: ? ?Basic Metabolic Panel: ?Recent Labs  ?Lab 09/05/2021 ?0502 09/05/2021 ?0800 08/18/21 ?0603 08/19/21 ?7017 08/20/21 ?7939 08/21/21 ?0256 08/22/21 ?0249  ?NA 134*  --  135 132* 135 126* 126*  ?K 3.7  --  3.3* 3.9 3.8 3.6 3.1*  ?CL 107  --  109 108 107 99 99  ?CO2 17*  --  18* 18* 19* 17* 18*  ?GLUCOSE 113*  --  91 75 60* 127* 209*  ?BUN 36*  --  24* $Re'17 17 15 15  'UPE$ ?CREATININE 1.37*  --  0.90 0.85 0.80 0.77 0.86  ?CALCIUM 9.3  --  8.4* 8.4* 8.8* 8.0* 7.8*  ?MG 1.9  --   --   --   --   --  1.6*  ?PHOS  --  2.8  --   --   --   --   --   ? ? ?CBC: ?Recent  Labs  ?Lab 08/19/21 ?0806 08/20/21 ?0300 08/21/21 ?0256 08/21/21 ?9233 08/22/21 ?0249  ?WBC 2.2* 3.0* 3.5* 3.9* 3.7*  ?NEUTROABS 1.8  --   --  2.7  --   ?HGB 8.8* 8.9* 8.4* 8.2* 7.7*  ?HCT 26.0* 26.5* 25.1* 23.9* 22.6*  ?MCV 88.7 88.9 88.4 86.6 86.3  ?PLT 58* 55* 42* 47* 38*  ? ? ?LFT ?Recent Labs  ?Lab 08/11/2021 ?0502 08/18/21 ?0603 08/21/21 ?0076 08/22/21 ?0249  ?AST 44* 43* 124* 142*  ?ALT 22 21 48* 56*  ?ALKPHOS 61 50 127* 197*  ?BILITOT 1.1 0.8 1.1 1.0  ?PROT 5.0* 3.9* 3.7* 3.6*  ?ALBUMIN 2.7* 2.1* 1.9* 2.0*  ? ?  ?Antibiotics: ?Anti-infectives (From admission, onward)  ? ? Start     Dose/Rate Route Frequency Ordered Stop  ? 08/21/21 1800  acyclovir (ZOVIRAX) 620 mg in dextrose 5 % 100 mL IVPB       ?  10 mg/kg ? 62 kg ?112.4 mL/hr over 60 Minutes Intravenous Every 8 hours 08/21/21 1550    ? 08/21/21 0945  cefTRIAXone (ROCEPHIN) 2 g in sodium chloride 0.9 % 100 mL IVPB  Status:  Discontinued       ? 2 g ?200 mL/hr over 30 Minutes Intravenous Every 24 hours 08/21/21 0849 08/21/21 0851  ? 08/21/21 0930  vancomycin (VANCOCIN) IVPB 1000 mg/200 mL premix  Status:  Discontinued       ? 1,000 mg ?200 mL/hr over 60 Minutes Intravenous  Once 08/21/21 0837 08/21/21 0842  ? 08/21/21 0930  cefTRIAXone (ROCEPHIN) 2 g in sodium chloride 0.9 % 100 mL IVPB  Status:  Discontinued       ? 2 g ?200 mL/hr over 30 Minutes Intravenous Every 12 hours 08/21/21 0837 08/21/21 0842  ? 08/21/21 0930  ampicillin (OMNIPEN) 2 g in sodium chloride 0.9 % 100 mL IVPB  Status:  Discontinued       ? 2 g ?300 mL/hr over 20 Minutes Intravenous Every 4 hours 08/21/21 0837 08/21/21 0842  ? 08/23/2021 0700  cefTRIAXone (ROCEPHIN) 2 g in sodium chloride 0.9 % 100 mL IVPB       ? 2 g ?200 mL/hr over 30 Minutes Intravenous  Once 08/26/2021 5271 09/05/2021 1633  ? ?  ? ? ? ?DVT prophylaxis: SCDs ? ?Code Status: Full code ? ?Family Communication: No family at bedside ? ? ?CONSULTS oncology ? ? ?Objective  ? ? ?Physical Examination: ? ?General-appears in no acute  distress ?Heart-S1-S2, regular, no murmur auscultated ?Lungs-clear to auscultation bilaterally, no wheezing or crackles auscultated ?Abdomen-soft, nontender, no organomegaly ?Extremities-no edema in the

## 2021-08-22 NOTE — Progress Notes (Signed)
Unable to start  ultrasound I.V. for Press[facility] at this time. ?As noted central line nor PICC placement able to be placed at this time. RN at bedside. ?

## 2021-08-22 NOTE — Consult Note (Signed)
? ?NAME:  Carrie [name], MRN:  [id number], DOB:  [birth year], LOS: 4 ?ADMISSION DATE:  08/16/2021, CONSULTATION DATE:  08/22/2021 ?REFERRING MD: Dr. Darrick Meigs, CHIEF COMPLAINT: Need for central access ? ?History of Present Illness:  ?Asked to see patient for central access placement ?Patient was admitted with altered mental status, metabolic encephalopathy ?History of hypertension, hyperlipidemia, anxiety/depression ?History of rheumatoid arthritis on hydroxychloroquine, mycophenolate and prednisone, history of dermatomyositis ? ?Was asked to see her because she has poor venous access ? ?Pertinent  Medical History  ? ?Past Medical History:  ?Diagnosis Date  ? Depression with anxiety 05/26/2021  ? Dermatomyositis ([place]) 06/23/2021  ? Fatty liver 06/23/2021  ? Hyperlipidemia   ? Hypertension   ? Non-traumatic rhabdomyolysis   ? Pre-syncope 03/26/2021  ? Rheumatoid arthritis flare ([place]) 04/08/2021  ? ?Significant Hospital Events: ?Including procedures, antibiotic start and stop dates in addition to other pertinent events   ?MRI 3/13-no acute findings ?3/17 bone marrow biopsy ?3/12 CT abdomen and pelvis-no acute findings, adrenal nodule ?3/12 CT head without contrast-no acute findings ?3/16 EEG-moderate diffuse encephalopathy of nonspecific etiology, no seizures or epileptiform discharges noted during the recording ?Interim History / Subjective:  ?Altered mental status, unable to follow commands ? ?Objective   ?Blood pressure (!) 155/131, pulse (!) 107, temperature 98.3 ?F (36.8 ?C), temperature source Axillary, resp. rate (!) 30, height $RemoveBe'5\' 1"'WQdJoVkJc$  (1.549 m), weight 62 kg, SpO2 100 %. ?   ?Vent Mode: PRVC ?FiO2 (%):  [100 %] 100 % ?Set Rate:  [16 bmp] 16 bmp ?Vt Set:  [380 mL] 380 mL ?PEEP:  [5 cmH20] 5 cmH20  ? ?Intake/Output Summary (Last 24 hours) at 08/22/2021 2031 ?Last data filed at 08/22/2021 2017 ?Gross per 24 hour  ?Intake 2364.14 ml  ?Output 625 ml  ?Net 1739.14 ml  ? ?Filed Weights  ? 08/11/2021 0422 08/18/21 0500 08/20/21 1511   ?Weight: 63.5 kg 60.6 kg 62 kg  ? ? ?Examination: ?General: Middle-aged, does not appear to be in distress ?HENT: Moist oral mucosa ?Lungs: Decreased air entry bilaterally ?Cardiovascular: S1-S2 appreciated ?Abdomen: Soft, bowel sounds appreciated ?Extremities: No clubbing, no edema ?Neuro: Unable to follow commands ?GU: Fair output ? ?Resolved Hospital Problem list   ? ? ?Assessment & Plan:  ?Acute metabolic encephalopathy ?Concern for meningitis/encephalitis ?-For lumbar puncture ?-MRI unremarkable ?-Patient remains on acyclovir pending lumbar puncture ?-Studies as per ID note ? ?Episode of a seizure today ?-Lasted about 3 minutes ?-Resolved with Versed ? ?History of rheumatoid arthritis ?-On mycophenolate, hydroxychloroquine, chronic prednisone ? ?History of dermatomyositis ? ?Pancytopenia ?Severe anemia ?-S/p bone marrow biopsy-3/17 ?-Follow DIC panel-did show elevated D-dimer, low fibrinogen supporting possible DIC ?-RBC and cryo ordered ?-1 unit packed red blood cells ordered ?-Will need platelet transfusion prior to LP ? ?Autoimmune encephalitis is a concern ?-Lumbar puncture pending ? ?Transaminitis with worsening ?-Recent abdominal ultrasound showed gallbladder sludge, gallbladder wall thickening ? ?Lactic acidosis ?Metabolic acidosis ? ?Hyponatremia ?-May be related to D10 water ?-Being monitored ? ?Sacral ulcer ? ?Rheumatoid arthritis ?-Mycophenolate and Plaquenil on hold ? ?Patient with multiple organ dysfunction, encephalopathy, DIC, respiratory failure, rheumatoid arthritis, electrolyte derangement, transaminitis ?Multiple ongoing comorbidities risk of decompensation remains very high ? ?Best Practice (right click and "Reselect all SmartList Selections" daily)  ? ?Diet/type: NPO ?DVT prophylaxis: other ?GI prophylaxis: PPI ?Lines: N/A ?Foley:  N/A ?Code Status:  full code ?Last date of multidisciplinary goals of care discussion [pending] ? ?Labs   ?CBC: ?Recent Labs  ?Lab 08/19/21 ?0806 08/20/21 ?8466  08/21/21 ?5993  08/21/21 ?3614 08/22/21 ?0249 08/22/21 ?1357  ?WBC 2.2* 3.0* 3.5* 3.9* 3.7*  --   ?NEUTROABS 1.8  --   --  2.7  --   --   ?HGB 8.8* 8.9* 8.4* 8.2* 7.7*  --   ?HCT 26.0* 26.5* 25.1* 23.9* 22.6*  --   ?MCV 88.7 88.9 88.4 86.6 86.3  --   ?PLT 58* 55* 42* 47* 38* 41*  ? ? ?Basic Metabolic Panel: ?Recent Labs  ?Lab 08/08/2021 ?0502 09/03/2021 ?0800 08/18/21 ?0603 08/19/21 ?4315 08/20/21 ?4008 08/21/21 ?0256 08/22/21 ?0249  ?NA 134*  --  135 132* 135 126* 126*  ?K 3.7  --  3.3* 3.9 3.8 3.6 3.1*  ?CL 107  --  109 108 107 99 99  ?CO2 17*  --  18* 18* 19* 17* 18*  ?GLUCOSE 113*  --  91 75 60* 127* 209*  ?BUN 36*  --  24* _0 ?CREATININE 1.37*  --  0.90 0.85 0.80 0.77 0.86  ?CALCIUM 9.3  --  8.4* 8.4* 8.8* 8.0* 7.8*  ?MG 1.9  --   --   --   --   --  1.6*  ?PHOS  --  2.8  --   --   --   --   --   ? ?GFR: ?Estimated Creatinine Clearance: 57.3 mL/min (by C-G formula based on SCr of 0.86 mg/dL). ?Recent Labs  ?Lab 08/09/2021 ?0502 08/09/2021 ?1149 08/18/21 ?0603 08/20/21 ?6761 08/21/21 ?0256 08/21/21 ?9509 08/22/21 ?0249  ?WBC 3.7*  --    < > 3.0* 3.5* 3.9* 3.7*  ?LATICACIDVEN 3.1* 2.0*  --   --   --   --   --   ? < > = values in this interval not displayed.  ? ? ?Liver Function Tests: ?Recent Labs  ?Lab 08/07/2021 ?0502 08/18/21 ?0603 08/21/21 ?3267 08/22/21 ?0249  ?AST 44* 43* 124* 142*  ?ALT 22 21 48* 56*  ?ALKPHOS 61 50 127* 197*  ?BILITOT 1.1 0.8 1.1 1.0  ?PROT 5.0* 3.9* 3.7* 3.6*  ?ALBUMIN 2.7* 2.1* 1.9* 2.0*  ? ?Recent Labs  ?Lab 09/03/2021 ?0502  ?LIPASE 40  ? ?Recent Labs  ?Lab 08/14/2021 ?1149  ?AMMONIA 23  ? ? ?ABG ?   ?Component Value Date/Time  ? HCO3 16.2 (L) 08/08/2021 1151  ? ACIDBASEDEF 7.6 (H) 08/09/2021 1151  ? O2SAT 57.1 08/27/2021 1151  ?  ? ?Coagulation Profile: ?Recent Labs  ?Lab 08/28/2021 ?0502 08/21/21 ?1351 08/22/21 ?1357  ?INR 1.1 1.3* 1.2  ? ? ?Cardiac Enzymes: ?Recent Labs  ?Lab 08/08/2021 ?0859 08/19/21 ?0806  ?CKTOTAL 49 45  ? ? ?HbA1C: ?Hgb A1c MFr Bld  ?Date/Time Value Ref Range Status   ?08/12/2021 05:02 AM 6.2 (H) 4.8 - 5.6 % Final  ?  Comment:  ?  (NOTE) ?        Prediabetes: 5.7 - 6.4 ?        Diabetes: >6.4 ?        Glycemic control for adults with diabetes: <7.0 ?  ?11/29/2014 10:47 AM 6.0 4.6 - 6.5 % Final  ?  Comment:  ?  Glycemic Control Guidelines for People with Diabetes:Non Diabetic:  <6%Goal of Therapy: <7%Additional Action Suggested:  >8%   ? ? ?CBG: ?Recent Labs  ?Lab 08/21/21 ?1609 08/21/21 ?2146 08/22/21 ?0813 08/22/21 ?1354 08/22/21 ?2023  ?GLUCAP 155* 191* 167* 110* 167*  ? ? ?Review of Systems:   ?Unable to provide history ? ?Past Medical History:  ?She,  has a past medical  history of Depression with anxiety (05/26/2021), Dermatomyositis ([place]) (06/23/2021), Fatty liver (06/23/2021), Hyperlipidemia, Hypertension, Non-traumatic rhabdomyolysis, Pre-syncope (03/26/2021), and Rheumatoid arthritis flare ([place]) (04/08/2021).  ? ?Surgical History:  ? ?Past Surgical History:  ?Procedure Laterality Date  ? ABDOMINAL HYSTERECTOMY    ? REDUCTION MAMMAPLASTY    ?  ? ?Social History:  ? reports that she quit smoking about 7 years ago. Her smoking use included cigarettes. She has never used smokeless tobacco. She reports that she does not currently use alcohol. She reports that she does not use drugs.  ? ?Family History:  ?Her family history includes Breast cancer in her mother and another family member; Breast cancer (age of onset: 26) in her sister; Coronary artery disease in an other family member; Diabetes in her sister; Hypertension in an other family member.  ? ?Allergies ?Allergies  ?Allergen Reactions  ? Zoloft [Sertraline Hcl] Diarrhea and Other (See Comments)  ?  Stomach upset ?  ?  ? ?Home Medications  ?Prior to Admission medications   ?Medication Sig Start Date End Date Taking? Authorizing Provider  ?acetaminophen (TYLENOL) 325 MG tablet Take 2 tablets (650 mg total) by mouth every 6 (six) hours as needed for mild pain (or Fever >/= 101). 04/29/21  Yes Angiulli, Lavon Paganini, PA-C   ?ARIPiprazole (ABILIFY) 5 MG tablet Take 5 mg by mouth at bedtime.   Yes [provider]  ?bisacodyl (DULCOLAX) 5 MG EC tablet Take 1 tablet (5 mg total) by mouth daily as needed for up to 10 doses for m

## 2021-08-22 NOTE — Procedures (Addendum)
Patient Name: Carrie [name]  ?MRN: [id number]  ?Epilepsy Attending: Charlsie QuestPriyanka O Sequan Auxier  ?Referring Physician/Provider: Meredeth IdeLama, Gagan S, MD ?Date: 08/21/2021 ?Duration: 22.40 mins ? ?Patient history: 61yo F with ams. EEG to evaluate for seizure ? ?Level of alertness: Awake ? ?AEDs during EEG study: None ? ?Technical aspects: This EEG study was done with scalp electrodes positioned according to the 10-20 International system of electrode placement. Electrical activity was acquired at a sampling rate of 500Hz  and reviewed with a high frequency filter of 70Hz  and a low frequency filter of 1Hz . EEG data were recorded continuously and digitally stored.  ? ?Description: EEG showed continuous generalized 3 to 7 Hz theta-delta slowing. Hyperventilation and photic stimulation were not performed.    ? ?Of note, study ws technically difficult due to significant movement artifact. ? ?ABNORMALITY ?- Continuous slow, generalized ? ?IMPRESSION: ?This technically difficult study is suggestive of moderate diffuse encephalopathy, nonspecific etiology. No seizures or epileptiform discharges were seen throughout the recording. ? ?Charlsie QuestPriyanka O Kaleiyah Polsky  ? ?

## 2021-08-22 NOTE — Progress Notes (Signed)
Initial Nutrition Assessment ? ?DOCUMENTATION CODES:  ? ?Not applicable ? ?INTERVENTION:  ?- diet re-advancement as medically feasible. ?- continue Ensure Plus High Protein BID and Juven BID.  ? ? ?NUTRITION DIAGNOSIS:  ? ?Increased nutrient needs related to acute illness, wound healing as evidenced by estimated needs. -ongoing ? ?GOAL:  ? ?Patient will meet greater than or equal to 90% of their needs -unmet ? ?MONITOR:  ? ?PO intake, Supplement acceptance, Labs, Weight trends, Skin ? ?ASSESSMENT:  ? ?61 y.o. female past medical history of HTN, HLD, depression, anxiety, dermatomyositis, rheumatoid arthritis on Plaquenil, mycophenolate, and daily prednisone, and fatty liver. She presented to the ED due to AMS, abdominal pain, and several days of constipation with significantly decreased PO intake. Her niece reported that prednisone was decreased and then subsequently needed to be increased recently. Her niece reported that patient has had similar symptoms in the past when steroids have been tapered. She was admitted for lactic acidosis. ? ?She is currently out of the room to CT. ? ?Patient seen by this RD on 3/13. She was changed from Heart Healthy/Carb Modified, thin liquids diet to NPO at midnight. The only documented meal intakes during this week were 35% of lunch on 3/13, 0% of breakfast on 3/14, and 0% of breakfast on 3/16. ? ?Per review of orders, she has accepted Ensure and Juven ~90% of the time offered.  ? ?Yesterday patient was noted to be too lethargic to safely consume anything PO. She is s/p EEG which showed moderate diffuse encephalopathy. Neurology is following.  ? ?Bone marrow biopsy has been postponed d/t worsening mental status. AKI has resolved. Supplemental vitamin B12 has been discontinued d/t high serum level.  ? ?SLP recommendation yesterday was for Dysphagia 1.  ? ? ?Labs reviewed; CBG: 167 mg/dl, Na: 126 mmol/l, K: 3.1 mmol/l, Ca: 7.8 mg/dl, Mg: 1.6 mg/dl, Alk Phos elevated, LFTs  elevated. ? ?Medications reviewed; 1 mg folvite/day, 2 g IV Mg sulfate x1 run 3/17, 1 tablet multivitamin with minerals/day, 40 mg oral protonix/day, 10 mEq IV KCl x3 runs 3/17, 1 tablet senokot BID, 500 mg IV thiamine TID.  ? ?IVF; D5-NS @ 125 ml/hr (510 kcal/24 hrs).  ? ? ?Diet Order:   ?Diet Order   ? ?       ?  Diet NPO time specified Except for: Sips with Meds  Diet effective midnight       ?  ? ?  ?  ? ?  ? ? ?EDUCATION NEEDS:  ? ?Education needs have been addressed ? ?Skin:  Skin Assessment: Skin Integrity Issues: ?Skin Integrity Issues:: Stage III ?Stage III: R hand and sacrum ? ?Last BM:  3/13 (type 1, small amount) ? ?Height:  ? ?Ht Readings from Last 1 Encounters:  ?08/20/21 $RemoveBe'5\' 1"'DetMAQvxi$  (1.549 m)  ? ? ?Weight:  ? ?Wt Readings from Last 1 Encounters:  ?08/20/21 62 kg  ? ? ? ?BMI:  Body mass index is 25.83 kg/m?. ? ?Estimated Nutritional Needs:  ?Kcal:  1900-2100 kcal ?Protein:  95-110 grams ?Fluid:  >/= 2 L/day ? ? ? ? ?Jarome Matin, MS, RD, LDN ?Registered Dietitian II ?Inpatient Clinical Nutrition ?RD pager # and on-call/weekend pager # available in [place]  ? ?

## 2021-08-22 NOTE — Progress Notes (Signed)
Attempted MRI of the C-spine and brain. Pt is currently not an MRI candidate. Pt constantly moaning and lifting her head. Patient will need to be able to lay flat and still for to complete this. Pt was unable to hold still for 20 secs currently MRI orders can be replaced if pt's mentation changes and is able to undergo an MRI. Patient was premedicated prior to MRI attempt. Pt is currently a safety risk to attempt at her current state pt constantly raising her head and hitting it on top of the anterior attachment that is needed to obtain these images.  ?

## 2021-08-22 NOTE — Progress Notes (Addendum)
Asked to see patient for central venous access ? ?Patient very encephalopathic and constantly moving ?Did give 1 mg of Versed, repeated another milligram of Versed ? ?Peripheral sites was reviewed and could not find a good peripheral vein to access ? ?Highly collapsible vessels ? ?Attempt made at right IJ-could not thread the line ?Attempts made at right femoral-arterial stick ? ?Did ask for another provider to attempt access ? ?Attempted left IJ-very collapsible vessels, line could not be placed ? ? ?As we were cleaning up noted the patient was moaning, groaning and started to posterior ? ?Had a seizure lasted about 3 minutes ?Received Versed ? ?IO access was placed ? ?Received Neo-Synephrine via the IO ?Will be started on levophed ? ? ? ?Did update E-link to keep an eye on the patient ?Will request that the oncoming physician also take a look at the patient for vascular access and arterial line ? ?Total critical care time 70 minutes ? ? ?

## 2021-08-22 NOTE — Progress Notes (Signed)
PICC ordered at 1854 for urgent insertion. Bedside RN made aware that PICC can not be placed tonight.  ?

## 2021-08-22 NOTE — Procedures (Signed)
Central Venous Catheter Insertion Procedure Note ? ?Carrie [name]   ?[id number]  ?12/24/1960 ? ?Date:08/22/21  ?Time:8:26 PM  ? ?Provider Performing:Giada Schoppe A Adan Baehr  ? ?Procedure: Insertion of Non-tunneled Central Venous Catheter(36556) with US guidance ([id number])  ?Attempt was unsuccessful ? ?Indication([name]) ?Medication administration and Difficult access ? ?Consent ?Risks of the procedure as well as the alternatives and risks of each were explained to the patient and/or caregiver.  Consent for the procedure was obtained and is signed in the bedside chart ? ?Anesthesia ?Topical only with 1% lidocaine  ? ?Timeout ?Verified patient identification, verified procedure, site/side was marked, verified correct patient position, special equipment/implants available, medications/allergies/relevant history reviewed, required imaging and test results available. ? ?Sterile Technique ?Maximal sterile technique including full sterile barrier drape, hand hygiene, sterile gown, sterile gloves, mask, hair covering, sterile ultrasound probe cover (if used). ? ?Procedure Description ?Area of catheter insertion was cleaned with chlorhexidine and draped in sterile fashion.  With real-time ultrasound guidance an attempt at central access was made at the right IJ, vein was very collapsible and could not be easily cannulated ? ?Complications/Tolerance ?None; patient tolerated the procedure well. ?Chest X-ray is ordered to verify placement for internal jugular or subclavian cannulation.   Chest x-ray is not ordered for femoral cannulation. ? ?EBL ?Minimal ? ?Specimen([name]) ?None ? ? ?

## 2021-08-22 NOTE — Consult Note (Signed)
? ?Chief Complaint: ?Patient was seen in consultation today for  ?Chief Complaint  ?Patient presents with  ? Abdominal Pain  ? ? ?Referring Physician(s): Dr. Leonel Ramsay ? ?Supervising Physician: Sandi Mariscal ? ?Patient Status: [facility] - In-pt ? ?History of Present Illness: ?Carrie [name] is a 61 y.o. female with a medical history significant for anxiety/depression, dermatomyositis and rheumatoid arthritis on plaquenil, mycophenolate and prednisone. She presented to the [facility] ED 08/11/2021 with complaints of abdominal pain, altered mental status, constipation and decreased oral intake with dehydration. This was her third visit to the ED in the past 12 days with the same complaint.  ? ?Lab work up was significant for pancytopenia and the patient was seen today in IR for a bone marrow biopsy. Neurology was consulted for the patient's continued altered mental status and due to concern for acute metabolic encephalopathy versus meningitis/encephalitis a lumbar puncture was requested. IR recommended this procedure be done under moderate sedation due to the patient's agitation/restlessness and neurology was agreeable to this.   ? ?Past Medical History:  ?Diagnosis Date  ? Depression with anxiety 05/26/2021  ? Dermatomyositis ([place]) 06/23/2021  ? Fatty liver 06/23/2021  ? Hyperlipidemia   ? Hypertension   ? Non-traumatic rhabdomyolysis   ? Pre-syncope 03/26/2021  ? Rheumatoid arthritis flare ([place]) 04/08/2021  ? ? ?Past Surgical History:  ?Procedure Laterality Date  ? ABDOMINAL HYSTERECTOMY    ? REDUCTION MAMMAPLASTY    ? ? ?Allergies: ?Zoloft [sertraline hcl] ? ?Medications: ?Prior to Admission medications   ?Medication Sig Start Date End Date Taking? Authorizing Provider  ?acetaminophen (TYLENOL) 325 MG tablet Take 2 tablets (650 mg total) by mouth every 6 (six) hours as needed for mild pain (or Fever >/= 101). 04/29/21  Yes Angiulli, Lavon Paganini, PA-C  ?ARIPiprazole (ABILIFY) 5 MG tablet Take 5 mg by mouth at bedtime.   Yes [provider]  ?bisacodyl (DULCOLAX) 5 MG EC tablet Take 1 tablet (5 mg total) by mouth daily as needed for up to 10 doses for moderate constipation. 08/10/21  Yes Wyvonnia Dusky, MD  ?buPROPion (WELLBUTRIN XL) 300 MG 24 hr tablet Take 1 tablet (300 mg total) by mouth daily. ?Patient taking differently: Take 300 mg by mouth every morning. 05/26/21  Yes Ann Held, DO  ?Calcium Carb-Cholecalciferol (CALCIUM+D3 PO) Take 1 tablet by mouth every morning. Calcium 1200 mg, D3 200 units   Yes [provider]  ?docusate sodium (COLACE) 100 MG capsule Take 100 mg by mouth every evening.   Yes [provider]  ?escitalopram (LEXAPRO) 20 MG tablet Take 1 tablet (20 mg total) by mouth daily. ?Patient taking differently: Take 20 mg by mouth every morning. 05/26/21  Yes Roma Schanz R, DO  ?fluticasone (FLONASE) 50 MCG/ACT nasal spray Place 2 sprays into both nostrils daily. ?Patient taking differently: Place 2 sprays into both nostrils daily as needed for allergies. 12/24/20  Yes Roma Schanz R, DO  ?hydroxychloroquine (PLAQUENIL) 200 MG tablet Take 400 mg by mouth every morning.   Yes [provider]  ?lisinopril (ZESTRIL) 20 MG tablet Take 1 tablet (20 mg total) by mouth daily. ?Patient taking differently: Take 20 mg by mouth every morning. 05/26/21  Yes Ann Held, DO  ?loratadine (CLARITIN) 10 MG tablet Take 1 tablet (10 mg total) by mouth daily. ?Patient taking differently: Take 10 mg by mouth daily as needed for allergies or rhinitis. 12/24/20  Yes Roma Schanz R, DO  ?mycophenolate (CELLCEPT) 500 MG  tablet Take 1,000 mg by mouth 2 (two) times daily.   Yes [provider]  ?pantoprazole (PROTONIX) 40 MG tablet Take 1 tablet (40 mg total) by mouth daily. ?Patient taking differently: Take 40 mg by mouth every morning. 05/26/21  Yes Roma Schanz R, DO  ?predniSONE (DELTASONE) 10 MG tablet Take 40 mg by mouth every morning. 06/20/21  Yes  [provider]  ?traMADol (ULTRAM) 50 MG tablet Take 1 tablet (50 mg total) by mouth every 12 (twelve) hours as needed for up to 10 doses for severe pain. 08/10/21  Yes Trifan, Carola Rhine, MD  ?Vitamin D3 (VITAMIN D) 25 MCG tablet Take 1 tablet (1,000 Units total) by mouth daily. ?Patient taking differently: Take 1,000 Units by mouth every morning. 05/26/21  Yes Ann Held, DO  ?bisacodyl (DULCOLAX) 10 MG suppository Place 1 suppository (10 mg total) rectally daily as needed for up to 10 doses for moderate constipation. ?Patient not taking: Reported on 08/26/2021 08/10/21   Wyvonnia Dusky, MD  ?ondansetron (ZOFRAN) 4 MG tablet Take 4 mg by mouth daily. ?Patient not taking: Reported on 08/06/2021 08/13/21   [provider]  ?senna-docusate (SENOKOT-S) 8.6-50 MG tablet Take 1 tablet by mouth 2 (two) times daily. ?Patient not taking: Reported on 08/31/2021 04/29/21   Cathlyn Parsons, PA-C  ?sulfamethoxazole-trimethoprim (BACTRIM DS) 800-160 MG tablet Take 1 tablet by mouth every Monday, Wednesday, and Friday. ?Patient not taking: Reported on 09/01/2021 07/17/21   [provider]  ?  ? ?Family History  ?Problem Relation Age of Onset  ? Breast cancer Mother   ?     deceased by 20  ? Breast cancer Sister 24  ? Diabetes Sister   ? Breast cancer Other   ? Coronary artery disease Other   ? Hypertension Other   ? ? ?Social History  ? ?Socioeconomic History  ? Marital status: Single  ?  Spouse name: Not on file  ? Number of children: Not on file  ? Years of education: Not on file  ? Highest education level: Not on file  ?Occupational History  ? Not on file  ?Tobacco Use  ? Smoking status: Former  ?  Types: Cigarettes  ?  Quit date: 02/05/2014  ?  Years since quitting: 7.5  ? Smokeless tobacco: Never  ? Tobacco comments:  ?  1 cig every 4-5 days ---weaning off  ?Vaping Use  ? Vaping Use: Unknown  ?Substance and Sexual Activity  ? Alcohol use: Not Currently  ? Drug use: No  ? Sexual activity: Not  Currently  ?  Partners: Male  ?Other Topics Concern  ? Not on file  ?Social History Narrative  ? Exercise --rare  ? ?Social Determinants of Health  ? ?Financial Resource Strain: Not on file  ?Food Insecurity: Not on file  ?Transportation Needs: Not on file  ?Physical Activity: Not on file  ?Stress: Not on file  ?Social Connections: Not on file  ? ? ?Review of Systems: A 12 point ROS discussed and pertinent positives are indicated in the HPI above.  All other systems are negative. ? ?Review of Systems  ?Unable to perform ROS: Mental status change  ? ?Vital Signs: ?BP (!) 144/109   Pulse (!) 102   Temp 98.3 ?F (36.8 ?C) (Axillary)   Resp (!) 27   Ht $R'5\' 1"'Jj$  (1.549 m)   Wt 136 lb 11 oz (62 kg)   SpO2 96%   BMI 25.83 kg/m?  ? ?Physical Exam ?  Constitutional:   ?   General: She is in acute distress.  ?   Appearance: She is ill-appearing.  ?   Comments: Patient is restless in bed with eyes closed. She is naked, uncovered and with safety mitts in place. She moaned constantly during my assessment. She was nonverbal and responded negatively to touch (withdrew, moaned louder).   ?HENT:  ?   Mouth/Throat:  ?   Mouth: Mucous membranes are moist.  ?   Pharynx: Oropharynx is clear.  ?Cardiovascular:  ?   Rate and Rhythm: Regular rhythm. Tachycardia present.  ?Pulmonary:  ?   Effort: Pulmonary effort is normal.  ?   Breath sounds: Normal breath sounds.  ?Abdominal:  ?   General: Bowel sounds are normal.  ?   Palpations: Abdomen is soft.  ?Musculoskeletal:  ?   Right lower leg: No edema.  ?   Left lower leg: No edema.  ?Skin: ?   General: Skin is warm and dry.  ?Neurological:  ?   Mental Status: She is disoriented.  ? ?Imaging: ?DG Chest 1 View ? ?Result Date: 08/06/2021 ?CLINICAL DATA:  Dizziness, nausea and vomiting, bilateral leg pain EXAM: CHEST  1 VIEW COMPARISON:  04/16/2021 FINDINGS: Single frontal view of the chest demonstrates an unremarkable cardiac silhouette. No acute airspace disease, effusion, or pneumothorax. No  acute bony abnormalities. IMPRESSION: 1. No acute intrathoracic process. Electronically Signed   By: Randa Ngo M.D.   On: 08/06/2021 19:41  ? ?CT HEAD WO CONTRAST (5MM) ? ?Result Date: 08/07/2021 ?CLINICAL DATA:

## 2021-08-22 NOTE — Progress Notes (Signed)
?  Carrie [name] for Infectious Disease   ? ?Date of Admission:  08/09/2021   Total days of antibiotics 2 acyclovir ? ? ?ID: Carrie [name] is a 61 y.o. female with  hx of RA, dermamyositis admitted for encephalopathy and new onset pancytopenia ?Principal Problem: ?  Lactic acidosis ?Active Problems: ?  Hyperlipidemia ?  Hypertension ?  Generalized weakness ?  Rheumatoid arthritis flare (HCC) ?  Depression with anxiety ?  Pancytopenia ([place]) ?  Volume depletion ?  Long-term corticosteroid use ?  Open wound of right hand ?  Adrenal nodule ([place]) ?  Pressure injury of skin ?  High anion gap metabolic acidosis ? ? ? ?Subjective: ?Afebrile but remains encephalopathic, underwent BM aspirate.  ? ?Medications:  ? sodium chloride   Intravenous Once  ? sodium chloride   Intravenous Once  ? ARIPiprazole  5 mg Oral QHS  ? buPROPion  300 mg Oral Daily  ? Chlorhexidine Gluconate Cloth  6 each Topical Daily  ? escitalopram  20 mg Oral Daily  ? feeding supplement  237 mL Oral BID BM  ? fentaNYL      ? folic acid  1 mg Oral Daily  ? hydrocortisone sod succinate (SOLU-CORTEF) inj  100 mg Intravenous Q12H  ? mouth rinse  15 mL Mouth Rinse BID  ? midazolam      ? multivitamin with minerals  1 tablet Oral Daily  ? nutrition supplement (JUVEN)  1 packet Oral BID BM  ? pantoprazole  40 mg Oral Daily  ? senna-docusate  1 tablet Oral BID  ? ? ?Objective: ?Vital signs in last 24 hours: ?Temp:  [97.8 ?F (36.6 ?C)-99.4 ?F (37.4 ?C)] 98.3 ?F (36.8 ?C) (03/17 0017) ?Pulse Rate:  [100-123] 102 (03/17 1300) ?Resp:  [20-33] 27 (03/17 1600) ?BP: (94-147)/(62-109) 144/109 (03/17 1600) ?SpO2:  [92 %-100 %] 96 % (03/17 1300) ? ?Physical Exam  ?Constitutional:  sleeping, intermittently saying no. appears well-developed and well-nourished. No distress.  ?HENT: [place]/AT, PERRLA, no scleral icterus ?Mouth/Throat: Oropharynx is clear and moist. No oropharyngeal exudate.  ?Cardiovascular: Normal rate, regular rhythm and normal heart sounds. Exam reveals no  gallop and no friction rub.  ?No murmur heard.  ?Pulmonary/Chest: Effort normal and breath sounds normal. No respiratory distress.  has no wheezes.  ?Neck = supple, no nuchal rigidity ?Abdominal: Soft. Bowel sounds are normal.  exhibits no distension. There is no tenderness.  ?Lymphadenopathy: no cervical adenopathy. No axillary adenopathy  ?Skin: Skin is warm and dry. No rash noted. No erythema.  ? ? ? ?Lab Results ?Recent Labs  ?  08/21/21 ?0256 08/21/21 ?4944 08/22/21 ?0249  ?WBC 3.5* 3.9* 3.7*  ?HGB 8.4* 8.2* 7.7*  ?HCT 25.1* 23.9* 22.6*  ?NA 126*  --  126*  ?K 3.6  --  3.1*  ?CL 99  --  99  ?CO2 17*  --  18*  ?BUN 15  --  15  ?CREATININE 0.77  --  0.86  ? ?Liver Panel ?Recent Labs  ?  08/21/21 ?0256 08/22/21 ?0249  ?PROT 3.7* 3.6*  ?ALBUMIN 1.9* 2.0*  ?AST 124* 142*  ?ALT 48* 56*  ?ALKPHOS 127* 197*  ?BILITOT 1.1 1.0  ? ?Sedimentation Rate ?No results for input(s): ESRSEDRATE in the last 72 hours. ?C-Reactive Protein ?No results for input(s): CRP in the last 72 hours. ? ?Microbiology: ?reviewed ?Studies/Results: ?CT BIOPSY ? ?Result Date: 08/22/2021 ?INDICATION: Pancytopenia of uncertain etiology. Please perform CT-guided bone marrow biopsy for tissue diagnostic purposes. EXAM: CT-GUIDED BONE MARROW BIOPSY  AND ASPIRATION MEDICATIONS: None ANESTHESIA/SEDATION: Moderate (conscious) sedation was employed during this procedure as administered by the Interventional Radiology RN. A total of Versed 2 mg and Fentanyl 100 mcg was administered intravenously. Moderate Sedation Time: 10 minutes. The patient's level of consciousness and vital signs were monitored continuously by radiology nursing throughout the procedure under my direct supervision. COMPLICATIONS: None immediate. PROCEDURE: Informed consent was obtained from the patient's family following an explanation of the procedure, risks, benefits and alternatives. The patient's family understands, agrees and consents for the procedure. All questions were addressed. A  time out was performed prior to the initiation of the procedure. The patient was positioned prone and non-contrast localization CT was performed of the pelvis to demonstrate the iliac marrow spaces. The operative site was prepped and draped in the usual sterile fashion. Under sterile conditions and local anesthesia, a 22 gauge spinal needle was utilized for procedural planning. Next, an 11 gauge coaxial bone biopsy needle was advanced into the left iliac marrow space. Needle position was confirmed with CT imaging. Initially, a bone marrow aspiration was performed. Next, a bone marrow biopsy was obtained with the 11 gauge outer bone marrow device. Samples were prepared with the cytotechnologist and deemed adequate. The needle was removed and superficial hemostasis was obtained with manual compression. A dressing was applied. The patient tolerated the procedure well without immediate post procedural complication. IMPRESSION: Successful CT guided left iliac bone marrow aspiration and core biopsy. Electronically Signed   By: Carrie [name] M.D.   On: 08/22/2021 12:45  ? ?EEG adult ? ?Result Date: 08/22/2021 ?Carrie Havens, MD     08/22/2021  8:18 AM Patient Name: Carrie [name] MRN: [id number] Epilepsy Attending: Lora [name] Referring Physician/Provider: Oswald Hillock, MD Date: 08/21/2021 Duration: 22.40 mins Patient history: 61yo F with ams. EEG to evaluate for seizure Level of alertness: Awake AEDs during EEG study: None Technical aspects: This EEG study was done with scalp electrodes positioned according to the 10-20 International system of electrode placement. Electrical activity was acquired at a sampling rate of $Remov'500Hz'PabCfT$  and reviewed with a high frequency filter of $RemoveB'70Hz'WKjFIGbX$  and a low frequency filter of $RemoveB'1Hz'PmfHyuvc$ . EEG data were recorded continuously and digitally stored. Description: EEG showed continuous generalized 3 to 7 Hz theta-delta slowing. Hyperventilation and photic stimulation were not performed.   Of note, study  ws technically difficult due to significant movement artifact. ABNORMALITY - Continuous slow, generalized IMPRESSION: This technically difficult study is suggestive of moderate diffuse encephalopathy, nonspecific etiology. No seizures or epileptiform discharges were seen throughout the recording. Priyanka Barbra Sarks  ? ?CT BONE MARROW BIOPSY & ASPIRATION ? ?Result Date: 08/22/2021 ?INDICATION: Pancytopenia of uncertain etiology. Please perform CT-guided bone marrow biopsy for tissue diagnostic purposes. EXAM: CT-GUIDED BONE MARROW BIOPSY AND ASPIRATION MEDICATIONS: None ANESTHESIA/SEDATION: Moderate (conscious) sedation was employed during this procedure as administered by the Interventional Radiology RN. A total of Versed 2 mg and Fentanyl 100 mcg was administered intravenously. Moderate Sedation Time: 10 minutes. The patient's level of consciousness and vital signs were monitored continuously by radiology nursing throughout the procedure under my direct supervision. COMPLICATIONS: None immediate. PROCEDURE: Informed consent was obtained from the patient's family following an explanation of the procedure, risks, benefits and alternatives. The patient's family understands, agrees and consents for the procedure. All questions were addressed. A time out was performed prior to the initiation of the procedure. The patient was positioned prone and non-contrast localization CT was performed of the pelvis to demonstrate the iliac  marrow spaces. The operative site was prepped and draped in the usual sterile fashion. Under sterile conditions and local anesthesia, a 22 gauge spinal needle was utilized for procedural planning. Next, an 11 gauge coaxial bone biopsy needle was advanced into the left iliac marrow space. Needle position was confirmed with CT imaging. Initially, a bone marrow aspiration was performed. Next, a bone marrow biopsy was obtained with the 11 gauge outer bone marrow device. Samples were prepared with the  cytotechnologist and deemed adequate. The needle was removed and superficial hemostasis was obtained with manual compression. A dressing was applied. The patient tolerated the procedure well without immediate

## 2021-08-22 NOTE — Progress Notes (Addendum)
Updated patient's sister about not being able to place a line ? ?She has an IO access ? ?Made aware of the seizure ?Made her aware that she is on a ventilator at present ? ?She will still be getting a repeat lumbar puncture in a.m. ? ?Continue current lines of care ? ?We will keep sister-Ms. Gaynell Face updated ?

## 2021-08-22 NOTE — Procedures (Signed)
Attempted to place left IJ CVC but due to poor vascular with collapsible vein the carotid artery was punctured, Direct pressure was held for 69mins due to coagulopathy.  ? ?While holding pressure to site patient began having seizure activity with snoring respirations and flexion of upper extremities. Lasting about 3 mins  ? ?1mg  of IM versed was given and patient was emergently intubated.  ? ?Post intubation she became hypotensives resulting in establishment of left IO acces.  ? ?Shefali Ng D. Harris, NP-C ?[facility] ?Personal contact information can be found on Amion  ?08/22/2021, 7:05 PM ? ? ?

## 2021-08-22 NOTE — Procedures (Signed)
Central Venous Catheter Insertion Procedure Note ? ?Carrie [name]  ?AD:[id number]  ?1961/02/26 ? ?Date:08/22/21  ?Time:8:28 PM  ? ?Provider Performing:Cecilie Heidel A Brynne Doane  ? ?Procedure: Insertion of Non-tunneled Central Venous Catheter(36556) with US guidance [id number])  ?Attempt was unsuccessful ? ?Indication(s) ?Medication administration and Difficult access ? ?Consent ?Risks of the procedure as well as the alternatives and risks of each were explained to the patient and/or caregiver.  Consent for the procedure was obtained and is signed in the bedside chart ?On ? ?Anesthesia ?Topical only with 1% lidocaine  ? ?Timeout ?Verified patient identification, verified procedure, site/side was marked, verified correct patient position, special equipment/implants available, medications/allergies/relevant history reviewed, required imaging and test results available. ? ?Sterile Technique ?Maximal sterile technique including full sterile barrier drape, hand hygiene, sterile gown, sterile gloves, mask, hair covering, sterile ultrasound probe cover (if used). ? ?Procedure Description ?Area of catheter insertion was cleaned with chlorhexidine and draped in sterile fashion.  With real-time ultrasound guidance an attempt was made to cannulate the femoral vein ?Attempt was unsuccessful, arterial puncture ? ?Complications/Tolerance ?Bruising noted at site ? ? ?EBL ?Bruising noted at site ? ?Specimen(s) ?None ? ? ?

## 2021-08-22 NOTE — Progress Notes (Signed)
Neurology Progress Note ? ?Brief HPI: 61 y.o. female with PMHx of HTN, HLD, anxiety/depression,  RA on hydroxychloroquine, mycophenolate and daily prednisone  dermatomyositis who presented to the ED 3/12 for AMS, abdominal pain, and dehydration as her 3rd presentation over 2 weeks. Patient with similar symptoms reported in the past with prednisone tapering and recent taper in prednisone from 40 mg to 17.5 mg/day.  ? ?Subjective: ?Worsening pancytopenia on AM labs s/p bone marrow biopsy. Platelet count down to 38,000.  ?Physical exam consistent with yesterday's assessment  ? ?Exam: ?Vitals:  ? 08/22/21 1140 08/22/21 1145  ?BP: 104/62 94/69  ?Pulse: (!) 103 (!) 105  ?Resp: (!) 22 20  ?Temp:    ?SpO2: 100% 95%  ? ?Gen: Laying comfortably in bed with intermittent moaning and yelling out.  ?Resp: non-labored breathing, no respiratory distress on room air ?Abd: soft, non-tender, non-distended ? ?Neuro: ?Mental Status: Awake, moaning vocalizations. She is able to state her name with minimally intelligible speech, otherwise, does not attempt to answer orientation questions.  ?Does not answer "yes/no" questions correctly.  ?She does not name objects or repeat phrases.  ?She is intermittently restless and agitated throughout assessment and intermittently follows commands with constant coaching. ?Cranial Nerves: PERRL, EOMI, no obvious facial asymmetry, hearing is intact to voice, tongue and palate are midline. ?Motor: Bilateral upper extremities 4/5 strength, bilateral lower extremities 3/5 strength, rigid.  ?Sensory: Withdraws with application of noxious stimuli throughout ?Gait: Deferred ? ?Pertinent Labs: ?CBC ?   ?Component Value Date/Time  ? WBC 3.7 (L) 08/22/2021 0249  ? RBC 2.62 (L) 08/22/2021 0249  ? HGB 7.7 (L) 08/22/2021 0249  ? HCT 22.6 (L) 08/22/2021 0249  ? PLT 38 (L) 08/22/2021 0249  ? MCV 86.3 08/22/2021 0249  ? MCH 29.4 08/22/2021 0249  ? MCHC 34.1 08/22/2021 0249  ? RDW 17.2 (H) 08/22/2021 0249  ? LYMPHSABS  1.1 08/21/2021 0835  ? MONOABS 0.1 08/21/2021 0835  ? EOSABS 0.0 08/21/2021 0835  ? BASOSABS 0.0 08/21/2021 0835  ? ?CMP  ?   ?Component Value Date/Time  ? NA 126 (L) 08/22/2021 0249  ? K 3.1 (L) 08/22/2021 0249  ? CL 99 08/22/2021 0249  ? CO2 18 (L) 08/22/2021 0249  ? GLUCOSE 209 (H) 08/22/2021 0249  ? BUN 15 08/22/2021 0249  ? CREATININE 0.86 08/22/2021 0249  ? CREATININE 1.22 (H) 09/29/2019 1513  ? CALCIUM 7.8 (L) 08/22/2021 0249  ? PROT 3.6 (L) 08/22/2021 0249  ? ALBUMIN 2.0 (L) 08/22/2021 0249  ? AST 142 (H) 08/22/2021 0249  ? ALT 56 (H) 08/22/2021 0249  ? ALKPHOS 197 (H) 08/22/2021 0249  ? BILITOT 1.0 08/22/2021 0249  ? GFRNONAA >60 08/22/2021 0249  ? GFRAA  08/24/2008 1731  ?  >60        ?The eGFR has been calculated ?using the MDRD equation. ?This calculation has not been ?validated in all clinical ?situations. ?eGFR's persistently ?<60 mL/min signify ?possible Chronic Kidney Disease.  ? ? Latest Reference Range & Units 08/21/21 13:51  ?Crypto Ag NEGATIVE  NEGATIVE  ?Cryptococcal Ag Titer NOT INDICATED  NOT INDICATED  ? ?Results for orders placed or performed during the hospital encounter of 08/19/2021  ?Culture, blood (single)     Status: Abnormal  ? Collection Time: 08/20/2021  5:15 AM  ? Specimen: BLOOD  ?Result Value Ref Range Status  ? Specimen Description   Final  ?  BLOOD LEFT ANTECUBITAL ?Performed at [facility], [place] [address] [place], [place] [address] ?  ?  Special Requests   Final  ?  BOTTLES DRAWN AEROBIC AND ANAEROBIC Blood Culture adequate volume ?Performed at [facility], [place] [address], [place], [place] [address] ?  ? Culture  Setup Time   Final  ?  GRAM POSITIVE COCCI IN CLUSTERS ?AEROBIC BOTTLE ONLY ?CRITICAL RESULT CALLED TO, READ BACK BY AND VERIFIED WITH: Maren Beach 2449 [phone] FCP ?  ? Culture (A)  Final  ?  STAPHYLOCOCCUS AURICULARIS ?THE SIGNIFICANCE OF ISOLATING THIS ORGANISM FROM A SINGLE SET OF BLOOD CULTURES WHEN MULTIPLE SETS ARE  DRAWN IS UNCERTAIN. PLEASE NOTIFY THE MICROBIOLOGY DEPARTMENT WITHIN ONE WEEK IF SPECIATION AND SENSITIVITIES ARE REQUIRED. ?Performed at [facility], [place] [address] [place], [place] [address] ?  ? Report Status 08/20/2021 FINAL  Final  ?Blood Culture ID Panel (Reflexed)     Status: Abnormal  ? Collection Time: 09/02/2021  5:15 AM  ?Result Value Ref Range Status  ? Enterococcus faecalis NOT DETECTED NOT DETECTED Final  ? Enterococcus Faecium NOT DETECTED NOT DETECTED Final  ? Listeria monocytogenes NOT DETECTED NOT DETECTED Final  ? Staphylococcus species DETECTED (A) NOT DETECTED Final  ?  Comment: CRITICAL RESULT CALLED TO, READ BACK BY AND VERIFIED WITH: ?Maren Beach 1323 [phone] FCP ?  ? Staphylococcus aureus (BCID) NOT DETECTED NOT DETECTED Final  ? Staphylococcus epidermidis NOT DETECTED NOT DETECTED Final  ? Staphylococcus lugdunensis NOT DETECTED NOT DETECTED Final  ? Streptococcus species NOT DETECTED NOT DETECTED Final  ? Streptococcus agalactiae NOT DETECTED NOT DETECTED Final  ? Streptococcus pneumoniae NOT DETECTED NOT DETECTED Final  ? Streptococcus pyogenes NOT DETECTED NOT DETECTED Final  ? A.calcoaceticus-baumannii NOT DETECTED NOT DETECTED Final  ? Bacteroides fragilis NOT DETECTED NOT DETECTED Final  ? Enterobacterales NOT DETECTED NOT DETECTED Final  ? Enterobacter cloacae complex NOT DETECTED NOT DETECTED Final  ? Escherichia coli NOT DETECTED NOT DETECTED Final  ? Klebsiella aerogenes NOT DETECTED NOT DETECTED Final  ? Klebsiella oxytoca NOT DETECTED NOT DETECTED Final  ? Klebsiella pneumoniae NOT DETECTED NOT DETECTED Final  ? Proteus species NOT DETECTED NOT DETECTED Final  ? Salmonella species NOT DETECTED NOT DETECTED Final  ? Serratia marcescens NOT DETECTED NOT DETECTED Final  ? Haemophilus influenzae NOT DETECTED NOT DETECTED Final  ? Neisseria meningitidis NOT DETECTED NOT DETECTED Final  ? Pseudomonas aeruginosa NOT DETECTED NOT DETECTED Final  ? Stenotrophomonas  maltophilia NOT DETECTED NOT DETECTED Final  ? Candida albicans NOT DETECTED NOT DETECTED Final  ? Candida auris NOT DETECTED NOT DETECTED Final  ? Candida glabrata NOT DETECTED NOT DETECTED Final  ? Candida krusei NOT DETECTED NOT DETECTED Final  ? Candida parapsilosis NOT DETECTED NOT DETECTED Final  ? Candida tropicalis NOT DETECTED NOT DETECTED Final  ? Cryptococcus neoformans/gattii NOT DETECTED NOT DETECTED Final  ?  Comment: Performed at [facility], [address], [place], [place] [address]  ?Resp Panel by RT-PCR (Flu A&B, Covid) Nasopharyngeal Swab     Status: None  ? Collection Time: 08/18/2021  6:50 AM  ? Specimen: Nasopharyngeal Swab; Nasopharyngeal(NP) swabs in vial transport medium  ?Result Value Ref Range Status  ? SARS Coronavirus 2 by RT PCR NEGATIVE NEGATIVE Final  ?  Comment: (NOTE) ?SARS-CoV-2 target nucleic acids are NOT DETECTED. ? ?The SARS-CoV-2 RNA is generally detectable in upper respiratory ?specimens during the acute phase of infection. The lowest ?concentration of SARS-CoV-2 viral copies this assay can detect is ?138 copies/mL. A negative result does not preclude SARS-Cov-2 ?  infection and should not be used as the sole basis for treatment or ?other patient management decisions. A negative result may occur with  ?improper specimen collection/handling, submission of specimen other ?than nasopharyngeal swab, presence of viral mutation(s) within the ?areas targeted by this assay, and inadequate number of viral ?copies(<138 copies/mL). A negative result must be combined with ?clinical observations, patient history, and epidemiological ?information. The expected result is Negative. ? ?Fact Sheet for Patients:  ?EntrepreneurPulse.com.au ? ?Fact Sheet for Healthcare Providers:  ?IncredibleEmployment.be ? ?This test is no t yet approved or cleared by the [place] FDA and  ?has been authorized for detection and/or diagnosis of SARS-CoV-2 by ?FDA under  an Emergency Use Authorization (EUA). This EUA will remain  ?in effect (meaning this test can be used) for the duration of the ?COVID-19 declaration under Section 564(b)(1) of the Act, 21 ?U.S.C.section 360bbb-3(b

## 2021-08-22 NOTE — Progress Notes (Signed)
Pt. Has significant bilateral bruising and also infiltration on right arm. There are no appropriate veins for PIV on left side at this time. Notified RN and discussed need for central line or PICC due to limited vasculature and many IV medications.  RN notifying physician. ?

## 2021-08-23 ENCOUNTER — Inpatient Hospital Stay (HOSPITAL_COMMUNITY): Payer: Federal, State, Local not specified - PPO

## 2021-08-23 DIAGNOSIS — R4182 Altered mental status, unspecified: Secondary | ICD-10-CM | POA: Diagnosis not present

## 2021-08-23 DIAGNOSIS — E872 Acidosis, unspecified: Secondary | ICD-10-CM | POA: Diagnosis not present

## 2021-08-23 LAB — BASIC METABOLIC PANEL
Anion gap: 6 (ref 5–15)
BUN: 22 mg/dL (ref 8–23)
CO2: 18 mmol/L — ABNORMAL LOW (ref 22–32)
Calcium: 6.8 mg/dL — ABNORMAL LOW (ref 8.9–10.3)
Chloride: 101 mmol/L (ref 98–111)
Creatinine, Ser: 1.13 mg/dL — ABNORMAL HIGH (ref 0.44–1.00)
GFR, Estimated: 55 mL/min — ABNORMAL LOW (ref >60)
Glucose, Bld: 109 mg/dL — ABNORMAL HIGH (ref 70–99)
Potassium: 3.6 mmol/L (ref 3.5–5.1)
Sodium: 125 mmol/L — ABNORMAL LOW (ref 135–145)

## 2021-08-23 LAB — COMPREHENSIVE METABOLIC PANEL
ALT: 58 U/L — ABNORMAL HIGH (ref 0–44)
AST: 119 U/L — ABNORMAL HIGH (ref 15–41)
Albumin: 1.8 g/dL — ABNORMAL LOW (ref 3.5–5.0)
Alkaline Phosphatase: 226 U/L — ABNORMAL HIGH (ref 38–126)
Anion gap: 9 (ref 5–15)
BUN: 16 mg/dL (ref 8–23)
CO2: 18 mmol/L — ABNORMAL LOW (ref 22–32)
Calcium: 7.3 mg/dL — ABNORMAL LOW (ref 8.9–10.3)
Chloride: 99 mmol/L (ref 98–111)
Creatinine, Ser: 1.03 mg/dL — ABNORMAL HIGH (ref 0.44–1.00)
GFR, Estimated: >60 mL/min (ref >60)
Glucose, Bld: 218 mg/dL — ABNORMAL HIGH (ref 70–99)
Potassium: 3.8 mmol/L (ref 3.5–5.1)
Sodium: 126 mmol/L — ABNORMAL LOW (ref 135–145)
Total Bilirubin: 1.5 mg/dL — ABNORMAL HIGH (ref 0.3–1.2)
Total Protein: 3.6 g/dL — ABNORMAL LOW (ref 6.5–8.1)

## 2021-08-23 LAB — CBC
HCT: 24.9 % — ABNORMAL LOW (ref 36.0–46.0)
HCT: 25.6 % — ABNORMAL LOW (ref 36.0–46.0)
Hemoglobin: 8.7 g/dL — ABNORMAL LOW (ref 12.0–15.0)
Hemoglobin: 9 g/dL — ABNORMAL LOW (ref 12.0–15.0)
MCH: 31.4 pg (ref 26.0–34.0)
MCH: 30.7 pg (ref 26.0–34.0)
MCHC: 34.9 g/dL (ref 30.0–36.0)
MCHC: 35.2 g/dL (ref 30.0–36.0)
MCV: 89.9 fL (ref 80.0–100.0)
MCV: 87.4 fL (ref 80.0–100.0)
Platelets: 42 10*3/uL — ABNORMAL LOW (ref 150–400)
Platelets: 98 10*3/uL — ABNORMAL LOW (ref 150–400)
RBC: 2.77 MIL/uL — ABNORMAL LOW (ref 3.87–5.11)
RBC: 2.93 MIL/uL — ABNORMAL LOW (ref 3.87–5.11)
RDW: 16 % — ABNORMAL HIGH (ref 11.5–15.5)
RDW: 16.1 % — ABNORMAL HIGH (ref 11.5–15.5)
WBC: 9.1 10*3/uL (ref 4.0–10.5)
WBC: 8.1 10*3/uL (ref 4.0–10.5)
nRBC: 1.9 % — ABNORMAL HIGH (ref 0.0–0.2)
nRBC: 3.6 % — ABNORMAL HIGH (ref 0.0–0.2)

## 2021-08-23 LAB — GLUCOSE, CAPILLARY
Glucose-Capillary: 171 mg/dL — ABNORMAL HIGH (ref 70–99)
Glucose-Capillary: 213 mg/dL — ABNORMAL HIGH (ref 70–99)
Glucose-Capillary: 113 mg/dL — ABNORMAL HIGH (ref 70–99)
Glucose-Capillary: 106 mg/dL — ABNORMAL HIGH (ref 70–99)
Glucose-Capillary: 93 mg/dL (ref 70–99)

## 2021-08-23 LAB — BPAM CRYOPRECIPITATE
Blood Product Expiration Date: 202303172209
ISSUE DATE / TIME: 202303172041
Unit Type and Rh: 6200

## 2021-08-23 LAB — CMV IGM: CMV IgM: <30 AU/mL (ref 0.0–29.9)

## 2021-08-23 LAB — RHEUMATOID FACTOR: Rheumatoid fact SerPl-aCnc: 10.6 IU/mL (ref <14.0)

## 2021-08-23 LAB — PREPARE CRYOPRECIPITATE: Unit division: 0

## 2021-08-23 LAB — PREPARE RBC (CROSSMATCH)

## 2021-08-23 LAB — CSF CELL COUNT WITH DIFFERENTIAL
RBC Count, CSF: 1 /mm3 — ABNORMAL HIGH
Tube #: 4
WBC, CSF: 0 /mm3 (ref 0–5)

## 2021-08-23 LAB — CMV ANTIBODY, IGG (EIA): CMV Ab - IgG: >10 U/mL — ABNORMAL HIGH (ref 0.00–0.59)

## 2021-08-23 LAB — PHOSPHORUS: Phosphorus: 2.1 mg/dL — ABNORMAL LOW (ref 2.5–4.6)

## 2021-08-23 LAB — PROTEIN AND GLUCOSE, CSF
Glucose, CSF: 86 mg/dL — ABNORMAL HIGH (ref 40–70)
Total  Protein, CSF: 69 mg/dL — ABNORMAL HIGH (ref 15–45)

## 2021-08-23 LAB — MAGNESIUM: Magnesium: 1.2 mg/dL — ABNORMAL LOW (ref 1.7–2.4)

## 2021-08-23 IMAGING — DX DG CHEST 1V PORT
1 series · 1 of 1 positions shown · non-contrast
Comparison: [DATE]

CLINICAL DATA: Intubation.  Feeding tube placement.

EXAM:
PORTABLE CHEST 1 VIEW

[chest]
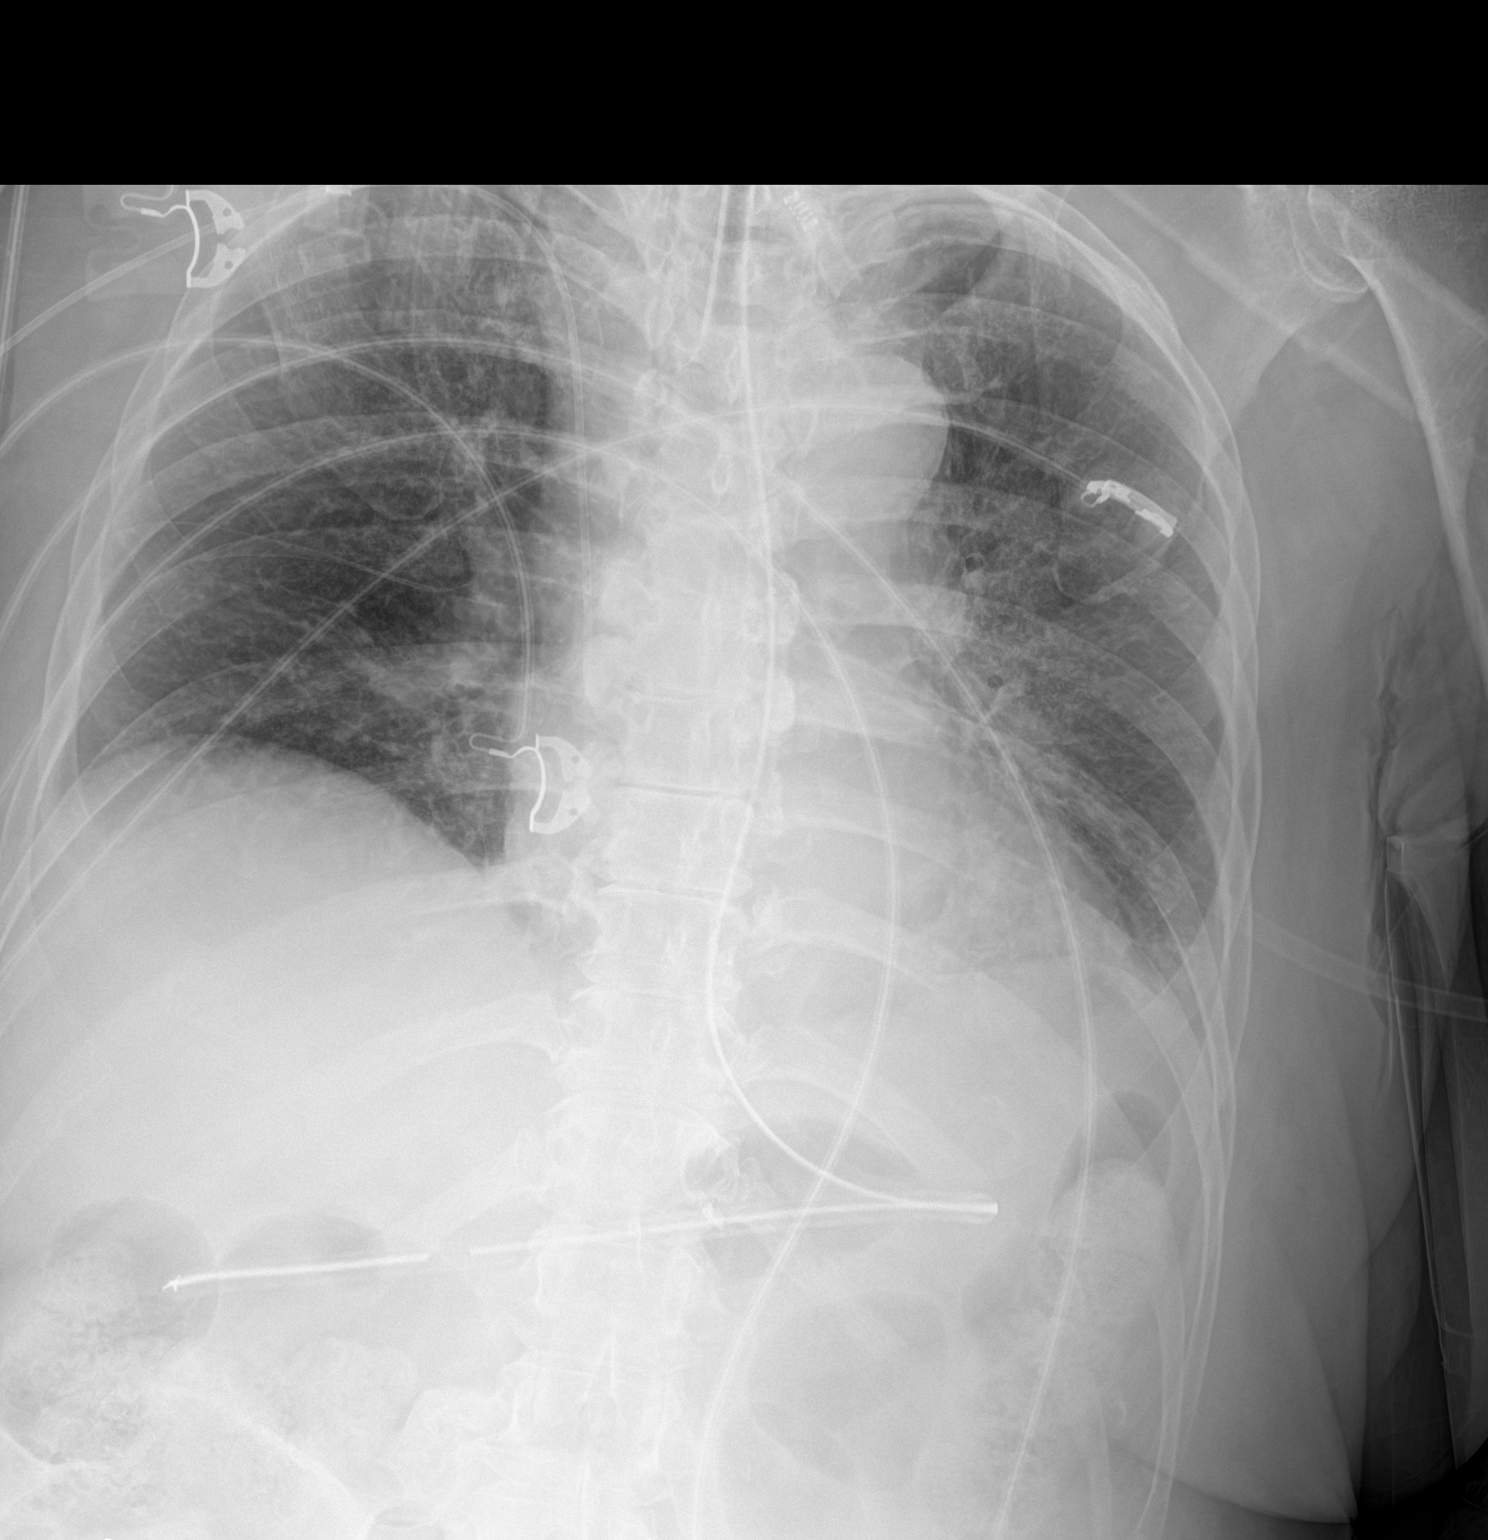

[1 of 1 positions shown; findings below may reference images not displayed]

FINDINGS: The ETT is in good position. A new right PICC line terminates in the
central SVC. An NG tube terminates in the region of the distal
stomach or proximal duodenum. No pneumothorax. No nodules or masses.
No focal infiltrates. No overt edema. Stable cardiomediastinal
silhouette.
IMPRESSION: 1. The ETT is in good position. A new right PICC line is in good
position. The NG tube terminates in the region of the distal stomach
or proximal duodenum. The patient may benefit from withdrawing the
NG tube approximately 6 cm.
2.

## 2021-08-23 MED ORDER — ACETAMINOPHEN 160 MG/5ML PO SOLN
650.0000 mg | Freq: Four times a day (QID) | ORAL | Status: DC | PRN
  Administered 2021-08-23 – 2021-09-15 (×7): 650 mg
  Filled 2021-08-23 (×7): qty 20.3

## 2021-08-23 MED ORDER — PROSOURCE TF PO LIQD
45.0000 mL | Freq: Two times a day (BID) | ORAL | Status: DC
  Administered 2021-08-23: 45 mL
  Filled 2021-08-23: qty 45

## 2021-08-23 MED ORDER — SODIUM CHLORIDE 0.9% IV SOLUTION: Freq: Once | INTRAVENOUS | Status: AC

## 2021-08-23 MED ORDER — SODIUM CHLORIDE 0.9% FLUSH
10.0000 mL | Freq: Two times a day (BID) | INTRAVENOUS | Status: DC
  Administered 2021-08-23: 40 mL
  Administered 2021-08-23 – 2021-08-25 (×5): 10 mL
  Administered 2021-08-26: 30 mL
  Administered 2021-08-26 – 2021-08-27 (×2): 10 mL
  Administered 2021-08-27 – 2021-08-28 (×2): 30 mL
  Administered 2021-08-28: 20 mL
  Administered 2021-08-29 – 2021-09-02 (×7): 10 mL
  Administered 2021-09-03: 30 mL
  Administered 2021-09-04 (×2): 10 mL
  Administered 2021-09-05: 20 mL
  Administered 2021-09-05 – 2021-09-09 (×8): 10 mL
  Administered 2021-09-10: 20 mL
  Administered 2021-09-10 – 2021-09-11 (×2): 10 mL
  Administered 2021-09-11 – 2021-09-12 (×2): 20 mL
  Administered 2021-09-12 – 2021-09-14 (×4): 10 mL
  Administered 2021-09-14: 20 mL
  Administered 2021-09-15: 10 mL
  Administered 2021-09-15 – 2021-09-16 (×2): 20 mL
  Administered 2021-09-16 – 2021-09-18 (×4): 10 mL
  Administered 2021-09-19: 30 mL
  Administered 2021-09-20: 20 mL

## 2021-08-23 MED ORDER — SODIUM CHLORIDE 0.9 % IV SOLN: INTRAVENOUS | Status: DC

## 2021-08-23 MED ORDER — VITAL HIGH PROTEIN PO LIQD: 1000.0000 mL | ORAL | Status: DC

## 2021-08-23 MED ORDER — ARIPIPRAZOLE 5 MG PO TABS
5.0000 mg | ORAL_TABLET | Freq: Every day | ORAL | Status: DC
  Administered 2021-08-23 – 2021-09-20 (×29): 5 mg
  Filled 2021-08-23 (×30): qty 1

## 2021-08-23 MED ORDER — JUVEN PO PACK
1.0000 | PACK | Freq: Two times a day (BID) | ORAL | Status: DC
  Administered 2021-08-24 – 2021-09-21 (×55): 1
  Filled 2021-08-23 (×53): qty 1

## 2021-08-23 MED ORDER — NOREPINEPHRINE 4 MG/250ML-% IV SOLN
0.0000 ug/min | INTRAVENOUS | Status: DC
  Administered 2021-08-23: 2 ug/min via INTRAVENOUS
  Administered 2021-08-24: 6 ug/min via INTRAVENOUS
  Administered 2021-08-24: 12 ug/min via INTRAVENOUS
  Administered 2021-08-24: 7 ug/min via INTRAVENOUS
  Administered 2021-08-26: 2 ug/min via INTRAVENOUS
  Filled 2021-08-23 (×5): qty 250

## 2021-08-23 MED ORDER — ACETAMINOPHEN 325 MG PO TABS
650.0000 mg | ORAL_TABLET | Freq: Four times a day (QID) | ORAL | Status: DC | PRN
  Administered 2021-08-29 – 2021-09-12 (×7): 650 mg
  Filled 2021-08-23 (×9): qty 2

## 2021-08-23 MED ORDER — DEXTROSE 5 % IV SOLN
10.0000 mg/kg | Freq: Two times a day (BID) | INTRAVENOUS | Status: DC
  Administered 2021-08-23 – 2021-08-24 (×2): 620 mg via INTRAVENOUS
  Filled 2021-08-23 (×6): qty 12.4

## 2021-08-23 MED ORDER — SODIUM CHLORIDE 0.9 % IV BOLUS
500.0000 mL | Freq: Once | INTRAVENOUS | Status: AC
  Administered 2021-08-23: 500 mL via INTRAVENOUS

## 2021-08-23 MED ORDER — SODIUM CHLORIDE 0.9 % IV SOLN
1000.0000 mg | Freq: Every day | INTRAVENOUS | Status: AC
  Administered 2021-08-23 – 2021-08-27 (×5): 1000 mg via INTRAVENOUS
  Filled 2021-08-23 (×6): qty 16

## 2021-08-23 MED ORDER — ACETAMINOPHEN 650 MG RE SUPP: 650.0000 mg | Freq: Four times a day (QID) | RECTAL | Status: DC | PRN

## 2021-08-23 MED ORDER — THIAMINE HCL 100 MG PO TABS
100.0000 mg | ORAL_TABLET | Freq: Every day | ORAL | Status: DC
  Administered 2021-08-24 – 2021-09-21 (×28): 100 mg
  Filled 2021-08-23 (×28): qty 1

## 2021-08-23 MED ORDER — SODIUM CHLORIDE 0.9% FLUSH: 10.0000 mL | INTRAVENOUS | Status: DC | PRN

## 2021-08-23 MED ORDER — VITAL AF 1.2 CAL PO LIQD
1000.0000 mL | ORAL | Status: DC
  Administered 2021-08-23 – 2021-08-26 (×2): 1000 mL

## 2021-08-23 MED ORDER — PANTOPRAZOLE SODIUM 40 MG IV SOLR
40.0000 mg | Freq: Every day | INTRAVENOUS | Status: DC
  Administered 2021-08-23: 40 mg via INTRAVENOUS
  Filled 2021-08-23: qty 10

## 2021-08-23 MED ORDER — SENNOSIDES-DOCUSATE SODIUM 8.6-50 MG PO TABS
1.0000 | ORAL_TABLET | Freq: Two times a day (BID) | ORAL | Status: DC
  Administered 2021-08-23 – 2021-08-24 (×3): 1
  Filled 2021-08-23 (×2): qty 1

## 2021-08-23 MED ORDER — FOLIC ACID 5 MG/ML IJ SOLN
1.0000 mg | Freq: Every day | INTRAMUSCULAR | Status: DC
  Administered 2021-08-23: 1 mg via INTRAVENOUS
  Filled 2021-08-23 (×2): qty 0.2

## 2021-08-23 MED ORDER — CHLORHEXIDINE GLUCONATE CLOTH 2 % EX PADS
6.0000 | MEDICATED_PAD | Freq: Every day | CUTANEOUS | Status: DC
  Administered 2021-08-23 – 2021-09-21 (×30): 6 via TOPICAL

## 2021-08-23 MED ORDER — ADULT MULTIVITAMIN W/MINERALS CH
1.0000 | ORAL_TABLET | Freq: Every day | ORAL | Status: DC
  Administered 2021-08-24: 1
  Filled 2021-08-23: qty 1

## 2021-08-23 MED ORDER — INSULIN ASPART 100 UNIT/ML IJ SOLN
0.0000 [IU] | INTRAMUSCULAR | Status: DC
  Administered 2021-08-23: 5 [IU] via SUBCUTANEOUS
  Administered 2021-08-24 (×2): 3 [IU] via SUBCUTANEOUS
  Administered 2021-08-24: 2 [IU] via SUBCUTANEOUS
  Administered 2021-08-24: 8 [IU] via SUBCUTANEOUS
  Administered 2021-08-24: 5 [IU] via SUBCUTANEOUS
  Administered 2021-08-25 – 2021-08-26 (×3): 2 [IU] via SUBCUTANEOUS
  Administered 2021-08-26: 3 [IU] via SUBCUTANEOUS
  Administered 2021-08-26 (×2): 2 [IU] via SUBCUTANEOUS
  Administered 2021-08-26 – 2021-08-28 (×7): 3 [IU] via SUBCUTANEOUS
  Administered 2021-08-28: 2 [IU] via SUBCUTANEOUS
  Administered 2021-08-28: 3 [IU] via SUBCUTANEOUS
  Administered 2021-08-28: 5 [IU] via SUBCUTANEOUS
  Administered 2021-08-28: 3 [IU] via SUBCUTANEOUS
  Administered 2021-08-28: 5 [IU] via SUBCUTANEOUS
  Administered 2021-08-29 (×2): 2 [IU] via SUBCUTANEOUS
  Administered 2021-08-29: 5 [IU] via SUBCUTANEOUS
  Administered 2021-08-29 (×2): 3 [IU] via SUBCUTANEOUS
  Administered 2021-08-30 (×2): 8 [IU] via SUBCUTANEOUS
  Administered 2021-08-30: 5 [IU] via SUBCUTANEOUS
  Administered 2021-08-30: 3 [IU] via SUBCUTANEOUS
  Administered 2021-08-30: 8 [IU] via SUBCUTANEOUS
  Administered 2021-08-31 (×5): 3 [IU] via SUBCUTANEOUS
  Administered 2021-08-31: 5 [IU] via SUBCUTANEOUS
  Administered 2021-09-01 (×2): 2 [IU] via SUBCUTANEOUS
  Administered 2021-09-01 – 2021-09-02 (×2): 3 [IU] via SUBCUTANEOUS
  Administered 2021-09-02: 2 [IU] via SUBCUTANEOUS
  Administered 2021-09-02 – 2021-09-04 (×10): 3 [IU] via SUBCUTANEOUS
  Administered 2021-09-04: 8 [IU] via SUBCUTANEOUS
  Administered 2021-09-04: 2 [IU] via SUBCUTANEOUS
  Administered 2021-09-04: 5 [IU] via SUBCUTANEOUS
  Administered 2021-09-04: 2 [IU] via SUBCUTANEOUS
  Administered 2021-09-04: 3 [IU] via SUBCUTANEOUS
  Administered 2021-09-04: 8 [IU] via SUBCUTANEOUS
  Administered 2021-09-05: 3 [IU] via SUBCUTANEOUS
  Administered 2021-09-05: 2 [IU] via SUBCUTANEOUS
  Administered 2021-09-05: 5 [IU] via SUBCUTANEOUS
  Administered 2021-09-05: 2 [IU] via SUBCUTANEOUS
  Administered 2021-09-06 (×2): 3 [IU] via SUBCUTANEOUS
  Administered 2021-09-06 (×2): 5 [IU] via SUBCUTANEOUS
  Administered 2021-09-06 (×2): 3 [IU] via SUBCUTANEOUS
  Administered 2021-09-06: 5 [IU] via SUBCUTANEOUS
  Administered 2021-09-07: 8 [IU] via SUBCUTANEOUS
  Administered 2021-09-07: 3 [IU] via SUBCUTANEOUS
  Administered 2021-09-07: 5 [IU] via SUBCUTANEOUS
  Administered 2021-09-07: 8 [IU] via SUBCUTANEOUS
  Administered 2021-09-07: 11 [IU] via SUBCUTANEOUS
  Administered 2021-09-08: 2 [IU] via SUBCUTANEOUS
  Administered 2021-09-08: 3 [IU] via SUBCUTANEOUS
  Administered 2021-09-08: 1 [IU] via SUBCUTANEOUS
  Administered 2021-09-08: 5 [IU] via SUBCUTANEOUS
  Administered 2021-09-08: 3 [IU] via SUBCUTANEOUS
  Administered 2021-09-08: 5 [IU] via SUBCUTANEOUS
  Administered 2021-09-09 (×2): 3 [IU] via SUBCUTANEOUS
  Administered 2021-09-09: 5 [IU] via SUBCUTANEOUS
  Administered 2021-09-09 (×3): 3 [IU] via SUBCUTANEOUS
  Administered 2021-09-10: 5 [IU] via SUBCUTANEOUS
  Administered 2021-09-10: 8 [IU] via SUBCUTANEOUS
  Administered 2021-09-10 (×2): 5 [IU] via SUBCUTANEOUS
  Administered 2021-09-10: 3 [IU] via SUBCUTANEOUS
  Administered 2021-09-10: 5 [IU] via SUBCUTANEOUS
  Administered 2021-09-10: 3 [IU] via SUBCUTANEOUS
  Administered 2021-09-11: 5 [IU] via SUBCUTANEOUS
  Administered 2021-09-11 (×2): 3 [IU] via SUBCUTANEOUS
  Administered 2021-09-11 (×2): 5 [IU] via SUBCUTANEOUS
  Administered 2021-09-12: 2 [IU] via SUBCUTANEOUS
  Administered 2021-09-12: 3 [IU] via SUBCUTANEOUS
  Administered 2021-09-12: 5 [IU] via SUBCUTANEOUS

## 2021-08-23 MED ORDER — THIAMINE HCL 100 MG/ML IJ SOLN: 100.0000 mg | Freq: Every day | INTRAMUSCULAR | Status: DC

## 2021-08-23 NOTE — Progress Notes (Signed)
Peripherally Inserted Central Catheter Placement ? ?The IV Nurse has discussed with the patient and/or persons authorized to consent for the patient, the purpose of this procedure and the potential benefits and risks involved with this procedure.  The benefits include less needle sticks, lab draws from the catheter, and the patient may be discharged home with the catheter. Risks include, but not limited to, infection, bleeding, blood clot (thrombus formation), and puncture of an artery; nerve damage and irregular heartbeat and possibility to perform a PICC exchange if needed/ordered by physician.  Alternatives to this procedure were also discussed.  Bard Power PICC patient education guide, fact sheet on infection prevention and patient information card has been provided to patient /or left at bedside.  Telephone consent obtained from sister. ? ?PICC Placement Documentation  ?PICC Triple Lumen 08/23/21 Right Basilic 35 cm 1 cm (Active)  ?Indication for Insertion or Continuance of Line Vasoactive infusions;Administration of hyperosmolar/irritating solutions (i.e. TPN, Vancomycin, etc.);Chronic illness with exacerbations (CF, Sickle Cell, etc.);Prolonged intravenous therapies;Limited venous access - need for IV therapy >5 days (PICC only) 08/23/21 1216  ?Exposed Catheter (cm) 1 cm 08/23/21 1216  ?Site Assessment Clean, Dry, Intact 08/23/21 1216  ?Lumen #1 Status Flushed;Saline locked;Blood return noted 08/23/21 1216  ?Lumen #2 Status Flushed;Saline locked;Blood return noted 08/23/21 1216  ?Lumen #3 Status Flushed;Saline locked;Blood return noted 08/23/21 1216  ?Dressing Type Securing device;Transparent 08/23/21 1216  ?Dressing Status Antimicrobial disc in place;Clean, Dry, Intact 08/23/21 1216  ?Safety Lock Not Applicable 08/23/21 1216  ?Line Care Connections checked and tightened 08/23/21 1216  ?Line Adjustment (NICU/IV Team Only) No 08/23/21 1216  ?Dressing Intervention New dressing 08/23/21 1216  ?Dressing Change  Due 08/30/21 08/23/21 1216  ? ? ? ? ? ?Elliot Dallyiggs, Carrie [name] ?08/23/2021, 12:17 PM ? ?

## 2021-08-23 NOTE — Progress Notes (Signed)
Nutrition Follow-up ? ?INTERVENTION:  ? ?Monitor magnesium, potassium, and phosphorus BID for at least 3 days, MD to replete as needed, as pt is at risk for refeeding syndrome given poor PO intakes over the past week. ? ?-Initiate Vital AF 1.2 @ 20 ml/hr ?-Advance by 10 ml every 4 hours to goal rate of 60 ml/hr. ?-Provides 1728 kcals, 108g protein and 1167 ml H2O ? ?-Juven BID per tube, each serving provides 95kcal and 2.5g of protein (amino acids glutamine and arginine)  ? ?-Changed Multivitamin with minerals daily to ?Per tube ? ?NUTRITION DIAGNOSIS:  ? ?Increased nutrient needs related to acute illness, wound healing as evidenced by estimated needs. ? ?Ongoing. ? ?GOAL:  ? ?Patient will meet greater than or equal to 90% of their needs ? ?Not meeting. ? ?MONITOR:  ? ?Vent status, Labs, Weight trends, I & O's, Skin ? ?REASON FOR ASSESSMENT:  ? ?Consult ?Enteral/tube feeding initiation and management ? ?ASSESSMENT:  ? ?61 y.o. female past medical history of HTN, HLD, depression, anxiety, dermatomyositis, rheumatoid arthritis on Plaquenil, mycophenolate, and daily prednisone, and fatty liver. She presented to the ED due to AMS, abdominal pain, and several days of constipation with significantly decreased PO intake. Her niece reported that prednisone was decreased and then subsequently needed to be increased recently. Her niece reported that patient has had similar symptoms in the past when steroids have been tapered. She was admitted for lactic acidosis. ? ?Patient was deemed unsafe to consume any POs prior to intubation. ?RD consulted to start tube feeds today. ? ?Patient is currently intubated on ventilator support following a seizure 3/17.  ?MV: 11.3 L/min ?Temp (24hrs), Avg:98.6 ?F (37 ?C), Min:96.9 ?F (36.1 ?C), Max:99.6 ?F (37.6 ?C) ? ?Per chart review, pt to have lumbar puncture today.  ?  ?Admission weight: 140 lbs. ?Last weight 3/15: 136 lbs ? ?Medications: Colace, IV folic acid, Multivitamin with minerals  daily, Miralax, senokot, Levophed, IV Thiamine ? ?Lab reviewed: ?CBGs: [id number] ? ?Diet Order:   ?Diet Order   ? ?       ?  Diet NPO time specified  Diet effective now       ?  ? ?  ?  ? ?  ? ? ?EDUCATION NEEDS:  ? ?No education needs have been identified at this time ? ?Skin:  Skin Assessment: Skin Integrity Issues: ?Skin Integrity Issues:: Stage III ?Stage III: R hand and sacrum ? ?Last BM:  3/17 -type 2 ? ?Height:  ? ?Ht Readings from Last 1 Encounters:  ?08/20/21 5\' 1"  (1.549 m)  ? ? ?Weight:  ? ?Wt Readings from Last 1 Encounters:  ?08/20/21 62 kg  ? ? ?Ideal Body Weight:  47.7 kg ? ?BMI:  Body mass index is 25.83 kg/m?. ? ?Estimated Nutritional Needs:  ? ?Kcal:  1900-2100 kcal ? ?Protein:  95-110 grams ? ?Fluid:  >/= 2 L/day ? ? ?Clayton Bibles, MS, RD, LDN ?Inpatient Clinical Dietitian ?Contact information available via Amion ? ?

## 2021-08-23 NOTE — Progress Notes (Signed)
Post lumbar puncture today ?Remains encephalopathic, seizure 08/22/21 ? ?Needs continuous EEG monitoring ? ?Order placed for transfer to neuro ICU ?

## 2021-08-23 NOTE — Progress Notes (Signed)
Id brief note ? ? ?[facility] however [facility] ? ?Csf without pleocytosis or abnormal glucose. Protein slightly elevated.  ?Neurology concern for autoimmune encephalitis and will start pulse steroid ? ?Multiple ID serology testing pending ? ? ?-will see tomorrow on follow up ?-she remains on acyclovir at this time ?

## 2021-08-23 NOTE — Progress Notes (Signed)
eLink Physician-Brief Progress Note ?Patient Name: Carrie CoxLivia S  ?DOB: 03/19/1961 ?MRN: [id number] ? ? ?Date of Service ? 08/23/2021  ?HPI/Events of Note ? Hgb 6.9  ?eICU Interventions ? Transfusion 1 unit prbc ordered  ? ? ? ?Intervention Category ?Intermediate Interventions: Bleeding - evaluation and treatment with blood products ? ?Henry RusselSMITH, Vernon Ariel, P ?08/23/2021, 7:18 AM ?

## 2021-08-23 NOTE — Progress Notes (Signed)
? ?NAME:  Carrie [name], MRN:  [id number], DOB:  01/10/1961, LOS: 5 ?ADMISSION DATE:  08/30/2021, CONSULTATION DATE:  08/22/2021 ?REFERRING MD: Dr. Lama, CHIEF COMPLAINT: Respiratory failure, metabolic encephalopathy ? ?History of Present Illness:  ?Patient with metabolic encephalopathy, concern for autoimmune encephalitis, concern for meningitis ?History of rheumatoid arthritis on hydroxychloroquine, mycophenolate, prednisone ?History of dermatomyositis ?Did have a seizure 3/17 ?Pancytopenia ? ?Pertinent  Medical History  ? ?Past Medical History:  ?Diagnosis Date  ? Depression with anxiety 05/26/2021  ? Dermatomyositis (HCC) 06/23/2021  ? Fatty liver 06/23/2021  ? Hyperlipidemia   ? Hypertension   ? Non-traumatic rhabdomyolysis   ? Pre-syncope 03/26/2021  ? Rheumatoid arthritis flare (HCC) 04/08/2021  ? ?Significant Hospital Events: ?Including procedures, antibiotic start and stop dates in addition to other pertinent events   ?MRI 3/13-no acute findings ?3/17 bone marrow biopsy ?3/12 CT abdomen and pelvis-no acute findings, adrenal nodule ?3/12 CT head without contrast-no acute findings ?3/16 EEG-moderate diffuse encephalopathy of nonspecific etiology, no seizures or epileptiform discharges noted during the recording ? ?Interim History / Subjective:  ?Altered mental status ?Required minimal sedation ?Mental status at baseline-still very altered, not able to follow commands ? ?Objective   ?Blood pressure 113/81, pulse (!) 108, temperature 99.5 ?F (37.5 ?C), temperature source Axillary, resp. rate 18, height 5' 1" (1.549 m), weight 62 kg, SpO2 97 %. ?   ?Vent Mode: PRVC ?FiO2 (%):  [50 %-100 %] 50 % ?Set Rate:  [16 bmp] 16 bmp ?Vt Set:  [380 mL] 380 mL ?PEEP:  [5 cmH20] 5 cmH20 ?Plateau Pressure:  [12 cmH20-19 cmH20] 13 cmH20  ? ?Intake/Output Summary (Last 24 hours) at 08/23/2021 0918 ?Last data filed at 08/23/2021 0644 ?Gross per 24 hour  ?Intake 2090.25 ml  ?Output 450 ml  ?Net 1640.25 ml  ? ?Filed Weights  ? 08/06/2021  0422 08/18/21 0500 08/20/21 1511  ?Weight: 63.5 kg 60.6 kg 62 kg  ? ? ?Examination: ?General: Middle-aged lady, chronically ill-appearing ?HENT: Endotracheal tube in place, 7.5 endotracheal tube ?Lungs: Decreased air entry bilaterally but clear ?Cardiovascular: S1-S2 appreciated with no murmur ?Abdomen: Bowel sounds appreciated ?Extremities: No clubbing, no edema ?Neuro: Sedated, not following commands ?GU: Fair output ? ?Resolved Hospital Problem list   ? ? ?Assessment & Plan:  ?Acute metabolic encephalopathy ?Concern for meningitis/encephalitis ?Concern for autoimmune encephalitis ?-For lumbar puncture today ?-Pancytopenia-to receive platelet transfusion ?-MRI is unremarkable ?-Remains on acyclovir, Rocephin pending lumbar puncture ?-Appreciate ID input ? ?one episode of seizure on 08/22/2021 ?-We will continue to monitor ?-Did have EEG on the 16th not showing active seizures or epileptiform discharges being responsible for altered mentation ? ?History of rheumatoid arthritis ?-Chronic medications on hold ?-Was on mycophenolate, hydrochloric C chloroquine, chronic prednisone ?-On stress dose steroids at present ? ?DIC ?-Received cryoprecipitate ?-Received PRBC ?-Repeat DIC panel at 1400 hrs. ? ?Transaminitis ?-Continue to trend ? ?Lactic acidosis ?Metabolic acidosis ?-Continue to monitor closely ? ?Hyponatremia ?-Continue to monitor closely ? ?Nutrition ?-Will start nutrition today ? ?Follow lumbar puncture today ?For possible PICC placement today ? ?Best Practice (right click and "Reselect all SmartList Selections" daily)  ? ?Diet/type: tubefeeds ?DVT prophylaxis: other ?GI prophylaxis: PPI ?Lines: N/A ?Foley:  N/A ?Code Status:  full code ?Last date of multidisciplinary goals of care discussion [did discuss fully with sister Ms. Marshall 08/22/2021] ? ?Labs   ?CBC: ?Recent Labs  ?Lab 08/19/21 ?0806 08/20/21 ?0536 08/21/21 ?0256 08/21/21 ?0835 08/22/21 ?0249 08/22/21 ?1357 08/22/21 ?2135 08/23/21 ?0242  ?WBC 2.2*    < >   3.5* 3.9* 3.7*  --  7.1 9.1  ?NEUTROABS 1.8  --   --  2.7  --   --   --   --   ?HGB 8.8*   < > 8.4* 8.2* 7.7*  --  7.9* 8.7*  ?HCT 26.0*   < > 25.1* 23.9* 22.6*  --  24.4* 24.9*  ?MCV 88.7   < > 88.4 86.6 86.3  --  92.4 89.9  ?PLT 58*   < > 42* 47* 38* 41* 45* 42*  ? < > = values in this interval not displayed.  ? ? ?Basic Metabolic Panel: ?Recent Labs  ?Lab 08/27/2021 ?0502 08/22/2021 ?0800 08/18/21 ?0603 08/20/21 ?0536 08/21/21 ?0256 08/22/21 ?0249 08/22/21 ?2135 08/23/21 ?0242  ?NA 134*  --    < > 135 126* 126* 127* 126*  ?K 3.7  --    < > 3.8 3.6 3.1* 3.9 3.8  ?CL 107  --    < > 107 99 99 102 99  ?CO2 17*  --    < > 19* 17* 18* 16* 18*  ?GLUCOSE 113*  --    < > 60* 127* 209* 180* 218*  ?BUN 36*  --    < > 17 15 15 13 16  ?CREATININE 1.37*  --    < > 0.80 0.77 0.86 0.79 1.03*  ?CALCIUM 9.3  --    < > 8.8* 8.0* 7.8* 7.2* 7.3*  ?MG 1.9  --   --   --   --  1.6* 2.2  --   ?PHOS  --  2.8  --   --   --   --   --   --   ? < > = values in this interval not displayed.  ? ?GFR: ?Estimated Creatinine Clearance: 47.8 mL/min (A) (by C-G formula based on SCr of 1.03 mg/dL (H)). ?Recent Labs  ?Lab 08/13/2021 ?0502 08/10/2021 ?1149 08/18/21 ?0603 08/21/21 ?0835 08/22/21 ?0249 08/22/21 ?2135 08/23/21 ?0242  ?WBC 3.7*  --    < > 3.9* 3.7* 7.1 9.1  ?LATICACIDVEN 3.1* 2.0*  --   --   --   --   --   ? < > = values in this interval not displayed.  ? ? ?Liver Function Tests: ?Recent Labs  ?Lab 08/18/21 ?0603 08/21/21 ?0256 08/22/21 ?0249 08/22/21 ?2135 08/23/21 ?0242  ?AST 43* 124* 142* 132* 119*  ?ALT 21 48* 56* 62* 58*  ?ALKPHOS 50 127* 197* 208* 226*  ?BILITOT 0.8 1.1 1.0 1.2 1.5*  ?PROT 3.9* 3.7* 3.6* 3.6* 3.6*  ?ALBUMIN 2.1* 1.9* 2.0* 1.8* 1.8*  ? ?Recent Labs  ?Lab 08/26/2021 ?0502  ?LIPASE 40  ? ?Recent Labs  ?Lab 08/13/2021 ?1149  ?AMMONIA 23  ? ? ?ABG ?   ?Component Value Date/Time  ? PHART 7.39 08/22/2021 2042  ? PCO2ART 25 (L) 08/22/2021 2042  ? PO2ART 323 (H) 08/22/2021 2042  ? HCO3 15.4 (L) 08/22/2021 2042  ? ACIDBASEDEF 8.0 (H)  08/22/2021 2042  ? O2SAT 99.3 08/22/2021 2042  ?  ? ?Coagulation Profile: ?Recent Labs  ?Lab 08/22/2021 ?0502 08/21/21 ?1351 08/22/21 ?1357  ?INR 1.1 1.3* 1.2  ? ? ?Cardiac Enzymes: ?Recent Labs  ?Lab 08/28/2021 ?0859 08/19/21 ?0806  ?CKTOTAL 49 45  ? ? ?HbA1C: ?Hgb A1c MFr Bld  ?Date/Time Value Ref Range Status  ?08/16/2021 05:02 AM 6.2 (H) 4.8 - 5.6 % Final  ?  Comment:  ?  (NOTE) ?        Prediabetes: 5.7 - 6.4 ?          Diabetes: >6.4 ?        Glycemic control for adults with diabetes: <7.0 ?  ?11/29/2014 10:47 AM 6.0 4.6 - 6.5 % Final  ?  Comment:  ?  Glycemic Control Guidelines for People with Diabetes:Non Diabetic:  <6%Goal of Therapy: <7%Additional Action Suggested:  >8%   ? ? ?CBG: ?Recent Labs  ?Lab 08/22/21 ?1354 08/22/21 ?2023 08/22/21 ?2340 08/23/21 ?0420 08/23/21 ?0759  ?GLUCAP 110* 167* 159* 171* 213*  ? ? ?Review of Systems:   ?Unable to provide info ? ?Past Medical History:  ?She,  has a past medical history of Depression with anxiety (05/26/2021), Dermatomyositis ([place]) (06/23/2021), Fatty liver (06/23/2021), Hyperlipidemia, Hypertension, Non-traumatic rhabdomyolysis, Pre-syncope (03/26/2021), and Rheumatoid arthritis flare ([place]) (04/08/2021).  ? ?Surgical History:  ? ?Past Surgical History:  ?Procedure Laterality Date  ? ABDOMINAL HYSTERECTOMY    ? REDUCTION MAMMAPLASTY    ?  ? ?Social History:  ? reports that she quit smoking about 7 years ago. Her smoking use included cigarettes. She has never used smokeless tobacco. She reports that she does not currently use alcohol. She reports that she does not use drugs.  ? ?Family History:  ?Her family history includes Breast cancer in her mother and another family member; Breast cancer (age of onset: 74) in her sister; Coronary artery disease in an other family member; Diabetes in her sister; Hypertension in an other family member.  ? ?Allergies ?Allergies  ?Allergen Reactions  ? Zoloft [Sertraline Hcl] Diarrhea and Other (See Comments)  ?  Stomach upset ?  ?   ?The patient is critically ill with multiple organ systems failure and requires high complexity decision making for assessment and support, frequent evaluation and titration of therapies, application of adv

## 2021-08-23 NOTE — Progress Notes (Signed)
LP with zero white cells, elevated protein at 69. ? ?At this point, given the report of a history of worsening mental status when steroids are weaned, presence of multiple autoimmune diseases developed in the past few months, and new onset severe encephalopathy with seizures, I think that the possibility of autoimmune encephalitis is actually quite high, and I would favor pulse dose steroids at this time. ? ?1) Solu-Medrol 1 g daily ?2) LTM EEG ?3) we will continue to follow ? ?Ritta Slot, MD ?[facility] ?[phone] ? ?If 7pm- 7am, please page neurology on call as listed in AMION. ? ?

## 2021-08-23 NOTE — Progress Notes (Signed)
Subjective: ?She had a seizure overnight.  She was intubated. ? ?Exam: ?Vitals:  ? 08/23/21 1124 08/23/21 1136  ?BP: 122/78 113/83  ?Pulse: (!) 101 99  ?Resp: 20 20  ?Temp: 99.1 ?F (37.3 ?C) 99 ?F (37.2 ?C)  ?SpO2: 98% 96%  ? ?Gen: In bed, NAD ?Resp: non-labored breathing, no acute distress ?Abd: soft, nt ? ?Neuro: ?MS: Eyes open minimally to noxious stimulation, does not follow commands ?CN: Pupils equal round and reactive, eyes are conjugate ?Motor: She withdraws to noxious stimulation in all four extremities, attempt to localize bilaterally. ?Sensory: As above ? ?Pertinent Labs: ?Sodium 126 ?Calcium 7.3 with an albumin of 1.8 ?Creatinine 1.03 ?AST and ALT are both mildly elevated ?Ammonia from 3/12 was 23 ?TSH 2.47 ?Adamts13 negative ? ?MRI brain 3/13-Motion degraded but unremarkable ? ?Impression: 61 year old female with a history of rheumatoid arthritis diagnosed in November as well as subsequent diagnosis of dermatomyositis.  She presented on 3/12 with decreased oral intake, abdominal pain and constipation, and AMS.  Due to her mental status continuing to be poor on 3/16 neurology was consulted.  At that time, she had also become pancytopenic with presumed DIC of unclear etiology.  Given her thrombocytopenia, LP was not immediately attempted, and she was started on acyclovir. ? ?Interestingly, family reports confusion since November with worsening anytime she had steroids attempted to be weaned.  Given the worsening mental status, new onset seizures without clear etiology, I do think that CSF sampling is critical.  She is at risk for opportunistic infections given her longstanding steroid use, but my suspicion is that this may represent an autoimmune process. ? ?I do not think she is currently seizing, but she will need long-term EEG monitoring given her persistent obtundation and history of seizure. ? ?Recommendations: ?1) LP for cells, glucose, protein, culture, fungal stain with culture, hsv, vzv, ace,  vdrl, autoimmune panel, CSF RF ?2) transfer to [facility] for LTM EEG ?3) If CSF is non-infectious would consider high dose steroids ? ?Ritta Slot, MD ?[facility] ?[phone] ? ?If 7pm- 7am, please page neurology on call as listed in AMION. ? ?

## 2021-08-23 NOTE — Progress Notes (Signed)
50 mcg Fentanyl wasted with Charge RN Genell -  ?

## 2021-08-23 NOTE — Progress Notes (Signed)
LTM EEG hooked up and running - no initial skin breakdown - push button tested - neuro notified. [facility] monitoring.  

## 2021-08-23 NOTE — Progress Notes (Signed)
EEG is slow, but no clear seizure activity. Will continue LTM.  ? ?Ritta Slot, MD ?[facility] ?[phone] ? ?If 7pm- 7am, please page neurology on call as listed in AMION. ? ?

## 2021-08-23 NOTE — Progress Notes (Signed)
Lumbar puncture looks non infectious ? ?Agree with trial with high dose steroids for autoimmune encephalitis ?

## 2021-08-23 NOTE — Progress Notes (Signed)
eLink Physician-Brief Progress Note ?Patient Name: Carrie CoxLivia [name]  ?DOB: 11/06/1960 ?MRN: [id number] ? ? ?Date of Service ? 08/23/2021  ?HPI/Events of Note ? Patient with oliguria and soft blood pressures, she is on NS gtt at 125 ml / hour.  ?eICU Interventions ? NS 500 ml fluid bolus ordered x 1.  ? ? ? ?  ? ?Migdalia Dkkoronkwo U Sonakshi Rolland ?08/23/2021, 11:06 PM ?

## 2021-08-23 NOTE — Progress Notes (Signed)
Patient arrived to [facility] from [facility] ICU without any belongings. [facility] mentioned she had belongings at [facility] including a gold ring stored in a pill crusher. Pt with new PICC as of today and several PIVs including an IO. We will remove those as soon as we know PICC is safe to use. 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 It is 1x1x1cm. She also had a stage 4 to her right hand medial to her first finger knuckle approx 1.5x1.5cm. A wound care consult was placed. Two areas on the back of her skull bilateral with thinned hair, red skin, and softer to palpation. Right groin site (from old CL placement attempt) with bruising and a small hard nodule. See images below.  ? ? ? ?Right hand above.  ? ? ? ?Sacrum above.  ? ?Gila Lauf, Rande Brunt, RN  ?

## 2021-08-23 NOTE — Progress Notes (Signed)
PT Cancellation Note ? ?Patient Details ?Name: Pershing CoxLivia S  ?MRN: [id number] ?DOB: 06/07/1961 ? ? ?Cancelled Treatment:    Reason Eval/Treat Not Completed: Medical issues which prohibited therapy (pt intubated yesterday. Will follow.) ? ? ?Ralene BatheUhlenberg, Lindsay Straka Kistler PT 08/23/2021  ?Acute Rehabilitation Services ?Pager [phone] ?Office [phone] ? ?

## 2021-08-23 NOTE — Progress Notes (Addendum)
Pharmacy Antibiotic Note ? ?[name] is a 61 y.o. female admitted on 2021-09-11 with rule out HSV encephalitis.  Pharmacy has been consulted for Acyclovir dosing. ?08/23/2021 ?SCr up to 1.03, CrCl ~ 47.8 ml/min.  UOP 450 ml/24hrs  ?Pt intubated, on Levo @ 18 via IO access ?IVFs increased to 125 at start of acyclovir to prevent nephrotoxicity> currently off. ? Due to limited IV access ? ?Plan: ?- decrease Acyclovir 620 mg (10 mg/kg) IV to every 12 hours for CrCl 30-50 ?-continue IVF of D5NS @ 125 cc/hr while on Acyclovir- will f/u w/ RN to see if able to resume this ?- Will continue to follow renal function, culture results, and LOT plans ? ?Height: 5\' 1"  (154.9 cm) ?Weight: 62 kg (136 lb 11 oz) ?IBW/kg (Calculated) : 47.8 ? ?Temp (24hrs), Avg:98.1 ?F (36.7 ?C), Min:96.9 ?F (36.1 ?C), Max:99.6 ?F (37.6 ?C) ? ?Recent Labs  ?Lab 2021/09/11 ?0502 09-11-21 ?1149 08/18/21 ?0603 08/20/21 ?2482 08/21/21 ?0256 08/21/21 ?5003 08/22/21 ?0249 08/22/21 ?2135 08/23/21 ?0242  ?WBC 3.7*  --    < > 3.0* 3.5* 3.9* 3.7* 7.1 9.1  ?CREATININE 1.37*  --    < > 0.80 0.77  --  0.86 0.79 1.03*  ?LATICACIDVEN 3.1* 2.0*  --   --   --   --   --   --   --   ? < > = values in this interval not displayed.  ? ?  ?Estimated Creatinine Clearance: 47.8 mL/min (A) (by C-G formula based on SCr of 1.03 mg/dL (H)).   ? ?Allergies  ?Allergen Reactions  ? Zoloft [Sertraline Hcl] Diarrhea and Other (See Comments)  ?  Stomach upset ?  ? ? ?Antimicrobials this admission: ?Ceftriaxone 3/12 x 1 ?Acyclovir 3/16>> ?Dose adjustments this admission: ?3/18 acyclovir q8> q12 ?Microbiology results: ?3/12 BCx >> 1/4 Staph auricularis (likely contaminant) ?3/15 MRSA PCR >> neg ?3/16 BCx >>ngtd ?3/16 HIV NR ?3/17 Hep A B C NR ?3/16 crypto Ag neg ? ?Thank you for allowing pharmacy to be a part of this patient?s care. ? ?Herby Abraham, Pharm.D ?08/23/2021 7:53 AM ? ?

## 2021-08-24 ENCOUNTER — Inpatient Hospital Stay (HOSPITAL_COMMUNITY): Payer: Federal, State, Local not specified - PPO

## 2021-08-24 DIAGNOSIS — R0603 Acute respiratory distress: Secondary | ICD-10-CM

## 2021-08-24 DIAGNOSIS — E872 Acidosis, unspecified: Secondary | ICD-10-CM | POA: Diagnosis not present

## 2021-08-24 DIAGNOSIS — D61818 Other pancytopenia: Secondary | ICD-10-CM | POA: Diagnosis not present

## 2021-08-24 DIAGNOSIS — R4182 Altered mental status, unspecified: Secondary | ICD-10-CM | POA: Diagnosis not present

## 2021-08-24 LAB — BPAM RBC
Blood Product Expiration Date: 202303302359
Blood Product Expiration Date: 202304042359
ISSUE DATE / TIME: 202303172038
ISSUE DATE / TIME: 202303180845
Unit Type and Rh: 600
Unit Type and Rh: 600

## 2021-08-24 LAB — POCT I-STAT 7, (LYTES, BLD GAS, ICA,H+H)
Acid-base deficit: 11 mmol/L — ABNORMAL HIGH (ref 0.0–2.0)
Bicarbonate: 17 mmol/L — ABNORMAL LOW (ref 20.0–28.0)
Calcium, Ion: 1.23 mmol/L (ref 1.15–1.40)
HCT: 25 % — ABNORMAL LOW (ref 36.0–46.0)
Hemoglobin: 8.5 g/dL — ABNORMAL LOW (ref 12.0–15.0)
O2 Saturation: 82 %
Patient temperature: 36.9
Potassium: 4 mmol/L (ref 3.5–5.1)
Sodium: 134 mmol/L — ABNORMAL LOW (ref 135–145)
TCO2: 18 mmol/L — ABNORMAL LOW (ref 22–32)
pCO2 arterial: 45.2 mmHg (ref 32–48)
pH, Arterial: 7.184 — CL (ref 7.35–7.45)
pO2, Arterial: 58 mmHg — ABNORMAL LOW (ref 83–108)

## 2021-08-24 LAB — TYPE AND SCREEN
ABO/RH(D): A NEG
Antibody Screen: NEGATIVE
Unit division: 0
Unit division: 0

## 2021-08-24 LAB — BPAM PLATELET PHERESIS
Blood Product Expiration Date: 202303202359
Blood Product Expiration Date: 202303202359
ISSUE DATE / TIME: 202303180922
ISSUE DATE / TIME: 202303181112
Unit Type and Rh: 5100
Unit Type and Rh: 5100

## 2021-08-24 LAB — ECHOCARDIOGRAM COMPLETE
AR max vel: 2.52 cm2
AV Area VTI: 2.81 cm2
AV Area mean vel: 2.3 cm2
AV Mean grad: 6 mmHg
AV Peak grad: 9.5 mmHg
Ao pk vel: 1.54 m/s
Area-P 1/2: 3.54 cm2
Height: 61 in
S' Lateral: 2.5 cm
Weight: 2186.96 oz

## 2021-08-24 LAB — CBC
HCT: 20.3 % — ABNORMAL LOW (ref 36.0–46.0)
Hemoglobin: 6.9 g/dL — CL (ref 12.0–15.0)
MCH: 30.3 pg (ref 26.0–34.0)
MCHC: 34 g/dL (ref 30.0–36.0)
MCV: 89 fL (ref 80.0–100.0)
Platelets: 75 10*3/uL — ABNORMAL LOW (ref 150–400)
RBC: 2.28 MIL/uL — ABNORMAL LOW (ref 3.87–5.11)
RDW: 17.7 % — ABNORMAL HIGH (ref 11.5–15.5)
WBC: 11.4 10*3/uL — ABNORMAL HIGH (ref 4.0–10.5)
nRBC: 1.2 % — ABNORMAL HIGH (ref 0.0–0.2)

## 2021-08-24 LAB — RENAL FUNCTION PANEL
Albumin: <1.5 g/dL — ABNORMAL LOW (ref 3.5–5.0)
Anion gap: 7 (ref 5–15)
BUN: 19 mg/dL (ref 8–23)
CO2: 14 mmol/L — ABNORMAL LOW (ref 22–32)
Calcium: 5.5 mg/dL — CL (ref 8.9–10.3)
Chloride: 104 mmol/L (ref 98–111)
Creatinine, Ser: 1.16 mg/dL — ABNORMAL HIGH (ref 0.44–1.00)
GFR, Estimated: 53 mL/min — ABNORMAL LOW (ref >60)
Glucose, Bld: 280 mg/dL — ABNORMAL HIGH (ref 70–99)
Phosphorus: 2.8 mg/dL (ref 2.5–4.6)
Potassium: 3 mmol/L — ABNORMAL LOW (ref 3.5–5.1)
Sodium: 125 mmol/L — ABNORMAL LOW (ref 135–145)

## 2021-08-24 LAB — MAGNESIUM
Magnesium: 1.3 mg/dL — ABNORMAL LOW (ref 1.7–2.4)
Magnesium: 2.2 mg/dL (ref 1.7–2.4)

## 2021-08-24 LAB — DIC (DISSEMINATED INTRAVASCULAR COAGULATION)PANEL
D-Dimer, Quant: 5.85 ug/mL-FEU — ABNORMAL HIGH (ref 0.00–0.50)
Fibrinogen: 132 mg/dL — ABNORMAL LOW (ref 210–475)
INR: 1.4 — ABNORMAL HIGH (ref 0.8–1.2)
Platelets: 68 10*3/uL — ABNORMAL LOW (ref 150–400)
Prothrombin Time: 16.7 seconds — ABNORMAL HIGH (ref 11.4–15.2)
Smear Review: NONE SEEN
aPTT: 37 seconds — ABNORMAL HIGH (ref 24–36)

## 2021-08-24 LAB — GLUCOSE, CAPILLARY
Glucose-Capillary: 110 mg/dL — ABNORMAL HIGH (ref 70–99)
Glucose-Capillary: 120 mg/dL — ABNORMAL HIGH (ref 70–99)
Glucose-Capillary: 137 mg/dL — ABNORMAL HIGH (ref 70–99)
Glucose-Capillary: 192 mg/dL — ABNORMAL HIGH (ref 70–99)
Glucose-Capillary: 194 mg/dL — ABNORMAL HIGH (ref 70–99)
Glucose-Capillary: 237 mg/dL — ABNORMAL HIGH (ref 70–99)
Glucose-Capillary: 275 mg/dL — ABNORMAL HIGH (ref 70–99)
Glucose-Capillary: 47 mg/dL — ABNORMAL LOW (ref 70–99)

## 2021-08-24 LAB — PREPARE PLATELET PHERESIS
Unit division: 0
Unit division: 0

## 2021-08-24 LAB — BASIC METABOLIC PANEL
Anion gap: 8 (ref 5–15)
BUN: 23 mg/dL (ref 8–23)
CO2: 15 mmol/L — ABNORMAL LOW (ref 22–32)
Calcium: 6.6 mg/dL — ABNORMAL LOW (ref 8.9–10.3)
Chloride: 105 mmol/L (ref 98–111)
Creatinine, Ser: 1.23 mg/dL — ABNORMAL HIGH (ref 0.44–1.00)
GFR, Estimated: 50 mL/min — ABNORMAL LOW (ref >60)
Glucose, Bld: 277 mg/dL — ABNORMAL HIGH (ref 70–99)
Potassium: 3.4 mmol/L — ABNORMAL LOW (ref 3.5–5.1)
Sodium: 128 mmol/L — ABNORMAL LOW (ref 135–145)

## 2021-08-24 LAB — PREPARE RBC (CROSSMATCH)

## 2021-08-24 LAB — CRYPTOCOCCAL ANTIGEN, CSF: Crypto Ag: NEGATIVE

## 2021-08-24 LAB — PHOSPHORUS
Phosphorus: 2.5 mg/dL (ref 2.5–4.6)
Phosphorus: 3.1 mg/dL (ref 2.5–4.6)

## 2021-08-24 IMAGING — DX DG CHEST 1V PORT
1 series · 1 of 1 positions shown · non-contrast
Comparison: [DATE]

CLINICAL DATA: Abnormal breathing, concern for aspiration

EXAM:
PORTABLE CHEST 1 VIEW

[chest ap]
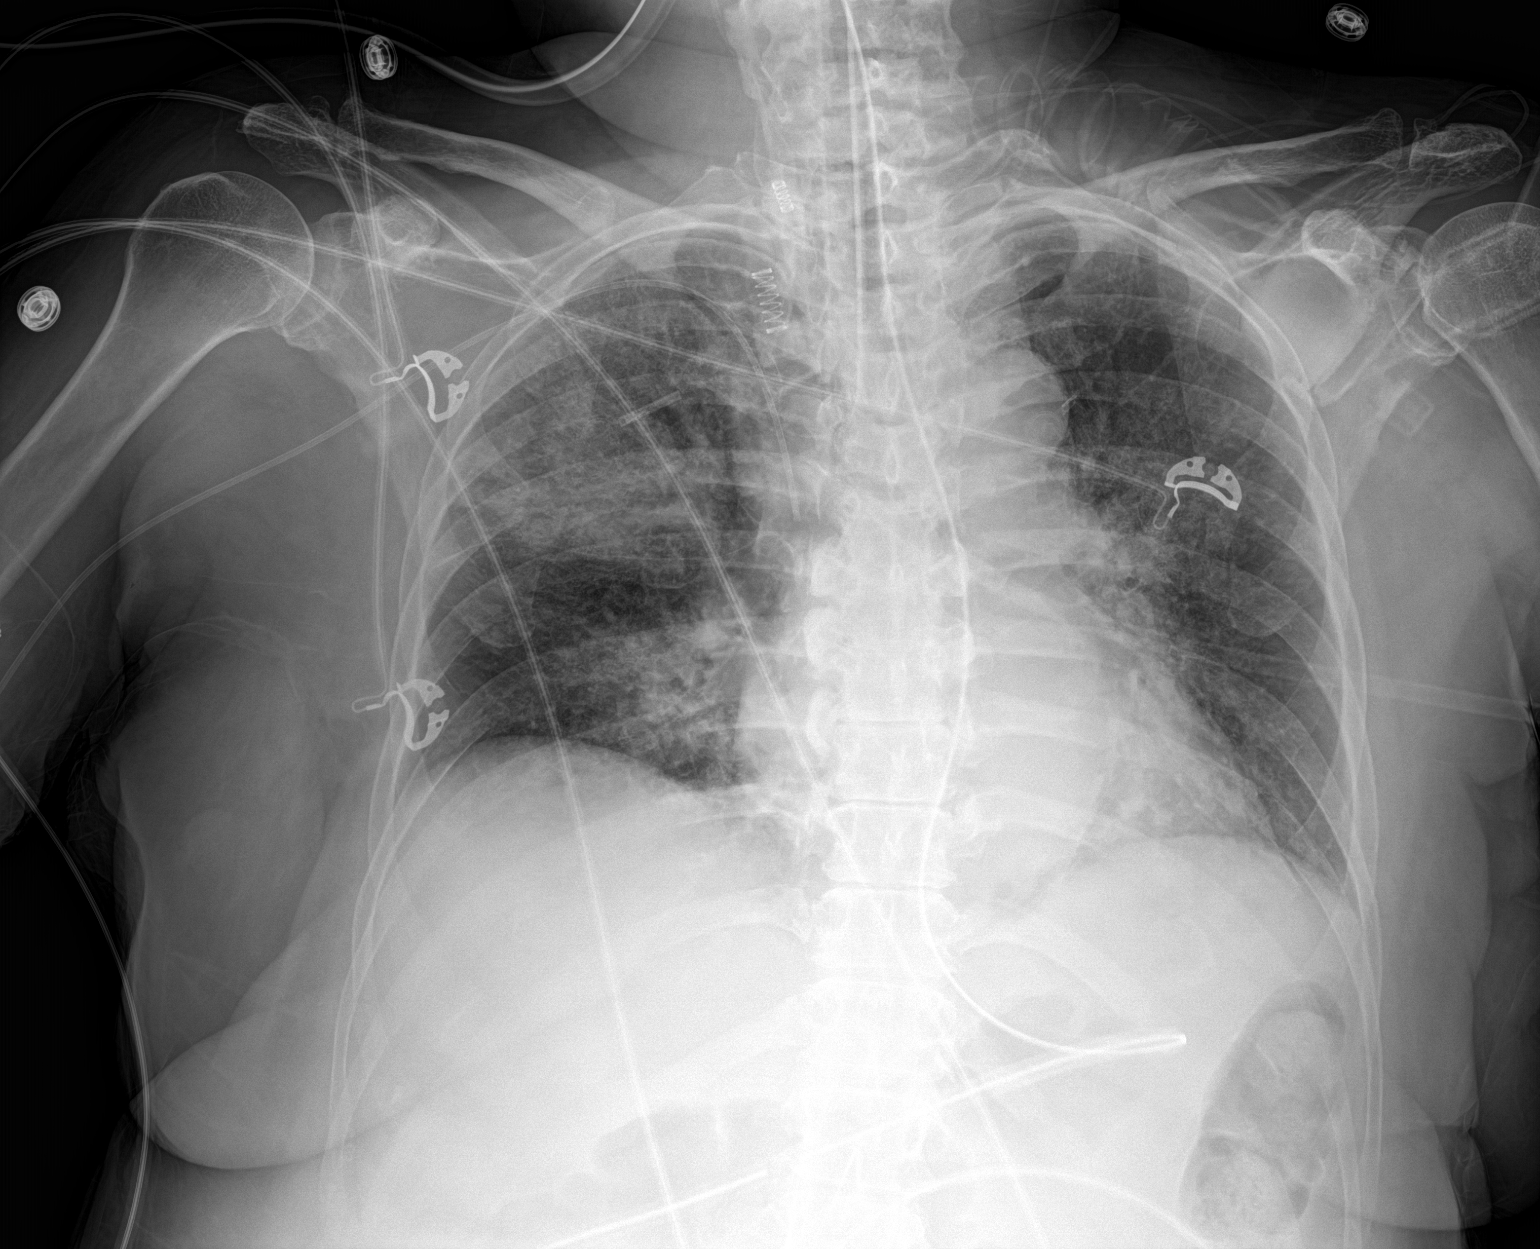

[1 of 1 positions shown; findings below may reference images not displayed]

FINDINGS: Endotracheal tube terminates 2.5 cm above the carina.

Multifocal patchy opacities, right upper lobe predominant,
concerning for multifocal pneumonia, possibly on the basis of
aspiration. This is new from the prior. No pleural effusion or
pneumothorax.

The heart is normal in size.

Enteric tube courses into the proximal duodenum.
IMPRESSION: Multifocal pneumonia, right upper lobe predominant, new and possibly
on the basis of aspiration.

Support apparatus as above.

## 2021-08-24 IMAGING — DX DG ABD PORTABLE 1V
1 series · 2 of 2 positions shown · non-contrast
Comparison: None.

CLINICAL DATA: Nausea, vomiting

EXAM:
PORTABLE ABDOMEN - 1 VIEW

[Series 1: abdomen · 0.14mm/px · 2 of 2 slices shown]
[im 1/2]
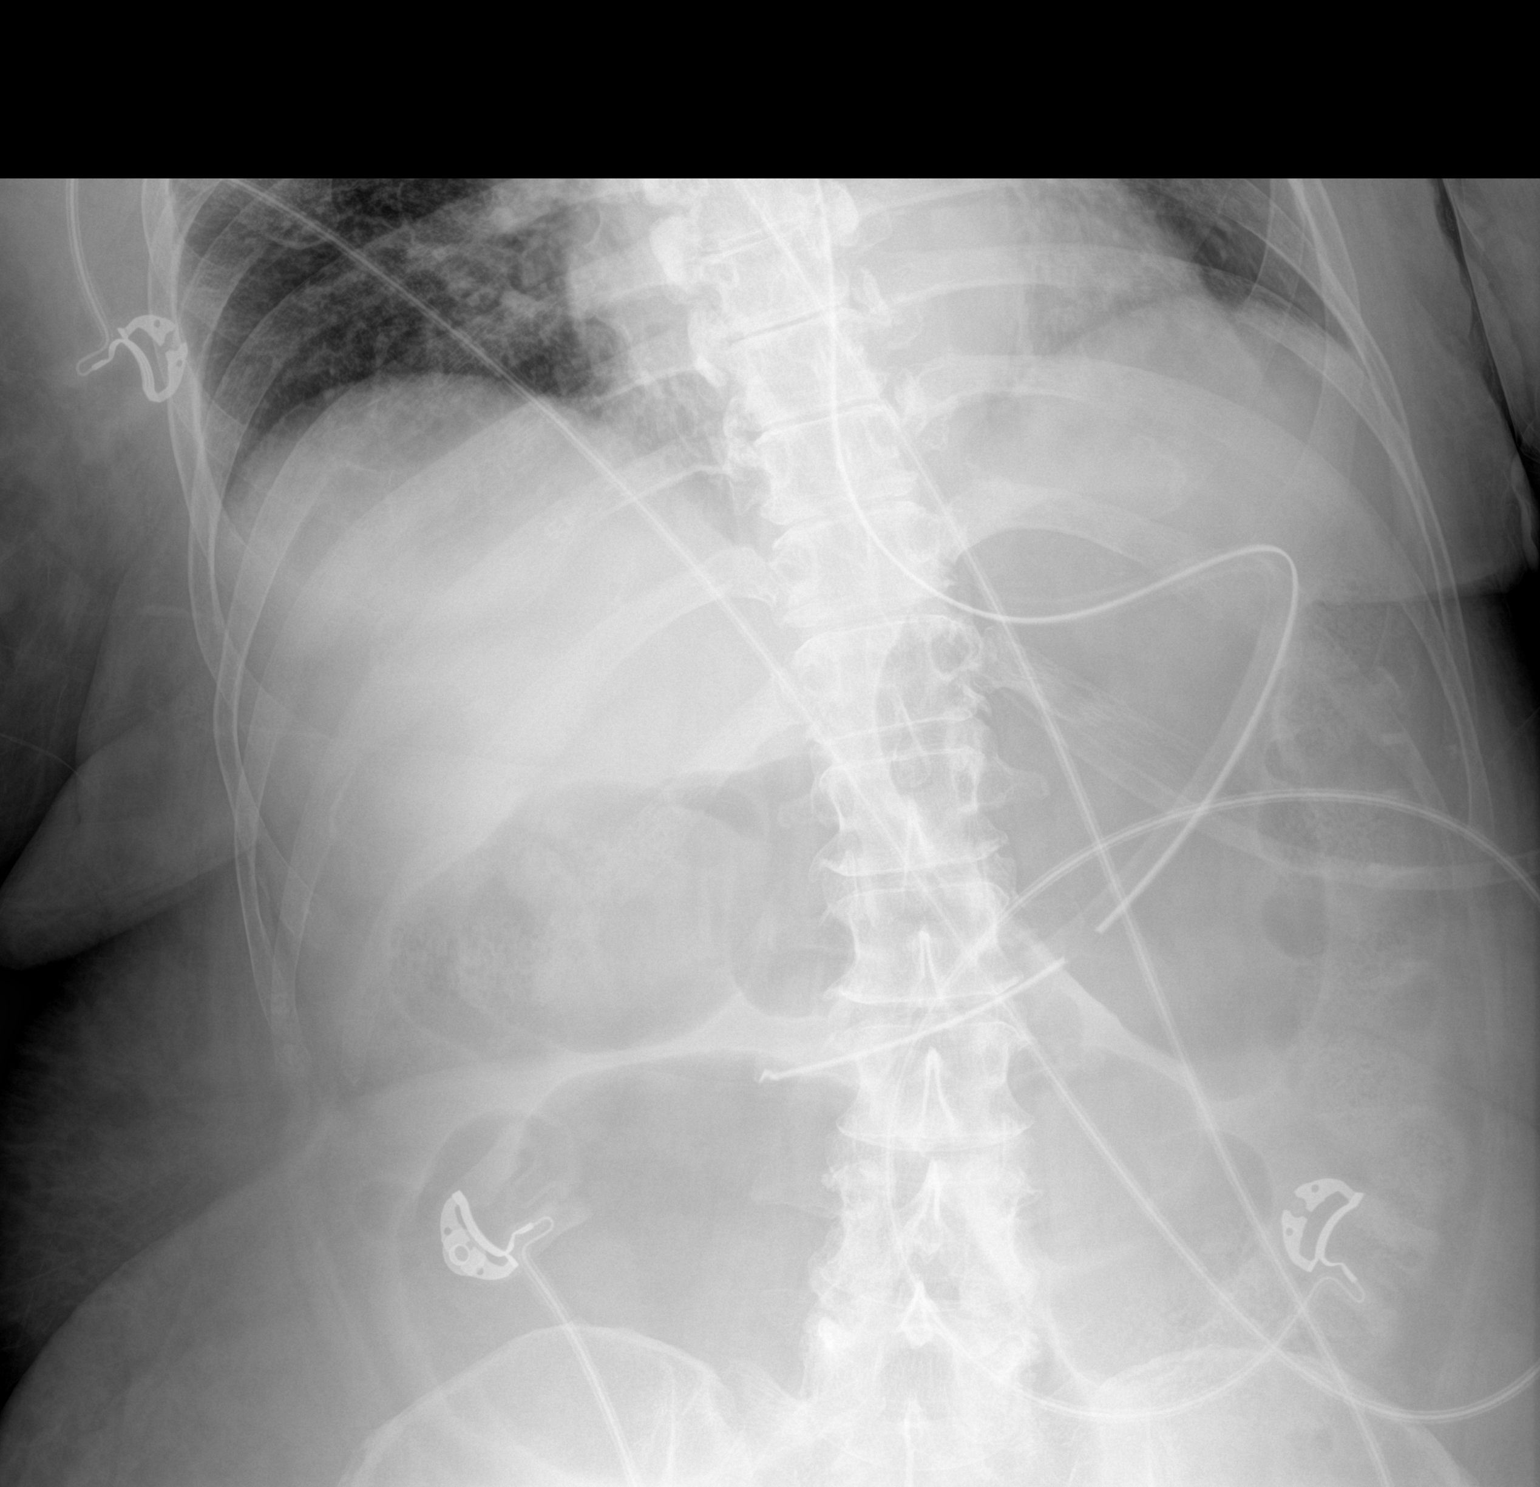
[im 2/2]
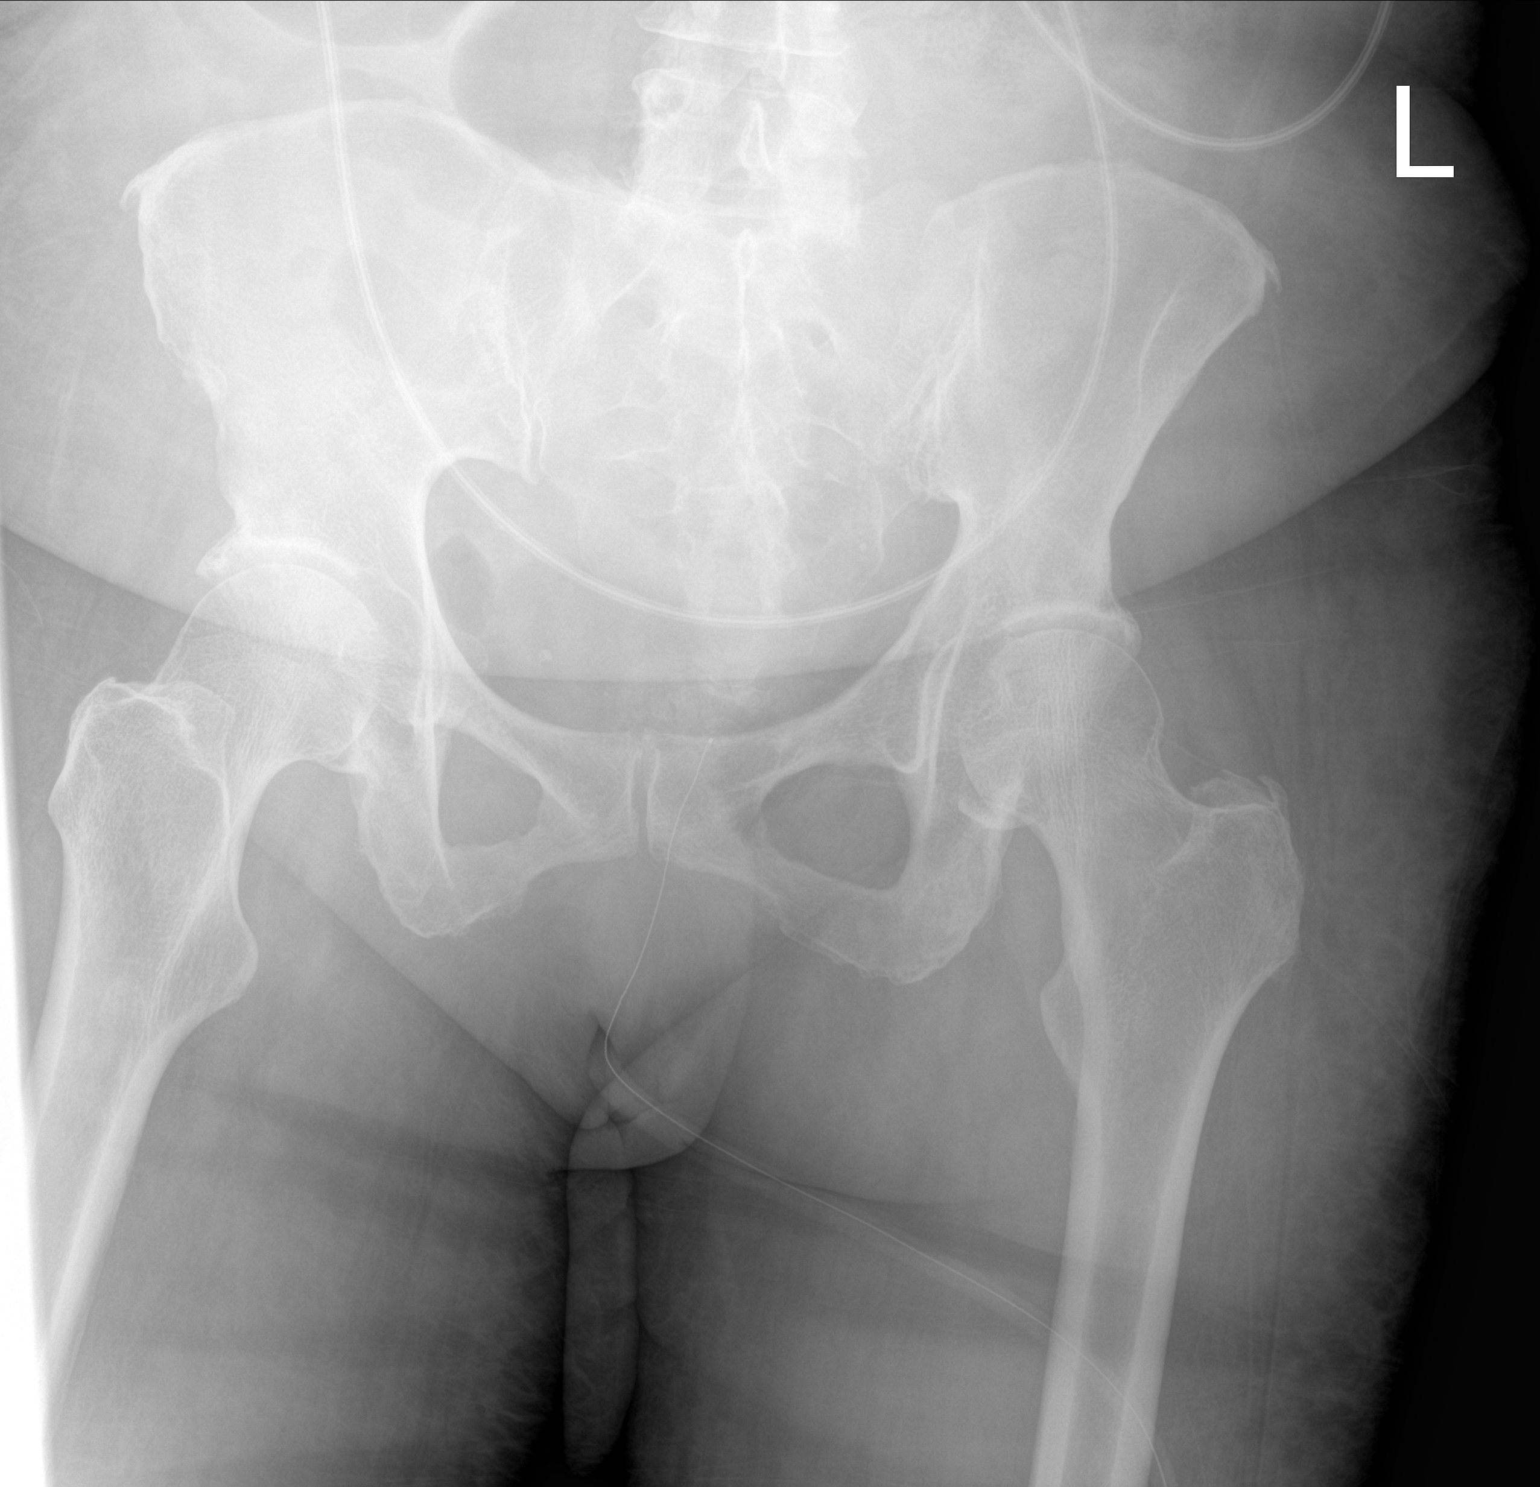

[2 of 2 positions shown; findings below may reference images not displayed]

FINDINGS: Nasogastric tube tip overlies the expected distal body of the
stomach. Nonobstructive bowel gas pattern. No gross free
intraperitoneal gas. No organomegaly. No abnormal calcifications
within the abdomen.
IMPRESSION: Nasogastric tube tip within the distal body of the stomach.

Nonobstructive bowel gas pattern.

## 2021-08-24 MED ORDER — ALBUMIN HUMAN 25 % IV SOLN
50.0000 g | Freq: Once | INTRAVENOUS | Status: AC
  Administered 2021-08-24: 50 g via INTRAVENOUS
  Filled 2021-08-24: qty 200

## 2021-08-24 MED ORDER — FOLIC ACID 1 MG PO TABS
1.0000 mg | ORAL_TABLET | Freq: Every day | ORAL | Status: DC
  Administered 2021-08-24 – 2021-09-21 (×28): 1 mg
  Filled 2021-08-24 (×28): qty 1

## 2021-08-24 MED ORDER — PANTOPRAZOLE 2 MG/ML SUSPENSION
40.0000 mg | Freq: Every day | ORAL | Status: DC
  Administered 2021-08-24: 40 mg
  Filled 2021-08-24: qty 20

## 2021-08-24 MED ORDER — ESCITALOPRAM OXALATE 10 MG PO TABS
20.0000 mg | ORAL_TABLET | Freq: Every day | ORAL | Status: DC
  Administered 2021-08-24 – 2021-09-21 (×28): 20 mg
  Filled 2021-08-24 (×27): qty 2

## 2021-08-24 MED ORDER — POTASSIUM CHLORIDE 20 MEQ PO PACK
40.0000 meq | PACK | ORAL | Status: AC
  Administered 2021-08-24 (×2): 40 meq
  Filled 2021-08-24 (×2): qty 2

## 2021-08-24 MED ORDER — MAGNESIUM SULFATE 2 GM/50ML IV SOLN
2.0000 g | INTRAVENOUS | Status: AC
  Administered 2021-08-24: 2 g via INTRAVENOUS
  Filled 2021-08-24: qty 50

## 2021-08-24 MED ORDER — CALCIUM GLUCONATE-NACL 2-0.675 GM/100ML-% IV SOLN
2.0000 g | Freq: Once | INTRAVENOUS | Status: AC
  Administered 2021-08-24: 2000 mg via INTRAVENOUS
  Filled 2021-08-24: qty 100

## 2021-08-24 MED ORDER — SODIUM BICARBONATE 8.4 % IV SOLN
INTRAVENOUS | Status: DC
  Filled 2021-08-24 (×2): qty 1000

## 2021-08-24 MED ORDER — DEXTROSE 50 % IV SOLN: 25.0000 g | INTRAVENOUS | Status: DC

## 2021-08-24 MED ORDER — FUROSEMIDE 10 MG/ML IJ SOLN
20.0000 mg | Freq: Once | INTRAMUSCULAR | Status: AC
  Administered 2021-08-24: 20 mg via INTRAVENOUS
  Filled 2021-08-24: qty 2

## 2021-08-24 MED ORDER — SODIUM CHLORIDE 0.9 % IV BOLUS
500.0000 mL | Freq: Once | INTRAVENOUS | Status: AC
  Administered 2021-08-24: 500 mL via INTRAVENOUS

## 2021-08-24 MED ORDER — SODIUM CHLORIDE 0.9% IV SOLUTION: Freq: Once | INTRAVENOUS | Status: AC

## 2021-08-24 MED ORDER — SODIUM BICARBONATE 8.4 % IV SOLN
50.0000 meq | Freq: Once | INTRAVENOUS | Status: AC
  Administered 2021-08-24: 50 meq via INTRAVENOUS
  Filled 2021-08-24: qty 50

## 2021-08-24 MED ORDER — POTASSIUM CHLORIDE 20 MEQ PO PACK
40.0000 meq | PACK | Freq: Once | ORAL | Status: AC
  Administered 2021-08-25: 40 meq
  Filled 2021-08-24: qty 2

## 2021-08-24 MED ORDER — STERILE WATER FOR INJECTION IV SOLN
INTRAMUSCULAR | Status: DC
Start: 2021-08-24 — End: 2021-08-24
  Filled 2021-08-24: qty 1000

## 2021-08-24 MED ORDER — LEVETIRACETAM IN NACL 500 MG/100ML IV SOLN
500.0000 mg | Freq: Two times a day (BID) | INTRAVENOUS | Status: DC
  Administered 2021-08-24 – 2021-08-25 (×4): 500 mg via INTRAVENOUS
  Filled 2021-08-24 (×4): qty 100

## 2021-08-24 MED ORDER — DEXTROSE 50 % IV SOLN
INTRAVENOUS | Status: AC
Start: 2021-08-24 — End: 2021-08-24
  Administered 2021-08-24: 50 mL via INTRAVENOUS
  Filled 2021-08-24: qty 50

## 2021-08-24 MED ORDER — DEXTROSE 50 % IV SOLN: 1.0000 | Freq: Once | INTRAVENOUS | Status: AC

## 2021-08-24 NOTE — Progress Notes (Signed)
?  Echocardiogram ?2D Echocardiogram has been performed. ? ?Carrie [name] ?08/24/2021, 3:40 PM ?

## 2021-08-24 NOTE — Progress Notes (Signed)
eLink Physician-Brief Progress Note ?Patient Name: Carrie CoxLivia S  ?DOB: 07/14/1960 ?MRN: [id number] ? ? ?Date of Service ? 08/24/2021  ?HPI/Events of Note ? Urine output improved with fluid bolus but remains sub-optimal.  ?eICU Interventions ? NS 500 ml iv fluid bolus x 1 repeated.  ? ? ? ?  ? ?Migdalia Dkkoronkwo U Loreena Valeri ?08/24/2021, 6:04 AM ?

## 2021-08-24 NOTE — Progress Notes (Addendum)
? ?NAME:  Carrie [name], MRN:  [id number], DOB:  [birth year], LOS: 6 ?ADMISSION DATE:  08/26/2021, CONSULTATION DATE:  3/17 ?REFERRING MD:  [place], CHIEF COMPLAINT:  Confusion, dyspnea  ? ?History of Present Illness:  ?61 y/o female diagnosed with RA in late 2022, then dermatomyositis in early 2023 presented to [facility] with confusion, dyspnea. Required intubation.  Transferred to [facility] for further evaluation.  ? ?Pertinent  Medical History  ?Rheumatoid arthritis ?Dermatomyositis ?Fatty liver ?Hyperlipidemia ?Hypertension ?Rhabdomyolysis ? ?Significant Hospital Events: ?Including procedures, antibiotic start and stop dates in addition to other pertinent events   ?03/2021 labs: ANA positive, Jo-1 neg, centromere neg, ds DNA neg, RNP positive, SSA/SSB pos, chromatin Ab neg, CCP neg ?MRI 3/13-no acute findings ?3/17 bone marrow biopsy ?3/12 CT abdomen and pelvis-no acute findings, adrenal nodule, diverticulosis ?3/12 CT head without contrast-no acute findings ?3/16 EEG-moderate diffuse encephalopathy of nonspecific etiology, no seizures or epileptiform discharges noted during the recording ?3/17 intubation ?3/18 LP performed: no leukocytes, protein elevated, transfer to [facility] for LTM EEG; PICC line placed ? ?Micro ?3/18 csf >  ?3/18 csf vzv >  ?3/16 blood culture >  ?3/12 blood > staph auricularis 1/4 bottles ?3/12 sars cov 2/flu > negative ? ?Abx ?3/12 ceftriaxone x1 ?3/16 acyclovir > ? ? ?Interim History / Subjective:  ?Transferred yesterday to [facility] for LTM EEG monitoring ?LP yesterday> doesn't look infectious, started on high dose steroids ?UOP < 20cc/hr ? ?Objective   ?Blood pressure 117/82, pulse 96, temperature (!) 97.5 ?F (36.4 ?C), resp. rate 13, height $RemoveBe'5\' 1"'mQcPomAuX$  (1.549 m), weight 62 kg, SpO2 94 %. ?   ?Vent Mode: PRVC ?FiO2 (%):  [50 %-80 %] 50 % ?Set Rate:  [16 bmp-18 bmp] 16 bmp ?Vt Set:  [380 mL] 380 mL ?PEEP:  [5 cmH20] 5 cmH20 ?Plateau Pressure:  [15 cmH20] 15 cmH20  ? ?Intake/Output Summary (Last 24 hours) at 08/24/2021  0844 ?Last data filed at 08/24/2021 0800 ?Gross per 24 hour  ?Intake 5426 ml  ?Output 385 ml  ?Net 5041 ml  ? ?Filed Weights  ? 08/27/2021 0422 08/18/21 0500 08/20/21 1511  ?Weight: 63.5 kg 60.6 kg 62 kg  ? ? ?Examination: ? ?General:  In bed on vent ?HENT: NCAT ETT in place ?PULM: CTA B, vent supported breathing ?CV: RRR, no mgr ?GI: BS+, soft, nontender ?MSK: normal bulk and tone ?Neuro: sedated on vent ? ? ?Resolved Hospital Problem list   ? ? ?Assessment & Plan:  ?Acute metabolic encephalopathy > concern for autoimmune encephalitis based on available evidence, no clear sign of infection ?Agree with high dose solumedrol ?Would consider PLEX this week ?PAD protocol, fentanyl infusion per protocol, RASS target 0 to -1 ? ?Circulatory shock> unclear etiology ?check echo ?continue levophed for MAP > 65 ? ?Convulsions on 3/17> no evidence of seizure on EEG ?Per Neurology ? ?Acute respiratory faiure with hypoxemia due to inability to protect airway  ?Full mechanical vent support ?VAP prevention ?Daily WUA/SBT ? ?Transaminitis> unclear etiology, acute hepatitis panel negative, nothing specific on CT abdomen ?Repeat LFT ? ?Pancytopenia > unclear etiology, agree labs are consistent with DIC but also worry that there is an autoimmune process contributing to pancytopenia ?Monitor for bleeding ?Transfuse PRBC for Hgb < 7 gm/dL ?F/u bone marrow biopsy results ?No role for FFP/Cryo right now ?Transfuse platelets if < 50K and bleeding or if a procedure is needed ? 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 ?AKI> worse on 3/19 ?check U/A ?keep euvolemic > check CVP ?Give albumin bolus ?Monitor BMET and UOP ?Replace electrolytes as needed ? ?Hyponatremia> uncertain etiology ?Repeat BMET in AM ?Minimize free water ? ?Hyperglycemia ?SSI ? ? ?Best Practice (right click and "Reselect  all SmartList Selections" daily)  ? ?Diet/type: tubefeeds ?DVT prophylaxis: SCD ?GI prophylaxis: PPI ?Lines: Central line and yes and it is still needed ?Foley:  Yes, and it is still needed ?Code Status:  full code ?Last date of multidisciplinary goals of care discussion [3/17 with sister] ? ?Labs   ?CBC: ?Recent Labs  ?Lab 08/19/21 ?0806 08/20/21 ?9242 08/21/21 ?6834 08/22/21 ?0249 08/22/21 ?1357 08/22/21 ?2135 08/23/21 ?0242 08/23/21 ?1443 08/24/21 ?1962  ?WBC 2.2*   < > 3.9* 3.7*  --  7.1 9.1 8.1 11.4*  ?NEUTROABS 1.8  --  2.7  --   --   --   --   --   --   ?HGB 8.8*   < > 8.2* 7.7*  --  7.9* 8.7* 9.0* 6.9*  ?HCT 26.0*   < > 23.9* 22.6*  --  24.4* 24.9* 25.6* 20.3*  ?MCV 88.7   < > 86.6 86.3  --  92.4 89.9 87.4 89.0  ?PLT 58*   < > 47* 38* 41* 45* 42* 98* 75*  ? < > = values in this interval not displayed.  ? ? ?Basic Metabolic Panel: ?Recent Labs  ?Lab 08/22/21 ?0249 08/22/21 ?2135 08/23/21 ?0242 08/23/21 ?1443 08/24/21 ?2297  ?NA 126* 127* 126* 125* 125*  ?K 3.1* 3.9 3.8 3.6 3.0*  ?CL 99 102 99 101 104  ?CO2 18* 16* 18* 18* 14*  ?GLUCOSE 209* 180* 218* 109* 280*  ?BUN $Rem'15 13 16 22 19  'oYNf$ ?CREATININE 0.86 0.79 1.03* 1.13* 1.16*  ?CALCIUM 7.8* 7.2* 7.3* 6.8* 5.5*  ?MG 1.6* 2.2  --   --  1.3*  ?PHOS  --   --   --   --  2.5  2.8  ? ?GFR: ?Estimated Creatinine Clearance: 42.5 mL/min (A) (by C-G formula based on SCr of 1.16 mg/dL (H)). ?Recent Labs  ?Lab 08/27/2021 ?1149 08/18/21 ?0603 08/22/21 ?2135 08/23/21 ?0242 08/23/21 ?1443 08/24/21 ?9892  ?WBC  --    < > 7.1 9.1 8.1 11.4*  ?LATICACIDVEN 2.0*  --   --   --   --   --   ? < > = values in this interval not displayed.  ? ? ?Liver Function Tests: ?Recent Labs  ?Lab 08/18/21 ?0603 08/21/21 ?0256 08/22/21 ?1194 08/22/21 ?2135 08/23/21 ?1740 08/24/21 ?8144  ?AST 43* 124* 142* 132* 119*  --   ?ALT 21 48* 56* 62* 58*  --   ?ALKPHOS 50 127* 197* 208* 226*  --   ?BILITOT 0.8 1.1 1.0 1.2 1.5*  --   ?PROT 3.9* 3.7* 3.6* 3.6* 3.6*  --   ?ALBUMIN 2.1* 1.9* 2.0* 1.8* 1.8* <1.5*   ? ?No results for input(s): LIPASE, AMYLASE in the last 168 hours. ?Recent Labs  ?Lab 09/05/2021 ?1149  ?AMMONIA 23  ? ? ?ABG ?   ?Component Value Date/Time  ? PHART 7.39 08/22/2021 2042  ? PCO2ART 25 (L) 08/22/2021 2042  ? PO2ART 323 (H) 08/22/2021 2042  ? HCO3 15.4 (L) 08/22/2021 2042  ? ACIDBASEDEF 8.0 (H) 08/22/2021 2042  ? O2SAT 99.3 08/22/2021 2042  ?  ? ?Coagulation Profile: ?Recent Labs  ?Lab 08/21/21 ?1351 08/22/21 ?1357  ?INR 1.3* 1.2  ? ? ?Cardiac Enzymes: ?Recent Labs  ?  Lab 08/20/2021 ?0859 08/19/21 ?0806  ?CKTOTAL 49 45  ? ? ?HbA1C: ?Hgb A1c MFr Bld  ?Date/Time Value Ref Range Status  ?08/30/2021 05:02 AM 6.2 (H) 4.8 - 5.6 % Final  ?  Comment:  ?  (NOTE) ?        Prediabetes: 5.7 - 6.4 ?        Diabetes: >6.4 ?        Glycemic control for adults with diabetes: <7.0 ?  ?11/29/2014 10:47 AM 6.0 4.6 - 6.5 % Final  ?  Comment:  ?  Glycemic Control Guidelines for People with Diabetes:Non Diabetic:  <6%Goal of Therapy: <7%Additional Action Suggested:  >8%   ? ? ?CBG: ?Recent Labs  ?Lab 08/23/21 ?1641 08/23/21 ?2005 08/23/21 ?2359 08/24/21 ?0016 08/24/21 ?0809  ?GLUCAP 106* 93 137* 192* 194*  ? ? ?Critical care time: 35 minutes ?  ? ? ?Roselie Awkward, MD ?[facility] ?Pager: [phone] ?Cell: [phone] ?After 7:00 pm call Elink  [phone] ? ?

## 2021-08-24 NOTE — Progress Notes (Addendum)
eLink Physician-Brief Progress Note ?Patient Name: Carrie [name]  ?DOB: 11/04/1960 ?MRN: [id number] ? ? ?Date of Service ? 08/24/2021  ?HPI/Events of Note ? Notified of abnormal breathing. Bedside RN concerned about aspiration as tube feeds have been suctioned from her mouth throughout the shift.   ?On camera evaluation, pt pulling TV of 500-700, despite TV set at 380. She is not tachypneic, but is at times dyssynchronous with the vent.   ?eICU Interventions ? Will check CXR, ABG to further evaluate. ?  ? ?9:51PM ?Reviewed ABG. Pt with combined metabolic and respiratory acidosis.  ?RN reports pt with developing anasarca, CXR pending ?Discontinued normal saline infusion. Replaced with bicarb drip. ?Will continue to monitor serial labs.   ? ? 10:31PM ?PT now with hypoglycemia, CBG 40 ?Will give D50 as per hypoglycemia protocol.  ?Modified bicarb infusion to D5W+13550meq NaHCO3.  ?  ?11:37PM ?Noted to have pink frothy secretions per ETT.  ?Fluids have been modified ealier, and new bicarb drip is yet to be hung.  ?One time dose of lasix 20mg  IV ordered.  ?Pt remains on low dose leovphed. Will monitor response.  ?Increased PEEP from 5 to 8.  ?Thurnell GarbeNTON M DELA CRUZ ?08/24/2021, 9:09 PM ?

## 2021-08-24 NOTE — Procedures (Addendum)
Patient Name: Carrie [name]  ?MRN: [id number]  ?Epilepsy Attending: Charlsie Quest  ?Referring Physician/Provider: Rejeana Brock, MD ?Duration: 08/23/2021 1713 to 08/24/2021 1713 ? ?Patient history: 61yo F with ams. EEG to evaluate for seizure ? ?Level of alertness:  lethargic  ? ?AEDs during EEG study: None ? ?Technical aspects: This EEG study was done with scalp electrodes positioned according to the 10-20 International system of electrode placement. Electrical activity was acquired at a sampling rate of 500Hz  and reviewed with a high frequency filter of 70Hz  and a low frequency filter of 1Hz . EEG data were recorded continuously and digitally stored.  ? ?Description: EEG showed continuous generalized low amplitude predominantly 2-3Hz  delta slowing admixed with intermittent 5- 7 Hz theta slowing. Hyperventilation and photic stimulation were not performed.    ?  ?ABNORMALITY ?- Continuous slow, generalized ?  ?IMPRESSION: ?This study is suggestive of severe diffuse encephalopathy, nonspecific etiology. No seizures or epileptiform discharges were seen throughout the recording. ?  ?Charlsie Quest  ? ?

## 2021-08-24 NOTE — Progress Notes (Signed)
?  [facility] for [facility]ious Disease   ? ?Date of Admission:  08/09/2021   Total days of antibiotics 2 acyclovir ? ? ?ID: KRISI AZUA is a 61 y.o. female with  hx of RA, dermamyositis admitted for encephalopathy and new onset pancytopenia ?Principal Problem: ?  Lactic acidosis ?Active Problems: ?  Hyperlipidemia ?  Hypertension ?  Generalized weakness ?  Rheumatoid arthritis flare (HCC) ?  Depression with anxiety ?  Pancytopenia ([place]) ?  Volume depletion ?  Long-term corticosteroid use ?  Open wound of right hand ?  Adrenal nodule ([place]) ?  Pressure injury of skin ?  High anion gap metabolic acidosis ? ? ? ?Subjective: ?Anemia had improved. Thrombocytopenia as well but got platelet transfusion ?3/18 LP no pleocytosis abnormal glucose; slightly elevated protein; cx ngtd ?Reviewed 3/13 mri brain again -> no change since 2022 ? ?Neurology have high suspicion of autoimmune encephalitis so started pulse steroid after transferring patient to [place] on 3/18 ?She remains rather obtunded ? ?Late 3/18 hypotensive so levophed started (currently trace of pressor only) ? ?Remains afebrile ? ?Multiple id serology negativ/epending ?Blood culture negative ? ?Medications:  ? ARIPiprazole  5 mg Per Tube QHS  ? chlorhexidine gluconate (MEDLINE KIT)  15 mL Mouth Rinse BID  ? Chlorhexidine Gluconate Cloth  6 each Topical Q0600  ? docusate  100 mg Per Tube BID  ? [START ON 08/25/2021] escitalopram  20 mg Per Tube Daily  ? fentaNYL (SUBLIMAZE) injection  50 mcg Intravenous Once  ? folic acid  1 mg Per Tube Daily  ? insulin aspart  0-15 Units Subcutaneous Q4H  ? mouth rinse  15 mL Mouth Rinse 10 times per day  ? multivitamin with minerals  1 tablet Per Tube Daily  ? nutrition supplement (JUVEN)  1 packet Per Tube BID BM  ? pantoprazole sodium  40 mg Per Tube Daily  ? polyethylene glycol  17 g Per Tube Daily  ? potassium chloride  40 mEq Per Tube Q4H  ? senna-docusate  1 tablet Per Tube BID  ? sodium chloride flush  10-40 mL  Intracatheter Q12H  ? thiamine  100 mg Per Tube Daily  ? ? ?Objective: ?Vital signs in last 24 hours: ?Temp:  [97.5 ?F (36.4 ?C)-100.6 ?F (38.1 ?C)] 98.4 ?F (36.9 ?C) (03/19 1100) ?Pulse Rate:  [89-113] 111 (03/19 1100) ?Resp:  [11-29] 15 (03/19 1100) ?BP: (74-170)/(61-103) 104/77 (03/19 1100) ?SpO2:  [87 %-100 %] 100 % (03/19 1127) ?FiO2 (%):  [50 %-80 %] 55 % (03/19 1127) ? ?Physical Exam  ?General/constitutional: intubated; ill appearing ?HEENT: Normocephalic, PER ?Neck supple ?CV: rrr no mrg ?Lungs: clear on vent ?Abd: Soft ?Ext: no edema ?Skin: No Rash ?Neuro: obtunded ?MSK: no peripheral joint swelling/tenderness/warmth ? ? ? ?Central line presence: RUE picc site no erythema/purulence ? ? ? ? ?Lab Results ?Recent Labs  ?  08/23/21 ?1443 08/24/21 ?2800  ?WBC 8.1 11.4*  ?HGB 9.0* 6.9*  ?HCT 25.6* 20.3*  ?NA 125* 125*  ?K 3.6 3.0*  ?CL 101 104  ?CO2 18* 14*  ?BUN 22 19  ?CREATININE 1.13* 1.16*  ? ?Liver Panel ?Recent Labs  ?  08/22/21 ?2135 08/23/21 ?0242 08/24/21 ?0742  ?PROT 3.6* 3.6*  --   ?ALBUMIN 1.8* 1.8* <1.5*  ?AST 132* 119*  --   ?ALT 62* 58*  --   ?ALKPHOS 208* 226*  --   ?BILITOT 1.2 1.5*  --   ? ?Sedimentation Rate ?No results for input(s): ESRSEDRATE in the  last 72 hours. ?C-Reactive Protein ?No results for input(s): CRP in the last 72 hours. ? ?Microbiology: ?reviewed ?Studies/Results: ?CT BIOPSY ? ?Result Date: 08/22/2021 ?INDICATION: Pancytopenia of uncertain etiology. Please perform CT-guided bone marrow biopsy for tissue diagnostic purposes. EXAM: CT-GUIDED BONE MARROW BIOPSY AND ASPIRATION MEDICATIONS: None ANESTHESIA/SEDATION: Moderate (conscious) sedation was employed during this procedure as administered by the Interventional Radiology RN. A total of Versed 2 mg and Fentanyl 100 mcg was administered intravenously. Moderate Sedation Time: 10 minutes. The patient's level of consciousness and vital signs were monitored continuously by radiology nursing throughout the procedure under my direct  supervision. COMPLICATIONS: None immediate. PROCEDURE: Informed consent was obtained from the patient's family following an explanation of the procedure, risks, benefits and alternatives. The patient's family understands, agrees and consents for the procedure. All questions were addressed. A time out was performed prior to the initiation of the procedure. The patient was positioned prone and non-contrast localization CT was performed of the pelvis to demonstrate the iliac marrow spaces. The operative site was prepped and draped in the usual sterile fashion. Under sterile conditions and local anesthesia, a 22 gauge spinal needle was utilized for procedural planning. Next, an 11 gauge coaxial bone biopsy needle was advanced into the left iliac marrow space. Needle position was confirmed with CT imaging. Initially, a bone marrow aspiration was performed. Next, a bone marrow biopsy was obtained with the 11 gauge outer bone marrow device. Samples were prepared with the cytotechnologist and deemed adequate. The needle was removed and superficial hemostasis was obtained with manual compression. A dressing was applied. The patient tolerated the procedure well without immediate post procedural complication. IMPRESSION: Successful CT guided left iliac bone marrow aspiration and core biopsy. Electronically Signed   By: Sandi Mariscal M.D.   On: 08/22/2021 12:45  ? ?DG Chest Port 1 View ? ?Result Date: 08/23/2021 ?CLINICAL DATA:  Intubation.  Feeding tube placement. EXAM: PORTABLE CHEST 1 VIEW COMPARISON:  August 22, 2021 FINDINGS: The ETT is in good position. A new right PICC line terminates in the central SVC. An NG tube terminates in the region of the distal stomach or proximal duodenum. No pneumothorax. No nodules or masses. No focal infiltrates. No overt edema. Stable cardiomediastinal silhouette. IMPRESSION: 1. The ETT is in good position. A new right PICC line is in good position. The NG tube terminates in the region of the  distal stomach or proximal duodenum. The patient may benefit from withdrawing the NG tube approximately 6 cm. 2. Electronically Signed   By: Dorise Bullion III M.D.   On: 08/23/2021 16:53  ? ?DG CHEST PORT 1 VIEW ? ?Result Date: 08/22/2021 ?CLINICAL DATA:  Seizure EXAM: PORTABLE CHEST 1 VIEW.  Patient is rotated. COMPARISON:  Chest x-ray 09/02/2021 FINDINGS: Endotracheal tube terminates 3.5 cm above the carina. Enteric tube courses below the hemidiaphragm with tip and side port overlying the expected region of the gastric lumen. The enteric tube is looped once within the stomach. The heart and mediastinal contours are unchanged given patient rotation. Low lung volumes. Left apical hazy patchy airspace opacity. No pulmonary edema. No pleural effusion. No pneumothorax. No acute osseous abnormality. IMPRESSION: 1. Enteric tube looped once within the stomach. Consider retraction by 5 cm. 2. Endotracheal tube in good position. 3. Left apical hazy patchy airspace opacity. Electronically Signed   By: Iven Finn M.D.   On: 08/22/2021 19:21  ? ?Overnight EEG with video ? ?Result Date: 08/24/2021 ?[name], MD     08/24/2021  6:49 AM Patient Name: RABECKA BRENDEL MRN: [id number] Epilepsy Attending: [name] Referring Physician/Provider: Greta Doom, MD Duration: 08/23/2021 1713 to 08/24/2021 0645 Patient history: 61yo F with ams. EEG to evaluate for seizure Level of alertness:  lethargic AEDs during EEG study: None Technical aspects: This EEG study was done with scalp electrodes positioned according to the 10-20 International system of electrode placement. Electrical activity was acquired at a sampling rate of 500Hz  and reviewed with a high frequency filter of 70Hz  and a low frequency filter of 1Hz . EEG data were recorded continuously and digitally stored. Description: EEG showed continuous generalized low amplitude predominantly 2-3Hz  delta slowing admixed with intermittent 5- 7 Hz theta slowing.  Hyperventilation and photic stimulation were not performed.    ABNORMALITY - Continuous slow, generalized  IMPRESSION: This study is suggestive of severe diffuse encephalopathy, nonspecific etiology. No seizur

## 2021-08-24 NOTE — Progress Notes (Signed)
Subjective: ?No further seizures ? ?Exam: ?Vitals:  ? 08/24/21 0900 08/24/21 0947  ?BP: 111/78   ?Pulse: (!) 113   ?Resp: 20   ?Temp: 98.1 ?F (36.7 ?C)   ?SpO2: 95% (!) 89%  ? ?Gen: In bed, NAD ?Resp: non-labored breathing, no acute distress ?Abd: soft, nt ? ?Neuro: ?MS: Eyes open minimally to noxious stimulation, does not follow commands ?CN: Pupils equal round and reactive, eyes are conjugate ?Motor: She withdraws to noxious stimulation in all four extremities, attempt to localize bilaterally. ?Sensory: As above ? ?Pertinent Labs: ?Sodium 126 ?Calcium 7.3 with an albumin of 1.8 ?Creatinine 1.03 ?AST and ALT are both mildly elevated ?Ammonia from 3/12 was 23 ?TSH 2.47 ?Adamts13 negative ?CSF RBC-one ?CSF WBC-zero ?CSF protein 69 ?CSF glucose 86 ?CSF Gram stain with no organisms ?CSF VDRL-pending ?CSF HSV PCR-pending ?CSF VZV PCR-pending ?CSF crypto antigen-pending ?CSF Lyme PCR-pending ?CSF ACE-pending ?CSF autoimmune panel-pending ?Serum autoimmune panel-sending ? ?MRI brain 3/13-Motion degraded but unremarkable ? ?Impression: 61 year old female with a history of rheumatoid arthritis diagnosed in November as well as subsequent diagnosis of dermatomyositis.  She presented on 3/12 with decreased oral intake, abdominal pain and constipation, and AMS.  Due to her mental status continuing to be poor on 3/16 neurology was consulted.  At that time, she had also become pancytopenic with presumed DIC of unclear etiology.  Given her thrombocytopenia, LP was not immediately attempted, and she was started on acyclovir. ? ?Interestingly, family reports confusion since November with worsening anytime she had steroids attempted to be weaned.  Given the worsening mental status, new onset seizures without clear etiology, there was consideration of opportunistic infection versus an autoimmune process.  LP without evidence of infection, and therefore I have begun treatment for possible autoimmune process with pulse dose  steroids. ? ? ?Recommendations: ?1) continue Solu-Medrol today is day 2/5  ?2) continue LTM EEG for another 24 hours, if negative again overnight can discontinue ?3) continue Keppra 500 mg twice daily ?4) will continue to follow.  ? ?Roland Rack, MD ?[facility] ?[phone] ? ?If 7pm- 7am, please page neurology on call as listed in [place]. ? ?

## 2021-08-24 NOTE — Progress Notes (Signed)
PT Cancellation Note ? ?Patient Details ?Name: Carrie CoxLivia S [name] ?MRN: [id number] ?DOB: 07/11/1960 ? ? ?Cancelled Treatment:    Reason Eval/Treat Not Completed: Medical issues which prohibited therapy at this time as she remains intubated and not following commands. Will continue to follow and re-evaluate as medically appropriate.  ? ?Vickki MuffKatie Zhou, PT, DPT  ? ?Acute Rehabilitation Department ?Pager #: [phone] ? ? ?[name] ?08/24/2021, 10:35 AM ?

## 2021-08-25 DIAGNOSIS — R4182 Altered mental status, unspecified: Secondary | ICD-10-CM | POA: Diagnosis not present

## 2021-08-25 DIAGNOSIS — G9340 Encephalopathy, unspecified: Secondary | ICD-10-CM | POA: Diagnosis not present

## 2021-08-25 DIAGNOSIS — G0490 Encephalitis and encephalomyelitis, unspecified: Secondary | ICD-10-CM

## 2021-08-25 DIAGNOSIS — G9341 Metabolic encephalopathy: Secondary | ICD-10-CM

## 2021-08-25 DIAGNOSIS — D61818 Other pancytopenia: Secondary | ICD-10-CM | POA: Diagnosis not present

## 2021-08-25 LAB — COMPREHENSIVE METABOLIC PANEL
ALT: 48 U/L — ABNORMAL HIGH (ref 0–44)
AST: 119 U/L — ABNORMAL HIGH (ref 15–41)
Albumin: 2.2 g/dL — ABNORMAL LOW (ref 3.5–5.0)
Alkaline Phosphatase: 364 U/L — ABNORMAL HIGH (ref 38–126)
Anion gap: 8 (ref 5–15)
BUN: 28 mg/dL — ABNORMAL HIGH (ref 8–23)
CO2: 19 mmol/L — ABNORMAL LOW (ref 22–32)
Calcium: 7.3 mg/dL — ABNORMAL LOW (ref 8.9–10.3)
Chloride: 104 mmol/L (ref 98–111)
Creatinine, Ser: 1.52 mg/dL — ABNORMAL HIGH (ref 0.44–1.00)
GFR, Estimated: 39 mL/min — ABNORMAL LOW (ref >60)
Glucose, Bld: 102 mg/dL — ABNORMAL HIGH (ref 70–99)
Potassium: 4.4 mmol/L (ref 3.5–5.1)
Sodium: 131 mmol/L — ABNORMAL LOW (ref 135–145)
Total Bilirubin: 1.4 mg/dL — ABNORMAL HIGH (ref 0.3–1.2)
Total Protein: 3.8 g/dL — ABNORMAL LOW (ref 6.5–8.1)

## 2021-08-25 LAB — CBC WITH DIFFERENTIAL/PLATELET
Abs Immature Granulocytes: 0.02 10*3/uL (ref 0.00–0.07)
Basophils Absolute: 0 10*3/uL (ref 0.0–0.1)
Basophils Relative: 0 %
Eosinophils Absolute: 0 10*3/uL (ref 0.0–0.5)
Eosinophils Relative: 0 %
HCT: 25.6 % — ABNORMAL LOW (ref 36.0–46.0)
Hemoglobin: 8.9 g/dL — ABNORMAL LOW (ref 12.0–15.0)
Immature Granulocytes: 0 %
Lymphocytes Relative: 25 %
Lymphs Abs: 1.1 10*3/uL (ref 0.7–4.0)
MCH: 29.7 pg (ref 26.0–34.0)
MCHC: 34.8 g/dL (ref 30.0–36.0)
MCV: 85.3 fL (ref 80.0–100.0)
Monocytes Absolute: 0.1 10*3/uL (ref 0.1–1.0)
Monocytes Relative: 2 %
Neutro Abs: 3.3 10*3/uL (ref 1.7–7.7)
Neutrophils Relative %: 73 %
Platelets: 55 10*3/uL — ABNORMAL LOW (ref 150–400)
RBC: 3 MIL/uL — ABNORMAL LOW (ref 3.87–5.11)
RDW: 20.2 % — ABNORMAL HIGH (ref 11.5–15.5)
WBC: 4.5 10*3/uL (ref 4.0–10.5)
nRBC: 3.3 % — ABNORMAL HIGH (ref 0.0–0.2)

## 2021-08-25 LAB — MISC LABCORP TEST (SEND OUT)
Labcorp test code: 138529
Labcorp test code: 161075
Labcorp test code: 138529
Labcorp test code: 163303

## 2021-08-25 LAB — GLUCOSE, CAPILLARY
Glucose-Capillary: 67 mg/dL — ABNORMAL LOW (ref 70–99)
Glucose-Capillary: 103 mg/dL — ABNORMAL HIGH (ref 70–99)
Glucose-Capillary: 85 mg/dL (ref 70–99)
Glucose-Capillary: 100 mg/dL — ABNORMAL HIGH (ref 70–99)
Glucose-Capillary: 131 mg/dL — ABNORMAL HIGH (ref 70–99)
Glucose-Capillary: 120 mg/dL — ABNORMAL HIGH (ref 70–99)
Glucose-Capillary: 128 mg/dL — ABNORMAL HIGH (ref 70–99)
Glucose-Capillary: 154 mg/dL — ABNORMAL HIGH (ref 70–99)

## 2021-08-25 LAB — POCT I-STAT 7, (LYTES, BLD GAS, ICA,H+H)
Acid-base deficit: 7 mmol/L — ABNORMAL HIGH (ref 0.0–2.0)
Acid-base deficit: 6 mmol/L — ABNORMAL HIGH (ref 0.0–2.0)
Bicarbonate: 18.3 mmol/L — ABNORMAL LOW (ref 20.0–28.0)
Bicarbonate: 19.1 mmol/L — ABNORMAL LOW (ref 20.0–28.0)
Calcium, Ion: 1.17 mmol/L (ref 1.15–1.40)
Calcium, Ion: 1.16 mmol/L (ref 1.15–1.40)
HCT: 25 % — ABNORMAL LOW (ref 36.0–46.0)
HCT: 24 % — ABNORMAL LOW (ref 36.0–46.0)
Hemoglobin: 8.5 g/dL — ABNORMAL LOW (ref 12.0–15.0)
Hemoglobin: 8.2 g/dL — ABNORMAL LOW (ref 12.0–15.0)
O2 Saturation: 99 %
O2 Saturation: 93 %
Patient temperature: 37.6
Patient temperature: 99.3
Potassium: 4.4 mmol/L (ref 3.5–5.1)
Potassium: 4.6 mmol/L (ref 3.5–5.1)
Sodium: 133 mmol/L — ABNORMAL LOW (ref 135–145)
Sodium: 133 mmol/L — ABNORMAL LOW (ref 135–145)
TCO2: 19 mmol/L — ABNORMAL LOW (ref 22–32)
TCO2: 20 mmol/L — ABNORMAL LOW (ref 22–32)
pCO2 arterial: 37.8 mmHg (ref 32–48)
pCO2 arterial: 36.5 mmHg (ref 32–48)
pH, Arterial: 7.297 — ABNORMAL LOW (ref 7.35–7.45)
pH, Arterial: 7.327 — ABNORMAL LOW (ref 7.35–7.45)
pO2, Arterial: 168 mmHg — ABNORMAL HIGH (ref 83–108)
pO2, Arterial: 73 mmHg — ABNORMAL LOW (ref 83–108)

## 2021-08-25 LAB — URINALYSIS, ROUTINE W REFLEX MICROSCOPIC
Bilirubin Urine: NEGATIVE
Glucose, UA: NEGATIVE mg/dL
Ketones, ur: NEGATIVE mg/dL
Leukocytes,Ua: NEGATIVE
Nitrite: NEGATIVE
Protein, ur: NEGATIVE mg/dL
Specific Gravity, Urine: 1.011 (ref 1.005–1.030)
pH: 5 (ref 5.0–8.0)

## 2021-08-25 LAB — BPAM RBC
Blood Product Expiration Date: 202303232359
ISSUE DATE / TIME: 202303191149
Unit Type and Rh: 600

## 2021-08-25 LAB — TYPE AND SCREEN
ABO/RH(D): A NEG
Antibody Screen: NEGATIVE
Unit division: 0

## 2021-08-25 LAB — MAGNESIUM: Magnesium: 2.3 mg/dL (ref 1.7–2.4)

## 2021-08-25 LAB — PATHOLOGIST SMEAR REVIEW

## 2021-08-25 LAB — EBV AB TO VIRAL CAPSID AG PNL, IGG+IGM
EBV VCA IgG: 37.2 U/mL — ABNORMAL HIGH (ref 0.0–17.9)
EBV VCA IgM: <36 U/mL (ref 0.0–35.9)

## 2021-08-25 LAB — FIBRINOGEN: Fibrinogen: 183 mg/dL — ABNORMAL LOW (ref 210–475)

## 2021-08-25 LAB — PARVOVIRUS B19 ANTIBODY, IGG AND IGM
Parovirus B19 IgG Abs: 3.9 {index} — ABNORMAL HIGH (ref 0.0–0.8)
Parovirus B19 IgM Abs: 0.2 {index} (ref 0.0–0.8)

## 2021-08-25 LAB — VDRL, CSF: VDRL Quant, CSF: NONREACTIVE

## 2021-08-25 LAB — PHOSPHORUS: Phosphorus: 2.7 mg/dL (ref 2.5–4.6)

## 2021-08-25 LAB — SURGICAL PATHOLOGY

## 2021-08-25 MED ORDER — DEXTROSE 50 % IV SOLN
12.5000 g | Freq: Once | INTRAVENOUS | Status: AC
  Administered 2021-08-25: 12.5 g via INTRAVENOUS
  Filled 2021-08-25: qty 50

## 2021-08-25 MED ORDER — PANTOPRAZOLE SODIUM 40 MG IV SOLR
40.0000 mg | INTRAVENOUS | Status: DC
  Administered 2021-08-25 – 2021-08-26 (×2): 40 mg via INTRAVENOUS
  Filled 2021-08-25 (×2): qty 10

## 2021-08-25 MED ORDER — FUROSEMIDE 10 MG/ML IJ SOLN
40.0000 mg | Freq: Once | INTRAMUSCULAR | Status: AC
Start: 2021-08-25 — End: 2021-08-25
  Administered 2021-08-25: 40 mg via INTRAVENOUS
  Filled 2021-08-25: qty 4

## 2021-08-25 MED ORDER — MUPIROCIN 2 % EX OINT
TOPICAL_OINTMENT | Freq: Two times a day (BID) | CUTANEOUS | Status: DC
  Administered 2021-08-28 – 2021-09-16 (×11): 1 via TOPICAL
  Filled 2021-08-25 (×5): qty 22

## 2021-08-25 MED ORDER — METOCLOPRAMIDE HCL 5 MG/ML IJ SOLN
10.0000 mg | Freq: Four times a day (QID) | INTRAMUSCULAR | Status: AC
  Administered 2021-08-25 – 2021-08-26 (×4): 10 mg via INTRAVENOUS
  Filled 2021-08-25 (×4): qty 2

## 2021-08-25 MED ORDER — SODIUM CHLORIDE 0.9 % IV SOLN
3.0000 g | Freq: Three times a day (TID) | INTRAVENOUS | Status: DC
  Administered 2021-08-25 – 2021-08-28 (×10): 3 g via INTRAVENOUS
  Filled 2021-08-25 (×10): qty 8

## 2021-08-25 NOTE — Progress Notes (Signed)
PT Cancellation Note ? ?Patient Details ?Name: Carrie [name]  ?MRN: [id number] ?DOB: 03/13/1961 ? ? ?Cancelled Treatment:    Reason Eval/Treat Not Completed: Patient not medically ready ?Spoke with RN who reports not ready.  Will sign off as have cancelled several days in a row.  Please re-consult PT when ready.  Thanks ? ? ?Elray McgregorCynthia Jonica Bickhart ?08/25/2021, 10:56 AM ?Sheran Lawlessyndi Darra Rosa, PT ?Acute Rehabilitation Services ?Pager:[phone] ?[phone] ?08/25/2021 ? ?

## 2021-08-25 NOTE — Progress Notes (Signed)
? ?NAME:  Carrie [name], MRN:  [id number], DOB:  [birth year], LOS: 7 ?ADMISSION DATE:  08/25/2021, CONSULTATION DATE:  3/17 ?REFERRING MD:  [place], CHIEF COMPLAINT:  Confusion, dyspnea  ? ?History of Present Illness:  ?Patient is a 61 year old female with PMHx as below iniitally admitted for AMS, abdominal pain, constipation, decreased PO intake with dehydration. There were reports that patient experienced similar symptoms when her prednisone was tapered down. She was noted to be pancytopenic for which oncology was consulted. Patient thought to have folic acid deficiency. ADAMTS13 nl. 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 Bone marrow biopsy obtained - hypoproductive without blast or malignant cells noted.  ?Initial CT Head and MRI Brain negative for acute intracranial abnormalities and patient's encephalopathy was thought to be secondary to steroid withdrawal. However, on 3/16, neurology consulted for progressively worsening encephalopathy. Patient recommended for LP and start empiric abx coverage for meningitis.Patient also started on acyclovir for possible viral encephalitis. Recommended for MR imaging of spine with elictable clonus in RUE and BLE.  ?DIC panel obtained on 3/17 with elevated D-dimer and low fibronogen with several small bruises on arms with worsening pancytopenia - patient was given 1u pRBC and cryo.  Experienced 3-min seizure activity after attempted central access placement - required intubation for airway protection.  ?LP performed on 3/18 - CSF non-infectious. Patient transferred to [facility] for LTM EEG monitoring  ? ?Pertinent  Medical History  ?Rheumatoid arthritis ?Dermatomyositis ?Fatty liver ?Hyperlipidemia ?Hypertension ?Rhabdomyolysis ? ?Significant Hospital Events: ?Including procedures, antibiotic start and stop dates in addition to other pertinent events   ?03/2021 labs: ANA positive, Jo-1 neg, centromere neg,  ds DNA neg, RNP positive, SSA/SSB pos, chromatin Ab neg, CCP neg ?MRI 3/13-no acute findings ?3/17 bone marrow biopsy ?3/12 CT abdomen and pelvis-no acute findings, adrenal nodule, diverticulosis ?3/12 CT head without contrast-no acute findings ?3/16 EEG-moderate diffuse encephalopathy of nonspecific etiology, no seizures or epileptiform discharges noted during the recording ?3/17 intubation ?3/18 LP performed: no leukocytes, protein elevated, transfer to [facility] for LTM EEG; PICC line placed ? ?Micro ?3/18 csf > neg to date ?3/18 csf vzv >  ?3/16 blood culture > neg to date ?3/12 blood > staph auricularis 1/4 bottles ?3/12 sars cov 2/flu > negative ? ?Abx ?3/12 ceftriaxone x1 ?3/16 acyclovir >3/19 ?3/19 Unasyn  ? 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 She remains on vent support.  ?Remains on low dose levophed. On bicarb gtt with D5 for hypoglycemia and acidosis overnight.  ? ?Objective   ?Blood pressure 100/68, pulse 97, temperature 99.7 ?F (37.6 ?C), resp. rate 11, height $RemoveBe'5\' 1"'WCkdrhIxt$  (1.549 m), weight 72.1 kg, SpO2 100 %. ?CVP:  [6 mmHg-25 mmHg] 12 mmHg  ?Vent Mode: PRVC ?FiO2 (%):  [55 %-100 %] 80 % ?Set Rate:  [16 bmp] 16 bmp ?Vt Set:  [380 mL] 380 mL ?PEEP:  [5 cmH20-8 cmH20] 8 cmH20  ? ?Intake/Output Summary (Last 24 hours) at 08/25/2021 0803 ?Last data filed at 08/25/2021 0600 ?Gross per 24 hour  ?Intake 3330.83 ml  ?Output 1845 ml  ?Net 1485.83 ml  ? ?Filed Weights  ? 08/18/21 0500 08/20/21 1511 08/25/21 0500  ?Weight: 60.6 kg 62 kg 72.1 kg  ? ? ?Examination: ?General:  Critically ill appearing middle aged female; on vent support ?HENT: NCAT ETT in place; disconjugate gaze  ?PULM: coarse breath sounds bilaterally; on full vent support  FiO2 70%, PEEP 5 ?CV: RRR, no mgr, distal pulses intact  ?GI: BS diminished, soft, nontender ?MSK: normal bulk and tone, diffuse anasarca  ?Neuro: pupils pinpoint but reactive; disconjugate gaze; does withdraw to  noxious stimuli; not following commands 2/2 sedation ? ?Resolved Hospital Problem list   ? ? ?Assessment & Plan:  ?Acute metabolic encephalopathy > concern for autoimmune encephalitis based on available evidence, no clear sign of infection ?Patient remains encephalopathic this morning. She is continued on high dose steroids  ?- Neurology consulted, appreciate recommendations ?- Continue high dose steroids  ?- Consider PLEX this week if no improvement  ?- PAD protocol, fentanyl infusion per protocol, RASS target 0 to -1 ? ?Circulatory shock likely 2/2 sedation ?Echo with mild diastolic dysfunction but no other abnormalities noted. Blood culture did have staph auricularis; but this is likely contaminant - does not appear to be in septic shock at this time. She is on high dose steroids; does not appear to be in adrenal crisis at this time. Not in DIC.  ?- Continue levophed for goal MAP >65 ? ?Acute respiratory faiure with hypoxemia  ?Aspiration pneumonia  ?CXR with evidence of aspiration pneumonia. Increasing oxygen requirements on vent.  ?- IV Unasyn for 7 days ?- Continue full vent support ?- VAP bundle  ? ?Pancytopenia > unclear etiology ?Previously concern for DIC for which patient received pRBC and cryo. However, current labs not consistent with DIC and patient does not have oozing from venipuncture sites. Suspect her pancytopenia is likely from bone marrow suppression and acute illness.  ?- Monitor for bleeding ?- Transfuse PRBC for Hgb < 7 gm/dL ?- No role for FFP/Cryo right now ?- Transfuse platelets if < 50K and bleeding or if a procedure is needed ? ?Acute renal failure ?Non aniongap acidosis  ?sCr worsening this morning with decreased urine output. Patient given 1 dose of IV lasix $Remove'40mg'AnUUfmi$  this morning as appears to be hypervolemic on exam.  ?- Urinalysis  ?- Monitor urine output ?- Trend renal function and avoid nephrotoxic agents ?- No urgent need for dialysis at this time  ? ?Mixed connective tissue disease>  based on diagnosis of dermatomyositis and elevated RNP antibody; ddx includes malignancy (hematologic?)  ?follow up bone marrow ?consider PLEX ? ?Transaminitis ?- Trend LFTs  ? ?Hyponatremia ?Minimize free water replacement ?- Trend BMP  ? ?Hyperglycemia ?SSI ? ?Best Practice (right click and "Reselect all SmartList Selections" daily)  ? ?Diet/type: tubefeeds ?DVT prophylaxis: SCD ?GI prophylaxis: PPI ?Lines: Central line and yes and it is still needed ?Foley:  Yes, and it is still needed ?Code Status:  full code ?Last date of multidisciplinary goals of care discussion [sister updated] ? ?Labs   ?CBC: ?Recent Labs  ?Lab 08/19/21 ?0806 08/20/21 ?3154 08/21/21 ?0086 08/22/21 ?0249 08/22/21 ?2135 08/23/21 ?0242 08/23/21 ?1443 08/23/21 ?1611 08/24/21 ?0742 08/24/21 ?2127 08/25/21 ?7619 08/25/21 ?0454  ?WBC 2.2*   < > 3.9*   < > 7.1 9.1 8.1  --  11.4*  --   --  4.5  ?NEUTROABS 1.8  --  2.7  --   --   --   --   --   --   --   --  3.3  ?HGB 8.8*   < > 8.2*   < > 7.9* 8.7* 9.0*  --  6.9* 8.5* 8.5* 8.9*  ?HCT 26.0*   < > 23.9*   < > 24.4* 24.9* 25.6*  --  20.3* 25.0* 25.0* 25.6*  ?MCV 88.7   < >  86.6   < > 92.4 89.9 87.4  --  89.0  --   --  85.3  ?PLT 58*   < > 47*   < > 45* 42* 98* 68* 75*  --   --  55*  ? < > = values in this interval not displayed.  ? ? ?Basic Metabolic Panel: ?Recent Labs  ?Lab 08/22/21 ?2135 08/23/21 ?0242 08/23/21 ?1443 08/23/21 ?1611 08/24/21 ?0742 08/24/21 ?1711 08/24/21 ?2127 08/25/21 ?1021 08/25/21 ?0454  ?NA 127* 126* 125*  --  125* 128* 134* 133* 131*  ?K 3.9 3.8 3.6  --  3.0* 3.4* 4.0 4.4 4.4  ?CL 102 99 101  --  104 105  --   --  104  ?CO2 16* 18* 18*  --  14* 15*  --   --  19*  ?GLUCOSE 180* 218* 109*  --  280* 277*  --   --  102*  ?BUN $Rem'13 16 22  'Craw$ --  19 23  --   --  28*  ?CREATININE 0.79 1.03* 1.13*  --  1.16* 1.23*  --   --  1.52*  ?CALCIUM 7.2* 7.3* 6.8*  --  5.5* 6.6*  --   --  7.3*  ?MG 2.2  --   --  1.2* 1.3* 2.2  --   --  2.3  ?PHOS  --   --   --  2.1* 2.5  2.8 3.1  --   --  2.7   ? ?GFR: ?Estimated Creatinine Clearance: 34.8 mL/min (A) (by C-G formula based on SCr of 1.52 mg/dL (H)). ?Recent Labs  ?Lab 08/23/21 ?0242 08/23/21 ?1443 08/24/21 ?1173 08/25/21 ?0454  ?WBC 9.1 8.1 11.4* 4.5  ?

## 2021-08-25 NOTE — Progress Notes (Signed)
HEMATOLOGY-ONCOLOGY PROGRESS NOTE ? ?ASSESSMENT AND PLAN: ?61 yo female  ?  ?Acute encephalopathy, ? Autoimmune encephalitis ?Thrombocytopenia and normocytic anemia, DIC  ?AKI and hyponatremia secondary to dehydration  ?Hypertension ?Rheumatoid arthritis/dermatomyositis ?Depression and anxiety ?Stage III sacral ulcer ?Folate deficiency  ?Transaminitis ?Acute hypoxic respiratory failure ?Aspiration pneumonia ?Seizure, resolved ?  ?Recommendations: ?-WBC and ANC normal.  She has persistent anemia and thrombocytopenia.  Her hemoglobin is stable today and platelets are slowly drifting down.  Status post 3 units PRBCs this admission. She received 2 units of platelets on 08/23/2021 prior to LP.  Recommend PRBC transfusion for hemoglobin less than 7 and transfuse platelets for platelet count less than 20,000 or active bleeding. ?-DIC panel performed on 08/22/2021 showed an elevated D-dimer with low fibrinogen.  She received 1 unit of pooled cryo on 08/22/2021.  Fibrinogen level improved to 183 today.  Will hold on additional cryo transfusion. ?-Work-up this admission showed no evidence for hemolysis or TTP.  She does have folate deficiency and was started on folic acid.  Bone marrow biopsy was performed 08/22/2021 and we will follow-up on these results. ?-The patient has ongoing work-up for her altered mental status per neurology.  They are considering IVIG. ?-The patient has ongoing work-up per ID for infection. ? ?We will follow-up once we have the preliminary bone marrow biopsy results to discuss with the patient's family. ? ?The total time spent in the visit was 30 minutes. Greater than 50%  of this time was spent counseling and coordinating care related to the above assessment and plan. ? ?Mikey Bussing, DNP, AGPCNP-BC, AOCNP ? ?SUBJECTIVE:  ?Events since Friday have been noted.  Now transferred to [facility].  Intubated and in the ICU.  Her sister and niece are at the bedside.  She had some bleeding from her  mouth yesterday but none today.  Sister states that she wakes up a little bit and opens her eyes.  Seems to recognize her. ? ?REVIEW OF SYSTEMS:   ?Review of Systems  ?Reason unable to perform ROS: Sedated, on ventilator.  ? ?PHYSICAL EXAMINATION: ?ECOG PERFORMANCE STATUS: 4 - Bedbound ? ?Vitals:  ? 08/25/21 1230 08/25/21 1245  ?BP: 106/66 108/76  ?Pulse: (!) 105 (!) 107  ?Resp: 16 16  ?Temp: 99.5 ?F (37.5 ?C) 99.5 ?F (37.5 ?C)  ?SpO2: 93% 98%  ? ?Filed Weights  ? 08/18/21 0500 08/20/21 1511 08/25/21 0500  ?Weight: 60.6 kg 62 kg 72.1 kg  ? ? ?Intake/Output from previous day: ?03/19 0701 - 03/20 0700 ?In: 3330.8 [I.V.:2749.3; Blood:315; IV Piggyback:266.5] ?Out: 1880 [Urine:1380; Emesis/NG output:500] ? ?Physical Exam ?Vitals reviewed.  ?Constitutional:   ?   Comments: Sedated, on ventilator  ?HENT:  ?   Head: Normocephalic.  ?Musculoskeletal:  ?   Right lower leg: Edema present.  ?   Left lower leg: Edema present.  ?Skin: ?   General: Skin is warm and dry.  ?Neurological:  ?   Comments: Sedated.  ? ? ?LABORATORY DATA:  ?I have reviewed the data as listed ?CMP Latest Ref Rng & Units 08/25/2021 08/25/2021 08/25/2021  ?Glucose 70 - 99 mg/dL - 102(H) -  ?BUN 8 - 23 mg/dL - 28(H) -  ?Creatinine 0.44 - 1.00 mg/dL - 1.52(H) -  ?Sodium 135 - 145 mmol/L 133(L) 131(L) 133(L)  ?Potassium 3.5 - 5.1 mmol/L 4.6 4.4 4.4  ?Chloride 98 - 111 mmol/L - 104 -  ?CO2 22 - 32 mmol/L - 19(L) -  ?Calcium 8.9 - 10.3 mg/dL - 7.3(L) -  ?  Total Protein 6.5 - 8.1 g/dL - 3.8(L) -  ?Total Bilirubin 0.3 - 1.2 mg/dL - 1.4(H) -  ?Alkaline Phos 38 - 126 U/L - 364(H) -  ?AST 15 - 41 U/L - 119(H) -  ?ALT 0 - 44 U/L - 48(H) -  ? ? ?Lab Results  ?Component Value Date  ? WBC 4.5 08/25/2021  ? HGB 8.2 (L) 08/25/2021  ? HCT 24.0 (L) 08/25/2021  ? MCV 85.3 08/25/2021  ? PLT 55 (L) 08/25/2021  ? NEUTROABS 3.3 08/25/2021  ? ? ?No results found for: CEA1, CEA, [id number], CA125, PSA1 ? ?DG Chest 1 View ? ?Result Date: 08/06/2021 ?CLINICAL DATA:  Dizziness, nausea and  vomiting, bilateral leg pain EXAM: CHEST  1 VIEW COMPARISON:  04/16/2021 FINDINGS: Single frontal view of the chest demonstrates an unremarkable cardiac silhouette. No acute airspace disease, effusion, or pneumothorax. No acute bony abnormalities. IMPRESSION: 1. No acute intrathoracic process. Electronically Signed   By: Randa Ngo M.D.   On: 08/06/2021 19:41  ? ?CT HEAD WO CONTRAST (5MM) ? ?Result Date: 08/16/2021 ?CLINICAL DATA:  61 year old female with history of delirium. Lower abdominal pain and constipation. EXAM: CT HEAD WITHOUT CONTRAST TECHNIQUE: Contiguous axial images were obtained from the base of the skull through the vertex without intravenous contrast. RADIATION DOSE REDUCTION: This exam was performed according to the departmental dose-optimization program which includes automated exposure control, adjustment of the mA and/or kV according to patient size and/or use of iterative reconstruction technique. COMPARISON:  Head CT 08/06/2021. FINDINGS: Brain: Mild cerebral atrophy. Patchy and confluent areas of decreased attenuation are noted throughout the deep and periventricular white matter of the cerebral hemispheres bilaterally, compatible with chronic microvascular ischemic disease. No evidence of acute infarction, hemorrhage, hydrocephalus, extra-axial collection or mass lesion/mass effect. Vascular: No hyperdense vessel or unexpected calcification. Skull: Normal. Negative for fracture or focal lesion. Sinuses/Orbits: No acute finding. Other: None. IMPRESSION: 1. No acute intracranial abnormalities. 2. Mild cerebral atrophy with chronic microvascular ischemic changes in the cerebral white matter, as above. Electronically Signed   By: Vinnie Langton M.D.   On: 08/22/2021 06:31  ? ?CT Head Wo Contrast ? ?Result Date: 08/06/2021 ?CLINICAL DATA:  Dizziness, nausea and vomiting EXAM: CT HEAD WITHOUT CONTRAST TECHNIQUE: Contiguous axial images were obtained from the base of the skull through the vertex  without intravenous contrast. RADIATION DOSE REDUCTION: This exam was performed according to the departmental dose-optimization program which includes automated exposure control, adjustment of the mA and/or kV according to patient size and/or use of iterative reconstruction technique. COMPARISON:  03/26/2021, 04/17/2021 FINDINGS: Brain: No acute infarct or hemorrhage. Lateral ventricles and midline structures are stable. No acute extra-axial fluid collections. No mass effect. Vascular: No hyperdense vessel or unexpected calcification. Skull: Normal. Negative for fracture or focal lesion. Sinuses/Orbits: No acute finding. Other: None. IMPRESSION: 1. No acute intracranial process. Electronically Signed   By: Randa Ngo M.D.   On: 08/06/2021 19:40  ? ?MR BRAIN WO CONTRAST ? ?Result Date: 08/18/2021 ?CLINICAL DATA:  Delirium.  Unable to follow commands EXAM: MRI HEAD WITHOUT CONTRAST TECHNIQUE: Multiplanar, multiecho pulse sequences of the brain and surrounding structures were obtained without intravenous contrast. COMPARISON:  Head CT from yesterday 04/17/2021 brain MRI FINDINGS: Brain: No acute infarction, hemorrhage, hydrocephalus, extra-axial collection or mass lesion. Cerebral volume loss without specific pattern. Vascular: Normal flow voids. Skull and upper cervical spine: Normal marrow signal. Sinuses/Orbits: Bilateral cataract resection. Minor mastoid opacification on the left more than right. Negative nasopharynx. Other: Motion degraded  study requiring fast brain protocol. IMPRESSION: Motion degraded brain MRI without acute finding. No change since November 2022. Electronically Signed   By: Jorje Guild M.D.   On: 08/18/2021 11:46  ? ?CT ABDOMEN PELVIS W CONTRAST ? ?Result Date: 08/20/2021 ?CLINICAL DATA:  60 year old female with history of acute onset of nonlocalized abdominal pain. EXAM: CT ABDOMEN AND PELVIS WITH CONTRAST TECHNIQUE: Multidetector CT imaging of the abdomen and pelvis was performed using  the standard protocol following bolus administration of intravenous contrast. RADIATION DOSE REDUCTION: This exam was performed according to the departmental dose-optimization program which includes automat

## 2021-08-25 NOTE — Progress Notes (Signed)
Hypoglycemic Event ? ?CBG: 47 ? ?Treatment: D50 50 mL (25 gm) ? ?Symptoms: None ? ?Follow-up CBG: Time:2256 CBG Result:120 ? ?Possible Reasons for Event: Other: Tube feed stopped due to possible malplacement ? ?Comments/MD notified:Dela Sondra Come, MD ? ? ? ?Verda Cumins Alamin Mccuiston ? ? ?

## 2021-08-25 NOTE — Progress Notes (Signed)
SLP Cancellation Note ? ?Patient Details ?Name: Carrie [name] ?MRN: [id number] ?DOB: December 11, 1960 ? ? ?Cancelled treatment:       Reason Eval/Treat Not Completed: Medical issues which prohibited therapy (intubated since last seen by SLP). Will continue to follow as able.  ? ? ? ?Osie Bond., M.A. CCC-SLP ?Acute Rehabilitation Services ?Pager [phone] ?Office [phone] ? ?08/25/2021, 10:04 AM ?

## 2021-08-25 NOTE — Progress Notes (Signed)
Pharmacy Antibiotic Note ? ?Carrie CoxLivia S [name] is a 61 y.o. female admitted on 08/12/2021 with aspiration pneumonia.  Pharmacy has been consulted for Unasyn dosing. SCr trended up to 1.52 today. ? ?Plan: ?Unasyn 3g IV q8h ?Monitor clinical progress, c/s, renal function ?F/u de-escalation plan/LOT ? ? ?Height: 5\' 1"  (154.9 cm) ?Weight: 72.1 kg (158 lb 15.2 oz) ?IBW/kg (Calculated) : 47.8 ? ?Temp (24hrs), Avg:98.6 ?F (37 ?C), Min:98.1 ?F (36.7 ?C), Max:99.9 ?F (37.7 ?C) ? ?Recent Labs  ?Lab 08/22/21 ?2135 08/23/21 ?0242 08/23/21 ?1443 08/24/21 ?[id number] 08/24/21 ?1711 08/25/21 ?0454  ?WBC 7.1 9.1 8.1 11.4*  --  4.5  ?CREATININE 0.79 1.03* 1.13* 1.16* 1.23* 1.52*  ?  ?Estimated Creatinine Clearance: 34.8 mL/min (A) (by C-G formula based on SCr of 1.52 mg/dL (H)).   ? ?Allergies  ?Allergen Reactions  ? Zoloft [Sertraline Hcl] Diarrhea and Other (See Comments)  ?  Stomach upset ?  ? ? ?Leia AlfHaley von Dohlen, PharmD, BCPS ?Please check AMION for all [facility] contact numbers ?Clinical Pharmacist ?08/25/2021 9:22 AM ? ? ?

## 2021-08-25 NOTE — Progress Notes (Signed)
?  [facility]   ? ?Date of Admission:  08/29/2021   Total days of antibiotics 2 acyclovir ? ? ?ID: ANJOLINA BYRER is a 61 y.o. female with  hx of RA, dermamyositis, on pred/mmf/plaquenil, admitted for chronic progressive encephalopathy and new onset pancytopenia ? ?Principal Problem: ?  Lactic acidosis ?Active Problems: ?  Hyperlipidemia ?  Hypertension ?  Generalized weakness ?  Rheumatoid arthritis flare (HCC) ?  Depression with anxiety ?  Pancytopenia ([place]) ?  Volume depletion ?  Long-term corticosteroid use ?  Open wound of right hand ?  Adrenal nodule ([place]) ?  Pressure injury of skin ?  High anion gap metabolic acidosis ?  Altered mental status ? ? ? ?Subjective: ?Remains obtunded ? ?Reviewed ID serology orders -- multiple tests cancelled. Reordered csf meningoencpehalitis PCR and talked with micro ? ?Patient remains on low level pressors for hypotension ? ?Spoke with neurology team -- considering ivig ? ?Cxr 3/19 showed opacities new; primary team started on amp-sulb ? ?Medications:  ? ARIPiprazole  5 mg Per Tube QHS  ? chlorhexidine gluconate (MEDLINE KIT)  15 mL Mouth Rinse BID  ? Chlorhexidine Gluconate Cloth  6 each Topical Q0600  ? escitalopram  20 mg Per Tube Daily  ? fentaNYL (SUBLIMAZE) injection  50 mcg Intravenous Once  ? folic acid  1 mg Per Tube Daily  ? insulin aspart  0-15 Units Subcutaneous Q4H  ? mouth rinse  15 mL Mouth Rinse 10 times per day  ? metoCLOPramide (REGLAN) injection  10 mg Intravenous Q6H  ? mupirocin ointment   Topical BID  ? nutrition supplement (JUVEN)  1 packet Per Tube BID BM  ? pantoprazole (PROTONIX) IV  40 mg Intravenous Q24H  ? sodium chloride flush  10-40 mL Intracatheter Q12H  ? thiamine  100 mg Per Tube Daily  ? ? ?Objective: ?Vital signs in last 24 hours: ?Temp:  [98.1 ?F (36.7 ?C)-99.9 ?F (37.7 ?C)] 99.5 ?F (37.5 ?C) (03/20 1050) ?Pulse Rate:  [92-125] 105 (03/20 1050) ?Resp:  [11-21] 15 (03/20 1050) ?BP: (89-137)/(61-95) 93/64 (03/20  1050) ?SpO2:  [92 %-100 %] 97 % (03/20 1050) ?FiO2 (%):  [55 %-100 %] 60 % (03/20 1050) ?Weight:  [72.1 kg] 72.1 kg (03/20 0500) ? ?Physical Exam  ?General/constitutional: ill appearing, intubated ?HEENT: ?bilateral eyes laterally abducted not midline; per; conj edema ?Neck supple ?CV: rrr no mrg ?Lungs: normal effort on vent ?Abd: soft ?Skin: No Rash ?Neuro: obtunded ?MSK: no peripheral joint swelling/tenderness/warmth ? ? ? ?Central line presence: RUE picc site no erythema/purulence ? ? ? ? ?Lab Results ?Recent Labs  ?  08/24/21 ?0742 08/24/21 ?1711 08/24/21 ?2127 08/25/21 ?0454 08/25/21 ?1022  ?WBC 11.4*  --   --  4.5  --   ?HGB 6.9*  --    < > 8.9* 8.2*  ?HCT 20.3*  --    < > 25.6* 24.0*  ?NA 125* 128*   < > 131* 133*  ?K 3.0* 3.4*   < > 4.4 4.6  ?CL 104 105  --  104  --   ?CO2 14* 15*  --  19*  --   ?BUN 19 23  --  28*  --   ?CREATININE 1.16* 1.23*  --  1.52*  --   ? < > = values in this interval not displayed.  ? ?Liver Panel ?Recent Labs  ?  08/23/21 ?0242 08/24/21 ?0742 08/25/21 ?0454  ?PROT 3.6*  --  3.8*  ?ALBUMIN 1.8* <1.5*  2.2*  ?AST 119*  --  119*  ?ALT 58*  --  48*  ?ALKPHOS 226*  --  364*  ?BILITOT 1.5*  --  1.4*  ? ?Sedimentation Rate ?No results for input(s): ESRSEDRATE in the last 72 hours. ?C-Reactive Protein ?No results for input(s): CRP in the last 72 hours. ? ?Microbiology: ?reviewed ?Studies/Results: ?DG CHEST PORT 1 VIEW ? ?Result Date: 08/24/2021 ?CLINICAL DATA:  Abnormal breathing, concern for aspiration EXAM: PORTABLE CHEST 1 VIEW COMPARISON:  08/23/2021 FINDINGS: Endotracheal tube terminates 2.5 cm above the carina. Multifocal patchy opacities, right upper lobe predominant, concerning for multifocal pneumonia, possibly on the basis of aspiration. This is new from the prior. No pleural effusion or pneumothorax. The heart is normal in size. Enteric tube courses into the proximal duodenum. IMPRESSION: Multifocal pneumonia, right upper lobe predominant, new and possibly on the basis of  aspiration. Support apparatus as above. Electronically Signed   By: Julian Hy M.D.   On: 08/24/2021 23:13  ? ?DG Chest Port 1 View ? ?Result Date: 08/23/2021 ?CLINICAL DATA:  Intubation.  Feeding tube placement. EXAM: PORTABLE CHEST 1 VIEW COMPARISON:  August 22, 2021 FINDINGS: The ETT is in good position. A new right PICC line terminates in the central SVC. An NG tube terminates in the region of the distal stomach or proximal duodenum. No pneumothorax. No nodules or masses. No focal infiltrates. No overt edema. Stable cardiomediastinal silhouette. IMPRESSION: 1. The ETT is in good position. A new right PICC line is in good position. The NG tube terminates in the region of the distal stomach or proximal duodenum. The patient may benefit from withdrawing the NG tube approximately 6 cm. 2. Electronically Signed   By: Dorise Bullion III M.D.   On: 08/23/2021 16:53  ? ?DG Abd Portable 1V ? ?Result Date: 08/24/2021 ?CLINICAL DATA:  Nausea, vomiting EXAM: PORTABLE ABDOMEN - 1 VIEW COMPARISON:  None. FINDINGS: Nasogastric tube tip overlies the expected distal body of the stomach. Nonobstructive bowel gas pattern. No gross free intraperitoneal gas. No organomegaly. No abnormal calcifications within the abdomen. IMPRESSION: Nasogastric tube tip within the distal body of the stomach. Nonobstructive bowel gas pattern. Electronically Signed   By: Fidela Salisbury M.D.   On: 08/24/2021 20:04  ? ?Overnight EEG with video ? ?Result Date: 08/24/2021 ?[name], MD     08/25/2021  8:20 AM Patient Name: TAVON CORRIHER MRN: [id number] Epilepsy Attending: [name] Referring Physician/Provider: Greta Doom, MD Duration: 08/23/2021 1713 to 08/24/2021 1713 Patient history: 61yo F with ams. EEG to evaluate for seizure Level of alertness:  lethargic AEDs during EEG study: None Technical aspects: This EEG study was done with scalp electrodes positioned according to the 10-20 International system of electrode  placement. Electrical activity was acquired at a sampling rate of 500Hz  and reviewed with a high frequency filter of 70Hz  and a low frequency filter of 1Hz . EEG data were recorded continuously and digitally stored. Description: EEG showed continuous generalized low amplitude predominantly 2-3Hz  delta slowing admixed with intermittent 5- 7 Hz theta slowing. Hyperventilation and photic stimulation were not performed.    ABNORMALITY - Continuous slow, generalized  IMPRESSION: This study is suggestive of severe diffuse encephalopathy, nonspecific etiology. No seizures or epileptiform discharges were seen throughout the recording.  Priyanka Barbra Sarks  ? ?ECHOCARDIOGRAM COMPLETE ? ?Result Date: 08/24/2021 ?   ECHOCARDIOGRAM REPORT   Patient Name:   DIAMON REDDINGER Date of Exam: 08/24/2021 Medical Rec #:  [id number]  Height:       61.0 in Accession #:    [id number]     Weight:       136.7 lb Date of Birth:  [birth year]       BSA:          1.607 m? Patient Age:    82 years       BP:           137/92 mmHg Patient Gender: F              HR:           107 bpm. Exam Location:  Inpatient Procedure: 2D Echo, Cardiac Doppler and Color Doppler Indications:    Acute respiratory distress  History:        Patient has prior history of Echocardiogram examinations, most                 recent 03/27/2021. Risk Factors:Hypertension and Dyslipidemia.  Sonographer:    Clayton Lefort RDCS (AE) Referring Phys: [place]  Sonographer Comments: Echo performed with patient supine and on artificial respirator. Image acquisition challenging due to respiratory motion. IMPRESSIONS  1. Left ventricular ejection fraction, by estimation, is 60 to 65%. The left ventricle has normal function. The left ventricle has no regional wall motion abnormalities. There is mild concentric left ventricular hypertrophy. Left ventricular diastolic parameters are consistent with Grade I diastolic dysfunction (impaired relaxation).  2. Right ventricular systolic  function is normal. The right ventricular size is normal.  3. The mitral valve is normal in structure. No evidence of mitral valve regurgitation. No evidence of mitral stenosis.  4. Tricuspid valve regurgitation is mild to

## 2021-08-25 NOTE — Progress Notes (Signed)
Hypoglycemic Event ? ?CBG: 67 ? ? ?Treatment: D50 25 mL (12.5 gm) ? ?Symptoms: None ? ?Follow-up CBG: Time:0319 CBG Result:103 ? ?Possible Reasons for Event: Other: tube feed stopped ? ?Comments/MD notified:Dela Sondra Come, MD ? ? ? ?Verda Cumins Angelica Wix ? ? ?

## 2021-08-25 NOTE — Procedures (Addendum)
Patient Name: Carrie [name]  ?MRN: [id number]  ?Epilepsy Attending: [name]  ?Referring Physician/Provider: Greta Doom, MD ?Duration: 08/24/2021 1713 to 08/25/2021 1713 ?  ?Patient history: 61yo F with ams. EEG to evaluate for seizure ?  ?Level of alertness:  lethargic  ?  ?AEDs during EEG study: LEV ?  ?Technical aspects: This EEG study was done with scalp electrodes positioned according to the 10-20 International system of electrode placement. Electrical activity was acquired at a sampling rate of 500Hz  and reviewed with a high frequency filter of 70Hz  and a low frequency filter of 1Hz . EEG data were recorded continuously and digitally stored.  ?  ?Description: EEG showed continuous generalized low amplitude predominantly 2-3Hz  delta slowing admixed with intermittent 5- 7 Hz theta slowing. Hyperventilation and photic stimulation were not performed.    ?  ?ABNORMALITY ?- Continuous slow, generalized ?  ?IMPRESSION: ?This study is suggestive of severe diffuse encephalopathy, nonspecific etiology. No seizures or epileptiform discharges were seen throughout the recording. ?  ?[name]  ?  ?

## 2021-08-25 NOTE — Progress Notes (Signed)
Subjective: ?Patient is on ventilator and Fentanyl gtt at . She is also on Levophed. When sedation is weaned she becomes agitated with tachycardia and tachypnea. cEEG still in place. No seizures or epileptiform discharges were seen throughout the recording. No family at bedside. RN at bedside no new neurological events overnight. ? ?Exam: ?Vitals:  ? 08/25/21 0900 08/25/21 1050  ?BP: 115/80 93/64  ?Pulse: (!) 109 (!) 105  ?Resp: 17 15  ?Temp: 99.5 ?F (37.5 ?C) 99.5 ?F (37.5 ?C)  ?SpO2: 99% 97%  ? ?Gen: critically ill female, intubated on Fentanyl gtt. Becomes agitated with stimulation ?Resp: non-labored breathing, no acute distress ?Abd: soft, nt ?Skin: deep open not intact lesion on right MP joint. Multiple bruising all over body. Generalized edema  ? ?Neuro: ?MS: sedated. Eyes open to noxious stimulation, does not follow commands ?CN: Pupils equal round and reactive, eyes are disconjugate. Bilateral corneals intact. No blink to threat on Right  ?Motor: withdraws to noxious stimulation in all four extremities, attempt to localize on right upper ?Sensory: responds to noxious stimuli ? ?Pertinent Labs: ?Sodium 133 ?Calcium 7.3 with an albumin of 2.2 ?Creatinine worsening 1.52 from 1.23 ?AST 119 ?ALT 48 ?Ammonia from 3/12 was 23 ?TSH 2.47 ?Adamts13 negative ?CSF RBC-one ?CSF WBC-zero ?CSF protein 69 ?CSF glucose 86 ?CSF Gram stain with no organisms ?CSF VDRL-pending ?CSF HSV PCR-pending ?CSF VZV PCR-pending ?CSF crypto antigen-negative ?CSF Lyme PCR-pending ?CSF ACE-pending ?CSF autoimmune panel-pending ?Serum autoimmune panel-sending ? ?MRI brain 3/13-Motion degraded but unremarkable ? ?CEEG 3/19-3/20:  ?This study is suggestive of severe diffuse encephalopathy, nonspecific etiology. No seizures or epileptiform discharges were seen throughout the recording. ?  ?Impression: 61 year old female with a history of rheumatoid arthritis diagnosed in November as well as subsequent diagnosis of dermatomyositis.   She presented on 3/12 with decreased oral intake, abdominal pain and constipation, and AMS.  Due to her mental status continuing to be poor on 3/16 neurology was consulted.  At that time, she had also become pancytopenic with presumed DIC of unclear etiology.  Given her thrombocytopenia, LP was not immediately attempted, and she was started on acyclovir. ? ?Interestingly, family reports confusion since November with worsening anytime she had steroids attempted to be weaned.  Given the worsening mental status, new onset seizures without clear etiology, there was consideration of opportunistic infection versus an autoimmune process.  LP without evidence of infection, and therefore I have begun treatment for possible autoimmune process with pulse dose steroids. ? ? ?Recommendations: ?1) continue Solu-Medrol today is day 3/5  ?2) no seizures on cEEG. Will D/c today  ?3) continue Keppra 500 mg twice daily ?4) Can consider starting IVIG  ?5) will continue to follow.  ? ?Gevena Mart DNP [id number]-AG  ? ?

## 2021-08-25 NOTE — Progress Notes (Signed)
Inpatient Diabetes Program Recommendations ? ?AACE/ADA: New Consensus Statement on Inpatient Glycemic Control (2015) ? ?Target Ranges:  Prepandial:   less than 140 mg/dL ?     Peak postprandial:   less than 180 mg/dL (1-2 hours) ?     Critically ill patients:  140 - 180 mg/dL  ? ?Lab Results  ?Component Value Date  ? GLUCAP 100 (H) 08/25/2021  ? HGBA1C 6.2 (H) 08/14/2021  ? ? ?Review of Glycemic Control ? Latest Reference Range & Units 08/24/21 08:09 08/24/21 11:38 08/24/21 15:59 08/24/21 19:26 08/24/21 22:01 08/24/21 22:56 08/25/21 02:10 08/25/21 03:19 08/25/21 05:22  ?Glucose-Capillary 70 - 99 mg/dL 194 (H) 275 (H) 237 (H) 110 (H) 47 (L) 120 (H) 67 (L) 103 (H) 85  ? ?Current orders for Inpatient glycemic control:  ?Novolog 0-15 units Q4 hours ? ?A1c 6.2% on 3/12 ?Solumedrol 1000 mg Q24 hours ? ?Inpatient Diabetes Program Recommendations:   ? ?Elevated renal function and hypoglycemia due to amount of Novolog dose ? ?-   Consider reducing Novolog Correction scale to 0-9 units Q4 hours. ? ?Thanks, ? ?Tama Headings RN, MSN, BC-ADM ?Inpatient Diabetes Coordinator ?Team Pager [phone] (8a-5p) ? ? ? ?

## 2021-08-25 NOTE — Progress Notes (Signed)
Maint. Complete. No break down under Fp2 F4 and F8 ?

## 2021-08-25 NOTE — Consult Note (Addendum)
WOC Nurse Consult Note: ?Reason for Consult: Sacrococcygeal pressure injury and nonintact lesion to right MP joint, first finger.   ?Wound type:Unclear etiology, present on admission.  Coccyx wound is over bony prominence, consistent with with stage 3 pressure injury ?Pressure Injury POA: Yes ?Measurement: right MP joint:  2 cm x 2 cm x 0.3 cm  ?Coccyx:  1 cm x 1.5 cm with undermining present circumferentially, extends 0.2 cm  ?Bruising to right pelvis and extends down right lateral thigh with hematoma at distal end.  Cover with silicone foam.  ?Wound bed: ruddy red ?Drainage (amount, consistency, odor) minimal serosanguinous  no odor ?Periwound:intact ?Dressing procedure/placement/frequency: Cleanse right finger wound with NS and pat dry. Apply mupirocin ointment to open wound. Cover with silicone foam dressing.  Reapply ointment each shift and change silicone foam every three days. ? Cleanse coccyx wound with NS and pat dry. Gently pack wound with iodoform gauze. Cover with silicone foam.  Change packing daily and foam every three days.  ?Date and initial foam dressing ?Will not follow at this time.  Please re-consult if needed.  ?Domenic Moras MSN, RN, FNP-BC CWON ?Wound, Ostomy, Continence Nurse ?Pager [phone]  ? ? ?  ?

## 2021-08-26 DIAGNOSIS — Z7952 Long term (current) use of systemic steroids: Secondary | ICD-10-CM | POA: Diagnosis not present

## 2021-08-26 DIAGNOSIS — G0490 Encephalitis and encephalomyelitis, unspecified: Secondary | ICD-10-CM | POA: Diagnosis not present

## 2021-08-26 DIAGNOSIS — R4182 Altered mental status, unspecified: Secondary | ICD-10-CM | POA: Diagnosis not present

## 2021-08-26 DIAGNOSIS — G9340 Encephalopathy, unspecified: Secondary | ICD-10-CM | POA: Diagnosis not present

## 2021-08-26 DIAGNOSIS — D61818 Other pancytopenia: Secondary | ICD-10-CM | POA: Diagnosis not present

## 2021-08-26 LAB — CSF CULTURE W GRAM STAIN
Culture: NO GROWTH
Gram Stain: NONE SEEN

## 2021-08-26 LAB — CBC
HCT: 22.2 % — ABNORMAL LOW (ref 36.0–46.0)
Hemoglobin: 8 g/dL — ABNORMAL LOW (ref 12.0–15.0)
MCH: 30.4 pg (ref 26.0–34.0)
MCHC: 36 g/dL (ref 30.0–36.0)
MCV: 84.4 fL (ref 80.0–100.0)
Platelets: 44 10*3/uL — ABNORMAL LOW (ref 150–400)
RBC: 2.63 MIL/uL — ABNORMAL LOW (ref 3.87–5.11)
RDW: 20 % — ABNORMAL HIGH (ref 11.5–15.5)
WBC: 8.3 10*3/uL (ref 4.0–10.5)
nRBC: 1.5 % — ABNORMAL HIGH (ref 0.0–0.2)

## 2021-08-26 LAB — GLUCOSE, CAPILLARY
Glucose-Capillary: 133 mg/dL — ABNORMAL HIGH (ref 70–99)
Glucose-Capillary: 112 mg/dL — ABNORMAL HIGH (ref 70–99)
Glucose-Capillary: 164 mg/dL — ABNORMAL HIGH (ref 70–99)
Glucose-Capillary: 163 mg/dL — ABNORMAL HIGH (ref 70–99)
Glucose-Capillary: 139 mg/dL — ABNORMAL HIGH (ref 70–99)
Glucose-Capillary: 143 mg/dL — ABNORMAL HIGH (ref 70–99)

## 2021-08-26 LAB — COMPREHENSIVE METABOLIC PANEL
ALT: 42 U/L (ref 0–44)
AST: 103 U/L — ABNORMAL HIGH (ref 15–41)
Albumin: 1.7 g/dL — ABNORMAL LOW (ref 3.5–5.0)
Alkaline Phosphatase: 239 U/L — ABNORMAL HIGH (ref 38–126)
Anion gap: 7 (ref 5–15)
BUN: 30 mg/dL — ABNORMAL HIGH (ref 8–23)
CO2: 27 mmol/L (ref 22–32)
Calcium: 6.6 mg/dL — ABNORMAL LOW (ref 8.9–10.3)
Chloride: 98 mmol/L (ref 98–111)
Creatinine, Ser: 1.65 mg/dL — ABNORMAL HIGH (ref 0.44–1.00)
GFR, Estimated: 35 mL/min — ABNORMAL LOW (ref >60)
Glucose, Bld: 257 mg/dL — ABNORMAL HIGH (ref 70–99)
Potassium: 3.8 mmol/L (ref 3.5–5.1)
Sodium: 132 mmol/L — ABNORMAL LOW (ref 135–145)
Total Bilirubin: 1.6 mg/dL — ABNORMAL HIGH (ref 0.3–1.2)
Total Protein: 3.4 g/dL — ABNORMAL LOW (ref 6.5–8.1)

## 2021-08-26 LAB — VZV PCR, CSF: VZV PCR, CSF: NEGATIVE

## 2021-08-26 LAB — HSV 1/2 PCR, CSF
HSV-1 DNA: NEGATIVE
HSV-2 DNA: NEGATIVE

## 2021-08-26 LAB — CULTURE, BLOOD (ROUTINE X 2)
Culture: NO GROWTH
Culture: NO GROWTH
Special Requests: ADEQUATE
Special Requests: ADEQUATE

## 2021-08-26 LAB — MISC LABCORP TEST (SEND OUT): LabCorp test name: 505625

## 2021-08-26 LAB — VITAMIN B1: Vitamin B1 (Thiamine): 47.4 nmol/L — ABNORMAL LOW (ref 66.5–200.0)

## 2021-08-26 LAB — C-REACTIVE PROTEIN: CRP: 19.4 mg/dL — ABNORMAL HIGH (ref <1.0)

## 2021-08-26 LAB — SEDIMENTATION RATE: Sed Rate: 6 mm/hr (ref 0–22)

## 2021-08-26 MED ORDER — LEVETIRACETAM 100 MG/ML PO SOLN
500.0000 mg | Freq: Two times a day (BID) | ORAL | Status: DC
  Administered 2021-08-26 – 2021-09-21 (×54): 500 mg
  Filled 2021-08-26 (×54): qty 5

## 2021-08-26 MED ORDER — CALCIUM GLUCONATE-NACL 2-0.675 GM/100ML-% IV SOLN
2.0000 g | Freq: Once | INTRAVENOUS | Status: AC
  Administered 2021-08-26: 2000 mg via INTRAVENOUS
  Filled 2021-08-26: qty 100

## 2021-08-26 MED ORDER — LEVETIRACETAM 500 MG PO TABS: 500.0000 mg | ORAL_TABLET | Freq: Two times a day (BID) | ORAL | Status: DC

## 2021-08-26 MED ORDER — ADULT MULTIVITAMIN W/MINERALS CH
1.0000 | ORAL_TABLET | Freq: Every day | ORAL | Status: DC
  Administered 2021-08-26 – 2021-09-21 (×27): 1
  Filled 2021-08-26 (×27): qty 1

## 2021-08-26 MED ORDER — METHOCARBAMOL 500 MG PO TABS
500.0000 mg | ORAL_TABLET | Freq: Four times a day (QID) | ORAL | Status: DC
  Administered 2021-08-26 – 2021-09-01 (×23): 500 mg
  Filled 2021-08-26 (×22): qty 1

## 2021-08-26 MED ORDER — VITAL AF 1.2 CAL PO LIQD
1000.0000 mL | ORAL | Status: DC
  Administered 2021-08-27 – 2021-08-28 (×2): 1000 mL

## 2021-08-26 MED ORDER — PANTOPRAZOLE 2 MG/ML SUSPENSION
40.0000 mg | Freq: Every day | ORAL | Status: DC
  Administered 2021-08-27 – 2021-09-21 (×26): 40 mg
  Filled 2021-08-26 (×26): qty 20

## 2021-08-26 NOTE — Progress Notes (Signed)
Subjective: ?Patient intubated and on fentanyl drip.  Levophed has been discontinued.  Her eyes open to verbal stimulation and she turns her head towards the examiner.  She blinks to threat bilaterally.  This appears to be an improvement compared to yesterday.  She does not follow commands or spontaneously move her extremities. ? ?Exam: ?Vitals:  ? 08/26/21 1500 08/26/21 1524  ?BP: (!) 143/102   ?Pulse: (!) 112 (!) 118  ?Resp: 15 16  ?Temp: 99 ?F (37.2 ?C) 99 ?F (37.2 ?C)  ?SpO2: 94% 93%  ? ?Gen: critically ill female, intubated on Fentanyl gtt ?Resp: non-labored breathing, no acute distress ?Abd: soft, nt ?Skin: deep open not intact lesion on right MP joint. Multiple bruising all over body. Generalized edema  ? ?Neuro: ?MS: sedated. Eyes open to verbal stimulation, does not follow commands ?CN: Pupils equal round and reactive, eyes are disconjugate. Blinks to threat bilaterally ?Motor: withdraws to noxious stimulation in all four extremities, attempt to localize on right upper ?Sensory: responds to noxious stimuli ? ?Pertinent Labs: ?Ammonia from 3/12 was 23 ?TSH 2.47 ?Adamts13 negative ?CSF RBC-one ?CSF WBC-zero ?CSF protein 69 ?CSF glucose 86 ?CSF Gram stain with no organisms ?CSF VDRL-negative ?CSF HSV PCR-negative ?CSF VZV PCR-negative ?CSF crypto antigen-negative ?CSF Lyme PCR-pending ?CSF ACE-pending ?CSF autoimmune panel-pending ?Serum autoimmune panel-sending ? ?MRI brain 3/13-Motion degraded but unremarkable ? ?CEEG 3/19-3/20:  ?This study is suggestive of severe diffuse encephalopathy, nonspecific etiology. No seizures or epileptiform discharges were seen throughout the recording. ?  ?Impression: 61 year old female with a history of rheumatoid arthritis diagnosed in November as well as subsequent diagnosis of dermatomyositis.  She presented on 3/12 with decreased oral intake, abdominal pain and constipation, and AMS.  Due to her mental status continuing to be poor on 3/16 neurology was consulted.  At that  time, she had also become pancytopenic with presumed DIC of unclear etiology.  Given her thrombocytopenia, LP was not immediately attempted, and she was started on acyclovir. ? ?Interestingly, family reported confusion since November with worsening anytime she had steroids attempted to be weaned.  Given the worsening mental status, new onset seizure without clear etiology, there was consideration of opportunistic infection versus an autoimmune process.  LP without evidence of infection, and pulse dose steroids were started. (Of note, the patient had a 3 minute seizure that occurred after central line placement).  ? 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 Today patient exhibits some improvement in mental status. If this does not continue we will start IVIg.  ? ?Recommendations: ?1) continue Solu-Medrol, today is day 4/5  ?2) continue Keppra 500 mg twice daily for previous seizure ?3) Monitor mental status, if she does not continue to improve, then start IVIg ?4) will continue to follow.  ? ? ?Carlyn Reichert, MD ?PGY-1 ?

## 2021-08-26 NOTE — Progress Notes (Signed)
? ?NAME:  Carrie [name], MRN:  [id number], DOB:  [birth year], LOS: 8 ?ADMISSION DATE:  08/29/2021, CONSULTATION DATE:  3/17 ?REFERRING MD:  [place], CHIEF COMPLAINT:  Confusion, dyspnea  ? ?History of Present Illness:  ?Patient is a 61 year old female with PMHx as below iniitally admitted for AMS, abdominal pain, constipation, decreased PO intake with dehydration. There were reports that patient experienced similar symptoms when her prednisone was tapered down. She was noted to be pancytopenic for which oncology was consulted. Patient thought to have folic acid deficiency. ADAMTS13 nl. 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 Bone marrow biopsy obtained - hypoproductive without blast or malignant cells noted.  ?Initial CT Head and MRI Brain negative for acute intracranial abnormalities and patient's encephalopathy was thought to be secondary to steroid withdrawal. However, on 3/16, neurology consulted for progressively worsening encephalopathy. Patient recommended for LP and start empiric abx coverage for meningitis.Patient also started on acyclovir for possible viral encephalitis. Recommended for MR imaging of spine with elictable clonus in RUE and BLE.  ?DIC panel obtained on 3/17 with elevated D-dimer and low fibronogen with several small bruises on arms with worsening pancytopenia - patient was given 1u pRBC and cryo.  Experienced 3-min seizure activity after attempted central access placement - required intubation for airway protection.  ?LP performed on 3/18 - CSF non-infectious. Patient transferred to [facility] for LTM EEG monitoring  ? ?Pertinent  Medical History  ?Rheumatoid arthritis ?Dermatomyositis ?Fatty liver ?Hyperlipidemia ?Hypertension ?Rhabdomyolysis ? ?Significant Hospital Events: ?Including procedures, antibiotic start and stop dates in addition to other pertinent events   ?03/2021 labs: ANA positive, Jo-1 neg, centromere neg,  ds DNA neg, RNP positive, SSA/SSB neg, RF positive, chromatin Ab neg, CCP neg ?MRI 3/13-no acute findings ?3/17 bone marrow biopsy ?3/12 CT abdomen and pelvis-no acute findings, adrenal nodule, diverticulosis ?3/12 CT head without contrast-no acute findings ?3/16 EEG-moderate diffuse encephalopathy of nonspecific etiology, no seizures or epileptiform discharges noted during the recording ?3/17 intubation ?3/18 LP performed: no leukocytes, protein elevated, transfer to [facility] for LTM EEG; PICC line placed ? ?Micro ?3/18 csf > neg to date ?3/18 csf vzv > neg ?3/18 csf hsv > neg ?3/18 csf cryptococcal ag > neg ?3/16 blood culture > neg to date ?3/12 blood > staph auricularis 1/4 bottles ?3/12 sars cov 2/flu > negative ?3/16 > parvoB19 IgG pos, IgM neg ?3/16 > EBV IgG pos, IgM neg ?3/16 > CMV IgG pos, IgM neg ?3/16 > HHV6 IgG pos ?3/17 > bone marrow biopsy > hypercellular with trilineage hematopoiesis ? ?Abx ?Rocephin 3/12 ?Acyclovir 3/16>3/19  ?Unasyn 3/19 > ? 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 She remains on vent support but tolerating PS at this time. ?Off fentanyl and levophed. Withdraws/opens eyes to physical stimuli.  ? ?Objective   ?Blood pressure 122/78, pulse 99, temperature 97.7 ?F (36.5 ?C), resp. rate 19, height _0  (1.549 m), weight 70.4 kg, SpO2 98 %. ?CVP:  [0 mmHg-17 mmHg] 12 mmHg  ?Vent Mode: PRVC ?FiO2 (%):  [60 %] 60 % ?Set Rate:  [16 bmp] 16 bmp ?Vt Set:  [380 mL] 380 mL ?PEEP:  [8 cmH20] 8 cmH20 ?Plateau Pressure:  [6 cmH20-19 cmH20] 17 cmH20  ? ?Intake/Output Summary (Last 24 hours) at 08/26/2021 0727 ?Last data filed at 08/26/2021 0700 ?Gross per 24 hour  ?Intake 2518.17 ml  ?Output 2025 ml  ?Net 493.17  ml  ? ?Filed Weights  ? 08/20/21 1511 08/25/21 0500 08/26/21 0500  ?Weight: 62 kg 72.1 kg 70.4 kg  ? ? ?Examination: ?General:  Critically ill appearing middle aged female; on vent support ?HENT: NCAT ETT in place;  disconjugate gaze  ?PULM: coarse breath sounds bilaterally; on pressure support FiO2 40%, PEEP 10 ?CV: RRR, no mgr, distal pulses intact  ?GI: BS diminished, soft, nontender ?MSK: normal bulk and tone, diffuse anasarca  ?Neuro: pupils pinpoint but reactive; disconjugate gaze; does withdraw to physical stimuli; not following commands  ?Resolved Hospital Problem list   ? 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 She is opening eyes to physical stimuli.  She is continued on high dose steroids. ?- Neurology consulted, appreciate recommendations ?- Continue high dose steroids  ?- Consider PLEX this week if no improvement  ?- PAD protocol, fentanyl infusion per protocol, RASS target 0 to -1 ? ?Circulatory shock likely 2/2 sedation - improved off sedation ?Echo with mild diastolic dysfunction but no other abnormalities noted. Blood culture did have staph auricularis; but this is likely contaminant - does not appear to be in septic shock at this time. She is on high dose steroids; does not appear to be in adrenal crisis at this time.  ?- Continue levophed for goal MAP >65 ? ?Acute respiratory faiure with hypoxemia  ?Aspiration pneumonia  ?- IV Unasyn for 7 days ?- Continue full vent support ?- VAP bundle  ? ?Pancytopenia > unclear etiology ?Previously concern for DIC for which patient received pRBC and cryo. However, current labs not consistent with DIC and patient does not have oozing from venipuncture sites. Suspect her pancytopenia is likely from bone marrow suppression and acute illness. No active bleeding at this time.  ?- Monitor for bleeding ?- Transfuse PRBC for Hgb < 7 gm/dL ?- No role for FFP/Cryo right now ?- Transfuse platelets if < 50K and bleeding or if a procedure is needed ? ?Acute renal failure ?Non aniongap acidosis  ?sCr slightly  worse this morning. Tolerated diuresis well yesterday. UA without UTI.  ?- Will do another dose of Lasix to keep net neg.  ?- Monitor urine output ?- Trend renal function and avoid nephrotoxic agents ?- No urgent need for dialysis at this time  ? 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 ?Transaminitis - improving ?- Trend LFTs  ? ?Hyponatremia ?Minimize free water replacement ?- Trend BMP  ? ?Hyperglycemia ?SSI ? ?Best Practice (right click and "Reselect all SmartList Selections" daily)  ? ?Diet/type: tubefeeds ?DVT prophylaxis: SCD ?GI prophylaxis: PPI ?Lines: Central line and yes and it is still needed ?Foley:  Yes, and it is still needed ?Code Status:  full code ?Last date of multidisciplinary goals of care discussion [sister updated] ? ?Labs   ?CBC: ?Recent Labs  ?Lab 08/19/21 ?0806 08/20/21 ?4098 08/21/21 ?1191 08/22/21 ?0249 08/23/21 ?0242 08/23/21 ?1443 08/23/21 ?1611 08/24/21 ?0742 08/24/21 ?2127 08/25/21 ?4782 08/25/21 ?0454 08/25/21 ?1022 08/26/21 ?0640  ?WBC 2.2*   < > 3.9*   < > 9.1 8.1  --  11.4*  --   --  4.5  --  8.3  ?NEUTROABS 1.8  --  2.7  --   --   --   --   --   --   --  3.3  --   --   ?HGB 8.8*   < > 8.2*   < > 8.7* 9.0*  --  6.9* 8.5* 8.5* 8.9* 8.2* 8.0*  ?HCT 26.0*   < > 23.9*   < > 24.9* 25.6*  --  20.3* 25.0* 25.0* 25.6* 24.0* 22.2*  ?MCV 88.7   < > 86.6   < > 89.9 87.4  --  89.0  --   --  85.3  --  84.4  ?PLT 58*   < > 47*   < > 42* 98* 68* 75*  --   --  55*  --  44*  ? < > = values in this interval not displayed.  ? ? ?Basic Metabolic Panel: ?Recent Labs  ?Lab 08/22/21 ?2135 08/23/21 ?0242 08/23/21 ?1443 08/23/21 ?1611 08/24/21 ?0742 08/24/21 ?1711 08/24/21 ?2127 08/25/21 ?2542 08/25/21 ?0454 08/25/21 ?1022  ?NA 127* 126* 125*  --  125* 128* 134* 133* 131* 133*  ?K 3.9 3.8 3.6  --  3.0* 3.4* 4.0 4.4 4.4 4.6  ?CL 102 99  101  --  104 105  --   --  104  --   ?CO2 16* 18* 18*  --  14* 15*  --   --  19*  --   ?GLUCOSE 180* 218* 109*  --  280* 277*  --   --  102*  --   ?BUN _0 --  19 23  --   --  28*  --   ?CREATININE 0.79 1.0

## 2021-08-26 NOTE — Progress Notes (Signed)
SLP Cancellation Note ? ?Patient Details ?Name: Carrie CoxLivia S  ?MRN: [id number] ?DOB: 04/23/1961 ? ? ?Cancelled treatment:       Reason Eval/Treat Not Completed: Medical issues which prohibited therapy (remains on vent). Will continue to follow. ? ? ? ?Mahala MenghiniLaura N., M.A. CCC-SLP ?Acute Rehabilitation Services ?Pager [phone] ?Office [phone] ? ?08/26/2021, 8:47 AM ?

## 2021-08-26 NOTE — Progress Notes (Signed)
LTM EEG discontinued - no skin breakdown at unhook.   

## 2021-08-26 NOTE — Progress Notes (Signed)
Nutrition Follow-up ? ?DOCUMENTATION CODES:  ? ?Not applicable ? ?INTERVENTION:  ?-Continue Vital AF 1.2 @ 20 ml/hr ?-Advance by 10 ml every 4 hours to new goal rate of 65 ml/hr. ?-Provides 1872 kcals, 117 gm protein and 1265 ml H2O ? ?-Continue Juven BID per tube, each supplement provides 95 kcal and 2.5 g of protein ? ?-MVI with minerals once daily per tube  ? ? ?NUTRITION DIAGNOSIS:  ? ?Increased nutrient needs related to acute illness, wound healing as evidenced by estimated needs. ? ?Ongoing  ? ?GOAL:  ? ?Patient will meet greater than or equal to 90% of their needs ? ?Progressing; addressed via tube feeding  ? ?MONITOR:  ? ?Vent status, Labs, Weight trends, I & O's, Skin ? ?REASON FOR ASSESSMENT:  ? ?Consult ?Enteral/tube feeding initiation and management ? ?ASSESSMENT:  ? ?61 y.o. female past medical history of HTN, HLD, depression, anxiety, dermatomyositis, rheumatoid arthritis on Plaquenil, mycophenolate, and daily prednisone, and fatty liver. She presented to the ED due to AMS, abdominal pain, and several days of constipation with significantly decreased PO intake. Her niece reported that prednisone was decreased and then subsequently needed to be increased recently. Her niece reported that patient has had similar symptoms in the past when steroids have been tapered. She was admitted for lactic acidosis. ? ?3/17 - intubated  ? ?Discussed pt with RN upon entering room. Per RN, tube feeds were on hold due to malplacement of OG tube. RN reports that tube feedings resumed again this AM at 20 ml/hr and will advance by 10 ml every 4 hours to goal rate.  ? ?Per chart review, pt is off fentanyl and levophed and mental status is slightly improved, opening eyes to physical stimuli.  ? ?Met with pt at bedside. Pt continues to remain on vent support. Able to perform NFPE at this time.  ? ?Patient is currently intubated on ventilator support ?MV: 14.8 L/min ?Temp (24hrs), Avg:98.8 ?F (37.1 ?C), Min:97.5 ?F (36.4 ?C),  Max:99.9 ?F (37.7 ?C) ? ?Admit wt: 63.5 kg ?Current wt: 70.4 kg ? ?Labs reviewed and include: CBG's: 67-154 x 24 hours, Na: 132 (L), Corrected calcium: 8.4  ?  ?Medications reviewed and include:  ? folic acid  1 mg Per Tube Daily  ? insulin aspart  0-15 Units Subcutaneous Q4H  ? nutrition supplement (JUVEN)  1 packet Per Tube BID BM  ? pantoprazole (PROTONIX) IV  40 mg Intravenous Q24H  ?0  thiamine  100 mg Per Tube Daily  ? ?Continuous Infusions: ? feeding supplement (VITAL AF 1.2 CAL) 1,000 mL (08/26/21 0936)  ? norepinephrine (LEVOPHED) Adult infusion Stopped (08/26/21 6073)  ? ? ?NUTRITION - FOCUSED PHYSICAL EXAM: ? ?Flowsheet Row Most Recent Value  ?Orbital Region No depletion  ?Upper Arm Region No depletion  ?Thoracic and Lumbar Region No depletion  ?Buccal Region Unable to assess  ?Temple Region No depletion  ?Clavicle Bone Region No depletion  ?Clavicle and Acromion Bone Region No depletion  ?Scapular Bone Region Unable to assess  ?Dorsal Hand Unable to assess  ?Patellar Region No depletion  ?Anterior Thigh Region Unable to assess  ?Posterior Calf Region Unable to assess  ?Edema (RD Assessment) None  ?Hair Reviewed  ?Eyes Reviewed  ?Mouth Reviewed  ?Skin Reviewed  ?Nails Reviewed  ? ?  ? ? ?Diet Order:   ?Diet Order   ? ?       ?  Diet NPO time specified  Diet effective now       ?  ? ?  ?  ? ?  ? ? ?  EDUCATION NEEDS:  ? ?No education needs have been identified at this time ? ?Skin:  Skin Assessment: Skin Integrity Issues: ?Skin Integrity Issues:: Stage III ?Stage III: R hand and sacrum ? ?Last BM:  3/17 -type 2 ? ?Height:  ? ?Ht Readings from Last 1 Encounters:  ?08/23/21 _0  (1.549 m)  ? ? ?Weight:  ? ?Wt Readings from Last 1 Encounters:  ?08/26/21 70.4 kg  ? ? ?Ideal Body Weight:  47.7 kg ? ?BMI:  Body mass index is 29.33 kg/m?. ? ?Estimated Nutritional Needs:  ? ?Kcal:  1900-2100 kcal ? ?Protein:  95-110 grams ? ?Fluid:  >/= 2 L/day ? ? ? ?Maryruth Hancock, Dietetic Intern ?08/26/2021 2:19 PM ?

## 2021-08-26 NOTE — Procedures (Addendum)
Patient Name: Carrie [name]  ?MRN: [id number]  ?Epilepsy Attending: [name]  ?Referring Physician/Provider: Greta Doom, MD ?Duration: 08/25/2021 1713 to 08/25/2121 1117 ?  ?Patient history: 61yo F with ams. EEG to evaluate for seizure ?  ?Level of alertness:  lethargic  ?  ?AEDs during EEG study: LEV ?  ?Technical aspects: This EEG study was done with scalp electrodes positioned according to the 10-20 International system of electrode placement. Electrical activity was acquired at a sampling rate of 500Hz  and reviewed with a high frequency filter of 70Hz  and a low frequency filter of 1Hz . EEG data were recorded continuously and digitally stored.  ?  ?Description: EEG showed continuous generalized low amplitude predominantly 2-3Hz  delta slowing, at times with triphasic morphology admixed with intermittent 5- 7 Hz theta slowing. Hyperventilation and photic stimulation were not performed.    ?  ?ABNORMALITY ?- Continuous slow, generalized ?  ?IMPRESSION: ?This study is suggestive of severe diffuse encephalopathy, nonspecific etiology. No seizures or epileptiform discharges were seen throughout the recording. ?  ?[name]  ?

## 2021-08-26 NOTE — Progress Notes (Signed)
?  [facility] for Infectious Disease   ? ?Date of Admission:  09/04/2021   Total days of antibiotics 2 acyclovir ? ? ?ID: SHARIKA MOSQUERA is a 61 y.o. female with  hx of RA, dermamyositis, on pred/mmf/plaquenil, admitted for chronic progressive encephalopathy and new onset pancytopenia ? ?Principal Problem: ?  Lactic acidosis ?Active Problems: ?  Hyperlipidemia ?  Hypertension ?  Generalized weakness ?  Rheumatoid arthritis flare (HCC) ?  Depression with anxiety ?  Pancytopenia ([place]) ?  Volume depletion ?  Long-term corticosteroid use ?  Open wound of right hand ?  Adrenal nodule ([place]) ?  Pressure injury of skin ?  High anion gap metabolic acidosis ?  Altered mental status ?  Metabolic encephalopathy ?  Encephalitis ?  Acute encephalopathy ? ? ? ?Subjective: ?Slight improvement in mentation ? ? ? ?Medications:  ? ARIPiprazole  5 mg Per Tube QHS  ? chlorhexidine gluconate (MEDLINE KIT)  15 mL Mouth Rinse BID  ? Chlorhexidine Gluconate Cloth  6 each Topical Q0600  ? escitalopram  20 mg Per Tube Daily  ? fentaNYL (SUBLIMAZE) injection  50 mcg Intravenous Once  ? folic acid  1 mg Per Tube Daily  ? insulin aspart  0-15 Units Subcutaneous Q4H  ? levETIRAcetam  500 mg Per Tube BID  ? mouth rinse  15 mL Mouth Rinse 10 times per day  ? methocarbamol  500 mg Per Tube QID  ? mupirocin ointment   Topical BID  ? nutrition supplement (JUVEN)  1 packet Per Tube BID BM  ? pantoprazole (PROTONIX) IV  40 mg Intravenous Q24H  ? sodium chloride flush  10-40 mL Intracatheter Q12H  ? thiamine  100 mg Per Tube Daily  ? ? ?Objective: ?Vital signs in last 24 hours: ?Temp:  [97.5 ?F (36.4 ?C)-99.9 ?F (37.7 ?C)] 98.6 ?F (37 ?C) (03/21 1100) ?Pulse Rate:  [90-127] 127 (03/21 1100) ?Resp:  [0-25] 19 (03/21 1100) ?BP: (85-134)/(58-106) 115/77 (03/21 1000) ?SpO2:  [90 %-100 %] 91 % (03/21 1100) ?FiO2 (%):  [40 %-60 %] 50 % (03/21 1100) ?Weight:  [70.4 kg] 70.4 kg (03/21 0500) ? ?Physical Exam  ?General/constitutional: ill appearing;  spontaneous eye opening and upper ext movements, trying to remove her ett but hands in mitt ?HEENT: Normocephalic, PER, Conj Clear ?Neck supple ?CV: rrr no mrg ?Lungs: clear on vent minimal support (40% fio2) ?Abd: Soft, Nontender ?Ext: no edema ?Skin: No Rash ?Neuro: withdraws feet to noxious stimuli ?MSK: no peripheral joint swelling/tenderness/warmth ? ? ?Central line presence: RUE picc site no erythema/purulence ? ? ? ? ?Lab Results ?Recent Labs  ?  08/25/21 ?0454 08/25/21 ?1022 08/26/21 ?0640  ?WBC 4.5  --  8.3  ?HGB 8.9* 8.2* 8.0*  ?HCT 25.6* 24.0* 22.2*  ?NA 131* 133* 132*  ?K 4.4 4.6 3.8  ?CL 104  --  98  ?CO2 19*  --  27  ?BUN 28*  --  30*  ?CREATININE 1.52*  --  1.65*  ? ?Liver Panel ?Recent Labs  ?  08/25/21 ?0454 08/26/21 ?0640  ?PROT 3.8* 3.4*  ?ALBUMIN 2.2* 1.7*  ?AST 119* 103*  ?ALT 48* 42  ?ALKPHOS 364* 239*  ?BILITOT 1.4* 1.6*  ? ?Sedimentation Rate ?No results for input(s): ESRSEDRATE in the last 72 hours. ?C-Reactive Protein ?No results for input(s): CRP in the last 72 hours. ? ?Microbiology: ?reviewed ?Studies/Results: ?DG CHEST PORT 1 VIEW ? ?Result Date: 08/24/2021 ?CLINICAL DATA:  Abnormal breathing, concern for aspiration EXAM: PORTABLE CHEST 1 VIEW  COMPARISON:  08/23/2021 FINDINGS: Endotracheal tube terminates 2.5 cm above the carina. Multifocal patchy opacities, right upper lobe predominant, concerning for multifocal pneumonia, possibly on the basis of aspiration. This is new from the prior. No pleural effusion or pneumothorax. The heart is normal in size. Enteric tube courses into the proximal duodenum. IMPRESSION: Multifocal pneumonia, right upper lobe predominant, new and possibly on the basis of aspiration. Support apparatus as above. Electronically Signed   By: Julian Hy M.D.   On: 08/24/2021 23:13  ? ?DG Abd Portable 1V ? ?Result Date: 08/24/2021 ?CLINICAL DATA:  Nausea, vomiting EXAM: PORTABLE ABDOMEN - 1 VIEW COMPARISON:  None. FINDINGS: Nasogastric tube tip overlies the  expected distal body of the stomach. Nonobstructive bowel gas pattern. No gross free intraperitoneal gas. No organomegaly. No abnormal calcifications within the abdomen. IMPRESSION: Nasogastric tube tip within the distal body of the stomach. Nonobstructive bowel gas pattern. Electronically Signed   By: Fidela Salisbury M.D.   On: 08/24/2021 20:04  ? ?ECHOCARDIOGRAM COMPLETE ? ?Result Date: 08/24/2021 ?   ECHOCARDIOGRAM REPORT   Patient Name:   MELIKA REDER Date of Exam: 08/24/2021 Medical Rec #:  [id number]      Height:       61.0 in Accession #:    [id number]     Weight:       136.7 lb Date of Birth:  09-18-1960       BSA:          1.607 m? Patient Age:    65 years       BP:           137/92 mmHg Patient Gender: F              HR:           107 bpm. Exam Location:  Inpatient Procedure: 2D Echo, Cardiac Doppler and Color Doppler Indications:    Acute respiratory distress  History:        Patient has prior history of Echocardiogram examinations, most                 recent 03/27/2021. Risk Factors:Hypertension and Dyslipidemia.  Sonographer:    Clayton Lefort RDCS (AE) Referring Phys: [place]  Sonographer Comments: Echo performed with patient supine and on artificial respirator. Image acquisition challenging due to respiratory motion. IMPRESSIONS  1. Left ventricular ejection fraction, by estimation, is 60 to 65%. The left ventricle has normal function. The left ventricle has no regional wall motion abnormalities. There is mild concentric left ventricular hypertrophy. Left ventricular diastolic parameters are consistent with Grade I diastolic dysfunction (impaired relaxation).  2. Right ventricular systolic function is normal. The right ventricular size is normal.  3. The mitral valve is normal in structure. No evidence of mitral valve regurgitation. No evidence of mitral stenosis.  4. Tricuspid valve regurgitation is mild to moderate.  5. The aortic valve is normal in structure. Aortic valve regurgitation is  not visualized. No aortic stenosis is present. FINDINGS  Left Ventricle: Left ventricular ejection fraction, by estimation, is 60 to 65%. The left ventricle has normal function. The left ventricle has no regional wall motion abnormalities. The left ventricular internal cavity size was normal in size. There is  mild concentric left ventricular hypertrophy. Left ventricular diastolic parameters are consistent with Grade I diastolic dysfunction (impaired relaxation). Normal left ventricular filling pressure. Right Ventricle: The right ventricular size is normal. No increase in right ventricular wall thickness. Right ventricular systolic function is  normal. Left Atrium: Left atrial size was normal in size. Right Atrium: Right atrial size was normal in size. Prominent Eustachian valve. Pericardium: There is no evidence of pericardial effusion. Mitral Valve: The mitral valve is normal in structure. No evidence of mitral valve regurgitation. No evidence of mitral valve stenosis. Tricuspid Valve: The tricuspid valve is normal in structure. Tricuspid valve regurgitation is mild to moderate. No evidence of tricuspid stenosis. Aortic Valve: The aortic valve is normal in structure. Aortic valve regurgitation is not visualized. No aortic stenosis is present. Aortic valve mean gradient measures 6.0 mmHg. Aortic valve peak gradient measures 9.5 mmHg. Aortic valve area, by VTI measures 2.81 cm?. Pulmonic Valve: The pulmonic valve was normal in structure. Pulmonic valve regurgitation is not visualized. No evidence of pulmonic stenosis. Aorta: The aortic root is normal in size and structure. Venous: IVC assessment for right atrial pressure unable to be performed due to mechanical ventilation. IAS/Shunts: The interatrial septum was not well visualized.  LEFT VENTRICLE PLAX 2D LVIDd:         3.70 cm   Diastology LVIDs:         2.50 cm   LV e' medial:    11.90 cm/s LV PW:         1.30 cm   LV E/e' medial:  7.9 LV IVS:        1.10 cm    LV e' lateral:   13.50 cm/s LVOT diam:     2.10 cm   LV E/e' lateral: 7.0 LV SV:         62 LV SV Index:   39 LVOT Area:     3.46 cm?  RIGHT VENTRICLE             IVC RV Basal diam:  2.30 cm     IVC diam: 1.70 cm RV

## 2021-08-26 NOTE — Plan of Care (Signed)
°  Problem: Education: °Goal: Knowledge of General Education information will improve °Description: Including pain rating scale, medication(s)/side effects and non-pharmacologic comfort measures °Outcome: Progressing °  °Problem: Health Behavior/Discharge Planning: °Goal: Ability to manage health-related needs will improve °Outcome: Progressing °  °Problem: Clinical Measurements: °Goal: Ability to maintain clinical measurements within normal limits will improve °Outcome: Progressing °  °Problem: Activity: °Goal: Risk for activity intolerance will decrease °Outcome: Progressing °  °Problem: Nutrition: °Goal: Adequate nutrition will be maintained °Outcome: Progressing °  °Problem: Elimination: °Goal: Will not experience complications related to bowel motility °Outcome: Progressing °  °Problem: Skin Integrity: °Goal: Risk for impaired skin integrity will decrease °Outcome: Progressing °  °

## 2021-08-27 ENCOUNTER — Inpatient Hospital Stay (HOSPITAL_COMMUNITY): Payer: Federal, State, Local not specified - PPO

## 2021-08-27 DIAGNOSIS — M069 Rheumatoid arthritis, unspecified: Secondary | ICD-10-CM | POA: Diagnosis not present

## 2021-08-27 DIAGNOSIS — G0481 Other encephalitis and encephalomyelitis: Secondary | ICD-10-CM

## 2021-08-27 DIAGNOSIS — D61818 Other pancytopenia: Secondary | ICD-10-CM | POA: Diagnosis not present

## 2021-08-27 DIAGNOSIS — G0490 Encephalitis and encephalomyelitis, unspecified: Secondary | ICD-10-CM | POA: Diagnosis not present

## 2021-08-27 DIAGNOSIS — G9340 Encephalopathy, unspecified: Secondary | ICD-10-CM | POA: Diagnosis not present

## 2021-08-27 DIAGNOSIS — R109 Unspecified abdominal pain: Secondary | ICD-10-CM | POA: Diagnosis not present

## 2021-08-27 DIAGNOSIS — R4182 Altered mental status, unspecified: Secondary | ICD-10-CM | POA: Diagnosis not present

## 2021-08-27 LAB — COMPREHENSIVE METABOLIC PANEL
ALT: 49 U/L — ABNORMAL HIGH (ref 0–44)
AST: 120 U/L — ABNORMAL HIGH (ref 15–41)
Albumin: 1.8 g/dL — ABNORMAL LOW (ref 3.5–5.0)
Alkaline Phosphatase: 604 U/L — ABNORMAL HIGH (ref 38–126)
Anion gap: 9 (ref 5–15)
BUN: 37 mg/dL — ABNORMAL HIGH (ref 8–23)
CO2: 23 mmol/L (ref 22–32)
Calcium: 7.7 mg/dL — ABNORMAL LOW (ref 8.9–10.3)
Chloride: 104 mmol/L (ref 98–111)
Creatinine, Ser: 1.64 mg/dL — ABNORMAL HIGH (ref 0.44–1.00)
GFR, Estimated: 35 mL/min — ABNORMAL LOW (ref >60)
Glucose, Bld: 176 mg/dL — ABNORMAL HIGH (ref 70–99)
Potassium: 4.1 mmol/L (ref 3.5–5.1)
Sodium: 136 mmol/L (ref 135–145)
Total Bilirubin: 2.1 mg/dL — ABNORMAL HIGH (ref 0.3–1.2)
Total Protein: 4.1 g/dL — ABNORMAL LOW (ref 6.5–8.1)

## 2021-08-27 LAB — ANCA TITERS
Atypical P-ANCA titer: <1:20 {titer}
C-ANCA: <1:20 {titer}
P-ANCA: <1:20 {titer}

## 2021-08-27 LAB — GLUCOSE, CAPILLARY
Glucose-Capillary: 188 mg/dL — ABNORMAL HIGH (ref 70–99)
Glucose-Capillary: 157 mg/dL — ABNORMAL HIGH (ref 70–99)
Glucose-Capillary: 121 mg/dL — ABNORMAL HIGH (ref 70–99)
Glucose-Capillary: 120 mg/dL — ABNORMAL HIGH (ref 70–99)
Glucose-Capillary: 151 mg/dL — ABNORMAL HIGH (ref 70–99)
Glucose-Capillary: 181 mg/dL — ABNORMAL HIGH (ref 70–99)

## 2021-08-27 LAB — PROTEIN ELECTROPHORESIS, SERUM
A/G Ratio: 1.3 (ref 0.7–1.7)
Albumin ELP: 2.2 g/dL — ABNORMAL LOW (ref 2.9–4.4)
Alpha-1-Globulin: 0.3 g/dL (ref 0.0–0.4)
Alpha-2-Globulin: 0.3 g/dL — ABNORMAL LOW (ref 0.4–1.0)
Beta Globulin: 0.5 g/dL — ABNORMAL LOW (ref 0.7–1.3)
Gamma Globulin: 0.6 g/dL (ref 0.4–1.8)
Globulin, Total: 1.7 g/dL — ABNORMAL LOW (ref 2.2–3.9)
M-Spike, %: 0.2 g/dL — ABNORMAL HIGH
Total Protein ELP: 3.9 g/dL — ABNORMAL LOW (ref 6.0–8.5)

## 2021-08-27 LAB — CBC
HCT: 25.2 % — ABNORMAL LOW (ref 36.0–46.0)
Hemoglobin: 8.7 g/dL — ABNORMAL LOW (ref 12.0–15.0)
MCH: 30.1 pg (ref 26.0–34.0)
MCHC: 34.5 g/dL (ref 30.0–36.0)
MCV: 87.2 fL (ref 80.0–100.0)
Platelets: 48 10*3/uL — ABNORMAL LOW (ref 150–400)
RBC: 2.89 MIL/uL — ABNORMAL LOW (ref 3.87–5.11)
RDW: 20.4 % — ABNORMAL HIGH (ref 11.5–15.5)
WBC: 10.2 10*3/uL (ref 4.0–10.5)
nRBC: 2 % — ABNORMAL HIGH (ref 0.0–0.2)

## 2021-08-27 LAB — C3 COMPLEMENT: C3 Complement: 58 mg/dL — ABNORMAL LOW (ref 82–167)

## 2021-08-27 LAB — MISC LABCORP TEST (SEND OUT)
Labcorp test code: 9985
Source (LabCorp): 0.5

## 2021-08-27 LAB — C4 COMPLEMENT: Complement C4, Body Fluid: 23 mg/dL (ref 12–38)

## 2021-08-27 LAB — ANGIOTENSIN CONVERTING ENZYME, CSF: Angio Convert Enzyme: <1.5 U/L (ref 0.0–3.1)

## 2021-08-27 LAB — LYME DISEASE DNA BY PCR(BORRELIA BURG): Lyme Disease(B.burgdorferi)PCR: NEGATIVE

## 2021-08-27 IMAGING — US US ABDOMEN LIMITED
1 series · 14 of 25 positions shown · non-contrast
Comparison: [DATE] CT

CLINICAL DATA: Elevated liver function tests.

EXAM:
ULTRASOUND ABDOMEN LIMITED RIGHT UPPER QUADRANT

[Series 1: us abdomen limited ruq (liver/gb) · 14 of 30 slices shown]
[im 1/30]
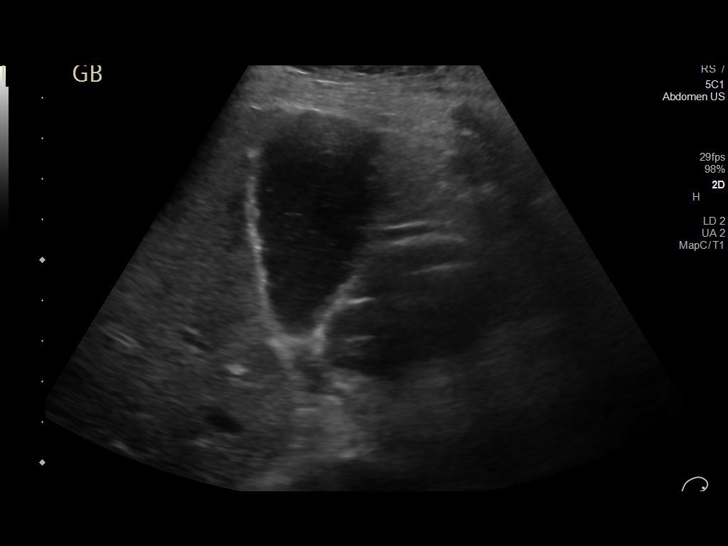
[im 3/30]
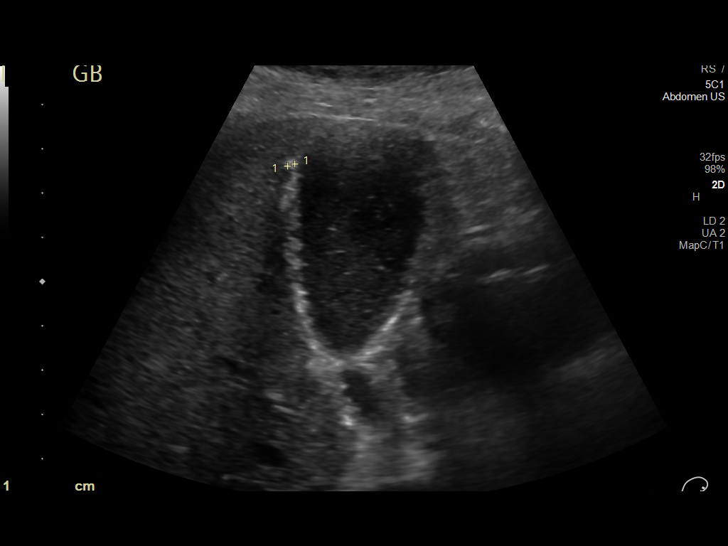
[im 5/30]
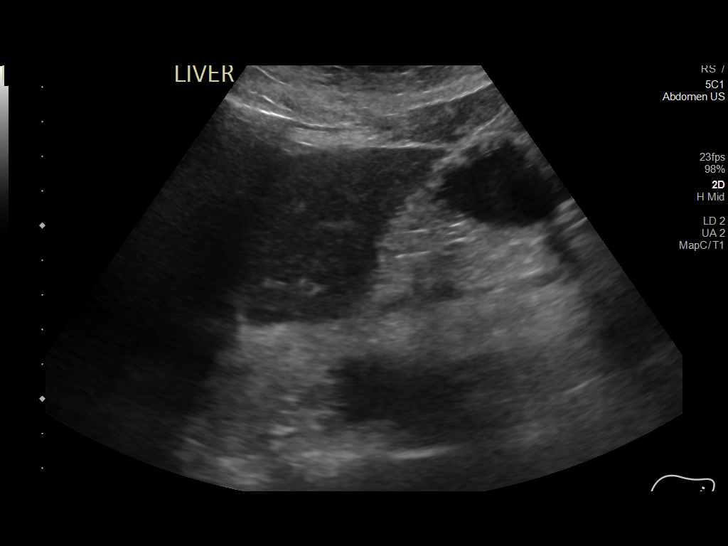
[im 8/30]
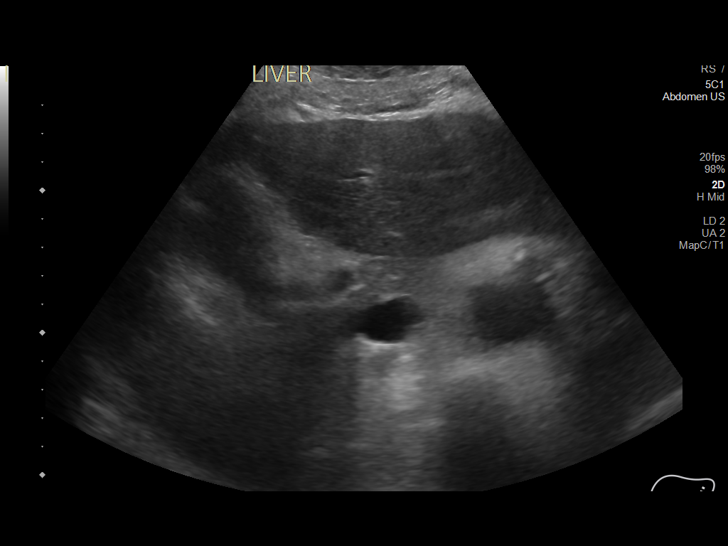
[im 10/30]
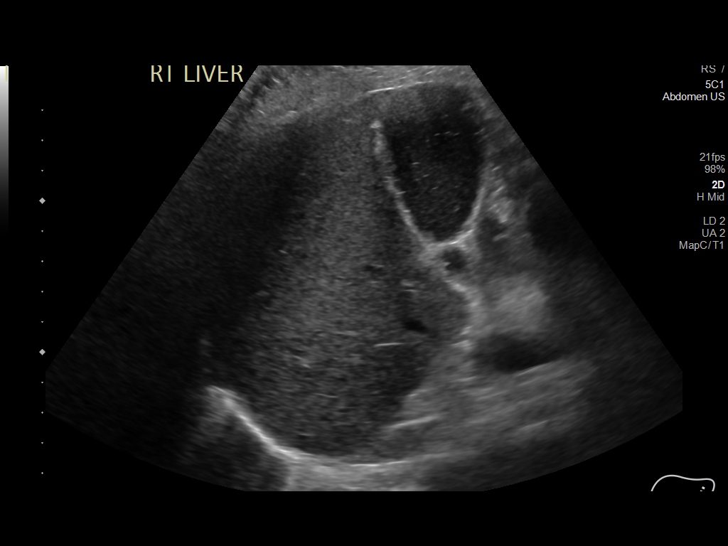
[im 11/30]
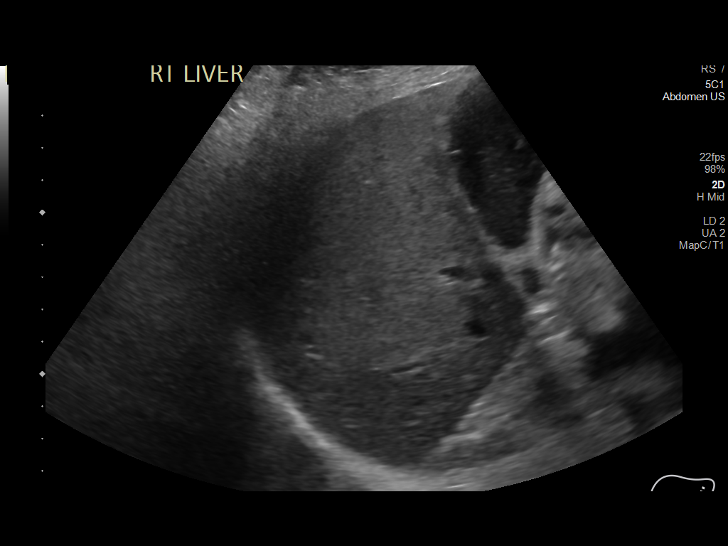
[im 14/30]
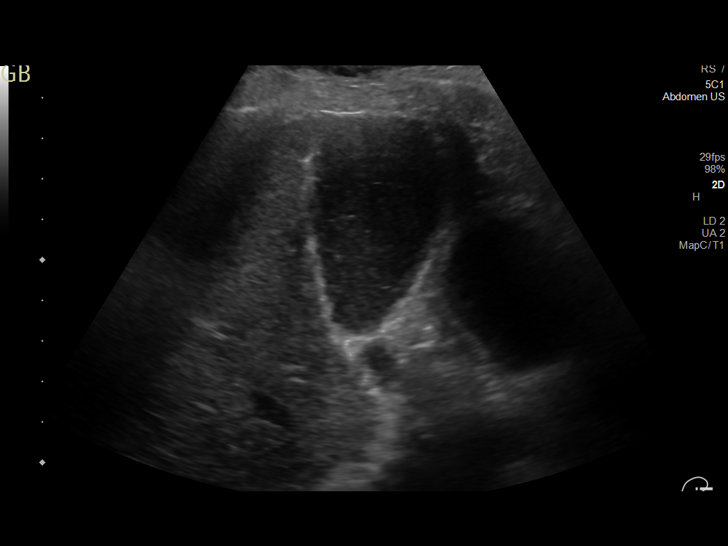
[im 16/30]
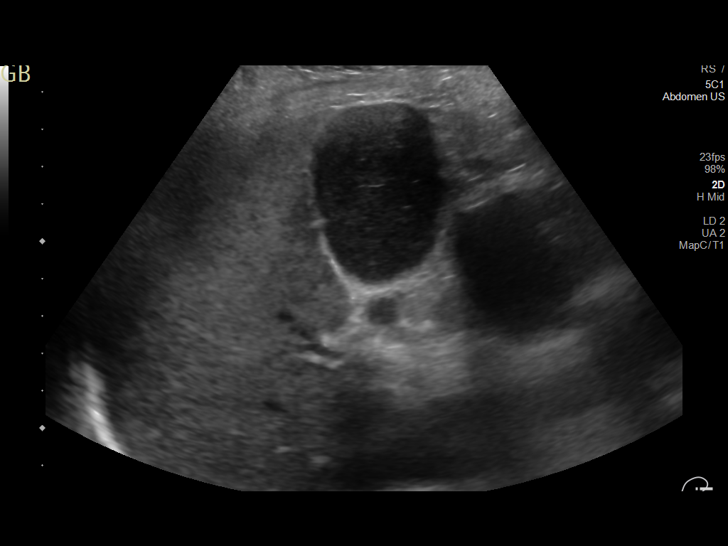
[im 19/30]
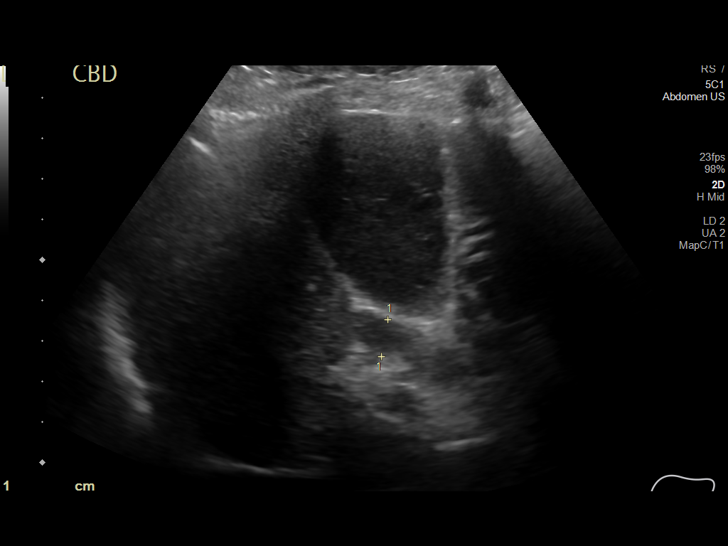
[im 20/30]
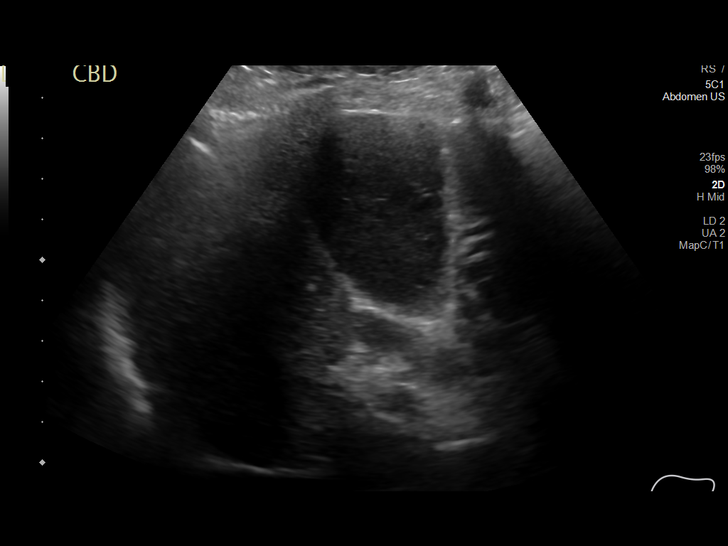
[im 22/30]
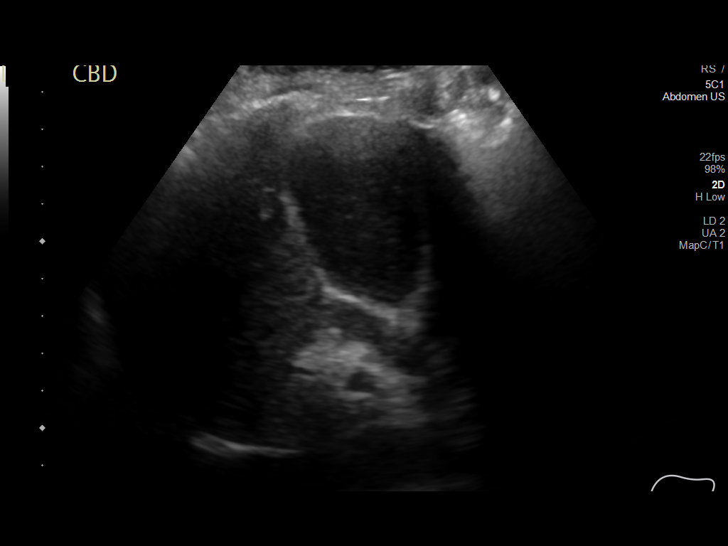
[im 25/30]
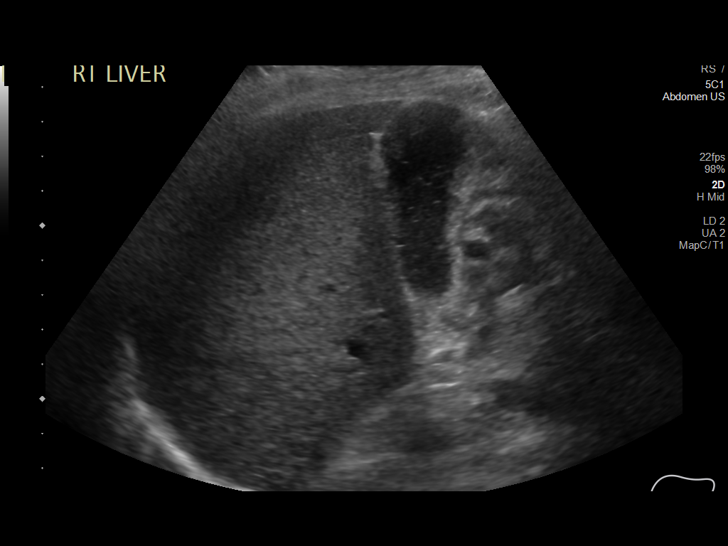
[im 27/30]
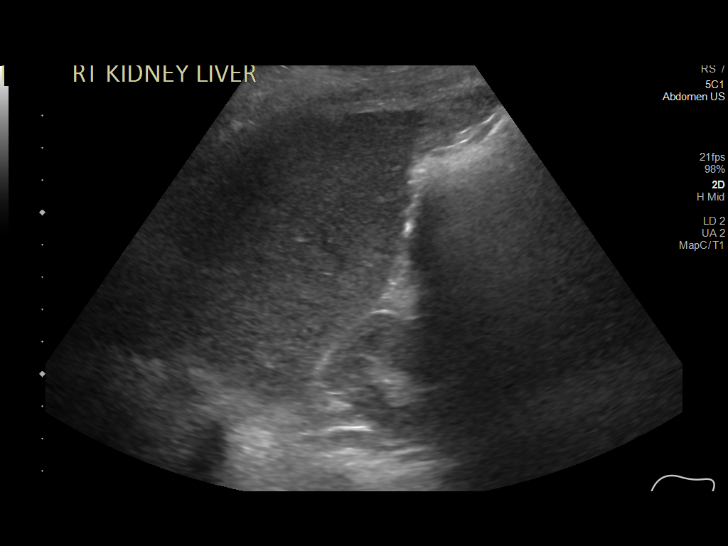
[im 30/30]
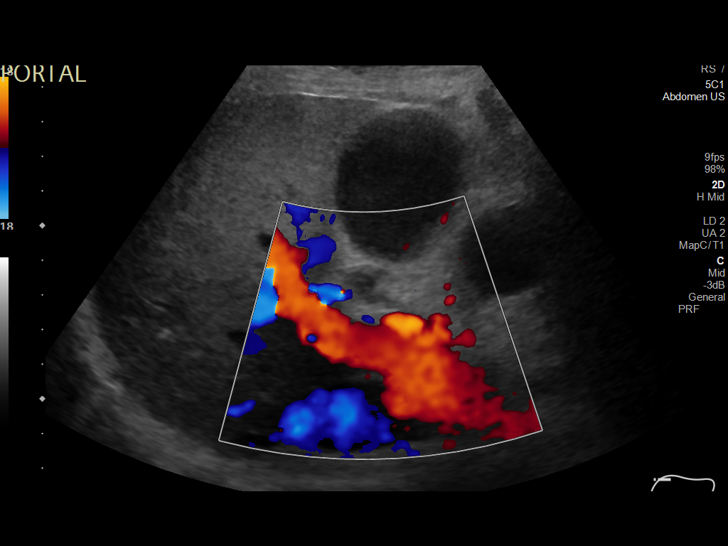

[14 of 25 positions shown; findings below may reference images not displayed]

FINDINGS: Gallbladder:

Sludge filled gallbladder. No wall thickening or pericholecystic
fluid. Sonographic Murphy sign could not be evaluated secondary to
patient's clinical status.

Common bile duct:

Diameter: Mildly dilated for age at 9 mm. 7 mm on the ultrasound of
[DATE].

Liver:

Moderately increased hepatic echogenicity. Portal vein is patent on
color Doppler imaging with normal direction of blood flow towards
the liver.

Other: None.
IMPRESSION: Gallbladder sludge, without acute cholecystitis.

Development of mild biliary duct dilatation compared to prior
ultrasound. Correlate with bilirubin levels. If elevated, consider
MRCP.

## 2021-08-27 MED ORDER — SODIUM CHLORIDE 0.9 % IV SOLN
250.0000 mg | Freq: Two times a day (BID) | INTRAVENOUS | Status: DC
  Administered 2021-08-28 – 2021-08-29 (×3): 250 mg via INTRAVENOUS
  Filled 2021-08-27 (×4): qty 4

## 2021-08-27 MED ORDER — PREDNISONE 20 MG PO TABS
40.0000 mg | ORAL_TABLET | Freq: Every day | ORAL | Status: DC
Start: 2021-09-06 — End: 2021-08-29

## 2021-08-27 MED ORDER — SODIUM CHLORIDE 0.9 % IV SOLN
250.0000 mg | Freq: Two times a day (BID) | INTRAVENOUS | Status: DC
  Filled 2021-08-27: qty 4

## 2021-08-27 MED ORDER — DIPHENHYDRAMINE HCL 50 MG/ML IJ SOLN
25.0000 mg | Freq: Once | INTRAMUSCULAR | Status: AC
  Administered 2021-08-27: 25 mg via INTRAVENOUS
  Filled 2021-08-27: qty 1

## 2021-08-27 MED ORDER — METHYLPREDNISOLONE SODIUM SUCC 125 MG IJ SOLR: 125.0000 mg | Freq: Two times a day (BID) | INTRAMUSCULAR | Status: DC

## 2021-08-27 MED ORDER — ACETAMINOPHEN 325 MG PO TABS
650.0000 mg | ORAL_TABLET | Freq: Once | ORAL | Status: AC
  Administered 2021-08-27: 650 mg
  Filled 2021-08-27: qty 2

## 2021-08-27 MED ORDER — FUROSEMIDE 10 MG/ML IJ SOLN
40.0000 mg | Freq: Once | INTRAMUSCULAR | Status: AC
Start: 2021-08-27 — End: 2021-08-27
  Administered 2021-08-27: 40 mg via INTRAVENOUS
  Filled 2021-08-27: qty 4

## 2021-08-27 MED ORDER — METOPROLOL TARTRATE 25 MG PO TABS
12.5000 mg | ORAL_TABLET | Freq: Two times a day (BID) | ORAL | Status: DC
  Administered 2021-08-27 – 2021-09-20 (×37): 12.5 mg
  Filled 2021-08-27 (×44): qty 1

## 2021-08-27 MED ORDER — METHYLPREDNISOLONE SODIUM SUCC 40 MG IJ SOLR: 40.0000 mg | Freq: Two times a day (BID) | INTRAMUSCULAR | Status: DC

## 2021-08-27 MED ORDER — IMMUNE GLOBULIN (HUMAN) 10 GM/100ML IV SOLN
400.0000 mg/kg | INTRAVENOUS | Status: DC
  Administered 2021-08-27: 10 g via INTRAVENOUS
  Administered 2021-08-28: 30 g via INTRAVENOUS
  Filled 2021-08-27 (×6): qty 300

## 2021-08-27 MED ORDER — FUROSEMIDE 10 MG/ML IJ SOLN
40.0000 mg | Freq: Once | INTRAMUSCULAR | Status: AC
  Administered 2021-08-27: 40 mg via INTRAVENOUS
  Filled 2021-08-27: qty 4

## 2021-08-27 MED ORDER — PREDNISONE 20 MG PO TABS: 40.0000 mg | ORAL_TABLET | Freq: Every day | ORAL | Status: DC

## 2021-08-27 MED ORDER — METOPROLOL TARTRATE 25 MG PO TABS
12.5000 mg | ORAL_TABLET | Freq: Two times a day (BID) | ORAL | Status: DC
  Filled 2021-08-27: qty 1

## 2021-08-27 NOTE — Progress Notes (Addendum)
Subjective: ?Patient examined this morning with fentanyl drip.  Appears similar to yesterday, eyes open spontaneously to verbal stimulation.  No attempt to track the examiner.  Patient blinks to threat bilaterally.  Patient withdraws to pain only on the left side.  Does not follow commands. ? ?Exam: ?Vitals:  ? 08/27/21 0900 08/27/21 1000  ?BP: 103/72 118/79  ?Pulse: (!) 115 (!) 121  ?Resp: 15 18  ?Temp:    ?SpO2: 95% 94%  ? ?Gen: critically ill female, intubated on Fentanyl gtt ?Resp: non-labored breathing, no acute distress ?Abd: soft, nt ?Skin: Multiple bruising all over body. Generalized edema  ? ?Neuro: ?MS: sedated. Eyes open to verbal stimulation, does not follow commands ?CN: Pupils equal round and reactive, eyes are disconjugate. Blinks to threat bilaterally ?Motor: withdraws to noxious stimulation in all four extremities, attempt to localize on right upper ?Sensory: responds to noxious stimuli ? ?Pertinent Labs: ?Ammonia from 3/12 was 23 ?TSH 2.47 ?Adamts13 negative ?CSF RBC-one ?CSF WBC-zero ?CSF protein 69 ?CSF glucose 86 ?CSF Gram stain with no organisms ?CSF VDRL-negative ?CSF HSV PCR-negative ?CSF VZV PCR-negative ?CSF crypto antigen-negative ?CSF Lyme PCR-pending ?CSF ACE-pending ?CSF autoimmune panel-pending ?Serum autoimmune panel-sending ? ?MRI brain 3/13-Motion degraded but unremarkable ? ?CEEG 3/19-3/20:  ?This study is suggestive of severe diffuse encephalopathy, nonspecific etiology. No seizures or epileptiform discharges were seen throughout the recording. ?  ?Impression: 61 year old female with a history of rheumatoid arthritis diagnosed in November as well as subsequent diagnosis of dermatomyositis.  She presented on 3/12 with decreased oral intake, abdominal pain and constipation, and AMS.  Due to her mental status continuing to be poor on 3/16 neurology was consulted.  At that time, she had also become pancytopenic with presumed DIC of unclear etiology.  Given her thrombocytopenia, LP was  not immediately attempted, and she was started on acyclovir. ? ?Interestingly, family reported confusion since November with worsening anytime she had steroids attempted to be weaned.  Given the worsening mental status, new onset seizure without clear etiology, there was consideration of opportunistic infection versus an autoimmune process.  LP without evidence of infection, and pulse dose steroids were started. (Of note, the patient had a 3 minute seizure that occurred after central line placement).  ? 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  Patient has not exhibited significant improvement with 5-day course of Solu-Medrol.  We will start IVIG today. ? ?Recommendations: ?1) start IVIg ?2) continue Solu-Medrol, today is day 5/5  ?3) continue Keppra 500 mg twice daily for previous seizure ?4) will continue to follow.  ? ? ?Corky Sox, MD ?PGY-1 ?

## 2021-08-27 NOTE — Plan of Care (Signed)
?  Problem: Education: ?Goal: Knowledge of General Education information will improve ?Description: Including pain rating scale, medication(s)/side effects and non-pharmacologic comfort measures ?Outcome: Progressing ?  ?Problem: Clinical Measurements: ?Goal: Ability to maintain clinical measurements within normal limits will improve ?Outcome: Progressing ?  ?Problem: Activity: ?Goal: Risk for activity intolerance will decrease ?Outcome: Progressing ?  ?Problem: Nutrition: ?Goal: Adequate nutrition will be maintained ?Outcome: Progressing ?  ?Problem: Pain Managment: ?Goal: General experience of comfort will improve ?Outcome: Progressing ?  ?Problem: Skin Integrity: ?Goal: Risk for impaired skin integrity will decrease ?Outcome: Progressing ?  ?

## 2021-08-27 NOTE — Plan of Care (Signed)
  Problem: Education: Goal: Knowledge of General Education information will improve Description: Including pain rating scale, medication(s)/side effects and non-pharmacologic comfort measures Outcome: Progressing   Problem: Clinical Measurements: Goal: Ability to maintain clinical measurements within normal limits will improve Outcome: Progressing   Problem: Activity: Goal: Risk for activity intolerance will decrease Outcome: Progressing   Problem: Nutrition: Goal: Adequate nutrition will be maintained Outcome: Progressing   Problem: Skin Integrity: Goal: Risk for impaired skin integrity will decrease Outcome: Progressing   

## 2021-08-27 NOTE — Progress Notes (Signed)
? ?NAME:  Carrie [name], MRN:  [id number], DOB:  1961/06/05, LOS: 9 ?ADMISSION DATE:  08/25/2021, CONSULTATION DATE:  3/17 ?REFERRING MD:  [place], CHIEF COMPLAINT:  Confusion, dyspnea  ? ?History of Present Illness:  ?Patient is a 61 year old female with PMHx as below iniitally admitted for AMS, abdominal pain, constipation, decreased PO intake with dehydration. There were reports that patient experienced similar symptoms when her prednisone was tapered down. She was noted to be pancytopenic for which oncology was consulted. Patient thought to have folic acid deficiency. ADAMTS13 nl. 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 Bone marrow biopsy obtained - hypoproductive without blast or malignant cells noted.  ?Initial CT Head and MRI Brain negative for acute intracranial abnormalities and patient's encephalopathy was thought to be secondary to steroid withdrawal. However, on 3/16, neurology consulted for progressively worsening encephalopathy. Patient recommended for LP and start empiric abx coverage for meningitis.Patient also started on acyclovir for possible viral encephalitis. Recommended for MR imaging of spine with elictable clonus in RUE and BLE.  ?DIC panel obtained on 3/17 with elevated D-dimer and low fibronogen with several small bruises on arms with worsening pancytopenia - patient was given 1u pRBC and cryo.  Experienced 3-min seizure activity after attempted central access placement - required intubation for airway protection.  ?LP performed on 3/18 - CSF non-infectious. Patient transferred to [facility] for LTM EEG monitoring  ? ?Pertinent  Medical History  ?Rheumatoid arthritis ?Dermatomyositis ?Fatty liver ?Hyperlipidemia ?Hypertension ?Rhabdomyolysis ? ?Significant Hospital Events: ?Including procedures, antibiotic start and stop dates in addition to other pertinent events   ?03/2021 labs: ANA positive, Jo-1 neg, centromere neg,  ds DNA neg, RNP positive, SSA/SSB neg, RF positive, chromatin Ab neg, CCP neg ?MRI 3/13-no acute findings ?3/17 bone marrow biopsy ?3/12 CT abdomen and pelvis-no acute findings, adrenal nodule, diverticulosis ?3/12 CT head without contrast-no acute findings ?3/16 EEG-moderate diffuse encephalopathy of nonspecific etiology, no seizures or epileptiform discharges noted during the recording ?3/17 intubation ?3/18 LP performed: no leukocytes, protein elevated, transfer to [facility] for LTM EEG; PICC line placed ?3/20 remains on pulse dose steroids; intermittently tolerating pressure support ? ?Micro ?3/18 csf > neg to date ?3/18 csf fungus > neg  ?3/18 csf vzv > neg ?3/18 csf hsv > neg ?3/18 csf cryptococcal ag > neg ?3/18 csf vdrl > neg ?3/18 csf hsv > neg ?3/18  > lyme disease > negative  ?3/16 blood culture > neg to date ?3/12 blood > staph auricularis 1/4 bottles ?3/12 sars cov 2/flu > negative ?3/16 > parvoB19 IgG pos, IgM neg ?3/16 > EBV IgG pos, IgM neg ?3/16 > CMV IgG pos, IgM neg ?3/16 > HHV6 IgG pos ?3/17 > bone marrow biopsy > hypercellular with trilineage hematopoiesis ? ?Abx ?Rocephin 3/12 ?Acyclovir 3/16>3/19  ?Unasyn 3/19 > ? 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 She does open eyes to minimal stimuli; however, no significant neurologic improvement noted at this time.  ? ?Objective   ?Blood pressure 118/88, pulse (!) 116, temperature 98.7 ?F (37.1 ?C), temperature source Axillary, resp. rate 15, height 5' 3" (1.6 m), weight 68.8 kg, SpO2 95 %. ?CVP:  [2 mmHg-16 mmHg] 10 mmHg  ?Vent Mode: PRVC ?FiO2 (%):  [40 %-50 %] 50 % ?Set Rate:  [16 bmp] 16 bmp ?Vt Set:  [380 mL-420 mL] 420 mL ?PEEP:  [8 cmH20] 8 cmH20 ?Pressure Support:  [  8 cmH20] 8 cmH20 ?Plateau Pressure:  [14 cmH20-20 cmH20] 20 cmH20  ? ?Intake/Output Summary (Last 24 hours) at 08/27/2021 0749 ?Last data filed at 08/27/2021 0700 ?Gross per 24 hour  ?Intake 1730.49 ml  ?Output 1040 ml   ?Net 690.49 ml  ? ?Filed Weights  ? 08/25/21 0500 08/26/21 0500 08/27/21 0400  ?Weight: 72.1 kg 70.4 kg 68.8 kg  ? ? ?Examination: ?General:  Critically ill appearing middle aged female; on vent support ?HENT: NCAT ETT in place; disconjugate gaze  ?PULM: bibasilar decreased breath sounds; on full vent support  ?CV: RRR, no mgr, distal pulses intact  ?GI: BS diminished, soft, nontender ?MSK: normal bulk and tone, diffuse anasarca  ?Neuro: pupils pinpoint but reactive; opens eyes to verbal stimuli; withdraws in all extremities to painful stimuli ? ?Resolved Hospital Problem list   ?Circulatory shock  ?Assessment & Plan:  ?Acute metabolic encephalopathy > concern for autoimmune encephalitis based on available evidence, no clear sign of infection ?Patient's mental status without any significant improvement this morning. She attempts to track, similar to yesterday.  ?- Neurology consulted, appreciate recommendations ?- High dose steroids day 5/5 ?- Will discuss with neuro regarding starting PLEX vs IVIG given no significant improvement in neuro status at this time  ?- PAD protocol, fentanyl infusion per protocol, RASS target 0 to -1 ? ?Circulatory shock likely 2/2 sedation - improved off sedation ?Echo with mild diastolic dysfunction but no other abnormalities noted. Blood culture did have staph auricularis; but this is likely contaminant - does not appear to be in septic shock at this time. She is on high dose steroids; does not appear to be in adrenal crisis at this time.  ?Has not required further pressor support. Will continue with goal MAP >65 ? ?Acute respiratory faiure with hypoxemia  ?Aspiration pneumonia  ?- IV Unasyn for 7 days ?- Continue full vent support ?- VAP bundle  ? ?Pancytopenia > unclear etiology ?Previously concern for DIC for which patient received pRBC and cryo. However, labs not consistent with DIC and patient does not have oozing from venipuncture sites. Pancytopenia is in setting of underlying  bone marrow suppression and acute illness. WBC is improved. Hb and thrombocytopenia stable.  ?- Monitor for bleeding ?- Transfuse PRBC for Hgb < 7 gm/dL ?- No role for FFP/Cryo right now ?- Transfuse platelets if < 50K and bleeding or if a procedure is needed ? ?Acute renal failure ?sCr is stable this morning. Patient appears hypervolemic on exam.  ?- Diuresis IV lasix today  ?- Monitor urine output ?- Trend renal function and avoid nephrotoxic agents ?- No urgent need for dialysis at this time  ? 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 ?Shock liver ?Appears to be in obstructive pattern. Prior CT Abd/Pelv on admission without acute hepatobiliary findings.  ?- RUQ [place]  ?- Trend LFTs  ? ?Hyperglycemia ?SSI ? ?Best Practice (right click and "Reselect all SmartList Selections" daily)  ? ?Diet/type: tubefeeds ?DVT prophylaxis: SCD ?GI prophylaxis: PPI ?Lines: Central line and yes and it is still needed ?Foley:  Yes, and it is still needed ?Code Status:  full code ?Last date of multidisciplinary goals of care discussion [sister updated] ? ?Labs   ?CBC: ?Recent Labs  ?Lab 08/21/21 ?9191 08/22/21 ?0249 08/23/21 ?1443 08/23/21 ?1611 08/24/21 ?0742 08/24/21 ?2127 08/25/21 ?6606 08/25/21 ?0454 08/25/21 ?1022 08/26/21 ?0640 08/27/21 ?0419  ?  WBC 3.9*   < > 8.1  --  11.4*  --   --  4.5  --  8.3 10.2  ?NEUTROABS 2.7  --   --   --   --   --   --  3.3  --   --   --   ?HGB 8.2*   < > 9.0*  --  6.9*   < > 8.5* 8.9* 8.2* 8.0* 8.7*  ?HCT 23.9*   < > 25.6*  --  20.3*   < > 25.0* 25.6* 24.0* 22.2* 25.2*  ?MCV 86.6   < > 87.4  --  89.0  --   --  85.3  --  84.4 87.2  ?PLT 47*   < > 98* 68* 75*  --   --  55*  --  44* 48*  ? < > = values in this interval not displayed.  ? ? ?Basic Metabolic Panel: ?Recent Labs  ?Lab 08/22/21 ?2135 08/23/21 ?0242 08/23/21 ?1611 08/24/21 ?3559  08/24/21 ?1711 08/24/21 ?2127 08/25/21 ?7416 08/25/21 ?0454 08/25/21 ?1022 08/26/21 ?0640 08/27/21 ?0419  ?NA 127*   < >  --  125* 128*   < > 133* 131* 133* 132* 136  ?K 3.9   < >  --  3.0* 3.4*   <

## 2021-08-28 ENCOUNTER — Inpatient Hospital Stay (HOSPITAL_COMMUNITY): Payer: Federal, State, Local not specified - PPO

## 2021-08-28 DIAGNOSIS — E872 Acidosis, unspecified: Secondary | ICD-10-CM | POA: Diagnosis not present

## 2021-08-28 DIAGNOSIS — R531 Weakness: Secondary | ICD-10-CM | POA: Diagnosis not present

## 2021-08-28 DIAGNOSIS — G9340 Encephalopathy, unspecified: Secondary | ICD-10-CM | POA: Diagnosis not present

## 2021-08-28 DIAGNOSIS — M069 Rheumatoid arthritis, unspecified: Secondary | ICD-10-CM | POA: Diagnosis not present

## 2021-08-28 DIAGNOSIS — E8729 Other acidosis: Secondary | ICD-10-CM | POA: Diagnosis not present

## 2021-08-28 DIAGNOSIS — R4182 Altered mental status, unspecified: Secondary | ICD-10-CM | POA: Diagnosis not present

## 2021-08-28 DIAGNOSIS — D61818 Other pancytopenia: Secondary | ICD-10-CM | POA: Diagnosis not present

## 2021-08-28 DIAGNOSIS — G0490 Encephalitis and encephalomyelitis, unspecified: Secondary | ICD-10-CM | POA: Diagnosis not present

## 2021-08-28 LAB — FIBRINOGEN: Fibrinogen: 65 mg/dL — CL (ref 210–475)

## 2021-08-28 LAB — COMPREHENSIVE METABOLIC PANEL
ALT: 53 U/L — ABNORMAL HIGH (ref 0–44)
AST: 140 U/L — ABNORMAL HIGH (ref 15–41)
Albumin: 1.5 g/dL — ABNORMAL LOW (ref 3.5–5.0)
Alkaline Phosphatase: 894 U/L — ABNORMAL HIGH (ref 38–126)
Anion gap: 9 (ref 5–15)
BUN: 43 mg/dL — ABNORMAL HIGH (ref 8–23)
CO2: 25 mmol/L (ref 22–32)
Calcium: 7.4 mg/dL — ABNORMAL LOW (ref 8.9–10.3)
Chloride: 105 mmol/L (ref 98–111)
Creatinine, Ser: 1.78 mg/dL — ABNORMAL HIGH (ref 0.44–1.00)
GFR, Estimated: 32 mL/min — ABNORMAL LOW (ref >60)
Glucose, Bld: 185 mg/dL — ABNORMAL HIGH (ref 70–99)
Potassium: 3.4 mmol/L — ABNORMAL LOW (ref 3.5–5.1)
Sodium: 139 mmol/L (ref 135–145)
Total Bilirubin: 2.3 mg/dL — ABNORMAL HIGH (ref 0.3–1.2)
Total Protein: 4.6 g/dL — ABNORMAL LOW (ref 6.5–8.1)

## 2021-08-28 LAB — CBC
HCT: 21.6 % — ABNORMAL LOW (ref 36.0–46.0)
Hemoglobin: 7.2 g/dL — ABNORMAL LOW (ref 12.0–15.0)
MCH: 29.4 pg (ref 26.0–34.0)
MCHC: 33.3 g/dL (ref 30.0–36.0)
MCV: 88.2 fL (ref 80.0–100.0)
Platelets: 39 10*3/uL — ABNORMAL LOW (ref 150–400)
RBC: 2.45 MIL/uL — ABNORMAL LOW (ref 3.87–5.11)
RDW: 20 % — ABNORMAL HIGH (ref 11.5–15.5)
WBC: 7.5 10*3/uL (ref 4.0–10.5)
nRBC: 2.4 % — ABNORMAL HIGH (ref 0.0–0.2)

## 2021-08-28 LAB — HSV-2 IGG SUPPLEMENTAL TEST

## 2021-08-28 LAB — HSV(HERPES SIMPLEX VRS) I + II AB-IGG
HSV 1 Glycoprotein G Ab, IgG: <0.91 index (ref 0.00–0.90)
HSV 2 Glycoprotein G Ab, IgG: 2.8 index — ABNORMAL HIGH (ref 0.00–0.90)

## 2021-08-28 LAB — GLUCOSE, CAPILLARY
Glucose-Capillary: 180 mg/dL — ABNORMAL HIGH (ref 70–99)
Glucose-Capillary: 183 mg/dL — ABNORMAL HIGH (ref 70–99)
Glucose-Capillary: 195 mg/dL — ABNORMAL HIGH (ref 70–99)
Glucose-Capillary: 218 mg/dL — ABNORMAL HIGH (ref 70–99)
Glucose-Capillary: 190 mg/dL — ABNORMAL HIGH (ref 70–99)
Glucose-Capillary: 208 mg/dL — ABNORMAL HIGH (ref 70–99)

## 2021-08-28 LAB — FERRITIN: Ferritin: >7500 ng/mL — ABNORMAL HIGH (ref 11–307)

## 2021-08-28 LAB — TRIGLYCERIDES: Triglycerides: 229 mg/dL — ABNORMAL HIGH (ref <150)

## 2021-08-28 IMAGING — US US PELVIS COMPLETE
1 series · 14 of 25 positions shown · non-contrast
Comparison: CT done on [DATE]

CLINICAL DATA: Evaluate for ovarian mass

EXAM:
TRANSABDOMINAL ULTRASOUND OF PELVIS
TECHNIQUE: Transabdominal ultrasound examination of the pelvis was performed
including evaluation of the uterus, ovaries, adnexal regions, and
pelvic cul-de-sac.

[Series 1: us pelvis (transabdominal only) · 38 acquisitions, 14 frames shown]
[im 1/38]
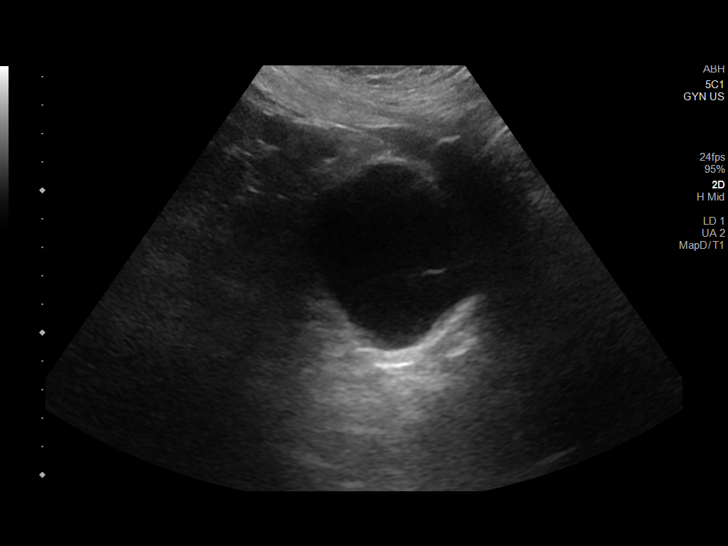
[im 4/38]
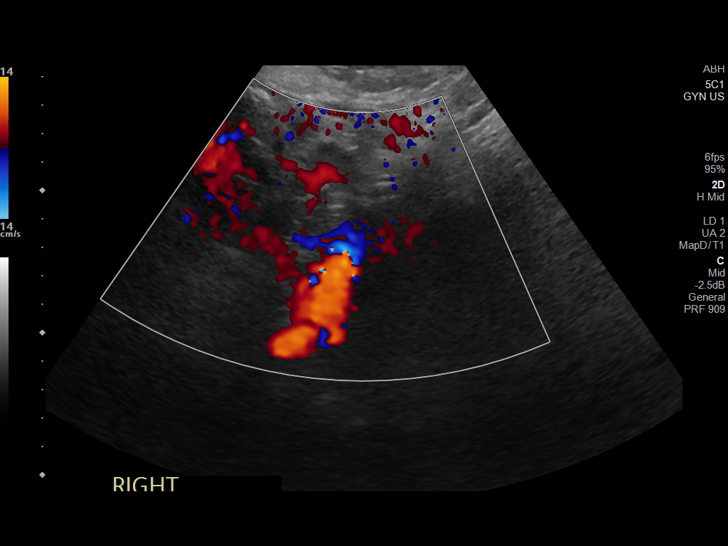
[im 7/38]
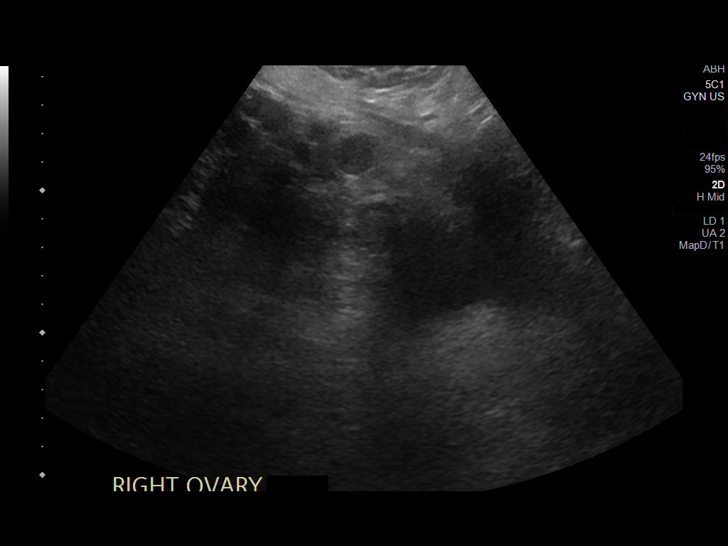
[im 10/38]
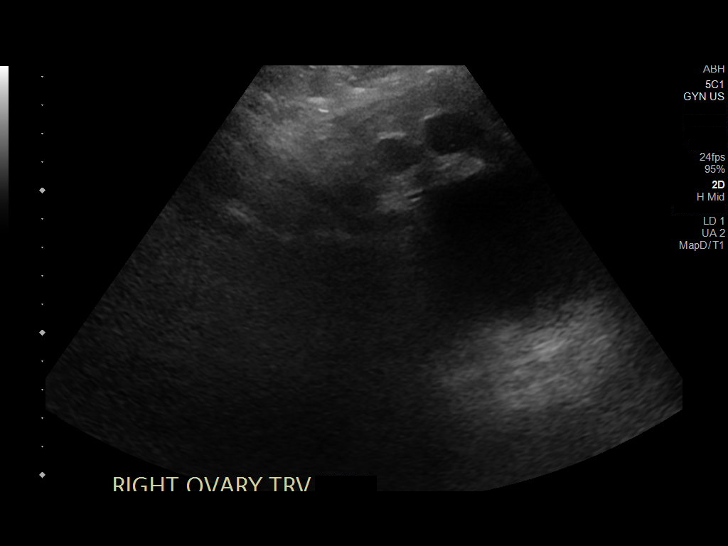
[im 13/38]
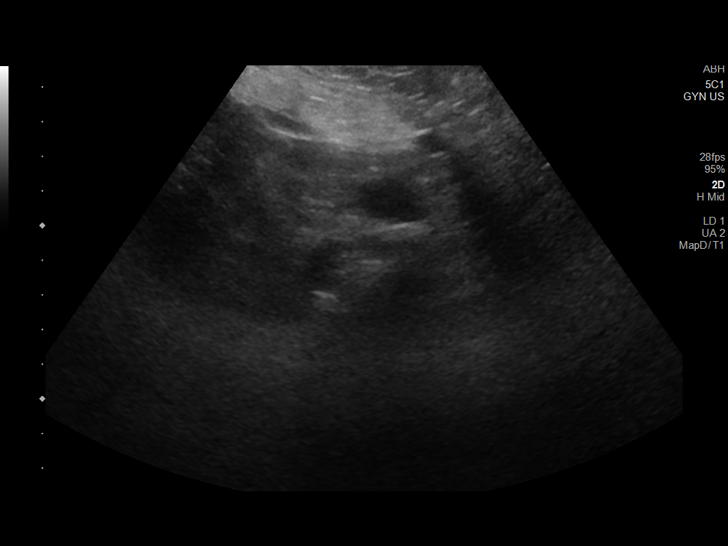
[im 14/38]
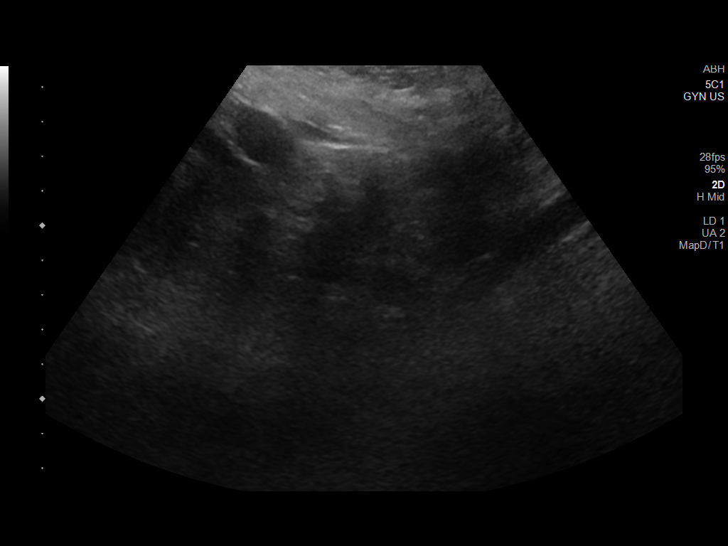
[im 17/38]
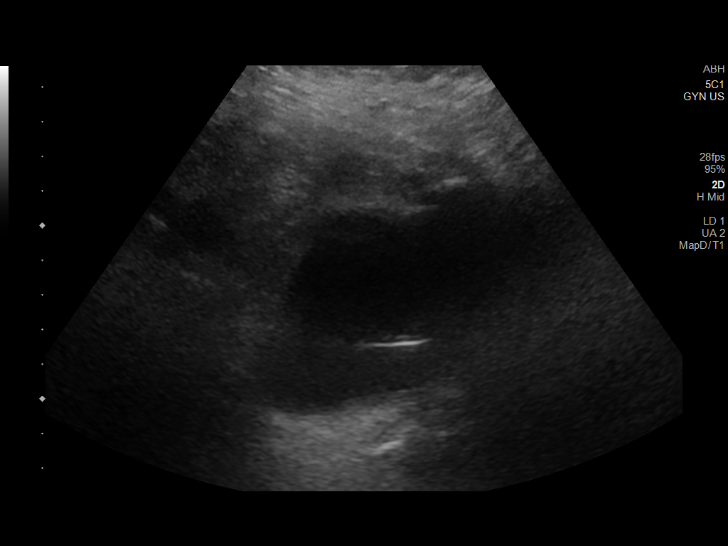
[im 21/38]
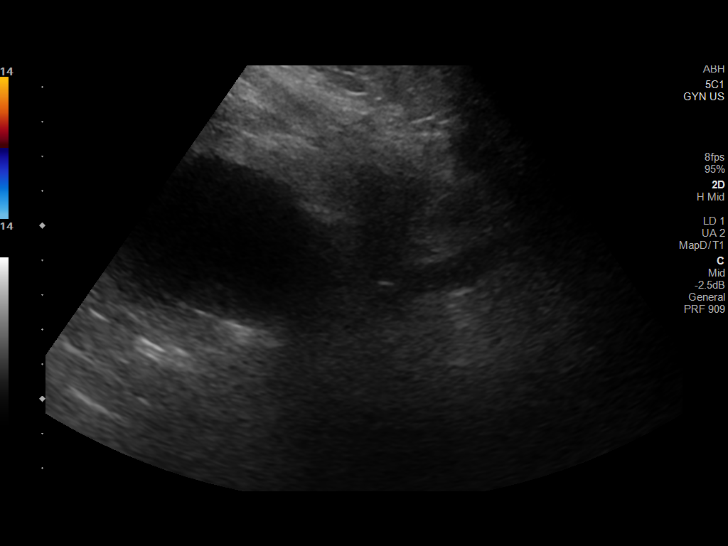
[im 24/38]
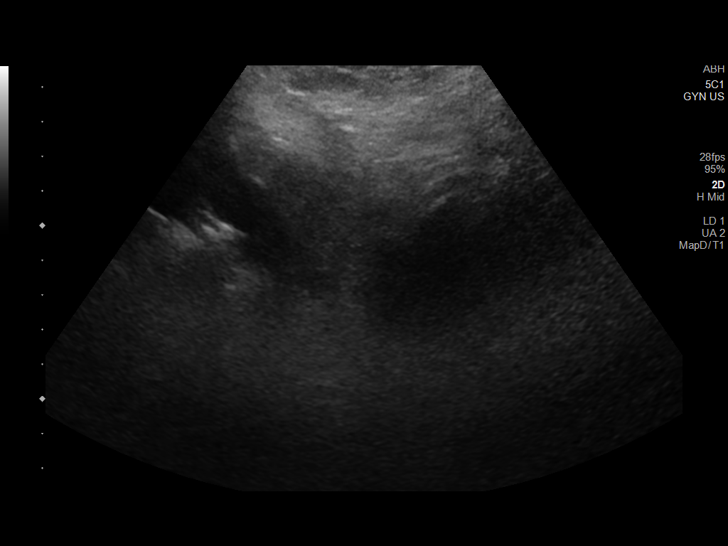
[im 25/38]
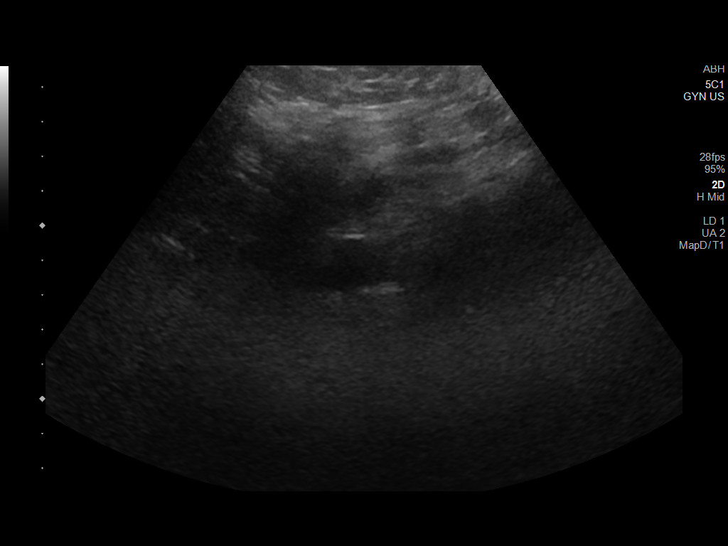
[im 28/38]
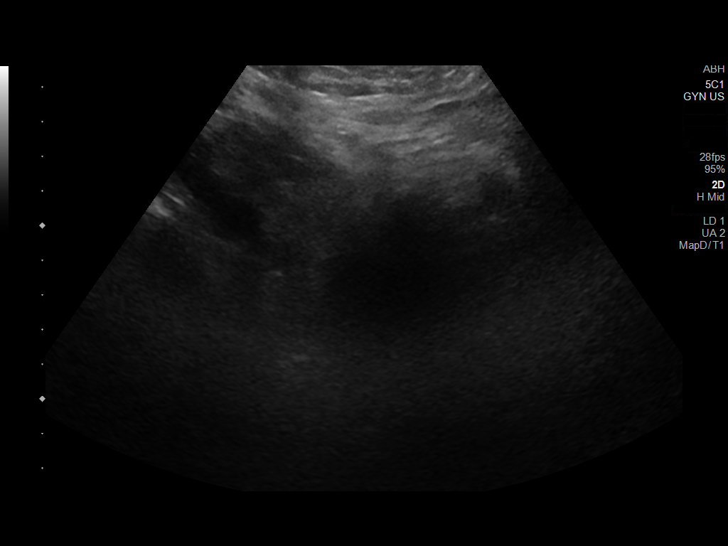
[im 31/38]
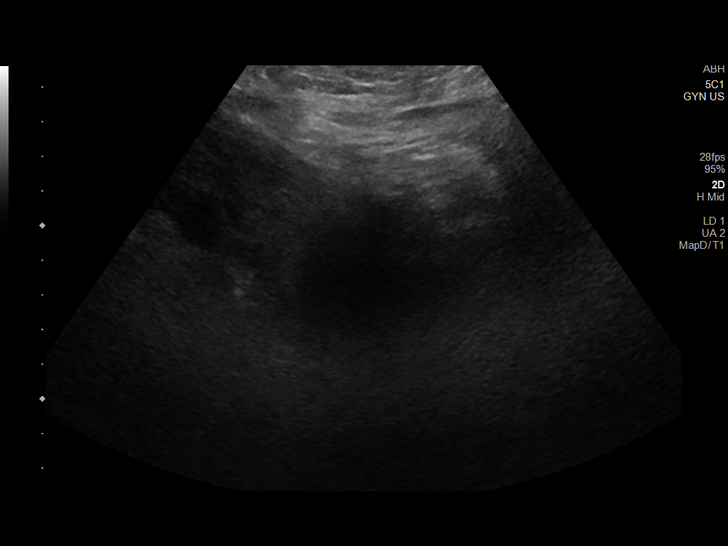
[im 34/38]
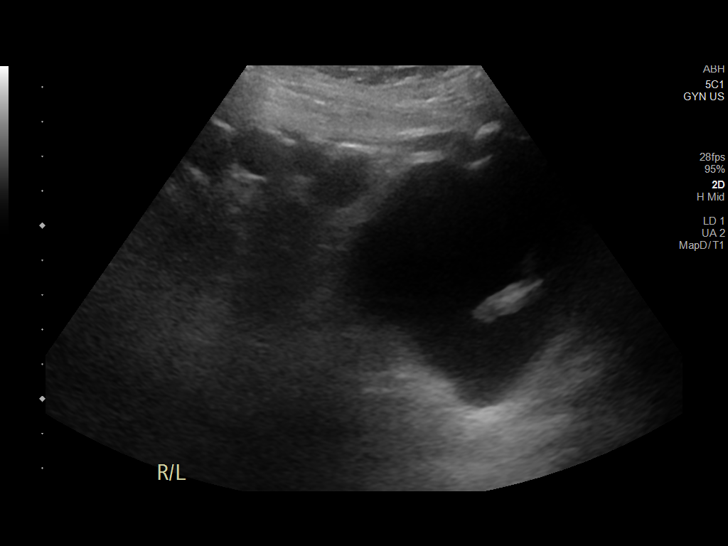
[im 38/38]
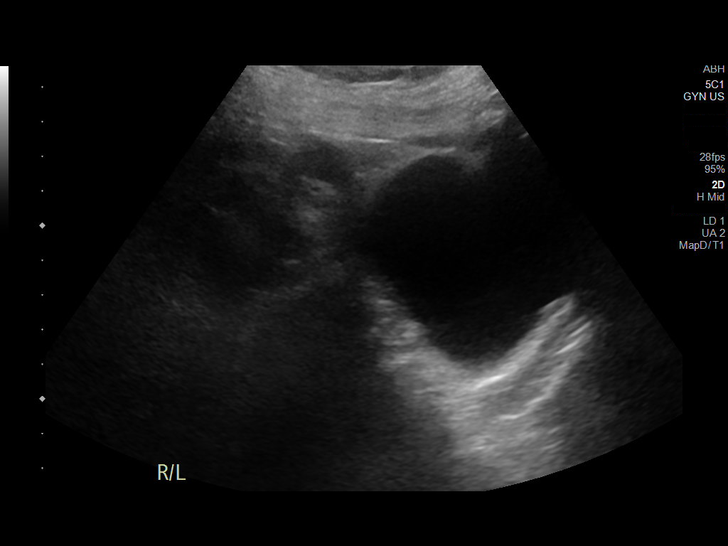

[14 of 25 positions shown; findings below may reference images not displayed]

FINDINGS: Uterus

Uterus is not seen consistent with hysterectomy.

Right ovary

Not sonographically visualized.

Left ovary

Not sonographically visualized.

Other findings:  No abnormal free fluid.
IMPRESSION: Status post hysterectomy. Ovaries are not sonographically
visualized. There are no dominant adnexal masses. There is no free
fluid in the pelvis.

## 2021-08-28 MED ORDER — SODIUM CHLORIDE 0.9 % IV SOLN: 3.0000 g | Freq: Two times a day (BID) | INTRAVENOUS | Status: DC

## 2021-08-28 MED ORDER — POTASSIUM CHLORIDE 20 MEQ PO PACK: 40.0000 meq | PACK | Freq: Once | ORAL | Status: DC

## 2021-08-28 MED ORDER — IMMUNE GLOBULIN (HUMAN) 10 GM/100ML IV SOLN
400.0000 mg/kg | INTRAVENOUS | Status: DC
  Filled 2021-08-28: qty 300

## 2021-08-28 MED ORDER — SODIUM CHLORIDE 0.9 % IV SOLN
3.0000 g | Freq: Two times a day (BID) | INTRAVENOUS | Status: AC
  Administered 2021-08-28 – 2021-08-29 (×3): 3 g via INTRAVENOUS
  Filled 2021-08-28 (×3): qty 8

## 2021-08-28 MED ORDER — POTASSIUM CHLORIDE 20 MEQ PO PACK
40.0000 meq | PACK | Freq: Once | ORAL | Status: AC
  Administered 2021-08-28: 40 meq
  Filled 2021-08-28: qty 2

## 2021-08-28 NOTE — Progress Notes (Signed)
Pt must be NPO for at least 4-6 hours before MRCP so TF stopped per MRI request.  ?

## 2021-08-28 NOTE — Progress Notes (Signed)
Dr. Merrily Pewhand notified of critical fibrinogen level of 65.  ?

## 2021-08-28 NOTE — Progress Notes (Addendum)
HEMATOLOGY-ONCOLOGY PROGRESS NOTE ? ?ASSESSMENT AND PLAN: ?61 yo female  ?  ?Acute encephalopathy, ? Autoimmune encephalitis ?Thrombocytopenia and normocytic anemia, DIC  ?AKI and hyponatremia secondary to dehydration  ?Hypertension ?Rheumatoid arthritis/dermatomyositis ?Depression and anxiety ?Stage III sacral ulcer ?Folate deficiency  ?Transaminitis ?Acute hypoxic respiratory failure ?Aspiration pneumonia ?Seizure, resolved ?  ?Recommendations: ?-Labs from today have been reviewed.  Her WBC remains normal at 7.5.  She continues to have anemia and thrombocytopenia. ?-Bone marrow biopsy results were discussed with the patient's niece today and findings were overall nonspecific.  She has hypercellular marrow with trilineage hematopoiesis and findings may be related to nutritional deficiency, immune mediated process, infection, medication, or combination of factors. ?-Recommend supportive care with PRBC transfusion for hemoglobin less than 7 or platelet transfusion for platelet count less than 20,000 or active bleeding. ?-She has been started on IVIG per neurology.  We discussed with the niece that her counts may be improved if cytopenias are immune mediated. ?-The question was raised regarding the possibility of [place].  She does have some features of [place] including fever and bicytopenia.  However, she does not have splenomegaly.  Additional work-up has been ordered including a ferritin level and triglyceride level.  A CD25 level has also been ordered.  We discussed her bone marrow biopsy results with pathology again today.  They said that there is no evidence of hemophagocytosis and thinks [place] is unlikely. ?-The patient's niece inquired about a transfer to a tertiary care center for evaluation by rheumatology.  We advised her to speak to the primary team regarding possible transfer. ? ?Mikey Bussing, DNP, AGPCNP-BC, AOCNP ? ?SUBJECTIVE:  ?Remains on the ventilator.  Niece is at the bedside.  Had temp of 101 this  afternoon.  No bleeding reported. ? ?REVIEW OF SYSTEMS:   ?Review of Systems  ?Reason unable to perform ROS: Sedated, on ventilator.  ? ?PHYSICAL EXAMINATION: ?ECOG PERFORMANCE STATUS: 4 - Bedbound ? ?Vitals:  ? 08/28/21 1100 08/28/21 1200  ?BP: (!) 165/110 (!) 152/99  ?Pulse: (!) 111 (!) 114  ?Resp: (!) 24 (!) 25  ?Temp:  (!) 101 ?F (38.3 ?C)  ?SpO2: 94% 96%  ? ?Filed Weights  ? 08/26/21 0500 08/27/21 0400 08/28/21 0356  ?Weight: 70.4 kg 68.8 kg 70.4 kg  ? ? ?Intake/Output from previous day: ?03/22 0701 - 03/23 0700 ?In: 2288.4 [I.V.:787.7; NG/GT:1036; IV Piggyback:464.8] ?Out: 2680 [Urine:2680] ? ?Physical Exam ?Vitals reviewed.  ?Constitutional:   ?   Comments: Sedated, on ventilator  ?HENT:  ?   Head: Normocephalic.  ?Musculoskeletal:  ?   Right lower leg: Edema present.  ?   Left lower leg: Edema present.  ?Skin: ?   General: Skin is warm and dry.  ?Neurological:  ?   Comments: Sedated.  ? ? ?LABORATORY DATA:  ?I have reviewed the data as listed ? ?  Latest Ref Rng & Units 08/28/2021  ?  3:57 AM 08/27/2021  ?  4:19 AM 08/26/2021  ?  6:40 AM  ?CMP  ?Glucose 70 - 99 mg/dL 185   176   257    ?BUN 8 - 23 mg/dL 43   37   30    ?Creatinine 0.44 - 1.00 mg/dL 1.78   1.64   1.65    ?Sodium 135 - 145 mmol/L 139   136   132    ?Potassium 3.5 - 5.1 mmol/L 3.4   4.1   3.8    ?Chloride 98 - 111 mmol/L 105   104  98    ?CO2 22 - 32 mmol/L _0 ?Calcium 8.9 - 10.3 mg/dL 7.4   7.7   6.6    ?Total Protein 6.5 - 8.1 g/dL 4.6   4.1   3.4    ?Total Bilirubin 0.3 - 1.2 mg/dL 2.3   2.1   1.6    ?Alkaline Phos 38 - 126 U/L 894   604   239    ?AST 15 - 41 U/L 140   120   103    ?ALT 0 - 44 U/L 53   49   42    ? ? ?Lab Results  ?Component Value Date  ? WBC 7.5 08/28/2021  ? HGB 7.2 (L) 08/28/2021  ? HCT 21.6 (L) 08/28/2021  ? MCV 88.2 08/28/2021  ? PLT 39 (L) 08/28/2021  ? NEUTROABS 3.3 08/25/2021  ? ? ?No results found for: CEA1, CEA, [id number], CA125, PSA1 ? ?DG Chest 1 View ? ?Result Date: 08/06/2021 ?CLINICAL DATA:  Dizziness,  nausea and vomiting, bilateral leg pain EXAM: CHEST  1 VIEW COMPARISON:  04/16/2021 FINDINGS: Single frontal view of the chest demonstrates an unremarkable cardiac silhouette. No acute airspace disease, effusion, or pneumothorax. No acute bony abnormalities. IMPRESSION: 1. No acute intrathoracic process. Electronically Signed   By: Randa Ngo M.D.   On: 08/06/2021 19:41  ? ?CT HEAD WO CONTRAST (5MM) ? ?Result Date: 09/01/2021 ?CLINICAL DATA:  61 year old female with history of delirium. Lower abdominal pain and constipation. EXAM: CT HEAD WITHOUT CONTRAST TECHNIQUE: Contiguous axial images were obtained from the base of the skull through the vertex without intravenous contrast. RADIATION DOSE REDUCTION: This exam was performed according to the departmental dose-optimization program which includes automated exposure control, adjustment of the mA and/or kV according to patient size and/or use of iterative reconstruction technique. COMPARISON:  Head CT 08/06/2021. FINDINGS: Brain: Mild cerebral atrophy. Patchy and confluent areas of decreased attenuation are noted throughout the deep and periventricular white matter of the cerebral hemispheres bilaterally, compatible with chronic microvascular ischemic disease. No evidence of acute infarction, hemorrhage, hydrocephalus, extra-axial collection or mass lesion/mass effect. Vascular: No hyperdense vessel or unexpected calcification. Skull: Normal. Negative for fracture or focal lesion. Sinuses/Orbits: No acute finding. Other: None. IMPRESSION: 1. No acute intracranial abnormalities. 2. Mild cerebral atrophy with chronic microvascular ischemic changes in the cerebral white matter, as above. Electronically Signed   By: Vinnie Langton M.D.   On: 09/05/2021 06:31  ? ?CT Head Wo Contrast ? ?Result Date: 08/06/2021 ?CLINICAL DATA:  Dizziness, nausea and vomiting EXAM: CT HEAD WITHOUT CONTRAST TECHNIQUE: Contiguous axial images were obtained from the base of the skull through  the vertex without intravenous contrast. RADIATION DOSE REDUCTION: This exam was performed according to the departmental dose-optimization program which includes automated exposure control, adjustment of the mA and/or kV according to patient size and/or use of iterative reconstruction technique. COMPARISON:  03/26/2021, 04/17/2021 FINDINGS: Brain: No acute infarct or hemorrhage. Lateral ventricles and midline structures are stable. No acute extra-axial fluid collections. No mass effect. Vascular: No hyperdense vessel or unexpected calcification. Skull: Normal. Negative for fracture or focal lesion. Sinuses/Orbits: No acute finding. Other: None. IMPRESSION: 1. No acute intracranial process. Electronically Signed   By: Randa Ngo M.D.   On: 08/06/2021 19:40  ? ?MR BRAIN WO CONTRAST ? ?Result Date: 08/18/2021 ?CLINICAL DATA:  Delirium.  Unable to follow commands EXAM: MRI HEAD WITHOUT CONTRAST TECHNIQUE: Multiplanar, multiecho pulse sequences of the brain and  surrounding structures were obtained without intravenous contrast. COMPARISON:  Head CT from yesterday 04/17/2021 brain MRI FINDINGS: Brain: No acute infarction, hemorrhage, hydrocephalus, extra-axial collection or mass lesion. Cerebral volume loss without specific pattern. Vascular: Normal flow voids. Skull and upper cervical spine: Normal marrow signal. Sinuses/Orbits: Bilateral cataract resection. Minor mastoid opacification on the left more than right. Negative nasopharynx. Other: Motion degraded study requiring fast brain protocol. IMPRESSION: Motion degraded brain MRI without acute finding. No change since November 2022. Electronically Signed   By: Jorje Guild M.D.   On: 08/18/2021 11:46  ? ?CT ABDOMEN PELVIS W CONTRAST ? ?Result Date: 08/15/2021 ?CLINICAL DATA:  61 year old female with history of acute onset of nonlocalized abdominal pain. EXAM: CT ABDOMEN AND PELVIS WITH CONTRAST TECHNIQUE: Multidetector CT imaging of the abdomen and pelvis was  performed using the standard protocol following bolus administration of intravenous contrast. RADIATION DOSE REDUCTION: This exam was performed according to the departmental dose-optimization program which includ

## 2021-08-28 NOTE — Discharge Planning (Addendum)
Oncology Discharge Planning Admission Note ? ?[place] at [facility] ?Address: [address] [place], [place] [address] ?Hours of Operation:  8am - 5pm, Monday - Friday  ?Clinic Contact Information:  [phone] ? ?Oncology Care Team: ?Medical Oncologist:  Dr. Truitt Merle ? ?Dr. Burr Medico and Myrtha Mantis - NP  are aware of this hospital admission dated 08/18/21 and have assessed patient at bedside. The cancer center will follow Vladimir Faster Gales?s inpatient care to assist with discharge planning as indicated by the oncologist.  We will arrange for any necessary follow up closer to discharge if possible. ? ?Disclaimer:  This [place] note does not imply a formal consult request has been made by the admitting attending for this admission or there will be an inpatient consult completed by oncology.  Please request oncology consults as per standard process as indicated. ?

## 2021-08-28 NOTE — Progress Notes (Addendum)
?  [place] [facility]   ? ?Date of Admission:  08/21/2021   Total days of antibiotics 2 acyclovir ? ? ?ID: Carrie [name] is a 61 y.o. female with  hx of RA, dermamyositis, on pred/mmf/plaquenil, admitted for chronic progressive encephalopathy and new onset pancytopenia ? ?Principal Problem: ?  Lactic acidosis ?Active Problems: ?  Hyperlipidemia ?  Hypertension ?  Generalized weakness ?  Rheumatoid arthritis flare (HCC) ?  Depression with anxiety ?  Pancytopenia ([place]) ?  Volume depletion ?  Long-term corticosteroid use ?  Open wound of right hand ?  Adrenal nodule ([place]) ?  Pressure injury of skin ?  High anion gap metabolic acidosis ?  Altered mental status ?  Metabolic encephalopathy ?  Encephalitis ?  Acute encephalopathy ? ? ? ?Subjective: ?Slight improvement in mentation ? ? ? ?Medications:  ? ARIPiprazole  5 mg Per Tube QHS  ? chlorhexidine gluconate (MEDLINE KIT)  15 mL Mouth Rinse BID  ? Chlorhexidine Gluconate Cloth  6 each Topical Q0600  ? escitalopram  20 mg Per Tube Daily  ? folic acid  1 mg Per Tube Daily  ? insulin aspart  0-15 Units Subcutaneous Q4H  ? levETIRAcetam  500 mg Per Tube BID  ? mouth rinse  15 mL Mouth Rinse 10 times per day  ? methocarbamol  500 mg Per Tube QID  ? [START ON 08/31/2021] methylPREDNISolone (SOLU-MEDROL) injection  125 mg Intravenous Q12H  ? Followed by  ? [START ON 09/03/2021] methylPREDNISolone (SOLU-MEDROL) injection  40 mg Intravenous Q12H  ? Followed by  ? [START ON 09/06/2021] predniSONE  40 mg Per Tube Q breakfast  ? metoprolol tartrate  12.5 mg Per Tube BID  ? multivitamin with minerals  1 tablet Per Tube Daily  ? mupirocin ointment   Topical BID  ? nutrition supplement (JUVEN)  1 packet Per Tube BID BM  ? pantoprazole sodium  40 mg Per Tube Daily  ? sodium chloride flush  10-40 mL Intracatheter Q12H  ? thiamine  100 mg Per Tube Daily  ? ? ?Objective: ?Vital signs in last 24 hours: ?Temp:  [98.2 ?F (36.8 ?C)-101 ?F (38.3 ?C)] 101 ?F (38.3 ?C) (03/23  1200) ?Pulse Rate:  [89-123] 107 (03/23 1400) ?Resp:  [9-29] 21 (03/23 1400) ?BP: (99-180)/(68-115) 146/93 (03/23 1400) ?SpO2:  [88 %-100 %] 99 % (03/23 1400) ?FiO2 (%):  [40 %-50 %] 50 % (03/23 1122) ?Weight:  [70.4 kg] 70.4 kg (03/23 0356) ? ?Physical Exam  ?General/constitutional: ill appearing; intubated; no interaction; no spontaneous eye opening ?HEENT: Normocephalic, PER, disconjugate gaze which has been intermittently present ?Neck supple ?CV: rrr no mrg ?Lungs: clear on vent ?Abd: Soft ?Ext: no edema ?Skin: scattered echymosis on upper extremities; right hand dorsum side a chronic 1 cm clean based ulcer to muscle present on admission ?Neuro: obtunded; no purposeful movement ?MSK: no peripheral joint swelling/tenderness/warmth ? ? ?Central line presence: RUE picc site no erythema/purulence ? ? ? ? ?Lab Results ?Recent Labs  ?  08/27/21 ?0419 08/28/21 ?0357  ?WBC 10.2 7.5  ?HGB 8.7* 7.2*  ?HCT 25.2* 21.6*  ?NA 136 139  ?K 4.1 3.4*  ?CL 104 105  ?CO2 23 25  ?BUN 37* 43*  ?CREATININE 1.64* 1.78*  ? ?Liver Panel ?Recent Labs  ?  08/27/21 ?0419 08/28/21 ?0357  ?PROT 4.1* 4.6*  ?ALBUMIN 1.8* 1.5*  ?AST 120* 140*  ?ALT 49* 53*  ?ALKPHOS 604* 894*  ?BILITOT 2.1* 2.3*  ? ?Sedimentation Rate ?Recent Labs  ?  08/26/21 ?1134  ?ESRSEDRATE 6  ? ?C-Reactive Protein ?Recent Labs  ?  08/26/21 ?1134  ?CRP 19.4*  ? ? ?Microbiology: ?reviewed ?Studies/Results: ?US PELVIS (TRANSABDOMINAL ONLY) ? ?Result Date: 08/28/2021 ?CLINICAL DATA:  Evaluate for ovarian mass EXAM: TRANSABDOMINAL ULTRASOUND OF PELVIS TECHNIQUE: Transabdominal ultrasound examination of the pelvis was performed including evaluation of the uterus, ovaries, adnexal regions, and pelvic cul-de-sac. COMPARISON:  CT done on 08/06/2021 FINDINGS: Uterus Uterus is not seen consistent with hysterectomy. Right ovary Not sonographically visualized. Left ovary Not sonographically visualized. Other findings:  No abnormal free fluid. IMPRESSION: Status post hysterectomy.  Ovaries are not sonographically visualized. There are no dominant adnexal masses. There is no free fluid in the pelvis. Electronically Signed   By: Elmer Picker M.D.   On: 08/28/2021 13:01  ? ?US Abdomen Limited RUQ (LIVER/GB) ? ?Result Date: 08/27/2021 ?CLINICAL DATA:  Elevated liver function tests. EXAM: ULTRASOUND ABDOMEN LIMITED RIGHT UPPER QUADRANT COMPARISON:  08/27/2021 CT FINDINGS: Gallbladder: Sludge filled gallbladder. No wall thickening or pericholecystic fluid. Sonographic Percell Miller sign could not be evaluated secondary to patient's clinical status. Common bile duct: Diameter: Mildly dilated for age at 9 mm. 7 mm on the ultrasound of 08/10/2021. Liver: Moderately increased hepatic echogenicity. Portal vein is patent on color Doppler imaging with normal direction of blood flow towards the liver. Other: None. IMPRESSION: Gallbladder sludge, without acute cholecystitis. Development of mild biliary duct dilatation compared to prior ultrasound. Correlate with bilirubin levels. If elevated, consider MRCP. Electronically Signed   By: Abigail Miyamoto M.D.   On: 08/27/2021 16:25   ? ?3/05 ct abd pelv with contrast ?1. No acute localizing process in the abdomen or pelvis. ?2. Cholelithiasis. ?3. Fatty infiltration of the liver. ?4. 2.2 cm left adrenal mass, probable benign adenoma. Recommend ?adrenal washout CT or chemical shift MRI ? ? 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 ? Multiple ID etiologies entertained but csf is not suggestive of active infection and mri brain no new changes to suggest meningitis or parenchymal process ? ?Working dx at this time is an autoimmune process in setting of her RA/dermatomyositis. The autoimmune panel is negative ? ? ?3/23 assessment ?No improvement on high dose pulse  steroid and IVIG thus far ?Her lft is worsening. 3/22 shows sludge but no obstruction ?3/23 pelvis u/s done to assess for mass which is negative ? 3/17 bone marrow cx hypercellular marrow ? ?Discussed with neurology/onc/icu team, low suspicion for ID process. However no clear diagnosis at this time, and given worsening lft/cytopenia consider Adams as tx might be different than current ? ?Bone marrow result reviewed by heme and no sign of hemophagocytosis (in setting previously on high dose pred/mmf) ? ?Clinically stable from respiratory standpoint/improving with amp-sulbactam for aspiration pna ? ? ?Resulted ID serology: ?CrAg negative ?Cmv igm negative; igg positive ?Acute hep panel negative ?Hiv screen negative ? 3/16 bcx negative ?3/16 cmv igg positive, igm negative ?3/16 ebv panel no acute infection ?3/16 parvovirus igg positive, igm negative ?3/18 csf vdrl, hsv, vzv negative ?3/18 csf cx negative ?3/18 csf ACE level normal range ?3/18 csf lyme serology panel negative ?3/18 urine histo ag negative ? ?Csf/serum autoimmune panel negative ? ? ?Pending ID serology: ?Csf fungal cx ?Urine histo ag ?parvovirus pcr ?Csf meningoencephalitis panel pcr ordered addon on 3/20 ? ? ? ? ? ?Plan: ?-finish 7 day amp-sulbactam on 3/26 ?-f/u csf  meningitis pcr ?-discussed case with primary team  ? ? ? ? ? ? ? ? ? ?Jabier Mutton, MD ?[facility] ?[place] ?[phone]  pager   [phone] cell ?08/28/2021, 3:04 PM ? ? ? ? ? ? ?

## 2021-08-28 NOTE — Progress Notes (Signed)
SLP Cancellation Note ? ?Patient Details ?Name: Carrie [name]  ?MRN: [id number] ?DOB: 08/20/1960 ? ? ?Cancelled treatment:       Reason Eval/Treat Not Completed: Patient not medically ready ? ? ?Sereena Marando, Riley NearingBonnie Caroline ?08/28/2021, 8:10 AM ?

## 2021-08-28 NOTE — Progress Notes (Signed)
E-Link notified patient's SBP above 160, and no PRN medications available.  ?

## 2021-08-28 NOTE — Progress Notes (Signed)
? ?NAME:  Carrie [name], MRN:  [id number], DOB:  1960/12/06, LOS: 10 ?ADMISSION DATE:  08/09/2021, CONSULTATION DATE:  3/17 ?REFERRING MD:  [place], CHIEF COMPLAINT:  Confusion, dyspnea  ? ?History of Present Illness:  ?Patient is a 61 year old female with PMHx as below iniitally admitted for AMS, abdominal pain, constipation, decreased PO intake with dehydration. There were reports that patient experienced similar symptoms when her prednisone was tapered down. She was noted to be pancytopenic for which oncology was consulted. Patient thought to have folic acid deficiency. ADAMTS13 nl. 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 Bone marrow biopsy obtained - hypoproductive without blast or malignant cells noted.  ?Initial CT Head and MRI Brain negative for acute intracranial abnormalities and patient's encephalopathy was thought to be secondary to steroid withdrawal. However, on 3/16, neurology consulted for progressively worsening encephalopathy. Patient recommended for LP and start empiric abx coverage for meningitis.Patient also started on acyclovir for possible viral encephalitis. Recommended for MR imaging of spine with elictable clonus in RUE and BLE.  ?DIC panel obtained on 3/17 with elevated D-dimer and low fibronogen with several small bruises on arms with worsening pancytopenia - patient was given 1u pRBC and cryo.  Experienced 3-min seizure activity after attempted central access placement - required intubation for airway protection.  ?LP performed on 3/18 - CSF non-infectious. Patient transferred to [facility] for LTM EEG monitoring  ? ?Pertinent  Medical History  ?Rheumatoid arthritis ?Dermatomyositis ?Fatty liver ?Hyperlipidemia ?Hypertension ?Rhabdomyolysis ? ?Significant Hospital Events: ?Including procedures, antibiotic start and stop dates in addition to other pertinent events   ?03/2021 labs: ANA positive, Jo-1 neg, centromere neg,  ds DNA neg, RNP positive, SSA/SSB neg, RF positive, chromatin Ab neg, CCP neg ?MRI 3/13-no acute findings ?3/17 bone marrow biopsy ?3/12 CT abdomen and pelvis-no acute findings, adrenal nodule, diverticulosis ?3/12 CT head without contrast-no acute findings ?3/16 EEG-moderate diffuse encephalopathy of nonspecific etiology, no seizures or epileptiform discharges noted during the recording ?3/17 intubation ?3/18 LP performed: no leukocytes, protein elevated, transfer to [facility] for LTM EEG; PICC line placed ?3/20 remains on pulse dose steroids; intermittently tolerating pressure support ?3/21 started on IVIG ? ?Micro ?3/18 csf > neg to date ?3/18 csf fungus > neg  ?3/18 csf vzv > neg ?3/18 csf hsv > neg ?3/18 csf cryptococcal ag > neg ?3/18 csf vdrl > neg ?3/18 csf hsv > neg ?3/18  > lyme disease > negative  ?3/16 blood culture > neg to date ?3/12 blood > staph auricularis 1/4 bottles ?3/12 sars cov 2/flu > negative ?3/16 > parvoB19 IgG pos, IgM neg ?3/16 > EBV IgG pos, IgM neg ?3/16 > CMV IgG pos, IgM neg ?3/16 > HHV6 IgG pos ?3/17 > bone marrow biopsy > hypercellular with trilineage hematopoiesis ? ?Abx ?Rocephin 3/12 ?Acyclovir 3/16>3/19  ?Unasyn 3/19 > ? ?Interim History / Subjective:  ?Patient started on IVIG yesterday. Mental status remains unchanged this morning. No purposeful movements.  ? ?Objective   ?Blood pressure 130/88, pulse (!) 105, temperature 99.2 ?F (37.3 ?C), temperature source Axillary, resp. rate 15, height _0  (1.6 m), weight 70.4 kg, SpO2 100 %. ?CVP:  [4 mmHg-18 mmHg] 14 mmHg  ?Vent Mode: PRVC ?FiO2 (%):  [50 %] 50 % ?Set Rate:  [16 bmp] 16 bmp ?Vt Set:  [420 mL] 420 mL ?PEEP:  [8 cmH20] 8 cmH20 ?Pressure Support:  [8 cmH20] 8 cmH20 ?Plateau Pressure:  [19 cmH20] 19 cmH20  ? ?  Intake/Output Summary (Last 24 hours) at 08/28/2021 0655 ?Last data filed at 08/28/2021 0600 ?Gross per 24 hour  ?Intake 2292.77 ml  ?Output 2680 ml  ?Net -387.23 ml  ? ?Filed Weights  ? 08/26/21 0500 08/27/21 0400 08/28/21  0356  ?Weight: 70.4 kg 68.8 kg 70.4 kg  ? ? ?Examination: ?General:  Critically ill appearing middle aged female; on vent support ?HENT: NCAT ETT in place; disconjugate gaze, PERRL ?PULM: bibasilar decreased breath sounds; on full vent support  ?CV: tachycardic, no mgr, distal pulses intact  ?GI: BS diminished, soft, nontender ?MSK: normal bulk and tone, diffuse anasarca  ?Neuro: pupils pinpoint but reactive; opens eyes to verbal stimuli; withdraws in all extremities to painful stimuli ? ?Resolved Hospital Problem list   ?Circulatory shock  ?Assessment & Plan:  ?Acute metabolic encephalopathy > concern for autoimmune encephalitis based on available evidence, no clear sign of infection ?Patient has had five days of high dose steroids. She was started on IVIG yesterday. Patient's mental status without any significant improvement this morning.  ?- Neurology consulted, appreciate recommendations ?- IVIG x5d  ?- PAD protocol, fentanyl infusion per protocol, RASS target 0 to -1 ? ?Circulatory shock likely 2/2 sedation - improved off sedation ?Echo with mild diastolic dysfunction but no other abnormalities noted. Blood culture did have staph auricularis; but this is likely contaminant - does not appear to be in septic shock at this time. She is on high dose steroids; does not appear to be in adrenal crisis at this time.  ?Has not required further pressor support. Will continue with goal MAP >65 ? ?Acute respiratory faiure with hypoxemia  ?Aspiration pneumonia  ?- IV Unasyn for 7 days ?- Continue full vent support ?- VAP bundle  ? ?Pancytopenia > unclear etiology ?Previously concern for DIC for which patient received pRBC and cryo. However, labs not consistent with DIC and patient does not have oozing from venipuncture sites. Pancytopenia is in setting of underlying bone marrow suppression and acute illness. WBC is improved. Worsening hemoglobin and thrombocytopenia this morning. No signs of active bleed at this time  although does have worsening petechia on bilat hands.  ?- Monitor for bleeding ?- Transfuse PRBC for Hgb < 7 gm/dL, will likely need transfusion tomorrow.  ?- No role for FFP/Cryo right now ?- Transfuse platelets if < 50K and bleeding or if a procedure is needed ? ?Acute renal failure ?Hypokalemia ?sCr is slightly worse this morning. Patient appears hypervolemic on exam. However, given bump in creatinine, will hold off diuresis for today. May consider albumin for hypoalbuminemia ?- Monitor urine output ?- Trend renal function and avoid nephrotoxic agents ?- No urgent need for dialysis at this time  ? 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 ?Shock liver ?Appears to be in obstructive pattern. RUQ [place] with gallbladder sludge; mild biliary duct dilatation - recommended for MRCP. Worsening liver enzymes, Alk phos and T.bili this morning.  ?- MRCP ?- Trend LFTs  ? ?Hyperglycemia ?SSI ? ?Best Practice (right click and "Reselect all SmartList Selections" daily)  ? ?Diet/type: tubefeeds ?DVT prophylaxis: SCD ?GI prophylaxis: PPI ?Lines: Central line and yes and it is still needed ?Foley:  Yes, and it is still needed ?Code Status:  full code ?Last date of multidisciplinary goals of care discussion [family updated] ? ?Labs   ?CBC: ?Recent Labs  ?Lab 08/21/21 ?7026 08/22/21 ?3785 08/24/21 ?8850 08/24/21 ?2127  08/25/21 ?0454 08/25/21 ?1022 08/26/21 ?0640 08/27/21 ?0419 08/28/21 ?0357  ?WBC 3.9*   < > 11.4*  --  4.5  --  8.3 10.2 7.5  ?NEUTROABS 2.7  --   --   --  3.3  --   --   --   --   ?HGB 8.2*   < > 6.9*   < > 8.9* 8.2* 8.0* 8.7* 7.2*  ?HCT 23.9*   < > 20.3*   < > 25.6* 24.0* 22.2* 25.2* 21.6*  ?MCV 86.6   < > 89.0  --  85.3  --  84.4 87.2 88.2  ?PLT 47*   < > 75*  --  55*  --  44* 48* 39*  ? < > = values in this interval not displayed.  ? ? ?Basic Metabolic  Panel: ?Recent Labs  ?Lab 08/22/21 ?2135 08/23/21 ?0242 08/23/21 ?1611 08/24/21 ?7543 08/24/21 ?1711 08/24/21 ?2127 08/25/21 ?6067 08/25/21 ?1022 08/26/21 ?0640 08/27/21 ?0419 08/28/21 ?0357  ?NA 127*   < >  --  125* 12

## 2021-08-28 NOTE — Progress Notes (Addendum)
Subjective: ?Patient examined this morning with fentanyl drip.  Less responsive than previous days.  Unresponsive to pain or verbal stimulus.  Strong corneal reflex and positive oculocephalic reflex. ? ?Exam: ?Vitals:  ? 08/28/21 0741 08/28/21 0800  ?BP:  (!) 133/101  ?Pulse: (!) 109 (!) 109  ?Resp: 20 14  ?Temp:  99.2 ?F (37.3 ?C)  ?SpO2: 98% 92%  ? ?Gen: critically ill female, intubated on Fentanyl gtt ?Resp: non-labored breathing, no acute distress ?Abd: soft, nt ?Skin: Multiple bruising all over body. Generalized edema  ? ?Neuro: ?MS: sedated.  Unresponsive to pain or verbal stimulus ?CN: Pupils equal round and reactive, eyes are disconjugate. Blinks to threat bilaterally ?Motor: does not withdraw to painful stimulus ?Sensory: responds to noxious stimuli ? ?Pertinent Labs: ?Ammonia from 3/12 was 23 ?TSH 2.47 ?Adamts13 negative ?CSF RBC-one ?CSF WBC-zero ?CSF protein 69 ?CSF glucose 86 ?CSF Gram stain with no organisms ?CSF VDRL-negative ?CSF HSV PCR-negative ?CSF VZV PCR-negative ?CSF crypto antigen-negative ?CSF Lyme PCR-negative ?CSF autoimmune panel-negative ?Serum autoimmune panel-negative ? ?MRI brain 3/13-Motion degraded but unremarkable ? ?CEEG 3/19-3/20:  ?This study is suggestive of severe diffuse encephalopathy, nonspecific etiology. No seizures or epileptiform discharges were seen throughout the recording. ?  ?Impression: 61 year old female with a history of rheumatoid arthritis diagnosed in November as well as subsequent diagnosis of dermatomyositis.  She presented on 3/12 with decreased oral intake, abdominal pain and constipation, and AMS.  Due to her mental status continuing to be poor on 3/16 neurology was consulted.  At that time, she had also become pancytopenic with presumed DIC of unclear etiology.  Given her thrombocytopenia, LP was not immediately attempted, and she was started on acyclovir. ? ?Interestingly, family reported confusion since November with worsening anytime she had steroids  attempted to be weaned.  Given the worsening mental status, new onset seizure without clear etiology, there was consideration of opportunistic infection versus an autoimmune process.  LP without evidence of infection, and pulse dose steroids were started. (Of note, the patient had a 3 minute seizure that occurred after central line placement).  ? 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  Patient has not exhibited significant improvement with 5-day course of Solu-Medrol.  [facility] ? ?Recommendations: ?1) Continue IVIg (day 2/5) ?2) continue Keppra 500 mg twice daily for previous seizure ?3) will continue to follow.  ? ? ?Carrie ReichertNick Merrin Mcvicker, MD ?PGY-1 ?

## 2021-08-29 ENCOUNTER — Other Ambulatory Visit: Payer: Self-pay

## 2021-08-29 DIAGNOSIS — D761 Hemophagocytic lymphohistiocytosis: Secondary | ICD-10-CM | POA: Diagnosis not present

## 2021-08-29 DIAGNOSIS — R4182 Altered mental status, unspecified: Secondary | ICD-10-CM | POA: Diagnosis not present

## 2021-08-29 DIAGNOSIS — G9340 Encephalopathy, unspecified: Secondary | ICD-10-CM | POA: Diagnosis not present

## 2021-08-29 DIAGNOSIS — D61818 Other pancytopenia: Secondary | ICD-10-CM | POA: Diagnosis not present

## 2021-08-29 DIAGNOSIS — E872 Acidosis, unspecified: Secondary | ICD-10-CM | POA: Diagnosis not present

## 2021-08-29 LAB — BLOOD CULTURE ID PANEL (REFLEXED) - BCID2

## 2021-08-29 LAB — CBC
HCT: 19.1 % — ABNORMAL LOW (ref 36.0–46.0)
Hemoglobin: 6.5 g/dL — CL (ref 12.0–15.0)
MCH: 30.2 pg (ref 26.0–34.0)
MCHC: 34 g/dL (ref 30.0–36.0)
MCV: 88.8 fL (ref 80.0–100.0)
Platelets: 37 10*3/uL — ABNORMAL LOW (ref 150–400)
RBC: 2.15 MIL/uL — ABNORMAL LOW (ref 3.87–5.11)
RDW: 20.2 % — ABNORMAL HIGH (ref 11.5–15.5)
WBC: 6.7 10*3/uL (ref 4.0–10.5)
nRBC: 3.3 % — ABNORMAL HIGH (ref 0.0–0.2)

## 2021-08-29 LAB — COMPREHENSIVE METABOLIC PANEL
ALT: 52 U/L — ABNORMAL HIGH (ref 0–44)
AST: 121 U/L — ABNORMAL HIGH (ref 15–41)
Albumin: <1.5 g/dL — ABNORMAL LOW (ref 3.5–5.0)
Alkaline Phosphatase: 661 U/L — ABNORMAL HIGH (ref 38–126)
Anion gap: 4 — ABNORMAL LOW (ref 5–15)
BUN: 58 mg/dL — ABNORMAL HIGH (ref 8–23)
CO2: 27 mmol/L (ref 22–32)
Calcium: 7.4 mg/dL — ABNORMAL LOW (ref 8.9–10.3)
Chloride: 109 mmol/L (ref 98–111)
Creatinine, Ser: 1.52 mg/dL — ABNORMAL HIGH (ref 0.44–1.00)
GFR, Estimated: 39 mL/min — ABNORMAL LOW (ref >60)
Glucose, Bld: 139 mg/dL — ABNORMAL HIGH (ref 70–99)
Potassium: 3.7 mmol/L (ref 3.5–5.1)
Sodium: 140 mmol/L (ref 135–145)
Total Bilirubin: 1.3 mg/dL — ABNORMAL HIGH (ref 0.3–1.2)
Total Protein: 4.8 g/dL — ABNORMAL LOW (ref 6.5–8.1)

## 2021-08-29 LAB — GLUCOSE, CAPILLARY
Glucose-Capillary: 142 mg/dL — ABNORMAL HIGH (ref 70–99)
Glucose-Capillary: 139 mg/dL — ABNORMAL HIGH (ref 70–99)
Glucose-Capillary: 117 mg/dL — ABNORMAL HIGH (ref 70–99)
Glucose-Capillary: 159 mg/dL — ABNORMAL HIGH (ref 70–99)
Glucose-Capillary: 180 mg/dL — ABNORMAL HIGH (ref 70–99)
Glucose-Capillary: 239 mg/dL — ABNORMAL HIGH (ref 70–99)

## 2021-08-29 LAB — MISC LABCORP TEST (SEND OUT): Labcorp test code: 505750

## 2021-08-29 LAB — BLASTOMYCES ANTIGEN: Blastomyces Antigen: NOT DETECTED ng/mL

## 2021-08-29 LAB — PREPARE RBC (CROSSMATCH)

## 2021-08-29 MED ORDER — SODIUM CHLORIDE 0.9% FLUSH: 10.0000 mL | INTRAVENOUS | Status: DC | PRN

## 2021-08-29 MED ORDER — SODIUM CHLORIDE 0.9 % IV SOLN: Freq: Once | INTRAVENOUS | Status: AC

## 2021-08-29 MED ORDER — SENNOSIDES-DOCUSATE SODIUM 8.6-50 MG PO TABS
2.0000 | ORAL_TABLET | Freq: Every day | ORAL | Status: DC
  Administered 2021-08-29 – 2021-08-30 (×2): 2
  Filled 2021-08-29 (×2): qty 2

## 2021-08-29 MED ORDER — ALTEPLASE 2 MG IJ SOLR: 2.0000 mg | Freq: Once | INTRAMUSCULAR | Status: DC | PRN

## 2021-08-29 MED ORDER — POTASSIUM CHLORIDE 20 MEQ PO PACK
40.0000 meq | PACK | Freq: Once | ORAL | Status: AC
  Administered 2021-08-29: 40 meq
  Filled 2021-08-29: qty 2

## 2021-08-29 MED ORDER — LACTULOSE 10 GM/15ML PO SOLN
20.0000 g | Freq: Two times a day (BID) | ORAL | Status: DC
  Administered 2021-08-29 – 2021-09-01 (×8): 20 g
  Filled 2021-08-29 (×8): qty 30

## 2021-08-29 MED ORDER — SODIUM CHLORIDE 0.9 % IV SOLN
20.0000 mg | Freq: Every day | INTRAVENOUS | Status: DC
  Administered 2021-08-29 – 2021-08-30 (×2): 20 mg via INTRAVENOUS
  Filled 2021-08-29 (×3): qty 2

## 2021-08-29 MED ORDER — PROCHLORPERAZINE MALEATE 10 MG PO TABS: 10.0000 mg | ORAL_TABLET | Freq: Once | ORAL | Status: DC

## 2021-08-29 MED ORDER — HEPARIN SOD (PORK) LOCK FLUSH 100 UNIT/ML IV SOLN
500.0000 [IU] | Freq: Once | INTRAVENOUS | Status: DC | PRN
  Filled 2021-08-29: qty 5

## 2021-08-29 MED ORDER — SODIUM CHLORIDE 0.9% IV SOLUTION: Freq: Once | INTRAVENOUS | Status: AC

## 2021-08-29 MED ORDER — VITAL AF 1.2 CAL PO LIQD
1000.0000 mL | ORAL | Status: DC
  Administered 2021-08-30 – 2021-09-03 (×5): 1000 mL

## 2021-08-29 MED ORDER — SODIUM CHLORIDE 0.9 % IV SOLN
2.5000 mg | Freq: Every day | INTRAVENOUS | Status: DC
  Filled 2021-08-29: qty 0.25

## 2021-08-29 MED ORDER — DEXAMETHASONE SODIUM PHOSPHATE 4 MG/ML IJ SOLN: 4.0000 mg | Freq: Once | INTRAMUSCULAR | Status: DC

## 2021-08-29 MED ORDER — HOT PACK MISC ONCOLOGY
1.0000 | Freq: Once | Status: AC | PRN
  Filled 2021-08-29: qty 1

## 2021-08-29 MED ORDER — FENTANYL BOLUS VIA INFUSION
50.0000 ug | INTRAVENOUS | Status: DC | PRN
  Administered 2021-08-29 – 2021-08-30 (×6): 100 ug via INTRAVENOUS
  Administered 2021-08-30: 150 ug via INTRAVENOUS
  Administered 2021-08-30 – 2021-08-31 (×6): 100 ug via INTRAVENOUS
  Administered 2021-08-31: 150 ug via INTRAVENOUS
  Administered 2021-08-31 (×4): 100 ug via INTRAVENOUS
  Administered 2021-08-31 (×2): 150 ug via INTRAVENOUS
  Administered 2021-08-31 – 2021-09-06 (×2): 100 ug via INTRAVENOUS
  Administered 2021-09-06 – 2021-09-12 (×8): 50 ug via INTRAVENOUS
  Administered 2021-09-14 (×2): 100 ug via INTRAVENOUS
  Administered 2021-09-15: 50 ug via INTRAVENOUS
  Administered 2021-09-16 – 2021-09-17 (×2): 100 ug via INTRAVENOUS
  Filled 2021-08-29: qty 200

## 2021-08-29 MED ORDER — SODIUM CHLORIDE 0.9% FLUSH: 3.0000 mL | INTRAVENOUS | Status: DC | PRN

## 2021-08-29 MED ORDER — PROCHLORPERAZINE EDISYLATE 10 MG/2ML IJ SOLN
10.0000 mg | Freq: Once | INTRAMUSCULAR | Status: AC
  Administered 2021-08-29: 10 mg via INTRAVENOUS
  Filled 2021-08-29: qty 2

## 2021-08-29 MED ORDER — SODIUM CHLORIDE 0.9 % IV SOLN: 20.0000 mg | Freq: Once | INTRAVENOUS | Status: DC

## 2021-08-29 MED ORDER — DEXMEDETOMIDINE HCL IN NACL 400 MCG/100ML IV SOLN
0.4000 ug/kg/h | INTRAVENOUS | Status: DC
  Administered 2021-08-29: 0.4 ug/kg/h via INTRAVENOUS
  Administered 2021-08-30: 1.2 ug/kg/h via INTRAVENOUS
  Administered 2021-08-30: 0.8 ug/kg/h via INTRAVENOUS
  Administered 2021-08-30: 1 ug/kg/h via INTRAVENOUS
  Administered 2021-08-30 – 2021-09-01 (×8): 1.2 ug/kg/h via INTRAVENOUS
  Administered 2021-09-01: 1 ug/kg/h via INTRAVENOUS
  Administered 2021-09-01 – 2021-09-02 (×3): 1.2 ug/kg/h via INTRAVENOUS
  Filled 2021-08-29 (×15): qty 100

## 2021-08-29 MED ORDER — SODIUM CHLORIDE 0.9 % IV SOLN
100.0000 mg | INTRAVENOUS | Status: AC
  Administered 2021-08-29 – 2021-09-14 (×17): 100 mg via INTRAVENOUS
  Filled 2021-08-29 (×2): qty 5
  Filled 2021-08-29: qty 10
  Filled 2021-08-29 (×4): qty 5
  Filled 2021-08-29: qty 10
  Filled 2021-08-29 (×12): qty 5

## 2021-08-29 MED ORDER — SODIUM CHLORIDE 0.9 % IV SOLN
10.0000 mg | Freq: Every day | INTRAVENOUS | Status: DC
  Filled 2021-08-29: qty 1

## 2021-08-29 MED ORDER — PROSOURCE TF PO LIQD
45.0000 mL | Freq: Every day | ORAL | Status: DC
  Administered 2021-08-29 – 2021-09-03 (×6): 45 mL
  Filled 2021-08-29 (×6): qty 45

## 2021-08-29 MED ORDER — SODIUM CHLORIDE 0.9 % IV SOLN
112.5000 mg/m2 | Freq: Once | INTRAVENOUS | Status: AC
  Administered 2021-08-29: 200 mg via INTRAVENOUS
  Filled 2021-08-29: qty 10

## 2021-08-29 MED ORDER — SODIUM CHLORIDE 0.9 % IV SOLN
5.0000 mg | Freq: Every day | INTRAVENOUS | Status: DC
  Filled 2021-08-29: qty 0.5

## 2021-08-29 MED ORDER — HEPARIN SOD (PORK) LOCK FLUSH 100 UNIT/ML IV SOLN
250.0000 [IU] | Freq: Once | INTRAVENOUS | Status: DC | PRN
  Filled 2021-08-29: qty 2.5

## 2021-08-29 NOTE — Progress Notes (Signed)
eLink Physician-Brief Progress Note ?Patient Name: Carrie [name]  ?DOB: 12/19/1960 ?MRN: [id number] ? ? ?Date of Service ? 08/29/2021  ?HPI/Events of Note ? Notified of Hgb 6.5, from 7.2 yesterday.  ?AS per bedside RN, there is no active bleed.  ?Pt not tachycardic. She is off pressor support, MAP >65, but SBP <90.   ?eICU Interventions ? Placed order to transfuse 1 unit pRBC.   ? ? ? ?  ? ?Thurnell GarbeNTON M DELA CRUZ ?08/29/2021, 5:32 AM ?

## 2021-08-29 NOTE — Progress Notes (Signed)
Etoposide verified at bedside with another chemotherapy certified RN, Carley Hammed, via facetime. Consent received via telephone with patient sister Chrys Racer and verified by second RN Carley Hammed, and given to unit secretary ?

## 2021-08-29 NOTE — Progress Notes (Addendum)
Ok to treat with [place] from 08/25/21. ? ?Raul Del [place], [place], BCPS, BCOP ?08/29/2021 ?12:31 PM ? ?

## 2021-08-29 NOTE — Progress Notes (Addendum)
HEMATOLOGY-ONCOLOGY PROGRESS NOTE ? ?ASSESSMENT AND PLAN: ?61 yo female  ?  ?Acute encephalopathy, ? 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  ?Recommendations: ?-Labs from today have been reviewed.  WBC remains normal but hemoglobin and platelets remain low. ?-Additional information has returned including a significantly elevated ferritin level.  The patient has many features consistent with [place] despite the fact that bone marrow biopsy was not fully convincing of [place].  However, features may be evolving and not apparent in the bone marrow biopsy sample that was obtained about 1 week ago. ?-Had extensive discussions with the patient's family as well as other members of the healthcare team who all agree features are most consistent with [place].  Therefore, will initiate treatment for [place] including dexamethasone and etoposide. ?-Adverse effects of this treatment have been discussed with the patient's family and consent has been obtained from them.  Plan to administer dexamethasone 20 mg IV daily for 2 weeks and administer etoposide twice a week for 2 weeks.  Okay to proceed with chemotherapy despite counts today. ?-Recommend supportive care with PRBC transfusion for hemoglobin less than 7 or platelet transfusion for platelet count less than 20,000 or active bleeding. ?-Recommend cryo for fibrinogen less than 100. ? ?Mikey Bussing, DNP, AGPCNP-BC, AOCNP ? ?SUBJECTIVE: Remains on the ventilator.  No bleeding reported.  Continues to have fevers.  Niece is at the bedside. ? ? ?REVIEW OF SYSTEMS:   ?Review of Systems  ?Reason unable to perform ROS: Sedated, on ventilator.  ? ?PHYSICAL EXAMINATION: ?ECOG  PERFORMANCE STATUS: 4 - Bedbound ? ?Vitals:  ? 08/29/21 1015 08/29/21 1200  ?BP: (!) 84/60   ?Pulse: 81   ?Resp: 16   ?Temp: 98.1 ?F (36.7 ?C) 99.1 ?F (37.3 ?C)  ?SpO2: 96%   ? ?Filed Weights  ? 08/27/21 0400 08/28/21 0356 08/29/21 0500  ?Weight: 68.8 kg 70.4 kg 69.2 kg  ? ? ?Intake/Output from previous day: ?03/23 0701 - 03/24 0700 ?In: 1544.8 [I.V.:571.8; NG/GT:665; IV Piggyback:307.9] ?Out: 1745 [[id number]] ? ?Physical Exam ?Vitals reviewed.  ?Constitutional:   ?   Comments: Sedated, on ventilator  ?HENT:  ?   Head: Normocephalic.  ?Musculoskeletal:  ?   Right lower leg: Edema present.  ?   Left lower leg: Edema present.  ?Skin: ?   General: Skin is warm and dry.  ?Neurological:  ?   Comments: Sedated.  ? ? ?LABORATORY DATA:  ?I have reviewed the data as listed ? ?  Latest Ref Rng & Units 08/29/2021  ?  4:14 AM 08/28/2021  ?  3:57 AM 08/27/2021  ?  4:19 AM  ?CMP  ?Glucose 70 - 99 mg/dL 139   185   176    ?BUN 8 - 23 mg/dL 58   43   37    ?Creatinine 0.44 - 1.00 mg/dL 1.52   1.78   1.64    ?Sodium 135 - 145 mmol/L 140   139   136    ?Potassium 3.5 - 5.1 mmol/L 3.7   3.4   4.1    ?Chloride 98 - 111 mmol/L 109   105   104    ?CO2 22 - 32 mmol/L $RemoveB'27   25   23    'yBXNbLuY$ ?Calcium 8.9 - 10.3 mg/dL 7.4   7.4  7.7    ?Total Protein 6.5 - 8.1 g/dL 4.8   4.6   4.1    ?Total Bilirubin 0.3 - 1.2 mg/dL 1.3   2.3   2.1    ?Alkaline Phos 38 - 126 U/L 661   894   604    ?AST 15 - 41 U/L 121   140   120    ?ALT 0 - 44 U/L 52   53   49    ? ? ?Lab Results  ?Component Value Date  ? WBC 6.7 08/29/2021  ? HGB 6.5 (LL) 08/29/2021  ? HCT 19.1 (L) 08/29/2021  ? MCV 88.8 08/29/2021  ? PLT 37 (L) 08/29/2021  ? NEUTROABS 3.3 08/25/2021  ? ? ?No results found for: CEA1, CEA, [id number], CA125, PSA1 ? ?DG Chest 1 View ? ?Result Date: 08/06/2021 ?CLINICAL DATA:  Dizziness, nausea and vomiting, bilateral leg pain EXAM: CHEST  1 VIEW COMPARISON:  04/16/2021 FINDINGS: Single frontal view of the chest demonstrates an unremarkable cardiac silhouette. No acute  airspace disease, effusion, or pneumothorax. No acute bony abnormalities. IMPRESSION: 1. No acute intrathoracic process. Electronically Signed   By: Randa Ngo M.D.   On: 08/06/2021 19:41  ? ?CT HEAD WO CONTRAST (5MM) ? ?Result Date: 08/08/2021 ?CLINICAL DATA:  61 year old female with history of delirium. Lower abdominal pain and constipation. EXAM: CT HEAD WITHOUT CONTRAST TECHNIQUE: Contiguous axial images were obtained from the base of the skull through the vertex without intravenous contrast. RADIATION DOSE REDUCTION: This exam was performed according to the departmental dose-optimization program which includes automated exposure control, adjustment of the mA and/or kV according to patient size and/or use of iterative reconstruction technique. COMPARISON:  Head CT 08/06/2021. FINDINGS: Brain: Mild cerebral atrophy. Patchy and confluent areas of decreased attenuation are noted throughout the deep and periventricular white matter of the cerebral hemispheres bilaterally, compatible with chronic microvascular ischemic disease. No evidence of acute infarction, hemorrhage, hydrocephalus, extra-axial collection or mass lesion/mass effect. Vascular: No hyperdense vessel or unexpected calcification. Skull: Normal. Negative for fracture or focal lesion. Sinuses/Orbits: No acute finding. Other: None. IMPRESSION: 1. No acute intracranial abnormalities. 2. Mild cerebral atrophy with chronic microvascular ischemic changes in the cerebral white matter, as above. Electronically Signed   By: Vinnie Langton M.D.   On: 08/22/2021 06:31  ? ?CT Head Wo Contrast ? ?Result Date: 08/06/2021 ?CLINICAL DATA:  Dizziness, nausea and vomiting EXAM: CT HEAD WITHOUT CONTRAST TECHNIQUE: Contiguous axial images were obtained from the base of the skull through the vertex without intravenous contrast. RADIATION DOSE REDUCTION: This exam was performed according to the departmental dose-optimization program which includes automated exposure  control, adjustment of the mA and/or kV according to patient size and/or use of iterative reconstruction technique. COMPARISON:  03/26/2021, 04/17/2021 FINDINGS: Brain: No acute infarct or hemorrhage. Lateral ventricles and midline structures are stable. No acute extra-axial fluid collections. No mass effect. Vascular: No hyperdense vessel or unexpected calcification. Skull: Normal. Negative for fracture or focal lesion. Sinuses/Orbits: No acute finding. Other: None. IMPRESSION: 1. No acute intracranial process. Electronically Signed   By: Randa Ngo M.D.   On: 08/06/2021 19:40  ? ?MR BRAIN WO CONTRAST ? ?Result Date: 08/18/2021 ?CLINICAL DATA:  Delirium.  Unable to follow commands EXAM: MRI HEAD WITHOUT CONTRAST TECHNIQUE: Multiplanar, multiecho pulse sequences of the brain and surrounding structures were obtained without intravenous contrast. COMPARISON:  Head CT from yesterday 04/17/2021 brain MRI FINDINGS: Brain: No acute infarction, hemorrhage, hydrocephalus, extra-axial collection or mass lesion. Cerebral volume  loss without specific pattern. Vascular: Normal flow voids. Skull and upper cervical spine: Normal marrow signal. Sinuses/Orbits: Bilateral cataract resection. Minor mastoid opacification on the left more than right. Negative nasopharynx. Other: Motion degraded study requiring fast brain protocol. IMPRESSION: Motion degraded brain MRI without acute finding. No change since November 2022. Electronically Signed   By: Jorje Guild M.D.   On: 08/18/2021 11:46  ? ?US PELVIS (TRANSABDOMINAL ONLY) ? ?Result Date: 08/28/2021 ?CLINICAL DATA:  Evaluate for ovarian mass EXAM: TRANSABDOMINAL ULTRASOUND OF PELVIS TECHNIQUE: Transabdominal ultrasound examination of the pelvis was performed including evaluation of the uterus, ovaries, adnexal regions, and pelvic cul-de-sac. COMPARISON:  CT done on 08/19/2021 FINDINGS: Uterus Uterus is not seen consistent with hysterectomy. Right ovary Not sonographically  visualized. Left ovary Not sonographically visualized. Other findings:  No abnormal free fluid. IMPRESSION: Status post hysterectomy. Ovaries are not sonographically visualized. There are no dominant adnexal masses. Terie Purser

## 2021-08-29 NOTE — Progress Notes (Addendum)
Spoke with MD Burr Medico, she would like to reduce the patient's etoposide by 25% due to CrCl being < 50 ml/min. Dose changed to 112.5 mg/m2. Also MD would like the patient's solumedrol/prednisone taper changed to dexamethasone. The following orders have been placed:  ? ?Weeks 1 and 2 - 10 mg/m2 daily = 20 mg dex ?Weeks 3 and 4 - 5 mg/m2 daily= 10 mg ?Weeks 5 and 6 - 2.5 mg/m2 daily = 5 mg ?Week 7 - 1.25 mg/m2 daily = 2.5 mg  ?Week 8 - Taper dose to zero  ? ?Ok to treat w/ hgb, creatinine, and platelet count results. ? ? ?

## 2021-08-29 NOTE — Progress Notes (Signed)
? ?NAME:  Carrie [name], MRN:  [id number], DOB:  1960/07/09, LOS: 11 ?ADMISSION DATE:  08/06/2021, CONSULTATION DATE:  3/17 ?REFERRING MD:  [place], CHIEF COMPLAINT:  Confusion, dyspnea  ? ?History of Present Illness:  ?Patient is a 61 year old female with PMHx as below iniitally admitted for AMS, abdominal pain, constipation, decreased PO intake with dehydration. There were reports that patient experienced similar symptoms when her prednisone was tapered down. She was noted to be pancytopenic for which oncology was consulted. Patient thought to have folic acid deficiency. ADAMTS13 nl. 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 Bone marrow biopsy obtained - hypoproductive without blast or malignant cells noted.  ?Initial CT Head and MRI Brain negative for acute intracranial abnormalities and patient's encephalopathy was thought to be secondary to steroid withdrawal. However, on 3/16, neurology consulted for progressively worsening encephalopathy. Patient recommended for LP and start empiric abx coverage for meningitis.Patient also started on acyclovir for possible viral encephalitis. Recommended for MR imaging of spine with elictable clonus in RUE and BLE.  ?DIC panel obtained on 3/17 with elevated D-dimer and low fibronogen with several small bruises on arms with worsening pancytopenia - patient was given 1u pRBC and cryo.  Experienced 3-min seizure activity after attempted central access placement - required intubation for airway protection.  ?LP performed on 3/18 - CSF non-infectious. Patient transferred to [facility] for LTM EEG monitoring  ? ?Pertinent  Medical History  ?Rheumatoid arthritis ?Dermatomyositis ?Fatty liver ?Hyperlipidemia ?Hypertension ?Rhabdomyolysis ? ?Significant Hospital Events: ?Including procedures, antibiotic start and stop dates in addition to other pertinent events   ?03/2021 labs: ANA positive, Jo-1 neg, centromere neg,  ds DNA neg, RNP positive, SSA/SSB neg, RF positive, chromatin Ab neg, CCP neg ?MRI 3/13-no acute findings ?3/17 bone marrow biopsy ?3/12 CT abdomen and pelvis-no acute findings, adrenal nodule, diverticulosis ?3/12 CT head without contrast-no acute findings ?3/16 EEG-moderate diffuse encephalopathy of nonspecific etiology, no seizures or epileptiform discharges noted during the recording ?3/17 intubation ?3/18 LP performed: no leukocytes, protein elevated, transfer to [facility] for LTM EEG; PICC line placed ?3/20 remains on pulse dose steroids; intermittently tolerating pressure support ?3/21 started on IVIG ? ?Micro ?3/18 csf > neg to date ?3/18 csf fungus > neg  ?3/18 csf vzv > neg ?3/18 csf hsv > neg ?3/18 csf cryptococcal ag > neg ?3/18 csf vdrl > neg ?3/18 csf hsv > neg ?3/18  > lyme disease > negative  ?3/16 blood culture > neg to date ?3/12 blood > staph auricularis 1/4 bottles ?3/12 sars cov 2/flu > negative ?3/16 > parvoB19 IgG pos, IgM neg ?3/16 > EBV IgG pos, IgM neg ?3/16 > CMV IgG pos, IgM neg ?3/16 > HHV6 IgG pos ?3/17 > bone marrow biopsy > hypercellular with trilineage hematopoiesis ? ?Abx ?Rocephin 3/12 ?Acyclovir 3/16>3/19  ?Unasyn 3/19 > ? ?Interim History / Subjective:  ?Patient's mental status remains unchanged. She is on day 3 of IVIG. Remains on full vent support.  ?Given concern for [place], labs sent out yesterday.  ? ?Objective   ?Blood pressure (!) 149/114, pulse 99, temperature 98.2 ?F (36.8 ?C), temperature source Oral, resp. rate 20, height $RemoveBe'5\' 3"'PhLvTHYsO$  (1.6 m), weight 69.2 kg, SpO2 90 %. ?CVP:  [4 mmHg-19 mmHg] 17 mmHg  ?Vent Mode: PRVC ?FiO2 (%):  [40 %-50 %] 50 % ?Set Rate:  [16 bmp] 16 bmp ?Vt Set:  [420 mL] 420 mL ?PEEP:  [8 cmH20] 8 cmH20 ?Pressure  Support:  [8 cmH20] 8 cmH20 ?Plateau Pressure:  [15 cmH20-22 cmH20] 15 cmH20  ? ?Intake/Output Summary (Last 24 hours) at 08/29/2021 0738 ?Last data filed at 08/29/2021 0700 ?Gross per 24 hour  ?Intake 1544.77 ml  ?Output 1745 ml  ?Net -200.23 ml   ? ?Filed Weights  ? 08/27/21 0400 08/28/21 0356 08/29/21 0500  ?Weight: 68.8 kg 70.4 kg 69.2 kg  ? ? ?Examination: ?General:  Critically ill appearing middle aged female; on vent support ?HENT: NCAT ETT in place; disconjugate gaze, PERRL ?PULM: bibasilar decreased breath sounds; on full vent support  ?CV: tachycardic, no mgr, distal pulses intact  ?GI: BS diminished, soft, nontender ?MSK: normal bulk and tone, diffuse anasarca  ?Neuro: pupils reactive; opens eyes to verbal stimuli; withdraws in all extremities to painful stimuli ? ?Resolved Hospital Problem list   ?Circulatory shock  ?Assessment & Plan:  ?Acute metabolic encephalopathy > concern for autoimmune encephalitis  ?High likelihood of [place] ?Patient has been on high dose steroids since admission and has received 5d of pulse dose steroids. She has also received 2 days of IVIG without any significant improvement. CSF studies have been negative. Interdisciplinary discussion for possibility of [place]. 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   ?- Neurology consulted, appreciate recommendations ?- Heme/onc consulted, appreciate assistance ?- Patient will need to start etoposide and dex as soon as possible  ?- CD25, IL-2 and IL-8 pending  ?- Also getting tumor markers for any paraneoplastic syndrome  ?- PAD protocol, fentanyl infusion per protocol, RASS target 0 to -1 ? ?Acute respiratory faiure with hypoxemia  ?Aspiration pneumonia  ?- IV Unasyn for 7 days ?- Continue full vent support ?- VAP bundle  ? ?Circulatory shock likely 2/2 sedation - improved off sedation ?Echo with mild diastolic dysfunction but no other abnormalities noted. Blood culture did have staph auricularis; but this is likely contaminant - does not appear to be in septic shock at this time. She is on high dose steroids;  does not appear to be in adrenal crisis at this time.  ?Has not required further pressor support. Will continue with goal MAP >65 ? ?High likelihood of [place] ?Anemia and thrombocytopenia  ?Previously concern for DIC for which patient received pRBC and cryo. However, labs not consistent with DIC and patient does not have oozing from venipuncture sites. Lab work most consistent with [place] at this time. Patient did have Hb drop to 6.4 this AM for which getting 1u pRBC. Plt stable at 37.  ?- Monitor for bleeding ?- Transfuse PRBC for Hgb < 7 gm/dL, will likely need transfusion tomorrow.  ?- No role for FFP/Cryo right now ?- Transfuse platelets if < 50K and bleeding or if a procedure is needed ? ?Acute renal failure ?Hypokalemia - resolved ?sCr slightly improved this morning. Will hold off diuresis for today.  ?- Monitor urine output ?- Trend renal function and avoid nephrotoxic agents ?- No urgent need for dialysis at this time  ? ?Shock liver ?Appears to be in obstructive pattern. RUQ [place] with gallbladder sludge; mild biliary duct dilatation - recommended for MRCP. Liver enzymes stable/slightly improved this morning. Will hold off MRCP for today as priority would be to start chemo.  ?- MRCP when able ?- Trend LFTs  ? ?Hyperglycemia ?SSI ? ?Best Practice (right click and "Reselect all SmartList Selections" daily)  ? ?Diet/type: tubefeeds ?DVT prophylaxis: SCD ?GI prophylaxis: PPI ?  Lines: Central line and yes and it is still needed ?Foley:  Yes, and it is still needed ?Code Status:  full code ?Last date of multidisciplinary goals of care discussion [family updated] ? ?Labs   ?CBC: ?Recent Labs  ?Lab 08/25/21 ?0454 08/25/21 ?1022 08/26/21 ?0640 08/27/21 ?0419 08/28/21 ?0357 08/29/21 ?0414  ?WBC 4.5  --  8.3 10.2 7.5 6.7  ?NEUTROABS 3.3  --   --   --   --   --   ?HGB 8.9* 8.2* 8.0* 8.7* 7.2* 6.5*  ?HCT 25.6* 24.0* 22.2* 25.2* 21.6* 19.1*  ?MCV 85.3  --  84.4 87.2 88.2 88.8  ?PLT 55*  --  44* 48* 39* 37*  ? ? ?Basic Metabolic  Panel: ?Recent Labs  ?Lab 08/22/21 ?2135 08/23/21 ?0242 08/23/21 ?1611 08/24/21 ?7412 08/24/21 ?1711 08/24/21 ?2127 08/25/21 ?8786 08/25/21 ?1022 08/26/21 ?0640 08/27/21 ?0419 08/28/21 ?0357 08/29/21 ?0414  ?NA

## 2021-08-29 NOTE — Progress Notes (Signed)
?   08/29/21 1730  ?Clinical Encounter Type  ?Visited With Patient and family together  ?Visit Type Initial;Spiritual support  ?Referral From Nurse  ?Consult/Referral To Chaplain  ? ? ?Chaplain responded to a spiritual consult request.  Attempted to engage with patient and I did speak with the family. Family shared Meiah's story particularly her most recent health struggles. We gathered together to pray for strength through the treatments, rest and healing for her body. As time allows I will look in on patient later this evening.  ? ?Valerie Roys ?Chaplain Resident  ?[facility] ?[phone] ? ?

## 2021-08-29 NOTE — Progress Notes (Signed)
Nutrition Follow-up ? ?DOCUMENTATION CODES:  ? ?Not applicable ? ?INTERVENTION:  ? ?Tube feeding via OG tube: ?Increase Vital 1.2 to 70 ml/h (1680 ml per day) ?Prosource TF 45 ml daily ? ?Provides 2056 kcal, 137 gm protein, 1362 ml free water daily ? ?-1 packet Juven BID, each packet provides 95 calories, 2.5 grams of protein (collagen), and 9.8 grams of carbohydrate (3 grams sugar); also contains 7 grams of L-arginine and L-glutamine, 300 mg vitamin C, 15 mg vitamin E, 1.2 mcg vitamin B-12, 9.5 mg zinc, 200 mg calcium, and 1.5 g  Calcium Beta-hydroxy-Beta-methylbutyrate to support wound healing ? ?MVI with minerals daily per tube  ? ? ?NUTRITION DIAGNOSIS:  ? ?Increased nutrient needs related to acute illness, wound healing as evidenced by estimated needs. ?Ongoing.  ? ?GOAL:  ? ?Patient will meet greater than or equal to 90% of their needs ?Met with TF at goal.  ? ?MONITOR:  ? ?Vent status, Labs, Weight trends, I & O's, Skin ? ?REASON FOR ASSESSMENT:  ? ?Consult ?Enteral/tube feeding initiation and management ? ?ASSESSMENT:  ? ?61 y.o. female past medical history of HTN, HLD, depression, anxiety, dermatomyositis, rheumatoid arthritis on Plaquenil, mycophenolate, and daily prednisone, and fatty liver. She presented to the ED due to AMS, abdominal pain, and several days of constipation with significantly decreased PO intake. Her niece reported that prednisone was decreased and then subsequently needed to be increased recently. Her niece reported that patient has had similar symptoms in the past when steroids have been tapered. She was admitted for lactic acidosis. ? ?Pt discussed during ICU rounds and with RN.  ?Hematology and ID following, [place] being considered. + fevers  ?No BM yet, lactulose started  ? ?3/17 intubated ? ?Patient is currently intubated on ventilator support ?MV: 12.6 L/min ?Temp (24hrs), Avg:99.6 ?F (37.6 ?C), Min:97.5 ?F (36.4 ?C), Max:101 ?F (38.3 ?C) ? ?  ?Medications reviewed and include: folic  acid, SSI, lactulose BID (3/24), solumedrol, MVI with minerals, Juven, protonix, senokot-s, thiamine  ?Fentanyl  ?Solumedrol  ? ?Labs reviewed: Albumin <1.5, AST: 121, ALT: 52 ?CRP: 19.4 (3/21) ?B1: 47.4 (3/12) ?B12: 2220 (3/14) ?Ferritin: >7500 ?CBG's: 139-208 ? ?OG tube: tip distal body of stomach ?Current TF: Vital 1.2 @ 65 ml/hr ?Provides: 1872 kcal and 117 grams protein  ? ?Diet Order:   ?Diet Order   ? ?       ?  Diet NPO time specified  Diet effective now       ?  ? ?  ?  ? ?  ? ? ?EDUCATION NEEDS:  ? ?No education needs have been identified at this time ? ?Skin:  Skin Assessment: Skin Integrity Issues: ?Skin Integrity Issues:: Stage III ?Stage III: R hand and sacrum ? ?Last BM:  3/17; type 2 ? ?Height:  ? ?Ht Readings from Last 1 Encounters:  ?08/26/21 $RemoveBe'5\' 3"'[id number]$  (1.6 m)  ? ? ?Weight:  ? ?Wt Readings from Last 1 Encounters:  ?08/29/21 69.2 kg  ? ? ?Ideal Body Weight:  47.7 kg ? ?BMI:  Body mass index is 27.02 kg/m?. ? ?Estimated Nutritional Needs:  ? ?Kcal:  2000-2300 ? ?Protein:  125-140 grams ? ?Fluid:  >/= 2 L/day ? ?Lockie Pares., RD, LDN, CNSC ?See AMiON for contact information  ? ?

## 2021-08-29 NOTE — Progress Notes (Signed)
?  [facility]ious Disease   ? ?Date of Admission:  08/16/2021   Total days of antibiotics 2 acyclovir ? ? ?ID: Carrie [name] is a 61 y.o. female with  hx of RA, dermamyositis, on pred/mmf/plaquenil, admitted for chronic progressive encephalopathy and new onset pancytopenia ? ?Principal Problem: ?  Lactic acidosis ?Active Problems: ?  Hyperlipidemia ?  Hypertension ?  Generalized weakness ?  Rheumatoid arthritis flare (HCC) ?  Depression with anxiety ?  Pancytopenia ([place]) ?  Volume depletion ?  Long-term corticosteroid use ?  Open wound of right hand ?  Adrenal nodule ([place]) ?  Pressure injury of skin ?  High anion gap metabolic acidosis ?  Altered mental status ?  Metabolic encephalopathy ?  Encephalitis ?  Acute encephalopathy ?  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 ? ? ?Subjective: ?Slight improvement in mentation ? ? ? ?Medications:  ? ARIPiprazole  5 mg Per Tube QHS  ? chlorhexidine gluconate (MEDLINE KIT)  15 mL Mouth Rinse BID  ? Chlorhexidine Gluconate Cloth  6 each Topical Q0600  ? escitalopram  20 mg Per Tube Daily  ? feeding supplement (PROSource TF)  45 mL Per Tube Daily  ? folic acid  1 mg Per Tube Daily  ? insulin aspart  0-15 Units Subcutaneous Q4H  ? lactulose  20 g Per Tube BID  ? levETIRAcetam  500 mg Per Tube BID  ? mouth rinse  15 mL Mouth Rinse 10 times per day  ? methocarbamol  500 mg Per Tube QID  ? metoprolol tartrate  12.5 mg Per Tube BID  ? multivitamin with minerals  1 tablet Per Tube Daily  ? mupirocin ointment   Topical BID  ? nutrition supplement (JUVEN)  1 packet Per Tube BID BM  ? pantoprazole sodium  40 mg Per Tube Daily  ? senna-docusate  2 tablet Per Tube QHS  ? sodium chloride flush  10-40 mL Intracatheter Q12H  ? thiamine  100 mg Per Tube Daily  ? ? ?Objective: ?Vital signs in last 24 hours: ?Temp:  [97.5 ?F (36.4 ?C)-101 ?F (38.3 ?C)] 98.1 ?F (36.7 ?C) (03/24 1015) ?Pulse Rate:  [62-119] 81 (03/24 1015) ?Resp:  [14-35] 16 (03/24 1015) ?BP:  (81-172)/(60-121) 84/60 (03/24 1015) ?SpO2:  [89 %-100 %] 96 % (03/24 1015) ?FiO2 (%):  [50 %] 50 % (03/24 1112) ?Weight:  [69.2 kg] 69.2 kg (03/24 0500) ? ?Physical Exam  ?General/constitutional: intubated, ill appearing ?HEENT: Normocephalic, conj clear ?CV: rrr no mrg ?Lungs: clear on vent ?Abd: Soft ?Skin: diffuse echymosis/purpura; right hand stable clean based 1 cm ulcer ?Neuro: comatose/sedated ?MSK: no peripheral joint swelling/tenderness/warmth ? ?Central line presence: RUE picc site no erythema/purulence ? ? ? ? ?Lab Results ?Recent Labs  ?  08/28/21 ?0357 08/29/21 ?0414  ?WBC 7.5 6.7  ?HGB 7.2* 6.5*  ?HCT 21.6* 19.1*  ?NA 139 140  ?K 3.4* 3.7  ?CL 105 109  ?CO2 25 27  ?BUN 43* 58*  ?CREATININE 1.78* 1.52*  ? ?Liver Panel ?Recent Labs  ?  08/28/21 ?0357 08/29/21 ?0414  ?PROT 4.6* 4.8*  ?ALBUMIN 1.5* <1.5*  ?AST 140* 121*  ?ALT 53* 52*  ?ALKPHOS 540* 981*  ?BILITOT 2.3* 1.3*  ? ?Sedimentation Rate ?No results for input(s): ESRSEDRATE in the last 72 hours. ? ?C-Reactive Protein ?No results for input(s): CRP in the last 72 hours. ? ? ?Microbiology: ?reviewed ?Studies/Results: ?US PELVIS (TRANSABDOMINAL ONLY) ? ?Result Date: 08/28/2021 ?CLINICAL DATA:  Evaluate for ovarian mass EXAM: TRANSABDOMINAL ULTRASOUND  OF PELVIS TECHNIQUE: Transabdominal ultrasound examination of the pelvis was performed including evaluation of the uterus, ovaries, adnexal regions, and pelvic cul-de-sac. COMPARISON:  CT done on 08/13/2021 FINDINGS: Uterus Uterus is not seen consistent with hysterectomy. Right ovary Not sonographically visualized. Left ovary Not sonographically visualized. Other findings:  No abnormal free fluid. IMPRESSION: Status post hysterectomy. Ovaries are not sonographically visualized. There are no dominant adnexal masses. There is no free fluid in the pelvis. Electronically Signed   By: Elmer Picker M.D.   On: 08/28/2021 13:01  ? ?US Abdomen Limited RUQ (LIVER/GB) ? ?Result Date: 08/27/2021 ?CLINICAL DATA:   Elevated liver function tests. EXAM: ULTRASOUND ABDOMEN LIMITED RIGHT UPPER QUADRANT COMPARISON:  09/05/2021 CT FINDINGS: Gallbladder: Sludge filled gallbladder. No wall thickening or pericholecystic fluid. Sonographic Percell Miller sign could not be evaluated secondary to patient's clinical status. Common bile duct: Diameter: Mildly dilated for age at 9 mm. 7 mm on the ultrasound of 08/10/2021. Liver: Moderately increased hepatic echogenicity. Portal vein is patent on color Doppler imaging with normal direction of blood flow towards the liver. Other: None. IMPRESSION: Gallbladder sludge, without acute cholecystitis. Development of mild biliary duct dilatation compared to prior ultrasound. Correlate with bilirubin levels. If elevated, consider MRCP. Electronically Signed   By: Abigail Miyamoto M.D.   On: 08/27/2021 16:25   ? ?3/05 ct abd pelv with contrast ?1. No acute localizing process in the abdomen or pelvis. ?2. Cholelithiasis. ?3. Fatty infiltration of the liver. ?4. 2.2 cm left adrenal mass, probable benign adenoma. Recommend ?adrenal washout CT or chemical shift MRI ? ? 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 ? Multiple ID etiologies entertained but csf is not suggestive of active infection and mri brain no new changes to suggest meningitis or parenchymal process ? ?Working dx at this time is an autoimmune process in setting of her RA/dermatomyositis. The autoimmune panel is negative ? ? ?3/24 assessment ?Ferritin >7500; elevated triglyceride/ldh, worsening thrombocytopenia, wbc now trending down as well.  ?No improvement on high dose pulse steroid and IVIG thus far ?Her lft is worsening. 3/22 shows sludge but no obstruction ?3/23 pelvis u/s done to assess for mass which is negative ? 3/17 bone marrow cx  hypercellular marrow ? ?No evidence ID etiology thus far from our extensive testing. ?Had discussed [place] possibility with neurology, icu, and onc team. Plan is to proceed with hlh specific treatment ? ? ?Resulted ID serology: ?CrAg negative ?Cmv igm negative; igg positive ?Acute hep panel negative ?Hiv screen negative ? 3/16 bcx negative ?3/16 cmv igg positive, igm negative ?3/16 ebv panel no acute infection ?3/16 parvovirus igg positive, igm negative ?3/18 csf vdrl, hsv, vzv negative ?3/18 csf cx negative ?3/18 csf ACE level normal range ?3/18 csf lyme serology panel negative ?3/18 urine histo ag negative ?3/18 csf meningitis pcr negative ? ?Csf/serum autoimmune panel negative ? ? ?Pending ID serology: ?Csf fungal cx ?Urine histo ag ?parvovirus pcr ? ? ? ? ? ? ?Plan: ?-agree with primary team/neurology/hem-onc team that given no evidence of infection and progressive encephalopathic picture/cytopenia/hepatitis and features highly suggestive of [place], reasonable to proceed with treatment for it ?-finish 7 day amp-sulbactam on 3/26 ?-discussed with respective teams ? ? ? ? ? ? ? ? ?Jabier Mutton, MD ?[facility] ?[place] ?[phone]  pager   [phone] cell ?08/29/2021, 11:52 AM ? ? ? ? ? ? ?

## 2021-08-29 NOTE — Progress Notes (Signed)
eLink Physician-Brief Progress Note ?Patient Name: Pershing CoxLivia S  ?DOB: 05/20/1961 ?MRN: [id number] ? ? ?Date of Service ? 08/29/2021  ?HPI/Events of Note ? C glabrata, Ok to start mycofungin by RpH. ?On Ig G, pancytopenic. Asp PNA  ?eICU Interventions ? As above.   ? ? ? ?Intervention Category ?Intermediate Interventions: Other: (Culture +) ? ?Ranee GosselinKinila T Demorio Seeley ?08/29/2021, 8:38 PM ?

## 2021-08-29 NOTE — Progress Notes (Signed)
PHARMACY - PHYSICIAN COMMUNICATION ?CRITICAL VALUE ALERT - BLOOD CULTURE IDENTIFICATION (BCID) ? ?Carrie [name] is an 61 y.o. female who presented to [facility] on 09-15-21 with a chief complaint of abdominal pain. ? ?Assessment: C.glabrata growing in 1 of 4 blood culture bottles thus far.  Immunocompromised from cancer treatment. ? ?Name of physician (or Provider) Contacted: Dr. Marlane Mingle ? ?Current antibiotics: Unasyn for aspiration PNA ? ?Changes to prescribed antibiotics recommended:  ?Recommendations accepted by provider > start micafungin ? ? ?Results for orders placed or performed during the hospital encounter of Sep 15, 2021  ?Blood Culture ID Panel (Reflexed) (Collected: 08/28/2021  2:45 PM)  ?Result Value Ref Range  ? Enterococcus faecalis NOT DETECTED NOT DETECTED  ? Enterococcus Faecium NOT DETECTED NOT DETECTED  ? Listeria monocytogenes NOT DETECTED NOT DETECTED  ? Staphylococcus species NOT DETECTED NOT DETECTED  ? Staphylococcus aureus (BCID) NOT DETECTED NOT DETECTED  ? Staphylococcus epidermidis NOT DETECTED NOT DETECTED  ? Staphylococcus lugdunensis NOT DETECTED NOT DETECTED  ? Streptococcus species NOT DETECTED NOT DETECTED  ? Streptococcus agalactiae NOT DETECTED NOT DETECTED  ? Streptococcus pneumoniae NOT DETECTED NOT DETECTED  ? Streptococcus pyogenes NOT DETECTED NOT DETECTED  ? A.calcoaceticus-baumannii NOT DETECTED NOT DETECTED  ? Bacteroides fragilis NOT DETECTED NOT DETECTED  ? Enterobacterales NOT DETECTED NOT DETECTED  ? Enterobacter cloacae complex NOT DETECTED NOT DETECTED  ? Escherichia coli NOT DETECTED NOT DETECTED  ? Klebsiella aerogenes NOT DETECTED NOT DETECTED  ? Klebsiella oxytoca NOT DETECTED NOT DETECTED  ? Klebsiella pneumoniae NOT DETECTED NOT DETECTED  ? Proteus species NOT DETECTED NOT DETECTED  ? Salmonella species NOT DETECTED NOT DETECTED  ? Serratia marcescens NOT DETECTED NOT DETECTED  ? Haemophilus influenzae NOT DETECTED NOT DETECTED  ? Neisseria meningitidis NOT  DETECTED NOT DETECTED  ? Pseudomonas aeruginosa NOT DETECTED NOT DETECTED  ? Stenotrophomonas maltophilia NOT DETECTED NOT DETECTED  ? Candida albicans NOT DETECTED NOT DETECTED  ? Candida auris NOT DETECTED NOT DETECTED  ? Candida glabrata DETECTED (A) NOT DETECTED  ? Candida krusei NOT DETECTED NOT DETECTED  ? Candida parapsilosis NOT DETECTED NOT DETECTED  ? Candida tropicalis NOT DETECTED NOT DETECTED  ? Cryptococcus neoformans/gattii NOT DETECTED NOT DETECTED  ? ? ?Finn Amos D. Laney Potash, PharmD, BCPS, BCCCP ?08/29/2021, 8:38 PM ? ? ?

## 2021-08-29 NOTE — Progress Notes (Addendum)
Subjective: ?Patient examined this morning with fentanyl drip.  More purposeless movements this AM requiring some midazolam. Overall exam is similar to yesterday with no response to voice, no withdrawal to pain, but intact brainstem reflexes.  ? ?Exam: ?Vitals:  ? 08/29/21 0810 08/29/21 0825  ?BP: 113/84 (!) 151/85  ?Pulse: (!) 101 (!) 110  ?Resp: 18 (!) 22  ?Temp: (!) 97.5 ?F (36.4 ?C) 98 ?F (36.7 ?C)  ?SpO2: 97% 94%  ? ?Gen: critically ill female, intubated on Fentanyl gtt ?Resp: non-labored breathing, no acute distress ?Abd: soft, nt ?Skin: Multiple bruising all over body. Generalized edema  ? ?Neuro: ?MS: sedated.  Unresponsive to pain or verbal stimulus ?CN: Pupils equal round and reactive, intact oculocephalic and corneal reflexes ?Motor: does not withdraw to painful stimulus ?Sensory: no response ? ?Pertinent Labs: ?Ammonia from 3/12 was 23 ?TSH 2.47 ?Adamts13 negative ?CSF RBC-one ?CSF WBC-zero ?CSF protein 69 ?CSF glucose 86 ?CSF Gram stain with no organisms ?CSF VDRL-negative ?CSF HSV PCR-negative ?CSF VZV PCR-negative ?CSF crypto antigen-negative ?CSF Lyme PCR-negative ?CSF autoimmune panel-negative ?Serum autoimmune panel-negative ? ?MRI brain 3/13-Motion degraded but unremarkable ? ?CEEG 3/19-3/20:  ?This study is suggestive of severe diffuse encephalopathy, nonspecific etiology. No seizures or epileptiform discharges were seen throughout the recording. ?  ?Impression: 62 year old female with a history of rheumatoid arthritis diagnosed in November as well as subsequent diagnosis of dermatomyositis.  She presented on 3/12 with decreased oral intake, abdominal pain and constipation, and AMS.  Due to her mental status continuing to be poor on 3/16 neurology was consulted.  At that time, she had also become pancytopenic with presumed DIC of unclear etiology.  Given her thrombocytopenia, LP was not immediately attempted, and she was started on acyclovir. ? ?Interestingly, family reported confusion since  November with worsening anytime she had steroids attempted to be weaned.  Given the worsening mental status, new onset seizure without clear etiology, there was consideration of opportunistic infection versus an autoimmune process.  LP without evidence of infection, and pulse dose steroids were started. (Of note, the patient had a 3 minute seizure that occurred after central line placement).  ? 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 ? ?Recommendations: ?1) Etoposide and dexamethasone per primary and heme/onc ?2) D/C IVIg ?3) continue Keppra 500 mg twice daily for previous seizure ? -Will continue until outpatient Neurology follow up ? ?Neurology will continue to follow peripherally.  We will be available for questions.  Please do not hesitate to reach out. ? ? ?Carlyn Reichert, MD ?PGY-1 ?

## 2021-08-29 NOTE — Care Plan (Signed)
?  Interdisciplinary Goals of Care Family Meeting ? ? ?Date carried out:: 08/29/2021 ? ?Location of the meeting: Bedside ? ?Member's involved: Physician, Bedside Registered Nurse, and Family Member or next of kin ? ?Durable Power of Attorney or acting medical decision maker: Dois Davenport [place]/[name] Gaynell Face ? ?Discussion: We discussed goals of care for Carrie [name] .   ?GOALS OF CARE DISCUSSION ?  ?The Clinical status was relayed to patient's sister and patient's niece at bedside in detail. ?  ?Updated and notified of patients medical condition. ?  ?  ?Patient remains unresponsive and will not open eyes to command.   ?Patient is having a weak cough and struggling to remove secretions.   ?Patient with increased WOB and using accessory muscles to breathe ? ?Patient with Progressive multiorgan failure with a very high probablity of a clinically deterioration and death ?  ?  ?Family are satisfied with Plan of action and management. All questions answered ? ?  ?Cheri Fowler MD ?[facility] ?See Amion for pager ?If no response to pager, please call [phone] until 7pm ?After 7pm, Please call E-link [phone] ? ? ?Code status: Full Code ? ?Disposition: Continue current acute care ? ? ?Madylyn Insco ?08/29/2021, 4:23 PM ? ?

## 2021-08-30 ENCOUNTER — Inpatient Hospital Stay (HOSPITAL_COMMUNITY): Payer: Federal, State, Local not specified - PPO

## 2021-08-30 DIAGNOSIS — G9340 Encephalopathy, unspecified: Secondary | ICD-10-CM | POA: Diagnosis not present

## 2021-08-30 DIAGNOSIS — E278 Other specified disorders of adrenal gland: Secondary | ICD-10-CM | POA: Diagnosis not present

## 2021-08-30 DIAGNOSIS — E872 Acidosis, unspecified: Secondary | ICD-10-CM | POA: Diagnosis not present

## 2021-08-30 LAB — GLUCOSE, CAPILLARY
Glucose-Capillary: 257 mg/dL — ABNORMAL HIGH (ref 70–99)
Glucose-Capillary: 247 mg/dL — ABNORMAL HIGH (ref 70–99)
Glucose-Capillary: 262 mg/dL — ABNORMAL HIGH (ref 70–99)
Glucose-Capillary: 262 mg/dL — ABNORMAL HIGH (ref 70–99)
Glucose-Capillary: 187 mg/dL — ABNORMAL HIGH (ref 70–99)
Glucose-Capillary: 151 mg/dL — ABNORMAL HIGH (ref 70–99)

## 2021-08-30 LAB — CBC
HCT: 26 % — ABNORMAL LOW (ref 36.0–46.0)
Hemoglobin: 8.7 g/dL — ABNORMAL LOW (ref 12.0–15.0)
MCH: 30.4 pg (ref 26.0–34.0)
MCHC: 33.5 g/dL (ref 30.0–36.0)
MCV: 90.9 fL (ref 80.0–100.0)
Platelets: 38 10*3/uL — ABNORMAL LOW (ref 150–400)
RBC: 2.86 MIL/uL — ABNORMAL LOW (ref 3.87–5.11)
RDW: 18.7 % — ABNORMAL HIGH (ref 11.5–15.5)
WBC: 6 10*3/uL (ref 4.0–10.5)
nRBC: 3 % — ABNORMAL HIGH (ref 0.0–0.2)

## 2021-08-30 LAB — FIBRINOGEN: Fibrinogen: 62 mg/dL — CL (ref 210–475)

## 2021-08-30 LAB — COMPREHENSIVE METABOLIC PANEL
ALT: 59 U/L — ABNORMAL HIGH (ref 0–44)
AST: 130 U/L — ABNORMAL HIGH (ref 15–41)
Albumin: 1.5 g/dL — ABNORMAL LOW (ref 3.5–5.0)
Alkaline Phosphatase: 744 U/L — ABNORMAL HIGH (ref 38–126)
Anion gap: 5 (ref 5–15)
BUN: 62 mg/dL — ABNORMAL HIGH (ref 8–23)
CO2: 27 mmol/L (ref 22–32)
Calcium: 7.8 mg/dL — ABNORMAL LOW (ref 8.9–10.3)
Chloride: 109 mmol/L (ref 98–111)
Creatinine, Ser: 1.5 mg/dL — ABNORMAL HIGH (ref 0.44–1.00)
GFR, Estimated: 39 mL/min — ABNORMAL LOW (ref >60)
Glucose, Bld: 262 mg/dL — ABNORMAL HIGH (ref 70–99)
Potassium: 4.4 mmol/L (ref 3.5–5.1)
Sodium: 141 mmol/L (ref 135–145)
Total Bilirubin: 1.6 mg/dL — ABNORMAL HIGH (ref 0.3–1.2)
Total Protein: 5 g/dL — ABNORMAL LOW (ref 6.5–8.1)

## 2021-08-30 LAB — AFP TUMOR MARKER: AFP, Serum, Tumor Marker: 2.7 ng/mL (ref 0.0–9.2)

## 2021-08-30 LAB — FERRITIN: Ferritin: >7500 ng/mL — ABNORMAL HIGH (ref 11–307)

## 2021-08-30 LAB — CEA: CEA: 36.4 ng/mL — ABNORMAL HIGH (ref 0.0–4.7)

## 2021-08-30 IMAGING — DX DG CHEST 1V PORT
1 series · 1 of 1 positions shown · non-contrast
Comparison: Radiograph [DATE]

CLINICAL DATA: Acute respiratory failure with hypoxemia

EXAM:
PORTABLE CHEST 1 VIEW

[chest]
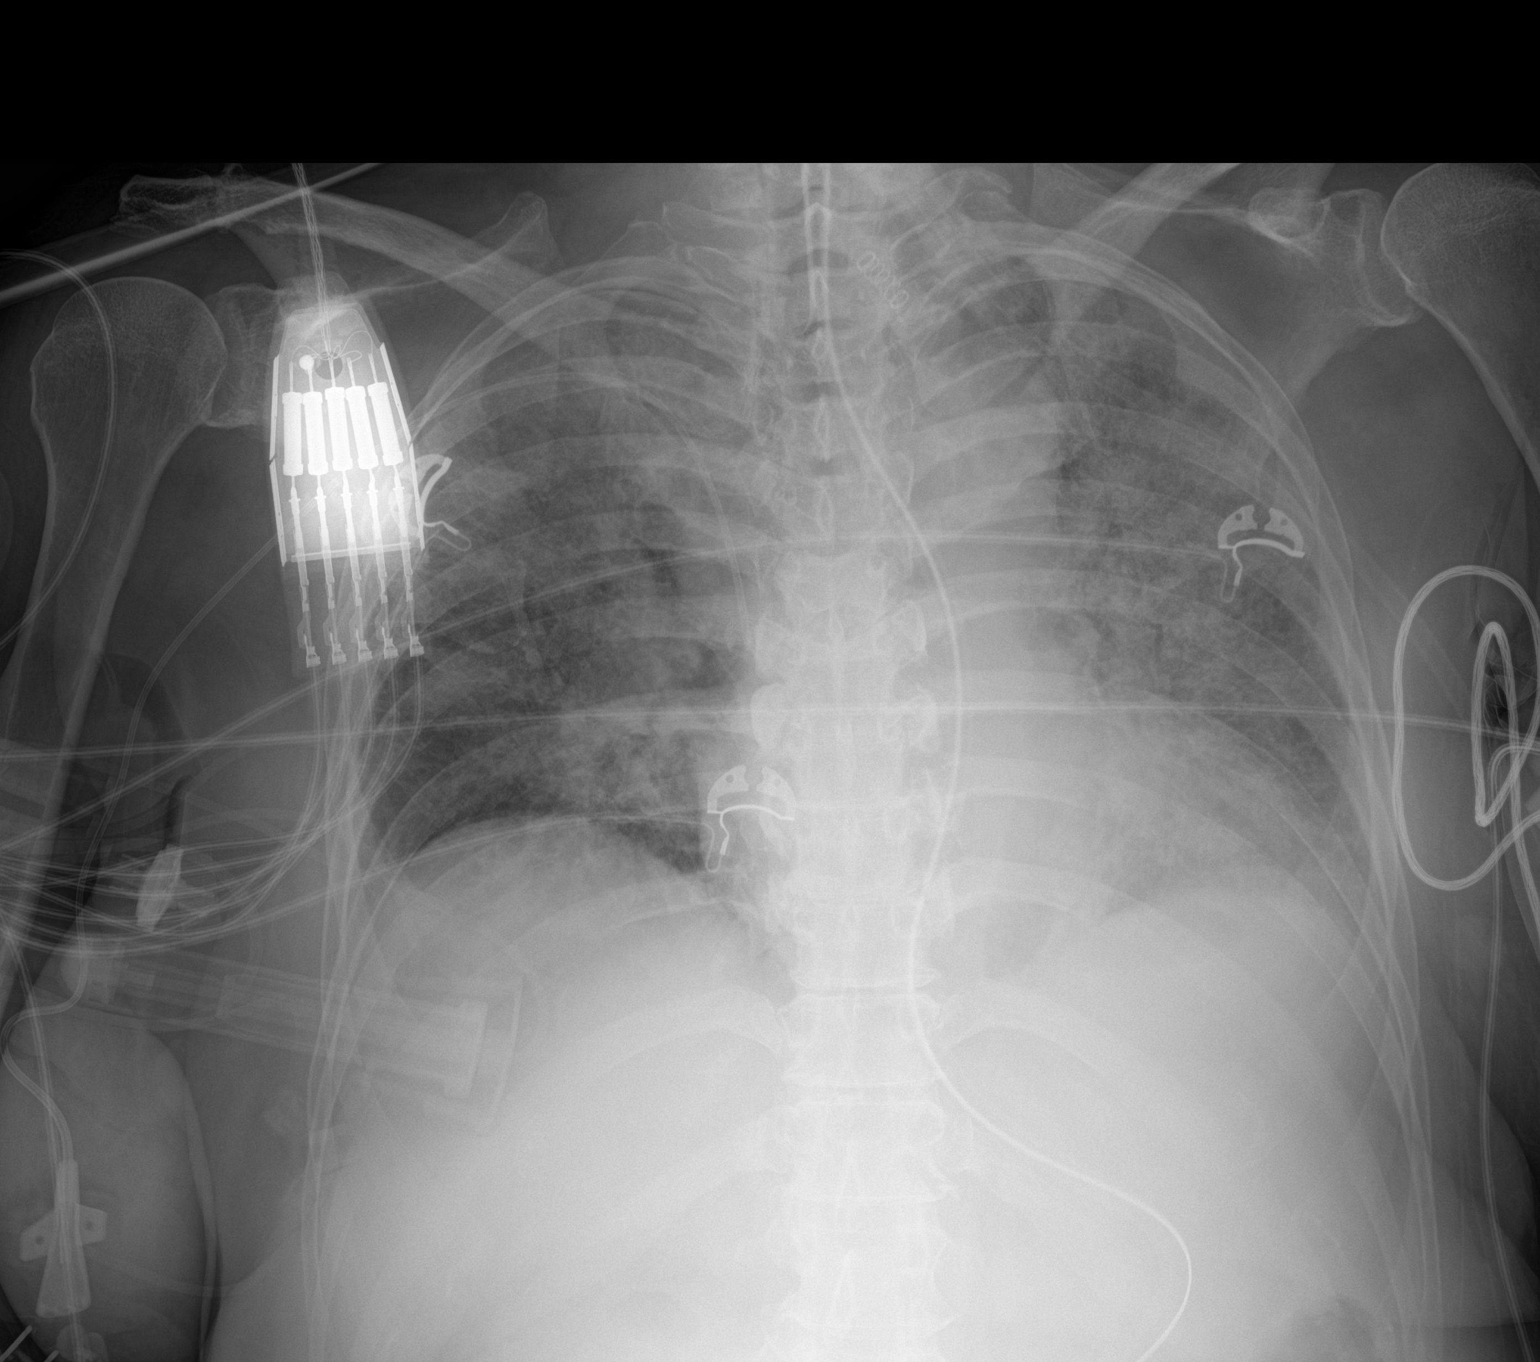

[1 of 1 positions shown; findings below may reference images not displayed]

FINDINGS: Right upper extremity PICC tip overlies the mid superior vena cava.
Endotracheal tube overlies the upper trachea near the thoracic
inlet. This has been retracted since the prior exam. Orogastric tube
passes below the diaphragm, tip excluded by collimation. Unchanged
cardiomediastinal silhouette. There is diffuse airspace disease,
increased throughout the left lung in comparison to prior exam.
Pleural trace effusions. No visible pneumothorax. There is no acute
osseous abnormality.
IMPRESSION: Endotracheal tube tip has been retracted since the prior exam, tip
near the thoracic inlet.

Diffuse airspace disease, worsening throughout the left lung in
comparison to prior exam.

## 2021-08-30 MED ORDER — FUROSEMIDE 10 MG/ML IJ SOLN
40.0000 mg | Freq: Once | INTRAMUSCULAR | Status: AC
  Administered 2021-08-30: 40 mg via INTRAVENOUS
  Filled 2021-08-30: qty 4

## 2021-08-30 MED ORDER — INSULIN ASPART 100 UNIT/ML IJ SOLN
5.0000 [IU] | INTRAMUSCULAR | Status: DC
  Administered 2021-08-30 – 2021-09-01 (×11): 5 [IU] via SUBCUTANEOUS

## 2021-08-30 MED ORDER — SODIUM CHLORIDE 0.9% IV SOLUTION: Freq: Once | INTRAVENOUS | Status: DC

## 2021-08-30 MED ORDER — IPRATROPIUM-ALBUTEROL 0.5-2.5 (3) MG/3ML IN SOLN
3.0000 mL | RESPIRATORY_TRACT | Status: DC | PRN
  Administered 2021-09-21: 3 mL via RESPIRATORY_TRACT
  Filled 2021-08-30 (×2): qty 3

## 2021-08-30 NOTE — Progress Notes (Signed)
HEMATOLOGY-ONCOLOGY PROGRESS NOTE ? ?ASSESSMENT AND PLAN: ?61 yo female  ?  ?Acute encephalopathy, ? 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  ?Recommendations: ?-- covering for Dr Burr Medico on the weekend. ?-- Patient is intubated and sedated, discussed with nursing team, they dont think there is a significant change in status since yesterday. ?-Labs from today have been reviewed.  WBC remains normal , hemoglobin and platelets stable since yesterday ?- Dr Burr Medico had extensive discussions with the patient's family as well as other members of the healthcare team who all agree features are most consistent with [place].  ?--She received treatment with etoposide and dex 08/29/2021, treatment planned for 3/27, D4 ?-Adverse effects of this treatment have been discussed with the patient's family and consent has been obtained from them.  Plan to administer dexamethasone 20 mg IV daily for 2 weeks and administer etoposide twice a week for 2 weeks.  Okay to proceed with chemotherapy despite counts today. ?-Recommend supportive care with PRBC transfusion for hemoglobin less than 7 or platelet transfusion for platelet count less than 20,000 or active bleeding. ?-Recommend cryo for fibrinogen less than 100. ? ? ?SUBJECTIVE: Remains on the ventilator.  No bleeding reported ?Discussed with nursing team ?They say she is more agitated, RR 45/min ?Now on maximum dose of precedex ? ?REVIEW OF SYSTEMS:   ?Review of Systems  ?Reason unable to perform ROS: Sedated, on ventilator.  ? ?PHYSICAL EXAMINATION: ?ECOG PERFORMANCE STATUS: 4 - Bedbound ? ?Vitals:  ? 08/30/21 1150 08/30/21 1200  ?BP: (!) 144/86   ?Pulse: 66   ?Resp: 20    ?Temp:  97.7 ?F (36.5 ?C)  ?SpO2:    ? ?Filed Weights  ? 08/28/21 0356 08/29/21 0500 08/30/21 0500  ?Weight: 155 lb 3.3 oz (70.4 kg) 152 lb 8.9 oz (69.2 kg) 154 lb 5.2 oz (70 kg)  ? ? ?Intake/Output from previous day: ?03/24 0701 - 03/25 0700 ?In: 2824.9 [I.V.:639.5; NG/GT:1191.3; IV Piggyback:994.1] ?Out: 1215 [Urine:1215] ? ?Physical Exam ?Vitals reviewed.  ?Constitutional:   ?   Comments: Sedated, on ventilator  ?HENT:  ?   Head: Normocephalic.  ?Musculoskeletal:  ?   Right lower leg: Edema present.  ?   Left lower leg: Edema present.  ?Skin: ?   General: Skin is warm and dry.  ?Neurological:  ?   Comments: Sedated.  ? ? ?LABORATORY DATA:  ?I have reviewed the data as listed ? ?  Latest Ref Rng & Units 08/30/2021  ?  5:56 AM 08/29/2021  ?  4:14 AM 08/28/2021  ?  3:57 AM  ?CMP  ?Glucose 70 - 99 mg/dL 262   139   185    ?BUN 8 - 23 mg/dL 62   58   43    ?Creatinine 0.44 - 1.00 mg/dL 1.50   1.52   1.78    ?Sodium 135 - 145 mmol/L 141   140   139    ?Potassium 3.5 - 5.1 mmol/L 4.4   3.7   3.4    ?Chloride 98 - 111 mmol/L 109   109   105    ?CO2 22 - 32 mmol/L 27   27   25     ?Calcium 8.9 - 10.3 mg/dL 7.8   7.4   7.4    ?Total  Protein 6.5 - 8.1 g/dL 5.0   4.8   4.6    ?Total Bilirubin 0.3 - 1.2 mg/dL 1.6   1.3   2.3    ?Alkaline Phos 38 - 126 U/L 744   661   894    ?AST 15 - 41 U/L 130   121   140    ?ALT 0 - 44 U/L 59   52   53    ? ? ?Lab Results  ?Component Value Date  ? WBC 6.0 08/30/2021  ? HGB 8.7 (L) 08/30/2021  ? HCT 26.0 (L) 08/30/2021  ? MCV 90.9 08/30/2021  ? PLT 38 (L) 08/30/2021  ? NEUTROABS 3.3 08/25/2021  ? ? ?Lab Results  ?Component Value Date  ? CEA1 36.4 (H) 08/28/2021  ? ? ?DG Chest 1 View ? ?Result Date: 08/06/2021 ?CLINICAL DATA:  Dizziness, nausea and vomiting, bilateral leg pain EXAM: CHEST  1 VIEW COMPARISON:  04/16/2021 FINDINGS: Single frontal view of the chest demonstrates an unremarkable cardiac silhouette. No acute airspace disease, effusion, or pneumothorax. No acute bony abnormalities.  IMPRESSION: 1. No acute intrathoracic process. Electronically Signed   By: Randa Ngo M.D.   On: 08/06/2021 19:41  ? ?CT HEAD WO CONTRAST (5MM) ? ?Result Date: 08/26/2021 ?CLINICAL DATA:  61 year old female with history of delirium. Lower abdominal pain and constipation. EXAM: CT HEAD WITHOUT CONTRAST TECHNIQUE: Contiguous axial images were obtained from the base of the skull through the vertex without intravenous contrast. RADIATION DOSE REDUCTION: This exam was performed according to the departmental dose-optimization program which includes automated exposure control, adjustment of the mA and/or kV according to patient size and/or use of iterative reconstruction technique. COMPARISON:  Head CT 08/06/2021. FINDINGS: Brain: Mild cerebral atrophy. Patchy and confluent areas of decreased attenuation are noted throughout the deep and periventricular white matter of the cerebral hemispheres bilaterally, compatible with chronic microvascular ischemic disease. No evidence of acute infarction, hemorrhage, hydrocephalus, extra-axial collection or mass lesion/mass effect. Vascular: No hyperdense vessel or unexpected calcification. Skull: Normal. Negative for fracture or focal lesion. Sinuses/Orbits: No acute finding. Other: None. IMPRESSION: 1. No acute intracranial abnormalities. 2. Mild cerebral atrophy with chronic microvascular ischemic changes in the cerebral white matter, as above. Electronically Signed   By: Vinnie Langton M.D.   On: 08/09/2021 06:31  ? ?CT Head Wo Contrast ? ?Result Date: 08/06/2021 ?CLINICAL DATA:  Dizziness, nausea and vomiting EXAM: CT HEAD WITHOUT CONTRAST TECHNIQUE: Contiguous axial images were obtained from the base of the skull through the vertex without intravenous contrast. RADIATION DOSE REDUCTION: This exam was performed according to the departmental dose-optimization program which includes automated exposure control, adjustment of the mA and/or kV according to patient size and/or use  of iterative reconstruction technique. COMPARISON:  03/26/2021, 04/17/2021 FINDINGS: Brain: No acute infarct or hemorrhage. Lateral ventricles and midline structures are stable. No acute extra-axial fluid collections. No mass effect. Vascular: No hyperdense vessel or unexpected calcification. Skull: Normal. Negative for fracture or focal lesion. Sinuses/Orbits: No acute finding. Other: None. IMPRESSION: 1. No acute intracranial process. Electronically Signed   By: Randa Ngo M.D.   On: 08/06/2021 19:40  ? ?MR BRAIN WO CONTRAST ? ?Result Date: 08/18/2021 ?CLINICAL DATA:  Delirium.  Unable to follow commands EXAM: MRI HEAD WITHOUT CONTRAST TECHNIQUE: Multiplanar, multiecho pulse sequences of the brain and surrounding structures were obtained without intravenous contrast. COMPARISON:  Head CT from yesterday 04/17/2021 brain MRI FINDINGS: Brain: No acute infarction, hemorrhage, hydrocephalus, extra-axial collection or mass lesion. Cerebral volume  loss without specific pattern. Vascular: Normal flow voids. Skull and upper cervical spine: Normal marrow signal. Sinuses/Orbits: Bilateral cataract resection. Minor mastoid opacification on the left more than right. Negative nasopharynx. Other: Motion degraded study requiring fast brain protocol. IMPRESSION: Motion degraded brain MRI without acute finding. No change since November 2022. Electronically Signed   By: Jorje Guild M.D.   On: 08/18/2021 11:46  ? ?US PELVIS (TRANSABDOMINAL ONLY) ? ?Result Date: 08/28/2021 ?CLINICAL DATA:  Evaluate for ovarian mass EXAM: TRANSABDOMINAL ULTRASOUND OF PELVIS TECHNIQUE: Transabdominal ultrasound examination of the pelvis was performed including evaluation of the uterus, ovaries, adnexal regions, and pelvic cul-de-sac. COMPARISON:  CT done on 09/04/2021 FINDINGS: Uterus Uterus is not seen consistent with hysterectomy. Right ovary Not sonographically visualized. Left ovary Not sonographically visualized. Other findings:  No abnormal  free fluid. IMPRESSION: Status post hysterectomy. Ovaries are not sonographically visualized. There are no dominant adnexal masses. There is no free fluid in the pelvis. Electronically Signed   By: Joline Maxcy

## 2021-08-30 NOTE — Progress Notes (Addendum)
[facility] Infectious Diseases Follow Up Note  Patient Identification: Patient Name: Carrie [name] MRN: [id number] Admit Date: 08-26-21  4:11 AM Age: 61 y.o.Today's Date: 08/30/2021   Reason for Visit: candidemia   Principal Problem:   Lactic acidosis Active Problems:   Hyperlipidemia   Hypertension   Generalized weakness   Rheumatoid arthritis flare (HCC)   Depression with anxiety   Pancytopenia (HCC)   Volume depletion   Long-term corticosteroid use   Open wound of right hand   Adrenal nodule (HCC)   Pressure injury of skin   High anion gap metabolic acidosis   Altered mental status   Metabolic encephalopathy   Encephalitis   Acute encephalopathy   HLH (hemophagocytic lymphohistiocytosis) (HCC)   Antibiotics:  Acyclovir 3/16-3/18 Amp sulbactam 3/20-c Micafungin 3/24-c  Lines/Hardwares: PICC rt arm +  Interval Events: afebrile in the last 24 hrs, Blood cx 3/23 + for candida glabrata   Assessment Acute encephalopathy with negative infective work up so far and concerns for autoimmune etiology as well as HLH with CNS involvement -    s/p pulse dose steroids  and IVIG -     On dexamethasone and etoposide. Oncology following   Acute respiratory failure 2/2 above/concerns for aspiration PNA Candida glabrata fungemia - in the setting of rt arm PICC placed on  3/18, possibly the source. TTE 3/19 with no concerns for vegetation or endocarditis  AKI/Shock liver Thrombocytopenia/Normocytic anemia/DIC RA/dermatomyositis  on MMF/HCQ and prednisone prior to admit/immunocompromised   Recommendations ( covering for Dr Renold Don) Continue Micafungin as is Complete 7 days of Unasyn as recommended  Repeat TTE given new candidemia Repeat 2 sets of blood cultures today  PICC line to  be removed when able given likely source,  Unable to assess eye exam currently  Oncology managing for possible HLH Monitor CBC , CMP  Fu pending  ID labs ( urine histoplasma negative, CSF fungal cultures( AFB cx not sent), Parvo virus PCR) D/w ICU team   Rest of the management as per the primary team. Thank you for the consult. Please page with pertinent questions or concerns.  ______________________________________________________________________ Subjective patient seen and examined at the bedside. Intubated and sedated   Vitals BP 126/86   Pulse 71   Temp 97.6 F (36.4 C) (Oral)   Resp 18   Ht 5\' 3"  (1.6 m)   Wt 70 kg   SpO2 96%   BMI 27.34 kg/m     Physical Exam Constitutional: orotracheally intubated and sedated      Comments: on precedex and fentanyl  Cardiovascular:     Rate and Rhythm: Normal rate and regular rhythm.     Heart sounds:   Pulmonary:     Effort: Pulmonary effort is normal. On vent     Comments:   Abdominal:     Palpations: Abdomen is soft.     Tenderness: non distended   Musculoskeletal:        General: No swelling in peripheral joints  Skin:    Comments: ecchymosis +  Neurological:     General: unable to assess  Pertinent Microbiology Results for orders placed or performed during the hospital encounter of 08-26-2021  Culture, blood (single)     Status: Abnormal   Collection Time: 26-Aug-2021  5:15 AM   Specimen: BLOOD  Result Value Ref Range Status   Specimen Description   Final    BLOOD LEFT ANTECUBITAL Performed at [facility], [address] [place], [place] [address]  Special Requests   Final    BOTTLES DRAWN AEROBIC AND ANAEROBIC Blood Culture adequate volume Performed at [facility], [address] [place], [place] [address]    Culture  Setup Time   Final    GRAM POSITIVE COCCI IN CLUSTERS AEROBIC BOTTLE ONLY CRITICAL RESULT CALLED TO, READ BACK BY AND VERIFIED WITH: PHARMD A. ELLINGTON [phone] [id number] FCP    Culture (A)  Final    STAPHYLOCOCCUS AURICULARIS THE SIGNIFICANCE OF ISOLATING THIS ORGANISM FROM A SINGLE SET OF BLOOD  CULTURES WHEN MULTIPLE SETS ARE DRAWN IS UNCERTAIN. PLEASE NOTIFY THE MICROBIOLOGY DEPARTMENT WITHIN ONE WEEK IF SPECIATION AND SENSITIVITIES ARE REQUIRED. Performed at [facility], [address] [place], [place] [address]    Report Status 08/20/2021 FINAL  Final  Blood Culture ID Panel (Reflexed)     Status: Abnormal   Collection Time: 08/17/21  5:15 AM  Result Value Ref Range Status   Enterococcus faecalis NOT DETECTED NOT DETECTED Final   Enterococcus Faecium NOT DETECTED NOT DETECTED Final   Listeria monocytogenes NOT DETECTED NOT DETECTED Final   Staphylococcus species DETECTED (A) NOT DETECTED Final    Comment: CRITICAL RESULT CALLED TO, READ BACK BY AND VERIFIED WITH: PHARMD A. ELLINGTON [phone] [id number] FCP    Staphylococcus aureus (BCID) NOT DETECTED NOT DETECTED Final   Staphylococcus epidermidis NOT DETECTED NOT DETECTED Final   Staphylococcus lugdunensis NOT DETECTED NOT DETECTED Final   Streptococcus species NOT DETECTED NOT DETECTED Final   Streptococcus agalactiae NOT DETECTED NOT DETECTED Final   Streptococcus pneumoniae NOT DETECTED NOT DETECTED Final   Streptococcus pyogenes NOT DETECTED NOT DETECTED Final   A.calcoaceticus-baumannii NOT DETECTED NOT DETECTED Final   Bacteroides fragilis NOT DETECTED NOT DETECTED Final   Enterobacterales NOT DETECTED NOT DETECTED Final   Enterobacter cloacae complex NOT DETECTED NOT DETECTED Final   Escherichia coli NOT DETECTED NOT DETECTED Final   Klebsiella aerogenes NOT DETECTED NOT DETECTED Final   Klebsiella oxytoca NOT DETECTED NOT DETECTED Final   Klebsiella pneumoniae NOT DETECTED NOT DETECTED Final   Proteus species NOT DETECTED NOT DETECTED Final   Salmonella species NOT DETECTED NOT DETECTED Final   Serratia marcescens NOT DETECTED NOT DETECTED Final   Haemophilus influenzae NOT DETECTED NOT DETECTED Final   Neisseria meningitidis NOT DETECTED NOT DETECTED Final   Pseudomonas aeruginosa NOT DETECTED NOT DETECTED  Final   Stenotrophomonas maltophilia NOT DETECTED NOT DETECTED Final   Candida albicans NOT DETECTED NOT DETECTED Final   Candida auris NOT DETECTED NOT DETECTED Final   Candida glabrata NOT DETECTED NOT DETECTED Final   Candida krusei NOT DETECTED NOT DETECTED Final   Candida parapsilosis NOT DETECTED NOT DETECTED Final   Candida tropicalis NOT DETECTED NOT DETECTED Final   Cryptococcus neoformans/gattii NOT DETECTED NOT DETECTED Final    Comment: Performed at [facility], [address] [place], [place] [address]  Resp Panel by RT-PCR (Flu A&B, Covid) Nasopharyngeal Swab     Status: None   Collection Time: 08/17/21  6:50 AM   Specimen: Nasopharyngeal Swab; Nasopharyngeal(NP) swabs in vial transport medium  Result Value Ref Range Status   SARS Coronavirus 2 by RT PCR NEGATIVE NEGATIVE Final    Comment: (NOTE) SARS-CoV-2 target nucleic acids are NOT DETECTED.  The SARS-CoV-2 RNA is generally detectable in upper respiratory specimens during the acute phase of infection. The lowest concentration of SARS-CoV-2 viral copies this assay can detect is 138 copies/mL. A negative result does not preclude SARS-Cov-2  infection and should not be used as the sole basis for treatment or other patient management decisions. A negative result may occur with  improper specimen collection/handling, submission of specimen other than nasopharyngeal swab, presence of viral mutation(s) within the areas targeted by this assay, and inadequate number of viral copies(<138 copies/mL). A negative result must be combined with clinical observations, patient history, and epidemiological information. The expected result is Negative.  Fact Sheet for Patients:  BloggerCourse.com  Fact Sheet for Healthcare Providers:  SeriousBroker.it  This test is no t yet approved or cleared by the [place] FDA and  has been authorized for detection and/or diagnosis of  SARS-CoV-2 by FDA under an Emergency Use Authorization (EUA). This EUA will remain  in effect (meaning this test can be used) for the duration of the COVID-19 declaration under Section 564(b)(1) of the Act, 21 U.S.C.section 360bbb-3(b)(1), unless the authorization is terminated  or revoked sooner.       Influenza A by PCR NEGATIVE NEGATIVE Final   Influenza B by PCR NEGATIVE NEGATIVE Final    Comment: (NOTE) The Xpert Xpress SARS-CoV-2/FLU/RSV plus assay is intended as an aid in the diagnosis of influenza from Nasopharyngeal swab specimens and should not be used as a sole basis for treatment. Nasal washings and aspirates are unacceptable for Xpert Xpress SARS-CoV-2/FLU/RSV testing.  Fact Sheet for Patients: BloggerCourse.com  Fact Sheet for Healthcare Providers: SeriousBroker.it  This test is not yet approved or cleared by the [place] FDA and has been authorized for detection and/or diagnosis of SARS-CoV-2 by FDA under an Emergency Use Authorization (EUA). This EUA will remain in effect (meaning this test can be used) for the duration of the COVID-19 declaration under Section 564(b)(1) of the Act, 21 U.S.C. section 360bbb-3(b)(1), unless the authorization is terminated or revoked.  Performed at [facility], [address] [place], [place] [address]   Culture, blood (single)     Status: None   Collection Time: 08-22-2021 11:49 AM   Specimen: BLOOD  Result Value Ref Range Status   Specimen Description   Final    BLOOD RIGHT ANTECUBITAL Performed at [facility], [address] [place], [place] [address]    Special Requests   Final    BOTTLES DRAWN AEROBIC ONLY Blood Culture adequate volume Performed at [facility], [address], [place], [place] [address]    Culture   Final    NO GROWTH 5 DAYS Performed at [facility], [address],  [place], [place] [address]    Report Status 08/22/2021 FINAL  Final  MRSA Next Gen by PCR, Nasal     Status: None   Collection Time: 08/20/21  3:02 PM   Specimen: Nasal Mucosa; Nasal Swab  Result Value Ref Range Status   MRSA by PCR Next Gen NOT DETECTED NOT DETECTED Final    Comment: (NOTE) The GeneXpert MRSA Assay (FDA approved for NASAL specimens only), is one component of a comprehensive MRSA colonization surveillance program. It is not intended to diagnose MRSA infection nor to guide or monitor treatment for MRSA infections. Test performance is not FDA approved in patients less than 87 years old. Performed at [facility], [address] [place], [place] [address]   Culture, blood (routine x 2)     Status: None   Collection Time: 08/21/21  8:36 AM   Specimen: BLOOD  Result Value Ref Range Status   Specimen Description   Final  BLOOD LEFT ANTECUBITAL Performed at [facility], [address], [place], [place] [address]    Special Requests   Final    BOTTLES DRAWN AEROBIC ONLY Blood Culture adequate volume Performed at [facility], [address] [place], [place] [address]    Culture   Final    NO GROWTH 5 DAYS Performed at [facility], [address], [place], [place] [address]    Report Status 08/26/2021 FINAL  Final  Culture, blood (routine x 2)     Status: None   Collection Time: 08/21/21  8:37 AM   Specimen: BLOOD  Result Value Ref Range Status   Specimen Description   Final    BLOOD BLOOD RIGHT HAND Performed at [facility], [address], [place], [place] [address]    Special Requests   Final    BOTTLES DRAWN AEROBIC ONLY Blood Culture adequate volume Performed at [facility], [address], [place], [place] [address]    Culture   Final    NO GROWTH 5 DAYS Performed at [facility], [address] [place], [place] [address]    Report Status  08/26/2021 FINAL  Final  CSF culture w Gram Stain     Status: None   Collection Time: 08/23/21 11:02 AM   Specimen: CSF; Cerebrospinal Fluid  Result Value Ref Range Status   Specimen Description   Final    CSF Performed at [facility], [address], [place], [place] [address]    Special Requests   Final    Immunocompromised Performed at [facility], [address], [place], [place] [address]    Gram Stain   Final    NO WBC SEEN NO ORGANISMS SEEN CYTOSPIN SMEAR Gram Stain Report Called to,Read Back By and Verified With: [facility] RN ON 08/23/21 @ 1205 BY GOLSONM Performed at [facility] [address], [place], [place] [address]    Culture   Final    NO GROWTH 3 DAYS Performed at [facility], [address] [place], [place] [address]    Report Status 08/26/2021 FINAL  Final  VZV PCR, CSF     Status: None   Collection Time: 08/23/21 11:05 AM  Result Value Ref Range Status   VZV PCR, CSF Negative Negative Final    Comment: (NOTE) No Varicella Zoster Virus DNA detected. Performed At: [facility] [address] [place], [place] [id number] Jolene Schimke MD KV:[id number]   Fungus Culture With Stain     Status: None (Preliminary result)   Collection Time: 08/23/21  1:30 PM  Result Value Ref Range Status   Fungus Stain Final report  Final    Comment: (NOTE) Performed At: [facility] [address] [place], [place] [id number] Jolene Schimke MD AC:[id number]    Fungus (Mycology) Culture PENDING  Incomplete   Fungal Source CSF  Final    Comment: Performed at [facility], [address] [place], [place] [address]  Fungus Culture Result     Status: None   Collection Time: 08/23/21  1:30 PM  Result Value Ref Range Status   Result 1 Comment  Final    Comment: (NOTE) KOH/Calcofluor preparation:  no fungus observed. Performed At: [facility] [address]  [place], [place] [id number] Jolene Schimke MD RK:[id number]   Fungus Culture With Stain     Status: None (Preliminary result)   Collection Time: 08/23/21  3:57 PM  Result Value Ref Range Status   Fungus Stain Final report  Final    Comment: (NOTE) Performed At: [facility] [address] [place], [place] [id number] Jolene Schimke MD NW:[id number]    Fungus (Mycology) Culture PENDING  Incomplete   Fungal Source CSF  Final    Comment: Performed at [facility], [address] [place], [place] [address]  Fungus Culture Result     Status: None   Collection Time: 08/23/21  3:57 PM  Result Value Ref Range Status   Result 1 Comment  Final    Comment: (NOTE) KOH/Calcofluor preparation:  no fungus observed. Performed At: [facility] [address] [place], [place] [id number] Jolene Schimke MD LK:[id number]   Blastomyces Antigen     Status: None   Collection Time: 08/23/21  4:40 PM   Specimen: Blood  Result Value Ref Range Status   Blastomyces Antigen None Detected None Detected ng/mL Final    Comment: (NOTE) Results reported as ng/mL in 0.2 - 14.7 ng/mL range Results above the limit of detection but below 0.2 ng/mL are reported as 'Positive, Below the Limit of Quantification' Results above 14.7 ng/mL are reported as 'Positive, Above the Limit of Quantification'    Specimen Type SERUM  Final    Comment: (NOTE) Performed At: [facility] [address] [place], [place] [id number] Phylis Bougie MD QV:[id number]   Culture, blood (routine x 2)     Status: None (Preliminary result)   Collection Time: 08/28/21  2:37 PM   Specimen: BLOOD  Result Value Ref Range Status   Specimen Description BLOOD BLOOD LEFT HAND  Final   Special Requests   Final    BOTTLES DRAWN AEROBIC AND ANAEROBIC Blood Culture adequate volume   Culture   Final    NO GROWTH 2 DAYS Performed at [facility], [address], [place], [place] [address]    Report  Status PENDING  Incomplete  Culture, blood (routine x 2)     Status: Abnormal (Preliminary result)   Collection Time: 08/28/21  2:45 PM   Specimen: BLOOD  Result Value Ref Range Status   Specimen Description BLOOD BLOOD LEFT HAND  Final   Special Requests   Final    BOTTLES DRAWN AEROBIC AND ANAEROBIC Blood Culture adequate volume   Culture  Setup Time   Final    BUDDING YEAST SEEN ANAEROBIC BOTTLE ONLY CRITICAL RESULT CALLED TO, READ BACK BY AND VERIFIED WITH: PHARMD T. DANG [id number]@2018  FH    Culture (A)  Final    CANDIDA GLABRATA CULTURE REINCUBATED FOR BETTER GROWTH Performed at [facility], [address], [place], [place] [address]    Report Status PENDING  Incomplete  Blood Culture ID Panel (Reflexed)     Status: Abnormal   Collection Time: 08/28/21  2:45 PM  Result Value Ref Range Status   Enterococcus faecalis NOT DETECTED NOT DETECTED Final   Enterococcus Faecium NOT DETECTED NOT DETECTED Final   Listeria monocytogenes NOT DETECTED NOT DETECTED Final   Staphylococcus species NOT DETECTED NOT DETECTED Final   Staphylococcus aureus (BCID) NOT DETECTED NOT DETECTED Final   Staphylococcus epidermidis NOT DETECTED NOT DETECTED Final   Staphylococcus lugdunensis NOT DETECTED NOT DETECTED Final   Streptococcus species NOT DETECTED NOT DETECTED Final   Streptococcus agalactiae NOT DETECTED NOT DETECTED Final   Streptococcus pneumoniae NOT DETECTED NOT DETECTED Final   Streptococcus pyogenes NOT DETECTED NOT DETECTED Final   A.calcoaceticus-baumannii NOT  DETECTED NOT DETECTED Final   Bacteroides fragilis NOT DETECTED NOT DETECTED Final   Enterobacterales NOT DETECTED NOT DETECTED Final   Enterobacter cloacae complex NOT DETECTED NOT DETECTED Final   Escherichia coli NOT DETECTED NOT DETECTED Final   Klebsiella aerogenes NOT DETECTED NOT DETECTED Final   Klebsiella oxytoca NOT DETECTED NOT DETECTED Final   Klebsiella pneumoniae NOT DETECTED NOT DETECTED Final   Proteus  species NOT DETECTED NOT DETECTED Final   Salmonella species NOT DETECTED NOT DETECTED Final   Serratia marcescens NOT DETECTED NOT DETECTED Final   Haemophilus influenzae NOT DETECTED NOT DETECTED Final   Neisseria meningitidis NOT DETECTED NOT DETECTED Final   Pseudomonas aeruginosa NOT DETECTED NOT DETECTED Final   Stenotrophomonas maltophilia NOT DETECTED NOT DETECTED Final   Candida albicans NOT DETECTED NOT DETECTED Final   Candida auris NOT DETECTED NOT DETECTED Final   Candida glabrata DETECTED (A) NOT DETECTED Final    Comment: CRITICAL RESULT CALLED TO, READ BACK BY AND VERIFIED WITH: PHARMD T. DANG [id number]@2018  FH    Candida krusei NOT DETECTED NOT DETECTED Final   Candida parapsilosis NOT DETECTED NOT DETECTED Final   Candida tropicalis NOT DETECTED NOT DETECTED Final   Cryptococcus neoformans/gattii NOT DETECTED NOT DETECTED Final    Comment: Performed at [facility], [address] [place], [place] [address]    Pertinent Lab.    Latest Ref Rng & Units 08/30/2021    5:56 AM 08/29/2021    4:14 AM 08/28/2021    3:57 AM  CBC  WBC 4.0 - 10.5 K/uL 6.0   6.7   7.5    Hemoglobin 12.0 - 15.0 g/dL 8.7   6.5   7.2    Hematocrit 36.0 - 46.0 % 26.0   19.1   21.6    Platelets 150 - 400 K/uL 38   37   39        Latest Ref Rng & Units 08/30/2021    5:56 AM 08/29/2021    4:14 AM 08/28/2021    3:57 AM  CMP  Glucose 70 - 99 mg/dL 981   191   478    BUN 8 - 23 mg/dL 62   58   43    Creatinine 0.44 - 1.00 mg/dL 2.95   6.21   3.08    Sodium 135 - 145 mmol/L 141   140   139    Potassium 3.5 - 5.1 mmol/L 4.4   3.7   3.4    Chloride 98 - 111 mmol/L 109   109   105    CO2 22 - 32 mmol/L 27   27   25     Calcium 8.9 - 10.3 mg/dL 7.8   7.4   7.4    Total Protein 6.5 - 8.1 g/dL 5.0   4.8   4.6    Total Bilirubin 0.3 - 1.2 mg/dL 1.6   1.3   2.3    Alkaline Phos 38 - 126 U/L 744   661   894    AST 15 - 41 U/L 130   121   140    ALT 0 - 44 U/L 59   52   53     Pertinent Imaging  today Plain films and CT images have been personally visualized and interpreted; radiology reports have been reviewed. Decision making incorporated into the Impression / Recommendations.  No results found.  I have personally spent 50 minutes involved in face-to-face and non-face-to-face activities for  this patient on the day of the visit. Professional time spent includes the following activities: Preparing to see the patient (review of tests), Obtaining and/or reviewing separately obtained history (admission/discharge record), Performing a medically appropriate examination and/or evaluation , Ordering medications/tests/procedures, referring and communicating with other health care professionals, Documenting clinical information in the EMR, Independently interpreting results (not separately reported), Communicating results to the patient/family/caregiver, Counseling and educating the patient/family/caregiver and Care coordination (not separately reported).   Electronically signed by:   Odette Fraction, MD Infectious Disease Physician [facility] for Infectious Disease Pager: [phone]

## 2021-08-30 NOTE — Progress Notes (Signed)
eLink Physician-Brief Progress Note ?Patient Name: Pershing CoxLivia S  ?DOB: 12/22/1960 ?MRN: [id number] ? ? ?Date of Service ? 08/30/2021  ?HPI/Events of Note ? Fibrinogen = 62. Will give Cryoprecipitate per Hematology recommendations.   ?eICU Interventions ? Plan: ?Transfuse Cryoprecipitate 1 pool now.  ?Repeat Fibrinogen level in AM  ? ? ? ?Intervention Category ?Major Interventions: Other: ? ?Mirella Gueye Dennard NipEugene ?08/30/2021, 8:17 PM ?

## 2021-08-30 NOTE — Progress Notes (Signed)
? ?NAME:  Carrie [name], MRN:  [id number], DOB:  1961-05-29, LOS: 12 ?ADMISSION DATE:  08/16/2021, CONSULTATION DATE:  3/17 ?REFERRING MD:  Darrick Meigs, CHIEF COMPLAINT:  Confusion  ? ?History of Present Illness:  ?Patient is a 61 year old female with PMHx as below iniitally admitted for AMS, abdominal pain, constipation, decreased PO intake with dehydration. There were reports that patient experienced similar symptoms when her prednisone was tapered down. She was noted to be pancytopenic for which oncology was consulted. Patient thought to have folic acid deficiency. ADAMTS13 nl. 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 Bone marrow biopsy obtained - hypoproductive without blast or malignant cells noted.  ?Initial CT Head and MRI Brain negative for acute intracranial abnormalities and patient's encephalopathy was thought to be secondary to steroid withdrawal. However, on 3/16, neurology consulted for progressively worsening encephalopathy. Patient recommended for LP and start empiric abx coverage for meningitis.Patient also started on acyclovir for possible viral encephalitis. Recommended for MR imaging of spine with elictable clonus in RUE and BLE.  ?DIC panel obtained on 3/17 with elevated D-dimer and low fibronogen with several small bruises on arms with worsening pancytopenia - patient was given 1u pRBC and cryo.  Experienced 3-min seizure activity after attempted central access placement - required intubation for airway protection.  ?LP performed on 3/18 - CSF non-infectious. Patient transferred to [facility] for LTM EEG monitoring  ? ?Pertinent  Medical History  ?Rheumatoid arthritis ?Dermatomyositis ?Fatty liver ?Hyperlipidemia ?Hypertension ?Rhabdomyolysis ? ?Significant Hospital Events: ?Including procedures, antibiotic start and stop dates in addition to other pertinent events   ?03/2021 labs: ANA positive, Jo-1 neg, centromere neg, ds DNA  neg, RNP positive, SSA/SSB neg, RF positive, chromatin Ab neg, CCP neg ?MRI 3/13-no acute findings ?3/17 bone marrow biopsy > hypercellular bone marrow with trilineage hematopoiesis, comment: non-specific, follow up cytogenetic studies ?3/12 CT abdomen and pelvis-no acute findings, adrenal nodule, diverticulosis ?3/12 CT head without contrast-no acute findings ?3/16 EEG-moderate diffuse encephalopathy of nonspecific etiology, no seizures or epileptiform discharges noted during the recording ?3/17 intubation ?3/18 LP performed: no leukocytes, protein elevated, transfer to [facility] for LTM EEG; PICC line placed ?3/20 remains on pulse dose steroids; intermittently tolerating pressure support ?3/21 started on IVIG ?3/24 seen by Heme/Onc, 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 ? ?Micro ?3/18 csf > neg to date ?3/18 csf fungus > neg  ?3/18 csf vzv > neg ?3/18 csf hsv > neg ?3/18 csf cryptococcal ag > neg ?3/18 csf vdrl > neg ?3/18 csf hsv > neg ?3/18  > lyme disease > negative  ?3/16 blood culture > neg to date ?3/12 blood > staph auricularis 1/4 bottles ?3/12 sars cov 2/flu > negative ?3/16 > parvoB19 IgG pos, IgM neg ?3/16 > EBV IgG pos, IgM neg ?3/16 > CMV IgG pos, IgM neg ?3/16 > HHV6 IgG pos ?3/17 > bone marrow biopsy > hypercellular with trilineage hematopoiesis ?3/18 blastomyces antigen > negative ?3/18 CSF fungal culture > pending ?3/23 blood culture 1/4 > c. glabrata ? ?Abx ?Rocephin 3/12 ?Acyclovir 3/16>3/19  ?Unasyn 3/19 > 3/24 ?Micafungin 3/24 >  ? ?Interim History / Subjective:  ? ?Blood culture is positive for c glabrata, started on micafungin ?Started on etoposide/dex on 3/24 ? ?Objective   ?Blood pressure 126/86, pulse 70, temperature 98.9 ?F (37.2 ?C), temperature source Oral, resp. rate 15, height $RemoveBe'5\' 3"'USMhXlTXL$  (1.6 m), weight 70 kg, SpO2 96 %. ?CVP:  [  13 mmHg-24 mmHg] 13 mmHg  ?Vent Mode: PRVC ?FiO2 (%):  [50 %-60 %] 60 % ?Set Rate:  [16 bmp] 16 bmp ?Vt Set:   [420 mL] 420 mL ?PEEP:  [8 cmH20] 8 cmH20 ?Plateau Pressure:  [16 cmH20-19 cmH20] 16 cmH20  ? ?Intake/Output Summary (Last 24 hours) at 08/30/2021 0714 ?Last data filed at 08/30/2021 0700 ?Gross per 24 hour  ?Intake 2824.89 ml  ?Output 1215 ml  ?Net 1609.89 ml  ? ?Filed Weights  ? 08/28/21 0356 08/29/21 0500 08/30/21 0500  ?Weight: 70.4 kg 69.2 kg 70 kg  ? ? ?Examination: ? ?General:  In bed on vent ?HENT: NCAT ETT in place ?PULM: CTA B, vent supported breathing ?CV: RRR, no mgr ?GI: BS+, soft, nontender ?MSK: normal bulk and tone ?Derm: massive edema legs/arms bilaterally ?Neuro: sedated on vent ? ? ?Resolved Hospital Problem list   ? ? ?Assessment & Plan:  ?Acute metabolic encephalopathy > concern for [place] ?Etoposide and dexamethasone per Heme/Onc ?Will need to clarify with them if they want to continue this on Monday with new finding of fungemia ? ?Acute respiratory faiure with hypoxemia  ?Aspiration pneumonia ?Full mechanical vent support ?VAP prevention ?Daily WUA/SBT ? ?Pancytopenia> presumably [place] ?As above ? ?Circulatory shock likely 2/2 sedation - improved off sedation ?Minimize sedation as above ? ?Acute renal failure ?Hypokalemia - resolved ?Monitor BMET and UOP ?Replace electrolytes as needed ?Lasix x1 dose ? ?Shock liver vs HLH effect ?LFT prn ? ?Hyperglycemia > worse ?Continue SSI ?Add tube feeding coverage insulin ? ?Possible seizure ?Keppra ? ?Constipation ?Continue lactulose for now ? ?Candida glabrata bacteremia> new problem 3/24 ?Will see if IV team can find place for peripheral IV's, but if they cannot will hold off on line exchange for now as another CVL won't give her a line holiday and the last attempt was difficult with significant hematoma complication in her neck ?Micafungin ?F/u ID recommendations ? ? ?Best Practice (right click and "Reselect all SmartList Selections" daily)  ? ?Diet/type: tubefeeds ?DVT prophylaxis: SCD ?GI prophylaxis: PPI ?Lines: see above ?Foley:  N/A and removal ordered   ?Code Status:  full code ?Last date of multidisciplinary goals of care discussion [3/25] ? ?Labs   ?CBC: ?Recent Labs  ?Lab 08/25/21 ?0454 08/25/21 ?1022 08/26/21 ?0640 08/27/21 ?0419 08/28/21 ?0357 08/29/21 ?0414 08/30/21 ?2336  ?WBC 4.5  --  8.3 10.2 7.5 6.7 6.0  ?NEUTROABS 3.3  --   --   --   --   --   --   ?HGB 8.9*   < > 8.0* 8.7* 7.2* 6.5* 8.7*  ?HCT 25.6*   < > 22.2* 25.2* 21.6* 19.1* 26.0*  ?MCV 85.3  --  84.4 87.2 88.2 88.8 90.9  ?PLT 55*  --  44* 48* 39* 37* 38*  ? < > = values in this interval not displayed.  ? ? ?Basic Metabolic Panel: ?Recent Labs  ?Lab 08/23/21 ?1611 08/24/21 ?0742 08/24/21 ?1711 08/24/21 ?2127 08/25/21 ?1224 08/25/21 ?1022 08/26/21 ?0640 08/27/21 ?0419 08/28/21 ?0357 08/29/21 ?0414 08/30/21 ?4975  ?NA  --  125* 128*   < > 131*   < > 132* 136 139 140 141  ?K  --  3.0* 3.4*   < > 4.4   < > 3.8 4.1 3.4* 3.7 4.4  ?CL  --  104 105  --  104  --  98 104 105 109 109  ?CO2  --  14* 15*  --  19*  --  $R'27 23 25 27 27  'hS$ ?GLUCOSE  --  280* 277*  --  102*  --  257* 176* 185* 139* 262*  ?BUN  --  19 23  --  28*  --  30* 37* 43* 58* 62*  ?CREATININE  --  1.16* 1.23*  --  1.52*  --  1.65* 1.64* 1.78* 1.52* 1.50*  ?CALCIUM  --  5.5* 6.6*  --  7.3*  --  6.6* 7.7* 7.4* 7.4* 7.8*  ?MG 1.2* 1.3* 2.2  --  2.3  --   --   --   --   --   --   ?PHOS 2.1* 2.5  2.8 3.1  --  2.7  --   --   --   --   --   --   ? < > = values in this interval not displayed.  ? ?GFR: ?Estimated Creatinine Clearance: 36.5 mL/min (A) (by C-G formula based on SCr of 1.5 mg/dL (H)). ?Recent Labs  ?Lab 08/27/21 ?0419 08/28/21 ?0357 08/29/21 ?0414 08/30/21 ?1146  ?WBC 10.2 7.5 6.7 6.0  ? ? ?Liver Function Tests: ?Recent Labs  ?Lab 08/26/21 ?0640 08/27/21 ?0419 08/28/21 ?0357 08/29/21 ?0414 08/30/21 ?4314  ?AST 103* 120* 140* 121* 130*  ?ALT 42 49* 53* 52* 59*  ?ALKPHOS 239* 604* 894* 661* 744*  ?BILITOT 1.6* 2.1* 2.3* 1.3* 1.6*  ?PROT 3.4* 4.1* 4.6* 4.8* 5.0*  ?ALBUMIN 1.7* 1.8* 1.5* <1.5* 1.5*  ? ?No results for input(s): LIPASE, AMYLASE  in the last 168 hours. ?No results for input(s): AMMONIA in the last 168 hours. ? ?ABG ?   ?Component Value Date/Time  ? PHART 7.327 (L) 08/25/2021 1022  ? PCO2ART 36.5 08/25/2021 1022  ? PO2ART 73 (L) 03/

## 2021-08-31 DIAGNOSIS — E278 Other specified disorders of adrenal gland: Secondary | ICD-10-CM | POA: Diagnosis not present

## 2021-08-31 DIAGNOSIS — F418 Other specified anxiety disorders: Secondary | ICD-10-CM | POA: Diagnosis not present

## 2021-08-31 DIAGNOSIS — E872 Acidosis, unspecified: Secondary | ICD-10-CM | POA: Diagnosis not present

## 2021-08-31 DIAGNOSIS — G9340 Encephalopathy, unspecified: Secondary | ICD-10-CM | POA: Diagnosis not present

## 2021-08-31 LAB — GLUCOSE, CAPILLARY
Glucose-Capillary: 170 mg/dL — ABNORMAL HIGH (ref 70–99)
Glucose-Capillary: 168 mg/dL — ABNORMAL HIGH (ref 70–99)
Glucose-Capillary: 168 mg/dL — ABNORMAL HIGH (ref 70–99)
Glucose-Capillary: 214 mg/dL — ABNORMAL HIGH (ref 70–99)
Glucose-Capillary: 193 mg/dL — ABNORMAL HIGH (ref 70–99)
Glucose-Capillary: 198 mg/dL — ABNORMAL HIGH (ref 70–99)

## 2021-08-31 LAB — FIBRINOGEN: Fibrinogen: 182 mg/dL — ABNORMAL LOW (ref 210–475)

## 2021-08-31 LAB — CBC
HCT: 25.2 % — ABNORMAL LOW (ref 36.0–46.0)
Hemoglobin: 8.2 g/dL — ABNORMAL LOW (ref 12.0–15.0)
MCH: 29.5 pg (ref 26.0–34.0)
MCHC: 32.5 g/dL (ref 30.0–36.0)
MCV: 90.6 fL (ref 80.0–100.0)
Platelets: 27 10*3/uL — CL (ref 150–400)
RBC: 2.78 MIL/uL — ABNORMAL LOW (ref 3.87–5.11)
RDW: 18.6 % — ABNORMAL HIGH (ref 11.5–15.5)
WBC: 5.4 10*3/uL (ref 4.0–10.5)
nRBC: 1.1 % — ABNORMAL HIGH (ref 0.0–0.2)

## 2021-08-31 LAB — BPAM CRYOPRECIPITATE
Blood Product Expiration Date: 202303260246
ISSUE DATE / TIME: 202303252108
Unit Type and Rh: 6200

## 2021-08-31 LAB — COMPREHENSIVE METABOLIC PANEL
ALT: 57 U/L — ABNORMAL HIGH (ref 0–44)
AST: 109 U/L — ABNORMAL HIGH (ref 15–41)
Albumin: 1.7 g/dL — ABNORMAL LOW (ref 3.5–5.0)
Alkaline Phosphatase: 630 U/L — ABNORMAL HIGH (ref 38–126)
Anion gap: 4 — ABNORMAL LOW (ref 5–15)
BUN: 71 mg/dL — ABNORMAL HIGH (ref 8–23)
CO2: 27 mmol/L (ref 22–32)
Calcium: 8.6 mg/dL — ABNORMAL LOW (ref 8.9–10.3)
Chloride: 112 mmol/L — ABNORMAL HIGH (ref 98–111)
Creatinine, Ser: 1.31 mg/dL — ABNORMAL HIGH (ref 0.44–1.00)
GFR, Estimated: 46 mL/min — ABNORMAL LOW (ref >60)
Glucose, Bld: 158 mg/dL — ABNORMAL HIGH (ref 70–99)
Potassium: 3.8 mmol/L (ref 3.5–5.1)
Sodium: 143 mmol/L (ref 135–145)
Total Bilirubin: 1.4 mg/dL — ABNORMAL HIGH (ref 0.3–1.2)
Total Protein: 5.1 g/dL — ABNORMAL LOW (ref 6.5–8.1)

## 2021-08-31 LAB — PREPARE CRYOPRECIPITATE: Unit division: 0

## 2021-08-31 LAB — CRYOGLOBULIN

## 2021-08-31 MED ORDER — FUROSEMIDE 10 MG/ML IJ SOLN
40.0000 mg | Freq: Four times a day (QID) | INTRAMUSCULAR | Status: AC
  Administered 2021-08-31 (×2): 40 mg via INTRAVENOUS
  Filled 2021-08-31 (×2): qty 4

## 2021-08-31 NOTE — Progress Notes (Signed)
[facility] PCCM ? ?Updated family, advised that we are unable to treat the HLH with fungemia. Will consider restarting HLH treatment as soon as we can (later in the week?) but that overall her prognosis is poor and she is suffering.  Addressed code status, they need to discuss as a family prior to making a decision.  They are agreeable to a palliative care consultation. ? ?Heber [place], MD ? PCCM ?Pager: [phone] ?Cell: [phone] ?After 7:00 pm call Elink  [phone] ? ?

## 2021-08-31 NOTE — Progress Notes (Signed)
[phone] paged ?

## 2021-08-31 NOTE — Progress Notes (Signed)
Patient with Candida glabrata bacteremia. ?Discontinued high dose dex  ?Etoposide will have to be held. ?Dr Burr Medico informed. ?CBC from today reviewed ?No indication for transfusion. ? ?  ? ? ?  ? ?

## 2021-08-31 NOTE — Progress Notes (Signed)
Order to advance ETT 3 cm, NP to assess. ?

## 2021-08-31 NOTE — Progress Notes (Signed)
Pt w/ spells of tachycardia, tachypneia, stiffness, eyes to left. MD to bedside. ?

## 2021-08-31 NOTE — Progress Notes (Signed)
? ?NAME:  Carrie [name], MRN:  [id number], DOB:  [birth year], LOS: 48 ?ADMISSION DATE:  09/01/2021, CONSULTATION DATE:  3/17 ?REFERRING MD:  Darrick Meigs, CHIEF COMPLAINT:  Confusion  ? ?History of Present Illness:  ?Patient is a 61 year old female with PMHx as below iniitally admitted for AMS, abdominal pain, constipation, decreased PO intake with dehydration. There were reports that patient experienced similar symptoms when her prednisone was tapered down. She was noted to be pancytopenic for which oncology was consulted. Patient thought to have folic acid deficiency. ADAMTS13 nl. 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 Bone marrow biopsy obtained - hypoproductive without blast or malignant cells noted.  ?Initial CT Head and MRI Brain negative for acute intracranial abnormalities and patient's encephalopathy was thought to be secondary to steroid withdrawal. However, on 3/16, neurology consulted for progressively worsening encephalopathy. Patient recommended for LP and start empiric abx coverage for meningitis.Patient also started on acyclovir for possible viral encephalitis. Recommended for MR imaging of spine with elictable clonus in RUE and BLE.  ?DIC panel obtained on 3/17 with elevated D-dimer and low fibronogen with several small bruises on arms with worsening pancytopenia - patient was given 1u pRBC and cryo.  Experienced 3-min seizure activity after attempted central access placement - required intubation for airway protection.  ?LP performed on 3/18 - CSF non-infectious. Patient transferred to [facility] for LTM EEG monitoring  ? ?Pertinent  Medical History  ?Rheumatoid arthritis ?Dermatomyositis ?Fatty liver ?Hyperlipidemia ?Hypertension ?Rhabdomyolysis ? ?Significant Hospital Events: ?Including procedures, antibiotic start and stop dates in addition to other pertinent events   ?03/2021 labs: ANA positive, Jo-1 neg, centromere neg, ds DNA  neg, RNP positive, SSA/SSB neg, RF positive, chromatin Ab neg, CCP neg ?MRI 3/13-no acute findings ?3/17 bone marrow biopsy > hypercellular bone marrow with trilineage hematopoiesis, comment: non-specific, follow up cytogenetic studies ?3/12 CT abdomen and pelvis-no acute findings, adrenal nodule, diverticulosis ?3/12 CT head without contrast-no acute findings ?3/16 EEG-moderate diffuse encephalopathy of nonspecific etiology, no seizures or epileptiform discharges noted during the recording ?3/17 intubation ?3/18 LP performed: no leukocytes, protein elevated, transfer to [facility] for LTM EEG; PICC line placed ?3/20 remains on pulse dose steroids; intermittently tolerating pressure support ?3/21 started on IVIG ?3/24 seen by Heme/Onc, 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 ? ?Micro ?3/18 csf > neg to date ?3/18 csf fungus > neg  ?3/18 csf vzv > neg ?3/18 csf hsv > neg ?3/18 csf cryptococcal ag > neg ?3/18 csf vdrl > neg ?3/18 csf hsv > neg ?3/18  > lyme disease > negative  ?3/16 blood culture > neg to date ?3/12 blood > staph auricularis 1/4 bottles ?3/12 sars cov 2/flu > negative ?3/16 > parvoB19 IgG pos, IgM neg ?3/16 > EBV IgG pos, IgM neg ?3/16 > CMV IgG pos, IgM neg ?3/16 > HHV6 IgG pos ?3/17 > bone marrow biopsy > hypercellular with trilineage hematopoiesis ?3/18 blastomyces antigen > negative ?3/18 CSF fungal culture > pending ?3/23 blood culture 1/4 > c. Glabrata ?3/26 blood culture >  ? ?Abx ?Rocephin 3/12 ?Acyclovir 3/16>3/19  ?Unasyn 3/19 > 3/24 ?Micafungin 3/24 >  ? ?Interim History / Subjective:  ? ?Posturing, tremor today, unclear if seizure activity ?Remains mechanically ventilated ? ?Objective   ?Blood pressure (!) 86/66, pulse 93, temperature 99.4 ?F (37.4 ?C), temperature source Axillary, resp. rate (!) 23, height $RemoveBe'5\' 3"'ugsmOSFBa$  (1.6 m), weight 70 kg, SpO2 97 %. ?   ?  Vent Mode: PRVC ?FiO2 (%):  [40 %-50 %] 40 % ?Set Rate:  [16 bmp] 16 bmp ?Vt Set:  [420 mL]  420 mL ?PEEP:  [8 cmH20] 8 cmH20 ?Plateau Pressure:  [11 cmH20-21 cmH20] 11 cmH20  ? ?Intake/Output Summary (Last 24 hours) at 08/31/2021 0754 ?Last data filed at 08/31/2021 6948 ?Gross per 24 hour  ?Intake 2940.89 ml  ?Output 2540 ml  ?Net 400.89 ml  ? ?Filed Weights  ? 08/28/21 0356 08/29/21 0500 08/30/21 0500  ?Weight: 70.4 kg 69.2 kg 70 kg  ? ? ?Examination: ? ?General:  In bed on vent ?HENT: NCAT ETT in place ?PULM: CTA B, vent supported breathing ?CV: RRR, no mgr ?GI: BS+, soft, nontender ?MSK: normal bulk and tone ?Derm: massive edema, multiple bruises ?Neuro: upward gaze, feet in extended posture with fine tremor  ? ? ?Resolved Hospital Problem list   ?Aspiration pneumonia ? ?Assessment & Plan:  ?Acute metabolic encephalopathy > concern for [place] ?Seizure 3/26 unclear or if this is posturing related to brain injury from [facility] ?Hold etoposide and dexamethasone for now with fungemia ?Use sedation for PAD Protocol (precedex, fentanyl) for vent synchrony ?Versed prn ?Ask neuro to see her today> repeat EEG? ? ?Acute respiratory faiure with hypoxemia  ?Full mechanical vent support ?VAP prevention ?Daily WUA/SBT ? ?Pancytopenia> presumably [place] ?Monitor for bleeding ?Transfuse PRBC for Hgb < 7 gm/dL ?As above ? ?Circulatory shock likely 2/2 sedation - improved off sedation ?Minimize sedation  ? ?Acute renal failure ?Hypokalemia - resolved ?Monitor BMET and UOP ?Replace electrolytes as needed ?Lasix x1 dose ? ?Shock liver vs HLH effect ?LFT prn ? ?Hyperglycemia > improved after adding tube feeding coverage ?Continue SSI ?Continue tube feeding coverage ? ?Possible seizure ?Keppra ? ?Constipation ?lactulose ? ?Candida glabrata bacteremia> new problem 3/24 ?Continue central line holiday (PICC line removed) ?F/u blood cultures sent on 3/26 early AM > if able negative could replace PICC ?Continue micafungin ?F/u ID recommendations ? ? ?Best Practice (right click and "Reselect all SmartList Selections" daily)  ? ?Diet/type:  tubefeeds ?DVT prophylaxis: SCD ?GI prophylaxis: PPI ?Lines: see above ?Foley:  N/A and removal ordered  ?Code Status:  full code ?Last date of multidisciplinary goals of care discussion [3/25] ? ?Labs   ?CBC: ?Recent Labs  ?Lab 08/25/21 ?0454 08/25/21 ?1022 08/27/21 ?0419 08/28/21 ?0357 08/29/21 ?0414 08/30/21 ?5462 08/31/21 ?7035  ?WBC 4.5   < > 10.2 7.5 6.7 6.0 5.4  ?NEUTROABS 3.3  --   --   --   --   --   --   ?HGB 8.9*   < > 8.7* 7.2* 6.5* 8.7* 8.2*  ?HCT 25.6*   < > 25.2* 21.6* 19.1* 26.0* 25.2*  ?MCV 85.3   < > 87.2 88.2 88.8 90.9 90.6  ?PLT 55*   < > 48* 39* 37* 38* 27*  ? < > = values in this interval not displayed.  ? ? ?Basic Metabolic Panel: ?Recent Labs  ?Lab 08/24/21 ?1711 08/24/21 ?2127 08/25/21 ?0454 08/25/21 ?1022 08/27/21 ?0419 08/28/21 ?0357 08/29/21 ?0414 08/30/21 ?0093 08/31/21 ?8182  ?NA 128*   < > 131*   < > 136 139 140 141 143  ?K 3.4*   < > 4.4   < > 4.1 3.4* 3.7 4.4 3.8  ?CL 105  --  104   < > 104 105 109 109 112*  ?CO2 15*  --  19*   < > $R'23 25 27 27 27  'ea$ ?GLUCOSE 277*  --  102*   < >  176* 185* 139* 262* 158*  ?BUN 23  --  28*   < > 37* 43* 58* 62* 71*  ?CREATININE 1.23*  --  1.52*   < > 1.64* 1.78* 1.52* 1.50* 1.31*  ?CALCIUM 6.6*  --  7.3*   < > 7.7* 7.4* 7.4* 7.8* 8.6*  ?MG 2.2  --  2.3  --   --   --   --   --   --   ?PHOS 3.1  --  2.7  --   --   --   --   --   --   ? < > = values in this interval not displayed.  ? ?GFR: ?Estimated Creatinine Clearance: 41.8 mL/min (A) (by C-G formula based on SCr of 1.31 mg/dL (H)). ?Recent Labs  ?Lab 08/28/21 ?6286 08/29/21 ?0414 08/30/21 ?3817 08/31/21 ?7116  ?WBC 7.5 6.7 6.0 5.4  ? ? ?Liver Function Tests: ?Recent Labs  ?Lab 08/27/21 ?0419 08/28/21 ?0357 08/29/21 ?0414 08/30/21 ?5790 08/31/21 ?3833  ?AST 120* 140* 121* 130* 109*  ?ALT 49* 53* 52* 59* 57*  ?ALKPHOS 604* 383* 661* 744* 630*  ?BILITOT 2.1* 2.3* 1.3* 1.6* 1.4*  ?PROT 4.1* 4.6* 4.8* 5.0* 5.1*  ?ALBUMIN 1.8* 1.5* <1.5* 1.5* 1.7*  ? ?No results for input(s): LIPASE, AMYLASE in the last 168  hours. ?No results for input(s): AMMONIA in the last 168 hours. ? ?ABG ?   ?Component Value Date/Time  ? PHART 7.327 (L) 08/25/2021 1022  ? PCO2ART 36.5 08/25/2021 1022  ? PO2ART 73 (L) 08/25/2021 1022  ? HCO

## 2021-08-31 NOTE — Progress Notes (Signed)
ID Brief Note  ? ?Low garde fevers ?Tremors vs ? seizure like activity ? ?Repeat blood cx 3/26 ( 6:42 and 6:52am ) sent  ?PICC line has been removed 3/25 per nursing  ? ?Dexamethasone and etoposide stopped per Oncology due to fungemia ?Continue Micafungin ?Fu sensi of candida glabrata ?Fu repeat blood cultures for clearance ?Line holiday as long as hemodynamics permits ?Fu pending ID labs  ?Planned for palliative care consultation ? ?New ID team will assume care starting Monday ? ?Rosiland Oz, MD ?Infectious Disease Physician ?[facility] ?[address] ?[place], [place] [address] ?Phone: [phone]  Fax: [phone] ? ? ? ?

## 2021-08-31 NOTE — Progress Notes (Signed)
ETT advanced by RT per order. VS WNL, returning volumes, no audible leak ?

## 2021-08-31 NOTE — Progress Notes (Signed)
Per verbal order by Dr. Kendrick Fries, RT advanced pt's ETT 3cm. Pt's ETT is now 24 at the lip with good volume return. RN at bedside and notified. ?

## 2021-08-31 NOTE — Progress Notes (Signed)
Pt tachycardic, tachypneic in 40s, not returning volumes, MD paged, RT RN at bedside, sedation administered. ?

## 2021-09-01 ENCOUNTER — Inpatient Hospital Stay (HOSPITAL_COMMUNITY): Payer: Federal, State, Local not specified - PPO

## 2021-09-01 ENCOUNTER — Encounter (HOSPITAL_COMMUNITY): Payer: Self-pay | Admitting: Hematology

## 2021-09-01 DIAGNOSIS — R7989 Other specified abnormal findings of blood chemistry: Secondary | ICD-10-CM

## 2021-09-01 DIAGNOSIS — J9601 Acute respiratory failure with hypoxia: Secondary | ICD-10-CM

## 2021-09-01 DIAGNOSIS — E872 Acidosis, unspecified: Secondary | ICD-10-CM | POA: Diagnosis not present

## 2021-09-01 DIAGNOSIS — B377 Candidal sepsis: Secondary | ICD-10-CM | POA: Diagnosis not present

## 2021-09-01 DIAGNOSIS — G9340 Encephalopathy, unspecified: Secondary | ICD-10-CM | POA: Diagnosis not present

## 2021-09-01 DIAGNOSIS — M069 Rheumatoid arthritis, unspecified: Secondary | ICD-10-CM | POA: Diagnosis not present

## 2021-09-01 DIAGNOSIS — F418 Other specified anxiety disorders: Secondary | ICD-10-CM | POA: Diagnosis not present

## 2021-09-01 DIAGNOSIS — Z7189 Other specified counseling: Secondary | ICD-10-CM

## 2021-09-01 DIAGNOSIS — D761 Hemophagocytic lymphohistiocytosis: Secondary | ICD-10-CM | POA: Diagnosis not present

## 2021-09-01 DIAGNOSIS — E278 Other specified disorders of adrenal gland: Secondary | ICD-10-CM | POA: Diagnosis not present

## 2021-09-01 LAB — COMPREHENSIVE METABOLIC PANEL
ALT: 50 U/L — ABNORMAL HIGH (ref 0–44)
AST: 96 U/L — ABNORMAL HIGH (ref 15–41)
Albumin: <1.5 g/dL — ABNORMAL LOW (ref 3.5–5.0)
Alkaline Phosphatase: 465 U/L — ABNORMAL HIGH (ref 38–126)
Anion gap: 7 (ref 5–15)
BUN: 87 mg/dL — ABNORMAL HIGH (ref 8–23)
CO2: 24 mmol/L (ref 22–32)
Calcium: 8.6 mg/dL — ABNORMAL LOW (ref 8.9–10.3)
Chloride: 113 mmol/L — ABNORMAL HIGH (ref 98–111)
Creatinine, Ser: 1.3 mg/dL — ABNORMAL HIGH (ref 0.44–1.00)
GFR, Estimated: 46 mL/min — ABNORMAL LOW (ref >60)
Glucose, Bld: 152 mg/dL — ABNORMAL HIGH (ref 70–99)
Potassium: 3.5 mmol/L (ref 3.5–5.1)
Sodium: 144 mmol/L (ref 135–145)
Total Bilirubin: 1.1 mg/dL (ref 0.3–1.2)
Total Protein: 4.4 g/dL — ABNORMAL LOW (ref 6.5–8.1)

## 2021-09-01 LAB — CBC WITH DIFFERENTIAL/PLATELET
Abs Immature Granulocytes: 0.08 10*3/uL — ABNORMAL HIGH (ref 0.00–0.07)
Basophils Absolute: 0 10*3/uL (ref 0.0–0.1)
Basophils Relative: 0 %
Eosinophils Absolute: 0 10*3/uL (ref 0.0–0.5)
Eosinophils Relative: 0 %
HCT: 20.4 % — ABNORMAL LOW (ref 36.0–46.0)
Hemoglobin: 6.9 g/dL — CL (ref 12.0–15.0)
Immature Granulocytes: 1 %
Lymphocytes Relative: 8 %
Lymphs Abs: 0.5 10*3/uL — ABNORMAL LOW (ref 0.7–4.0)
MCH: 31.1 pg (ref 26.0–34.0)
MCHC: 33.8 g/dL (ref 30.0–36.0)
MCV: 91.9 fL (ref 80.0–100.0)
Monocytes Absolute: 0 10*3/uL — ABNORMAL LOW (ref 0.1–1.0)
Monocytes Relative: 0 %
Neutro Abs: 5.3 10*3/uL (ref 1.7–7.7)
Neutrophils Relative %: 91 %
Platelets: 27 10*3/uL — CL (ref 150–400)
RBC: 2.22 MIL/uL — ABNORMAL LOW (ref 3.87–5.11)
RDW: 18.7 % — ABNORMAL HIGH (ref 11.5–15.5)
Smear Review: DECREASED
WBC: 5.9 10*3/uL (ref 4.0–10.5)
nRBC: 0 % (ref 0.0–0.2)

## 2021-09-01 LAB — GLUCOSE, CAPILLARY
Glucose-Capillary: 126 mg/dL — ABNORMAL HIGH (ref 70–99)
Glucose-Capillary: 176 mg/dL — ABNORMAL HIGH (ref 70–99)
Glucose-Capillary: 148 mg/dL — ABNORMAL HIGH (ref 70–99)
Glucose-Capillary: 96 mg/dL (ref 70–99)
Glucose-Capillary: 118 mg/dL — ABNORMAL HIGH (ref 70–99)
Glucose-Capillary: 150 mg/dL — ABNORMAL HIGH (ref 70–99)

## 2021-09-01 LAB — PREPARE RBC (CROSSMATCH)

## 2021-09-01 LAB — CK: Total CK: 56 U/L (ref 38–234)

## 2021-09-01 IMAGING — DX DG ABD PORTABLE 1V
1 series · 1 of 1 positions shown · non-contrast
Comparison: [DATE]

CLINICAL DATA: Feeding tube placement

EXAM:
PORTABLE ABDOMEN - 1 VIEW

[abdomen]
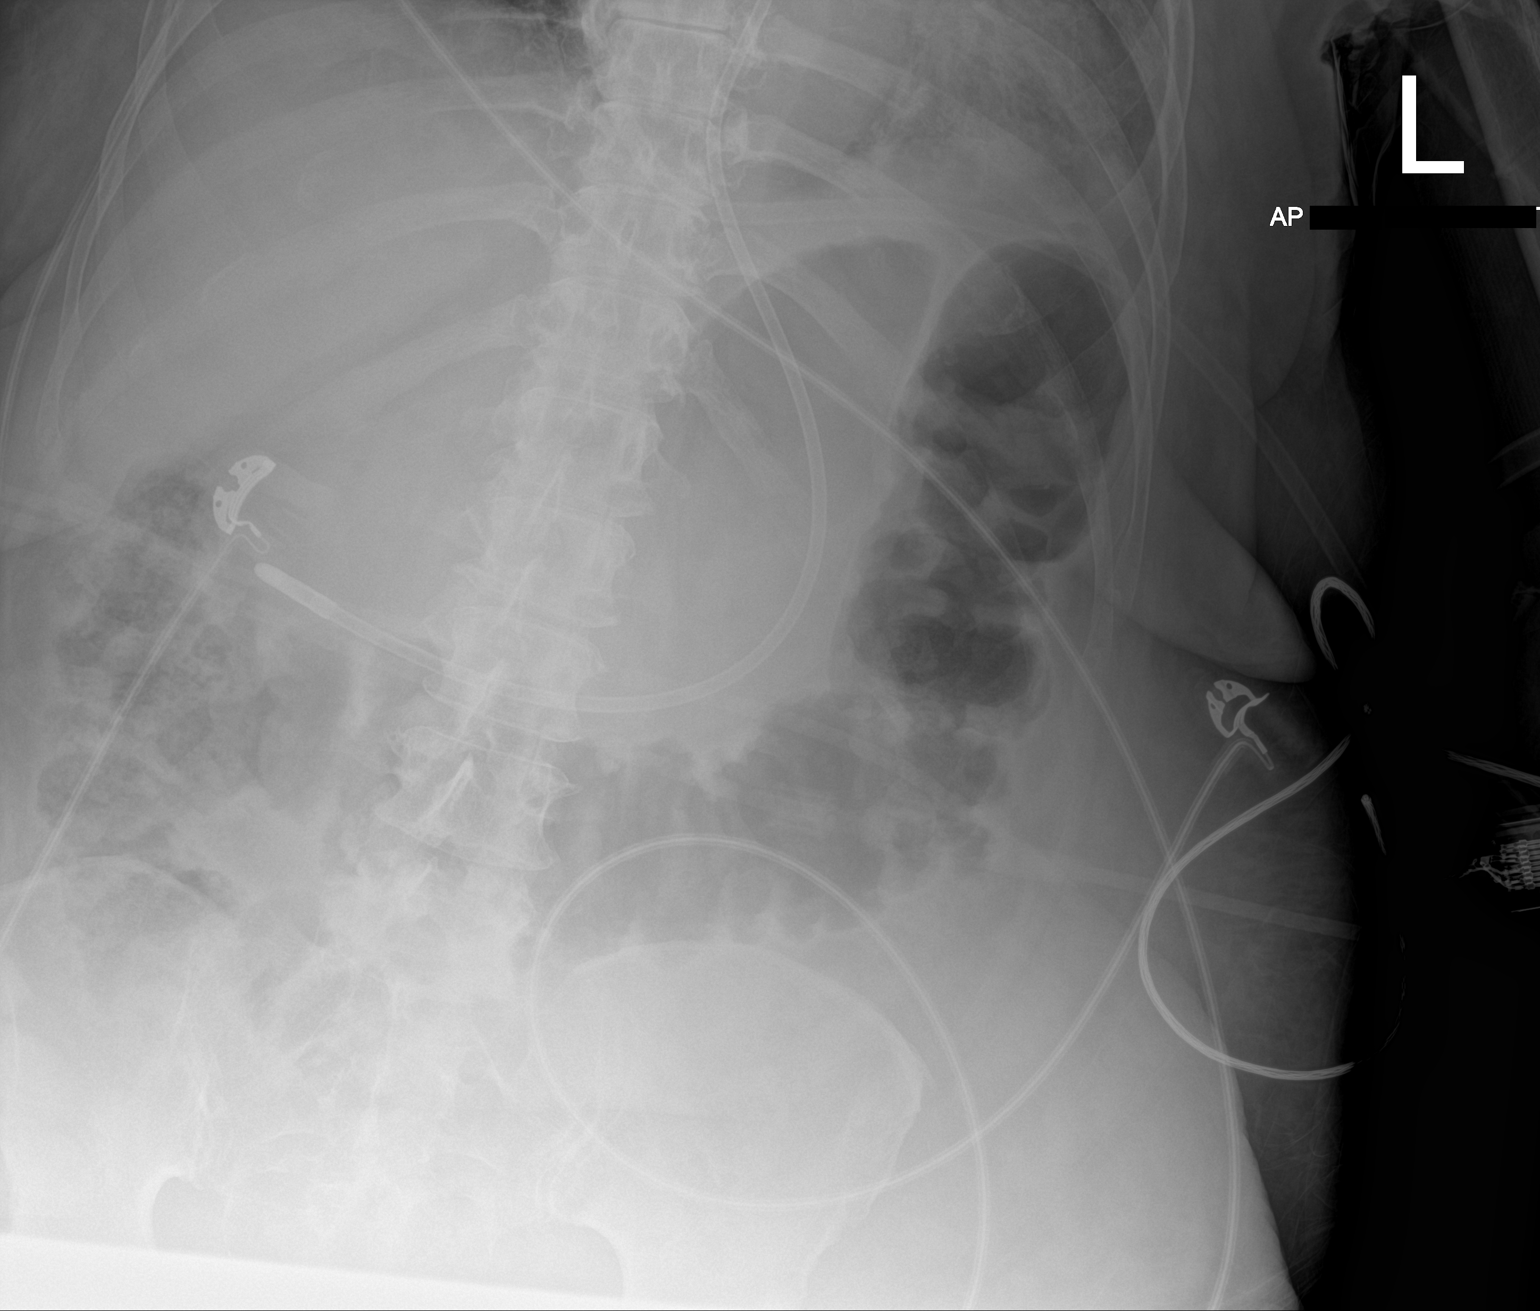

[1 of 1 positions shown; findings below may reference images not displayed]

FINDINGS: Feeding tube terminates at the antropyloric region. No bowel
obstruction or gross free intraperitoneal air. Low pelvis excluded.
IMPRESSION: Feeding tube terminating at the antrum/pyloric region.

## 2021-09-01 MED ORDER — MIDAZOLAM HCL 2 MG/2ML IJ SOLN
INTRAMUSCULAR | Status: AC
  Filled 2021-09-01: qty 2

## 2021-09-01 MED ORDER — INSULIN ASPART 100 UNIT/ML IJ SOLN
8.0000 [IU] | INTRAMUSCULAR | Status: DC
  Administered 2021-09-01 – 2021-09-03 (×7): 8 [IU] via SUBCUTANEOUS

## 2021-09-01 MED ORDER — FUROSEMIDE 10 MG/ML IJ SOLN: 80.0000 mg | Freq: Two times a day (BID) | INTRAMUSCULAR | Status: DC

## 2021-09-01 MED ORDER — CLONAZEPAM 0.25 MG PO TBDP
0.5000 mg | ORAL_TABLET | Freq: Two times a day (BID) | ORAL | Status: DC
  Administered 2021-09-01 (×2): 0.5 mg
  Filled 2021-09-01 (×2): qty 2

## 2021-09-01 MED ORDER — POTASSIUM CHLORIDE 20 MEQ PO PACK
40.0000 meq | PACK | Freq: Once | ORAL | Status: AC
  Administered 2021-09-01: 40 meq
  Filled 2021-09-01: qty 2

## 2021-09-01 MED ORDER — SODIUM CHLORIDE 0.9% IV SOLUTION: Freq: Once | INTRAVENOUS | Status: DC

## 2021-09-01 MED ORDER — FUROSEMIDE 10 MG/ML IJ SOLN
80.0000 mg | Freq: Two times a day (BID) | INTRAMUSCULAR | Status: DC
  Administered 2021-09-01: 80 mg via INTRAVENOUS
  Filled 2021-09-01: qty 8

## 2021-09-01 MED ORDER — DEXAMETHASONE SODIUM PHOSPHATE 4 MG/ML IJ SOLN
1.0000 mg | INTRAMUSCULAR | Status: DC
  Administered 2021-09-01 – 2021-09-04 (×4): 1 mg via INTRAVENOUS
  Filled 2021-09-01 (×5): qty 1

## 2021-09-01 MED ORDER — FUROSEMIDE 10 MG/ML IJ SOLN
40.0000 mg | Freq: Two times a day (BID) | INTRAMUSCULAR | Status: DC
  Administered 2021-09-01 – 2021-09-03 (×4): 40 mg via INTRAVENOUS
  Filled 2021-09-01 (×4): qty 4

## 2021-09-01 MED ORDER — MIDAZOLAM HCL 2 MG/2ML IJ SOLN
2.0000 mg | INTRAMUSCULAR | Status: DC | PRN
  Administered 2021-09-01 – 2021-09-21 (×9): 2 mg via INTRAVENOUS
  Filled 2021-09-01 (×10): qty 2

## 2021-09-01 NOTE — Progress Notes (Signed)
eLink Physician-Brief Progress Note ?Patient Name: Carrie [name] ?DOB: 1960-07-27 ?MRN: [id number] ? ? ?Date of Service ? 09/01/2021  ?HPI/Events of Note ? Pancytopenia - Hgb = 6.9 and Platelets = 27.   ?eICU Interventions ? Plan: ?Transfuse 1 unit PRBC. ?Would only transfuse platelets for active bleeding or platelets < 10.  ? ? ? ?Intervention Category ?Major Interventions: Other: ? ?Eisa Necaise Cornelia Copa ?09/01/2021, 4:26 AM ?

## 2021-09-01 NOTE — Progress Notes (Signed)
? ?NAME:  Carrie [name], MRN:  [id number], DOB:  04/23/1961, LOS: 14 ?ADMISSION DATE:  08/12/2021, CONSULTATION DATE:  3/17 ?REFERRING MD:  Carrie [name], CHIEF COMPLAINT:  Confusion  ? ?History of Present Illness:  ?Patient is a 61 year old female with PMHx as below iniitally admitted for AMS, abdominal pain, constipation, decreased PO intake with dehydration. There were reports that patient experienced similar symptoms when her prednisone was tapered down. She was noted to be pancytopenic for which oncology was consulted. Patient thought to have folic acid deficiency. ADAMTS13 nl. 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 Bone marrow biopsy obtained - hypoproductive without blast or malignant cells noted.  ?Initial CT Head and MRI Brain negative for acute intracranial abnormalities and patient's encephalopathy was thought to be secondary to steroid withdrawal. However, on 3/16, neurology consulted for progressively worsening encephalopathy. Patient recommended for LP and start empiric abx coverage for meningitis.Patient also started on acyclovir for possible viral encephalitis. Recommended for MR imaging of spine with elictable clonus in RUE and BLE.  ?DIC panel obtained on 3/17 with elevated D-dimer and low fibronogen with several small bruises on arms with worsening pancytopenia - patient was given 1u pRBC and cryo.  Experienced 3-min seizure activity after attempted central access placement - required intubation for airway protection.  ?LP performed on 3/18 - CSF non-infectious. Patient transferred to [facility] for LTM EEG monitoring  ? ?Pertinent  Medical History  ?Rheumatoid arthritis ?Dermatomyositis ?Fatty liver ?Hyperlipidemia ?Hypertension ?Rhabdomyolysis ? ?Significant Hospital Events: ?Including procedures, antibiotic start and stop dates in addition to other pertinent events   ?03/2021 labs: ANA positive, Jo-1 neg, centromere neg, ds DNA  neg, RNP positive, SSA/SSB neg, RF positive, chromatin Ab neg, CCP neg ?MRI 3/13-no acute findings ?3/17 bone marrow biopsy > hypercellular bone marrow with trilineage hematopoiesis, comment: non-specific, follow up cytogenetic studies ?3/12 CT abdomen and pelvis-no acute findings, adrenal nodule, diverticulosis ?3/12 CT head without contrast-no acute findings ?3/16 EEG-moderate diffuse encephalopathy of nonspecific etiology, no seizures or epileptiform discharges noted during the recording ?3/17 intubation ?3/18 LP performed: no leukocytes, protein elevated, transfer to [facility] for LTM EEG; PICC line placed ?3/20 remains on pulse dose steroids; intermittently tolerating pressure support ?3/21 started on IVIG ?3/24 seen by Heme/Onc, 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 ?3/26 Candida fungemia for which started on micafungin. Dex and Etoposide stopped. [place] discussion with family - palliative care consulted  ? ?Micro ?3/18 csf > neg to date ?3/18 csf fungus > neg  ?3/18 csf vzv > neg ?3/18 csf hsv > neg ?3/18 csf cryptococcal ag > neg ?3/18 csf vdrl > neg ?3/18 csf hsv > neg ?3/18  > lyme disease > negative  ?3/16 blood culture > neg to date ?3/12 blood > staph auricularis 1/4 bottles ?3/12 sars cov 2/flu > negative ?3/16 > parvoB19 IgG pos, IgM neg ?3/16 > EBV IgG pos, IgM neg ?3/16 > CMV IgG pos, IgM neg ?3/16 > HHV6 IgG pos ?3/17 > bone marrow biopsy > hypercellular with trilineage hematopoiesis ?3/18 blastomyces antigen > negative ?3/18 CSF fungal culture > pending ?3/23 blood culture 1/4 > c. Glabrata ?3/26 blood culture >  ? ?Abx ?Rocephin 3/12 ?Acyclovir 3/16>3/19  ?Unasyn 3/19 > 3/24 ?Micafungin 3/24 >  ? ?Interim History / Subjective:  ?Patient remains sedated and mechanically ventilated. No pressor requirement at this time.  ? ?Objective   ?Blood pressure 106/72, pulse 88,  temperature 100 ?F (37.8 ?C), temperature source Axillary, resp. rate (!) 24, height  $Remov'5\' 3"'HDskrz$  (1.6 m), weight 70 kg, SpO2 99 %. ?   ?Vent Mode: PRVC ?FiO2 (%):  [40 %-50 %] 50 % ?Set Rate:  [16 bmp] 16 bmp ?Vt Set:  [420 mL] 420 mL ?PEEP:  [8 cmH20] 8 cmH20 ?Plateau Pressure:  [11 cmH20-20 cmH20] 18 cmH20  ? ?Intake/Output Summary (Last 24 hours) at 09/01/2021 0714 ?Last data filed at 09/01/2021 0600 ?Gross per 24 hour  ?Intake 2543.62 ml  ?Output 1935 ml  ?Net 608.62 ml  ? ?Filed Weights  ? 08/28/21 0356 08/29/21 0500 08/30/21 0500  ?Weight: 70.4 kg 69.2 kg 70 kg  ? ? ?Examination: ?General:  Critically ill appearing middle aged female; on vent support ?HENT: NCAT ETT in place; disconjugate gaze, PERRL ?PULM: bibasilar decreased breath sounds; on full vent support  ?CV: RRR, no mgr, distal pulses intact  ?GI: BS diminished, soft, nontender ?MSK: normal bulk and tone, diffuse anasarca  ?Neuro: pupils reactive; upward gaze; feet in extended posture.  ? ?Resolved Hospital Problem list   ?Aspiration pneumonia ?Circulatory shock likely 2/2 sedation - improved off sedation ? 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  ?- ID consulted, appreciate recommendations  ?- Continue Micafungin ?- Holding etoposide at this time  ?- Sedation for PAD protocol - precedex and fentanyl; starting klonopin today  ?- F/u repeat blood cultures  ? ?Acute respiratory faiure with hypoxemia  ?Full mechanical vent support ?VAP prevention ?Daily WUA/SBT ? ?Pancytopenia> candidemia vs HLH ?Hb 6.9, Plt 27 this AM. Received 1u pRBC.  ?Monitor for bleeding ?Transfuse PRBC for Hgb < 7 gm/dL ?As above ? ?Hypervolemia ?Diffuse anasarca on exam  ?- Lasix $Remove'80mg'GICRAuE$  bid  ?- Trend renal function ?- Strict I&O  ? ?Acute renal failure - improving  ?Hypokalemia - resolved ?Monitor BMET and UOP ?Replace  electrolytes as needed ? ?Shock liver vs HLH effect - stable ?Trend LFTs ? ?Hyperglycemia > improved after adding tube feeding coverage ?Continue SSI ?Continue tube feeding coverage ? ?Possible seizure ?Continue keppra  ? ?Hx of dermatomyositis ?Rheumatoid arthritis ?Chronic steroid use - at risk for adrenal insufficiency ?CK wnl. Has been on high dose steroids and has received pulse dose steroids over the past 5 days. High risk for adrenal insufficiency ?- IV dexamethasone $RemoveBeforeDE'1mg'ZvTVbQocYVndiuN$  daily; taper  ? ?Best Practice (right click and "Reselect all SmartList Selections" daily)  ? ?Diet/type: tubefeeds ?DVT prophylaxis: SCD ?GI prophylaxis: PPI ?Lines: see above ?Foley:  N/A and removal ordered  ?Code Status:  full code ?Last date of multidisciplinary goals of care discussion [family updated at bedside] ? ?Labs   ?CBC: ?Recent Labs  ?Lab 08/28/21 ?5631 08/29/21 ?0414 08/30/21 ?4970 08/31/21 ?2637 09/01/21 ?0348  ?WBC 7.5 6.7 6.0 5.4 5.9  ?NEUTROABS  --   --   --   --  5.3  ?HGB 7.2* 6.5* 8.7* 8.2* 6.9*  ?HCT 21.6* 19.1* 26.0* 25.2* 20.4*  ?MCV 88.2 88.8 90.9 90.6 91.9  ?PLT 39* 37* 38* 27* 27*  ? ? ?Basic Metabolic Panel: ?Recent Labs  ?Lab 08/28/21 ?8588 08/29/21 ?0414 08/30/21 ?5027 08/31/21 ?7412 09/01/21 ?0348  ?NA 139 140 141 143 144  ?K 3.4* 3.7 4.4 3.8 3.5  ?CL 105 109  109 112* 113*  ?CO2 $Rem'25 27 27 27 24  'PuMu$ ?GLUCOSE 185* 139* 262* 158* 152*  ?BUN 43* 58* 62* 71* 87*  ?CREATININE 1.78* 1.52* 1.50* 1.31* 1.30*  ?CALCIUM 7.4* 7.4* 7.8* 8.6* 8.6*  ? ?GFR: ?Estimated Creatinine Clearance: 42.1 mL/min (A) (by C-G formula based on SCr of 1.3 mg/dL (H)). ?Recent Labs  ?Lab 08/29/21 ?0414 08/30/21 ?3888 08/31/21 ?2800 09/01/21 ?0348  ?WBC 6.7 6.0 5.4 5.9  ? ? ?Liver Function Tests: ?Recent Labs  ?Lab 08/28/21 ?3491 08/29/21 ?0414 08/30/21 ?7915 08/31/21 ?0569 09/01/21 ?0348  ?AST 140* 121* 130* 109* 96*  ?ALT 53* 52* 59* 57* 50*  ?ALKPHOS 894* 794* 744* 630* 465*  ?BILITOT 2.3* 1.3* 1.6* 1.4* 1.1  ?PROT 4.6* 4.8* 5.0* 5.1* 4.4*   ?ALBUMIN 1.5* <1.5* 1.5* 1.7* <1.5*  ? ?No results for input(s): LIPASE, AMYLASE in the last 168 hours. ?No results for input(s): AMMONIA in the last 168 hours. ? ?ABG ?   ?Component Value Date/Time  ? PHART

## 2021-09-01 NOTE — Progress Notes (Addendum)
HEMATOLOGY-ONCOLOGY PROGRESS NOTE ? ?ASSESSMENT AND PLAN: ?61 yo female  ?  ?Acute encephalopathy, ? 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  ?Recommendations: ?-Labs from this morning have been reviewed.  WBC remains normal, hemoglobin 6.9, and platelets 27,000.  Received 1 unit PRBCs today.  Transfuse PRBCs per ICU protocol.  Recommend platelet transfusion for platelet count less than 20,000 or active bleeding. ?-Monitor daily CBC, CMP, ferritin, fibrinogen levels. ?-Status post 1 dose of etoposide given on 08/29/2021.  Her second dose was due today.  But due to development of Candida glabrata fungemia, will place chemotherapy on hold.  Additionally, dexamethasone is also on hold.  We will consider resuming chemotherapy later this week pending repeat blood cultures and goals of care discussion. ?-Recommend cryo for fibrinogen less than 100. ?-Agree with palliative care consult and goals of care discussion.  As explained to the patient's family last week, she is critically ill and the possibility of death is very high even with treatment for [place]. ? ?Mikey Bussing, DNP, AGPCNP-BC, AOCNP ? ?SUBJECTIVE: Remains on the ventilator.  No bleeding reported.  Continues to have fevers.  No family at the bedside. ? ? ?REVIEW OF SYSTEMS:   ?Review of Systems  ?Reason unable to perform ROS: Sedated, on ventilator.  ? ?PHYSICAL EXAMINATION: ?ECOG PERFORMANCE STATUS: 4 - Bedbound ? ?Vitals:  ? 09/01/21 1155 09/01/21 1200  ?BP:  102/74  ?Pulse:  81  ?Resp:  20  ?Temp: 99.4 ?F (37.4 ?C)   ?SpO2:  97%  ? ?Filed Weights  ? 08/28/21 0356 08/29/21 0500 08/30/21 0500  ?Weight: 70.4 kg 69.2 kg 70 kg  ? ? ?Intake/Output  from previous day: ?03/26 0701 - 03/27 0700 ?In: 2584.5 [I.V.:1102; Blood:311.3; NG/GT:1050; IV Piggyback:121.2] ?Out: 1935 [id number] ? ?Physical Exam ?Vitals reviewed.  ?Constitutional:   ?   Comments: Sedated, on ventilator  ?HENT:  ?   Head: Normocephalic.  ?Musculoskeletal:  ?   Right lower leg: Edema present.  ?   Left lower leg: Edema present.  ?Skin: ?   General: Skin is warm and dry.  ?Neurological:  ?   Comments: Sedated.  ? ? ?LABORATORY DATA:  ?I have reviewed the data as listed ? ?  Latest Ref Rng & Units 09/01/2021  ?  3:48 AM 08/31/2021  ?  6:39 AM 08/30/2021  ?  5:56 AM  ?CMP  ?Glucose 70 - 99 mg/dL 152   158   262    ?BUN 8 - 23 mg/dL 87   71   62    ?Creatinine 0.44 - 1.00 mg/dL 1.30   1.31   1.50    ?Sodium 135 - 145 mmol/L 144   143   141    ?Potassium 3.5 - 5.1 mmol/L 3.5   3.8   4.4    ?Chloride 98 - 111 mmol/L 113   112   109    ?CO2 22 - 32 mmol/L 24   27   27     ?Calcium 8.9 - 10.3 mg/dL 8.6   8.6   7.8    ?Total Protein 6.5 - 8.1 g/dL 4.4   5.1   5.0    ?Total Bilirubin 0.3 - 1.2 mg/dL 1.1   1.4   1.6    ?Alkaline Phos 38 -  126 U/L 465   630   744    ?AST 15 - 41 U/L 96   109   130    ?ALT 0 - 44 U/L 50   57   59    ? ? ?Lab Results  ?Component Value Date  ? WBC 5.9 09/01/2021  ? HGB 6.9 (LL) 09/01/2021  ? HCT 20.4 (L) 09/01/2021  ? MCV 91.9 09/01/2021  ? PLT 27 (LL) 09/01/2021  ? NEUTROABS 5.3 09/01/2021  ? ? ?Lab Results  ?Component Value Date  ? CEA1 36.4 (H) 08/28/2021  ? ? ?DG Chest 1 View ? ?Result Date: 08/06/2021 ?CLINICAL DATA:  Dizziness, nausea and vomiting, bilateral leg pain EXAM: CHEST  1 VIEW COMPARISON:  04/16/2021 FINDINGS: Single frontal view of the chest demonstrates an unremarkable cardiac silhouette. No acute airspace disease, effusion, or pneumothorax. No acute bony abnormalities. IMPRESSION: 1. No acute intrathoracic process. Electronically Signed   By: Randa Ngo M.D.   On: 08/06/2021 19:41  ? ?CT HEAD WO CONTRAST (5MM) ? ?Result Date: 08/25/2021 ?CLINICAL DATA:   61 year old female with history of delirium. Lower abdominal pain and constipation. EXAM: CT HEAD WITHOUT CONTRAST TECHNIQUE: Contiguous axial images were obtained from the base of the skull through the vertex without intravenous contrast. RADIATION DOSE REDUCTION: This exam was performed according to the departmental dose-optimization program which includes automated exposure control, adjustment of the mA and/or kV according to patient size and/or use of iterative reconstruction technique. COMPARISON:  Head CT 08/06/2021. FINDINGS: Brain: Mild cerebral atrophy. Patchy and confluent areas of decreased attenuation are noted throughout the deep and periventricular white matter of the cerebral hemispheres bilaterally, compatible with chronic microvascular ischemic disease. No evidence of acute infarction, hemorrhage, hydrocephalus, extra-axial collection or mass lesion/mass effect. Vascular: No hyperdense vessel or unexpected calcification. Skull: Normal. Negative for fracture or focal lesion. Sinuses/Orbits: No acute finding. Other: None. IMPRESSION: 1. No acute intracranial abnormalities. 2. Mild cerebral atrophy with chronic microvascular ischemic changes in the cerebral white matter, as above. Electronically Signed   By: Vinnie Langton M.D.   On: 08/29/2021 06:31  ? ?CT Head Wo Contrast ? ?Result Date: 08/06/2021 ?CLINICAL DATA:  Dizziness, nausea and vomiting EXAM: CT HEAD WITHOUT CONTRAST TECHNIQUE: Contiguous axial images were obtained from the base of the skull through the vertex without intravenous contrast. RADIATION DOSE REDUCTION: This exam was performed according to the departmental dose-optimization program which includes automated exposure control, adjustment of the mA and/or kV according to patient size and/or use of iterative reconstruction technique. COMPARISON:  03/26/2021, 04/17/2021 FINDINGS: Brain: No acute infarct or hemorrhage. Lateral ventricles and midline structures are stable. No acute  extra-axial fluid collections. No mass effect. Vascular: No hyperdense vessel or unexpected calcification. Skull: Normal. Negative for fracture or focal lesion. Sinuses/Orbits: No acute finding. Other: None. IMPRESSION: 1. No acute intracranial process. Electronically Signed   By: Randa Ngo M.D.   On: 08/06/2021 19:40  ? ?MR BRAIN WO CONTRAST ? ?Result Date: 08/18/2021 ?CLINICAL DATA:  Delirium.  Unable to follow commands EXAM: MRI HEAD WITHOUT CONTRAST TECHNIQUE: Multiplanar, multiecho pulse sequences of the brain and surrounding structures were obtained without intravenous contrast. COMPARISON:  Head CT from yesterday 04/17/2021 brain MRI FINDINGS: Brain: No acute infarction, hemorrhage, hydrocephalus, extra-axial collection or mass lesion. Cerebral volume loss without specific pattern. Vascular: Normal flow voids. Skull and upper cervical spine: Normal marrow signal. Sinuses/Orbits: Bilateral cataract resection. Minor mastoid opacification on the left more than right. Negative nasopharynx. Other: Motion degraded study  requiring fast brain protocol. IMPRESSION: Motion degraded brain MRI without acute finding. No change since November 2022. Electronically Signed   By: Jorje Guild M.D.   On: 08/18/2021 11:46  ? ?US PELVIS (TRANSABDOMINAL ONLY) ? ?Result Date: 08/28/2021 ?CLINICAL DATA:  Evaluate for ovarian mass EXAM: TRANSABDOMINAL ULTRASOUND OF PELVIS TECHNIQUE: Transabdominal ultrasound examination of the pelvis was performed including evaluation of the uterus, ovaries, adnexal regions, and pelvic cul-de-sac. COMPARISON:  CT done on 08/26/2021 FINDINGS: Uterus Uterus is not seen consistent with hysterectomy. Right ovary Not sonographically visualized. Left ovary Not sonographically visualized. Other findings:  No abnormal free fluid. IMPRESSION: Status post hysterectomy. Ovaries are not sonographically visualized. There are no dominant adnexal masses. There is no free fluid in the pelvis. Electronically  Signed   By: Elmer Picker M.D.   On: 08/28/2021 13:01  ? ?CT ABDOMEN PELVIS W CONTRAST ? ?Result Date: 08/30/2021 ?CLINICAL DATA:  61 year old female with history of acute onset of nonlocalized abdominal

## 2021-09-01 NOTE — Procedures (Signed)
Cortrak  Person Inserting Tube:  Yeslin Delio D, RD Tube Type:  Cortrak - 43 inches Tube Size:  10 Tube Location:  Right nare Secured by: Bridle Technique Used to Measure Tube Placement:  Marking at nare/corner of mouth Cortrak Secured At:  64 cm  Cortrak Tube Team Note:  Consult received to place a Cortrak feeding tube.   X-ray is required, abdominal x-ray has been ordered by the Cortrak team. Please confirm tube placement before using the Cortrak tube.   If the tube becomes dislodged please keep the tube and contact the Cortrak team at [email or web address] ([email or web address]) for replacement.  If after hours and replacement cannot be delayed, place a NG tube and confirm placement with an abdominal x-ray.    Tauno Falotico, RD, LDN Clinical Dietitian RD pager # available in AMION  After hours/weekend pager # available in AMION   

## 2021-09-01 NOTE — Progress Notes (Signed)
Pt RR in 40s, BP 180s, tachycardic, WOB increased on max dose precedex and fentanyl .PRN versed given.  ?

## 2021-09-01 NOTE — Progress Notes (Signed)
?   ? ?[facility] ? ?Date of Admission:  09/05/2021    ?       ?Reason for visit: Follow up on fungemia ? ?Current antibiotics: ?Micafungin 3/24-- present ? ?ASSESSMENT:   ? ?61 y.o. female admitted with: ? ?Candida glabrata fungemia: Blood cultures positive 3/23 in 1 out of 4 bottles and repeat blood cultures 3/26 no growth to date.  PICC line was removed 3/25. 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 ?Acute hypoxic respiratory failure: Remains on mechanical ventilation. ?Anemia and thrombocytopenia ?Acute kidney injury ?Elevated LFTs ?Rheumatoid arthritis/Dermatomyositis ?Possible seizure activity ? ?RECOMMENDATIONS:   ? ?Continue micafungin ?Follow-up repeat blood cultures and sensitivities ?Continue line holiday as hemodynamics allow until repeat blood cultures finalized no growth ?Holding [place] therapy in the setting of candidemia for now ?Can hold off TTE given presumed source related to PICC line now removed and prior echo completed within the last week ?Will follow ? ? ?Principal Problem: ?  Lactic acidosis ?Active Problems: ?  Hyperlipidemia ?  Hypertension ?  Generalized weakness ?  Rheumatoid arthritis flare (HCC) ?  Depression with anxiety ?  Pancytopenia ([place]) ?  Volume depletion ?  Long-term corticosteroid use ?  Open wound of right hand ?  Adrenal nodule ([place]) ?  Pressure injury of skin ?  High anion gap metabolic acidosis ?  Altered mental status ?  Metabolic encephalopathy ?  Encephalitis ?  Acute encephalopathy ?  [place] (hemophagocytic lymphohistiocytosis) ([place]) ? ? ? ?MEDICATIONS:   ? ?Scheduled Meds: ?? sodium chloride   Intravenous Once  ?? sodium chloride   Intravenous Once  ?? ARIPiprazole  5 mg Per Tube QHS  ?? chlorhexidine gluconate (MEDLINE KIT)  15 mL Mouth Rinse BID  ?? Chlorhexidine Gluconate Cloth  6 each Topical Q0600  ?? clonazepam  0.5 mg Per Tube BID  ?? dexamethasone (DECADRON) injection  1 mg Intravenous  Q24H  ?? escitalopram  20 mg Per Tube Daily  ?? feeding supplement (PROSource TF)  45 mL Per Tube Daily  ?? folic acid  1 mg Per Tube Daily  ?? furosemide  80 mg Intravenous Q12H  ?? insulin aspart  0-15 Units Subcutaneous Q4H  ?? insulin aspart  8 Units Subcutaneous Q4H  ?? lactulose  20 g Per Tube BID  ?? levETIRAcetam  500 mg Per Tube BID  ?? mouth rinse  15 mL Mouth Rinse 10 times per day  ?? metoprolol tartrate  12.5 mg Per Tube BID  ?? midazolam      ?? multivitamin with minerals  1 tablet Per Tube Daily  ?? mupirocin ointment   Topical BID  ?? nutrition supplement (JUVEN)  1 packet Per Tube BID BM  ?? pantoprazole sodium  40 mg Per Tube Daily  ?? senna-docusate  2 tablet Per Tube QHS  ?? sodium chloride flush  10-40 mL Intracatheter Q12H  ?? thiamine  100 mg Per Tube Daily  ? ?Continuous Infusions: ?? sodium chloride 250 mL (08/30/21 2121)  ?? sodium chloride    ?? dexmedetomidine (PRECEDEX) IV infusion 1.2 mcg/kg/hr (09/01/21 0750)  ?? feeding supplement (VITAL AF 1.2 CAL) 1,000 mL (08/31/21 1912)  ?? fentaNYL infusion INTRAVENOUS 200 mcg/hr (09/01/21 0700)  ?? micafungin [facility]) IV Stopped (08/31/21 2338)  ? ?PRN Meds:.acetaminophen (TYLENOL) oral liquid 160 mg/5 mL **OR** acetaminophen **OR** acetaminophen, alteplase, fentaNYL, heparin lock flush, heparin lock flush, ipratropium-albuterol, lip balm, midazolam, prochlorperazine, sodium chloride flush, sodium chloride flush, sodium  chloride flush ? ?SUBJECTIVE:  ? ?24 hour events:  ?Patient remains intubated and sedated ?Repeat blood cultures are no growth ?Hemoglobin 6.9 ?Tmax 102 ?F ?Family at the bedside ? ? ?Review of Systems  ?Unable to perform ROS: Intubated  ? ?  ?OBJECTIVE:  ? ?Blood pressure 125/86, pulse 90, temperature 99.6 ?F (37.6 ?C), temperature source Axillary, resp. rate (!) 23, height 5' 3" (1.6 m), weight 70 kg, SpO2 99 %. ?Body mass index is 27.34 kg/m?. ? ?Physical Exam ?Constitutional:   ?   Comments: Patient is intubated and sedated  on the ventilator.  ?HENT:  ?   Head: Normocephalic and atraumatic.  ?   Comments: ET tube in place. ?Pulmonary:  ?   Comments: Symmetric chest rise and fall. ?Abdominal:  ?   General: There is no distension.  ?   Palpations: Abdomen is soft.  ?   Tenderness: There is no abdominal tenderness.  ?Musculoskeletal:     ?   General: Swelling present.  ?   Cervical back: Normal range of motion and neck supple.  ?Skin: ?   General: Skin is warm and dry.  ?   Findings: No rash.  ?Neurological:  ?   Comments: Sedated  ?Psychiatric:  ?   Comments: Unable to assess  ? ? ? ?Lab Results: ?Lab Results  ?Component Value Date  ? WBC 5.9 09/01/2021  ? HGB 6.9 (LL) 09/01/2021  ? HCT 20.4 (L) 09/01/2021  ? MCV 91.9 09/01/2021  ? PLT 27 (LL) 09/01/2021  ?  ?Lab Results  ?Component Value Date  ? NA 144 09/01/2021  ? K 3.5 09/01/2021  ? CO2 24 09/01/2021  ? GLUCOSE 152 (H) 09/01/2021  ? BUN 87 (H) 09/01/2021  ? CREATININE 1.30 (H) 09/01/2021  ? CALCIUM 8.6 (L) 09/01/2021  ? GFRNONAA 46 (L) 09/01/2021  ? GFRAA  08/24/2008  ?  >60        ?The eGFR has been calculated ?using the MDRD equation. ?This calculation has not been ?validated in all clinical ?situations. ?eGFR's persistently ?<60 mL/min signify ?possible Chronic Kidney Disease.  ?  ?Lab Results  ?Component Value Date  ? ALT 50 (H) 09/01/2021  ? AST 96 (H) 09/01/2021  ? ALKPHOS 465 (H) 09/01/2021  ? BILITOT 1.1 09/01/2021  ? ? ?   ?Component Value Date/Time  ? CRP 19.4 (H) 08/26/2021 1134  ? ? ?   ?Component Value Date/Time  ? ESRSEDRATE 6 08/26/2021 1134  ? ?  ?I have reviewed the micro and lab results in Epic. ? ?Imaging: ?DG CHEST PORT 1 VIEW ? ?Result Date: 08/30/2021 ?CLINICAL DATA:  Acute respiratory failure with hypoxemia EXAM: PORTABLE CHEST 1 VIEW COMPARISON:  Radiograph 08/24/2021 FINDINGS: Right upper extremity PICC tip overlies the mid superior vena cava. Endotracheal tube overlies the upper trachea near the thoracic inlet. This has been retracted since the prior exam.  Orogastric tube passes below the diaphragm, tip excluded by collimation. Unchanged cardiomediastinal silhouette. There is diffuse airspace disease, increased throughout the left lung in comparison to prior exam. Pleural trace effusions. No visible pneumothorax. There is no acute osseous abnormality. IMPRESSION: Endotracheal tube tip has been retracted since the prior exam, tip near the thoracic inlet. Diffuse airspace disease, worsening throughout the left lung in comparison to prior exam. Electronically Signed   By: Maurine Simmering M.D.   On: 08/30/2021 15:07    ? ?Imaging independently reviewed in Epic.  ? ? ?Mignon Pine ?[facility] ?[facility] ?[phone] pager ?09/01/2021, 10:46 AM ? ?I have personally spent 50 minutes involved in face-to-face and non-face-to-face activities for this patient on the day of the visit. Professional time spent includes the following activities: Preparing to see the patient (review of tests), Obtaining and/or reviewing separately obtained history (admission/discharge record), Performing a medically appropriate examination and/or evaluation , Ordering medications/tests/procedures, referring and communicating with other health care professionals, Documenting clinical information in the EMR, Independently interpreting results (not separately reported), Communicating results to the patient/family/caregiver, Counseling and educating the patient/family/caregiver and Care coordination (not separately reported).  ? ? ?

## 2021-09-01 NOTE — Consult Note (Signed)
? ?                                                                                ?Consultation Note ?Date: 09/01/2021  ? ?Patient Name: RAJVI ARMENTOR  ?DOB: Dec 11, 1960  MRN: [id number]  Age / Sex: 61 y.o., female  ?PCP: Ann Held, DO ?Referring Physician: Kipp Brood, MD ? ?Reason for Consultation: Establishing goals of care ? ?HPI/Patient Profile: 61 y.o. female  with past medical history of depression with anxiety, dermatomyositis, fatty liver disease, hyperlipidemia, hypertension, nontraumatic rhabdomyolysis, presyncope, history of rheumatoid arthritis on hydroxychloroquine, mycophenolate and daily prednisone admitted on 08/19/2021 with to altered mental status, abdominal pain, constipation for several days, decreased oral intake with dehydration. Patient has presented to ED 3 times in the past 2 weeks for similar presentation, admitted 3 times in the past 6 months. ? ?Patient has poor prognosis with complicated illness - has developed fungemia which interferes with treatment of [place]. PMT has been consulted to assist with goals of care conversation. ? ?Clinical Assessment and Goals of Care: ? ?I have reviewed medical records including EPIC notes, labs and imaging, received report from RN, assessed the patient and then met at the bedside along with her niece and sister to discuss diagnosis, prognosis, GOC, EOL wishes, disposition and options. ? ?I introduced Palliative Medicine as specialized medical care for people living with serious illness. It focuses on providing relief from the symptoms and stress of a serious illness. The goal is to improve quality of life for both the patient and the family. ? ?We discussed a brief life review of the patient and then focused on their current illness.  ? ?Medical History Review and Understanding: ?We reviewed patient's worsening health since a fall that lead to hospitalization for several weeks in October 2022. This is when patient was diagnosed with  dermatomyositis, rheumatoid arthritis, depression. Discussed current illness and tenuous condition, including HLH, multi-organ system failure, and fungemia. Family reports she is also autistic. Family has a good understand of the severity of Von's illness. ? ?Social History: ?Kadie retired 1 year ago from 30+ year career working with [employer]. This was difficult on her, as it became an environment in which she felt forced out of her role. She has one son who is incarcerated. She lived alone until her nephew moved in to help her just before this admission. She enjoys shopping and spending time with her grandkids. She previously attended church but less often recently, possibly due to depression. ? ?Functional and Nutritional State: ?Patient initially recovered well in the month after October 2022 hospitalization, she was able to walk again unassisted. She has declined since then and family was carrying her due to her inability to walk. Her appetite has fluctuated greatly over this time, with a rapid decline in function and nutrition over the past 2 weeks.  ? ?Advance Directives: ?A detailed discussion regarding advanced directives was had. Patient's son is primary HCPOA and niece Bonnita Nasuti is secondary HCPOA. ? ?  ?Code Status: ?Concepts specific to code status, artifical feeding and hydration, and rehospitalization were considered and discussed. ? ?Discussion: ?Patient's family reports she is a strong-willed and hardworking woman. She is  vocal, tough, and a Nurse, adult. She has mentioned in the past she does not want SNF/rehab. Patient's family is very hopeful and praying for a recovery for the patient. Their faith is very important to them. They also understand anything can happen at any time. We discussed the best case and worst case scenarios, reviewing high likelihood of dependency on 24/7 care, recurrent hospitalizations and further complications with a long road to recovery. We discussed whether this is a quality of  life that Caitlain would find acceptable. Patient's son wishes for FULL code status after discussing with niece. Family wishes to continue current care for another 48 hours to allow time for response to antifungals, agree to meet again to continue the conversation. ? ? ?The difference between aggressive medical intervention and comfort care was considered in light of the patient's goals of care. Hospice and Palliative Care services outpatient were explained.  ? ?Discussed the importance of continued conversation with family and the medical providers regarding overall plan of care and treatment options, ensuring decisions are within the context of the patient?s values and GOCs.  ? ?Questions and concerns were addressed.  Hard Choices booklet left for review. The family was encouraged to call with questions or concerns.  PMT will continue to support holistically.  ? ?  ? ?SUMMARY OF RECOMMENDATIONS   ?-Full code/full scope treatment ?-Watchful waiting for 48 hours, family is hopeful for improvement ?-Family meeting scheduled for Wednesday 3/29 at 11am with patient's son included via telephone ?-Psychosocial and emotional support provided ?-PMT will continue to follow ? ? ?Prognosis:  ?Guarded ? ?Discharge Planning: To Be Determined  ? ?  ? ?Primary Diagnoses: ?Present on Admission: ? Lactic acidosis ? Pancytopenia ([place]) ? Hyperlipidemia ? Hypertension ? Depression with anxiety ? Rheumatoid arthritis flare (HCC) ? Volume depletion ? Long-term corticosteroid use ? Open wound of right hand ? Adrenal nodule ([place]) ? Pressure injury of skin ? High anion gap metabolic acidosis ? ? ?I have reviewed the medical record, interviewed the patient and family, and examined the patient. The following aspects are pertinent. ? ?Past Medical History:  ?Diagnosis Date  ? Depression with anxiety 05/26/2021  ? Dermatomyositis ([place]) 06/23/2021  ? Fatty liver 06/23/2021  ? Hyperlipidemia   ? Hypertension   ? Non-traumatic rhabdomyolysis   ?  Pre-syncope 03/26/2021  ? Rheumatoid arthritis flare ([place]) 04/08/2021  ? ?Social History  ? ?Socioeconomic History  ? Marital status: Single  ?  Spouse name: Not on file  ? Number of children: Not on file  ? Years of education: Not on file  ? Highest education level: Not on file  ?Occupational History  ? Not on file  ?Tobacco Use  ? Smoking status: Former  ?  Types: Cigarettes  ?  Quit date: 02/05/2014  ?  Years since quitting: 7.5  ? Smokeless tobacco: Never  ? Tobacco comments:  ?  1 cig every 4-5 days ---weaning off  ?Vaping Use  ? Vaping Use: Unknown  ?Substance and Sexual Activity  ? Alcohol use: Not Currently  ? Drug use: No  ? Sexual activity: Not Currently  ?  Partners: Male  ?Other Topics Concern  ? Not on file  ?Social History Narrative  ? Exercise --rare  ? ?Social Determinants of Health  ? ?Financial Resource Strain: Not on file  ?Food Insecurity: Not on file  ?Transportation Needs: Not on file  ?Physical Activity: Not on file  ?Stress: Not on file  ?Social Connections: Not on file  ? ?Family  History  ?Problem Relation Age of Onset  ? Breast cancer Mother   ?     deceased by 17  ? Breast cancer Sister 38  ? Diabetes Sister   ? Breast cancer Other   ? Coronary artery disease Other   ? Hypertension Other   ? ?Scheduled Meds: ? sodium chloride   Intravenous Once  ? sodium chloride   Intravenous Once  ? ARIPiprazole  5 mg Per Tube QHS  ? chlorhexidine gluconate (MEDLINE KIT)  15 mL Mouth Rinse BID  ? Chlorhexidine Gluconate Cloth  6 each Topical Q0600  ? clonazepam  0.5 mg Per Tube BID  ? dexamethasone (DECADRON) injection  1 mg Intravenous Q24H  ? escitalopram  20 mg Per Tube Daily  ? feeding supplement (PROSource TF)  45 mL Per Tube Daily  ? folic acid  1 mg Per Tube Daily  ? furosemide  80 mg Intravenous Q12H  ? insulin aspart  0-15 Units Subcutaneous Q4H  ? insulin aspart  8 Units Subcutaneous Q4H  ? lactulose  20 g Per Tube BID  ? levETIRAcetam  500 mg Per Tube BID  ? mouth rinse  15 mL Mouth Rinse 10  times per day  ? metoprolol tartrate  12.5 mg Per Tube BID  ? midazolam      ? multivitamin with minerals  1 tablet Per Tube Daily  ? mupirocin ointment   Topical BID  ? nutrition supplement (JUVEN)  1 packet Per Tube BI

## 2021-09-01 NOTE — Progress Notes (Signed)
SLP Cancellation Note ? ?Patient Details ?Name:  S  ?MRN: [name] ?DOB: 11/14/1960 ? ? ?Cancelled treatment:       Reason Eval/Treat Not Completed: Patient not medically ready. Pt remains intubated, will sign off at this time.  ? ? ?Sallie Maker, Riley NearingBonnie Caroline ?09/01/2021, 8:04 AM ?

## 2021-09-02 ENCOUNTER — Inpatient Hospital Stay (HOSPITAL_COMMUNITY): Payer: Federal, State, Local not specified - PPO

## 2021-09-02 DIAGNOSIS — D701 Agranulocytosis secondary to cancer chemotherapy: Secondary | ICD-10-CM | POA: Diagnosis not present

## 2021-09-02 DIAGNOSIS — B377 Candidal sepsis: Secondary | ICD-10-CM | POA: Diagnosis not present

## 2021-09-02 DIAGNOSIS — E872 Acidosis, unspecified: Secondary | ICD-10-CM | POA: Diagnosis not present

## 2021-09-02 DIAGNOSIS — D61818 Other pancytopenia: Secondary | ICD-10-CM | POA: Diagnosis not present

## 2021-09-02 DIAGNOSIS — G9340 Encephalopathy, unspecified: Secondary | ICD-10-CM | POA: Diagnosis not present

## 2021-09-02 DIAGNOSIS — E278 Other specified disorders of adrenal gland: Secondary | ICD-10-CM | POA: Diagnosis not present

## 2021-09-02 DIAGNOSIS — T451X5A Adverse effect of antineoplastic and immunosuppressive drugs, initial encounter: Secondary | ICD-10-CM

## 2021-09-02 DIAGNOSIS — D761 Hemophagocytic lymphohistiocytosis: Secondary | ICD-10-CM | POA: Diagnosis not present

## 2021-09-02 DIAGNOSIS — F418 Other specified anxiety disorders: Secondary | ICD-10-CM | POA: Diagnosis not present

## 2021-09-02 LAB — BPAM RBC
Blood Product Expiration Date: 202304192359
Blood Product Expiration Date: 202304202359
ISSUE DATE / TIME: 202303240801
ISSUE DATE / TIME: 202303270439
Unit Type and Rh: 600
Unit Type and Rh: 600

## 2021-09-02 LAB — COMPREHENSIVE METABOLIC PANEL
ALT: 52 U/L — ABNORMAL HIGH (ref 0–44)
AST: 111 U/L — ABNORMAL HIGH (ref 15–41)
Albumin: 1.6 g/dL — ABNORMAL LOW (ref 3.5–5.0)
Alkaline Phosphatase: 415 U/L — ABNORMAL HIGH (ref 38–126)
Anion gap: 5 (ref 5–15)
BUN: 90 mg/dL — ABNORMAL HIGH (ref 8–23)
CO2: 29 mmol/L (ref 22–32)
Calcium: 8.8 mg/dL — ABNORMAL LOW (ref 8.9–10.3)
Chloride: 111 mmol/L (ref 98–111)
Creatinine, Ser: 1.26 mg/dL — ABNORMAL HIGH (ref 0.44–1.00)
GFR, Estimated: 48 mL/min — ABNORMAL LOW (ref >60)
Glucose, Bld: 130 mg/dL — ABNORMAL HIGH (ref 70–99)
Potassium: 4.1 mmol/L (ref 3.5–5.1)
Sodium: 145 mmol/L (ref 135–145)
Total Bilirubin: 1.1 mg/dL (ref 0.3–1.2)
Total Protein: 4.7 g/dL — ABNORMAL LOW (ref 6.5–8.1)

## 2021-09-02 LAB — TYPE AND SCREEN
ABO/RH(D): A NEG
Antibody Screen: NEGATIVE
Unit division: 0
Unit division: 0

## 2021-09-02 LAB — CBC WITH DIFFERENTIAL/PLATELET
Abs Immature Granulocytes: 0.07 10*3/uL (ref 0.00–0.07)
Basophils Absolute: 0 10*3/uL (ref 0.0–0.1)
Basophils Relative: 2 %
Eosinophils Absolute: 0 10*3/uL (ref 0.0–0.5)
Eosinophils Relative: 0 %
HCT: 24.4 % — ABNORMAL LOW (ref 36.0–46.0)
Hemoglobin: 7.8 g/dL — ABNORMAL LOW (ref 12.0–15.0)
Immature Granulocytes: 5 %
Lymphocytes Relative: 22 %
Lymphs Abs: 0.3 10*3/uL — ABNORMAL LOW (ref 0.7–4.0)
MCH: 28.1 pg (ref 26.0–34.0)
MCHC: 32 g/dL (ref 30.0–36.0)
MCV: 87.8 fL (ref 80.0–100.0)
Monocytes Absolute: 0 10*3/uL — ABNORMAL LOW (ref 0.1–1.0)
Monocytes Relative: 1 %
Neutro Abs: 0.9 10*3/uL — ABNORMAL LOW (ref 1.7–7.7)
Neutrophils Relative %: 70 %
Platelets: 30 10*3/uL — ABNORMAL LOW (ref 150–400)
RBC: 2.78 MIL/uL — ABNORMAL LOW (ref 3.87–5.11)
RDW: 22.5 % — ABNORMAL HIGH (ref 11.5–15.5)
WBC: 1.3 10*3/uL — CL (ref 4.0–10.5)
nRBC: 0 % (ref 0.0–0.2)

## 2021-09-02 LAB — CBC
HCT: 23.8 % — ABNORMAL LOW (ref 36.0–46.0)
Hemoglobin: 7.9 g/dL — ABNORMAL LOW (ref 12.0–15.0)
MCH: 28.7 pg (ref 26.0–34.0)
MCHC: 33.2 g/dL (ref 30.0–36.0)
MCV: 86.5 fL (ref 80.0–100.0)
Platelets: 31 10*3/uL — ABNORMAL LOW (ref 150–400)
RBC: 2.75 MIL/uL — ABNORMAL LOW (ref 3.87–5.11)
RDW: 22.5 % — ABNORMAL HIGH (ref 11.5–15.5)
WBC: 1.4 10*3/uL — CL (ref 4.0–10.5)
nRBC: 0 % (ref 0.0–0.2)

## 2021-09-02 LAB — GLUCOSE, CAPILLARY
Glucose-Capillary: 107 mg/dL — ABNORMAL HIGH (ref 70–99)
Glucose-Capillary: 147 mg/dL — ABNORMAL HIGH (ref 70–99)
Glucose-Capillary: 172 mg/dL — ABNORMAL HIGH (ref 70–99)
Glucose-Capillary: 197 mg/dL — ABNORMAL HIGH (ref 70–99)
Glucose-Capillary: 181 mg/dL — ABNORMAL HIGH (ref 70–99)
Glucose-Capillary: 165 mg/dL — ABNORMAL HIGH (ref 70–99)

## 2021-09-02 LAB — CULTURE, BLOOD (ROUTINE X 2)
Culture: NO GROWTH
Special Requests: ADEQUATE

## 2021-09-02 LAB — ALDOLASE: Aldolase: 20.4 U/L — ABNORMAL HIGH (ref 3.3–10.3)

## 2021-09-02 LAB — MAGNESIUM: Magnesium: 1.6 mg/dL — ABNORMAL LOW (ref 1.7–2.4)

## 2021-09-02 IMAGING — DX DG CHEST 1V PORT
1 series · 1 of 1 positions shown · non-contrast
Comparison: [DATE]

CLINICAL DATA: Acute respiratory failure with hypoxia.

EXAM:
PORTABLE CHEST 1 VIEW

[chest ap]
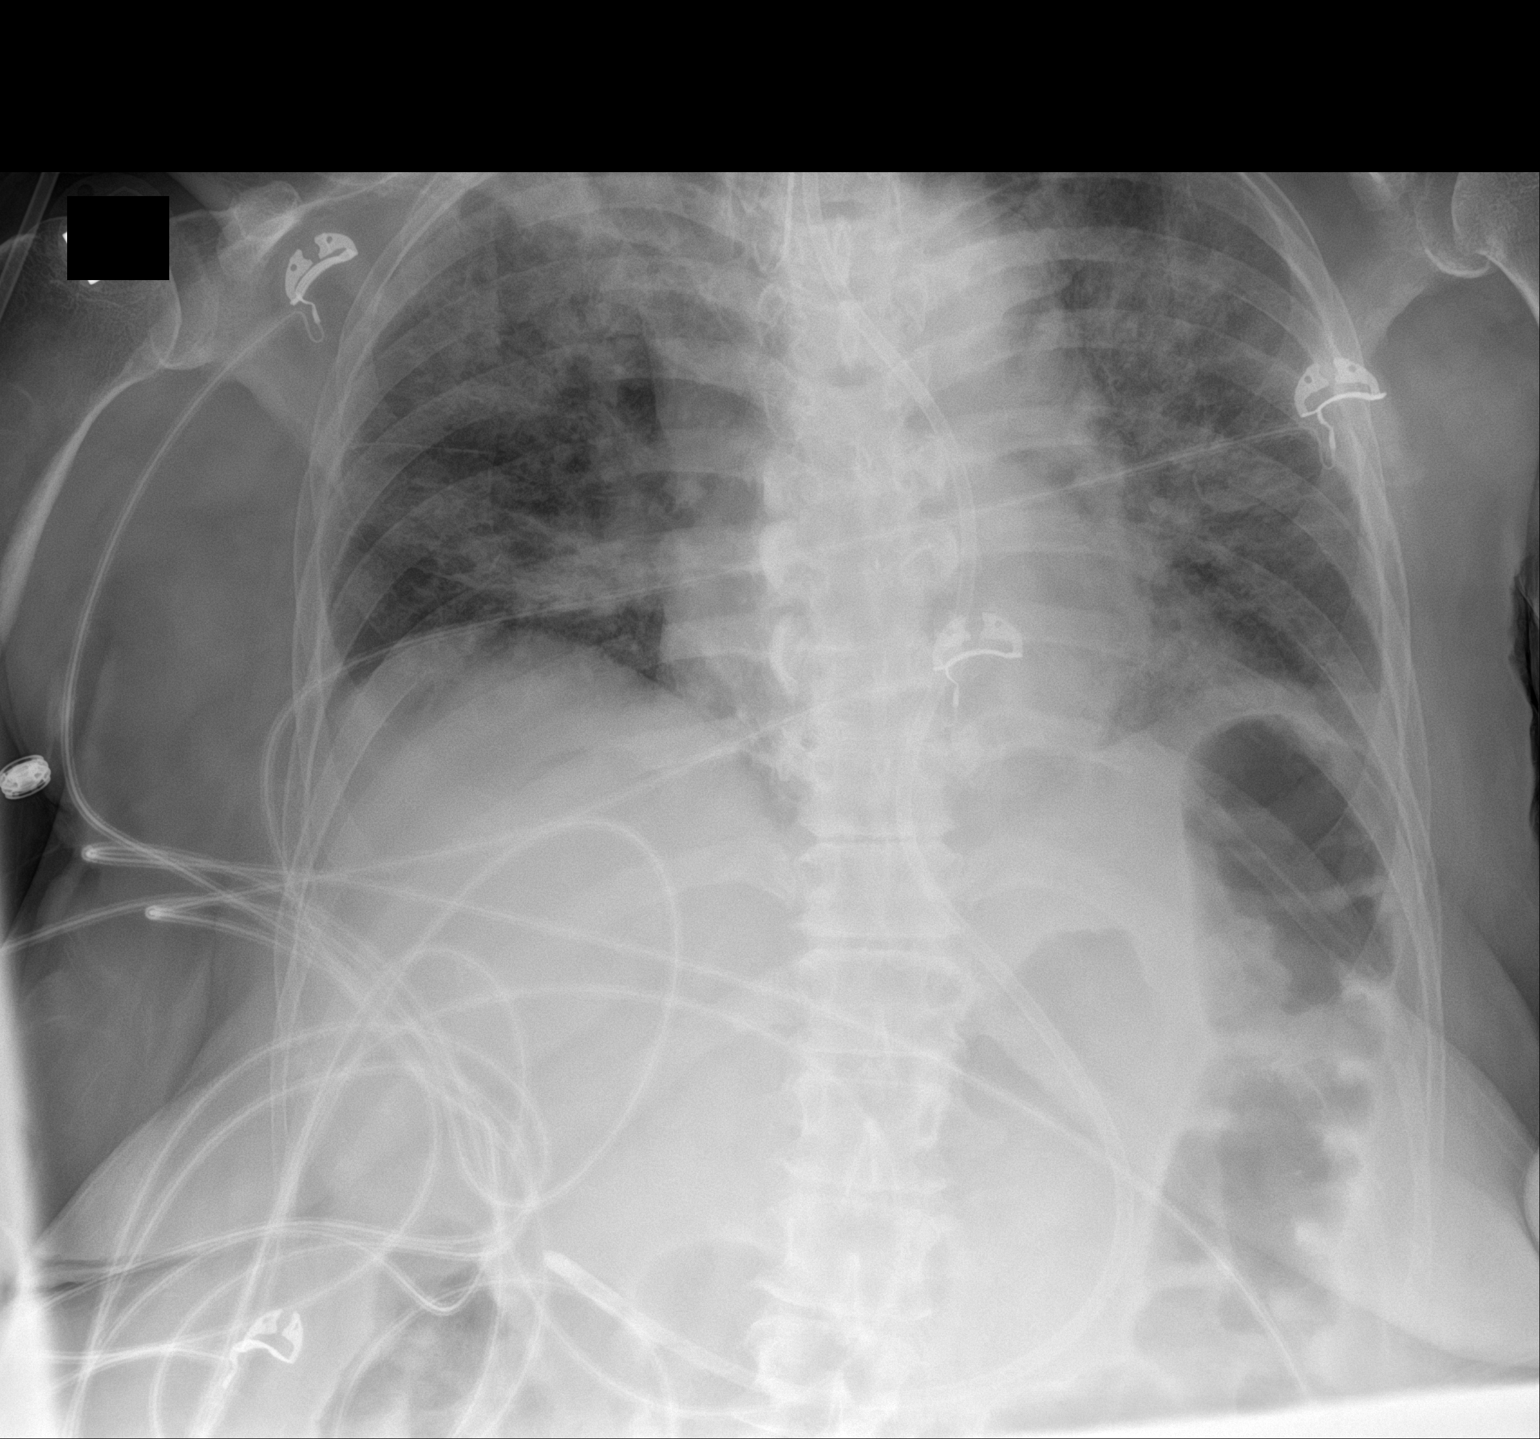

[1 of 1 positions shown; findings below may reference images not displayed]

FINDINGS: The endotracheal tube is 2.5 cm above the carina. The feeding tube
is coursing down the esophagus and into the stomach.

Persistent bilateral pulmonary infiltrates no pleural effusions or
pneumothorax.
IMPRESSION: 1. Support apparatus in good position.
2. Persistent bilateral pulmonary infiltrates.

## 2021-09-02 MED ORDER — IOHEXOL 9 MG/ML PO SOLN
ORAL | Status: AC
  Filled 2021-09-02: qty 500

## 2021-09-02 MED ORDER — MIDAZOLAM-SODIUM CHLORIDE 100-0.9 MG/100ML-% IV SOLN
0.5000 mg/h | INTRAVENOUS | Status: DC
  Administered 2021-09-02: 6 mg/h via INTRAVENOUS
  Administered 2021-09-02: 0.5 mg/h via INTRAVENOUS
  Administered 2021-09-03: 6 mg/h via INTRAVENOUS
  Administered 2021-09-04: 3.5 mg/h via INTRAVENOUS
  Filled 2021-09-02 (×4): qty 100

## 2021-09-02 MED ORDER — MAGNESIUM SULFATE 4 GM/100ML IV SOLN
4.0000 g | Freq: Once | INTRAVENOUS | Status: AC
  Administered 2021-09-02: 4 g via INTRAVENOUS
  Filled 2021-09-02: qty 100

## 2021-09-02 MED ORDER — CLONAZEPAM 0.25 MG PO TBDP
2.0000 mg | ORAL_TABLET | Freq: Two times a day (BID) | ORAL | Status: DC
  Administered 2021-09-02 – 2021-09-05 (×8): 2 mg
  Filled 2021-09-02 (×8): qty 8

## 2021-09-02 MED ORDER — NOREPINEPHRINE 4 MG/250ML-% IV SOLN
0.0000 ug/min | INTRAVENOUS | Status: DC
  Administered 2021-09-02: 2 ug/min via INTRAVENOUS
  Filled 2021-09-02: qty 250

## 2021-09-02 MED ORDER — SODIUM CHLORIDE 0.9 % IV SOLN
2.0000 g | Freq: Two times a day (BID) | INTRAVENOUS | Status: DC
  Administered 2021-09-02 – 2021-09-09 (×15): 2 g via INTRAVENOUS
  Filled 2021-09-02 (×15): qty 2

## 2021-09-02 MED ORDER — TBO-FILGRASTIM 300 MCG/0.5ML ~~LOC~~ SOSY
300.0000 ug | PREFILLED_SYRINGE | Freq: Every day | SUBCUTANEOUS | Status: AC
  Administered 2021-09-02 – 2021-09-03 (×2): 300 ug via SUBCUTANEOUS
  Filled 2021-09-02 (×2): qty 0.5

## 2021-09-02 NOTE — Progress Notes (Signed)
This chaplain responded to PMT consult for spiritual support and prayer. The Pt. is sedated and resting comfortably. The Pt. niece-[name] is a family presence at the bedside and receiving updates from the RN-Caroline. ? ?The chaplain listened reflectively as Carrie [name] described the Pt. spirituality(retirement) may have contributed to the onset of the Pt. illness. The chaplain understands the Pt. strong personality and gift of communication supported her well in her career. Carrie [name] speaks of hope and God's love in the the family's space of watchful waiting. ? ?Carrie [name] accepted F/U spiritual care visits and intercessory prayer for the Pt. ? ?Chaplain Stephanie Acre ?[phone] ?

## 2021-09-02 NOTE — Progress Notes (Signed)
Date and time results received: 09/02/21 9:10 AM ? ?(use smartphrase ".now" to insert current time) ? ?Test: WBC ?Critical Value: 1.3 ? ?Name of Provider Notified: Dr Denese Killings and Dr Mcarthur Rossetti (CCM)  ? ?Orders Received? Or Actions Taken?: No further orders for now.  ? ?Sherral Hammers RN ?

## 2021-09-02 NOTE — Progress Notes (Signed)
Brief oncology note: ? ?CBC from this morning has been reviewed.  WBC this morning is 1.4.  ANC pending.  Given ongoing fevers and recent chemotherapy, will initiate Granix 300 mcg subcu daily x2 days. ? ?Hemoglobin has improved to 7.9 today after receiving 1 unit PRBCs on 09/01/2021.  Platelets stable to slightly improved today at 31,000. ? ?Clenton PareKristin Lawson [place], DNP, AGPCNP-BC, AOCNP ? ?

## 2021-09-02 NOTE — Progress Notes (Signed)
eLink Physician-Brief Progress Note ?Patient Name: Carrie [name] ?DOB: September 07, 1960 ?MRN: [id number] ? ? ?Date of Service ? 09/02/2021  ?HPI/Events of Note ? WBC 1.4  ?eICU Interventions ? Bedside RN instructed to notify Dr.  Truitt Merle, the Hematology / Oncology attending physician on the case.  ? ? ? ?  ? ?Kerry Kass Talor [place] ?09/02/2021, 5:58 AM ?

## 2021-09-02 NOTE — Progress Notes (Signed)
Date and time results received: 09/02/21 0530 ? ?Test: WBC ?Critical Value: 1.4 ? ?Name of Provider Notified: Elink  ? ?Orders Received? Or Actions Taken?: No new orders at this time Will pass on to day shift/ Hematology  ?

## 2021-09-02 NOTE — Progress Notes (Signed)
Mg 1.6  Replaced per protocol  

## 2021-09-02 NOTE — Progress Notes (Signed)
Spoke with Dr Denese Killings and Dr Mcarthur Rossetti about RN's concerns about taking patient down for a CT abdomen right now due to her hemodynamic instability and agitation. Verbal order from Dr. Denese Killings that it is not an urgent CT and can push til tomorrow if patient is unable to be stabilized. Will continue to monitor and attempt to take patient down for CT if safe. ? ?Sherral Hammers RN ?

## 2021-09-02 NOTE — Progress Notes (Addendum)
? ?NAME:  Carrie [name], MRN:  [id number], DOB:  1960/10/13, LOS: 15 ?ADMISSION DATE:  08/29/2021, CONSULTATION DATE:  3/17 ?REFERRING MD:  Darrick Meigs, CHIEF COMPLAINT:  Confusion  ? ?History of Present Illness:  ?Patient is a 61 year old female with PMHx as below iniitally admitted for AMS, abdominal pain, constipation, decreased PO intake with dehydration. There were reports that patient experienced similar symptoms when her prednisone was tapered down. She was noted to be pancytopenic for which oncology was consulted. Patient thought to have folic acid deficiency. ADAMTS13 nl. 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 Bone marrow biopsy obtained - hypoproductive without blast or malignant cells noted.  ?Initial CT Head and MRI Brain negative for acute intracranial abnormalities and patient's encephalopathy was thought to be secondary to steroid withdrawal. However, on 3/16, neurology consulted for progressively worsening encephalopathy. Patient recommended for LP and start empiric abx coverage for meningitis.Patient also started on acyclovir for possible viral encephalitis. Recommended for MR imaging of spine with elictable clonus in RUE and BLE.  ?DIC panel obtained on 3/17 with elevated D-dimer and low fibronogen with several small bruises on arms with worsening pancytopenia - patient was given 1u pRBC and cryo.  Experienced 3-min seizure activity after attempted central access placement - required intubation for airway protection.  ?LP performed on 3/18 - CSF non-infectious. Patient transferred to [facility] for LTM EEG monitoring  ? ?Pertinent  Medical History  ?Rheumatoid arthritis ?Dermatomyositis ?Fatty liver ?Hyperlipidemia ?Hypertension ?Rhabdomyolysis ? ?Significant Hospital Events: ?Including procedures, antibiotic start and stop dates in addition to other pertinent events   ?03/2021 labs: ANA positive, Jo-1 neg, centromere neg, ds DNA  neg, RNP positive, SSA/SSB neg, RF positive, chromatin Ab neg, CCP neg ?MRI 3/13-no acute findings ?3/17 bone marrow biopsy > hypercellular bone marrow with trilineage hematopoiesis, comment: non-specific, follow up cytogenetic studies ?3/12 CT abdomen and pelvis-no acute findings, adrenal nodule, diverticulosis ?3/12 CT head without contrast-no acute findings ?3/16 EEG-moderate diffuse encephalopathy of nonspecific etiology, no seizures or epileptiform discharges noted during the recording ?3/17 intubation ?3/18 LP performed: no leukocytes, protein elevated, transfer to [facility] for LTM EEG; PICC line placed ?3/20 remains on pulse dose steroids; intermittently tolerating pressure support ?3/21 started on IVIG ?3/24 seen by Heme/Onc, 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 ?3/26 Candida fungemia for which started on micafungin. Dex and Etoposide stopped. [place] discussion with family - palliative care consulted  ? ?Micro ?3/18 csf > neg to date ?3/18 csf fungus > neg  ?3/18 csf vzv > neg ?3/18 csf hsv > neg ?3/18 csf cryptococcal ag > neg ?3/18 csf vdrl > neg ?3/18 csf hsv > neg ?3/18  > lyme disease > negative  ?3/16 blood culture > neg to date ?3/12 blood > staph auricularis 1/4 bottles ?3/12 sars cov 2/flu > negative ?3/16 > parvoB19 IgG pos, IgM neg ?3/16 > EBV IgG pos, IgM neg ?3/16 > CMV IgG pos, IgM neg ?3/16 > HHV6 IgG pos ?3/17 > bone marrow biopsy > hypercellular with trilineage hematopoiesis ?3/18 blastomyces antigen > negative ?3/18 CSF fungal culture > negative ?3/23 blood culture 1/4 > c. Glabrata ?3/26 blood culture > pending ? ?Abx ?Rocephin 3/12 ?Acyclovir 3/16>3/19  ?Unasyn 3/19 > 3/24 ?Micafungin 3/24 >  ? ?Interim History / Subjective:  ?Patient remains sedated and mechanically ventilated. No pressor requirement at this time. Tmax 101.9 overnight.  ? ?Objective   ?Blood pressure  96/61, pulse (!) 122, temperature (!) 101.9 ?F (38.8 ?C), temperature  source Axillary, resp. rate (!) 33, height _0  (1.6 m), weight 70 kg, SpO2 93 %. ?   ?Vent Mode: PRVC ?FiO2 (%):  [50 %] 50 % ?Set Rate:  [16 bmp] 16 bmp ?Vt Set:  [420 mL] 420 mL ?PEEP:  [8 cmH20] 8 cmH20 ?Plateau Pressure:  [18 cmH20-22 cmH20] 22 cmH20  ? ?Intake/Output Summary (Last 24 hours) at 09/02/2021 0711 ?Last data filed at 09/02/2021 0500 ?Gross per 24 hour  ?Intake 2832.47 ml  ?Output 4300 ml  ?Net -1467.53 ml  ? ?Filed Weights  ? 08/28/21 0356 08/29/21 0500 08/30/21 0500  ?Weight: 70.4 kg 69.2 kg 70 kg  ? ? ?Examination: ?General:  Critically ill appearing middle aged female; appears uncomfortable; on vent support ?HENT: NCAT ETT in place; disconjugate gaze, PERRL ?PULM: bibasilar decreased breath sounds; on full vent support; dyssynchronous w/vent ?CV: tachycardic, no mgr, distal pulses intact  ?GI: BS diminished, soft, nontender ?MSK: normal bulk and tone, diffuse anasarca  ?Neuro: pupils reactive; upward gaze; feet in extended posture. +gag reflex ? ?Resolved Hospital Problem list   ?Aspiration pneumonia ?Circulatory shock likely 2/2 sedation - improved off sedation ? 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 Repeat blood cultures neg thus far.  ?- ID consulted, appreciate recommendations  ?- Continue Micafungin ?- CT Abd/Pelvis wo contrast when able  ?- Holding etoposide at this time  ?- Sedation for PAD protocol - on precedex and fentanyl gtt; started klonopin yesterday; however, still requiring lot of versed pushes. Looks very uncomfortable. Will adjust to versed gtt. Also increasing klonopin. Will need to monitor closely for serotonin syndrome  ?- F/u repeat blood cultures  ? ?Acute respiratory faiure with hypoxemia  ?Full mechanical vent  support ?VAP prevention ?Daily WUA/SBT ? ?Pancytopenia> candidemia vs HLH ?WBC 1.4 this AM; Hb responded appropriately to 1u pRBC (6.9>7.9). Plt stable at 31. No active bleeding. Neutropenia may be in setting of recent chemo.  ?Heme/onc consulted, appreciate recommendations  ?Monitor for bleeding ?Transfuse PRBC for Hgb < 7 gm/dL ? ?Hypervolemia ?Diuresed 4.3L over past 24hrs. Still net +1L positive.  ?- Lasix 57m bid  ?- Trend renal function ?- Strict I&O  ? ?Acute renal failure - improving  ?Hypokalemia - resolved ?Monitor BMET and UOP ?Replace electrolytes as needed ? ?Shock liver vs HLH effect - stable ?Trend LFTs ? ?Hyperglycemia > improved after adding tube feeding coverage ?Continue SSI ?Continue tube feeding coverage ? ?Possible seizure ?Continue keppra  ? ?Hx of dermatomyositis ?Rheumatoid arthritis ?Chronic steroid use - at risk for adrenal insufficiency ?CK wnl. Has been on high dose steroids and has received pulse dose steroids over the past 5 days. High risk for adrenal insufficiency ?- IV dexamethasone 121mdaily; taper  ? ?[place]?Family would like to continue with full scope of care at this time. Repeat family meeting Wed @ 11AM ?Palliative care consulted, appreciate assistance ? ?Best Practice (right click and "Reselect all SmartList Selections" daily)  ? ?Diet/type: tubefeeds ?DVT prophylaxis: SCD ?GI prophylaxis: PPI ?Lines: see above ?Foley:  N/A and removal ordered  ?Code Status:  full code ?Last date of multidisciplinary goals of care discussion [family updated at bedside] ? ?Labs   ?CBC: ?  Recent Labs  ?Lab 08/29/21 ?0414 08/30/21 ?0404 08/31/21 ?5913 09/01/21 ?6859 09/02/21 ?0439  ?WBC 6.7 6.0 5.4 5.9 1.4*  ?NEUTROABS  --   --   --  5.3  --   ?HGB 6.5* 8.7* 8.2* 6.9* 7.9*  ?HCT 19.1* 26.0* 25.2* 20.4* 23.8*  ?MCV 88.8 90.9 90.6 91.9 86.5  ?PLT 37* 38* 27* 27* 31*  ? ? ?Basic Metabolic Panel: ?Recent Labs  ?Lab 08/29/21 ?0414 08/30/21 ?9234 08/31/21 ?1443 09/01/21 ?6016 09/02/21 ?0439  ?NA 140  141 143 144 145  ?K 3.7 4.4 3.8 3.5 4.1  ?CL 109 109 112* 113* 111  ?CO2 _0 ?GLUCOSE 139* 262* 158* 152* 130*  ?BUN 58* 62* 71* 87* 90*  ?CREATININE 1.52* 1.50* 1.31* 1.30* 1.26*  ?CALCIUM 7.4* 7.8* 8.6*

## 2021-09-02 NOTE — Progress Notes (Signed)
?   ? ?[facility] ? ?Date of Admission:  08/28/2021    ?       ?Reason for visit: Follow up on candidemia ? ? ? ?Current antibiotics: ?Micafungin 3/24-- present ? ?ASSESSMENT:   ? ?61 y.o. female admitted with: ? ?Candida glabrata fungemia: Blood cultures positive on 3/23 in 1 out of 4 bottles with repeat blood cultures 3/26 no growth to date.  Her PICC line that had been in place was removed on 3/25. ?Neutropenia with fevers: Neutropenia suspected due to chemotherapy and planning for Granix x2 doses. 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  Dexamethasone has been resumed. ?Pancytopenia ?Elevated LFTs ?Rheumatoid arthritis/dermatomyositis ? ?RECOMMENDATIONS:   ? ?Continue micafungin ?Follow-up repeat blood cultures and pending sensitivities ?Continue line holiday as hemodynamics allow until repeat blood cultures finalized no growth ?Holding [place] therapy in the setting of candidemia for now.  If repeat blood cultures are no growth later this week anticipate can resume therapy per hematology ?Will start cefepime in the setting of neutropenia and fevers pending CT abdomen/pelvis ?Will follow ? ? ?Principal Problem: ?  Candidemia ([place]) ?Active Problems: ?  Hyperlipidemia ?  Hypertension ?  Generalized weakness ?  Rheumatoid arthritis flare (HCC) ?  Depression with anxiety ?  Lactic acidosis ?  Pancytopenia ([place]) ?  Volume depletion ?  Long-term corticosteroid use ?  Open wound of right hand ?  Adrenal nodule ([place]) ?  Pressure injury of skin ?  High anion gap metabolic acidosis ?  Altered mental status ?  Metabolic encephalopathy ?  Encephalitis ?  Acute encephalopathy ?  [place] (hemophagocytic lymphohistiocytosis) ([place]) ?  Goals of care, counseling/discussion ? ? ? ?MEDICATIONS:   ? ?Scheduled Meds: ?? ARIPiprazole  5 mg Per Tube QHS  ?? chlorhexidine gluconate (MEDLINE KIT)  15 mL Mouth Rinse BID  ?? Chlorhexidine Gluconate Cloth  6 each Topical Q0600  ?? clonazepam  2 mg  Per Tube BID  ?? dexamethasone (DECADRON) injection  1 mg Intravenous Q24H  ?? escitalopram  20 mg Per Tube Daily  ?? feeding supplement (PROSource TF)  45 mL Per Tube Daily  ?? folic acid  1 mg Per Tube Daily  ?? furosemide  40 mg Intravenous Q12H  ?? insulin aspart  0-15 Units Subcutaneous Q4H  ?? insulin aspart  8 Units Subcutaneous Q4H  ?? levETIRAcetam  500 mg Per Tube BID  ?? mouth rinse  15 mL Mouth Rinse 10 times per day  ?? metoprolol tartrate  12.5 mg Per Tube BID  ?? multivitamin with minerals  1 tablet Per Tube Daily  ?? mupirocin ointment   Topical BID  ?? nutrition supplement (JUVEN)  1 packet Per Tube BID BM  ?? pantoprazole sodium  40 mg Per Tube Daily  ?? senna-docusate  2 tablet Per Tube QHS  ?? sodium chloride flush  10-40 mL Intracatheter Q12H  ?? Tbo-filgastrim (GRANIX) SQ  300 mcg Subcutaneous Daily  ?? thiamine  100 mg Per Tube Daily  ? ?Continuous Infusions: ?? sodium chloride 250 mL (08/30/21 2121)  ?? sodium chloride    ?? ceFEPime (MAXIPIME) IV 2 g (09/02/21 1011)  ?? feeding supplement (VITAL AF 1.2 CAL) 70 mL/hr at 09/01/21 1800  ?? fentaNYL infusion INTRAVENOUS 200 mcg/hr (09/02/21 1011)  ?? micafungin [facility]) IV Stopped (09/01/21 2143)  ?? midazolam 5 mg/hr (09/02/21 1011)  ?? norepinephrine (LEVOPHED) Adult infusion 2 mcg/min (09/02/21 1011)  ? ?PRN Meds:.acetaminophen (TYLENOL) oral liquid 160 mg/5  mL **OR** acetaminophen **OR** acetaminophen, alteplase, fentaNYL, heparin lock flush, heparin lock flush, ipratropium-albuterol, lip balm, midazolam, sodium chloride flush, sodium chloride flush, sodium chloride flush ? ?SUBJECTIVE:  ? ?24 hour events:  ? ?Patient febrile overnight Tmax 101.9 ?WBC 1.4, ANC 900 ?Creatinine stable, LFTs stable, T. bili 1.1 ?Patient requiring norepinephrine in the setting of sedation this morning ?Not following commands and unresponsive and nursing reports agitation and tachycardia with stimulation. ? ? ? ?Review of Systems  ?Unable to perform ROS:  Intubated  ? ?  ?OBJECTIVE:  ? ?Blood pressure 100/71, pulse 100, temperature (!) 101.9 ?F (38.8 ?C), temperature source Axillary, resp. rate (!) 21, height _0  (1.6 m), weight 70 kg, SpO2 93 %. ?Body mass index is 27.34 kg/m?. ? ?Physical Exam ?Constitutional:   ?   Comments: Ill-appearing woman, lying in bed, intubated and sedated  ?HENT:  ?   Head: Normocephalic and atraumatic.  ?Neck:  ?   Comments: ET tube in place ?Pulmonary:  ?   Comments: Symmetric chest rise and fall on the ventilator ?Abdominal:  ?   General: There is no distension.  ?   Palpations: Abdomen is soft.  ?Musculoskeletal:     ?   General: Swelling present.  ?Skin: ?   General: Skin is warm and dry.  ?   Findings: No rash.  ?Neurological:  ?   Comments: Sedated   ?Psychiatric:  ?   Comments: Unable to assess  ? ? ? ?Lab Results: ?Lab Results  ?Component Value Date  ? WBC 1.4 (LL) 09/02/2021  ? HGB 7.9 (L) 09/02/2021  ? HCT 23.8 (L) 09/02/2021  ? MCV 86.5 09/02/2021  ? PLT 31 (L) 09/02/2021  ?  ?Lab Results  ?Component Value Date  ? NA 145 09/02/2021  ? K 4.1 09/02/2021  ? CO2 29 09/02/2021  ? GLUCOSE 130 (H) 09/02/2021  ? BUN 90 (H) 09/02/2021  ? CREATININE 1.26 (H) 09/02/2021  ? CALCIUM 8.8 (L) 09/02/2021  ? GFRNONAA 48 (L) 09/02/2021  ? GFRAA  08/24/2008  ?  >60        ?The eGFR has been calculated ?using the MDRD equation. ?This calculation has not been ?validated in all clinical ?situations. ?eGFR's persistently ?<60 mL/min signify ?possible Chronic Kidney Disease.  ?  ?Lab Results  ?Component Value Date  ? ALT 52 (H) 09/02/2021  ? AST 111 (H) 09/02/2021  ? ALKPHOS 415 (H) 09/02/2021  ? BILITOT 1.1 09/02/2021  ? ? ?   ?Component Value Date/Time  ? CRP 19.4 (H) 08/26/2021 1134  ? ? ?   ?Component Value Date/Time  ? ESRSEDRATE 6 08/26/2021 1134  ? ?  ?I have reviewed the micro and lab results in Epic. ? ?Imaging: ?DG CHEST PORT 1 VIEW ? ?Result Date: 09/02/2021 ?CLINICAL DATA:  Acute respiratory failure with hypoxia. EXAM: PORTABLE CHEST 1  VIEW COMPARISON:  08/30/2021 FINDINGS: The endotracheal tube is 2.5 cm above the carina. The feeding tube is coursing down the esophagus and into the stomach. Persistent bilateral pulmonary infiltrates no pleural effusions or pneumothorax. IMPRESSION: 1. Support apparatus in good position. 2. Persistent bilateral pulmonary infiltrates. Electronically Signed   By: Marijo Sanes M.D.   On: 09/02/2021 09:09  ? ?DG Abd Portable 1V ? ?Result Date: 09/01/2021 ?CLINICAL DATA:  Feeding tube placement EXAM: PORTABLE ABDOMEN - 1 VIEW COMPARISON:  08/24/2021 FINDINGS: Feeding tube terminates at the antropyloric region. No bowel obstruction or gross free intraperitoneal air. Low pelvis excluded. IMPRESSION: Feeding tube terminating at the  antrum/pyloric region. Electronically Signed   By: Abigail Miyamoto M.D.   On: 09/01/2021 11:04    ? ?Imaging independently reviewed in Epic.  ? ? ?Mignon Pine ?[facility] ?[place] ?[phone] pager ?09/02/2021, 10:27 AM ? ? ?

## 2021-09-03 ENCOUNTER — Inpatient Hospital Stay (HOSPITAL_COMMUNITY): Payer: Federal, State, Local not specified - PPO

## 2021-09-03 DIAGNOSIS — G0490 Encephalitis and encephalomyelitis, unspecified: Secondary | ICD-10-CM | POA: Diagnosis not present

## 2021-09-03 DIAGNOSIS — E278 Other specified disorders of adrenal gland: Secondary | ICD-10-CM | POA: Diagnosis not present

## 2021-09-03 DIAGNOSIS — G9340 Encephalopathy, unspecified: Secondary | ICD-10-CM | POA: Diagnosis not present

## 2021-09-03 DIAGNOSIS — D701 Agranulocytosis secondary to cancer chemotherapy: Secondary | ICD-10-CM | POA: Diagnosis not present

## 2021-09-03 DIAGNOSIS — D761 Hemophagocytic lymphohistiocytosis: Secondary | ICD-10-CM | POA: Diagnosis not present

## 2021-09-03 DIAGNOSIS — F418 Other specified anxiety disorders: Secondary | ICD-10-CM | POA: Diagnosis not present

## 2021-09-03 DIAGNOSIS — D61818 Other pancytopenia: Secondary | ICD-10-CM | POA: Diagnosis not present

## 2021-09-03 DIAGNOSIS — Z7189 Other specified counseling: Secondary | ICD-10-CM | POA: Diagnosis not present

## 2021-09-03 DIAGNOSIS — B377 Candidal sepsis: Secondary | ICD-10-CM | POA: Diagnosis not present

## 2021-09-03 DIAGNOSIS — E872 Acidosis, unspecified: Secondary | ICD-10-CM | POA: Diagnosis not present

## 2021-09-03 LAB — COMPREHENSIVE METABOLIC PANEL
ALT: 44 U/L (ref 0–44)
AST: 70 U/L — ABNORMAL HIGH (ref 15–41)
Albumin: 1.5 g/dL — ABNORMAL LOW (ref 3.5–5.0)
Alkaline Phosphatase: 308 U/L — ABNORMAL HIGH (ref 38–126)
Anion gap: 6 (ref 5–15)
BUN: 92 mg/dL — ABNORMAL HIGH (ref 8–23)
CO2: 31 mmol/L (ref 22–32)
Calcium: 8.8 mg/dL — ABNORMAL LOW (ref 8.9–10.3)
Chloride: 109 mmol/L (ref 98–111)
Creatinine, Ser: 1.12 mg/dL — ABNORMAL HIGH (ref 0.44–1.00)
GFR, Estimated: 56 mL/min — ABNORMAL LOW (ref >60)
Glucose, Bld: 199 mg/dL — ABNORMAL HIGH (ref 70–99)
Potassium: 3.8 mmol/L (ref 3.5–5.1)
Sodium: 146 mmol/L — ABNORMAL HIGH (ref 135–145)
Total Bilirubin: 0.9 mg/dL (ref 0.3–1.2)
Total Protein: 5 g/dL — ABNORMAL LOW (ref 6.5–8.1)

## 2021-09-03 LAB — GLUCOSE, CAPILLARY
Glucose-Capillary: 196 mg/dL — ABNORMAL HIGH (ref 70–99)
Glucose-Capillary: 168 mg/dL — ABNORMAL HIGH (ref 70–99)
Glucose-Capillary: 163 mg/dL — ABNORMAL HIGH (ref 70–99)
Glucose-Capillary: 165 mg/dL — ABNORMAL HIGH (ref 70–99)
Glucose-Capillary: 152 mg/dL — ABNORMAL HIGH (ref 70–99)
Glucose-Capillary: 171 mg/dL — ABNORMAL HIGH (ref 70–99)

## 2021-09-03 LAB — CBC
HCT: 22.3 % — ABNORMAL LOW (ref 36.0–46.0)
Hemoglobin: 7.1 g/dL — ABNORMAL LOW (ref 12.0–15.0)
MCH: 27.7 pg (ref 26.0–34.0)
MCHC: 31.8 g/dL (ref 30.0–36.0)
MCV: 87.1 fL (ref 80.0–100.0)
Platelets: 24 10*3/uL — CL (ref 150–400)
RBC: 2.56 MIL/uL — ABNORMAL LOW (ref 3.87–5.11)
RDW: 21.8 % — ABNORMAL HIGH (ref 11.5–15.5)
WBC: 0.9 10*3/uL — CL (ref 4.0–10.5)
nRBC: 0 % (ref 0.0–0.2)

## 2021-09-03 MED ORDER — NOREPINEPHRINE 4 MG/250ML-% IV SOLN: 0.0000 ug/min | INTRAVENOUS | Status: DC

## 2021-09-03 MED ORDER — INSULIN ASPART 100 UNIT/ML IJ SOLN
4.0000 [IU] | INTRAMUSCULAR | Status: DC
  Administered 2021-09-03 – 2021-09-11 (×48): 4 [IU] via SUBCUTANEOUS

## 2021-09-03 MED ORDER — ALBUMIN HUMAN 5 % IV SOLN
12.5000 g | Freq: Once | INTRAVENOUS | Status: AC
  Administered 2021-09-03: 12.5 g via INTRAVENOUS

## 2021-09-03 MED ORDER — NOREPINEPHRINE 4 MG/250ML-% IV SOLN
INTRAVENOUS | Status: AC
  Administered 2021-09-03: 1 ug/min via INTRAVENOUS
  Filled 2021-09-03: qty 250

## 2021-09-03 MED ORDER — INSULIN GLARGINE-YFGN 100 UNIT/ML ~~LOC~~ SOLN
15.0000 [IU] | Freq: Every day | SUBCUTANEOUS | Status: DC
  Administered 2021-09-03 – 2021-09-04 (×2): 15 [IU] via SUBCUTANEOUS
  Filled 2021-09-03 (×3): qty 0.15

## 2021-09-03 MED ORDER — NOREPINEPHRINE 4 MG/250ML-% IV SOLN: 2.0000 ug/min | INTRAVENOUS | Status: DC

## 2021-09-03 MED ORDER — PROSOURCE TF PO LIQD
90.0000 mL | Freq: Two times a day (BID) | ORAL | Status: DC
  Administered 2021-09-03 – 2021-09-21 (×37): 90 mL
  Filled 2021-09-03 (×35): qty 90

## 2021-09-03 MED ORDER — IOHEXOL 300 MG/ML  SOLN
100.0000 mL | Freq: Once | INTRAMUSCULAR | Status: AC | PRN
  Administered 2021-09-03: 100 mL via INTRAVENOUS

## 2021-09-03 MED ORDER — SODIUM CHLORIDE 0.9 % IV SOLN: 250.0000 mL | INTRAVENOUS | Status: DC

## 2021-09-03 MED ORDER — VITAL 1.5 CAL PO LIQD
1000.0000 mL | ORAL | Status: DC
  Administered 2021-09-03 – 2021-09-09 (×8): 1000 mL

## 2021-09-03 MED ORDER — ALBUMIN HUMAN 5 % IV SOLN
INTRAVENOUS | Status: AC
Start: 2021-09-03 — End: 2021-09-03
  Filled 2021-09-03: qty 250

## 2021-09-03 MED ORDER — POTASSIUM CHLORIDE 20 MEQ PO PACK
40.0000 meq | PACK | Freq: Once | ORAL | Status: AC
  Administered 2021-09-03: 40 meq
  Filled 2021-09-03: qty 2

## 2021-09-03 NOTE — Progress Notes (Signed)
? ?                                                                                                                                                     ?                                                   ?Daily Progress Note  ? ?Patient Name: Carrie [name]       Date: 09/03/2021 ?DOB: 21-Jul-1960  Age: 61 y.o. MRN#: [id number] ?Attending Physician: Kipp Brood, MD ?Primary Care Physician: Carollee Herter, Alferd Apa, DO ?Admit Date: 09/05/2021 ? ?Reason for Consultation/Follow-up: Establishing goals of care ? ?Subjective: ?Medical records reviewed. Patient assessed at the beside. Met with niece/HCPOA Bonnita Nasuti and relocated to conference room for family meeting. Patient's son Monica Becton was able to join the conversation via telephone. ? ?Created space and opportunity for family's thoughts and feelings on her current illness. We reviewed interval history since our initial discussion, including test results, vital signs, and overall care plan in detail. I shared that I hope she will improve further and some factors are promising. I also shared my worry that she will rapidly decompensate and suffer despite our best curative efforts. They do understand she is severely ill and at high risk to succumb to her illnesses. I offered to provide information about comfort care in the event that this happens. Family prefers to discuss this if necessary at a later date. ? ?I recommended consideration of DNR status, understanding evidenced-based poor outcomes in similar hospitalized patients, as the cause of the arrest is likely associated with terminal disease rather than a reversible acute cardio-pulmonary event. Bonnita Nasuti and [place] both agree they do not want patient to suffer. They would like to revisit the conversation based on her progress over the next few days. ? ?Questions and concerns addressed. PMT will continue to support holistically. ? ?Length of Stay: 16 ? ?Current Medications: ?Scheduled Meds:  ? ARIPiprazole  5 mg Per Tube QHS  ?  chlorhexidine gluconate (MEDLINE KIT)  15 mL Mouth Rinse BID  ? Chlorhexidine Gluconate Cloth  6 each Topical Q0600  ? clonazepam  2 mg Per Tube BID  ? dexamethasone (DECADRON) injection  1 mg Intravenous Q24H  ? escitalopram  20 mg Per Tube Daily  ? feeding supplement (PROSource TF)  45 mL Per Tube Daily  ? folic acid  1 mg Per Tube Daily  ? furosemide  40 mg Intravenous Q12H  ? insulin aspart  0-15 Units Subcutaneous Q4H  ? insulin aspart  8 Units Subcutaneous Q4H  ? levETIRAcetam  500 mg Per Tube BID  ? mouth rinse  15 mL Mouth Rinse 10 times  per day  ? metoprolol tartrate  12.5 mg Per Tube BID  ? multivitamin with minerals  1 tablet Per Tube Daily  ? mupirocin ointment   Topical BID  ? nutrition supplement (JUVEN)  1 packet Per Tube BID BM  ? pantoprazole sodium  40 mg Per Tube Daily  ? senna-docusate  2 tablet Per Tube QHS  ? sodium chloride flush  10-40 mL Intracatheter Q12H  ? Tbo-filgastrim (GRANIX) SQ  300 mcg Subcutaneous Daily  ? thiamine  100 mg Per Tube Daily  ? ? ?Continuous Infusions: ? sodium chloride 250 mL (08/30/21 2121)  ? sodium chloride    ? ceFEPime (MAXIPIME) IV Stopped (09/02/21 2340)  ? feeding supplement (VITAL AF 1.2 CAL) 1,000 mL (09/03/21 0408)  ? fentaNYL infusion INTRAVENOUS 100 mcg/hr (09/03/21 0800)  ? micafungin [facility]) IV Stopped (09/02/21 2227)  ? midazolam 3 mg/hr (09/03/21 0800)  ? norepinephrine (LEVOPHED) Adult infusion Stopped (09/02/21 1818)  ? ? ?PRN Meds: ?acetaminophen (TYLENOL) oral liquid 160 mg/5 mL **OR** acetaminophen **OR** acetaminophen, alteplase, fentaNYL, heparin lock flush, heparin lock flush, ipratropium-albuterol, lip balm, midazolam, sodium chloride flush, sodium chloride flush, sodium chloride flush ? ?Physical Exam ?Vitals and nursing note reviewed.  ?Constitutional:   ?   Appearance: She is ill-appearing.  ?   Interventions: She is sedated and intubated.  ?Cardiovascular:  ?   Rate and Rhythm: Normal rate.  ?Pulmonary:  ?   Effort: She is intubated.   ?Skin: ?   General: Skin is warm and dry.  ?Neurological:  ?   Comments: sedated  ?         ? ?Vital Signs: BP 112/84 (BP Location: Left Arm)   Pulse 96   Temp 98 ?F (36.7 ?C) (Axillary)   Resp 18   Ht _0  (1.6 m)   Wt 70 kg   SpO2 97%   BMI 27.34 kg/m?  ?SpO2: SpO2: 97 % ?O2 Device: O2 Device: Ventilator ?O2 Flow Rate: O2 Flow Rate (L/min): 4 L/min ? ?Intake/output summary:  ?Intake/Output Summary (Last 24 hours) at 09/03/2021 0841 ?Last data filed at 09/03/2021 0800 ?Gross per 24 hour  ?Intake 2704.29 ml  ?Output 3251 ml  ?Net -546.71 ml  ? ?LBM: Last BM Date : 09/03/21 ?Baseline Weight: Weight: 63.5 kg ?Most recent weight: Weight: 70 kg ? ?     ?Palliative Assessment/Data: 10% ? ? ? ? ? ?Patient Active Problem List  ? Diagnosis Date Noted  ? Candidemia ([place]) 09/01/2021  ? Goals of care, counseling/discussion   ? [place] (hemophagocytic lymphohistiocytosis) ([place]) 08/29/2021  ? Metabolic encephalopathy   ? Encephalitis   ? Acute encephalopathy   ? Altered mental status   ? High anion gap metabolic acidosis 59/16/3846  ? Lactic acidosis 08/15/2021  ? Disorder of adrenal gland, unspecified ([place]) 08/14/2021  ? Pancytopenia ([place]) 08/16/2021  ? Volume depletion 08/11/2021  ? Long-term corticosteroid use 08/26/2021  ? Open wound of right hand 08/27/2021  ? Adrenal nodule ([place]) 08/30/2021  ? Pressure injury of skin 08/19/2021  ? Dermatomyositis ([place]) 06/23/2021  ? Fatty liver 06/23/2021  ? Inflammatory arthritis 06/23/2021  ? Depression with anxiety 05/26/2021  ? Dyspepsia 05/26/2021  ? Hyponatremia   ? Depression   ? Confusion   ? Debility 04/08/2021  ? Rheumatoid arthritis flare ([place]) 04/08/2021  ? Fall (on) (from) other stairs and steps, subsequent encounter 04/08/2021  ? Non-traumatic rhabdomyolysis   ? Pre-syncope 03/26/2021  ? Generalized weakness 03/26/2021  ? Joint pain 03/26/2021  ? Bronchitis  01/17/2021  ? Depression, major, single episode, moderate ([place]) 08/25/2018  ? Depression, major, single episode,  severe ([place]) 04/26/2017  ? Bunion 04/22/2017  ? Pruritus 08/04/2016  ? Environmental and seasonal allergies 08/04/2016  ? BACK PAIN 04/28/2010  ? OTHER ACUTE REACTIONS TO STRESS 03/26/2010  ? HOT FLASHES 01/17/2010  ? FATIGUE 02/02/2008  ? Hyperlipidemia 08/18/2007  ? Hypertension 02/25/2007  ? Chest pain, unspecified 02/25/2007  ? REDUCTION MAMMOPLASTY, HX OF 02/25/2007  ? ? ?Palliative Care Assessment & Plan  ? ?Patient Profile: ?61 y.o. female  with past medical history of depression with anxiety, dermatomyositis, fatty liver disease, hyperlipidemia, hypertension, nontraumatic rhabdomyolysis, presyncope, history of rheumatoid arthritis on hydroxychloroquine, mycophenolate and daily prednisone admitted on 08/30/2021 with to altered mental status, abdominal pain, constipation for several days, decreased oral intake with dehydration. Patient has presented to ED 3 times in the past 2 weeks for similar presentation, admitted 3 times in the past 6 months. ?  ?Patient has poor prognosis with complicated illness - has developed fungemia which interferes with treatment of [place]. PMT has been consulted to assist with goals of care conversation. ? ? ?Assessment: ?Candida fungemia ?[place] ?Seizure ?Acute respiratory failure with hypoxemia ?Acute metabolic encephalopathy ?[facility] ?Goals of care conversation ? ? ?Recommendations/Plan: ?Continue full code/full scope treatment ?Family wishes to continue aggressive care for next 72 hours and allow time for outcomes ?Follow up meeting scheduled for Saturday 4/1 at 12pm for ongoing goc discussions ?PMT will continue to support holistically ? ? ?Prognosis: ? Guarded ? ?Discharge Planning: ?To Be Determined ? ?Care plan was discussed with RN, patient's niece and son (HCPOAs) ? ? ? ?MDM: High ? ? ?Dorthy Cooler, PA-C ?Palliative Medicine Team ?Team phone # [phone] ? ?Thank you for allowing the Palliative Medicine Team to assist in the care of this patient. Please utilize secure chat  with additional questions, if there is no response within 30 minutes please call the above phone number. ? ?Palliative Medicine Team providers are available by phone from 7am to 7pm daily and can be reached through th

## 2021-09-03 NOTE — Progress Notes (Signed)
? ?NAME:  Carrie [name], MRN:  [id number], DOB:  1961/05/25, LOS: 25 ?ADMISSION DATE:  08/12/2021, CONSULTATION DATE:  3/17 ?REFERRING MD:  Darrick Meigs, CHIEF COMPLAINT:  Confusion  ? ?History of Present Illness:  ?Patient is a 61 year old female with PMHx as below iniitally admitted for AMS, abdominal pain, constipation, decreased PO intake with dehydration. There were reports that patient experienced similar symptoms when her prednisone was tapered down. She was noted to be pancytopenic for which oncology was consulted. Patient thought to have folic acid deficiency. ADAMTS13 nl. 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 Bone marrow biopsy obtained - hypoproductive without blast or malignant cells noted.  ?Initial CT Head and MRI Brain negative for acute intracranial abnormalities and patient's encephalopathy was thought to be secondary to steroid withdrawal. However, on 3/16, neurology consulted for progressively worsening encephalopathy. Patient recommended for LP and start empiric abx coverage for meningitis.Patient also started on acyclovir for possible viral encephalitis. Recommended for MR imaging of spine with elictable clonus in RUE and BLE.  ?DIC panel obtained on 3/17 with elevated D-dimer and low fibronogen with several small bruises on arms with worsening pancytopenia - patient was given 1u pRBC and cryo.  Experienced 3-min seizure activity after attempted central access placement - required intubation for airway protection.  ?LP performed on 3/18 - CSF non-infectious. Patient transferred to [facility] for LTM EEG monitoring  ? ?Pertinent  Medical History  ?Rheumatoid arthritis ?Dermatomyositis ?Fatty liver ?Hyperlipidemia ?Hypertension ?Rhabdomyolysis ? ?Significant Hospital Events: ?Including procedures, antibiotic start and stop dates in addition to other pertinent events   ?03/2021 labs: ANA positive, Jo-1 neg, centromere neg, ds DNA  neg, RNP positive, SSA/SSB neg, RF positive, chromatin Ab neg, CCP neg ?MRI 3/13-no acute findings ?3/17 bone marrow biopsy > hypercellular bone marrow with trilineage hematopoiesis, comment: non-specific, follow up cytogenetic studies ?3/12 CT abdomen and pelvis-no acute findings, adrenal nodule, diverticulosis ?3/12 CT head without contrast-no acute findings ?3/16 EEG-moderate diffuse encephalopathy of nonspecific etiology, no seizures or epileptiform discharges noted during the recording ?3/17 intubation ?3/18 LP performed: no leukocytes, protein elevated, transfer to [facility] for LTM EEG; PICC line placed ?3/20 remains on pulse dose steroids; intermittently tolerating pressure support ?3/21 started on IVIG ?3/24 seen by Heme/Onc, 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 ?3/26 Candida fungemia for which started on micafungin. Dex and Etoposide stopped. [place] discussion with family - palliative care consulted  ?3/28 Febrile overnight for which cefepime added. WBC dropped to 1.4; started on Granix inj. ? ?Micro ?3/18 csf > neg to date ?3/18 csf fungus > neg  ?3/18 csf vzv > neg ?3/18 csf hsv > neg ?3/18 csf cryptococcal ag > neg ?3/18 csf vdrl > neg ?3/18 csf hsv > neg ?3/18  > lyme disease > negative  ?3/16 blood culture > neg to date ?3/12 blood > staph auricularis 1/4 bottles ?3/12 sars cov 2/flu > negative ?3/16 > parvoB19 IgG pos, IgM neg ?3/16 > EBV IgG pos, IgM neg ?3/16 > CMV IgG pos, IgM neg ?3/16 > HHV6 IgG pos ?3/17 > bone marrow biopsy > hypercellular with trilineage hematopoiesis ?3/18 blastomyces antigen > negative ?3/18 CSF fungal culture > negative ?3/23 blood culture 1/4 > c. Glabrata ?3/26 blood culture > pending ? ?Abx ?Rocephin 3/12 ?Acyclovir 3/16>3/19  ?Unasyn 3/19 > 3/24 ?Micafungin 3/24 >  ?Cefepime 3/28 >  ? ?Interim History / Subjective:  ?Patient remains sedated  and mechanically ventilated. Afebrile for 24 hours. WBC worse this morning. Plt 27.  Does have gingival bleeding.  ? ?Objective   ?Blood pressure 91/67, pulse 92, temperature 98.7 ?F (37.1 ?C), temperature source Axillary, resp. rate 16, height $RemoveBe'5\' 3"'bWbyTOkhv$  (1.6 m), weight 70 kg, SpO2 96 %. ?   ?Vent Mode: PRVC ?FiO2 (%):  [40 %-50 %] 40 % ?Set Rate:  [16 bmp] 16 bmp ?Vt Set:  [420 mL] 420 mL ?PEEP:  [8 cmH20] 8 cmH20 ?Plateau Pressure:  [18 cmH20-27 cmH20] 18 cmH20  ? ?Intake/Output Summary (Last 24 hours) at 09/03/2021 0714 ?Last data filed at 09/03/2021 0700 ?Gross per 24 hour  ?Intake 2827.95 ml  ?Output 3601 ml  ?Net -773.05 ml  ? ?Filed Weights  ? 08/28/21 0356 08/29/21 0500 08/30/21 0500  ?Weight: 70.4 kg 69.2 kg 70 kg  ? ? ?Examination: ?General:  Critically ill appearing middle aged female; appears uncomfortable; on vent support ?HENT: NCAT ETT in place; disconjugate gaze, PERRL ?PULM: difuse rhonchi ; on full vent support; synchronous w vent ?CV: tachycardic, no mgr, distal pulses intact  ?GI: BS diminished, soft, nontender ?MSK: normal bulk and tone, diffuse anasarca improved in BLE ?Neuro: pupils reactive; upward gaze; feet in extended posture. +gag reflex ? ?Resolved Hospital Problem list   ?Aspiration pneumonia ?Circulatory shock likely 2/2 sedation - improved off sedation ? 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 Repeat blood cultures neg thus far.  ?- ID consulted, appreciate recommendations  ?- Continue Micafungin; started cefepime on 3/28 for neutropenic fever ?- F/u CT Abd/Pelvis w contrast  ?- Holding etoposide at this time  ?- Sedation for PAD protocol - on versed and fentanyl gtt. Klonopin increased 3/29. Will attempt to wean sedation as able  ?- F/u repeat blood cultures and sensitivities ? ?Acute respiratory faiure with hypoxemia   ?Full mechanical vent support ?VAP prevention ?Daily WUA/SBT ? ?Pancytopenia> candidemia vs HLH ?WBC 1.4> 0.9 this AM; Hb trending down. Plt down to 24. Does have some gingival bleeding this morning with oral care.  This is in setting of recent chemo. Did receive 1 dose of Granix yesterday.  ?Heme/onc consulted, appreciate recommendations  ?Monitor for bleeding ?Transfuse PRBC for Hgb < 7 gm/dL or platelet <20 or with active bleeding ? ?Hypervolemia ?Diuresed 3.6L over past 24hrs. Still net +1L positive. Renal function stable. ?- Lasix $Remove'40mg'XMhwIeE$  bid  ?- Trend renal function ?- Strict I&O  ? ?Acute renal failure - improving  ?Hypokalemia - resolved ?Monitor BMET and UOP ?Replace electrolytes as needed ? ?Shock liver vs HLH effect - improving ?Trend LFTs ? ?Hyperglycemia > improved after adding tube feeding coverage ?Continue SSI ?Continue tube feeding coverage ? ?Possible seizure ?Continue keppra  ? ?Hx of dermatomyositis ?Rheumatoid arthritis ?Chronic steroid use - at risk for adrenal insufficiency ?CK wnl. Has been on high dose steroids and has received pulse dose steroids over the past 5 days. High risk for adrenal insufficiency ?- IV dexamethasone $RemoveBeforeDE'1mg'femAzrHJfIWXSLW$  daily; taper  ? ?[place]  ?Family would like to continue with full scope of care at this time. Repeat family meeting Wed @ 11AM ?Palliative care consulted, appreciate assistance ? ?Best Practice (right click and "Reselect all SmartList Selections" daily)  ? ?Diet/type: tubefeeds ?DVT prophylaxis: SCD ?GI prophylaxis: PPI ?  Lines: see above ?Foley:  N/A and removal ordered  ?Code Status:  full code ?Last date of multidisciplinary goals of care discussion [family updated at bedside] ? ?Labs   ?CBC: ?Recent Labs  ?Lab 08/31/21 ?6553 09/01/21 ?0348 09/02/21 ?7482 09/02/21 ?7078 09/03/21 ?6754  ?WBC 5.4 5.9 1.3* 1.4* 0.9*  ?NEUTROABS  --  5.3 0.9*  --   --   ?HGB 8.2* 6.9* 7.8* 7.9* 7.1*  ?HCT 25.2* 20.4* 24.4* 23.8* 22.3*  ?MCV 90.6 91.9 87.8 86.5 87.1  ?PLT 27* 27* 30* 31* 24*   ? ? ?Basic Metabolic Panel: ?Recent Labs  ?Lab 08/30/21 ?4920 08/31/21 ?1007 09/01/21 ?1219 09/02/21 ?7588 09/03/21 ?3254  ?NA 141 143 144 145 146*  ?K 4.4 3.8 3.5 4.1 3.8  ?CL 109 112* 113* 111 109  ?CO2

## 2021-09-03 NOTE — Progress Notes (Signed)
Date and time results received: 09/03/21 6:41 AM  ? ?Test: WBC ?Critical Value: 0.9 ? ?Platelets: 24 ? ?Name of Provider Notified:  ? ?Orders Received? Or Actions Taken?: Will pass on to day shift  nurse to notify oncology  ?

## 2021-09-03 NOTE — Progress Notes (Signed)
Nutrition Follow-up ? ?DOCUMENTATION CODES:  ? ?Not applicable ? ?INTERVENTION:  ? ?Tube feeding via Cortrak tube (gastric): ?Vital 1.5 at 55 ml/h (1320 ml per day) ?Prosource TF 90 ml BID ? ?Provides 2140 kcal, 133 gm protein, 1003 ml free water daily ? ?1 packet Juven BID, each packet provides 95 calories, 2.5 grams of protein (collagen), and 9.8 grams of carbohydrate (3 grams sugar); also contains 7 grams of L-arginine and L-glutamine, 300 mg vitamin C, 15 mg vitamin E, 1.2 mcg vitamin B-12, 9.5 mg zinc, 200 mg calcium, and 1.5 g  Calcium Beta-hydroxy-Beta-methylbutyrate to support wound healing ? ?Continue MVI with minerals daily per tube ? ?NUTRITION DIAGNOSIS:  ? ?Increased nutrient needs related to acute illness, wound healing as evidenced by estimated needs. ?Ongoing.  ? ?GOAL:  ? ?Patient will meet greater than or equal to 90% of their needs ?Met with TF.  ? ?MONITOR:  ? ?Vent status, Labs, Weight trends, I & O's, Skin ? ?REASON FOR ASSESSMENT:  ? ?Consult ?Enteral/tube feeding initiation and management ? ?ASSESSMENT:  ? ?61 y.o. female past medical history of HTN, HLD, depression, anxiety, dermatomyositis, rheumatoid arthritis on Plaquenil, mycophenolate, and daily prednisone, and fatty liver. She presented to the ED due to AMS, abdominal pain, and several days of constipation with significantly decreased PO intake. Her niece reported that prednisone was decreased and then subsequently needed to be increased recently. Her niece reported that patient has had similar symptoms in the past when steroids have been tapered. She was admitted for lactic acidosis. ? ?Pt discussed during ICU rounds and with RN. 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 Worsening pancytopenia attributed to [facility] but patient found to be fungemic with C.glabrata.  ?Palliative care following.  ?Noted stage II and III wounds ? ? ?3/17 intubated  ?3/27 s/p cortrak placement; tip  gastric per xray  ? ?Patient is currently intubated on ventilator support ?MV: 8.7 L/min ?Temp (24hrs), Avg:98.5 ?F (36.9 ?C), Min:98 ?F (36.7 ?C), Max:99 ?F (37.2 ?C) ? ?Medications reviewed and include: decadron, folic acid, lasix, SSI, novolog, semglee, keppra MVI with minerals, protonix, thiamine  ? ?Labs reviewed: Na 146, magnesium: 1.6 ?Ammonia: 23 ?TG: 229 ?CRP: 19.4 (3/21) ?B1: 47.4 (3/12) ?B12: 2220 (3/14) ?Ferritin: >7500 ?CBG's: 163-197  ? ?UOP: 3500 ml  ? ?Current TF:  ?Vital 1.2 @ 70 ml/hr ?45 ml ProSource TF daily ?Provides: 2056 kcal and 137 grams protein ? ?Diet Order:   ?Diet Order   ? ?       ?  Diet NPO time specified  Diet effective now       ?  ? ?  ?  ? ?  ? ? ?EDUCATION NEEDS:  ? ?No education needs have been identified at this time ? ?Skin:  Skin Assessment: Skin Integrity Issues: ?Skin Integrity Issues:: Stage II ?Stage II: buttocks ?Stage III: R hand and sacrum ? ?Last BM:  100 ml ? ?Height:  ? ?Ht Readings from Last 1 Encounters:  ?08/26/21 $RemoveBe'5\' 3"'nAyIjBEin$  (1.6 m)  ? ? ?Weight:  ? ?Wt Readings from Last 1 Encounters:  ?08/30/21 70 kg  ? ? ?Ideal Body Weight:  47.7 kg ? ?BMI:  Body mass index is 27.34 kg/m?. ? ?Estimated Nutritional Needs:  ? ?Kcal:  2000-2300 ? ?Protein:  125-140 grams ? ?Fluid:  >/= 2 L/day ? ?Lockie Pares., RD, LDN, CNSC ?See AMiON for contact information  ? ?

## 2021-09-03 NOTE — Progress Notes (Addendum)
?   09/03/21 1600  ?Vitals  ?BP (!) 82/63  ?MAP (mmHg) 71  ?Pulse Rate 77  ?ECG Heart Rate 71  ?Resp 15  ?Oxygen Therapy  ?SpO2 100 %  ? ?Contacted CCM who will add levo gtt, albumin, and VO to hold evening lasix. ?

## 2021-09-03 NOTE — Progress Notes (Signed)
Consult received for placement for USGPIV. However, this patient currently has appropriate PIV at this time. Also secure chat sent to [place]. RN who confirmed no need for further PIV placement at this time. Fran Lowes, RN VAST ?

## 2021-09-03 NOTE — Progress Notes (Signed)
Pt was transported to Ct scan and back without complication.  ?

## 2021-09-03 NOTE — Progress Notes (Signed)
?   ? ?[facility] for Infectious Disease ? ?Date of Admission:  08/30/2021    ?       ?Reason for visit: Follow up on candidemia ? ?Current antibiotics: ?Micafungin 3/24-- present ?Cefepime 3/28-- present ? ? ? ?ASSESSMENT:   ? ?61 y.o. female admitted with: ? ?Candida glabrata fungemia: Blood cx positive from 3/23 in 1 of 4 bottles.  Susceptibilities pending.  Repeat cultures 3/26 NGTD.  PICC line removed 3/25. ?Neutropenia:  Started on cefepime 3/28 due to neutropenia and fevers.  Afebrile last 24 hrs and CT abdomen pending. 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  Per heme/onc. ?Hypoxic respiratory failure ?Pancytopenia ?Encephalopathy: Unclear etiology.  ? [place] vs autoimmune (Autoimmune CSF and Serum panel negative).  ?AKI ?Elevated LFTs ?RA/Dermatomyositis ? ?RECOMMENDATIONS:   ? ?Continue micafungin ?Continue cefepime for now ?Follow up repeat blood cultures ?If blood cultures from 3/26 remain negative on Thursday or Friday, would be okay to resume [place] treatment if deemed appropriate by hematology ?Follow up CT abd/pelvis and family meeting ?Will follow ? ? ?Principal Problem: ?  Candidemia ([place]) ?Active Problems: ?  Hyperlipidemia ?  Hypertension ?  Generalized weakness ?  Rheumatoid arthritis flare (HCC) ?  Depression with anxiety ?  Lactic acidosis ?  Pancytopenia ([place]) ?  Volume depletion ?  Long-term corticosteroid use ?  Open wound of right hand ?  Adrenal nodule ([place]) ?  Pressure injury of skin ?  High anion gap metabolic acidosis ?  Altered mental status ?  Metabolic encephalopathy ?  Encephalitis ?  Acute encephalopathy ?  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  Goals of care, counseling/discussion ? ? ? ?MEDICATIONS:   ? ?Scheduled Meds: ? ARIPiprazole  5 mg Per Tube QHS  ? chlorhexidine gluconate (MEDLINE KIT)  15 mL Mouth Rinse BID  ? Chlorhexidine Gluconate Cloth  6 each Topical Q0600  ? clonazepam  2 mg Per Tube BID  ? dexamethasone (DECADRON) injection  1 mg Intravenous  Q24H  ? escitalopram  20 mg Per Tube Daily  ? feeding supplement (PROSource TF)  45 mL Per Tube Daily  ? folic acid  1 mg Per Tube Daily  ? furosemide  40 mg Intravenous Q12H  ? insulin aspart  0-15 Units Subcutaneous Q4H  ? insulin aspart  8 Units Subcutaneous Q4H  ? levETIRAcetam  500 mg Per Tube BID  ? mouth rinse  15 mL Mouth Rinse 10 times per day  ? metoprolol tartrate  12.5 mg Per Tube BID  ? multivitamin with minerals  1 tablet Per Tube Daily  ? mupirocin ointment   Topical BID  ? nutrition supplement (JUVEN)  1 packet Per Tube BID BM  ? pantoprazole sodium  40 mg Per Tube Daily  ? senna-docusate  2 tablet Per Tube QHS  ? sodium chloride flush  10-40 mL Intracatheter Q12H  ? Tbo-filgastrim (GRANIX) SQ  300 mcg Subcutaneous Daily  ? thiamine  100 mg Per Tube Daily  ? ?Continuous Infusions: ? sodium chloride 250 mL (08/30/21 2121)  ? sodium chloride    ? ceFEPime (MAXIPIME) IV Stopped (09/02/21 2340)  ? feeding supplement (VITAL AF 1.2 CAL) 1,000 mL (09/03/21 0408)  ? fentaNYL infusion INTRAVENOUS 100 mcg/hr (09/03/21 0800)  ? micafungin [facility]) IV Stopped (09/02/21 2227)  ? midazolam 3 mg/hr (09/03/21 0800)  ? norepinephrine (LEVOPHED) Adult infusion Stopped (09/02/21 1818)  ? ?PRN Meds:.acetaminophen (TYLENOL) oral liquid 160 mg/5 mL **OR** acetaminophen **OR** acetaminophen, alteplase, fentaNYL, heparin lock flush, heparin  lock flush, ipratropium-albuterol, lip balm, midazolam, sodium chloride flush, sodium chloride flush, sodium chloride flush ? ?SUBJECTIVE:  ? ?24 hour events:  ?Tmax 99.7 ?Improved creatinine, LFTs, Alk phos ?Persistent pancytopenia ?WBC 0.9  ?No new images this AM ?CXR yesterday with bilateral infiltrates ?No new micro ?3/26 blood cx NGTD ?Remains intubated, sedated ?Not on pressors ?FiO2 40, PEEP 8 ?RN reports CT to be done this AM.  Family meeting planned later today. ? ? ? ?Review of Systems  ?Unable to perform ROS: Intubated  ? ?  ?OBJECTIVE:  ? ?Blood pressure 112/84, pulse 96,  temperature 98 ?F (36.7 ?C), temperature source Axillary, resp. rate 18, height $RemoveBe'5\' 3"'qPzmjoAjO$  (1.6 m), weight 70 kg, SpO2 97 %. ?Body mass index is 27.34 kg/m?. ? ?Physical Exam ?Constitutional:   ?   Comments: Ill appearing, intubated, sedated.  ?HENT:  ?   Head: Normocephalic and atraumatic.  ?Eyes:  ?   Comments: Eyes closed.   ?Neck:  ?   Comments: ET tube in place.  ?Cardiovascular:  ?   Rate and Rhythm: Normal rate and regular rhythm.  ?Pulmonary:  ?   Comments: Ventilated with symmetric chest rise and fall.  Guppy breathing.  ?Abdominal:  ?   General: There is no distension.  ?   Palpations: Abdomen is soft.  ?   Tenderness: There is no abdominal tenderness.  ?Musculoskeletal:     ?   General: Swelling present.  ?Skin: ?   General: Skin is warm and dry.  ?   Findings: No rash.  ?Neurological:  ?   Comments: Not following commands. Sedated and intubated.   ? ? ? ?Lab Results: ?Lab Results  ?Component Value Date  ? WBC 0.9 (LL) 09/03/2021  ? HGB 7.1 (L) 09/03/2021  ? HCT 22.3 (L) 09/03/2021  ? MCV 87.1 09/03/2021  ? PLT 24 (LL) 09/03/2021  ?  ?Lab Results  ?Component Value Date  ? NA 146 (H) 09/03/2021  ? K 3.8 09/03/2021  ? CO2 31 09/03/2021  ? GLUCOSE 199 (H) 09/03/2021  ? BUN 92 (H) 09/03/2021  ? CREATININE 1.12 (H) 09/03/2021  ? CALCIUM 8.8 (L) 09/03/2021  ? GFRNONAA 56 (L) 09/03/2021  ? GFRAA  08/24/2008  ?  >60        ?The eGFR has been calculated ?using the MDRD equation. ?This calculation has not been ?validated in all clinical ?situations. ?eGFR's persistently ?<60 mL/min signify ?possible Chronic Kidney Disease.  ?  ?Lab Results  ?Component Value Date  ? ALT 44 09/03/2021  ? AST 70 (H) 09/03/2021  ? ALKPHOS 308 (H) 09/03/2021  ? BILITOT 0.9 09/03/2021  ? ? ?   ?Component Value Date/Time  ? CRP 19.4 (H) 08/26/2021 1134  ? ? ?   ?Component Value Date/Time  ? ESRSEDRATE 6 08/26/2021 1134  ? ?  ?I have reviewed the micro and lab results in Epic. ? ?Imaging: ?DG CHEST PORT 1 VIEW ? ?Result Date:  09/02/2021 ?CLINICAL DATA:  Acute respiratory failure with hypoxia. EXAM: PORTABLE CHEST 1 VIEW COMPARISON:  08/30/2021 FINDINGS: The endotracheal tube is 2.5 cm above the carina. The feeding tube is coursing down the esophagus and into the stomach. Persistent bilateral pulmonary infiltrates no pleural effusions or pneumothorax. IMPRESSION: 1. Support apparatus in good position. 2. Persistent bilateral pulmonary infiltrates. Electronically Signed   By: Marijo Sanes M.D.   On: 09/02/2021 09:09  ? ?DG Abd Portable 1V ? ?Result Date: 09/01/2021 ?CLINICAL DATA:  Feeding tube placement EXAM: PORTABLE ABDOMEN - 1  VIEW COMPARISON:  08/24/2021 FINDINGS: Feeding tube terminates at the antropyloric region. No bowel obstruction or gross free intraperitoneal air. Low pelvis excluded. IMPRESSION: Feeding tube terminating at the antrum/pyloric region. Electronically Signed   By: Abigail Miyamoto M.D.   On: 09/01/2021 11:04    ? ?Imaging independently reviewed in Epic.  ? ? ?Mignon Pine ?[facility] for Infectious Disease ?[place] ?[phone] pager ?09/03/2021, 9:14 AM ? ?I have personally spent 50 minutes involved in face-to-face and non-face-to-face activities for this patient on the day of the visit. Professional time spent includes the following activities: Preparing to see the patient (review of tests), Obtaining and/or reviewing separately obtained history (admission/discharge record), Performing a medically appropriate examination and/or evaluation , Ordering medications/tests/procedures, referring and communicating with other health care professionals, Documenting clinical information in the EMR, Independently interpreting results (not separately reported), Communicating results to the patient/family/caregiver, Counseling and educating the patient/family/caregiver and Care coordination (not separately reported).  ? ? ?

## 2021-09-03 NOTE — Progress Notes (Signed)
This chaplain is present for F/U spiritual care. Family is not at the bedside. The chaplain understands a family meeting is planned today at 11am. The chaplain prayed beside the Pt.  This chaplain is available for F/U spiritual care as needed. ? ?Chaplain Stephanie Acre ?[phone] ?

## 2021-09-03 NOTE — Progress Notes (Signed)
Inpatient Diabetes Program Recommendations ? ?AACE/ADA: New Consensus Statement on Inpatient Glycemic Control (2015) ? ?Target Ranges:  Prepandial:   less than 140 mg/dL ?     Peak postprandial:   less than 180 mg/dL (1-2 hours) ?     Critically ill patients:  140 - 180 mg/dL  ? ?Lab Results  ?Component Value Date  ? GLUCAP 163 (H) 09/03/2021  ? HGBA1C 6.2 (H) 09-07-2021  ? ? ?Review of Glycemic Control ? Latest Reference Range & Units 09/02/21 11:45 09/02/21 15:56 09/02/21 19:18 09/02/21 23:11 09/03/21 03:17 09/03/21 07:48 09/03/21 11:33  ?Glucose-Capillary 70 - 99 mg/dL 583 (H) 094 (H) 076 (H) 165 (H) 196 (H) 168 (H) 163 (H)  ?(H): Data is abnormally high ? ? ?Current orders for Inpatient glycemic control: Semglee 15 units QD (just added), Novolog 0-15 units Q4H, Novolog 4 units Q4H TFC, Decadron 1 mg QD, Vital @ 70 mg/dL ? ?Inpatient Diabetes Program Recommendations:   ? ?CBG's mostly within hospital goal.  Question if patient needs Semglee 15 units added? ? ?Will continue to follow while inpatient. ? ?Thank you, ?Dulce Sellar, MSN, RN ?Diabetes Coordinator ?Inpatient Diabetes Program ?[phone] (team pager from 8a-5p) ? ? ? ? ?

## 2021-09-04 DIAGNOSIS — B377 Candidal sepsis: Secondary | ICD-10-CM | POA: Diagnosis not present

## 2021-09-04 DIAGNOSIS — G9341 Metabolic encephalopathy: Secondary | ICD-10-CM | POA: Diagnosis not present

## 2021-09-04 DIAGNOSIS — D701 Agranulocytosis secondary to cancer chemotherapy: Secondary | ICD-10-CM | POA: Diagnosis not present

## 2021-09-04 DIAGNOSIS — Z7189 Other specified counseling: Secondary | ICD-10-CM | POA: Diagnosis not present

## 2021-09-04 DIAGNOSIS — D61818 Other pancytopenia: Secondary | ICD-10-CM | POA: Diagnosis not present

## 2021-09-04 DIAGNOSIS — D761 Hemophagocytic lymphohistiocytosis: Secondary | ICD-10-CM | POA: Diagnosis not present

## 2021-09-04 LAB — ANTIFUNGAL AST 9 DRUG PANEL
Amphotericin B MIC: 1
Fluconazole Islt MIC: 16
Flucytosine MIC: 0.06
Itraconazole MIC: 1
Posaconazole MIC: 2
Voriconazole MIC: 0.5

## 2021-09-04 LAB — MISC LABCORP TEST (SEND OUT): Labcorp test code: 142455

## 2021-09-04 LAB — COMPREHENSIVE METABOLIC PANEL
ALT: 51 U/L — ABNORMAL HIGH (ref 0–44)
AST: 78 U/L — ABNORMAL HIGH (ref 15–41)
Albumin: 1.7 g/dL — ABNORMAL LOW (ref 3.5–5.0)
Alkaline Phosphatase: 300 U/L — ABNORMAL HIGH (ref 38–126)
Anion gap: 8 (ref 5–15)
BUN: 91 mg/dL — ABNORMAL HIGH (ref 8–23)
CO2: 28 mmol/L (ref 22–32)
Calcium: 9.3 mg/dL (ref 8.9–10.3)
Chloride: 109 mmol/L (ref 98–111)
Creatinine, Ser: 0.98 mg/dL (ref 0.44–1.00)
GFR, Estimated: >60 mL/min (ref >60)
Glucose, Bld: 131 mg/dL — ABNORMAL HIGH (ref 70–99)
Potassium: 4.5 mmol/L (ref 3.5–5.1)
Sodium: 145 mmol/L (ref 135–145)
Total Bilirubin: 0.9 mg/dL (ref 0.3–1.2)
Total Protein: 5.4 g/dL — ABNORMAL LOW (ref 6.5–8.1)

## 2021-09-04 LAB — GLUCOSE, CAPILLARY
Glucose-Capillary: 198 mg/dL — ABNORMAL HIGH (ref 70–99)
Glucose-Capillary: 139 mg/dL — ABNORMAL HIGH (ref 70–99)
Glucose-Capillary: 125 mg/dL — ABNORMAL HIGH (ref 70–99)
Glucose-Capillary: 238 mg/dL — ABNORMAL HIGH (ref 70–99)
Glucose-Capillary: 290 mg/dL — ABNORMAL HIGH (ref 70–99)
Glucose-Capillary: 266 mg/dL — ABNORMAL HIGH (ref 70–99)

## 2021-09-04 LAB — CBC
HCT: 22.5 % — ABNORMAL LOW (ref 36.0–46.0)
Hemoglobin: 7.3 g/dL — ABNORMAL LOW (ref 12.0–15.0)
MCH: 28.4 pg (ref 26.0–34.0)
MCHC: 32.4 g/dL (ref 30.0–36.0)
MCV: 87.5 fL (ref 80.0–100.0)
Platelets: 19 10*3/uL — CL (ref 150–400)
RBC: 2.57 MIL/uL — ABNORMAL LOW (ref 3.87–5.11)
RDW: 21.3 % — ABNORMAL HIGH (ref 11.5–15.5)
WBC: 0.9 10*3/uL — CL (ref 4.0–10.5)
nRBC: 0 % (ref 0.0–0.2)

## 2021-09-04 MED ORDER — SODIUM CHLORIDE 0.9% IV SOLUTION: Freq: Once | INTRAVENOUS | Status: AC

## 2021-09-04 MED ORDER — TBO-FILGRASTIM 480 MCG/0.8ML ~~LOC~~ SOSY
480.0000 ug | PREFILLED_SYRINGE | Freq: Every day | SUBCUTANEOUS | Status: DC
  Administered 2021-09-04 – 2021-09-05 (×2): 480 ug via SUBCUTANEOUS
  Filled 2021-09-04 (×2): qty 0.8

## 2021-09-04 NOTE — Progress Notes (Signed)
? ?NAME:  Carrie [name], MRN:  [id number], DOB:  [birth year], LOS: 75 ?ADMISSION DATE:  08/12/2021, CONSULTATION DATE:  3/17 ?REFERRING MD:  Darrick Meigs, CHIEF COMPLAINT:  Confusion  ? ?History of Present Illness:  ?Patient is a 61 year old female with PMHx as below iniitally admitted for AMS, abdominal pain, constipation, decreased PO intake with dehydration. There were reports that patient experienced similar symptoms when her prednisone was tapered down. She was noted to be pancytopenic for which oncology was consulted. Patient thought to have folic acid deficiency. ADAMTS13 nl. 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 Bone marrow biopsy obtained - hypoproductive without blast or malignant cells noted.  ?Initial CT Head and MRI Brain negative for acute intracranial abnormalities and patient's encephalopathy was thought to be secondary to steroid withdrawal. However, on 3/16, neurology consulted for progressively worsening encephalopathy. Patient recommended for LP and start empiric abx coverage for meningitis.Patient also started on acyclovir for possible viral encephalitis. Recommended for MR imaging of spine with elictable clonus in RUE and BLE.  ?DIC panel obtained on 3/17 with elevated D-dimer and low fibronogen with several small bruises on arms with worsening pancytopenia - patient was given 1u pRBC and cryo.  Experienced 3-min seizure activity after attempted central access placement - required intubation for airway protection.  ?LP performed on 3/18 - CSF non-infectious. Patient transferred to [facility] for LTM EEG monitoring  ? ?Pertinent  Medical History  ?Rheumatoid arthritis ?Dermatomyositis ?Fatty liver ?Hyperlipidemia ?Hypertension ?Rhabdomyolysis ? ?Significant Hospital Events: ?Including procedures, antibiotic start and stop dates in addition to other pertinent events   ?03/2021 labs: ANA positive, Jo-1 neg, centromere neg, ds DNA  neg, RNP positive, SSA/SSB neg, RF positive, chromatin Ab neg, CCP neg ?MRI 3/13-no acute findings ?3/17 bone marrow biopsy > hypercellular bone marrow with trilineage hematopoiesis, comment: non-specific, follow up cytogenetic studies ?3/12 CT abdomen and pelvis-no acute findings, adrenal nodule, diverticulosis ?3/12 CT head without contrast-no acute findings ?3/16 EEG-moderate diffuse encephalopathy of nonspecific etiology, no seizures or epileptiform discharges noted during the recording ?3/17 intubation ?3/18 LP performed: no leukocytes, protein elevated, transfer to [facility] for LTM EEG; PICC line placed ?3/20 remains on pulse dose steroids; intermittently tolerating pressure support ?3/21 started on IVIG ?3/24 seen by Heme/Onc, 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 ?3/26 Candida fungemia for which started on micafungin. Dex and Etoposide stopped. [place] discussion with family - palliative care consulted  ?3/28 Febrile overnight for which cefepime added. WBC dropped to 1.4; started on Granix inj. ? ?Micro ?3/18 csf > neg to date ?3/18 csf fungus > neg  ?3/18 csf vzv > neg ?3/18 csf hsv > neg ?3/18 csf cryptococcal ag > neg ?3/18 csf vdrl > neg ?3/18 csf hsv > neg ?3/18  > lyme disease > negative  ?3/16 blood culture > neg to date ?3/12 blood > staph auricularis 1/4 bottles ?3/12 sars cov 2/flu > negative ?3/16 > parvoB19 IgG pos, IgM neg ?3/16 > EBV IgG pos, IgM neg ?3/16 > CMV IgG pos, IgM neg ?3/16 > HHV6 IgG pos ?3/17 > bone marrow biopsy > hypercellular with trilineage hematopoiesis ?3/18 blastomyces antigen > negative ?3/18 CSF fungal culture > negative ?3/23 blood culture 1/4 > c. Glabrata ?3/26 blood culture > pending ? ?Abx ?Rocephin 3/12 ?Acyclovir 3/16>3/19  ?Unasyn 3/19 > 3/24 ?Micafungin 3/24 >  ?Cefepime 3/28 >  ? ?Interim History / Subjective:  ?Patient remains sedated  and mechanically ventilated. Able to come down on sedation some. Remains  neutropenic.  ? ?Objective   ?Blood pressure 109/81, pulse (!) 105, temperature 100 ?F (37.8 ?C), resp. rate (!) 23, height $RemoveBe'5\' 3"'XaEfgVbMH$  (1.6 m), weight 70 kg, SpO2 99 %. ?   ?Vent Mode: PRVC ?FiO2 (%):  [40 %] 40 % ?Set Rate:  [16 bmp] 16 bmp ?Vt Set:  [420 mL] 420 mL ?PEEP:  [5 cmH20-8 cmH20] 5 cmH20 ?Plateau Pressure:  [16 cmH20-18 cmH20] 18 cmH20  ? ?Intake/Output Summary (Last 24 hours) at 09/04/2021 1309 ?Last data filed at 09/04/2021 1203 ?Gross per 24 hour  ?Intake 2396.18 ml  ?Output 2000 ml  ?Net 396.18 ml  ? ? ?Filed Weights  ? 08/28/21 0356 08/29/21 0500 08/30/21 0500  ?Weight: 70.4 kg 69.2 kg 70 kg  ? ? ?Examination: ?General:  Critically ill appearing middle aged female; appears uncomfortable; on vent support ?HENT: NCAT ETT in place; disconjugate gaze, PERRL ?PULM: difuse rhonchi ; on full vent support; synchronous w vent. Tolerated brief SBT.  ?CV: tachycardic, no mgr, distal pulses intact  ?GI: BS diminished, soft, nontender ?MSK: normal bulk and tone, diffuse anasarca improved in BLE ?Neuro: pupils reactive; upward gaze; feet in extended posture. +gag reflex ? ?Resolved Hospital Problem list   ?Aspiration pneumonia ?Circulatory shock likely 2/2 sedation - improved off sedation ? 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 Repeat blood cultures neg thus far.  ?- ID consulted, appreciate recommendations  ?- Continue Micafungin; started cefepime on 3/28 for neutropenic fever ?- Holding etoposide at this time  ?- Sedation for PAD protocol - on versed and fentanyl gtt. Klonopin increased 3/29. Will attempt to wean sedation as able  ?- F/u repeat blood cultures and sensitivities ? ?Acute respiratory faiure with hypoxemia  ?Full mechanical vent support ?VAP  prevention ?Daily WUA/SBT ? ?Pancytopenia> candidemia vs HLH ?WBC 1.4> 0.9 this AM; Hb trending down. Plt down to 24. Does have some gingival bleeding this morning with oral care.  This is in setting of recent chemo. Did receive 1 dose of Granix yesterday.  ?Heme/onc consulted, appreciate recommendations  ?Monitor for bleeding ?Transfuse PRBC for Hgb < 7 gm/dL or platelet <20 or with active bleeding ? ?Hypervolemia ?Diuresed 3.6L over past 24hrs. Still net +1L positive. Renal function stable. ?- Lasix $Remove'40mg'QpjdbVi$  bid  ?- Trend renal function ?- Strict I&O  ? ?Acute renal failure - improving  ?Hypokalemia - resolved ?Monitor BMET and UOP ?Replace electrolytes as needed ? ?Shock liver vs HLH effect - improving ?Trend LFTs ? ?Hyperglycemia > improved after adding tube feeding coverage ?Continue SSI ?Continue tube feeding coverage ? ?Possible seizure ?Continue keppra  ? ?Hx of dermatomyositis ?Rheumatoid arthritis ?Chronic steroid use - at risk for adrenal insufficiency ?CK wnl. Has been on high dose steroids and has received pulse dose steroids over the past 5 days. High risk for adrenal insufficiency ?- IV dexamethasone $RemoveBeforeDE'1mg'xZuCfNBzVoicSIt$  daily; taper  ? ?[place]  ?Family would like to continue with full scope of care at this time. Repeat family meeting Wed @ 11AM ?Palliative care consulted, appreciate assistance ? ?Best Practice (right click and "Reselect all SmartList Selections" daily)  ? ?Diet/type: tubefeeds ?DVT prophylaxis: SCD ?GI prophylaxis: PPI ?Lines: see above ?Foley:  N/A and removal  ordered  ?Code Status:  full code ?Last date of multidisciplinary goals of care discussion [family updated at bedside] ? ?Labs   ?CBC: ?Recent Labs  ?Lab 09/01/21 ?0348 09/02/21 ?9718 09/02/21 ?2099 09/03/21 ?0689 09/04/21 ?0945  ?WBC 5.9 1.3* 1.4* 0.9* 0.9*  ?NEUTROABS 5.3 0.9*  --   --   --   ?HGB 6.9* 7.8* 7.9* 7.1* 7.3*  ?HCT 20.4* 24.4* 23.8* 22.3* 22.5*  ?MCV 91.9 87.8 86.5 87.1 87.5  ?PLT 27* 30* 31* 24* 19*  ? ? ? ?Basic Metabolic  Panel: ?Recent Labs  ?Lab 08/31/21 ?3406 09/01/21 ?0348 09/02/21 ?0439 09/03/21 ?8403 09/04/21 ?0945  ?NA 143 144 145 146* 145  ?K 3.8 3.5 4.1 3.8 4.5  ?CL 112* 113* 111 109 109  ?CO2 $Rem'27 24 29 31 28  'CzfA$ ?GLUCOSE 158* 152* 130* 199* 131*

## 2021-09-04 NOTE — Progress Notes (Signed)
Chaplain Maggie made initial visit. Pt's niece was present for entire visit. Family relies on Christian faith in this moment. The family's local pastor visited this morning for prayer. Pt's niece wore a shirt with the words, "Faith over Fear" and acknowledged with confidence that her aunt "will work it out". Chaplain normalized feelings and encouraged family to know the value of their presence at bedside. She spoke of the patient's son (her cousin) who is unable to be present here. Open communication with him about his mom's health situation contributes to the family accepting what is happening. Grand children call on the phone to offer words of love. The family bond is evident and strong. Chaplain offered encouragement and prayer for increasing awareness of the nearness of God. Family awaits update from medical team.  ?

## 2021-09-04 NOTE — Progress Notes (Deleted)
2300: patient restless trying to get out of bed despite being reoriented by nursing staff. Concerned that patient will not sit still in MRI if not sedated. E link notified new order for versed and soft waist belt.  ?  ?0400: patient increasingly agitated and not responding to verbal commands. Pt pulling and kicking at nursing staff despite receiving versed.Notified elink and new order for soft wrist/ankle restraints and  precedex. MRI on hold until pt able to tolerate.  ?

## 2021-09-04 NOTE — Progress Notes (Addendum)
HEMATOLOGY-ONCOLOGY PROGRESS NOTE ? ?ASSESSMENT AND PLAN: ?61 yo female  ?  ?Acute encephalopathy, ? 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  ?Recommendations: ?-Lab results reviewed.  WBC is 0.9, hemoglobin 7.3, and platelets 19,000.  Has received 2 doses of Granix.  We will continue Granix daily until [place] is 1.5 or higher.  Transfuse 1 unit of platelets today. ?-Recommend daily CBC with differential. ?-Transfuse PRBCs per ICU protocol and transfuse platelets for platelet count less than 20,000 or active bleeding. ?-Was started on dexamethasone.  Status post 1 dose of etoposide given on 08/29/2021.  Etoposide and dexamethasone on hold due to development of Candida glabrata fungemia.  Repeat blood cultures are negative to date. ?-Discussed with the patient's niece that we will await results of CD25 results before deciding on additional dexamethasone and chemotherapy.  We may start her back on dexamethasone and see how she tolerates before considering any chemotherapy. ?-We again reviewed with the patient's niece that she is critically ill and the possibility of death is very high even with treatment for [place].  Palliative care is following and plans to have another family meeting on Saturday. ? ?Mikey Bussing, DNP, AGPCNP-BC, AOCNP ? ?SUBJECTIVE: Remains on the ventilator.  Nursing reports that they are attempting to wean sedation.  No bleeding. ? ? ?REVIEW OF SYSTEMS:   ?Review of Systems  ?Reason unable to perform ROS: Sedated, on ventilator.  ? ?PHYSICAL EXAMINATION: ?ECOG PERFORMANCE STATUS: 4 - Bedbound ? ?Vitals:  ? 09/04/21 1218 09/04/21 1230   ?BP: 100/74 109/81  ?Pulse: (!) 103 (!) 105  ?Resp: 20 (!) 23  ?Temp: 100 ?F (37.8 ?C)   ?SpO2:  99%  ? ?Filed Weights  ? 08/28/21 0356 08/29/21 0500 08/30/21 0500  ?Weight: 70.4 kg 69.2 kg 70 kg  ? ? ?Intake/Output from previous day: ?03/29 0701 - 03/30 0700 ?In: 2257.1 [I.V.:522.1; NG/GT:1430; IV [id number] ?Out: 2425 [Urine:2225; Stool:200] ? ?Physical Exam ?Vitals reviewed.  ?Constitutional:   ?   Comments: Sedated, on ventilator  ?HENT:  ?   Head: Normocephalic.  ?Musculoskeletal:  ?   Right lower leg: Edema present.  ?   Left lower leg: Edema present.  ?Skin: ?   General: Skin is warm and dry.  ?   Comments: Ecchymoses to right neck  ?Neurological:  ?   Comments: Sedated.  ? ? ?LABORATORY DATA:  ?I have reviewed the data as listed ? ?  Latest Ref Rng & Units 09/04/2021  ?  9:45 AM 09/03/2021  ?  6:07 AM 09/02/2021  ?  4:39 AM  ?CMP  ?Glucose 70 - 99 mg/dL 131   199   130    ?BUN 8 - 23 mg/dL 91   92   90    ?Creatinine 0.44 - 1.00 mg/dL 0.98   1.12   1.26    ?Sodium 135 - 145 mmol/L 145   146   145    ?Potassium 3.5 - 5.1 mmol/L 4.5   3.8   4.1    ?Chloride 98 - 111 mmol/L 109   109   111    ?CO2 22 - 32 mmol/L 28   31   29     ?Calcium 8.9 - 10.3 mg/dL 9.3   8.8  8.8    ?Total Protein 6.5 - 8.1 g/dL 5.4   5.0   4.7    ?Total Bilirubin 0.3 - 1.2 mg/dL 0.9   0.9   1.1    ?Alkaline Phos 38 - 126 U/L 300   308   415    ?AST 15 - 41 U/L 78   70   111    ?ALT 0 - 44 U/L 51   44   52    ? ? ?Lab Results  ?Component Value Date  ? WBC 0.9 (LL) 09/04/2021  ? HGB 7.3 (L) 09/04/2021  ? HCT 22.5 (L) 09/04/2021  ? MCV 87.5 09/04/2021  ? PLT 19 (LL) 09/04/2021  ? NEUTROABS 0.9 (L) 09/02/2021  ? ? ?Lab Results  ?Component Value Date  ? CEA1 36.4 (H) 08/28/2021  ? ? ?DG Chest 1 View ? ?Result Date: 08/06/2021 ?CLINICAL DATA:  Dizziness, nausea and vomiting, bilateral leg pain EXAM: CHEST  1 VIEW COMPARISON:  04/16/2021 FINDINGS: Single frontal view of the chest demonstrates an unremarkable cardiac silhouette. No acute  airspace disease, effusion, or pneumothorax. No acute bony abnormalities. IMPRESSION: 1. No acute intrathoracic process. Electronically Signed   By: Randa Ngo M.D.   On: 08/06/2021 19:41  ? ?CT HEAD WO CONTRAST (5MM) ? ?Result Date: 08/23/2021 ?CLINICAL DATA:  61 year old female with history of delirium. Lower abdominal pain and constipation. EXAM: CT HEAD WITHOUT CONTRAST TECHNIQUE: Contiguous axial images were obtained from the base of the skull through the vertex without intravenous contrast. RADIATION DOSE REDUCTION: This exam was performed according to the departmental dose-optimization program which includes automated exposure control, adjustment of the mA and/or kV according to patient size and/or use of iterative reconstruction technique. COMPARISON:  Head CT 08/06/2021. FINDINGS: Brain: Mild cerebral atrophy. Patchy and confluent areas of decreased attenuation are noted throughout the deep and periventricular white matter of the cerebral hemispheres bilaterally, compatible with chronic microvascular ischemic disease. No evidence of acute infarction, hemorrhage, hydrocephalus, extra-axial collection or mass lesion/mass effect. Vascular: No hyperdense vessel or unexpected calcification. Skull: Normal. Negative for fracture or focal lesion. Sinuses/Orbits: No acute finding. Other: None. IMPRESSION: 1. No acute intracranial abnormalities. 2. Mild cerebral atrophy with chronic microvascular ischemic changes in the cerebral white matter, as above. Electronically Signed   By: Vinnie Langton M.D.   On: 08/29/2021 06:31  ? ?CT Head Wo Contrast ? ?Result Date: 08/06/2021 ?CLINICAL DATA:  Dizziness, nausea and vomiting EXAM: CT HEAD WITHOUT CONTRAST TECHNIQUE: Contiguous axial images were obtained from the base of the skull through the vertex without intravenous contrast. RADIATION DOSE REDUCTION: This exam was performed according to the departmental dose-optimization program which includes automated exposure  control, adjustment of the mA and/or kV according to patient size and/or use of iterative reconstruction technique. COMPARISON:  03/26/2021, 04/17/2021 FINDINGS: Brain: No acute infarct or hemorrhage. Lateral ventricles and midline structures are stable. No acute extra-axial fluid collections. No mass effect. Vascular: No hyperdense vessel or unexpected calcification. Skull: Normal. Negative for fracture or focal lesion. Sinuses/Orbits: No acute finding. Other: None. IMPRESSION: 1. No acute intracranial process. Electronically Signed   By: Randa Ngo M.D.   On: 08/06/2021 19:40  ? ?CT ABDOMEN W CONTRAST ? ?Result Date: 09/03/2021 ?CLINICAL DATA:  Candidiasis.  Evaluate for intra-abdominal abscess. EXAM: CT ABDOMEN WITH CONTRAST TECHNIQUE: Multidetector CT imaging of the abdomen was performed using the standard protocol following bolus administration of intravenous contrast. RADIATION DOSE REDUCTION: This exam was performed according to the departmental dose-optimization program  which includes automated exposure control, adjustment of the mA and/or kV according to patient size and/or use of iterative reconstruction technique. CONTRAST:  177mL OMNIPAQUE IOHEXOL 300 MG/ML  SOLN COMPARISON:  08/16/2021 FINDINGS: Lower chest: Significant interval progression of bilateral interstitial and airspace opacities within both lungs. Hepatobiliary: No focal liver abnormality. Small stone within the gallbladder measures 3 mm. No signs of gallbladder wall thickening or bile duct dilatation. Pancreas: Unremarkable. No pancreatic ductal dilatation or surrounding inflammatory changes. Spleen: Normal in size without focal abnormality. Adrenals/Urinary Tract: Normal right adrenal gland. 2 cm left adrenal nodule is identified measuring 22.6 Hounsfield units, image 37/3. Unchanged from previous exam. No nephrolithiasis, hydronephrosis, or mass. Stomach/Bowel: Enteric tube tip is in the body of the stomach. Enteric contrast material is  noted within the gastric lumen. The visualized bowel loops are nondilated. No wall thickening or bowel inflammation identified. Vascular/Lymphatic: No significant vascular findings are present. No enlarged abdominal

## 2021-09-04 NOTE — Progress Notes (Signed)
?   ? ?[facility] for Infectious Disease ? ?Date of Admission:  08/10/2021    ?       ?Reason for visit: Follow up on candidemia ? ?Current antibiotics: ?Micafungin 3/24-- present ?Cefepime 3/28-- present ? ?ASSESSMENT:   ? ?61 y.o. female admitted with: ? ?Candida glabrata fungemia: Blood cultures positive from 3/23 in 1 out of 4 bottles.  Presumably from PICC line that was initially placed 3/18 and removed 3/25.  Prior blood cultures were negative from 3/12 and 3/16.  Repeat blood cultures 3/26 are no growth to date. 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  IL 2 levels returned elevated lending further support to the diagnosis. ?Pancytopenia ?Bilateral airspace opacities ?Encephalopathy ?RA/dermatomyositis ? ?RECOMMENDATIONS:   ? ?Continue antifungal therapy with micafungin ?Will continue cefepime for now ?Difficult and complicated situation.  Diagnosis of [place] appears consistent based on her lab work including elevated ferritin, elevated IL-2, elevated triglycerides, elevated LFTs, and cytopenias.  She also had weeks to months of neurologic symptoms which can sometimes develop prior to other signs and symptoms with [place].  Complicating the situation is whether her fungemia is the cause of her [place] as treatment would focus on the underlying infection.  This is on the differential and is currently being treated with micafungin.  However, peripheral blood cultures were negative earlier this admission x 4 and low-grade fungemia was likely secondary to PICC line now removed.  Therefore, feel this is less likely the impetus for her immune activation given her presentation preceding this.  Additionally, while infections can be associated with [place] this is more often seen with viral infections and appears less common with fungi.  Would favor consideration of [place] targeted treatment at this time vs consideration of referral to tertiary center but will defer to the expertise of CCM and heme/onc ?Will  continue to follow ? ? ?Principal Problem: ?  Candidemia ([place]) ?Active Problems: ?  Hyperlipidemia ?  Hypertension ?  Generalized weakness ?  Rheumatoid arthritis flare (HCC) ?  Depression with anxiety ?  Lactic acidosis ?  Pancytopenia ([place]) ?  Volume depletion ?  Long-term corticosteroid use ?  Open wound of right hand ?  Adrenal nodule ([place]) ?  Pressure injury of skin ?  High anion gap metabolic acidosis ?  Altered mental status ?  Metabolic encephalopathy ?  Encephalitis ?  Acute encephalopathy ?  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  Goals of care, counseling/discussion ? ? ? ?MEDICATIONS:   ? ?Scheduled Meds: ? ARIPiprazole  5 mg Per Tube QHS  ? chlorhexidine gluconate (MEDLINE KIT)  15 mL Mouth Rinse BID  ? Chlorhexidine Gluconate Cloth  6 each Topical Q0600  ? clonazepam  2 mg Per Tube BID  ? dexamethasone (DECADRON) injection  1 mg Intravenous Q24H  ? escitalopram  20 mg Per Tube Daily  ? feeding supplement (PROSource TF)  90 mL Per Tube BID  ? folic acid  1 mg Per Tube Daily  ? insulin aspart  0-15 Units Subcutaneous Q4H  ? insulin aspart  4 Units Subcutaneous Q4H  ? insulin glargine-yfgn  15 Units Subcutaneous Daily  ? levETIRAcetam  500 mg Per Tube BID  ? mouth rinse  15 mL Mouth Rinse 10 times per day  ? metoprolol tartrate  12.5 mg Per Tube BID  ? multivitamin with minerals  1 tablet Per Tube Daily  ? mupirocin ointment   Topical BID  ? nutrition supplement (JUVEN)  1 packet Per Tube  BID BM  ? pantoprazole sodium  40 mg Per Tube Daily  ? sodium chloride flush  10-40 mL Intracatheter Q12H  ? thiamine  100 mg Per Tube Daily  ? ?Continuous Infusions: ? sodium chloride 250 mL (08/30/21 2121)  ? sodium chloride    ? sodium chloride    ? ceFEPime (MAXIPIME) IV Stopped (09/04/21 0930)  ? feeding supplement (VITAL 1.5 CAL) 1,000 mL (09/03/21 2140)  ? fentaNYL infusion INTRAVENOUS 75 mcg/hr (09/04/21 1100)  ? micafungin [facility]) IV Stopped (09/03/21 2259)  ? midazolam 2 mg/hr (09/04/21 1100)  ?  norepinephrine (LEVOPHED) Adult infusion Stopped (09/03/21 2028)  ? ?PRN Meds:.acetaminophen (TYLENOL) oral liquid 160 mg/5 mL **OR** acetaminophen **OR** acetaminophen, alteplase, fentaNYL, heparin lock flush, heparin lock flush, ipratropium-albuterol, lip balm, midazolam, sodium chloride flush, sodium chloride flush, sodium chloride flush ? ?SUBJECTIVE:  ? ?24 hour events:  ?Afebrile, Tmax 98.8 ?WBC 0.9, platelets 19, hemoglobin 7.3 ?C-Met pending ?3/26 blood cultures remain no growth ?CT abdomen with contrast yesterday with no evidence of intra-abdominal abscess.  There were findings of multifocal pulmonary inflammation or infection.  There was no evidence of hepatobiliary disease. ?IL2 level returned greater than 3000 ? ?Remains intubated and sedated ? ?Review of Systems  ?Unable to perform ROS: Intubated  ? ?  ?OBJECTIVE:  ? ?Blood pressure 109/76, pulse 83, temperature 98.8 ?F (37.1 ?C), temperature source Axillary, resp. rate (!) 32, height _0  (1.6 m), weight 70 kg, SpO2 98 %. ?Body mass index is 27.34 kg/m?. ? ?Physical Exam ?Constitutional:   ?   Comments: Intubated and sedated in the ICU.  Her niece is present at the bedside.  She is ill-appearing.  ?HENT:  ?   Head: Normocephalic and atraumatic.  ?   Comments: ET tube in place. ?Cardiovascular:  ?   Rate and Rhythm: Normal rate and regular rhythm.  ?Pulmonary:  ?   Comments: Ventilated breath sounds, diminished at the bases.  Symmetric chest rise and fall. ?Abdominal:  ?   General: There is no distension.  ?   Palpations: Abdomen is soft.  ?   Tenderness: There is no abdominal tenderness.  ?Musculoskeletal:     ?   General: Swelling present.  ?   Cervical back: Normal range of motion and neck supple.  ?   Comments: She has anasarca  ?Skin: ?   General: Skin is warm and dry.  ?Neurological:  ?   Comments: Sedated.  Not currently requiring pressors.  ? ? ? ?Lab Results: ?Lab Results  ?Component Value Date  ? WBC 0.9 (LL) 09/04/2021  ? HGB 7.3 (L)  09/04/2021  ? HCT 22.5 (L) 09/04/2021  ? MCV 87.5 09/04/2021  ? PLT 19 (LL) 09/04/2021  ?  ?Lab Results  ?Component Value Date  ? NA 146 (H) 09/03/2021  ? K 3.8 09/03/2021  ? CO2 31 09/03/2021  ? GLUCOSE 199 (H) 09/03/2021  ? BUN 92 (H) 09/03/2021  ? CREATININE 1.12 (H) 09/03/2021  ? CALCIUM 8.8 (L) 09/03/2021  ? GFRNONAA 56 (L) 09/03/2021  ? GFRAA  08/24/2008  ?  >60        ?The eGFR has been calculated ?using the MDRD equation. ?This calculation has not been ?validated in all clinical ?situations. ?eGFR's persistently ?<60 mL/min signify ?possible Chronic Kidney Disease.  ?  ?Lab Results  ?Component Value Date  ? ALT 44 09/03/2021  ? AST 70 (H) 09/03/2021  ? ALKPHOS 308 (H) 09/03/2021  ? BILITOT 0.9 09/03/2021  ? ? ?   ?  Component Value Date/Time  ? CRP 19.4 (H) 08/26/2021 1134  ? ? ?   ?Component Value Date/Time  ? ESRSEDRATE 6 08/26/2021 1134  ? ?  ?I have reviewed the micro and lab results in Epic. ? ?Imaging: ?CT ABDOMEN W CONTRAST ? ?Result Date: 09/03/2021 ?CLINICAL DATA:  Candidiasis.  Evaluate for intra-abdominal abscess. EXAM: CT ABDOMEN WITH CONTRAST TECHNIQUE: Multidetector CT imaging of the abdomen was performed using the standard protocol following bolus administration of intravenous contrast. RADIATION DOSE REDUCTION: This exam was performed according to the departmental dose-optimization program which includes automated exposure control, adjustment of the mA and/or kV according to patient size and/or use of iterative reconstruction technique. CONTRAST:  19m OMNIPAQUE IOHEXOL 300 MG/ML  SOLN COMPARISON:  08/14/2021 FINDINGS: Lower chest: Significant interval progression of bilateral interstitial and airspace opacities within both lungs. Hepatobiliary: No focal liver abnormality. Small stone within the gallbladder measures 3 mm. No signs of gallbladder wall thickening or bile duct dilatation. Pancreas: Unremarkable. No pancreatic ductal dilatation or surrounding inflammatory changes. Spleen: Normal in  size without focal abnormality. Adrenals/Urinary Tract: Normal right adrenal gland. 2 cm left adrenal nodule is identified measuring 22.6 Hounsfield units, image 37/3. Unchanged from previous exam. No nephrolit

## 2021-09-05 ENCOUNTER — Telehealth (HOSPITAL_BASED_OUTPATIENT_CLINIC_OR_DEPARTMENT_OTHER): Payer: Self-pay

## 2021-09-05 ENCOUNTER — Inpatient Hospital Stay (HOSPITAL_COMMUNITY): Payer: Federal, State, Local not specified - PPO

## 2021-09-05 DIAGNOSIS — D761 Hemophagocytic lymphohistiocytosis: Secondary | ICD-10-CM | POA: Diagnosis not present

## 2021-09-05 DIAGNOSIS — D701 Agranulocytosis secondary to cancer chemotherapy: Secondary | ICD-10-CM | POA: Diagnosis not present

## 2021-09-05 DIAGNOSIS — D61818 Other pancytopenia: Secondary | ICD-10-CM | POA: Diagnosis not present

## 2021-09-05 DIAGNOSIS — B377 Candidal sepsis: Secondary | ICD-10-CM | POA: Diagnosis not present

## 2021-09-05 LAB — CBC WITH DIFFERENTIAL/PLATELET
Abs Immature Granulocytes: 0 10*3/uL (ref 0.00–0.07)
Basophils Absolute: 0 10*3/uL (ref 0.0–0.1)
Basophils Relative: 0 %
Eosinophils Absolute: 0 10*3/uL (ref 0.0–0.5)
Eosinophils Relative: 0 %
HCT: 22.2 % — ABNORMAL LOW (ref 36.0–46.0)
Hemoglobin: 7 g/dL — ABNORMAL LOW (ref 12.0–15.0)
Immature Granulocytes: 0 %
Lymphocytes Relative: 53 %
Lymphs Abs: 0.3 10*3/uL — ABNORMAL LOW (ref 0.7–4.0)
MCH: 27.9 pg (ref 26.0–34.0)
MCHC: 31.5 g/dL (ref 30.0–36.0)
MCV: 88.4 fL (ref 80.0–100.0)
Monocytes Absolute: 0 10*3/uL — ABNORMAL LOW (ref 0.1–1.0)
Monocytes Relative: 3 %
Neutro Abs: 0.3 10*3/uL — CL (ref 1.7–7.7)
Neutrophils Relative %: 44 %
Platelets: 30 10*3/uL — ABNORMAL LOW (ref 150–400)
RBC: 2.51 MIL/uL — ABNORMAL LOW (ref 3.87–5.11)
RDW: 21.4 % — ABNORMAL HIGH (ref 11.5–15.5)
Smear Review: DECREASED
WBC: 0.6 10*3/uL — CL (ref 4.0–10.5)
nRBC: 3.4 % — ABNORMAL HIGH (ref 0.0–0.2)

## 2021-09-05 LAB — MISC LABCORP TEST (SEND OUT): Labcorp test code: 140818

## 2021-09-05 LAB — GLUCOSE, CAPILLARY
Glucose-Capillary: 205 mg/dL — ABNORMAL HIGH (ref 70–99)
Glucose-Capillary: 160 mg/dL — ABNORMAL HIGH (ref 70–99)
Glucose-Capillary: 105 mg/dL — ABNORMAL HIGH (ref 70–99)
Glucose-Capillary: 141 mg/dL — ABNORMAL HIGH (ref 70–99)
Glucose-Capillary: 136 mg/dL — ABNORMAL HIGH (ref 70–99)
Glucose-Capillary: 162 mg/dL — ABNORMAL HIGH (ref 70–99)

## 2021-09-05 LAB — CULTURE, BLOOD (ROUTINE X 2)
Culture: NO GROWTH
Culture: NO GROWTH
Special Requests: ADEQUATE
Special Requests: ADEQUATE

## 2021-09-05 LAB — PREPARE PLATELET PHERESIS: Unit division: 0

## 2021-09-05 LAB — BPAM PLATELET PHERESIS
Blood Product Expiration Date: 202304012359
ISSUE DATE / TIME: 202303301152
Unit Type and Rh: 6200

## 2021-09-05 IMAGING — CT CT HEAD W/O CM
4 of 5 series · 15 of 47 positions shown, 17 images · non-contrast
Comparison: [DATE].

CLINICAL DATA: Altered mental status.



[Series 3: head without · axial · non-contrast · 0.42mm/px · z∈[-144,-44]mm · 4 of 34 slices shown]
[im 7/34  brain]
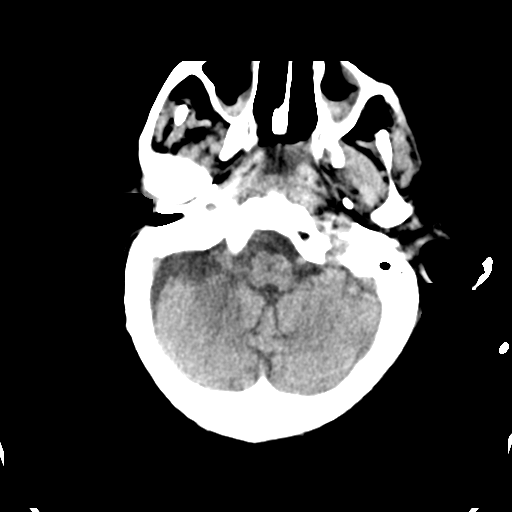
[im 14/34  brain]
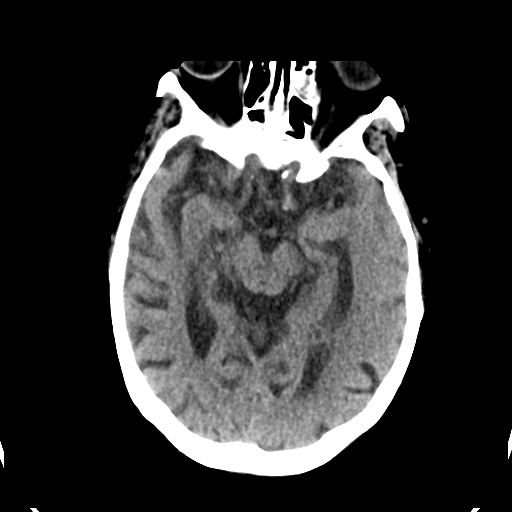
[im 20/34  brain]
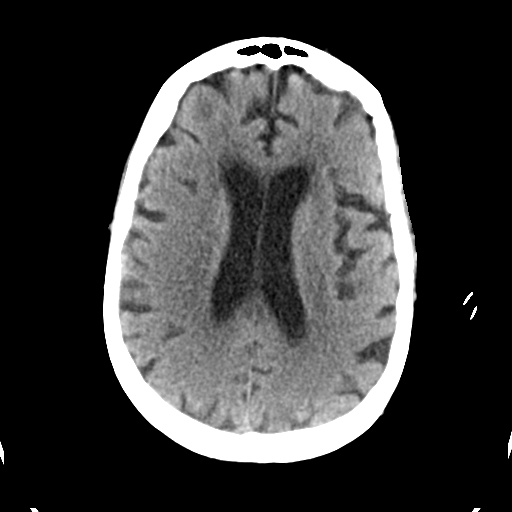
[im 27/34  brain]
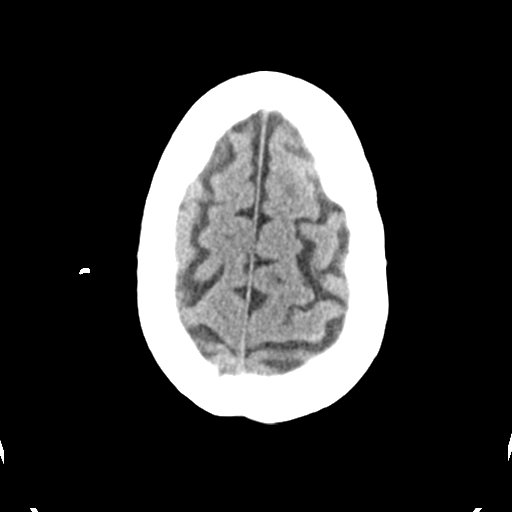

[Series 5: head without cor · coronal · non-contrast · 0.34mm/px · 3 of 69 slices shown]
[im 23/69  brain]
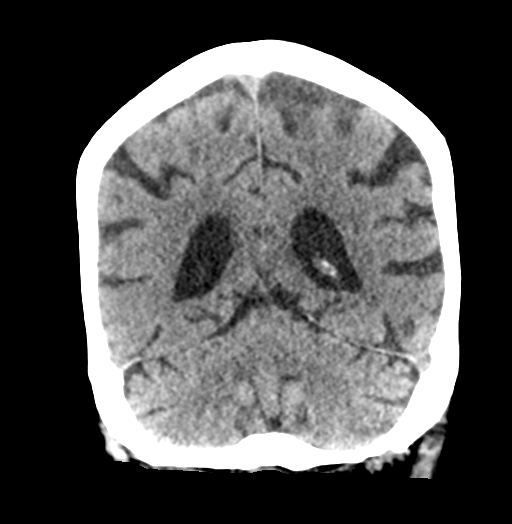
[im 31/69  brain]
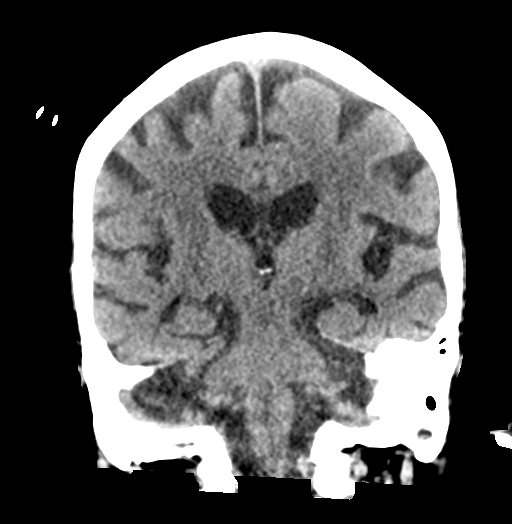
[im 38/69  brain]
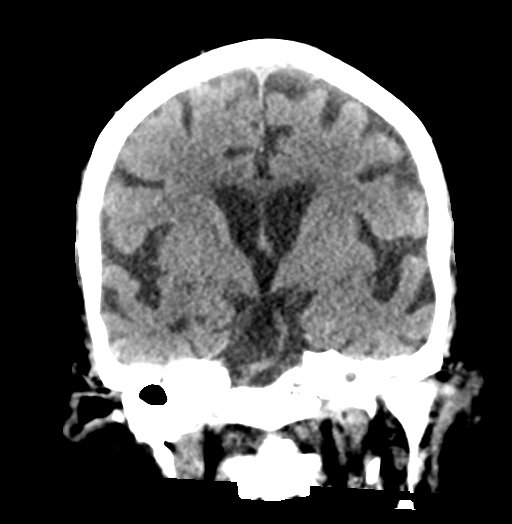

[Series 6: head without sag · sagittal · non-contrast · 0.36mm/px · 3 of 52 slices shown]
[im 18/52  brain]
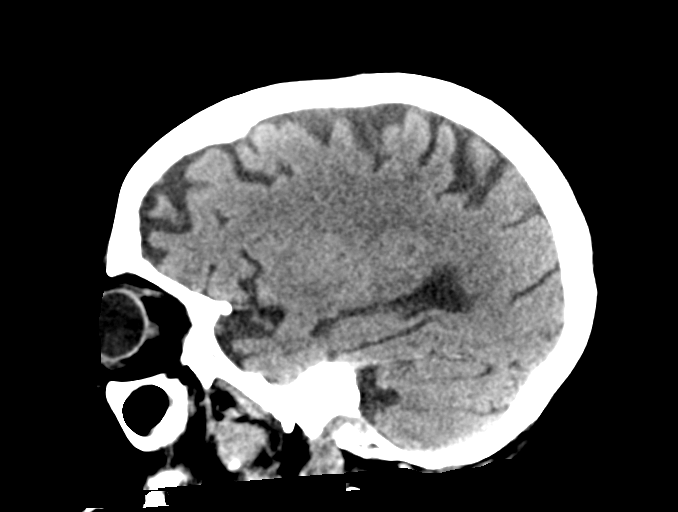
[im 26/52  brain]
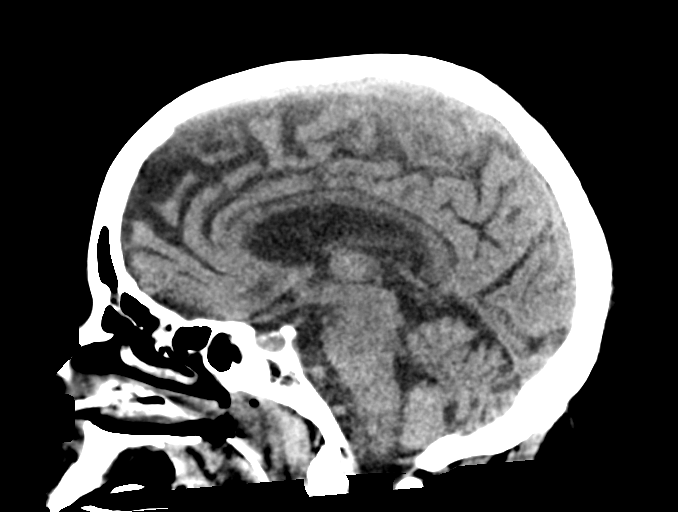
[im 35/52  brain]
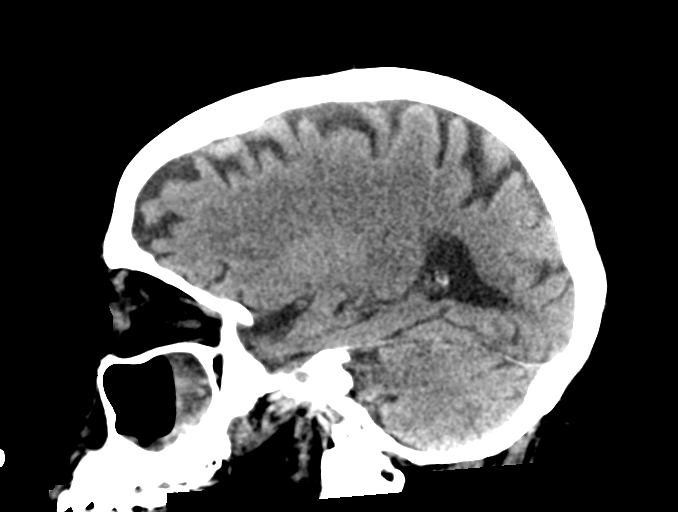

[Series 7: true axial · axial · 0.33mm/px · z∈[-133,-29]mm · 5 of 33 slices shown, 7 images]
[im 6/33  brain]
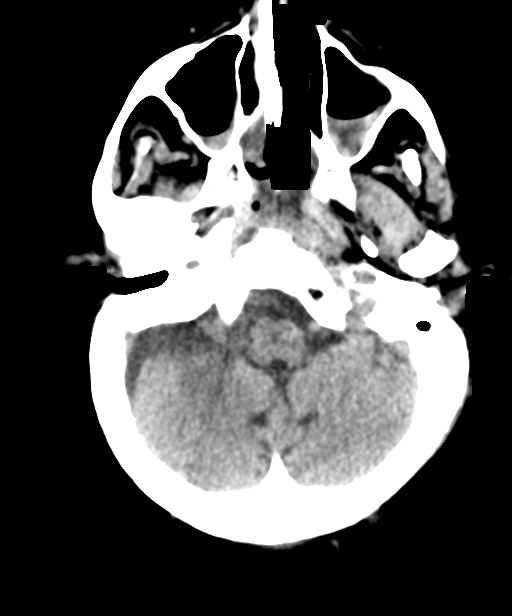
[im 6/33  bone]
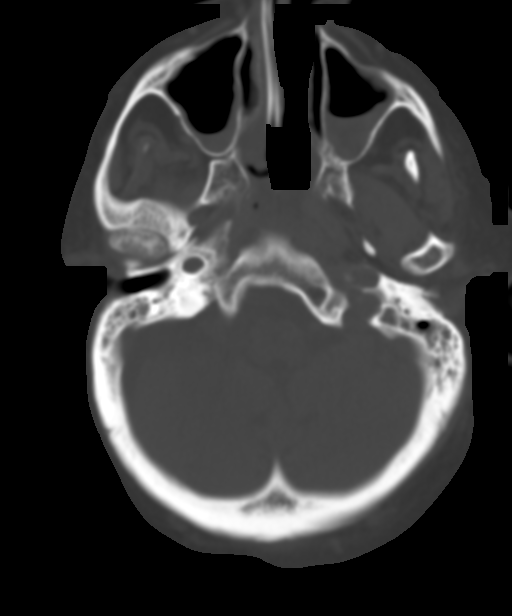
[im 11/33  brain]
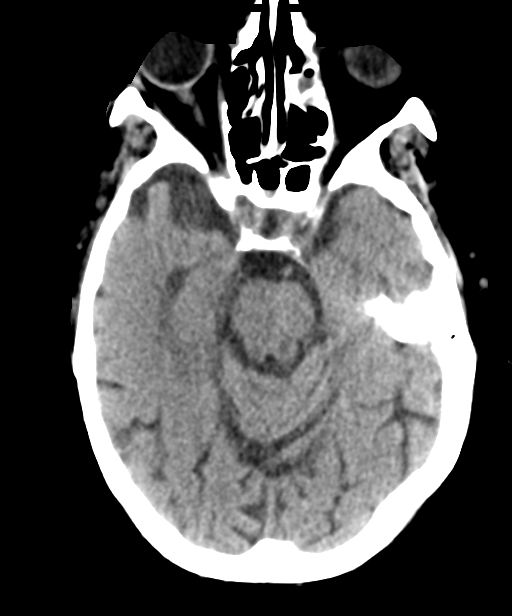
[im 17/33  brain]
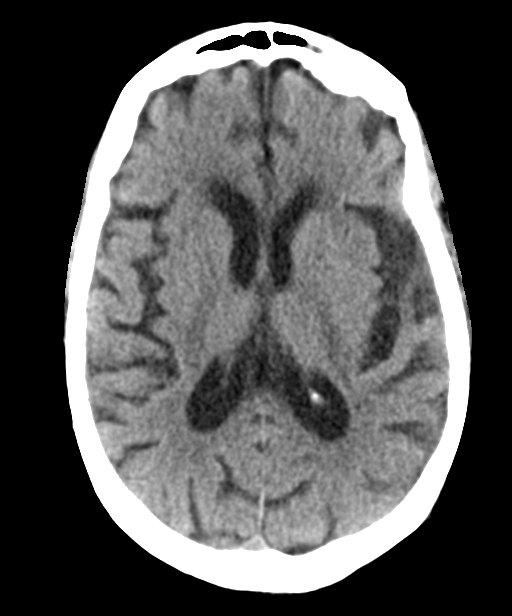
[im 22/33  brain]
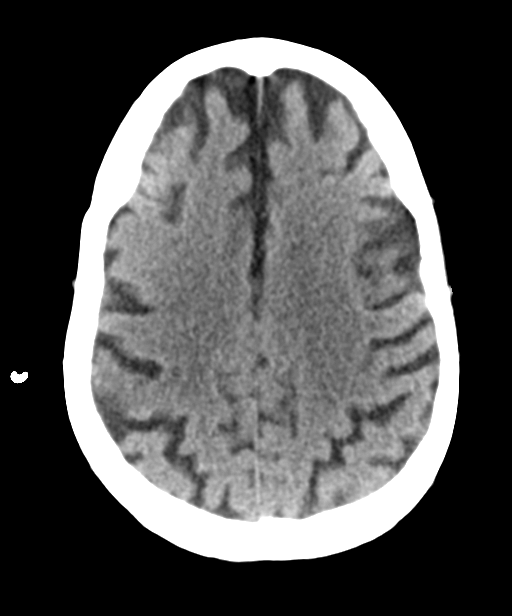
[im 27/33  brain]
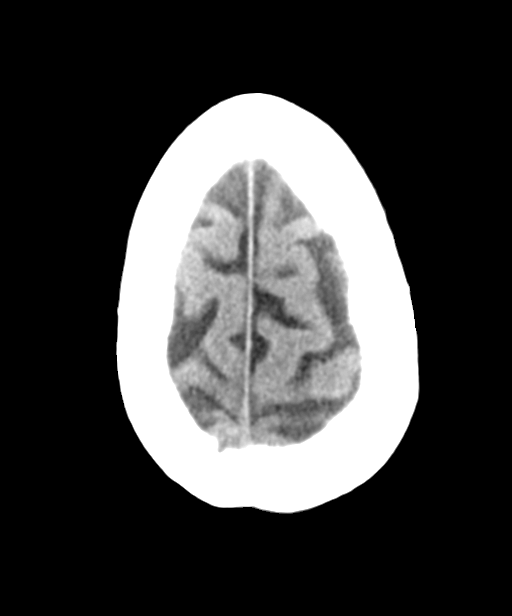
[im 27/33  bone]
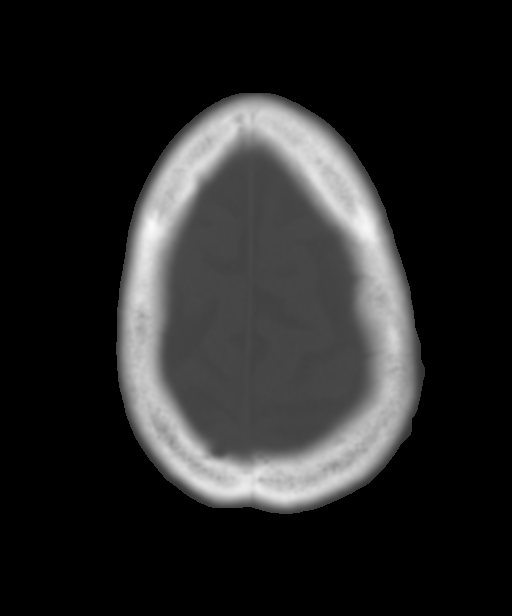

[15 of 47 positions shown; findings below may reference images not displayed]

FINDINGS: Brain: Mild diffuse cortical atrophy is noted. No mass effect or
midline shift is noted. Ventricular size is within normal limits.
There is no evidence of mass lesion, hemorrhage or acute infarction.

Vascular: No hyperdense vessel or unexpected calcification.

Skull: Normal. Negative for fracture or focal lesion.

Sinuses/Orbits: Left frontal, ethmoid and maxillary sinusitis is
noted.

Other: Fluid is noted in the mastoid air cells bilaterally.
IMPRESSION: No acute intracranial abnormality seen.

## 2021-09-05 MED ORDER — DEXMEDETOMIDINE HCL IN NACL 400 MCG/100ML IV SOLN
0.4000 ug/kg/h | INTRAVENOUS | Status: DC
  Filled 2021-09-05: qty 100

## 2021-09-05 MED ORDER — INSULIN GLARGINE-YFGN 100 UNIT/ML ~~LOC~~ SOLN
20.0000 [IU] | Freq: Every day | SUBCUTANEOUS | Status: DC
  Administered 2021-09-05 – 2021-09-21 (×17): 20 [IU] via SUBCUTANEOUS
  Filled 2021-09-05 (×17): qty 0.2

## 2021-09-05 MED ORDER — SODIUM CHLORIDE 0.9 % IV SOLN
20.0000 mg | Freq: Every day | INTRAVENOUS | Status: DC
  Administered 2021-09-05 – 2021-09-21 (×17): 20 mg via INTRAVENOUS
  Filled 2021-09-05 (×19): qty 2

## 2021-09-05 MED ORDER — DEXAMETHASONE SODIUM PHOSPHATE 10 MG/ML IJ SOLN: 20.0000 mg | Freq: Every day | INTRAMUSCULAR | Status: DC

## 2021-09-05 MED ORDER — TBO-FILGRASTIM 480 MCG/0.8ML ~~LOC~~ SOSY
480.0000 ug | PREFILLED_SYRINGE | Freq: Once | SUBCUTANEOUS | Status: AC
  Administered 2021-09-06: 480 ug via SUBCUTANEOUS
  Filled 2021-09-05: qty 0.8

## 2021-09-05 NOTE — Progress Notes (Addendum)
HEMATOLOGY-ONCOLOGY PROGRESS NOTE ? ?ASSESSMENT AND PLAN: ?61 yo female  ?  ?Acute encephalopathy, ? 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  ?Recommendations: ?-CBC reviewed.  Her WBC is 0.6, hemoglobin 7.0, platelets 30,000.  ANC is 0.3 today.  ?-We will continue Granix 480 mcg subcu today and again tomorrow.  Thereafter, we will hold on Sunday 4/2 in anticipation of chemotherapy on Monday 4/3. ?-Transfuse PRBCs per ICU protocol.  Transfuse platelets for platelet count less than 20,000 or active bleeding. ?-Monitor CBC with differential daily. ?-IL-2 receptor level came back elevated which supports the diagnosis of [place].  Will resume dexamethasone 20 mg IV daily.  We will plan for etoposide on Monday 4/3. ?-We again reviewed with the patient's niece that she is critically ill and the possibility of death is very high even with treatment for [place].  Palliative care is following and plans to have another family meeting on Saturday. ? ?Mikey Bussing, DNP, AGPCNP-BC, AOCNP ? ?SUBJECTIVE: Remains on the ventilator.  Sedation has been weaned but patient not alert.  No bleeding.  Going for CT of the head.  Niece is at the bedside today. ? ?REVIEW OF SYSTEMS:   ?Review of Systems  ?Reason unable to perform ROS: Sedated, on ventilator.  ? ?PHYSICAL EXAMINATION: ?ECOG PERFORMANCE STATUS: 4 - Bedbound ? ?Vitals:  ? 09/05/21 1136 09/05/21 1200  ?BP: 102/73 107/74  ?Pulse: (!) 101 98  ?Resp: (!) 26 18  ?Temp:  97.6 ?F (36.4 ?C)  ?SpO2: 100% 100%  ? ?Filed Weights  ? 08/28/21 0356 08/29/21 0500 08/30/21 0500  ?Weight: 70.4 kg 69.2 kg 70 kg  ? ? ?Intake/Output from  previous day: ?03/30 0701 - 03/31 0700 ?In: 2219.4 [I.V.:321.4; Blood:273; NG/GT:1320; IV [id number] ?Out: 2250 [Urine:2100; Stool:150] ? ?Physical Exam ?Vitals reviewed.  ?Constitutional:   ?   Comments: Sedated, on ventilator ?Generalized edema noted  ?HENT:  ?   Head: Normocephalic.  ?Musculoskeletal:  ?   Right lower leg: Edema present.  ?   Left lower leg: Edema present.  ?Skin: ?   General: Skin is warm and dry.  ?   Comments: Ecchymoses to right neck  ?Neurological:  ?   Comments: Sedated.  ? ? ?LABORATORY DATA:  ?I have reviewed the data as listed ? ?  Latest Ref Rng & Units 09/04/2021  ?  9:45 AM 09/03/2021  ?  6:07 AM 09/02/2021  ?  4:39 AM  ?CMP  ?Glucose 70 - 99 mg/dL 131   199   130    ?BUN 8 - 23 mg/dL 91   92   90    ?Creatinine 0.44 - 1.00 mg/dL 0.98   1.12   1.26    ?Sodium 135 - 145 mmol/L 145   146   145    ?Potassium 3.5 - 5.1 mmol/L 4.5   3.8   4.1    ?Chloride 98 - 111 mmol/L 109   109   111    ?CO2 22 - 32 mmol/L 28   31   29     ?Calcium 8.9 - 10.3 mg/dL 9.3   8.8   8.8    ?Total Protein 6.5 - 8.1 g/dL 5.4   5.0   4.7    ?Total Bilirubin 0.3 -  1.2 mg/dL 0.9   0.9   1.1    ?Alkaline Phos 38 - 126 U/L 300   308   415    ?AST 15 - 41 U/L 78   70   111    ?ALT 0 - 44 U/L 51   44   52    ? ? ?Lab Results  ?Component Value Date  ? WBC 0.6 (LL) 09/05/2021  ? HGB 7.0 (L) 09/05/2021  ? HCT 22.2 (L) 09/05/2021  ? MCV 88.4 09/05/2021  ? PLT 30 (L) 09/05/2021  ? NEUTROABS 0.3 (LL) 09/05/2021  ? ? ?Lab Results  ?Component Value Date  ? CEA1 36.4 (H) 08/28/2021  ? ? ?DG Chest 1 View ? ?Result Date: 08/06/2021 ?CLINICAL DATA:  Dizziness, nausea and vomiting, bilateral leg pain EXAM: CHEST  1 VIEW COMPARISON:  04/16/2021 FINDINGS: Single frontal view of the chest demonstrates an unremarkable cardiac silhouette. No acute airspace disease, effusion, or pneumothorax. No acute bony abnormalities. IMPRESSION: 1. No acute intrathoracic process. Electronically Signed   By: Randa Ngo M.D.   On: 08/06/2021 19:41   ? ?CT HEAD WO CONTRAST (5MM) ? ?Result Date: 08/27/2021 ?CLINICAL DATA:  61 year old female with history of delirium. Lower abdominal pain and constipation. EXAM: CT HEAD WITHOUT CONTRAST TECHNIQUE: Contiguous axial images were obtained from the base of the skull through the vertex without intravenous contrast. RADIATION DOSE REDUCTION: This exam was performed according to the departmental dose-optimization program which includes automated exposure control, adjustment of the mA and/or kV according to patient size and/or use of iterative reconstruction technique. COMPARISON:  Head CT 08/06/2021. FINDINGS: Brain: Mild cerebral atrophy. Patchy and confluent areas of decreased attenuation are noted throughout the deep and periventricular white matter of the cerebral hemispheres bilaterally, compatible with chronic microvascular ischemic disease. No evidence of acute infarction, hemorrhage, hydrocephalus, extra-axial collection or mass lesion/mass effect. Vascular: No hyperdense vessel or unexpected calcification. Skull: Normal. Negative for fracture or focal lesion. Sinuses/Orbits: No acute finding. Other: None. IMPRESSION: 1. No acute intracranial abnormalities. 2. Mild cerebral atrophy with chronic microvascular ischemic changes in the cerebral white matter, as above. Electronically Signed   By: Vinnie Langton M.D.   On: 08/21/2021 06:31  ? ?CT Head Wo Contrast ? ?Result Date: 08/06/2021 ?CLINICAL DATA:  Dizziness, nausea and vomiting EXAM: CT HEAD WITHOUT CONTRAST TECHNIQUE: Contiguous axial images were obtained from the base of the skull through the vertex without intravenous contrast. RADIATION DOSE REDUCTION: This exam was performed according to the departmental dose-optimization program which includes automated exposure control, adjustment of the mA and/or kV according to patient size and/or use of iterative reconstruction technique. COMPARISON:  03/26/2021, 04/17/2021 FINDINGS: Brain: No acute infarct or  hemorrhage. Lateral ventricles and midline structures are stable. No acute extra-axial fluid collections. No mass effect. Vascular: No hyperdense vessel or unexpected calcification. Skull: Normal. Negative for fracture or focal lesion. Sinuses/Orbits: No acute finding. Other: None. IMPRESSION: 1. No acute intracranial process. Electronically Signed   By: Randa Ngo M.D.   On: 08/06/2021 19:40  ? ?CT ABDOMEN W CONTRAST ? ?Result Date: 09/03/2021 ?CLINICAL DATA:  Candidiasis.  Evaluate for intra-abdominal abscess. EXAM: CT ABDOMEN WITH CONTRAST TECHNIQUE: Multidetector CT imaging of the abdomen was performed using the standard protocol following bolus administration of intravenous contrast. RADIATION DOSE REDUCTION: This exam was performed according to the departmental dose-optimization program which includes automated exposure control, adjustment of the mA and/or kV according to patient size and/or use of iterative reconstruction technique. CONTRAST:  162mL  OMNIPAQUE IOHEXOL 300 MG/ML  SOLN COMPARISON:  08/29/2021 FINDINGS: Lower chest: Significant interval progression of bilateral interstitial and airspace opacities within both lungs. Hepatobiliary: No focal liver abnormality. Small stone within the gallbladder measures 3 mm. No signs of gallbladder wall thickening or bile duct dilatation. Pancreas: Unremarkable. No pancreatic ductal dilatation or surrounding inflammatory changes. Spleen: Normal in size without focal abnormality. Adrenals/Urinary Tract: Normal right adrenal gland. 2 cm left adrenal nodule is identified measuring 22.6 Hounsfield units, image 37/3. Unchanged from previous exam. No nephrolithiasis, hydronephrosis, or mass. Stomach/Bowel: Enteric tube tip is in the body of the stomach. Enteric contrast material is noted within the gastric lumen. The visualized bowel loops are nondilated. No wall thickening or bowel inflammation identified. Vascular/Lymphatic: No significant vascular findings are  present. No enlarged abdominal or pelvic lymph nodes. Other: No free fluid or fluid collections. No signs of intra-abdominal abscess. Diffuse subcutaneous edema is identified compatible with anasarca. Musculoskele

## 2021-09-05 NOTE — Progress Notes (Signed)
?   ? ?[facility] ? ?Date of Admission:  08/16/2021    ?       ?Reason for visit: Follow up on candidemia ? ?Current antibiotics: ?Micafungin 3/24-- present ?Cefepime 3/28-- present ? ?ASSESSMENT:   ? ?61 y.o. female admitted with: ? ?Candida glabrata fungemia: Blood cultures were positive on 3/23 and 1 out of 4 bottles.  This was presumably from her PICC line that was initially placed 3/18 and removed 3/25.  Blood cultures prior to this were negative from 3/12 and 3/16.  Repeat blood cultures 3/26 remain no growth to date. ?[place]: Etoposide has been on hold due to candidemia and steroids have been reduced as well.  Diagnosis of [place] appears consistent based on lab work including elevated ferritin, elevated I L-2, cytopenias, elevated LFTs, and elevated triglycerides. ?Pancytopenia: Remains neutropenic with ANC 300, platelets 30, hemoglobin 7. ?Bilateral airspace opacities: Status post treatment for aspiration pneumonia with ampicillin-sulbactam 3/20 through 3/24. ?Encephalopathy ?RA/dermatomyositis ? ?RECOMMENDATIONS:   ? ?Continue antifungal therapy with micafungin ?Will eventually need ophthalmology evaluation in the setting of candidemia as well  ?Continue cefepime for now in the setting of profound neutropenia ?Agree with respiratory cultures given bilateral airspace disease noted on recent CT scan.  Differential includes aspiration pneumonitis, bacterial pneumonia, DAH given thrombocytopenia.  Could consider BAL with additional cultures, pneumocystis smear by DFA, and Aspergillus galactomannan to rule out other unlikely causes ?Recommend targeted therapy for [place] at this time and consideration of referral to tertiary care center ?Will follow ? ? ?Principal Problem: ?  Candidemia ([place]) ?Active Problems: ?  Hyperlipidemia ?  Hypertension ?  Generalized weakness ?  Rheumatoid arthritis flare (HCC) ?  Depression with anxiety ?  Lactic acidosis ?  Pancytopenia ([place]) ?  Volume depletion ?   Long-term corticosteroid use ?  Open wound of right hand ?  Adrenal nodule ([place]) ?  Pressure injury of skin ?  High anion gap metabolic acidosis ?  Altered mental status ?  Metabolic encephalopathy ?  Encephalitis ?  Acute encephalopathy ?  [place] (hemophagocytic lymphohistiocytosis) ([place]) ?  Goals of care, counseling/discussion ? ? ? ?MEDICATIONS:   ? ?Scheduled Meds: ? ARIPiprazole  5 mg Per Tube QHS  ? chlorhexidine gluconate (MEDLINE KIT)  15 mL Mouth Rinse BID  ? Chlorhexidine Gluconate Cloth  6 each Topical Q0600  ? clonazepam  2 mg Per Tube BID  ? dexamethasone (DECADRON) injection  1 mg Intravenous Q24H  ? escitalopram  20 mg Per Tube Daily  ? feeding supplement (PROSource TF)  90 mL Per Tube BID  ? folic acid  1 mg Per Tube Daily  ? insulin aspart  0-15 Units Subcutaneous Q4H  ? insulin aspart  4 Units Subcutaneous Q4H  ? insulin glargine-yfgn  20 Units Subcutaneous Daily  ? levETIRAcetam  500 mg Per Tube BID  ? mouth rinse  15 mL Mouth Rinse 10 times per day  ? metoprolol tartrate  12.5 mg Per Tube BID  ? multivitamin with minerals  1 tablet Per Tube Daily  ? mupirocin ointment   Topical BID  ? nutrition supplement (JUVEN)  1 packet Per Tube BID BM  ? pantoprazole sodium  40 mg Per Tube Daily  ? sodium chloride flush  10-40 mL Intracatheter Q12H  ? Tbo-filgastrim (GRANIX) SQ  480 mcg Subcutaneous Daily  ? thiamine  100 mg Per Tube Daily  ? ?Continuous Infusions: ? sodium chloride 250 mL (08/30/21 2121)  ? sodium chloride    ?  ceFEPime (MAXIPIME) IV Stopped (09/05/21 0023)  ? feeding supplement (VITAL 1.5 CAL) 1,000 mL (09/04/21 1704)  ? fentaNYL infusion INTRAVENOUS 100 mcg/hr (09/05/21 0700)  ? micafungin [facility]) IV Stopped (09/04/21 2350)  ? midazolam 3 mg/hr (09/05/21 0700)  ? ?PRN Meds:.acetaminophen (TYLENOL) oral liquid 160 mg/5 mL **OR** acetaminophen **OR** acetaminophen, alteplase, fentaNYL, heparin lock flush, heparin lock flush, ipratropium-albuterol, lip balm, midazolam, sodium chloride flush,  sodium chloride flush, sodium chloride flush ? ?SUBJECTIVE:  ? ?24 hour events:  ?No acute events were noted ?Remains deeply sedated in the ICU ?Neutropenia persists and is worsening ?Blood cultures remain negative ?Afebrile, Tmax 100.3 ?Remains intubated with FiO2 40% ? ? ?Remains intubated and sedated in the ICU ? ?Review of Systems  ?Unable to perform ROS: Intubated  ? ?  ?OBJECTIVE:  ? ?Blood pressure 116/78, pulse (!) 111, temperature 99.5 ?F (37.5 ?C), temperature source Oral, resp. rate (!) 22, height 5' 3" (1.6 m), weight 70 kg, SpO2 100 %. ?Body mass index is 27.34 kg/m?. ? ?Physical Exam ?Constitutional:   ?   Comments: Critically ill-appearing, sedated, intubated, lying in bed  ?HENT:  ?   Head: Normocephalic and atraumatic.  ?Neck:  ?   Comments: ET tube in place ?Cardiovascular:  ?   Rate and Rhythm: Normal rate and regular rhythm.  ?Pulmonary:  ?   Comments: Intubated breath sounds, symmetric chest rise and fall, appears fairly comfortable on the ventilator. ?Abdominal:  ?   General: There is no distension.  ?   Palpations: Abdomen is soft.  ?   Tenderness: There is no abdominal tenderness.  ?Musculoskeletal:     ?   General: Swelling present.  ?   Cervical back: Normal range of motion and neck supple.  ?Skin: ?   General: Skin is warm and dry.  ?   Comments: Anasarca noted with petechial hemorrhages noted  ?Neurological:  ?   Comments: Sedated  ? ? ? ?Lab Results: ?Lab Results  ?Component Value Date  ? WBC 0.6 (LL) 09/05/2021  ? HGB 7.0 (L) 09/05/2021  ? HCT 22.2 (L) 09/05/2021  ? MCV 88.4 09/05/2021  ? PLT 30 (L) 09/05/2021  ?  ?Lab Results  ?Component Value Date  ? NA 145 09/04/2021  ? K 4.5 09/04/2021  ? CO2 28 09/04/2021  ? GLUCOSE 131 (H) 09/04/2021  ? BUN 91 (H) 09/04/2021  ? CREATININE 0.98 09/04/2021  ? CALCIUM 9.3 09/04/2021  ? GFRNONAA >60 09/04/2021  ? GFRAA  08/24/2008  ?  >60        ?The eGFR has been calculated ?using the MDRD equation. ?This calculation has not been ?validated in all  clinical ?situations. ?eGFR's persistently ?<60 mL/min signify ?possible Chronic Kidney Disease.  ?  ?Lab Results  ?Component Value Date  ? ALT 51 (H) 09/04/2021  ? AST 78 (H) 09/04/2021  ? ALKPHOS 300 (H) 09/04/2021  ? BILITOT 0.9 09/04/2021  ? ? ?   ?Component Value Date/Time  ? CRP 19.4 (H) 08/26/2021 1134  ? ? ?   ?Component Value Date/Time  ? ESRSEDRATE 6 08/26/2021 1134  ? ?  ?I have reviewed the micro and lab results in Epic. ? ?Imaging: ?CT ABDOMEN W CONTRAST ? ?Result Date: 09/03/2021 ?CLINICAL DATA:  Candidiasis.  Evaluate for intra-abdominal abscess. EXAM: CT ABDOMEN WITH CONTRAST TECHNIQUE: Multidetector CT imaging of the abdomen was performed using the standard protocol following bolus administration of intravenous contrast. RADIATION DOSE REDUCTION: This exam was performed according to the departmental dose-optimization program which  includes automated exposure control, adjustment of the mA and/or kV according to patient size and/or use of iterative reconstruction technique. CONTRAST:  168m OMNIPAQUE IOHEXOL 300 MG/ML  SOLN COMPARISON:  08/26/2021 FINDINGS: Lower chest: Significant interval progression of bilateral interstitial and airspace opacities within both lungs. Hepatobiliary: No focal liver abnormality. Small stone within the gallbladder measures 3 mm. No signs of gallbladder wall thickening or bile duct dilatation. Pancreas: Unremarkable. No pancreatic ductal dilatation or surrounding inflammatory changes. Spleen: Normal in size without focal abnormality. Adrenals/Urinary Tract: Normal right adrenal gland. 2 cm left adrenal nodule is identified measuring 22.6 Hounsfield units, image 37/3. Unchanged from previous exam. No nephrolithiasis, hydronephrosis, or mass. Stomach/Bowel: Enteric tube tip is in the body of the stomach. Enteric contrast material is noted within the gastric lumen. The visualized bowel loops are nondilated. No wall thickening or bowel inflammation identified.  Vascular/Lymphatic: No significant vascular findings are present. No enlarged abdominal or pelvic lymph nodes. Other: No free fluid or fluid collections. No signs of intra-abdominal abscess. Diffuse subcutaneous edema i

## 2021-09-05 NOTE — Progress Notes (Signed)
? ?NAME:  Carrie [name], MRN:  [id number], DOB:  Oct 07, 1960, LOS: 66 ?ADMISSION DATE:  08/12/2021, CONSULTATION DATE:  3/17 ?REFERRING MD:  Darrick Meigs, CHIEF COMPLAINT:  Confusion  ? ?History of Present Illness:  ?Patient is a 61 year old female with PMHx as below iniitally admitted for AMS, abdominal pain, constipation, decreased PO intake with dehydration. There were reports that patient experienced similar symptoms when her prednisone was tapered down. She was noted to be pancytopenic for which oncology was consulted. Patient thought to have folic acid deficiency. ADAMTS13 nl. 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 Bone marrow biopsy obtained - hypoproductive without blast or malignant cells noted.  ?Initial CT Head and MRI Brain negative for acute intracranial abnormalities and patient's encephalopathy was thought to be secondary to steroid withdrawal. However, on 3/16, neurology consulted for progressively worsening encephalopathy. Patient recommended for LP and start empiric abx coverage for meningitis.Patient also started on acyclovir for possible viral encephalitis. Recommended for MR imaging of spine with elictable clonus in RUE and BLE.  ?DIC panel obtained on 3/17 with elevated D-dimer and low fibronogen with several small bruises on arms with worsening pancytopenia - patient was given 1u pRBC and cryo.  Experienced 3-min seizure activity after attempted central access placement - required intubation for airway protection.  ?LP performed on 3/18 - CSF non-infectious. Patient transferred to [facility] for LTM EEG monitoring  ? ?Pertinent  Medical History  ?Rheumatoid arthritis ?Dermatomyositis ?Fatty liver ?Hyperlipidemia ?Hypertension ?Rhabdomyolysis ? ?Significant Hospital Events: ?Including procedures, antibiotic start and stop dates in addition to other pertinent events   ?03/2021 labs: ANA positive, Jo-1 neg, centromere neg, ds DNA  neg, RNP positive, SSA/SSB neg, RF positive, chromatin Ab neg, CCP neg ?MRI 3/13-no acute findings ?3/17 bone marrow biopsy > hypercellular bone marrow with trilineage hematopoiesis, comment: non-specific, follow up cytogenetic studies ?3/12 CT abdomen and pelvis-no acute findings, adrenal nodule, diverticulosis ?3/12 CT head without contrast-no acute findings ?3/16 EEG-moderate diffuse encephalopathy of nonspecific etiology, no seizures or epileptiform discharges noted during the recording ?3/17 intubation ?3/18 LP performed: no leukocytes, protein elevated, transfer to [facility] for LTM EEG; PICC line placed ?3/20 remains on pulse dose steroids; intermittently tolerating pressure support ?3/21 started on IVIG ?3/24 seen by Heme/Onc, 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 ?3/26 Candida fungemia for which started on micafungin. Dex and Etoposide stopped. [place] discussion with family - palliative care consulted  ?3/28 Febrile overnight for which cefepime added. WBC dropped to 1.4; started on Granix inj. ? ?Micro ?3/18 csf > neg to date ?3/18 csf fungus > neg  ?3/18 csf vzv > neg ?3/18 csf hsv > neg ?3/18 csf cryptococcal ag > neg ?3/18 csf vdrl > neg ?3/18 csf hsv > neg ?3/18  > lyme disease > negative  ?3/16 blood culture > neg to date ?3/12 blood > staph auricularis 1/4 bottles ?3/12 sars cov 2/flu > negative ?3/16 > parvoB19 IgG pos, IgM neg ?3/16 > EBV IgG pos, IgM neg ?3/16 > CMV IgG pos, IgM neg ?3/16 > HHV6 IgG pos ?3/17 > bone marrow biopsy > hypercellular with trilineage hematopoiesis ?3/18 blastomyces antigen > negative ?3/18 CSF fungal culture > negative ?3/23 blood culture 1/4 > c. Glabrata ?3/26 blood culture > pending ? ?Abx ?Rocephin 3/12 ?Acyclovir 3/16>3/19  ?Unasyn 3/19 > 3/24 ?Micafungin 3/24 >  ?Cefepime 3/28 >  ? ?Interim History / Subjective:  ?No acute issues.  Deeply sedated.  ? ?Elevated IL-2 receptor level.  ? ?Objective   ?Blood pressure 116/78,  pulse (!) 111, temperature 99.5 ?F (37.5 ?C), temperature source Oral, resp. rate (!) 22, height _0  (1.6 m), weight 70 kg, SpO2 100 %. ?   ?Vent Mode: PRVC ?FiO2 (%):  [40 %] 40 % ?Set Rate:  [16 bmp] 16 bmp ?Vt Set:  [420 mL] 420 mL ?PEEP:  [5 cmH20] 5 cmH20 ?Plateau Pressure:  [16 cmH20-19 cmH20] 19 cmH20  ? ?Intake/Output Summary (Last 24 hours) at 09/05/2021 0855 ?Last data filed at 09/05/2021 0700 ?Gross per 24 hour  ?Intake 2151.35 ml  ?Output 2250 ml  ?Net -98.65 ml  ? ?Filed Weights  ? 08/28/21 0356 08/29/21 0500 08/30/21 0500  ?Weight: 70.4 kg 69.2 kg 70 kg  ? ? ?Examination: ?General appearance: 60 y.o., female, intubated ?Eyes: PERRL, not tracking ?HENT: NCAT; MMM ?Neck: Trachea midline; no lymphadenopathy, no JVD ?Lungs: rhonchorous equal chest rise ?CV: RRR, no murmur  ?Abdomen: Soft, non-tender; non-distended, BS present  ?Extremities: anasarcic, warm ?Neuro: deeply sedated ? ?Labs and imaging reviewed ? ? ?Resolved Hospital Problem list   ?Aspiration pneumonia ?Circulatory shock likely 2/2 sedation - improved off sedation ? 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 Repeat blood cultures neg thus far.  ?- ID consulted, appreciate recommendations  ?- Continue Micafungin; started cefepime on 3/28 for neutropenic fever ?- Holding etoposide at this time  ?- F/u repeat blood cultures and sensitivities ?- I think it would be reasonable to attempt to transfer her so she can be evaluated by rheumatology, oncology, ID at tertiary center to guide choice of ramping up treatment of MAS and underlying CTD and/or treating as if primary [place]. Could benefit as well from ophtho eval there eventually.  ? ?# Acute respiratory faiure with hypoxemia  ?With multifocal  infiltrates which could be due to ARDS whether from aspiration pneumonitis, bacterial pna, fungemia, or alveolar hemorrhage.  ?- tracheal aspirate, will consider bronch/BAL prior to ramping up immunosupprression ?- wean versed, can trial precedex if still needs sedation ?- target RASS -1 ?- Full mechanical vent support ?- VAP prevention ?- Daily WUA/SBT ? ?Pancytopenia> candidemia vs HLH/MAS ?Does have some gingival bleeding this morning with oral care.  This is in setting of recent chemo. Did receive 1 dose of Granix. ?- Heme/onc consulted, appreciate recommendations  ?- Monitor for bleeding ?- Transfuse PRBC for Hgb < 7 gm/dL or platelet <20 or with active bleeding ? ?Hypervolemia ?Diuresed 3.6L over past 24hrs. Still net +1L positive. Renal function stable. ?- diuresis for net negative fluid balance ?- Trend renal function ?- Strict I&O  ? ?Acute renal failure - improving  ?Hypokalemia - resolved ?- Monitor BMET and UOP ?- Replace electrolytes as needed ? ?Shock liver vs HLH/MAS effect - improving ?- Trend LFTs ? ?Hyperglycemia > improved after adding tube feeding coverage ?- Continue SSI ?- Continue tube feeding coverage ? ?Possible seizure ?- Continue keppra  ? ?Hx of dermatomyositis ?Rheumatoid arthritis ?Chronic steroid use - at risk for adrenal insufficiency ?S/p course of pulse steroids. High risk for adrenal insufficiency ?- IV dexamethasone 64m daily ? ?[place] ?- Family would like to continue with full scope of care at this time. - - Repeat  family meeting Saturday ?- Palliative care consulted, appreciate assistance ? ?Best Practice (right click and "Reselect all SmartList Selections" daily)  ? ?Diet/type: tubefeeds ?DVT prophylaxis: SCD ?GI prophylaxis: PPI ?Lines: see above ?Foley:  N/A and removal ordered  ?Code Status:  full code ?Last date of multidisciplinary goals of care discussion [family too be updated later today] ? ? ?CRITICAL CARE ?Performed by: Maryjane Hurter ? ? ?Total critical care time:  45 minutes ? ?Critical care time was exclusive of separately billable procedures and treating other patients. ? ?Critical care was necessary to treat or prevent imminent or life-threatening deterioratio

## 2021-09-05 NOTE — Progress Notes (Signed)
All sedation off since 1030, at 1200, pt withdraws to pain on right arm and flicker on left leg, but no movement to pain on left arm or right leg. Notified Dr. Thora Lance and questioned if this was different was baseline. Based on previous nursing reports patient was able to move nonpurposeful bilaterally. With MD at bedside, pt did not have any motor reflexes to pain - Stat Head CT ordered. ?

## 2021-09-06 DIAGNOSIS — D701 Agranulocytosis secondary to cancer chemotherapy: Secondary | ICD-10-CM | POA: Diagnosis not present

## 2021-09-06 DIAGNOSIS — G9340 Encephalopathy, unspecified: Secondary | ICD-10-CM | POA: Diagnosis not present

## 2021-09-06 DIAGNOSIS — B377 Candidal sepsis: Secondary | ICD-10-CM | POA: Diagnosis not present

## 2021-09-06 DIAGNOSIS — D761 Hemophagocytic lymphohistiocytosis: Secondary | ICD-10-CM | POA: Diagnosis not present

## 2021-09-06 LAB — CBC WITH DIFFERENTIAL/PLATELET
Abs Immature Granulocytes: 0.01 10*3/uL (ref 0.00–0.07)
Basophils Absolute: 0 10*3/uL (ref 0.0–0.1)
Basophils Relative: 0 %
Eosinophils Absolute: 0 10*3/uL (ref 0.0–0.5)
Eosinophils Relative: 0 %
HCT: 18.7 % — ABNORMAL LOW (ref 36.0–46.0)
Hemoglobin: 5.9 g/dL — CL (ref 12.0–15.0)
Immature Granulocytes: 2 %
Lymphocytes Relative: 59 %
Lymphs Abs: 0.3 10*3/uL — ABNORMAL LOW (ref 0.7–4.0)
MCH: 28.2 pg (ref 26.0–34.0)
MCHC: 31.6 g/dL (ref 30.0–36.0)
MCV: 89.5 fL (ref 80.0–100.0)
Monocytes Absolute: 0 10*3/uL — ABNORMAL LOW (ref 0.1–1.0)
Monocytes Relative: 6 %
Neutro Abs: 0.2 10*3/uL — CL (ref 1.7–7.7)
Neutrophils Relative %: 33 %
Platelets: 23 10*3/uL — CL (ref 150–400)
RBC: 2.09 MIL/uL — ABNORMAL LOW (ref 3.87–5.11)
RDW: 21.2 % — ABNORMAL HIGH (ref 11.5–15.5)
WBC: 0.5 10*3/uL — CL (ref 4.0–10.5)
nRBC: 13 % — ABNORMAL HIGH (ref 0.0–0.2)

## 2021-09-06 LAB — COMPREHENSIVE METABOLIC PANEL
ALT: 76 U/L — ABNORMAL HIGH (ref 0–44)
AST: 76 U/L — ABNORMAL HIGH (ref 15–41)
Albumin: 1.5 g/dL — ABNORMAL LOW (ref 3.5–5.0)
Alkaline Phosphatase: 333 U/L — ABNORMAL HIGH (ref 38–126)
Anion gap: 6 (ref 5–15)
BUN: 92 mg/dL — ABNORMAL HIGH (ref 8–23)
CO2: 28 mmol/L (ref 22–32)
Calcium: 9.3 mg/dL (ref 8.9–10.3)
Chloride: 112 mmol/L — ABNORMAL HIGH (ref 98–111)
Creatinine, Ser: 0.94 mg/dL (ref 0.44–1.00)
GFR, Estimated: >60 mL/min (ref >60)
Glucose, Bld: 184 mg/dL — ABNORMAL HIGH (ref 70–99)
Potassium: 4.9 mmol/L (ref 3.5–5.1)
Sodium: 146 mmol/L — ABNORMAL HIGH (ref 135–145)
Total Bilirubin: 0.4 mg/dL (ref 0.3–1.2)
Total Protein: 5.2 g/dL — ABNORMAL LOW (ref 6.5–8.1)

## 2021-09-06 LAB — GLUCOSE, CAPILLARY
Glucose-Capillary: 176 mg/dL — ABNORMAL HIGH (ref 70–99)
Glucose-Capillary: 191 mg/dL — ABNORMAL HIGH (ref 70–99)
Glucose-Capillary: 238 mg/dL — ABNORMAL HIGH (ref 70–99)
Glucose-Capillary: 221 mg/dL — ABNORMAL HIGH (ref 70–99)
Glucose-Capillary: 208 mg/dL — ABNORMAL HIGH (ref 70–99)
Glucose-Capillary: 190 mg/dL — ABNORMAL HIGH (ref 70–99)

## 2021-09-06 LAB — BODY FLUID CELL COUNT WITH DIFFERENTIAL
Eos, Fluid: 0 %
Lymphs, Fluid: 80 %
Monocyte-Macrophage-Serous Fluid: 12 % — ABNORMAL LOW (ref 50–90)
Neutrophil Count, Fluid: 8 % (ref 0–25)
Total Nucleated Cell Count, Fluid: 22 cu mm (ref 0–1000)

## 2021-09-06 LAB — HEMOGLOBIN AND HEMATOCRIT, BLOOD
HCT: 25.6 % — ABNORMAL LOW (ref 36.0–46.0)
Hemoglobin: 8.5 g/dL — ABNORMAL LOW (ref 12.0–15.0)

## 2021-09-06 LAB — PREPARE RBC (CROSSMATCH)

## 2021-09-06 MED ORDER — METOLAZONE 5 MG PO TABS
5.0000 mg | ORAL_TABLET | Freq: Once | ORAL | Status: DC
  Filled 2021-09-06: qty 1

## 2021-09-06 MED ORDER — SODIUM CHLORIDE 0.9% IV SOLUTION: Freq: Once | INTRAVENOUS | Status: DC

## 2021-09-06 MED ORDER — CLONAZEPAM 0.25 MG PO TBDP
1.0000 mg | ORAL_TABLET | Freq: Two times a day (BID) | ORAL | Status: DC
  Administered 2021-09-06 – 2021-09-21 (×32): 1 mg
  Filled 2021-09-06 (×32): qty 4

## 2021-09-06 MED ORDER — FUROSEMIDE 10 MG/ML IJ SOLN
40.0000 mg | Freq: Once | INTRAMUSCULAR | Status: AC
  Administered 2021-09-06: 40 mg via INTRAVENOUS
  Filled 2021-09-06: qty 4

## 2021-09-06 MED ORDER — MIDAZOLAM HCL 2 MG/2ML IJ SOLN
2.0000 mg | INTRAMUSCULAR | Status: DC | PRN
  Administered 2021-09-06: 2 mg via INTRAVENOUS
  Filled 2021-09-06 (×2): qty 2

## 2021-09-06 MED ORDER — METOLAZONE 5 MG PO TABS
5.0000 mg | ORAL_TABLET | Freq: Once | ORAL | Status: AC
  Administered 2021-09-06: 5 mg
  Filled 2021-09-06: qty 1

## 2021-09-06 NOTE — Progress Notes (Signed)
? ?                                                                                                                                                     ?                                                   ?Daily Progress Note  ? ?Patient Name: Carrie [name]       Date: 09/06/2021 ?DOB: 1960/12/10  Age: 60 y.o. MRN#: [id number] ?Attending Physician: Carrie Hurter, MD ?Primary Care Physician: Carrie [name], Alferd Apa, DO ?Admit Date: 08/25/2021 ? ?Reason for Consultation/Follow-up: Establishing goals of care ? ?Subjective: ?Medical records reviewed including progress notes, labs, imaging. Patient assessed at the beside and updates received from RN. Met with niece/HCPOA Carrie [name] and sister Carrie [name] for family meeting. Patient's son Carrie [name] was able to join the conversation via telephone. ? ?We discussed interval history since our last discussion including medication changes, head CT scan, family's thoughts on obtaining rheumatologist opinion, trends in labs, and plans for bronchoscopy today with chemotherapy next week. Family has a good understanding of the care plan, immense risks, and severity of patient's illness. They are encouraged by patient's increased responsiveness today. Counseled on difficulty in balancing treatment/prevention of infection with acute and chronic illnesses.  ? ?Created space and opportunity for further discussion of code status. I again shared my worry that if Carrie [name] were to die despite all aggressive care and life-prolonging efforts, CPR would likely lead to poor outcome and cause more suffering than benefit to the patient. Patient's son continues to desire full code. Patient's sister and niece do have some concerns about causing unnecessary suffering and would like to discuss more with son as a family.  ? ?Questions and concerns addressed. PMT will continue to support holistically. ? ?Length of Stay: 19 ? ?Current Medications: ?Scheduled Meds:  ? sodium chloride   Intravenous Once  ? ARIPiprazole  5  mg Per Tube QHS  ? chlorhexidine gluconate (MEDLINE KIT)  15 mL Mouth Rinse BID  ? Chlorhexidine Gluconate Cloth  6 each Topical Q0600  ? clonazepam  1 mg Per Tube BID  ? escitalopram  20 mg Per Tube Daily  ? feeding supplement (PROSource TF)  90 mL Per Tube BID  ? folic acid  1 mg Per Tube Daily  ? insulin aspart  0-15 Units Subcutaneous Q4H  ? insulin aspart  4 Units Subcutaneous Q4H  ? insulin glargine-yfgn  20 Units Subcutaneous Daily  ? levETIRAcetam  500 mg Per Tube BID  ? mouth rinse  15 mL Mouth Rinse 10 times per day  ? metoprolol tartrate  12.5 mg Per Tube BID  ?  multivitamin with minerals  1 tablet Per Tube Daily  ? mupirocin ointment   Topical BID  ? nutrition supplement (JUVEN)  1 packet Per Tube BID BM  ? pantoprazole sodium  40 mg Per Tube Daily  ? sodium chloride flush  10-40 mL Intracatheter Q12H  ? thiamine  100 mg Per Tube Daily  ? ? ?Continuous Infusions: ? sodium chloride 250 mL (08/30/21 2121)  ? sodium chloride    ? ceFEPime (MAXIPIME) IV Stopped (09/05/21 2252)  ? dexamethasone (DECADRON) IVPB ([facility]) 20 mg (09/06/21 1021)  ? dexmedetomidine (PRECEDEX) IV infusion    ? feeding supplement (VITAL 1.5 CAL) 1,000 mL (09/05/21 1600)  ? fentaNYL infusion INTRAVENOUS 100 mcg/hr (09/06/21 0900)  ? micafungin [facility]) IV Stopped (09/05/21 2219)  ? ? ?PRN Meds: ?acetaminophen (TYLENOL) oral liquid 160 mg/5 mL **OR** acetaminophen **OR** acetaminophen, alteplase, fentaNYL, heparin lock flush, heparin lock flush, ipratropium-albuterol, lip balm, midazolam, sodium chloride flush, sodium chloride flush, sodium chloride flush ? ?Physical Exam ?Vitals and nursing note reviewed.  ?Constitutional:   ?   General: She is not in acute distress. ?   Appearance: She is ill-appearing.  ?   Interventions: She is intubated.  ?Cardiovascular:  ?   Rate and Rhythm: Normal rate.  ?Pulmonary:  ?   Effort: No respiratory distress. She is intubated.  ?Skin: ?   General: Skin is warm and dry.  ?Neurological:  ?   Mental  Status: She is unresponsive.  ?         ? ?Vital Signs: BP 112/68   Pulse 78   Temp (!) 97.1 ?F (36.2 ?C) (Axillary)   Resp 18   Ht $R'5\' 3"'Bb$  (1.6 m)   Wt 70 kg   SpO2 100%   BMI 27.34 kg/m?  ?SpO2: SpO2: 100 % ?O2 Device: O2 Device: Ventilator ?O2 Flow Rate: O2 Flow Rate (L/min): 4 L/min ? ?Intake/output summary:  ?Intake/Output Summary (Last 24 hours) at 09/06/2021 1045 ?Last data filed at 09/06/2021 1000 ?Gross per 24 hour  ?Intake 2310.62 ml  ?Output 2050 ml  ?Net 260.62 ml  ? ? ?LBM: Last BM Date : 09/05/21 ?Baseline Weight: Weight: 63.5 kg ?Most recent weight: Weight: 70 kg ? ?     ?Palliative Assessment/Data: 10% ? ? ? ? ? ?Patient Active Problem List  ? Diagnosis Date Noted  ? Candidemia ([place]) 09/01/2021  ? Goals of care, counseling/discussion   ? 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 Metabolic encephalopathy   ? Encephalitis   ? Acute encephalopathy   ? Altered mental status   ? High anion gap metabolic acidosis 64/15/8309  ? Lactic acidosis 08/09/2021  ? Disorder of adrenal gland, unspecified ([place]) 08/10/2021  ? Pancytopenia ([place]) 08/16/2021  ? Volume depletion 08/27/2021  ? Long-term corticosteroid use 08/25/2021  ? Open wound of right hand 08/14/2021  ? Adrenal nodule ([place]) 08/19/2021  ? Pressure injury of skin 09/05/2021  ? 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 Fatty liver 06/23/2021  ? Inflammatory arthritis 06/23/2021  ? Depression with anxiety 05/26/2021  ? Dyspepsia 05/26/2021  ? Hyponatremia   ? Depression   ? Confusion   ? Debility 04/08/2021  ? Rheumatoid arthritis flare ([place]) 04/08/2021  ? Fall (on) (from) other stairs and steps, subsequent encounter 04/08/2021  ? Non-traumatic rhabdomyolysis   ? Pre-syncope 03/26/2021  ? Generalized weakness 03/26/2021  ? Joint pain 03/26/2021  ? Bronchitis 01/17/2021  ? Depression, major, single episode, moderate ([place]) 08/25/2018  ? Depression, major, single episode, severe ([place]) 04/26/2017  ?  Bunion 04/22/2017  ? Pruritus 08/04/2016  ?  Environmental and seasonal allergies 08/04/2016  ? BACK PAIN 04/28/2010  ? OTHER ACUTE REACTIONS TO STRESS 03/26/2010  ? HOT FLASHES 01/17/2010  ? FATIGUE 02/02/2008  ? Hyperlipidemia 08/18/2007  ? Hypertension 02/25/2007  ? Chest pain, unspecified 02/25/2007  ? REDUCTION MAMMOPLASTY, HX OF 02/25/2007  ? ? ?Palliative Care Assessment & Plan  ? ?Patient Profile: ?61 y.o. female  with past medical history of depression with anxiety, dermatomyositis, fatty liver disease, hyperlipidemia, hypertension, nontraumatic rhabdomyolysis, presyncope, history of rheumatoid arthritis on hydroxychloroquine, mycophenolate and daily prednisone admitted on 08/10/2021 with to altered mental status, abdominal pain, constipation for several days, decreased oral intake with dehydration. Patient has presented to ED 3 times in the past 2 weeks for similar presentation, admitted 3 times in the past 6 months. ?  ?Patient has poor prognosis with complicated illness - has developed fungemia which interferes with treatment of [place]. PMT has been consulted to assist with goals of care conversation. ? ? ?Assessment: ?[place] ?Rheumatoid arthritis ?Dermatomyositis  ?Pancytopenia ?Acute respiratory failure with hypoxemia ?Acute metabolic encephalopathy ?ARF, improving ?Goals of care conversation ? ? ?Recommendations/Plan: ?Continue full code/full scope treatment ?Family will call PMT for next family meeting, as goals are clear at this time. Tentatively in a couple of weeks to allow time for [facility] treatment. ?PMT will follow peripherally. Please secure chat or call team line with urgent needs ? ? ?Prognosis: ? Guarded ? ?Discharge Planning: ?To Be Determined ? ?Care plan was discussed with RN, patient's sister Carrie [name], niece Carrie [name], and son Carrie [name]  ? ? ? ?MDM: [place] ? ? ?Dorthy Cooler, PA-C ?Palliative Medicine Team ?Team phone # [phone] ? ?Thank you for allowing the Palliative Medicine Team to assist in the care of this patient. Please utilize secure  chat with additional questions, if there is no response within 30 minutes please call the above phone number. ? ?Palliative Medicine Team providers are available by phone from 7am to 7pm daily and can be reached thr

## 2021-09-06 NOTE — Progress Notes (Signed)
eLink Physician-Brief Progress Note ?Patient Name: Carrie [name] ?DOB: [birth year] ?MRN: [id number] ? ? ?Date of Service ? 09/06/2021  ?HPI/Events of Note ? Notified of anemia with hgb of 5.9 (from 7.09) with no signs of bleeding. Platelets are still pending.   ?eICU Interventions ? Transfuse 1 unit pRBC.   ? ? ? ?Intervention Category ?Intermediate Interventions: Other: ? ?Elsie Lincoln ?09/06/2021, 5:33 AM ?

## 2021-09-06 NOTE — Progress Notes (Signed)
Date and time results received: 09/06/21 0518 ?(use smartphrase ".now" to insert current time) ? ?Test: Hgb ?Critical Value: 5.9 ? ?Name of Provider Notified: Pola Corn; MD Valora Piccolo ? ?Orders Received? Or Actions Taken?: Order to transfuse 1 unit pRBC; will await results of platelets.  ? ?Jannifer Hick, RN ?

## 2021-09-06 NOTE — Progress Notes (Signed)
? ?NAME:  Carrie [name], MRN:  [id number], DOB:  [birth year], LOS: 76 ?ADMISSION DATE:  09/02/2021, CONSULTATION DATE:  3/17 ?REFERRING MD:  Darrick Meigs, CHIEF COMPLAINT:  Confusion  ? ?History of Present Illness:  ?Patient is a 61 year old female with PMHx as below iniitally admitted for AMS, abdominal pain, constipation, decreased PO intake with dehydration. There were reports that patient experienced similar symptoms when her prednisone was tapered down. She was noted to be pancytopenic for which oncology was consulted. Patient thought to have folic acid deficiency. [id number] nl. 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 Bone marrow biopsy obtained - hypoproductive without blast or malignant cells noted.  ?Initial CT Head and MRI Brain negative for acute intracranial abnormalities and patient's encephalopathy was thought to be secondary to steroid withdrawal. However, on 3/16, neurology consulted for progressively worsening encephalopathy. Patient recommended for LP and start empiric abx coverage for meningitis.Patient also started on acyclovir for possible viral encephalitis. Recommended for MR imaging of spine with elictable clonus in RUE and BLE.  ?DIC panel obtained on 3/17 with elevated D-dimer and low fibronogen with several small bruises on arms with worsening pancytopenia - patient was given 1u pRBC and cryo.  Experienced 3-min seizure activity after attempted central access placement - required intubation for airway protection.  ?LP performed on 3/18 - CSF non-infectious. Patient transferred to [facility] for LTM EEG monitoring  ? ?Pertinent  Medical History  ?Rheumatoid arthritis ?Dermatomyositis ?Fatty liver ?Hyperlipidemia ?Hypertension ?Rhabdomyolysis ? ?Significant Hospital Events: ?Including procedures, antibiotic start and stop dates in addition to other pertinent events   ?03/2021 labs: ANA positive, Jo-1 neg, centromere neg, ds DNA  neg, RNP positive, SSA/SSB neg, RF positive, chromatin Ab neg, CCP neg ?MRI 3/13-no acute findings ?3/17 bone marrow biopsy > hypercellular bone marrow with trilineage hematopoiesis, comment: non-specific, follow up cytogenetic studies ?3/12 CT abdomen and pelvis-no acute findings, adrenal nodule, diverticulosis ?3/12 CT head without contrast-no acute findings ?3/16 EEG-moderate diffuse encephalopathy of nonspecific etiology, no seizures or epileptiform discharges noted during the recording ?3/17 intubation ?3/18 LP performed: no leukocytes, protein elevated, transfer to [facility] for LTM EEG; PICC line placed ?3/20 remains on pulse dose steroids; intermittently tolerating pressure support ?3/21 started on IVIG ?3/24 seen by Heme/Onc, 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 ?3/26 Candida fungemia for which started on micafungin. Dex and Etoposide stopped. [place] discussion with family - palliative care consulted  ?3/28 Febrile overnight for which cefepime added. WBC dropped to 1.4; started on Granix inj. ? ?Micro ?3/18 csf > neg to date ?3/18 csf fungus > neg  ?3/18 csf vzv > neg ?3/18 csf hsv > neg ?3/18 csf cryptococcal ag > neg ?3/18 csf vdrl > neg ?3/18 csf hsv > neg ?3/18  > lyme disease > negative  ?3/16 blood culture > neg to date ?3/12 blood > staph auricularis 1/4 bottles ?3/12 sars cov 2/flu > negative ?3/16 > parvoB19 IgG pos, IgM neg ?3/16 > EBV IgG pos, IgM neg ?3/16 > CMV IgG pos, IgM neg ?3/16 > HHV6 IgG pos ?3/17 > bone marrow biopsy > hypercellular with trilineage hematopoiesis ?3/18 blastomyces antigen > negative ?3/18 CSF fungal culture > negative ?3/23 blood culture 1/4 > c. [place] ?3/26 blood culture > pending ? ?Abx ?Rocephin 3/12 ?Acyclovir 3/16>3/19  ?Unasyn 3/19 > 3/24 ?Micafungin 3/24 >  ?Cefepime 3/28 >  ? ?Interim History / Subjective:  ?Restarted dexamethasone 20  mg daily. Versed titrated off.  ? ? ? ?Objective   ?Blood pressure (!) 139/95,  pulse 86, temperature 98.2 ?F (36.8 ?C), temperature source Axillary, resp. rate 19, height _0  (1.6 m), weight 70 kg, SpO2 100 %. ?   ?Vent Mode: PRVC ?FiO2 (%):  [40 %] 40 % ?Set Rate:  [16 bmp] 16 bmp ?Vt Set:  [420 mL] 420 mL ?PEEP:  [5 cmH20] 5 cmH20 ?Plateau Pressure:  [14 cmH20-16 cmH20] 14 cmH20  ? ?Intake/Output Summary (Last 24 hours) at 09/06/2021 0819 ?Last data filed at 09/06/2021 0600 ?Gross per 24 hour  ?Intake 1570.07 ml  ?Output 1750 ml  ?Net -179.93 ml  ? ?Filed Weights  ? 08/28/21 0356 08/29/21 0500 08/30/21 0500  ?Weight: 70.4 kg 69.2 kg 70 kg  ? ? ?Examination: ?General appearance: 61 y.o., female, intubated ?Eyes: PERRL, not tracking ?HENT: NCAT; MMM ?Neck: Trachea midline; no lymphadenopathy, no JVD ?Lungs: rhonchorous equal chest rise ?CV: RRR, no murmur  ?Abdomen: Soft, non-tender; non-distended, BS present  ?Extremities: anasarcic, warm ?Neuro: Grimaces to noxious stimuli from all ext ? ?Labs and imaging reviewed ? ? ?Resolved Hospital Problem list   ?Aspiration pneumonia ?Circulatory shock likely 2/2 sedation - improved off sedation ? ?Assessment & Plan:  ?# Acute metabolic encephalopathy in setting of Candida fungemia   ?# Seizure 3/26 unclear or if this is posturing related to brain injury from Gem ?Noted to have blood culture positive on 3/23, suspected to be in setting of PICC line that was removed on 3/25. Repeat blood cultures neg thus far.  ?- versed gtt discontinued 3/30 ?- decrease klonopin to 1 BID ?- ID consulted, appreciate recommendations  ?- Continue Micafungin; started cefepime on 3/28 for neutropenic fever ?- Holding etoposide at this time  ?- F/u repeat blood cultures and sensitivities ? 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 S/p 1st dose etoposide 3/24. On 3/31 discussed course with family, risks and benefits of transfer to tertiary facility for further workup/management and they decline transfer at this time. Although her encephalopathy has  persisted, she hasn't clearly had CNS involvement with CSF protein only minimally elevated for her age, no pleocytosis. ?- oncology following ?- dexamethasone 20 daily 3/31- ?- tentative plan etoposide 4/3 ? ?# Acute respiratory faiure with hypoxemia  ?With multifocal infiltrates which could be due to ARDS whether from aspiration pneumonitis, bacterial pna, fungemia, or alveolar hemorrhage.  ?- f/u tracheal aspirate ?- tentatively plan for bronch/BAL today ?- target RASS -1 ?- Full mechanical vent support ?- VAP prevention ?- Daily WUA/SBT ? ?Pancytopenia ?This is in setting of recent chemo/HLH. ?- Heme/onc consulted, appreciate recommendations  ?- Monitor for bleeding ?- Transfuse PRBC for Hgb < 7 gm/dL or platelet <20 or with active bleeding ?- Granix per heme/onc ? ?Acute renal failure - improving  ?Hypokalemia - resolved ?- Monitor BMET and UOP ?- Replace electrolytes as needed ? ?Shock liver vs HLH/MAS effect - improving ?- Trend LFTs ? ?Hyperglycemia  ?- Continue SSI ?- Continue tube feeding coverage ? ?Possible seizure ?- Continue keppra  ? ?Hx of dermatomyositis ?Rheumatoid arthritis ?Chronic steroid use - at risk for adrenal insufficiency ?S/p course of pulse steroids. High risk for adrenal insufficiency ?- IV dexamethasone as above for [place] ? ?[place]  ?- Family would like to continue with full scope of care at this time. - - Repeat family meeting Saturday ?- Palliative care consulted, appreciate assistance ? ?Best Practice (right click and "Reselect all SmartList Selections" daily)  ? ?  Diet/type: tubefeeds ?DVT prophylaxis: SCD ?GI prophylaxis: PPI ?Lines: see above ?Foley:  N/A and removal ordered  ?Code Status:  full code ?Last date of multidisciplinary goals of care discussion [family too be updated later today] ? ? ?CRITICAL CARE ?Performed by: Maryjane Hurter ? ? ?Total critical care time: 40 minutes ? ?Critical care time was exclusive of separately billable procedures and treating other  patients. ? ?Critical care was necessary to treat or prevent imminent or life-threatening deterioration. ? ?Critical care was time spent personally by me on the following activities: development of treatment plan with pa

## 2021-09-06 NOTE — Progress Notes (Signed)
?   ? ?[facility] for Infectious Disease ? ?Date of Admission:  08/11/2021    ?       ?Reason for visit: Follow up on candidemia ? ?Current antibiotics: ?Micafungin 3/24-- present ?Cefepime 3/28-- present ? ?ASSESSMENT:   ? ?61 y.o. female admitted with: ? ?Candida glabrata fungemia: Blood cultures were positive 3/23 in 1 out of 4 bottles.  Presumably from her PICC line that was subsequently removed on 3/25 (initially placed 3/18).  Previous blood cultures were negative on 3/12 and 3/16.  Her repeat blood cultures 3/26 are finalized as no growth. ?[place]: Diagnosis appears consistent with [place] based on her lab markers.  Have discussed with hematology and critical care.  Resuming dexamethasone 3/31 and planning for etoposide resumption early next week. ?Pancytopenia: In the setting of [place] and recent chemotherapy. ?Bilateral airspace opacities: Tracheal aspirate obtained 3/31 pending.  Tentative plan for BAL today. ?Encephalopathy ?RA/dermatomyositis ? ?RECOMMENDATIONS:   ? ?Continue micafungin ?Continue cefepime ?Follow-up tracheal aspirate cultures.  If BAL done today for additional cultures could also send pneumocystis smear by DFA and Aspergillus galactomannan to rule out other unlikely causes ?Agree with targeted therapy for [place] ?Will follow ? ? ?Principal Problem: ?  Candidemia ([place]) ?Active Problems: ?  Hyperlipidemia ?  Hypertension ?  Generalized weakness ?  Rheumatoid arthritis flare (HCC) ?  Depression with anxiety ?  Lactic acidosis ?  Pancytopenia ([place]) ?  Volume depletion ?  Long-term corticosteroid use ?  Open wound of right hand ?  Adrenal nodule ([place]) ?  Pressure injury of skin ?  High anion gap metabolic acidosis ?  Altered mental status ?  Metabolic encephalopathy ?  Encephalitis ?  Acute encephalopathy ?  [place] (hemophagocytic lymphohistiocytosis) ([place]) ?  Goals of care, counseling/discussion ? ? ? ?MEDICATIONS:   ? ?Scheduled Meds: ? sodium chloride   Intravenous Once  ? ARIPiprazole  5 mg Per Tube  QHS  ? chlorhexidine gluconate (MEDLINE KIT)  15 mL Mouth Rinse BID  ? Chlorhexidine Gluconate Cloth  6 each Topical Q0600  ? clonazepam  1 mg Per Tube BID  ? escitalopram  20 mg Per Tube Daily  ? feeding supplement (PROSource TF)  90 mL Per Tube BID  ? folic acid  1 mg Per Tube Daily  ? furosemide  40 mg Intravenous Once  ? insulin aspart  0-15 Units Subcutaneous Q4H  ? insulin aspart  4 Units Subcutaneous Q4H  ? insulin glargine-yfgn  20 Units Subcutaneous Daily  ? levETIRAcetam  500 mg Per Tube BID  ? mouth rinse  15 mL Mouth Rinse 10 times per day  ? metolazone  5 mg Oral Once  ? metoprolol tartrate  12.5 mg Per Tube BID  ? multivitamin with minerals  1 tablet Per Tube Daily  ? mupirocin ointment   Topical BID  ? nutrition supplement (JUVEN)  1 packet Per Tube BID BM  ? pantoprazole sodium  40 mg Per Tube Daily  ? sodium chloride flush  10-40 mL Intracatheter Q12H  ? Tbo-filgastrim (GRANIX) SQ  480 mcg Subcutaneous Once  ? thiamine  100 mg Per Tube Daily  ? ?Continuous Infusions: ? sodium chloride 250 mL (08/30/21 2121)  ? sodium chloride    ? ceFEPime (MAXIPIME) IV Stopped (09/05/21 2252)  ? dexamethasone (DECADRON) IVPB ([facility]) Stopped (09/05/21 1617)  ? dexmedetomidine (PRECEDEX) IV infusion    ? feeding supplement (VITAL 1.5 CAL) 1,000 mL (09/05/21 1600)  ? fentaNYL infusion INTRAVENOUS 100 mcg/hr (09/06/21 0600)  ?  micafungin [facility]) IV Stopped (09/05/21 2219)  ? ?PRN Meds:.acetaminophen (TYLENOL) oral liquid 160 mg/5 mL **OR** acetaminophen **OR** acetaminophen, alteplase, fentaNYL, heparin lock flush, heparin lock flush, ipratropium-albuterol, lip balm, midazolam, sodium chloride flush, sodium chloride flush, sodium chloride flush ? ?SUBJECTIVE:  ? ?24 hour events:  ?Nursing reports she is having some withdrawal to pain ?She is currently sedated with fentanyl ?Tracheal aspirate Gram stain polymicrobial.  Cultures pending ?Blood cultures finalized no growth ?CT head yesterday was negative ?Hemoglobin  today 5.9, platelets 23, ANC 200 ?LFTs stable ?Creatinine improved ?Transfer to tertiary center was discussed with family yesterday per CCM.  They currently declined this. ? ?Patient remains intubated and sedated ? ?Review of Systems  ?Unable to perform ROS: Intubated  ? ?  ?OBJECTIVE:  ? ?Blood pressure 136/81, pulse 74, temperature (!) 97.1 ?F (36.2 ?C), temperature source Axillary, resp. rate 16, height 5' 3" (1.6 m), weight 70 kg, SpO2 100 %. ?Body mass index is 27.34 kg/m?. ? ?Physical Exam ?Constitutional:   ?   Comments: Ill-appearing woman, lying in bed, intubated, sedated  ?HENT:  ?   Head: Normocephalic and atraumatic.  ?Cardiovascular:  ?   Rate and Rhythm: Normal rate and regular rhythm.  ?Pulmonary:  ?   Comments: Symmetric chest rise and fall with ventilated breath sounds. ?Abdominal:  ?   General: There is no distension.  ?   Palpations: Abdomen is soft.  ?   Tenderness: There is no abdominal tenderness.  ?Musculoskeletal:     ?   General: Swelling present.  ?   Cervical back: Normal range of motion and neck supple.  ?Skin: ?   General: Skin is warm and dry.  ?   Findings: Bruising present.  ?Neurological:  ?   Comments: Sedated with fentanyl  ? ? ? ?Lab Results: ?Lab Results  ?Component Value Date  ? WBC 0.5 (LL) 09/06/2021  ? HGB 5.9 (LL) 09/06/2021  ? HCT 18.7 (L) 09/06/2021  ? MCV 89.5 09/06/2021  ? PLT 23 (LL) 09/06/2021  ?  ?Lab Results  ?Component Value Date  ? NA 146 (H) 09/06/2021  ? K 4.9 09/06/2021  ? CO2 28 09/06/2021  ? GLUCOSE 184 (H) 09/06/2021  ? BUN 92 (H) 09/06/2021  ? CREATININE 0.94 09/06/2021  ? CALCIUM 9.3 09/06/2021  ? GFRNONAA >60 09/06/2021  ? GFRAA  08/24/2008  ?  >60        ?The eGFR has been calculated ?using the MDRD equation. ?This calculation has not been ?validated in all clinical ?situations. ?eGFR's persistently ?<60 mL/min signify ?possible Chronic Kidney Disease.  ?  ?Lab Results  ?Component Value Date  ? ALT 76 (H) 09/06/2021  ? AST 76 (H) 09/06/2021  ? ALKPHOS 333  (H) 09/06/2021  ? BILITOT 0.4 09/06/2021  ? ? ?   ?Component Value Date/Time  ? CRP 19.4 (H) 08/26/2021 1134  ? ? ?   ?Component Value Date/Time  ? ESRSEDRATE 6 08/26/2021 1134  ? ?  ?I have reviewed the micro and lab results in Epic. ? ?Imaging: ?CT HEAD WO CONTRAST (5MM) ? ?Result Date: 09/05/2021 ?CLINICAL DATA:  Altered mental status. EXAM: CT HEAD WITHOUT CONTRAST TECHNIQUE: Contiguous axial images were obtained from the base of the skull through the vertex without intravenous contrast. RADIATION DOSE REDUCTION: This exam was performed according to the departmental dose-optimization program which includes automated exposure control, adjustment of the mA and/or kV according to patient size and/or use of iterative reconstruction technique. COMPARISON:  August 17, 2021. FINDINGS:  Brain: Mild diffuse cortical atrophy is noted. No mass effect or midline shift is noted. Ventricular size is within normal limits. There is no evidence of mass lesion, hemorrhage or acute infarction. Vascular: No hyperdense vessel or unexpected calcification. Skull: Normal. Negative for fracture or focal lesion. Sinuses/Orbits: Left frontal, ethmoid and maxillary sinusitis is noted. Other: Fluid is noted in the mastoid air cells bilaterally. IMPRESSION: No acute intracranial abnormality seen. Electronically Signed   By: Marijo Conception M.D.   On: 09/05/2021 14:37    ? ?Imaging independently reviewed in Epic.  ? ? ?Mignon Pine ?[facility] for Infectious Disease ?[place] ?[phone] pager ?09/06/2021, 9:02 AM ? ? ?

## 2021-09-06 NOTE — Procedures (Signed)
Bronchoscopy Procedure Note ? ?Carrie [name]  ?[id number]  ?1961/03/21 ? ?Date:09/06/21  ?Time:2:21 PM  ? ?Provider Performing:Brennley Curtice Merlene Pulling  ? ?Procedure(s):  Flexible bronchoscopy with bronchial alveolar lavage (80998) ? ?Indication(s) ?Pneumonia ? ?Consent ?Risks of the procedure as well as the alternatives and risks of each were explained to the patient and/or caregiver.  Consent for the procedure was obtained and is signed in the bedside chart ? ?Anesthesia ?None ? ? ?Time Out ?Verified patient identification, verified procedure, site/side was marked, verified correct patient position, special equipment/implants available, medications/allergies/relevant history reviewed, required imaging and test results available. ? ? ?Sterile Technique ?Usual hand hygiene, masks, gowns, and gloves were used ? ? ?Procedure Description ?Bronchoscope advanced through endotracheal tube and into airway.  Airways were examined down to subsegmental level with findings noted below.   ?Following diagnostic evaluation, BAL performed in lingula with 100cc saline instilled and 25cc blood tinged cloudy fluid returned.  ? ?Findings:  ?- edematous airways throughout with thin secretions otherwise unremarkable airway exam ? ? ?Complications/Tolerance ?None; patient tolerated the procedure well. ?Chest X-ray is not needed post procedure. ? ? ?EBL ?Minimal ? ? ?Specimen(s) ?BAL sent for micro, cyto  ?

## 2021-09-06 DEATH — deceased

## 2021-09-07 DIAGNOSIS — B377 Candidal sepsis: Secondary | ICD-10-CM | POA: Diagnosis not present

## 2021-09-07 LAB — CBC WITH DIFFERENTIAL/PLATELET
Abs Immature Granulocytes: 0.1 10*3/uL — ABNORMAL HIGH (ref 0.00–0.07)
Band Neutrophils: 9 %
Basophils Absolute: 0 10*3/uL (ref 0.0–0.1)
Basophils Relative: 3 %
Eosinophils Absolute: 0 10*3/uL (ref 0.0–0.5)
Eosinophils Relative: 1 %
HCT: 25.8 % — ABNORMAL LOW (ref 36.0–46.0)
Hemoglobin: 8.3 g/dL — ABNORMAL LOW (ref 12.0–15.0)
Lymphocytes Relative: 67 %
Lymphs Abs: 1 10*3/uL (ref 0.7–4.0)
MCH: 28.1 pg (ref 26.0–34.0)
MCHC: 32.2 g/dL (ref 30.0–36.0)
MCV: 87.5 fL (ref 80.0–100.0)
Metamyelocytes Relative: 2 %
Monocytes Absolute: 0.1 10*3/uL (ref 0.1–1.0)
Monocytes Relative: 4 %
Myelocytes: 2 %
Neutro Abs: 0.3 10*3/uL — CL (ref 1.7–7.7)
Neutrophils Relative %: 11 %
Platelets: 33 10*3/uL — ABNORMAL LOW (ref 150–400)
Promyelocytes Relative: 1 %
RBC: 2.95 MIL/uL — ABNORMAL LOW (ref 3.87–5.11)
RDW: 20 % — ABNORMAL HIGH (ref 11.5–15.5)
Smear Review: DECREASED
WBC: 1.5 10*3/uL — ABNORMAL LOW (ref 4.0–10.5)
nRBC: 116 /100 WBC — ABNORMAL HIGH
nRBC: 46.6 % — ABNORMAL HIGH (ref 0.0–0.2)

## 2021-09-07 LAB — GLUCOSE, CAPILLARY
Glucose-Capillary: 182 mg/dL — ABNORMAL HIGH (ref 70–99)
Glucose-Capillary: 218 mg/dL — ABNORMAL HIGH (ref 70–99)
Glucose-Capillary: 288 mg/dL — ABNORMAL HIGH (ref 70–99)
Glucose-Capillary: 301 mg/dL — ABNORMAL HIGH (ref 70–99)
Glucose-Capillary: 285 mg/dL — ABNORMAL HIGH (ref 70–99)
Glucose-Capillary: 220 mg/dL — ABNORMAL HIGH (ref 70–99)

## 2021-09-07 LAB — TYPE AND SCREEN
ABO/RH(D): A NEG
Antibody Screen: NEGATIVE
Unit division: 0

## 2021-09-07 LAB — COMPREHENSIVE METABOLIC PANEL
ALT: 81 U/L — ABNORMAL HIGH (ref 0–44)
AST: 75 U/L — ABNORMAL HIGH (ref 15–41)
Albumin: 1.6 g/dL — ABNORMAL LOW (ref 3.5–5.0)
Alkaline Phosphatase: 327 U/L — ABNORMAL HIGH (ref 38–126)
Anion gap: 7 (ref 5–15)
BUN: 97 mg/dL — ABNORMAL HIGH (ref 8–23)
CO2: 28 mmol/L (ref 22–32)
Calcium: 9.8 mg/dL (ref 8.9–10.3)
Chloride: 112 mmol/L — ABNORMAL HIGH (ref 98–111)
Creatinine, Ser: 0.97 mg/dL (ref 0.44–1.00)
GFR, Estimated: >60 mL/min (ref >60)
Glucose, Bld: 204 mg/dL — ABNORMAL HIGH (ref 70–99)
Potassium: 4.3 mmol/L (ref 3.5–5.1)
Sodium: 147 mmol/L — ABNORMAL HIGH (ref 135–145)
Total Bilirubin: 0.5 mg/dL (ref 0.3–1.2)
Total Protein: 5.6 g/dL — ABNORMAL LOW (ref 6.5–8.1)

## 2021-09-07 LAB — POCT I-STAT 7, (LYTES, BLD GAS, ICA,H+H)
Acid-Base Excess: 5 mmol/L — ABNORMAL HIGH (ref 0.0–2.0)
Bicarbonate: 28.9 mmol/L — ABNORMAL HIGH (ref 20.0–28.0)
Calcium, Ion: 1.42 mmol/L — ABNORMAL HIGH (ref 1.15–1.40)
HCT: 26 % — ABNORMAL LOW (ref 36.0–46.0)
Hemoglobin: 8.8 g/dL — ABNORMAL LOW (ref 12.0–15.0)
O2 Saturation: 93 %
Patient temperature: 99.5
Potassium: 4.3 mmol/L (ref 3.5–5.1)
Sodium: 147 mmol/L — ABNORMAL HIGH (ref 135–145)
TCO2: 30 mmol/L (ref 22–32)
pCO2 arterial: 40.5 mmHg (ref 32–48)
pH, Arterial: 7.463 — ABNORMAL HIGH (ref 7.35–7.45)
pO2, Arterial: 66 mmHg — ABNORMAL LOW (ref 83–108)

## 2021-09-07 LAB — BPAM RBC
Blood Product Expiration Date: 202304142359
ISSUE DATE / TIME: 202304010813
Unit Type and Rh: 600

## 2021-09-07 LAB — CULTURE, RESPIRATORY W GRAM STAIN
Culture: NORMAL
Gram Stain: NONE SEEN

## 2021-09-07 LAB — PATHOLOGIST SMEAR REVIEW

## 2021-09-07 NOTE — Progress Notes (Addendum)
? ?NAME:  ARRIAH WADLE, MRN:  [id number], DOB:  12-Jun-1960, LOS: 49 ?ADMISSION DATE:  08/16/2021, CONSULTATION DATE:  3/17 ?REFERRING MD:  Darrick Meigs, CHIEF COMPLAINT:  Confusion  ? ?History of Present Illness:  ?Patient is a 61 year old female with PMHx as below iniitally admitted for AMS, abdominal pain, constipation, decreased PO intake with dehydration. There were reports that patient experienced similar symptoms when her prednisone was tapered down. She was noted to be pancytopenic for which oncology was consulted. Patient thought to have folic acid deficiency. ADAMTS13 nl. 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 Bone marrow biopsy obtained - hypoproductive without blast or malignant cells noted.  ?Initial CT Head and MRI Brain negative for acute intracranial abnormalities and patient's encephalopathy was thought to be secondary to steroid withdrawal. However, on 3/16, neurology consulted for progressively worsening encephalopathy. Patient recommended for LP and start empiric abx coverage for meningitis.Patient also started on acyclovir for possible viral encephalitis. Recommended for MR imaging of spine with elictable clonus in RUE and BLE.  ?DIC panel obtained on 3/17 with elevated D-dimer and low fibronogen with several small bruises on arms with worsening pancytopenia - patient was given 1u pRBC and cryo.  Experienced 3-min seizure activity after attempted central access placement - required intubation for airway protection.  ?LP performed on 3/18 - CSF non-infectious. Patient transferred to [facility] for LTM EEG monitoring  ? ?Pertinent  Medical History  ?Rheumatoid arthritis ?Dermatomyositis ?Fatty liver ?Hyperlipidemia ?Hypertension ?Rhabdomyolysis ? ?Significant Hospital Events: ?Including procedures, antibiotic start and stop dates in addition to other pertinent events   ?03/2021 labs: ANA positive, Jo-1 neg, centromere neg, ds DNA  neg, RNP positive, SSA/SSB neg, RF positive, chromatin Ab neg, CCP neg ?MRI 3/13-no acute findings ?3/17 bone marrow biopsy > hypercellular bone marrow with trilineage hematopoiesis, comment: non-specific, follow up cytogenetic studies ?3/12 CT abdomen and pelvis-no acute findings, adrenal nodule, diverticulosis ?3/12 CT head without contrast-no acute findings ?3/16 EEG-moderate diffuse encephalopathy of nonspecific etiology, no seizures or epileptiform discharges noted during the recording ?3/17 intubation ?3/18 LP performed: no leukocytes, protein elevated, transfer to [facility] for LTM EEG; PICC line placed ?3/20 remains on pulse dose steroids; intermittently tolerating pressure support ?3/21 started on IVIG ?3/24 seen by Heme/Onc, 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 ?3/26 Candida fungemia for which started on micafungin. Dex and Etoposide stopped. [place] discussion with family - palliative care consulted  ?3/28 Febrile overnight for which cefepime added. WBC dropped to 1.4; started on Granix inj. ?4/1 Bronch/BAL ? ?Micro ?3/18 csf > neg to date ?3/18 csf fungus > neg  ?3/18 csf vzv > neg ?3/18 csf hsv > neg ?3/18 csf cryptococcal ag > neg ?3/18 csf vdrl > neg ?3/18 csf hsv > neg ?3/18  > lyme disease > negative  ?3/16 blood culture > neg to date ?3/12 blood > staph auricularis 1/4 bottles ?3/12 sars cov 2/flu > negative ?3/16 > parvoB19 IgG pos, IgM neg ?3/16 > EBV IgG pos, IgM neg ?3/16 > CMV IgG pos, IgM neg ?3/16 > HHV6 IgG pos ?3/17 > bone marrow biopsy > hypercellular with trilineage hematopoiesis ?3/18 blastomyces antigen > negative ?3/18 CSF fungal culture > negative ?3/23 blood culture 1/4 > c. Glabrata ?3/26 blood culture > pending ? ?Abx ?Rocephin 3/12 ?Acyclovir 3/16>3/19  ?Unasyn 3/19 > 3/24 ?Micafungin 3/24 >  ?Cefepime 3/28 >  ? ?Interim History / Subjective:  ? ?  No acute issues.  ? ?Objective   ?Blood pressure (!) 159/92, pulse (!) 115, temperature  99.5 ?F (37.5 ?C), temperature source Oral, resp. rate (!) 26, height _0  (1.6 m), weight 70 kg, SpO2 100 %. ?   ?Vent Mode: PRVC ?FiO2 (%):  [40 %] 40 % ?Set Rate:  [16 bmp] 16 bmp ?Vt Set:  [420 mL] 420 mL ?PEEP:  [5 cmH20] 5 cmH20 ?Plateau Pressure:  [17 cmH20-19 cmH20] 17 cmH20  ? ?Intake/Output Summary (Last 24 hours) at 09/07/2021 0755 ?Last data filed at 09/07/2021 0600 ?Gross per 24 hour  ?Intake 2937.52 ml  ?Output 3050 ml  ?Net -112.48 ml  ? ?Filed Weights  ? 08/28/21 0356 08/29/21 0500 08/30/21 0500  ?Weight: 70.4 kg 69.2 kg 70 kg  ? ? ?Examination: ?General appearance: 61 y.o., female, intubated ?Eyes: PERRL, not tracking ?HENT: NCAT; MMM ?Neck: Trachea midline; no lymphadenopathy, no JVD ?Lungs: rhonchorous R>L equal chest rise ?CV: tachycardic, RR, no murmur  ?Abdomen: Soft, non-tender; non-distended, BS present  ?Extremities: anasarcic, warm ?Neuro: Grimaces to noxious stimuli from all ext ? ?Labs and imaging reviewed ? ? ?Resolved Hospital Problem list   ?Aspiration pneumonia ?Circulatory shock likely 2/2 sedation - improved off sedation ? ?Assessment & Plan:  ?# Acute metabolic encephalopathy in setting of Candida fungemia   ?# Seizure 3/26 unclear or if this is posturing related to brain injury from [place] ?Noted to have blood culture positive on 3/23, suspected to be in setting of PICC line that was removed on 3/25. Repeat blood cultures neg thus far.  ?- klonopin 1 BID ?- ID consulted, appreciate recommendations  ?- Continue Micafungin; started cefepime on 3/28 for neutropenic fever ?- Holding etoposide at this time  ? 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 S/p 1st dose etoposide 3/24. On 3/31 discussed course with family, risks and benefits of transfer to tertiary facility for further workup/management and they decline transfer at this time. Although her encephalopathy has persisted, she hasn't clearly had CNS involvement with CSF protein only minimally elevated for her  age, no pleocytosis. ?- oncology following ?- dexamethasone 20 daily 3/31- ?- tentative plan etoposide 4/3 ? ?# Acute respiratory faiure with hypoxemia  ?With multifocal infiltrates which could be due to ARDS whether from aspiration pneumonitis, bacterial pna, fungemia, or alveolar hemorrhage.  ?- f/u tracheal aspirate, BAL micro ?- target RASS -1 ?- Full mechanical vent support ?- VAP prevention ?- Daily WUA/SBT ? ?Pancytopenia ?This is in setting of recent chemo/HLH. ?- Heme/onc consulted, appreciate recommendations  ?- Monitor for bleeding ?- Transfuse PRBC for Hgb < 7 gm/dL or platelet <20 or with active bleeding ?- Granix per heme/onc ? ?Acute renal failure - improving  ?Hypokalemia - resolved ?- Monitor BMET and UOP ?- Replace electrolytes as needed ? ?Shock liver vs HLH/MAS effect - improving ?- Trend LFTs ? ?Hyperglycemia  ?- Continue SSI ?- Continue tube feeding coverage ? ?Possible seizure ?- Continue keppra  ? ?Hx of dermatomyositis ?Rheumatoid arthritis ?Chronic steroid use - at risk for adrenal insufficiency ?S/p course of pulse steroids. High risk for adrenal insufficiency ?- IV dexamethasone as above for [place] ? ?[place]  ?- Family would like to continue with full scope of care at this time ?- Palliative care consulted, appreciate assistance ? ?Best Practice (right click and "Reselect all SmartList Selections" daily)  ? ?Diet/type: tubefeeds ?DVT prophylaxis: SCD ?GI prophylaxis: PPI ?Lines: see above ?Foley:  N/A and removal ordered  ?Code Status:  full code ?  Last date of multidisciplinary goals of care discussion [family too be updated later today] ? ? ?CRITICAL CARE ?Performed by: Maryjane Hurter ? ? ?Total critical care time: 35 minutes ? ?Critical care time was exclusive of separately billable procedures and treating other patients. ? ?Critical care was necessary to treat or prevent imminent or life-threatening deterioration. ? ?Critical care was time spent personally by me on the following  activities: development of treatment plan with patient and/or surrogate as well as nursing, discussions with consultants, evaluation of patient's response to treatment, examination of patient, obtaining history f

## 2021-09-08 DIAGNOSIS — D761 Hemophagocytic lymphohistiocytosis: Secondary | ICD-10-CM | POA: Diagnosis not present

## 2021-09-08 DIAGNOSIS — L89304 Pressure ulcer of unspecified buttock, stage 4: Secondary | ICD-10-CM

## 2021-09-08 DIAGNOSIS — G9340 Encephalopathy, unspecified: Secondary | ICD-10-CM | POA: Diagnosis not present

## 2021-09-08 DIAGNOSIS — B377 Candidal sepsis: Secondary | ICD-10-CM | POA: Diagnosis not present

## 2021-09-08 DIAGNOSIS — J9601 Acute respiratory failure with hypoxia: Secondary | ICD-10-CM | POA: Diagnosis not present

## 2021-09-08 DIAGNOSIS — D701 Agranulocytosis secondary to cancer chemotherapy: Secondary | ICD-10-CM | POA: Diagnosis not present

## 2021-09-08 DIAGNOSIS — D61818 Other pancytopenia: Secondary | ICD-10-CM | POA: Diagnosis not present

## 2021-09-08 LAB — CBC WITH DIFFERENTIAL/PLATELET
Abs Immature Granulocytes: 0.1 10*3/uL — ABNORMAL HIGH (ref 0.00–0.07)
Band Neutrophils: 8 %
Basophils Absolute: 0 10*3/uL (ref 0.0–0.1)
Basophils Relative: 0 %
Eosinophils Absolute: 0 10*3/uL (ref 0.0–0.5)
Eosinophils Relative: 0 %
HCT: 25.9 % — ABNORMAL LOW (ref 36.0–46.0)
Hemoglobin: 8.2 g/dL — ABNORMAL LOW (ref 12.0–15.0)
Lymphocytes Relative: 38 %
Lymphs Abs: 1.1 10*3/uL (ref 0.7–4.0)
MCH: 27.9 pg (ref 26.0–34.0)
MCHC: 31.7 g/dL (ref 30.0–36.0)
MCV: 88.1 fL (ref 80.0–100.0)
Metamyelocytes Relative: 1 %
Monocytes Absolute: 0.2 10*3/uL (ref 0.1–1.0)
Monocytes Relative: 8 %
Myelocytes: 2 %
Neutro Abs: 1.4 10*3/uL — ABNORMAL LOW (ref 1.7–7.7)
Neutrophils Relative %: 41 %
Platelets: 34 10*3/uL — ABNORMAL LOW (ref 150–400)
Promyelocytes Relative: 2 %
RBC: 2.94 MIL/uL — ABNORMAL LOW (ref 3.87–5.11)
RDW: 20 % — ABNORMAL HIGH (ref 11.5–15.5)
WBC: 2.9 10*3/uL — ABNORMAL LOW (ref 4.0–10.5)
nRBC: 43.7 % — ABNORMAL HIGH (ref 0.0–0.2)

## 2021-09-08 LAB — CULTURE, RESPIRATORY W GRAM STAIN: Culture: NORMAL

## 2021-09-08 LAB — COMPREHENSIVE METABOLIC PANEL
ALT: 77 U/L — ABNORMAL HIGH (ref 0–44)
AST: 62 U/L — ABNORMAL HIGH (ref 15–41)
Albumin: 1.7 g/dL — ABNORMAL LOW (ref 3.5–5.0)
Alkaline Phosphatase: 301 U/L — ABNORMAL HIGH (ref 38–126)
Anion gap: 8 (ref 5–15)
BUN: 99 mg/dL — ABNORMAL HIGH (ref 8–23)
CO2: 26 mmol/L (ref 22–32)
Calcium: 9.9 mg/dL (ref 8.9–10.3)
Chloride: 114 mmol/L — ABNORMAL HIGH (ref 98–111)
Creatinine, Ser: 0.98 mg/dL (ref 0.44–1.00)
GFR, Estimated: >60 mL/min (ref >60)
Glucose, Bld: 213 mg/dL — ABNORMAL HIGH (ref 70–99)
Potassium: 4.5 mmol/L (ref 3.5–5.1)
Sodium: 148 mmol/L — ABNORMAL HIGH (ref 135–145)
Total Bilirubin: 0.5 mg/dL (ref 0.3–1.2)
Total Protein: 5.8 g/dL — ABNORMAL LOW (ref 6.5–8.1)

## 2021-09-08 LAB — GLUCOSE, CAPILLARY
Glucose-Capillary: 145 mg/dL — ABNORMAL HIGH (ref 70–99)
Glucose-Capillary: 121 mg/dL — ABNORMAL HIGH (ref 70–99)
Glucose-Capillary: 211 mg/dL — ABNORMAL HIGH (ref 70–99)
Glucose-Capillary: 169 mg/dL — ABNORMAL HIGH (ref 70–99)
Glucose-Capillary: 158 mg/dL — ABNORMAL HIGH (ref 70–99)
Glucose-Capillary: 172 mg/dL — ABNORMAL HIGH (ref 70–99)

## 2021-09-08 MED ORDER — SODIUM CHLORIDE 0.9 % IV SOLN
150.0000 mg/m2 | Freq: Once | INTRAVENOUS | Status: AC
  Administered 2021-09-08: 260 mg via INTRAVENOUS
  Filled 2021-09-08 (×2): qty 13

## 2021-09-08 MED ORDER — TBO-FILGRASTIM 480 MCG/0.8ML ~~LOC~~ SOSY
480.0000 ug | PREFILLED_SYRINGE | Freq: Every day | SUBCUTANEOUS | Status: AC
  Administered 2021-09-09 – 2021-09-10 (×2): 480 ug via SUBCUTANEOUS
  Filled 2021-09-08 (×2): qty 0.8

## 2021-09-08 MED ORDER — FUROSEMIDE 10 MG/ML IJ SOLN
40.0000 mg | Freq: Three times a day (TID) | INTRAMUSCULAR | Status: AC
Start: 2021-09-08 — End: 2021-09-08
  Administered 2021-09-08 (×2): 40 mg via INTRAVENOUS
  Filled 2021-09-08 (×2): qty 4

## 2021-09-08 NOTE — Progress Notes (Signed)
Inpatient Diabetes Program Recommendations ? ?AACE/ADA: New Consensus Statement on Inpatient Glycemic Control (2015) ? ?Target Ranges:  Prepandial:   less than 140 mg/dL ?     Peak postprandial:   less than 180 mg/dL (1-2 hours) ?     Critically ill patients:  140 - 180 mg/dL  ? ?Lab Results  ?Component Value Date  ? GLUCAP 211 (H) 09/08/2021  ? HGBA1C 6.2 (H) 08/28/2021  ? ? ?Review of Glycemic Control ? Latest Reference Range & Units 09/07/21 08:01 09/07/21 11:25 09/07/21 16:01 09/07/21 19:13 09/07/21 23:07 09/08/21 03:19 09/08/21 07:32 09/08/21 11:39  ?Glucose-Capillary 70 - 99 mg/dL 161218 (H) 096288 (H) 045301 (H) 285 (H) 220 (H) 145 (H) 121 (H) 211 (H)  ? ?Current orders for Inpatient glycemic control:  ?Semglee 20 units QD ?Novolog 0-15 units Q4H ?Novolog 4 units Q4H TFC ? ?Decadron 20 mg QD ?Vital 1.5 @ 55 mg/dL ? ?Inpatient Diabetes Program Recommendations:   ? ?-  May consider increasing Tube Feed Coverage to Novolog 6 units tid. ? ?Thank you, ?Christena DeemShannon Hadlee Burback RN, MSN, BC-ADM ?Inpatient Diabetes Coordinator ?Team Pager [phone] ([phone]) ? ? ? ? ?

## 2021-09-08 NOTE — Progress Notes (Signed)
?   ? ?[facility] for Infectious Disease ? ?Date of Admission:  08/08/2021    ?       ?Reason for visit: Follow up on candidemia ? ?Current antibiotics: ?Micafungin 3/24-- present ?Cefepime 3/28-- present ? ?ASSESSMENT:   ? ?61 y.o. female admitted with: ? ?Candida glabrata fungemia: Blood cultures +3/23 from 1 out of 4 bottles.  This was presumably from her PICC line that was removed on 3/25 and had initially been placed on 3/18.  Prior blood cultures negative on 3/12 and 3/16.  Repeat blood cultures finalized as no growth from 3/26. ?[place]: Diagnosis consistent with [place] based on lab markers.  Hematology resumed higher dose steroids over the weekend and restarted etoposide today. ?Pancytopenia: In the setting of [place] and recent chemotherapy.  This appears stable today. ?Bilateral airspace opacities: Tracheal aspirate obtained 3/31 negative.  BAL studies done 4/1 are in process. ?Stage IV sacral ulcer: Evaluated by wound care today and does not appear grossly infected although does track to palpable bone. ?Rheumatoid arthritis/dermatomyositis diagnosis ? ?RECOMMENDATIONS:   ? ?Continue micafungin for 2 weeks from negative blood cultures.  End date 09/14/2021 ?Continue cefepime.  Today is day #7 ?Follow-up tracheal aspirate cultures and pending BAL studies ?[place] management per hematology/oncology ?Wound care, glycemic control ?Will follow ? ? ?Principal Problem: ?  Candidemia ([place]) ?Active Problems: ?  Hyperlipidemia ?  Hypertension ?  Generalized weakness ?  Rheumatoid arthritis flare (HCC) ?  Depression with anxiety ?  Lactic acidosis ?  Pancytopenia ([place]) ?  Volume depletion ?  Long-term corticosteroid use ?  Open wound of right hand ?  Adrenal nodule ([place]) ?  Pressure injury of skin ?  High anion gap metabolic acidosis ?  Altered mental status ?  Metabolic encephalopathy ?  Encephalitis ?  Acute encephalopathy ?  [place] (hemophagocytic lymphohistiocytosis) ([place]) ?  Goals of care,  counseling/discussion ? ? ? ?MEDICATIONS:   ? ?Scheduled Meds: ? ARIPiprazole  5 mg Per Tube QHS  ? chlorhexidine gluconate (MEDLINE KIT)  15 mL Mouth Rinse BID  ? Chlorhexidine Gluconate Cloth  6 each Topical Q0600  ? clonazepam  1 mg Per Tube BID  ? escitalopram  20 mg Per Tube Daily  ? etoposide  150 mg/m2 (Treatment Plan Recorded) Intravenous Once  ? feeding supplement (PROSource TF)  90 mL Per Tube BID  ? folic acid  1 mg Per Tube Daily  ? furosemide  40 mg Intravenous Q8H  ? insulin aspart  0-15 Units Subcutaneous Q4H  ? insulin aspart  4 Units Subcutaneous Q4H  ? insulin glargine-yfgn  20 Units Subcutaneous Daily  ? levETIRAcetam  500 mg Per Tube BID  ? mouth rinse  15 mL Mouth Rinse 10 times per day  ? metoprolol tartrate  12.5 mg Per Tube BID  ? multivitamin with minerals  1 tablet Per Tube Daily  ? mupirocin ointment   Topical BID  ? nutrition supplement (JUVEN)  1 packet Per Tube BID BM  ? pantoprazole sodium  40 mg Per Tube Daily  ? sodium chloride flush  10-40 mL Intracatheter Q12H  ? thiamine  100 mg Per Tube Daily  ? ?Continuous Infusions: ? sodium chloride 250 mL (08/30/21 2121)  ? sodium chloride    ? ceFEPime (MAXIPIME) IV Stopped (09/07/21 2322)  ? dexamethasone (DECADRON) IVPB ([facility]) 20 mg (09/08/21 0815)  ? dexmedetomidine (PRECEDEX) IV infusion    ? feeding supplement (VITAL 1.5 CAL) 1,000 mL (09/07/21 1500)  ? fentaNYL infusion INTRAVENOUS  200 mcg/hr (09/08/21 0800)  ? micafungin [facility]) IV Stopped (09/07/21 2238)  ? ?PRN Meds:.acetaminophen (TYLENOL) oral liquid 160 mg/5 mL **OR** acetaminophen **OR** acetaminophen, alteplase, fentaNYL, heparin lock flush, heparin lock flush, ipratropium-albuterol, lip balm, midazolam, sodium chloride flush, sodium chloride flush, sodium chloride flush ? ?SUBJECTIVE:  ? ?24 hour events:  ?No acute events appear noted overnight ?Low-grade fever of 100.7 ?Etoposide resumed ?Continues on dexamethasone 20 mg daily ?No updated cultures ?No new imaging ?ANC  today 1.4, platelets 34 ?LFTs stable/improved ? ?Remains intubated and sedated ? ?Review of Systems  ?Unable to perform ROS: Intubated  ? ?  ?OBJECTIVE:  ? ?Blood pressure 137/77, pulse 83, temperature 99.9 ?F (37.7 ?C), temperature source Axillary, resp. rate 18, height _0  (1.6 m), weight 70 kg, SpO2 100 %. ?Body mass index is 27.34 kg/m?. ? ?Physical Exam ?Constitutional:   ?   Comments: Chronically ill-appearing woman, lying in bed, not following commands, not tracking with her eyes  ?HENT:  ?   Head: Normocephalic and atraumatic.  ?Cardiovascular:  ?   Rate and Rhythm: Normal rate and regular rhythm.  ?Pulmonary:  ?   Effort: Pulmonary effort is normal. No respiratory distress.  ?Abdominal:  ?   General: There is no distension.  ?   Palpations: Abdomen is soft.  ?   Tenderness: There is no abdominal tenderness.  ?Musculoskeletal:     ?   General: Swelling present.  ?   Cervical back: Normal range of motion and neck supple.  ?Skin: ?   General: Skin is warm and dry.  ?   Findings: Bruising present. No rash.  ?Neurological:  ?   Mental Status: Mental status is at baseline.  ? ? ? ?Lab Results: ?Lab Results  ?Component Value Date  ? WBC 2.9 (L) 09/08/2021  ? HGB 8.2 (L) 09/08/2021  ? HCT 25.9 (L) 09/08/2021  ? MCV 88.1 09/08/2021  ? PLT 34 (L) 09/08/2021  ?  ?Lab Results  ?Component Value Date  ? NA 148 (H) 09/08/2021  ? K 4.5 09/08/2021  ? CO2 26 09/08/2021  ? GLUCOSE 213 (H) 09/08/2021  ? BUN 99 (H) 09/08/2021  ? CREATININE 0.98 09/08/2021  ? CALCIUM 9.9 09/08/2021  ? GFRNONAA >60 09/08/2021  ? GFRAA  08/24/2008  ?  >60        ?The eGFR has been calculated ?using the MDRD equation. ?This calculation has not been ?validated in all clinical ?situations. ?eGFR's persistently ?<60 mL/min signify ?possible Chronic Kidney Disease.  ?  ?Lab Results  ?Component Value Date  ? ALT 77 (H) 09/08/2021  ? AST 62 (H) 09/08/2021  ? ALKPHOS 301 (H) 09/08/2021  ? BILITOT 0.5 09/08/2021  ? ? ?   ?Component Value Date/Time  ? CRP  19.4 (H) 08/26/2021 1134  ? ? ?   ?Component Value Date/Time  ? ESRSEDRATE 6 08/26/2021 1134  ? ?  ?I have reviewed the micro and lab results in Epic. ? ?Imaging: ?No results found.  ? ?Imaging independently reviewed in Epic.  ? ? ?Mignon Pine ?[facility] for Infectious Disease ?[place] ?[phone] pager ?09/08/2021, 10:29 AM ? ? ? ?

## 2021-09-08 NOTE — Progress Notes (Signed)
? ?NAME:  Carrie [name], MRN:  [id number], DOB:  1960/10/10, LOS: 21 ?ADMISSION DATE:  08/12/2021, CONSULTATION DATE:  3/17 ?REFERRING MD:  Darrick Meigs, CHIEF COMPLAINT:  Confusion  ? 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  Found to have pancytopenia and seen by hematology, and initially felt to be related to mycophenolate and steroid therapy.  Bone marrow biopsy showed hypercellular marrow w/o blasts or malignant cells.  She developed worsening encephalopathy on 3/16 and neuro consulted.  She was started on therapy for meningitis and viral encephalitis, and had LP done.  She had new onset seizure and required intubation for airway protection.  She was [facility] from [facility] to [facility] for LTM. Later felt to have [place] and started on dexamethasone and etoposide.  Course complicated by Candidal fungemia likely from PICC line. ? ?Pertinent  Medical History  ?Rheumatoid arthritis, Dermatomyositis, Fatty liver, HLD, HTN, Depression, Anxiety ? 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 ?3/26 Candida fungemia for which started on micafungin. Dex and Etoposide stopped. [place] discussion with family - palliative care consulted  ?3/28 Febrile overnight for which cefepime added. WBC dropped to 1.4; started on Granix inj. ?4/1 Bronch/BAL ? ?Studies:  ?03/2021 labs >> ANA positive, Jo-1 neg, centromere neg, ds DNA neg, RNP positive, SSA/SSB neg, RF positive, chromatin Ab neg, CCP  neg ?MRI 3/13 >> no acute findings ?3/17 bone marrow biopsy >> hypercellular bone marrow with trilineage hematopoiesis, comment: non-specific, follow up cytogenetic studies ?3/12 CT abdomen and pelvis >> no acute findings, adrenal nodule, diverticulosis ?3/12 CT head without contrast >> no acute findings ?3/16 EEG >> moderate diffuse encephalopathy of nonspecific etiology, no seizures or epileptiform discharges noted during the recording ?3/19 Echo >> EF 60 to 65%, mild LVH, grade 1 DD, mild/mod TR ?3/29 CT abd >> b/l ASD, small GB stone, diffuse subcutaneous edema ?3/31 CT head >> mild cortical atrophy ? ?Micro:  ?3/16 parvoB19 IgG pos, IgM neg ?3/16 EBV IgG pos, IgM neg ?3/16 CMV IgG pos, IgM neg ?3/16 HHV6 IgG pos ?3/17 bone marrow biopsy > hypercellular with trilineage hematopoiesis ?3/23 blood culture 1/4 > Candida glabrata ?4/01 BAL > ? ?Antibiotics:  ?Rocephin 3/12 ?Acyclovir 3/16>3/19  ?Unasyn 3/19 > 3/24 ?Micafungin 3/24 >  ?Cefepime 3/28 >  ? ?Interim History / Subjective:  ?Remains on sedation, vent support. ? ?Objective   ?Blood pressure 131/82, pulse (!) 101, temperature (!) 100.4 ?F (38 ?C), temperature source Axillary, resp. rate (!) 28, height _0  (1.6 m), weight 70 kg, SpO2 100 %. ?   ?Vent Mode: PRVC ?FiO2 (%):  [40 %] 40 % ?Set Rate:  [16 bmp] 16 bmp ?Vt Set:  [420 mL] 420 mL ?PEEP:  [5 cmH20] 5 cmH20 ?Plateau Pressure:  [13 cmH20-17 cmH20] 17 cmH20  ? ?Intake/Output Summary (Last 24 hours) at 09/08/2021 0809 ?Last data filed at 09/08/2021 0600 ?Gross per 24 hour  ?Intake 2065.12 ml  ?Output 1750 ml  ?Net 315.12 ml  ? ?Filed Weights  ? 08/28/21 0356 08/29/21 0500 08/30/21 0500  ?Weight: 70.4 kg 69.2  kg 70 kg  ? ? ?Examination: ? ?General - sedated ?Eyes - pupils reactive ?ENT - ETT ?Cardiac - regular rate/rhythm, no murmur ?Chest - b/l crackles ?Abdomen - soft, non tender, + bowel sounds ?Extremities - 3+ edema ?Skin - no rashes ?Neuro - RASS -2 ? ?Resolved Hospital Problem list   ?Aspiration  pneumonia, Septic shock, Shock liver, AKI from ATN in setting of sepsis ? ?Assessment & Plan:  ? ?Candidal fungemia in setting of chronic immunosuppression. ?HCAP. ?- continue micafungin, cefepime per ID ? ?Acute metabolic encephalopathy, cause uncertain. ?New onset seizure. ?Hx of anxiety and depression. ?- concern for autoimmune process versus [place] with CNS involvement ?- continue abilify, klonopin, lexapro ?- continue keppra ? ?[place]. ?- primary versus MAS in setting of connective tissue disease ?- based on lab markers (increased IL-2 receptor) ?- dexamethasone resumed 3/31 ?- tentative plan to resume etoposide on 4/03 per hematology/oncology ? ?Pancytopenia. ?- from chemo and [place] ?- f/u CBC ?- transfuse for Hb < 7, PLT < 20K ? ?Acute hypoxic respiratory failure from HCAP. ?- pressure support wean as tolerated ?- not a candidate for extubation trial at this time ?- might need trach if family wishes to continue aggressive therapy ?- f/u CXR intermittently ? ?Steroid induced hyperglycemia. ?- SSI with tube feed coverage and semglee ? ?Hx of dermatomyositis and rheumatoid arthritis. ?- continue dexamethasone ? ?Hx of HTN. ?Hypervolemia. ?- continue lopressor ?- lasix 40 mg IV q8h x 2 on 4/03 ? ?Pressure injury. ?- stage 3, Rt hand, POA ?- stage 3, sacrum, POA ?- stage 2, Lt buttock, no present on admission ?- wound care ? ?Goals of care> ?- Family would like to continue with full scope of care at this time ?- Palliative care consulted, appreciate assistance ? ?Best Practice (right click and "Reselect all SmartList Selections" daily)  ? ?Diet/type: tubefeeds ?DVT prophylaxis: SCD ?GI prophylaxis: PPI ?Lines: see above ?Foley:  N/A and removal ordered  ?Code Status:  full code ?Last date of multidisciplinary goals of care discussion [family too be updated later today] ? ?Labs:  ? ? ?  Latest Ref Rng & Units 09/08/2021  ? 12:48 AM 09/07/2021  ?  9:10 AM 09/07/2021  ?  5:15 AM  ?CMP  ?Glucose 70 - 99 mg/dL 213    204    ?BUN 8 -  23 mg/dL 99    97    ?Creatinine 0.44 - 1.00 mg/dL 0.98    0.97    ?Sodium 135 - 145 mmol/L 148   147   147    ?Potassium 3.5 - 5.1 mmol/L 4.5   4.3   4.3    ?Chloride 98 - 111 mmol/L 114    112    ?CO2 22 - 32 mmol/L 26    28    ?Calcium 8.9 - 10.3 mg/dL 9.9    9.8    ?Total Protein 6.5 - 8.1 g/dL 5.8    5.6    ?Total Bilirubin 0.3 - 1.2 mg/dL 0.5    0.5    ?Alkaline Phos 38 - 126 U/L 301    327    ?AST 15 - 41 U/L 62    75    ?ALT 0 - 44 U/L 77    81    ? ? ? ?  Latest Ref Rng & Units 09/08/2021  ? 12:48 AM 09/07/2021  ?  9:10 AM 09/07/2021  ?  5:15 AM  ?CBC  ?WBC 4.0 - 10.5 K/uL 2.9  1.5    ?Hemoglobin 12.0 - 15.0 g/dL 8.2   8.8   8.3    ?Hematocrit 36.0 - 46.0 % 25.9   26.0   25.8    ?Platelets 150 - 400 K/uL 34    33    ? ? ?ABG ?   ?Component Value Date/Time  ? PHART 7.463 (H) 09/07/2021 0910  ? PCO2ART 40.5 09/07/2021 0910  ? PO2ART 66 (L) 09/07/2021 0910  ? HCO3 28.9 (H) 09/07/2021 0910  ? TCO2 30 09/07/2021 0910  ? ACIDBASEDEF 6.0 (H) 08/25/2021 1022  ? O2SAT 93 09/07/2021 0910  ? ? ?CBG (last 3)  ?Recent Labs  ?  09/07/21 ?2307 09/08/21 ?9150 09/08/21 ?0732  ?GLUCAP 220* 145* 121*  ? ? ?Critical care time: 42 minutes  ?Chesley Mires, MD ?[place] ?Pager - [phone] ?09/08/2021, 8:36 AM ? ? ? ? ? ? ? ?

## 2021-09-08 NOTE — Progress Notes (Signed)
Ok to tx with plt = 34,000 and ANC = 1.4.  Will increase etoposide to full dose 150mg /m2 d/t CrCl > 50 mL/min per Dr Mosetta Putt ?

## 2021-09-08 NOTE — TOC CM/SW Note (Signed)
Received message from Lance Sell, Case Manager with [facility], requesting call back:  call returned; left message on voicemail.  Phone # : [phone].   ? ?Reinaldo Raddle, RN, BSN  ?Trauma/Neuro ICU Case Manager ?[phone]  ?

## 2021-09-08 NOTE — Progress Notes (Addendum)
HEMATOLOGY-ONCOLOGY PROGRESS NOTE ? ?ASSESSMENT AND PLAN: ?61 yo female  ?  ?Acute encephalopathy, ? 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  ?Recommendations: ?-Labs reviewed today.  Her WBC is 2.9 with an ANC 1.4, hemoglobin 8.2, platelets 34,000, albumin 1.7, AST 62, ALT 77, alk phos 301. ?-We will continue dexamethasone 20 mg IV daily and administer etoposide 150 mg per metered squared today.  Next dose of etoposide is due on Friday, 09/12/2021. ?-Will give with Granix 480 mcg subcu on 4/4 and 4/5.  Hold beginning 4/6 in anticipation of chemotherapy on Friday. ?-Transfuse PRBCs per ICU protocol.  Transfuse platelets for platelet count less than 20,000 or active bleeding. ?-Continue to monitor daily CBC with differential. ?-We again reviewed with the patient's niece that she is critically ill and the possibility of death is very high even with treatment for [place].  Last met with palliative care on 09/06/2021 and family wishes to continue full code/full scope of treatment.  Palliative care following peripherally at this point as goals are clear. ? ?Mikey Bussing, DNP, AGPCNP-BC, AOCNP ? ?SUBJECTIVE: Intubated, sedated.  Niece and sister at the bedside.  Had a low-grade fever overnight.  No bleeding. ? ?REVIEW OF SYSTEMS:   ?Review of Systems  ?Reason unable to perform ROS: Sedated, on ventilator.  ? ?PHYSICAL EXAMINATION: ?ECOG PERFORMANCE STATUS: 4 - Bedbound ? ?Vitals:  ? 09/08/21 1406 09/08/21 1407  ?BP: (!) 154/90   ?Pulse: 91   ?Resp: (!) 21   ?Temp:    ?SpO2: 97% 97%  ? ?Filed Weights  ? 08/28/21 0356 08/29/21 0500 08/30/21 0500  ?Weight: 70.4 kg  69.2 kg 70 kg  ? ? ?Intake/Output from previous day: ?04/02 0701 - 04/03 0700 ?In: 2273.5 [I.V.:486.5; NG/GT:1430; IV QASTMHDQQ:229] ?Out: 1950 [Urine:1950] ? ?Physical Exam ?Vitals reviewed.  ?Constitutional:   ?   Comments: Sedated, on ventilator ?Generalized edema noted  ?HENT:  ?   Head: Normocephalic.  ?Musculoskeletal:  ?   Right lower leg: Edema present.  ?   Left lower leg: Edema present.  ?Skin: ?   General: Skin is warm and dry.  ?   Comments: Ecchymoses to right neck  ?Neurological:  ?   Comments: Sedated.  ? ? ?LABORATORY DATA:  ?I have reviewed the data as listed ? ?  Latest Ref Rng & Units 09/08/2021  ? 12:48 AM 09/07/2021  ?  9:10 AM 09/07/2021  ?  5:15 AM  ?CMP  ?Glucose 70 - 99 mg/dL 213    204    ?BUN 8 - 23 mg/dL 99    97    ?Creatinine 0.44 - 1.00 mg/dL 0.98    0.97    ?Sodium 135 - 145 mmol/L 148   147   147    ?Potassium 3.5 - 5.1 mmol/L 4.5   4.3   4.3    ?Chloride 98 - 111 mmol/L 114    112    ?CO2 22 - 32 mmol/L 26    28    ?Calcium 8.9 - 10.3 mg/dL 9.9    9.8    ?Total Protein 6.5 - 8.1 g/dL 5.8    5.6    ?Total Bilirubin 0.3 - 1.2 mg/dL 0.5    0.5    ?Alkaline Phos  38 - 126 U/L 301    327    ?AST 15 - 41 U/L 62    75    ?ALT 0 - 44 U/L 77    81    ? ? ?Lab Results  ?Component Value Date  ? WBC 2.9 (L) 09/08/2021  ? HGB 8.2 (L) 09/08/2021  ? HCT 25.9 (L) 09/08/2021  ? MCV 88.1 09/08/2021  ? PLT 34 (L) 09/08/2021  ? NEUTROABS 1.4 (L) 09/08/2021  ? ? ?Lab Results  ?Component Value Date  ? CEA1 36.4 (H) 08/28/2021  ? ? ?CT HEAD WO CONTRAST (5MM) ? ?Result Date: 09/05/2021 ?CLINICAL DATA:  Altered mental status. EXAM: CT HEAD WITHOUT CONTRAST TECHNIQUE: Contiguous axial images were obtained from the base of the skull through the vertex without intravenous contrast. RADIATION DOSE REDUCTION: This exam was performed according to the departmental dose-optimization program which includes automated exposure control, adjustment of the mA and/or kV according to patient size and/or use of iterative  reconstruction technique. COMPARISON:  August 17, 2021. FINDINGS: Brain: Mild diffuse cortical atrophy is noted. No mass effect or midline shift is noted. Ventricular size is within normal limits. There is no evidence of mass lesion, hemorrhage or acute infarction. Vascular: No hyperdense vessel or unexpected calcification. Skull: Normal. Negative for fracture or focal lesion. Sinuses/Orbits: Left frontal, ethmoid and maxillary sinusitis is noted. Other: Fluid is noted in the mastoid air cells bilaterally. IMPRESSION: No acute intracranial abnormality seen. Electronically Signed   By: Marijo Conception M.D.   On: 09/05/2021 14:37  ? ?CT HEAD WO CONTRAST (5MM) ? ?Result Date: 09/03/2021 ?CLINICAL DATA:  61 year old female with history of delirium. Lower abdominal pain and constipation. EXAM: CT HEAD WITHOUT CONTRAST TECHNIQUE: Contiguous axial images were obtained from the base of the skull through the vertex without intravenous contrast. RADIATION DOSE REDUCTION: This exam was performed according to the departmental dose-optimization program which includes automated exposure control, adjustment of the mA and/or kV according to patient size and/or use of iterative reconstruction technique. COMPARISON:  Head CT 08/06/2021. FINDINGS: Brain: Mild cerebral atrophy. Patchy and confluent areas of decreased attenuation are noted throughout the deep and periventricular white matter of the cerebral hemispheres bilaterally, compatible with chronic microvascular ischemic disease. No evidence of acute infarction, hemorrhage, hydrocephalus, extra-axial collection or mass lesion/mass effect. Vascular: No hyperdense vessel or unexpected calcification. Skull: Normal. Negative for fracture or focal lesion. Sinuses/Orbits: No acute finding. Other: None. IMPRESSION: 1. No acute intracranial abnormalities. 2. Mild cerebral atrophy with chronic microvascular ischemic changes in the cerebral white matter, as above. Electronically Signed   By:  Vinnie Langton M.D.   On: 08/16/2021 06:31  ? ?CT ABDOMEN W CONTRAST ? ?Result Date: 09/03/2021 ?CLINICAL DATA:  Candidiasis.  Evaluate for intra-abdominal abscess. EXAM: CT ABDOMEN WITH CONTRAST TECHNIQUE: Multidetector CT imaging of the abdomen was performed using the standard protocol following bolus administration of intravenous contrast. RADIATION DOSE REDUCTION: This exam was performed according to the departmental dose-optimization program which includes automated exposure control, adjustment of the mA and/or kV according to patient size and/or use of iterative reconstruction technique. CONTRAST:  175mL OMNIPAQUE IOHEXOL 300 MG/ML  SOLN COMPARISON:  08/28/2021 FINDINGS: Lower chest: Significant interval progression of bilateral interstitial and airspace opacities within both lungs. Hepatobiliary: No focal liver abnormality. Small stone within the gallbladder measures 3 mm. No signs of gallbladder wall thickening or bile duct dilatation. Pancreas: Unremarkable. No pancreatic ductal dilatation or surrounding inflammatory changes. Spleen: Normal in size without focal abnormality. Adrenals/Urinary  Tract: Normal right adrenal gland. 2 cm left adrenal nodule is identified measuring 22.6 Hounsfield units, image 37/3. Unchanged from previous exam. No nephrolithiasis, hydronephrosis, or mass. Stomach/Bowel: Enteric tube tip is in the body of the stomach. Enteric contrast material is noted within the gastric lumen. The visualized bowel loops are nondilated. No wall thickening or bowel inflammation identified. Vascular/Lymphatic: No significant vascular findings are present. No enlarged abdominal or pelvic lymph nodes. Other: No free fluid or fluid collections. No signs of intra-abdominal abscess. Diffuse subcutaneous edema is identified compatible with anasarca. Musculoskeletal: No acute osseous findings. IMPRESSION: 1. No signs of intra-abdominal abscess. 2. Significant interval progression of bilateral interstitial  and airspace opacities within both lungs. Imaging findings are concerning for multifocal inflammation/infection. Atypical infection not excluded. 3. Diffuse subcutaneous edema compatible with anasarca. 4. Rachael Fee

## 2021-09-08 NOTE — Consult Note (Signed)
[facility] Nurse re-consult Note: ?Reason for Consult: Initial WOC consult for sacrum wound was performed 3/20.  Requested to reassess the location.  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 Red moist woundbed, bone palpable, mod amt tan drainage. Wound has enlarged since previous consult.  Pt is critically ill with multiple systemic factors that can impair healing.  ?Pressure Injury POA: Yes ?Dressing procedure/placement/frequency: Discontinued use of Iodoform packing and new topical treatment orders provided for bedside nurses to perform as follows to absorb drainage and promote healing: Cleanse coccyx wound with NS and pat dry. Cut piece of Aquacel Kellie Simmering # [phone]) and tuck into sacrum wound, using swab to fill, then cover with silicone foam.  (Change foam dressing Q 3 days or PRN soiling.) ?Please re-consult if further assistance is needed.  Thank-you,  ?Julien Girt MSN, RN, [place], [place], CNS ?[phone]  ? ?  ?

## 2021-09-08 NOTE — Progress Notes (Signed)
Etoposide administered and verified by telephone with Barbara Cower, RN at [facility]. Consent on file. ? ?Chemo paused at 11:22 with RR of 42 and BP of 182/118 HR of 98 ? ?Recheck vitals at 11:27  176/113, HR 104, and Respirations 49  ? ?Patient was re-challenged at 12:11 following suctioning and additional sedation/pain medication for agitation. ? ?VS returned to baseline. Dr. Craige Cotta and Dr. Mosetta Putt agreed patient can resume treatment.  ? ?Etoposide completed at 1301. Patient tolerated remaining treatment well. Family at bedside.  ? ? ?

## 2021-09-09 ENCOUNTER — Inpatient Hospital Stay (HOSPITAL_COMMUNITY): Payer: Federal, State, Local not specified - PPO

## 2021-09-09 DIAGNOSIS — D701 Agranulocytosis secondary to cancer chemotherapy: Secondary | ICD-10-CM | POA: Diagnosis not present

## 2021-09-09 DIAGNOSIS — D61818 Other pancytopenia: Secondary | ICD-10-CM | POA: Diagnosis not present

## 2021-09-09 DIAGNOSIS — D761 Hemophagocytic lymphohistiocytosis: Secondary | ICD-10-CM | POA: Diagnosis not present

## 2021-09-09 DIAGNOSIS — B377 Candidal sepsis: Secondary | ICD-10-CM | POA: Diagnosis not present

## 2021-09-09 LAB — GLUCOSE, CAPILLARY
Glucose-Capillary: 172 mg/dL — ABNORMAL HIGH (ref 70–99)
Glucose-Capillary: 178 mg/dL — ABNORMAL HIGH (ref 70–99)
Glucose-Capillary: 176 mg/dL — ABNORMAL HIGH (ref 70–99)
Glucose-Capillary: 240 mg/dL — ABNORMAL HIGH (ref 70–99)
Glucose-Capillary: 162 mg/dL — ABNORMAL HIGH (ref 70–99)
Glucose-Capillary: 157 mg/dL — ABNORMAL HIGH (ref 70–99)

## 2021-09-09 LAB — CBC WITH DIFFERENTIAL/PLATELET
Abs Immature Granulocytes: 0.09 10*3/uL — ABNORMAL HIGH (ref 0.00–0.07)
Basophils Absolute: 0 10*3/uL (ref 0.0–0.1)
Basophils Relative: 1 %
Eosinophils Absolute: 0 10*3/uL (ref 0.0–0.5)
Eosinophils Relative: 0 %
HCT: 23.9 % — ABNORMAL LOW (ref 36.0–46.0)
Hemoglobin: 7.6 g/dL — ABNORMAL LOW (ref 12.0–15.0)
Immature Granulocytes: 3 %
Lymphocytes Relative: 9 %
Lymphs Abs: 0.3 10*3/uL — ABNORMAL LOW (ref 0.7–4.0)
MCH: 28.3 pg (ref 26.0–34.0)
MCHC: 31.8 g/dL (ref 30.0–36.0)
MCV: 88.8 fL (ref 80.0–100.0)
Monocytes Absolute: 0.2 10*3/uL (ref 0.1–1.0)
Monocytes Relative: 5 %
Neutro Abs: 2.9 10*3/uL (ref 1.7–7.7)
Neutrophils Relative %: 82 %
Platelets: 27 10*3/uL — CL (ref 150–400)
RBC: 2.69 MIL/uL — ABNORMAL LOW (ref 3.87–5.11)
RDW: 20.2 % — ABNORMAL HIGH (ref 11.5–15.5)
WBC: 3.5 10*3/uL — ABNORMAL LOW (ref 4.0–10.5)
nRBC: 20.4 % — ABNORMAL HIGH (ref 0.0–0.2)

## 2021-09-09 LAB — BASIC METABOLIC PANEL
Anion gap: 7 (ref 5–15)
BUN: 107 mg/dL — ABNORMAL HIGH (ref 8–23)
CO2: 27 mmol/L (ref 22–32)
Calcium: 9.1 mg/dL (ref 8.9–10.3)
Chloride: 113 mmol/L — ABNORMAL HIGH (ref 98–111)
Creatinine, Ser: 1.01 mg/dL — ABNORMAL HIGH (ref 0.44–1.00)
GFR, Estimated: >60 mL/min (ref >60)
Glucose, Bld: 171 mg/dL — ABNORMAL HIGH (ref 70–99)
Potassium: 4.2 mmol/L (ref 3.5–5.1)
Sodium: 147 mmol/L — ABNORMAL HIGH (ref 135–145)

## 2021-09-09 LAB — CYTOLOGY - NON PAP

## 2021-09-09 IMAGING — DX DG CHEST 1V PORT
1 series · 1 of 1 positions shown · non-contrast
Comparison: Chest x-ray dated [DATE]

CLINICAL DATA: Respiratory failure

EXAM:
PORTABLE CHEST 1 VIEW

[chest]
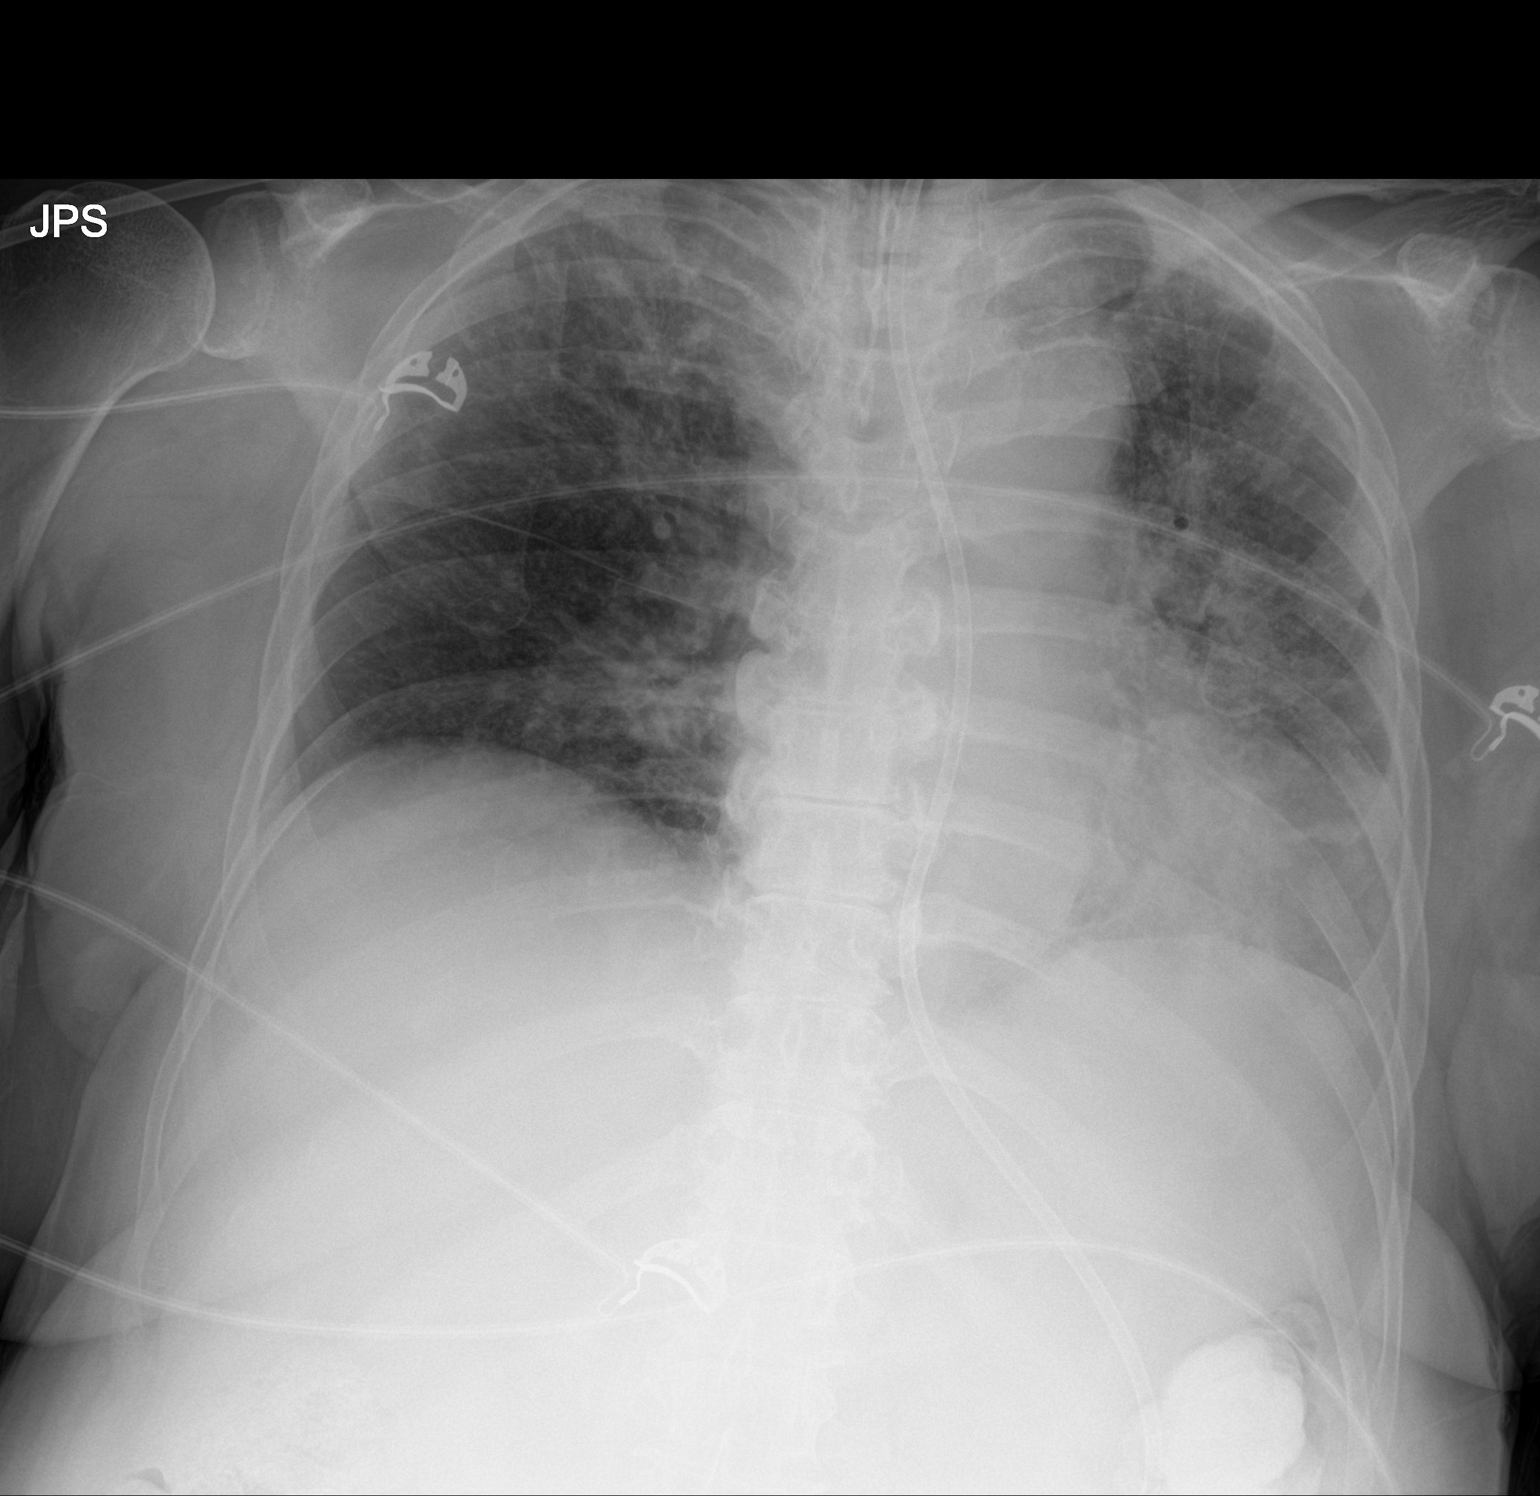

[1 of 1 positions shown; findings below may reference images not displayed]

FINDINGS: Cardiac and mediastinal contours are unchanged. Unchanged position
of ET tube and enteric feeding tube. Unchanged left greater than
right heterogeneous opacities. No large pleural effusion or
pneumothorax.
IMPRESSION: Stable support devices.

## 2021-09-09 MED ORDER — VITAL 1.5 CAL PO LIQD
1000.0000 mL | ORAL | Status: DC
Start: 2021-09-09 — End: 2021-09-22
  Administered 2021-09-09 – 2021-09-20 (×9): 1000 mL
  Filled 2021-09-09: qty 1000

## 2021-09-09 MED ORDER — FUROSEMIDE 10 MG/ML IJ SOLN
20.0000 mg | Freq: Once | INTRAMUSCULAR | Status: AC
  Administered 2021-09-09: 20 mg via INTRAVENOUS
  Filled 2021-09-09: qty 2

## 2021-09-09 NOTE — Progress Notes (Signed)
? ?NAME:  Carrie [name], MRN:  [id number], DOB:  03/08/1961, LOS: 88 ?ADMISSION DATE:  09/01/2021, CONSULTATION DATE:  3/17 ?REFERRING MD:  Darrick Meigs, CHIEF COMPLAINT:  Confusion  ? 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  Found to have pancytopenia and seen by hematology, and initially felt to be related to mycophenolate and steroid therapy.  Bone marrow biopsy showed hypercellular marrow w/o blasts or malignant cells.  She developed worsening encephalopathy on 3/16 and neuro consulted.  She was started on therapy for meningitis and viral encephalitis, and had LP done.  She had new onset seizure and required intubation for airway protection.  She was transferred from [facility] to [facility] for LTM. Later felt to have [place] and started on dexamethasone and etoposide.  Course complicated by Candidal fungemia likely from PICC line. ? ?Pertinent  Medical History  ?Rheumatoid arthritis, Dermatomyositis, Fatty liver, HLD, HTN, Depression, Anxiety ? 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 ?3/26 Candida fungemia for which started on micafungin. Dex and Etoposide stopped. [place] discussion with family - palliative care consulted  ?3/28 Febrile overnight for which cefepime added. WBC dropped to 1.4; started on Granix inj. ?3/31 restart dexamethasone ?4/01 Bronch/BAL ?4/03 restart etoposide ?4/04 change to pressure control to improve vent synchrony ? ?Studies:  ?03/2021 labs >> ANA  positive, Jo-1 neg, centromere neg, ds DNA neg, RNP positive, SSA/SSB neg, RF positive, chromatin Ab neg, CCP neg ?MRI 3/13 >> no acute findings ?3/17 bone marrow biopsy >> hypercellular bone marrow with trilineage hematopoiesis, comment: non-specific, follow up cytogenetic studies ?3/12 CT abdomen and pelvis >> no acute findings, adrenal nodule, diverticulosis ?3/12 CT head without contrast >> no acute findings ?3/16 EEG >> moderate diffuse encephalopathy of nonspecific etiology, no seizures or epileptiform discharges noted during the recording ?3/19 Echo >> EF 60 to 65%, mild LVH, grade 1 DD, mild/mod TR ?3/29 CT abd >> b/l ASD, small GB stone, diffuse subcutaneous edema ?3/31 CT head >> mild cortical atrophy ? ?Micro:  ?3/16 parvoB19 IgG pos, IgM neg ?3/16 EBV IgG pos, IgM neg ?3/16 CMV IgG pos, IgM neg ?3/16 HHV6 IgG pos ?3/17 bone marrow biopsy > hypercellular with trilineage hematopoiesis ?3/23 blood culture 1/4 > Candida glabrata ?4/01 BAL > ?4/01 PCP > ?4/01 BAL AFB >  ?4/01 BAL Fungus >  ? ?Antibiotics:  ?Rocephin 3/12 ?Acyclovir 3/16>3/19  ?Unasyn 3/19 > 3/24 ?Micafungin 3/24 >  ?Cefepime 3/28 >  ? ?Interim History / Subjective:  ?Got chemo yesterday. ? ?Objective   ?Blood pressure (!) 99/57, pulse 89, temperature 99.5 ?F (37.5 ?C), temperature source Axillary, resp. rate (!) 21, height _0  (1.6 m), weight 68.7 kg, SpO2 99 %. ?   ?Vent Mode: PCV ?FiO2 (%):  [40 %] 40 % ?Set Rate:  [16 bmp-18 bmp] 18 bmp ?Vt Set:  [420 mL] 420 mL ?PEEP:  [5 cmH20] 5 cmH20 ?Plateau Pressure:  [13 NOI37-04 cmH20] 21 cmH20  ? ?Intake/Output Summary (Last 24 hours) at 09/09/2021 0745 ?Last data filed at 09/09/2021 0600 ?Gross per 24 hour  ?Intake 2416.85  ml  ?Output 2400 ml  ?Net 16.85 ml  ? ?Filed Weights  ? 08/29/21 0500 08/30/21 0500 09/09/21 0500  ?Weight: 69.2 kg 70 kg 68.7 kg  ? ? ?Examination: ? ?General - sedated ?Eyes - pupils reactive ?ENT - ETT in place ?Cardiac - regular rate/rhythm, no murmur ?Chest - b/l rhonchi Lt  > Rt ?Abdomen - soft, non tender, + bowel sounds ?Extremities - 1+ edema ?Skin - no rashes ?Neuro - RASS -2 ? ?Resolved Hospital Problem list   ?Aspiration pneumonia, Septic shock, Shock liver, AKI from ATN in setting of sepsis ? ?Assessment & Plan:  ? ?Candidal fungemia in setting of chronic immunosuppression. ?HCAP. ?- ID consulted ?- continue micafungin through 09/14/21 ?- day 8 of cefepime ?- f/u BAL culture/AFB/Fungal/PCP from 4/01 ? ?Acute metabolic encephalopathy, cause uncertain. ?New onset seizure. ?Hx of anxiety and depression. ?- concern for autoimmune process versus [place] with CNS involvement ?- RASS goal 0 to -1 ?- continue abilify, lexapro, klonopin ?- continue keppra ? ?[place]. ?- hematology/oncology consulted ?- primary versus MAS in setting of connective tissue disease ?- based on lab markers (increased IL-2 receptor) ?- dexamethasone resumed on 3/31 and etoposide on 4/03 ? ?Pancytopenia. ?- from chemo and [place] ?- f/u CBC with diff ?- transfuse for Hb < 7, PLT < 20K ? ?Acute hypoxic respiratory failure from HCAP. ?- change to pressure control on 4/04 to improve vent synchrony ?- not a candidate for extubation trial at this time ?- might need trach if family wishes to continue aggressive therapy ?- f/u CXR intermittently ? ?Steroid induced hyperglycemia. ?- SSI with tube feed coverage and semglee ? ?Hx of dermatomyositis and rheumatoid arthritis. ?- continue dexamethasone ? ?Hx of HTN. ?Hypervolemia. ?- continue lopressor ?- lasix 20 mg IV x one on 4/04 ? ?Pressure injury. ?- stage 3, Rt hand, POA ?- stage 3, sacrum, POA ?- stage 2, Lt buttock, no present on admission ?- wound care ? ?Goals of care> ?- Family would like to continue with full scope of care at this time ?- Palliative care consulted, appreciate assistance ? ?Best Practice (right click and "Reselect all SmartList Selections" daily)  ? ?Diet/type: tubefeeds ?DVT prophylaxis: SCD ?GI prophylaxis: PPI ?Lines: see above ?Foley:  N/A and removal  ordered  ?Code Status:  full code ?Last date of multidisciplinary goals of care discussion ? ?Labs:  ? ? ?  Latest Ref Rng & Units 09/09/2021  ?  3:28 AM 09/08/2021  ? 12:48 AM 09/07/2021  ?  9:10 AM  ?CMP  ?Glucose 70 - 99 mg/dL 171   213     ?BUN 8 - 23 mg/dL 107   99     ?Creatinine 0.44 - 1.00 mg/dL 1.01   0.98     ?Sodium 135 - 145 mmol/L 147   148   147    ?Potassium 3.5 - 5.1 mmol/L 4.2   4.5   4.3    ?Chloride 98 - 111 mmol/L 113   114     ?CO2 22 - 32 mmol/L 27   26     ?Calcium 8.9 - 10.3 mg/dL 9.1   9.9     ?Total Protein 6.5 - 8.1 g/dL  5.8     ?Total Bilirubin 0.3 - 1.2 mg/dL  0.5     ?Alkaline Phos 38 - 126 U/L  301     ?AST 15 - 41 U/L  62     ?ALT 0 - 44 U/L  77     ? ? ? ?  Latest Ref Rng & Units 09/09/2021  ?  3:28 AM 09/08/2021  ? 12:48 AM 09/07/2021  ?  9:10 AM  ?CBC  ?WBC 4.0 - 10.5 K/uL 3.5   2.9     ?Hemoglobin 12.0 - 15.0 g/dL 7.6   8.2   8.8    ?Hematocrit 36.0 - 46.0 % 23.9   25.9   26.0    ?Platelets 150 - 400 K/uL 27   34     ? ? ?ABG ?   ?Component Value Date/Time  ? PHART 7.463 (H) 09/07/2021 0910  ? PCO2ART 40.5 09/07/2021 0910  ? PO2ART 66 (L) 09/07/2021 0910  ? HCO3 28.9 (H) 09/07/2021 0910  ? TCO2 30 09/07/2021 0910  ? ACIDBASEDEF 6.0 (H) 08/25/2021 1022  ? O2SAT 93 09/07/2021 0910  ? ? ?CBG (last 3)  ?Recent Labs  ?  09/08/21 ?1913 09/08/21 ?2303 09/09/21 ?0311  ?GLUCAP 158* 172* 172*  ? ? ?Critical care time: 32 minutes  ?Chesley Mires, MD ?[place] ?Pager - [phone] ?09/09/2021, 7:45 AM ? ? ? ? ? ? ? ?

## 2021-09-09 NOTE — Progress Notes (Signed)
?   ? ?[facility] ? ?Date of Admission:  08/08/2021    ?       ?Reason for visit: Follow up on candidemia ? ?Current antibiotics: ?Micafungin 3/24-- present ?Cefepime 3/28-- present ? ?ASSESSMENT:   ? ?61 y.o. female admitted with: ? ?Candida glabrata fungemia: Blood cultures positive on 3/23 in 1 out of 4 bottles.  Presumably from her PICC line that was removed on 3/25 (initially placed 3/18).  Prior blood cultures were negative on 3/12 and 3/16.  Repeat blood cultures 3/26 were finalized as no growth. ?HLH: Diagnosis consistent with HLH based on her lab markers.  Hematology is following and has resumed high-dose steroids as well as resumed etoposide yesterday. ?Pancytopenia: In the setting of HLH and recent chemotherapy.  Her ANC is improved today up to 2.9. ?Neutropenic fevers: Neutropenia has resolved and Tmax over last 24 hours is 99.8. ?Bilateral airspace opacities: Tracheal aspirate obtained 3/31 negative.  BAL studies done 4/1 also unremarkable.  Received appropriate course of pneumonia therapy. ?Stage IV sacral ulcer: Evaluated by wound care 4/3 and does not appear grossly infected. ?Rheumatoid arthritis/dermatomyositis diagnosis ? ?RECOMMENDATIONS:   ? ?Continue micafungin for 2 weeks.  End date 09/14/2021. ?We will discontinue cefepime today given resolution of neutropenia and no longer febrile.  Today is day #8 of therapy. ?HLH management per hematology/oncology ?Continue wound care ?For the time being, will sign off.  Please call as needed. ? ? ?Principal Problem: ?  Candidemia (HCC) ?Active Problems: ?  Hyperlipidemia ?  Hypertension ?  Generalized weakness ?  Rheumatoid arthritis flare (HCC) ?  Depression with anxiety ?  Lactic acidosis ?  Pancytopenia (HCC) ?  Volume depletion ?  Long-term corticosteroid use ?  Open wound of right hand ?  Adrenal nodule (HCC) ?  Pressure injury of skin ?  High anion gap metabolic acidosis ?  Altered mental status ?  Metabolic  encephalopathy ?  Encephalitis ?  Acute encephalopathy ?  HLH (hemophagocytic lymphohistiocytosis) (HCC) ?  Goals of care, counseling/discussion ? ? ? ?MEDICATIONS:   ? ?Scheduled Meds: ? ARIPiprazole  5 mg Per Tube QHS  ? chlorhexidine gluconate (MEDLINE KIT)  15 mL Mouth Rinse BID  ? Chlorhexidine Gluconate Cloth  6 each Topical Q0600  ? clonazepam  1 mg Per Tube BID  ? escitalopram  20 mg Per Tube Daily  ? feeding supplement (PROSource TF)  90 mL Per Tube BID  ? folic acid  1 mg Per Tube Daily  ? insulin aspart  0-15 Units Subcutaneous Q4H  ? insulin aspart  4 Units Subcutaneous Q4H  ? insulin glargine-yfgn  20 Units Subcutaneous Daily  ? levETIRAcetam  500 mg Per Tube BID  ? mouth rinse  15 mL Mouth Rinse 10 times per day  ? metoprolol tartrate  12.5 mg Per Tube BID  ? multivitamin with minerals  1 tablet Per Tube Daily  ? mupirocin ointment   Topical BID  ? nutrition supplement (JUVEN)  1 packet Per Tube BID BM  ? pantoprazole sodium  40 mg Per Tube Daily  ? sodium chloride flush  10-40 mL Intracatheter Q12H  ? Tbo-filgastrim (GRANIX) SQ  480 mcg Subcutaneous Daily  ? thiamine  100 mg Per Tube Daily  ? ?Continuous Infusions: ? sodium chloride 250 mL (08/30/21 2121)  ? ceFEPime (MAXIPIME) IV 2 g (09/09/21 0911)  ? dexamethasone (DECADRON) IVPB ([facility]) Stopped (09/08/21 0830)  ? dexmedetomidine (PRECEDEX) IV infusion    ? feeding supplement (  VITAL 1.5 CAL) 1,000 mL (09/09/21 0906)  ? fentaNYL infusion INTRAVENOUS 200 mcg/hr (09/09/21 0800)  ? micafungin (MYCAMINE) IV Stopped (09/08/21 2301)  ? ?PRN Meds:.acetaminophen (TYLENOL) oral liquid 160 mg/5 mL **OR** acetaminophen **OR** acetaminophen, alteplase, fentaNYL, heparin lock flush, heparin lock flush, ipratropium-albuterol, lip balm, midazolam, sodium chloride flush, sodium chloride flush, sodium chloride flush ? ?SUBJECTIVE:  ? ?24 hour events:  ?Status post etoposide ?ANC 2.9 ?Tmax 99.8. ? ?Remains sedated and intubated in the ICU. ? ?Review of Systems   ?Unable to perform ROS: Intubated  ? ?  ?OBJECTIVE:  ? ?Blood pressure 112/69, pulse 91, temperature 98.7 ?F (37.1 ?C), temperature source Axillary, resp. rate 15, height 5' 3" (1.6 m), weight 68.7 kg, SpO2 99 %. ?Body mass index is 26.83 kg/m?. ? ?Physical Exam ?Constitutional:   ?   Comments: Chronically ill-appearing woman, critically ill, lying in the ICU, no acute distress.  ?HENT:  ?   Head: Normocephalic and atraumatic.  ?   Comments: ET tube in place ?Eyes:  ?   Comments: Her eyes are open but she is not tracking  ?Cardiovascular:  ?   Rate and Rhythm: Normal rate and regular rhythm.  ?Pulmonary:  ?   Comments: Coarse bilateral breath sounds diminished at the bases ?Abdominal:  ?   General: There is no distension.  ?   Palpations: Abdomen is soft.  ?   Tenderness: There is no abdominal tenderness.  ?Musculoskeletal:     ?   General: Swelling present.  ?   Cervical back: Normal range of motion and neck supple.  ?Skin: ?   General: Skin is warm and dry.  ?   Findings: Bruising present. No rash.  ?Neurological:  ?   Mental Status: Mental status is at baseline.  ?   Comments: No significant change in mental status.  Does appear to respond to pain.  ? ? ? ?Lab Results: ?Lab Results  ?Component Value Date  ? WBC 3.5 (L) 09/09/2021  ? HGB 7.6 (L) 09/09/2021  ? HCT 23.9 (L) 09/09/2021  ? MCV 88.8 09/09/2021  ? PLT 27 (LL) 09/09/2021  ?  ?Lab Results  ?Component Value Date  ? NA 147 (H) 09/09/2021  ? K 4.2 09/09/2021  ? CO2 27 09/09/2021  ? GLUCOSE 171 (H) 09/09/2021  ? BUN 107 (H) 09/09/2021  ? CREATININE 1.01 (H) 09/09/2021  ? CALCIUM 9.1 09/09/2021  ? GFRNONAA >60 09/09/2021  ? GFRAA  08/24/2008  ?  >60        ?The eGFR has been calculated ?using the MDRD equation. ?This calculation has not been ?validated in all clinical ?situations. ?eGFR's persistently ?<60 mL/min signify ?possible Chronic Kidney Disease.  ?  ?Lab Results  ?Component Value Date  ? ALT 77 (H) 09/08/2021  ? AST 62 (H) 09/08/2021  ? ALKPHOS 301  (H) 09/08/2021  ? BILITOT 0.5 09/08/2021  ? ? ?   ?Component Value Date/Time  ? CRP 19.4 (H) 08/26/2021 1134  ? ? ?   ?Component Value Date/Time  ? ESRSEDRATE 6 08/26/2021 1134  ? ?  ?I have reviewed the micro and lab results in Epic. ? ?Imaging: ?DG Chest Port 1 View ? ?Result Date: 09/09/2021 ?CLINICAL DATA:  Respiratory failure EXAM: PORTABLE CHEST 1 VIEW COMPARISON:  Chest x-ray dated September 02, 2021 FINDINGS: Cardiac and mediastinal contours are unchanged. Unchanged position of ET tube and enteric feeding tube. Unchanged left greater than right heterogeneous opacities. No large pleural effusion or pneumothorax. IMPRESSION: Stable support devices.   Electronically Signed   By: Leah  Strickland M.D.   On: 09/09/2021 08:06    ? ?Imaging independently reviewed in Epic.  ? ? ? N  ?[facility] ?[facility] ?[phone] pager ?09/09/2021, 10:43 AM ? ? ? ?

## 2021-09-09 NOTE — Progress Notes (Signed)
Date and time results received: 09/09/21 0515 ? ?Test: Platelets ?Critical Value: 27 ? ?Name of Provider Notified: E-link physician ? ?Orders Received? Or Actions Taken?: Previous note written by MD Verlee Monte notes necessity for transfusion of platelets only if platelet levels <20 or in the presence of active bleeding. ? ?No new orders received at this time.  ? ?RN will continue to monitor. ? ?Wyn Quaker, RN  ?

## 2021-09-09 NOTE — Progress Notes (Addendum)
Nutrition Follow-up ? ?DOCUMENTATION CODES:  ? ?Not applicable ? ?INTERVENTION:  ? ?Tube feeding via Cortrak tube (gastric): ?Increase Vital 1.5 to 60 ml/h (1440 ml per day) ?Prosource TF 90 ml BID ? ?Provides 2320 kcal, 141 gm protein, 1094 ml free water daily ? ?1 packet Juven BID, each packet provides 95 calories, 2.5 grams of protein (collagen), and 9.8 grams of carbohydrate (3 grams sugar); also contains 7 grams of L-arginine and L-glutamine, 300 mg vitamin C, 15 mg vitamin E, 1.2 mcg vitamin B-12, 9.5 mg zinc, 200 mg calcium, and 1.5 g  Calcium Beta-hydroxy-Beta-methylbutyrate to support wound healing ? ?Continue MVI with minerals daily per tube ? ?Add 500 mg Vitamin C BID per tube ?Add 220 mg Zinc daily x 2 weeks per tube ? ?NUTRITION DIAGNOSIS:  ? ?Increased nutrient needs related to acute illness, wound healing as evidenced by estimated needs. ?Ongoing.  ? ?GOAL:  ? ?Patient will meet greater than or equal to 90% of their needs ?Met with TF.  ? ?MONITOR:  ? ?Vent status, Labs, Weight trends, I & O's, Skin ? ?REASON FOR ASSESSMENT:  ? ?Consult ?Enteral/tube feeding initiation and management ? ?ASSESSMENT:  ? ?61 y.o. female past medical history of HTN, HLD, depression, anxiety, dermatomyositis, rheumatoid arthritis on Plaquenil, mycophenolate, and daily prednisone, and fatty liver. She presented to the ED due to AMS, abdominal pain, and several days of constipation with significantly decreased PO intake. Her niece reported that prednisone was decreased and then subsequently needed to be increased recently. Her niece reported that patient has had similar symptoms in the past when steroids have been tapered. She was admitted for lactic acidosis. ? ?Pt discussed during ICU rounds and with RN. Receiving treatment for candidal fungemia in setting of chronic immunosuppression. Hematology following for [facility] and receiving chemo. Pt developed pancytopenia from [facility] and chemo.   ?Pt with multiple wounds, wound care  consulted.  ? ?3/17 intubated  ?3/27 s/p cortrak placement; tip gastric per xray  ? ? ?Medications reviewed and include: folic acid, SSI, novolog, semglee, keppra, MVI with minerals, Juven, protonix, thiamine  ?Decadron ?Precedex  ?Fentanyl  ? ?Labs reviewed: Na 147 ?CBG's: 169-211 ? ?CRP: 19.4 (3/21) ?B1: 47.4 (3/12) ?B12: 2220 (3/14) ?Ferritin: >7500 (3/25) ? ?Current TF:  ?Vital 1.5 at 55 ml/h and Prosource TF 90 ml BID ?Provides 2140 kcal, 133 gm protein ? ?Diet Order:   ?Diet Order   ? ?       ?  Diet NPO time specified  Diet effective now       ?  ? ?  ?  ? ?  ? ? ?EDUCATION NEEDS:  ? ?No education needs have been identified at this time ? ?Skin:  Skin Assessment:  (MASD: thigh) ?Skin Integrity Issues:: Stage IV ?Stage II: buttocks ?Stage III: R hand and lip ?Stage IV: sacrum ? ?Last BM:  4/2 small, type 4 ? ?Height:  ? ?Ht Readings from Last 1 Encounters:  ?08/26/21 _0  (1.6 m)  ? ? ?Weight:  ? ?Wt Readings from Last 1 Encounters:  ?09/09/21 68.7 kg  ? ? ?Ideal Body Weight:  47.7 kg ? ?BMI:  Body mass index is 26.83 kg/m?. ? ?Estimated Nutritional Needs:  ? ?Kcal:  2000-2300 ? ?Protein:  125-140 grams ? ?Fluid:  >/= 2 L/day ? ?Lockie Pares., RD, LDN, CNSC ?See AMiON for contact information  ? ?

## 2021-09-10 DIAGNOSIS — D761 Hemophagocytic lymphohistiocytosis: Secondary | ICD-10-CM | POA: Diagnosis not present

## 2021-09-10 DIAGNOSIS — J9601 Acute respiratory failure with hypoxia: Secondary | ICD-10-CM | POA: Diagnosis not present

## 2021-09-10 DIAGNOSIS — B377 Candidal sepsis: Secondary | ICD-10-CM | POA: Diagnosis not present

## 2021-09-10 LAB — CBC WITH DIFFERENTIAL/PLATELET
Abs Immature Granulocytes: 0.24 10*3/uL — ABNORMAL HIGH (ref 0.00–0.07)
Basophils Absolute: 0 10*3/uL (ref 0.0–0.1)
Basophils Relative: 1 %
Eosinophils Absolute: 0 10*3/uL (ref 0.0–0.5)
Eosinophils Relative: 0 %
HCT: 26.2 % — ABNORMAL LOW (ref 36.0–46.0)
Hemoglobin: 8 g/dL — ABNORMAL LOW (ref 12.0–15.0)
Immature Granulocytes: 35 %
Lymphocytes Relative: 19 %
Lymphs Abs: 0.1 10*3/uL — ABNORMAL LOW (ref 0.7–4.0)
MCH: 27.5 pg (ref 26.0–34.0)
MCHC: 30.5 g/dL (ref 30.0–36.0)
MCV: 90 fL (ref 80.0–100.0)
Monocytes Absolute: 0 10*3/uL — ABNORMAL LOW (ref 0.1–1.0)
Monocytes Relative: 4 %
Neutro Abs: 0.3 10*3/uL — CL (ref 1.7–7.7)
Neutrophils Relative %: 41 %
Platelets: 24 10*3/uL — CL (ref 150–400)
RBC: 2.91 MIL/uL — ABNORMAL LOW (ref 3.87–5.11)
RDW: 20.1 % — ABNORMAL HIGH (ref 11.5–15.5)
WBC: 0.7 10*3/uL — CL (ref 4.0–10.5)
nRBC: 14.5 % — ABNORMAL HIGH (ref 0.0–0.2)

## 2021-09-10 LAB — BASIC METABOLIC PANEL
Anion gap: 7 (ref 5–15)
BUN: 118 mg/dL — ABNORMAL HIGH (ref 8–23)
CO2: 26 mmol/L (ref 22–32)
Calcium: 9.4 mg/dL (ref 8.9–10.3)
Chloride: 114 mmol/L — ABNORMAL HIGH (ref 98–111)
Creatinine, Ser: 0.96 mg/dL (ref 0.44–1.00)
GFR, Estimated: >60 mL/min (ref >60)
Glucose, Bld: 230 mg/dL — ABNORMAL HIGH (ref 70–99)
Potassium: 4.6 mmol/L (ref 3.5–5.1)
Sodium: 147 mmol/L — ABNORMAL HIGH (ref 135–145)

## 2021-09-10 LAB — URINALYSIS, ROUTINE W REFLEX MICROSCOPIC
Bilirubin Urine: NEGATIVE
Glucose, UA: 50 mg/dL — AB
Ketones, ur: NEGATIVE mg/dL
Leukocytes,Ua: NEGATIVE
Nitrite: NEGATIVE
Protein, ur: 100 mg/dL — AB
RBC / HPF: >50 RBC/hpf — ABNORMAL HIGH (ref 0–5)
Specific Gravity, Urine: 1.017 (ref 1.005–1.030)
pH: 7 (ref 5.0–8.0)

## 2021-09-10 LAB — PROTIME-INR
INR: 1.1 (ref 0.8–1.2)
Prothrombin Time: 14.4 seconds (ref 11.4–15.2)

## 2021-09-10 LAB — GLUCOSE, CAPILLARY
Glucose-Capillary: 207 mg/dL — ABNORMAL HIGH (ref 70–99)
Glucose-Capillary: 233 mg/dL — ABNORMAL HIGH (ref 70–99)
Glucose-Capillary: 238 mg/dL — ABNORMAL HIGH (ref 70–99)
Glucose-Capillary: 264 mg/dL — ABNORMAL HIGH (ref 70–99)
Glucose-Capillary: 186 mg/dL — ABNORMAL HIGH (ref 70–99)
Glucose-Capillary: 210 mg/dL — ABNORMAL HIGH (ref 70–99)

## 2021-09-10 LAB — APTT: aPTT: 23 seconds — ABNORMAL LOW (ref 24–36)

## 2021-09-10 MED ORDER — LACTATED RINGERS IV BOLUS
1000.0000 mL | Freq: Once | INTRAVENOUS | Status: AC
  Administered 2021-09-10: 1000 mL via INTRAVENOUS

## 2021-09-10 MED ORDER — FUROSEMIDE 10 MG/ML IJ SOLN: 40.0000 mg | Freq: Two times a day (BID) | INTRAMUSCULAR | Status: DC

## 2021-09-10 MED ORDER — ZINC SULFATE 220 (50 ZN) MG PO CAPS
220.0000 mg | ORAL_CAPSULE | Freq: Every day | ORAL | Status: DC
  Administered 2021-09-10 – 2021-09-21 (×12): 220 mg
  Filled 2021-09-10 (×12): qty 1

## 2021-09-10 MED ORDER — FUROSEMIDE 10 MG/ML IJ SOLN
40.0000 mg | Freq: Two times a day (BID) | INTRAMUSCULAR | Status: AC
  Administered 2021-09-10 (×2): 40 mg via INTRAVENOUS
  Filled 2021-09-10 (×2): qty 4

## 2021-09-10 MED ORDER — ASCORBIC ACID 500 MG PO TABS
500.0000 mg | ORAL_TABLET | Freq: Two times a day (BID) | ORAL | Status: DC
  Administered 2021-09-10 – 2021-09-21 (×23): 500 mg
  Filled 2021-09-10 (×23): qty 1

## 2021-09-10 NOTE — Progress Notes (Signed)
Inpatient Diabetes Program Recommendations ? ?AACE/ADA: New Consensus Statement on Inpatient Glycemic Control (2015) ? ?Target Ranges:  Prepandial:   less than 140 mg/dL ?     Peak postprandial:   less than 180 mg/dL (1-2 hours) ?     Critically ill patients:  140 - 180 mg/dL  ? ?Lab Results  ?Component Value Date  ? GLUCAP 238 (H) 09/10/2021  ? HGBA1C 6.2 (H) 2021-09-13  ? ? ?Review of Glycemic Control ? Latest Reference Range & Units 09/10/21 07:36 09/10/21 11:40  ?Glucose-Capillary 70 - 99 mg/dL 106 (H) 269 (H)  ?(H): Data is abnormally high ? ?Diabetes history: DM2 ? ?Current orders for Inpatient glycemic control: Semglee 20 units QAM, Novolog 0-15 units Q4H, Novolog 4 units Q4H, Decadron 20 mg QD, Vital @ 60 ml/hr ? ?Inpatient Diabetes Program Recommendations:   ? ?Tube feeds at goal of 60 ml/hr.  Please consider increasing Novolog tube feed coverage to 6 units Q4H. ? ?Will continue to follow while inpatient. ? ?Thank you, ?Dulce Sellar, MSN, RN ?Diabetes Coordinator ?Inpatient Diabetes Program ?[phone] (team pager from 8a-5p) ? ? ? ?

## 2021-09-10 NOTE — Progress Notes (Signed)
Pt returned to full support per Dr. Craige CottaSood. RT will continue to monitor and be available as needed.  ?

## 2021-09-10 NOTE — Progress Notes (Signed)
eLink Physician-Brief Progress Note ?Patient Name: Pershing CoxLivia S  ?DOB: 03/27/1961 ?MRN: [id number] ? ? ?Date of Service ? 09/10/2021  ?HPI/Events of Note ? UA results: ?Appearance: HAZY  ?Bilirubin Urine: NEGATIVE ?Color, Urine: YELLOW ?Glucose, UA: 50 ! ?Hgb urine dipstick: LARGE ! ?Ketones, ur: NEGATIVE ?Leukocytes,Ua: NEGATIVE ?Nitrite: NEGATIVE ?pH: 7.0 ?Protein: 100 ! ?Specific Gravity, Urine: 1.017 ?Bacteria, UA: RARE  ?RBC / HPF: >50 (H) ?WBC, UA: 6-10 ? ?Dark urine appears to be related to hematuria. Last Hgb = 7.6 and Platelets 27 from 09/09/2021 at 3:28 AM.  ?eICU Interventions ? Plan: ?Send AM labs now.  ?PT/INR and PTT STAT.  ? ? ? ?Intervention Category ?Major Interventions: Other: ? ?Rinoa Garramone Dennard NipEugene ?09/10/2021, 3:11 AM ?

## 2021-09-10 NOTE — Progress Notes (Signed)
eLink Physician-Brief Progress Note ?Patient Name: Carrie CoxLivia [name]  ?DOB: 06/30/1960 ?MRN: [id number] ? ? ?Date of Service ? 09/10/2021  ?HPI/Events of Note ? Nursing reports [place] draining very dark urine. Volume depletion vs hematuria.  ?eICU Interventions ? Plan: ?LR 1 liter IV over 1 hour now. ?UA with reflex microscopic now.   ? ? ? ?Intervention Category ?Major Interventions: Other: ? ?Stephine Langbehn Dennard NipEugene ?09/10/2021, 1:14 AM ?

## 2021-09-10 NOTE — Progress Notes (Signed)
? ?NAME:  Carrie [name], MRN:  [id number], DOB:  06/17/1960, LOS: 23 ?ADMISSION DATE:  08/11/2021, CONSULTATION DATE:  08/22/2021 ?REFERRING MD:  Darrick Meigs, CHIEF COMPLAINT:  Confusion  ? ?History of Present Illness:  ?61 year old woman with history of dermatomyositis and rheumatoid arthritis (on immunosuppressive therapy) who presented 3/12 with AMS, abdominal pain, constipation and volume depletion.  Found to have pancytopenia; seen by Hematology and initially felt to be related to mycophenolate/steroid therapy.  BMBx showed hypercellular marrow w/o blasts or malignant cells.  She developed worsening encephalopathy on 3/16 and Neuro was consulted.  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 ? ?On 3/17, patient had new onset seizure and required intubation for airway protection. PCCM consulted. She was transferred from [facility] to [facility] for LTM. Later felt to have [place] and started on dexamethasone and etoposide. Course complicated by Candidal fungemia likely from PICC line. ? ?Pertinent Medical History:  ?Rheumatoid arthritis, Dermatomyositis, Fatty liver, HLD, HTN, Depression, Anxiety ? ?Significant Hospital Events: ?Including procedures, antibiotic start and stop dates in addition to other pertinent events   ?3/17 intubation ?3/18 LP performed: no leukocytes, protein elevated, transfer to 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 ?3/26 Candida fungemia for which started on micafungin. Dex and Etoposide stopped. [place] discussion with family - palliative care consulted  ?3/28 Febrile overnight for which cefepime added. WBC dropped to 1.4; started on Granix inj. ?3/31 restart dexamethasone ?4/01 Bronch/BAL ?4/03 restart etoposide ?4/04 change to pressure control to improve vent  synchrony ?4/05 Better tolerating pressure control. Increased peripheral edema.  ? ?Studies:  ?03/2021 labs >> ANA positive, Jo-1 neg, centromere neg, ds DNA neg, RNP positive, SSA/SSB neg, RF positive, chromatin Ab neg, CCP neg ?MRI 3/13 >> no acute findings ?3/17 bone marrow biopsy >> hypercellular bone marrow with trilineage hematopoiesis, comment: non-specific, follow up cytogenetic studies ?3/12 CT abdomen and pelvis >> no acute findings, adrenal nodule, diverticulosis ?3/12 CT head without contrast >> no acute findings ?3/16 EEG >> moderate diffuse encephalopathy of nonspecific etiology, no seizures or epileptiform discharges noted during the recording ?3/19 Echo >> EF 60 to 65%, mild LVH, grade 1 DD, mild/mod TR ?3/29 CT abd >> b/l ASD, small GB stone, diffuse subcutaneous edema ?3/31 CT head >> mild cortical atrophy ? ?Micro:  ?3/16 parvoB19 IgG pos, IgM neg ?3/16 EBV IgG pos, IgM neg ?3/16 CMV IgG pos, IgM neg ?3/16 HHV6 IgG pos ?3/17 bone marrow biopsy > hypercellular with trilineage hematopoiesis ?3/23 blood culture 1/4 > Candida glabrata ?4/01 BAL > ?4/01 PCP > ?4/01 BAL AFB >  ?4/01 BAL Fungus >  ? ?Antibiotics:  ?Rocephin 3/12 ?Acyclovir 3/16>3/19  ?Unasyn 3/19 > 3/24 ?Micafungin 3/24 >  ?Cefepime 3/28 > 4/4 ? ?Interim History / Subjective:  ?No significant events overnight ?Improved vent synchrony in current mode ?Increased peripheral edema ?Net -258m/24H, -349.53madmission ?Decreased UOP with stable renal function ?Lasix BID today ? ?Objective:  ?Blood pressure 129/76, pulse 97, temperature 98.9 ?F (37.2 ?C), temperature source Axillary, resp. rate 17, height _0  (1.6 m), weight 68.7 kg, SpO2 98 %. ?   ?Vent Mode: PSV;CPAP ?FiO2 (%):  [40 %] 40 % ?Set Rate:  [18 bmp] 18 bmp ?PEEP:  [5 cmH20] 5 cmH20 ?Pressure Support:  [12 cmH20-15 cmH20] 12 cmH20 ?Plateau Pressure:  [18 cmELF81-01  cmH20] 18 cmH20  ? ?Intake/Output Summary (Last 24 hours) at 09/10/2021 0934 ?Last data filed at 09/10/2021 0800 ?Gross  per 24 hour  ?Intake 2160.85 ml  ?Output 2300 ml  ?Net -139.15 ml  ? ? ?Filed Weights  ? 08/29/21 0500 08/30/21 0500 09/09/21 0500  ?Weight: 69.2 kg 70 kg 68.7 kg  ? ?Physical Examination: ?General: Acute-on-chronically ill-appearing middle-aged woman in NAD. ?HEENT: [place]/AT, anicteric sclera, PERRL, moist mucous membranes. Large upper lip ulceration with dried blood present superior to ETT. ?Neuro: Sedated. Withdraws to pain in all 4 extremities. Not following commands. No spontaneous movement of extremities noted on exam. +Corneal, +Cough, and +Gag  ?CV: RRR, no m/g/r. ?PULM: Breathing even and unlabored on vent (12/5, FiO2 40%). Lung fields coarse throughout. ?GI: Soft, nontender, nondistended. Normoactive bowel sounds. ?Extremities: Bilateral symmetric 2+ LE edema noted. Bilateral symmetric 2-3+ UE edema noted, hands pitting. ?Skin: Warm/dry, scattered ecchymosis of BUE/BLE. ? ?Resolved Hospital Problem List:   ?Aspiration pneumonia, Septic shock, Shock liver, AKI from ATN in setting of sepsis ? ?Assessment & Plan:  ? ?Candidal fungemia in setting of chronic immunosuppression. ?HCAP. ?- ID consulted, appreciate recommendations ?- Cefepime discontinued 4/4 ?- Micafungin to continue x 2-week course (end 4/9) ?- F/u finalized Cx data 4/1 (BAL, AFB, fungal, PCP) ? ?Acute metabolic encephalopathy, uncertain etiology ?New onset seizure. ?Hx of anxiety and depression. ?Concern for AI process versus [place] with CNS involvement. ?- RASS goal 0 to -1 ?- Continue Abilify, Lexapro, Klonopin ?- Continue Keppra ? ?[place] ?- Hematology/Oncology consulted, appreciate assistance ?- Primary versus MAS in the setting of connective tissue disease ?- Lab markers (increased IL-2 receptor) ?- Continue dexamethasone (resumed 3/31) ?- Continue etoposide (resumed 4/3) ? ?Pancytopenia ?Multifactorial in the setting of chemotherapy and [place]. ?- Trend CBC with differential ?- Transfuse for Hgb < 7.0, Plt < 20K ? ?Acute hypoxic respiratory failure  from HCAP ?- Continue pressure control ventilatory support, tolerating better ?- Wean FiO2 for O2 sat > 90% ?- Daily WUA/SBT; mental status precludes extubation trial at this time ?- VAP bundle ?- Pulmonary hygiene ?- PAD protocol for sedation: Fentanyl for goal RASS 0 to -1 ?- Intermittent CXR ?- Eventual GOC conversation re: tracheostomy/further aggressive measures if these are patient's/family's wishes ? ?Steroid-induced hyperglycemia ?- Semglee ?- TF coverage + SSI ? ?Hx of dermatomyositis and rheumatoid arthritis ?- Continue dexamethasone ? ?Hx of HTN ?Hypervolemia ?- Continue Lopressor ?- Lasix 64m BID x 2 doses 4/5 ?- Monitor I&Os ? ?Pressure injury ?Stage 3 R hand - POA, stage 3 sacrum - POA, stage 2 Lt buttock, not present on admission. ?- WOC, appreciate assistance ? ?Goals of care ?- Palliative care consulted, appreciate assistance with management ?- Last [place], family would like to continue with full scope of care ?- Upcoming discussion re: possible tracheostomy; will reach out to family 4/5 ? ?Best Practice (right click and "Reselect all SmartList Selections" daily)  ? ?Diet/type: tubefeeds ?DVT prophylaxis: SCD ?GI prophylaxis: PPI ?Lines: see above ?Foley:  N/A and removal ordered  ?Code Status:  full code ?Last date of multidisciplinary goals of care discussion: Reached out to family 4/5 ? ?Critical care time: 46 minutes  ? ?SLestine Mount PA-C ?[facility] ?09/10/21 9:35 AM ? ?Please see [email or web address] for pager details. ? ?From 7A-7P if no response, please call [phone] ?After hours, please call ELink [phone]?

## 2021-09-11 ENCOUNTER — Inpatient Hospital Stay (HOSPITAL_COMMUNITY): Payer: Federal, State, Local not specified - PPO

## 2021-09-11 DIAGNOSIS — B377 Candidal sepsis: Secondary | ICD-10-CM | POA: Diagnosis not present

## 2021-09-11 DIAGNOSIS — D761 Hemophagocytic lymphohistiocytosis: Secondary | ICD-10-CM | POA: Diagnosis not present

## 2021-09-11 DIAGNOSIS — J9601 Acute respiratory failure with hypoxia: Secondary | ICD-10-CM | POA: Diagnosis not present

## 2021-09-11 DIAGNOSIS — G9340 Encephalopathy, unspecified: Secondary | ICD-10-CM | POA: Diagnosis not present

## 2021-09-11 LAB — ACID FAST SMEAR (AFB, MYCOBACTERIA): Acid Fast Smear: NEGATIVE

## 2021-09-11 LAB — CBC WITH DIFFERENTIAL/PLATELET
Abs Immature Granulocytes: 0 10*3/uL (ref 0.00–0.07)
Basophils Absolute: 0 10*3/uL (ref 0.0–0.1)
Basophils Relative: 0 %
Eosinophils Absolute: 0 10*3/uL (ref 0.0–0.5)
Eosinophils Relative: 0 %
HCT: 23.9 % — ABNORMAL LOW (ref 36.0–46.0)
Hemoglobin: 7.7 g/dL — ABNORMAL LOW (ref 12.0–15.0)
Immature Granulocytes: 0 %
Lymphocytes Relative: 67 %
Lymphs Abs: 0.2 10*3/uL — ABNORMAL LOW (ref 0.7–4.0)
MCH: 28.3 pg (ref 26.0–34.0)
MCHC: 32.2 g/dL (ref 30.0–36.0)
MCV: 87.9 fL (ref 80.0–100.0)
Monocytes Absolute: 0 10*3/uL — ABNORMAL LOW (ref 0.1–1.0)
Monocytes Relative: 4 %
Neutro Abs: 0.1 10*3/uL — CL (ref 1.7–7.7)
Neutrophils Relative %: 29 %
Platelets: 28 10*3/uL — CL (ref 150–400)
RBC: 2.72 MIL/uL — ABNORMAL LOW (ref 3.87–5.11)
RDW: 19.8 % — ABNORMAL HIGH (ref 11.5–15.5)
WBC: 0.3 10*3/uL — CL (ref 4.0–10.5)
nRBC: 0 % (ref 0.0–0.2)

## 2021-09-11 LAB — COMPREHENSIVE METABOLIC PANEL
ALT: 116 U/L — ABNORMAL HIGH (ref 0–44)
AST: 74 U/L — ABNORMAL HIGH (ref 15–41)
Albumin: 1.6 g/dL — ABNORMAL LOW (ref 3.5–5.0)
Alkaline Phosphatase: 309 U/L — ABNORMAL HIGH (ref 38–126)
Anion gap: 6 (ref 5–15)
BUN: 121 mg/dL — ABNORMAL HIGH (ref 8–23)
CO2: 26 mmol/L (ref 22–32)
Calcium: 9.3 mg/dL (ref 8.9–10.3)
Chloride: 115 mmol/L — ABNORMAL HIGH (ref 98–111)
Creatinine, Ser: 0.98 mg/dL (ref 0.44–1.00)
GFR, Estimated: >60 mL/min (ref >60)
Glucose, Bld: 225 mg/dL — ABNORMAL HIGH (ref 70–99)
Potassium: 4.2 mmol/L (ref 3.5–5.1)
Sodium: 147 mmol/L — ABNORMAL HIGH (ref 135–145)
Total Bilirubin: 0.7 mg/dL (ref 0.3–1.2)
Total Protein: 5.4 g/dL — ABNORMAL LOW (ref 6.5–8.1)

## 2021-09-11 LAB — GLUCOSE, CAPILLARY
Glucose-Capillary: 224 mg/dL — ABNORMAL HIGH (ref 70–99)
Glucose-Capillary: 214 mg/dL — ABNORMAL HIGH (ref 70–99)
Glucose-Capillary: 183 mg/dL — ABNORMAL HIGH (ref 70–99)
Glucose-Capillary: 212 mg/dL — ABNORMAL HIGH (ref 70–99)
Glucose-Capillary: 191 mg/dL — ABNORMAL HIGH (ref 70–99)
Glucose-Capillary: 142 mg/dL — ABNORMAL HIGH (ref 70–99)

## 2021-09-11 LAB — PHOSPHORUS: Phosphorus: 1.8 mg/dL — ABNORMAL LOW (ref 2.5–4.6)

## 2021-09-11 LAB — MAGNESIUM: Magnesium: 1.8 mg/dL (ref 1.7–2.4)

## 2021-09-11 IMAGING — CT CT HEAD W/O CM
4 series · 16 of 47 positions shown, 18 images · non-contrast
Comparison: [DATE].

CLINICAL DATA: Altered mental status.



[Series 3: head without · axial · non-contrast · 0.46mm/px · z∈[-71,+49]mm · 7 of 33 slices shown, 9 images]
[im 5/33  brain]
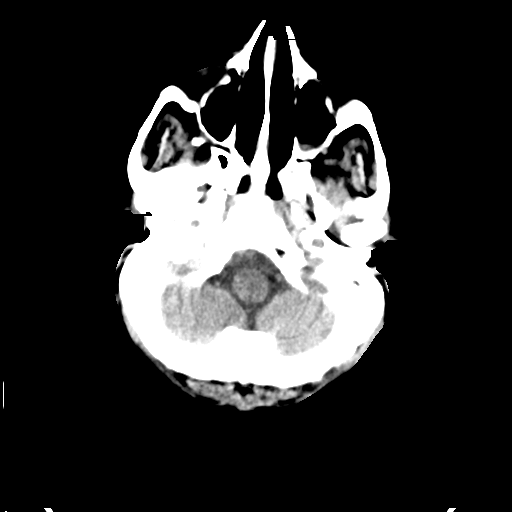
[im 5/33  bone]
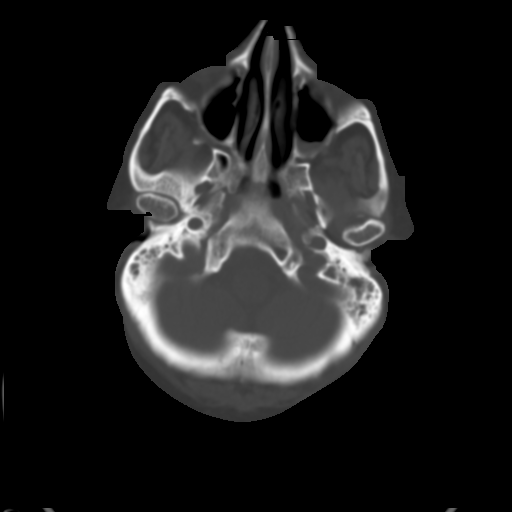
[im 9/33  brain]
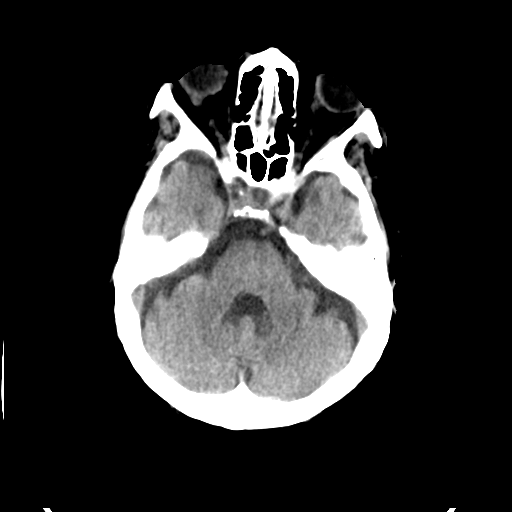
[im 13/33  brain]
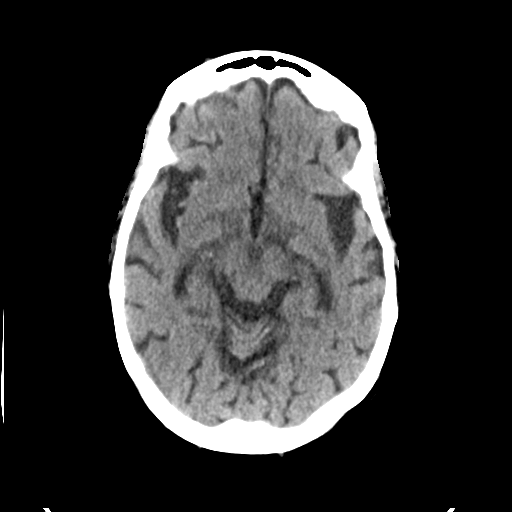
[im 17/33  brain]
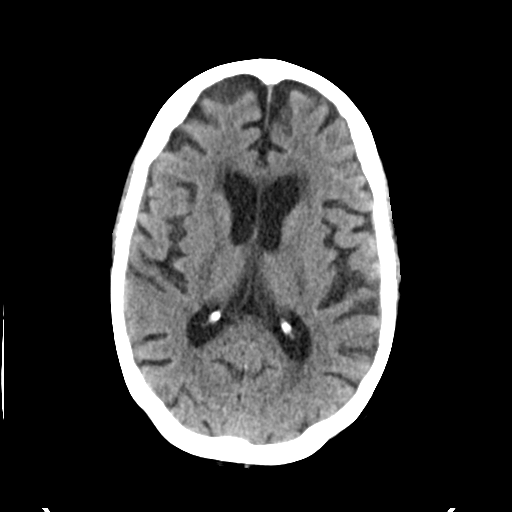
[im 21/33  brain]
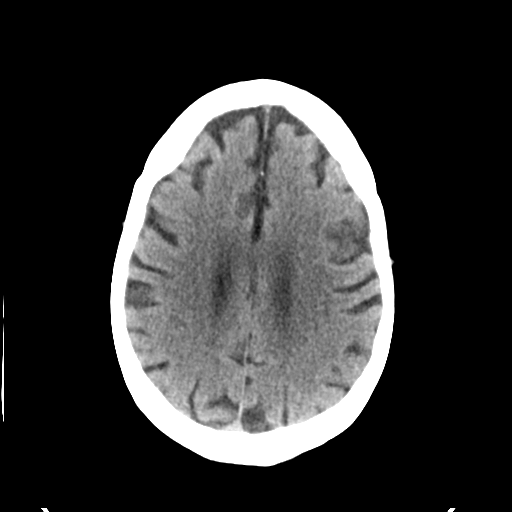
[im 21/33  bone]
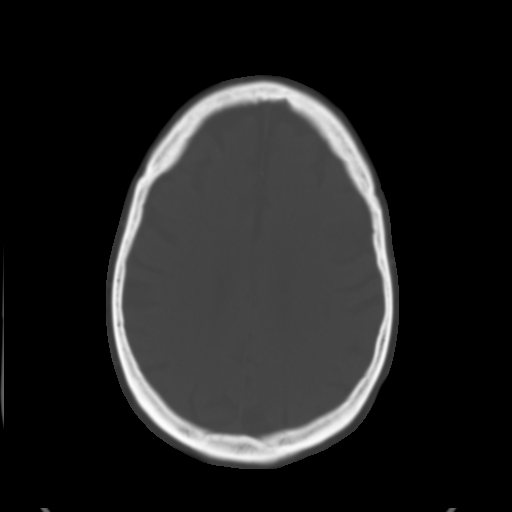
[im 25/33  brain]
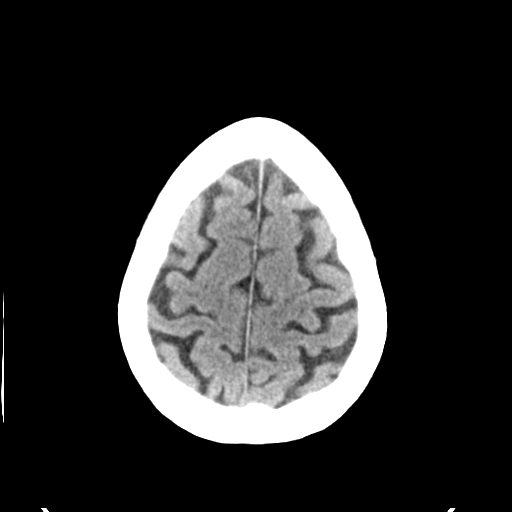
[im 29/33  brain]
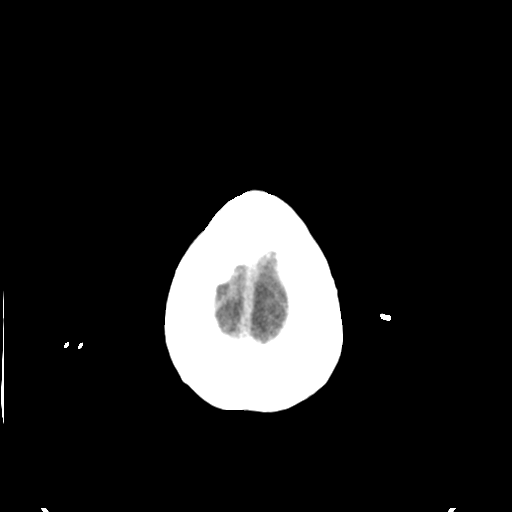

[Series 4: head bone · axial · 0.46mm/px · z∈[-75,-43]mm · 3 of 82 slices shown]
[im 9/82  bone]
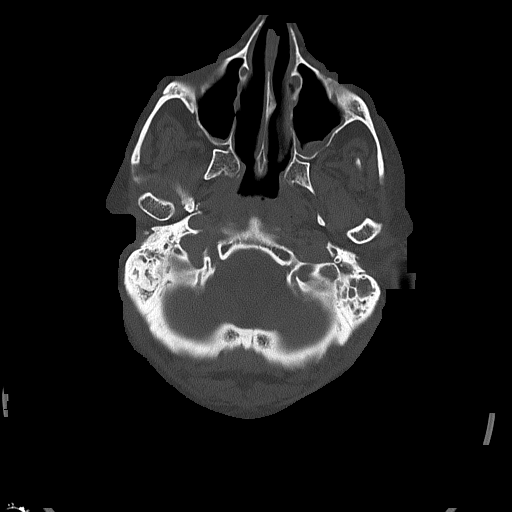
[im 17/82  bone]
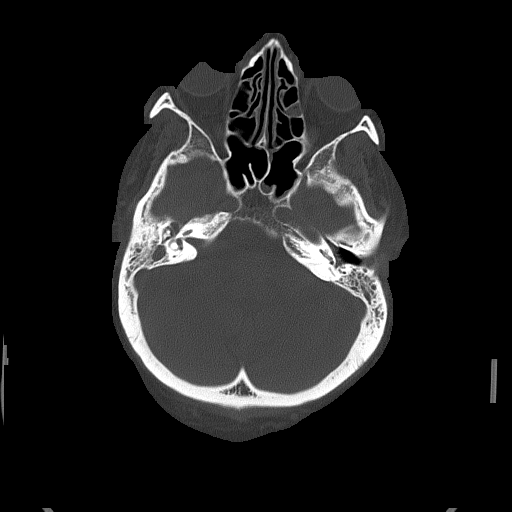
[im 25/82  bone]
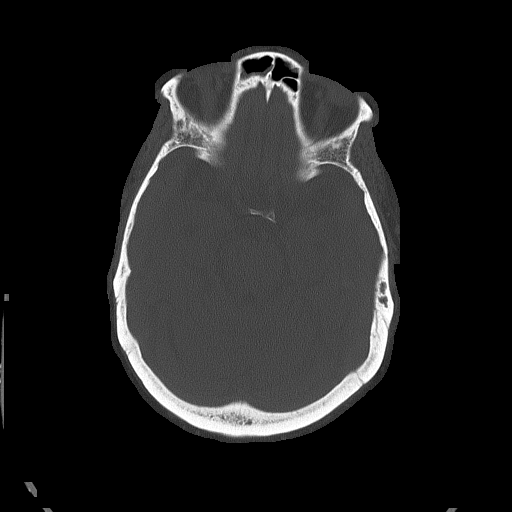

[Series 5: head without cor · coronal · non-contrast · 0.34mm/px · 3 of 69 slices shown]
[im 23/69  brain]
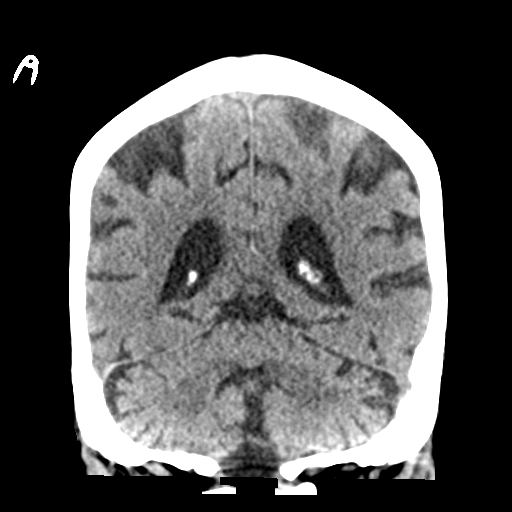
[im 31/69  brain]
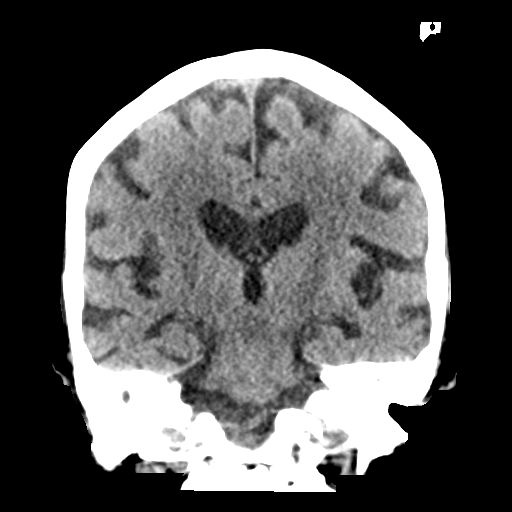
[im 38/69  brain]
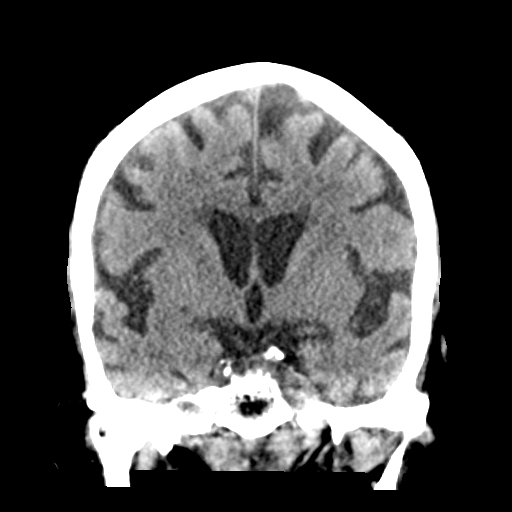

[Series 6: head without sag · sagittal · non-contrast · 0.34mm/px · 3 of 51 slices shown]
[im 17/51  brain]
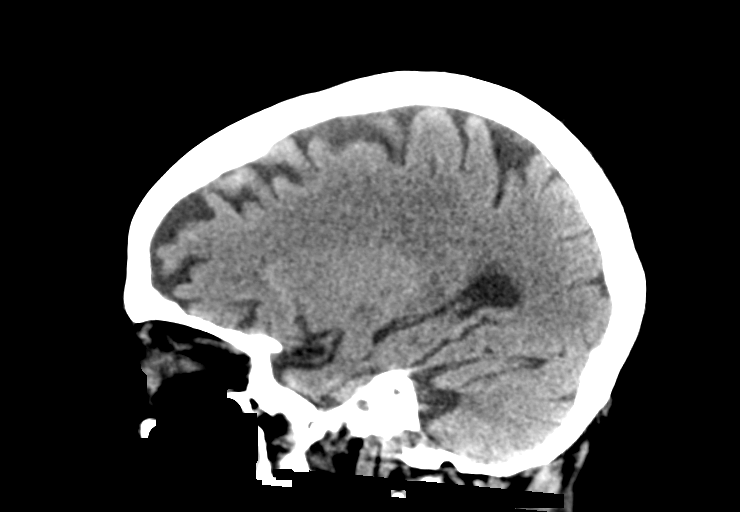
[im 26/51  brain]
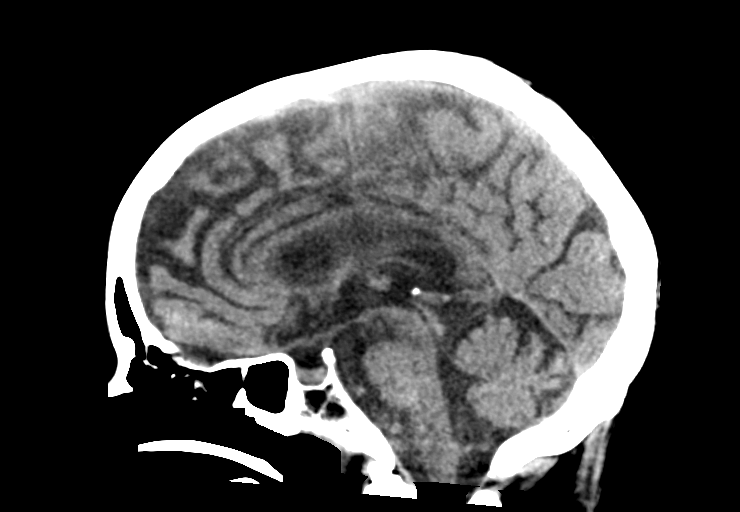
[im 34/51  brain]
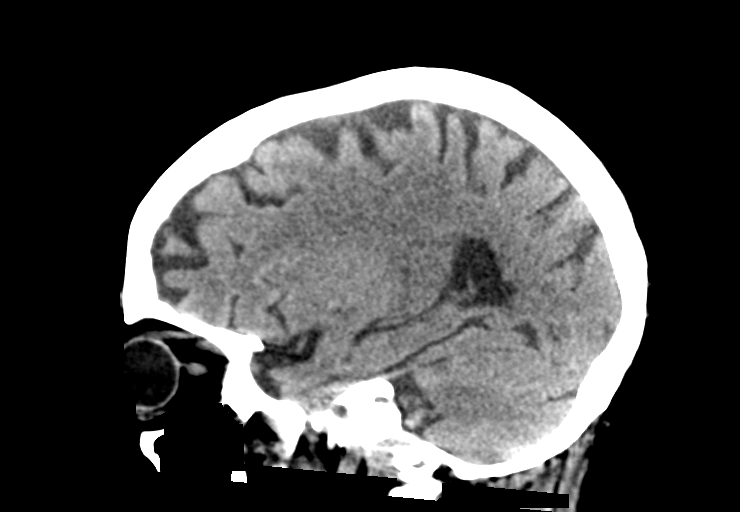

[16 of 47 positions shown; findings below may reference images not displayed]

FINDINGS: Brain: Mild chronic ischemic white matter disease is noted. No mass
effect or midline shift is noted. Ventricular size is within normal
limits. There is no evidence of mass lesion, hemorrhage or acute
infarction.

Vascular: No hyperdense vessel or unexpected calcification.

Skull: Normal. Negative for fracture or focal lesion.

Sinuses/Orbits: Mild bilateral maxillary mucosal thickening is
noted.

Other: None.
IMPRESSION: No acute intracranial abnormality seen.

## 2021-09-11 MED ORDER — SODIUM PHOSPHATES 45 MMOLE/15ML IV SOLN
30.0000 mmol | Freq: Once | INTRAVENOUS | Status: AC
  Administered 2021-09-11: 30 mmol via INTRAVENOUS
  Filled 2021-09-11: qty 10

## 2021-09-11 MED ORDER — FUROSEMIDE 10 MG/ML IJ SOLN
40.0000 mg | Freq: Two times a day (BID) | INTRAMUSCULAR | Status: AC
  Administered 2021-09-11 (×2): 40 mg via INTRAVENOUS
  Filled 2021-09-11 (×2): qty 4

## 2021-09-11 MED ORDER — INSULIN ASPART 100 UNIT/ML IJ SOLN
4.0000 [IU] | INTRAMUSCULAR | Status: DC
  Administered 2021-09-11 – 2021-09-14 (×18): 4 [IU] via SUBCUTANEOUS

## 2021-09-11 NOTE — Progress Notes (Signed)
Inpatient Diabetes Program Recommendations ? ?AACE/ADA: New Consensus Statement on Inpatient Glycemic Control (2015) ? ?Target Ranges:  Prepandial:   less than 140 mg/dL ?     Peak postprandial:   less than 180 mg/dL (1-2 hours) ?     Critically ill patients:  140 - 180 mg/dL  ? ?Lab Results  ?Component Value Date  ? GLUCAP 214 (H) 09/11/2021  ? HGBA1C 6.2 (H) Feb 19, 2022  ? ? ?Review of Glycemic Control ? Latest Reference Range & Units 09/10/21 15:51 09/10/21 20:28 09/10/21 23:03 09/11/21 03:15 09/11/21 08:26  ?Glucose-Capillary 70 - 99 mg/dL 161264 (H) 096186 (H) 045210 (H) 224 (H) 214 (H)  ?(H): Data is abnormally high ? ?Current orders for Inpatient glycemic control: Semglee 20 units QAM, Novolog 0-15 units Q4H, Novolog 4 units Q4H, Decadron 20 mg QD, Vital @ 60 ml/hr ?  ?Inpatient Diabetes Program Recommendations:   ?  ?Tube feeds at goal of 60 ml/hr.  Please consider increasing Novolog tube feed coverage to 6 units Q4H. ?  ?Will continue to follow while inpatient. ?  ?Thank you, ?Dulce SellarJen Thaison Kolodziejski, MSN, RN ?Diabetes Coordinator ?Inpatient Diabetes Program ?[phone] (team pager from 8a-5p) ? ? ? ? ?

## 2021-09-11 NOTE — Plan of Care (Signed)
  Problem: Nutrition: Goal: Adequate nutrition will be maintained Outcome: Progressing   Problem: Education: Goal: Knowledge of General Education information will improve Description: Including pain rating scale, medication(s)/side effects and non-pharmacologic comfort measures Outcome: Not Progressing   Problem: Health Behavior/Discharge Planning: Goal: Ability to manage health-related needs will improve Outcome: Not Progressing   

## 2021-09-11 NOTE — Progress Notes (Signed)
Pt. Was transported to CT and back to RM # [id number] without complications. ?

## 2021-09-11 NOTE — Progress Notes (Addendum)
HEMATOLOGY-ONCOLOGY PROGRESS NOTE ? ?ASSESSMENT AND PLAN: ?61 yo female  ?  ?Acute encephalopathy, ? 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  ?Recommendations: ?-Labs from today have been reviewed.  Her WBC is 0.3, hemoglobin 7.7, platelets 28,000, ANC 0.1.  Pancytopenia due to recent chemotherapy and [place].. ?-Received Granix on 4/4 and 4/5.  Will hold today in anticipation of chemotherapy tomorrow.  Thereafter, will restart Granix the day following chemotherapy. ?-We will continue dexamethasone 20 mg IV daily.  Etoposide will be due again on 09/12/2021. ?-Transfuse PRBCs per ICU protocol.  Transfuse platelets for platelet count less than 20,000 or active bleeding. ?-Continue to monitor daily CBC with differential. ?-We had previously reviewed with the patient's family that she is critically ill and the possibility of death is very high even with treatment for [place].  Last met with palliative care on 09/06/2021 and family wishes to continue full code/full scope of treatment.  Palliative care following peripherally at this point as goals are clear. ? ?Mikey Bussing, DNP, AGPCNP-BC, AOCNP ? ?SUBJECTIVE: Intubated, sedated.  No family at bedside.  Had temp up to 100.8 last evening.  No bleeding.  Discussed with nursing patient noted to have a left gaze preference earlier today.  CT of the head was performed which showed no acute intracranial abnormality.  EEG currently in progress. ? ?REVIEW OF SYSTEMS:   ?Review of Systems  ?Reason unable to perform ROS: Sedated, on ventilator.  ? ?PHYSICAL EXAMINATION: ?ECOG PERFORMANCE STATUS: 4 - Bedbound ? ?Vitals:  ?  09/11/21 1500 09/11/21 1600  ?BP: 116/64 (!) 152/84  ?Pulse: 88 98  ?Resp: 15 20  ?Temp:    ?SpO2: 100% 99%  ? ?Filed Weights  ? 08/29/21 0500 08/30/21 0500 09/09/21 0500  ?Weight: 69.2 kg 70 kg 68.7 kg  ? ? ?Intake/Output from previous day: ?04/05 0701 - 04/06 0700 ?In: 2606.9 [I.V.:294.9; ZO/XW:9604; IV Piggyback:158] ?Out: 3100 [Urine:3100] ? ?Physical Exam ?Vitals reviewed.  ?Constitutional:   ?   Comments: On ventilator.  Eyes open but does not respond to me.  Generalized edema noted.  ?HENT:  ?   Head: Normocephalic.  ?Musculoskeletal:  ?   Right lower leg: Edema present.  ?   Left lower leg: Edema present.  ?Neurological:  ?   Comments: Sedated.  ? ? ?LABORATORY DATA:  ?I have reviewed the data as listed ? ?  Latest Ref Rng & Units 09/11/2021  ?  6:13 AM 09/10/2021  ?  4:06 AM 09/09/2021  ?  3:28 AM  ?CMP  ?Glucose 70 - 99 mg/dL 225   230   171    ?BUN 8 - 23 mg/dL 121   118   107    ?Creatinine 0.44 - 1.00 mg/dL 0.98   0.96   1.01    ?Sodium 135 - 145 mmol/L 147   147   147    ?Potassium 3.5 - 5.1 mmol/L 4.2   4.6   4.2    ?Chloride 98 - 111 mmol/L 115   114   113    ?CO2 22 - 32 mmol/L $RemoveB'26   26   27    'mtvkwjuK$ ?Calcium 8.9 - 10.3 mg/dL 9.3   9.4   9.1    ?Total Protein 6.5 -  8.1 g/dL 5.4      ?Total Bilirubin 0.3 - 1.2 mg/dL 0.7      ?Alkaline Phos 38 - 126 U/L 309      ?AST 15 - 41 U/L 74      ?ALT 0 - 44 U/L 116      ? ? ?Lab Results  ?Component Value Date  ? WBC 0.3 (LL) 09/11/2021  ? HGB 7.7 (L) 09/11/2021  ? HCT 23.9 (L) 09/11/2021  ? MCV 87.9 09/11/2021  ? PLT 28 (LL) 09/11/2021  ? NEUTROABS 0.1 (LL) 09/11/2021  ? ? ?Lab Results  ?Component Value Date  ? CEA1 36.4 (H) 08/28/2021  ? ? ?CT HEAD WO CONTRAST (5MM) ? ?Result Date: 09/11/2021 ?CLINICAL DATA:  Altered mental status. EXAM: CT HEAD WITHOUT CONTRAST TECHNIQUE: Contiguous axial images were obtained from the base of the skull through the vertex without intravenous contrast. RADIATION DOSE REDUCTION: This exam was performed according to the departmental  dose-optimization program which includes automated exposure control, adjustment of the mA and/or kV according to patient size and/or use of iterative reconstruction technique. COMPARISON:  September 05, 2021. FINDINGS: Brain: Mild chronic ischemic white matter disease is noted. No mass effect or midline shift is noted. Ventricular size is within normal limits. There is no evidence of mass lesion, hemorrhage or acute infarction. Vascular: No hyperdense vessel or unexpected calcification. Skull: Normal. Negative for fracture or focal lesion. Sinuses/Orbits: Mild bilateral maxillary mucosal thickening is noted. Other: None. IMPRESSION: No acute intracranial abnormality seen. Electronically Signed   By: Marijo Conception M.D.   On: 09/11/2021 11:31  ? ?CT HEAD WO CONTRAST (5MM) ? ?Result Date: 09/05/2021 ?CLINICAL DATA:  Altered mental status. EXAM: CT HEAD WITHOUT CONTRAST TECHNIQUE: Contiguous axial images were obtained from the base of the skull through the vertex without intravenous contrast. RADIATION DOSE REDUCTION: This exam was performed according to the departmental dose-optimization program which includes automated exposure control, adjustment of the mA and/or kV according to patient size and/or use of iterative reconstruction technique. COMPARISON:  August 17, 2021. FINDINGS: Brain: Mild diffuse cortical atrophy is noted. No mass effect or midline shift is noted. Ventricular size is within normal limits. There is no evidence of mass lesion, hemorrhage or acute infarction. Vascular: No hyperdense vessel or unexpected calcification. Skull: Normal. Negative for fracture or focal lesion. Sinuses/Orbits: Left frontal, ethmoid and maxillary sinusitis is noted. Other: Fluid is noted in the mastoid air cells bilaterally. IMPRESSION: No acute intracranial abnormality seen. Electronically Signed   By: Marijo Conception M.D.   On: 09/05/2021 14:37  ? ?CT HEAD WO CONTRAST (5MM) ? ?Result Date: 09/02/2021 ?CLINICAL DATA:   61 year old female with history of delirium. Lower abdominal pain and constipation. EXAM: CT HEAD WITHOUT CONTRAST TECHNIQUE: Contiguous axial images were obtained from the base of the skull through the vertex without intravenous contrast. RADIATION DOSE REDUCTION: This exam was performed according to the departmental dose-optimization program which includes automated exposure control, adjustment of the mA and/or kV according to patient size and/or use of iterative reconstruction technique. COMPARISON:  Head CT 08/06/2021. FINDINGS: Brain: Mild cerebral atrophy. Patchy and confluent areas of decreased attenuation are noted throughout the deep and periventricular white matter of the cerebral hemispheres bilaterally, compatible with chronic microvascular ischemic disease. No evidence of acute infarction, hemorrhage, hydrocephalus, extra-axial collection or mass lesion/mass effect. Vascular: No hyperdense vessel or unexpected calcification. Skull: Normal. Negative for fracture or focal lesion. Sinuses/Orbits: No acute finding. Other: None. IMPRESSION: 1. No acute intracranial  abnormalities. 2. Mild cerebral atrophy with chronic microvascular ischemic changes in the cerebral white matter, as above. Electronically Signed   By: Vinnie Langton M.D.   On: 08/28/2021 06:31  ? ?CT ABDOMEN W CONTRAST ? ?Result Date: 09/03/2021 ?CLINICAL DATA:  Candidiasis.  Evaluate for intra-abdominal abscess. EXAM: CT ABDOMEN WITH CONTRAST TECHNIQUE: Multidetector CT imaging of the abdomen was performed using the standard protocol following bolus administration of intravenous contrast. RADIATION DOSE REDUCTION: This exam was performed according to the departmental dose-optimization program which includes automated exposure control, adjustment of the mA and/or kV according to patient size and/or use of iterative reconstruction technique. CONTRAST:  150mL OMNIPAQUE IOHEXOL 300 MG/ML  SOLN COMPARISON:  08/16/2021 FINDINGS: Lower chest:  Significant interval progression of bilateral interstitial and airspace opacities within both lungs. Hepatobiliary: No focal liver abnormality. Small stone within the gallbladder measures 3 mm. No signs of gallbladder wall thickeni

## 2021-09-11 NOTE — Procedures (Signed)
Patient Name: Carrie [name]  ?MRN: [id number]  ?Epilepsy Attending: Charlsie Quest  ?Referring Physician/Provider: Tim Lair, PA-C ?Date: 09/11/2021 ?Duration: 21.45 mins ? ?Patient history: 61 year old female with transient left gaze preference.  EEG evaluate for seizure. ? ?Level of alertness:  comatose ? ?AEDs during EEG study: LEV ? ?Technical aspects: This EEG study was done with scalp electrodes positioned according to the 10-20 International system of electrode placement. Electrical activity was acquired at a sampling rate of 500Hz  and reviewed with a high frequency filter of 70Hz  and a low frequency filter of 1Hz . EEG data were recorded continuously and digitally stored.  ? ?Description: EEG showed continuous generalized low amplitude 2 to 3 Hz delta slowing admixed with intermittent generalized 15 to 18 Hz beta activity.  Patient was noted to have left gaze deviation during the study.  Concomitant EEG before, during and after the event did not show any EEG changes suggest seizure.  Hyperventilation and photic stimulation were not performed.    ? ?ABNORMALITY ?- Continuous slow, generalized ? ?IMPRESSION: ?This study is suggestive of severe diffuse encephalopathy, nonspecific etiology. No seizures or epileptiform discharges were seen throughout the recording. ? ?Patient was noted to have left gaze deviation during the study without concomitant EEG change. This was most likely not an epileptic event.  However, focal motor seizure may not be seen on scalp EEG.  Clinical correlation is recommended. ? ?Charlsie Quest  ? ?

## 2021-09-11 NOTE — Progress Notes (Signed)
EEG complete - results pending 

## 2021-09-11 NOTE — Progress Notes (Signed)
CCM PA at bedside performing assessment. Pt. Noted to have new left gaze preference. Per CCM PA she will discuss with Dr. Halford Chessman regarding need for further imaging or orders. ?

## 2021-09-11 NOTE — Progress Notes (Signed)
? ?NAME:  BEMNET TROVATO, MRN:  [id number], DOB:  Mar 31, 1961, LOS: 24 ?ADMISSION DATE:  08/09/2021, CONSULTATION DATE:  08/22/2021 ?REFERRING MD:  Darrick Meigs, CHIEF COMPLAINT:  Confusion  ? ?History of Present Illness:  ?61 year old woman with history of dermatomyositis and rheumatoid arthritis (on immunosuppressive therapy) who presented 3/12 with AMS, abdominal pain, constipation and volume depletion.  Found to have pancytopenia; seen by Hematology and initially felt to be related to mycophenolate/steroid therapy.  BMBx showed hypercellular marrow w/o blasts or malignant cells.  She developed worsening encephalopathy on 3/16 and Neuro was consulted.  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 ? ?On 3/17, patient had new onset seizure and required intubation for airway protection. PCCM consulted. She was transferred from [facility] to [facility] for LTM. Later felt to have [place] and started on dexamethasone and etoposide. Course complicated by Candidal fungemia likely from PICC line. ? ?Pertinent Medical History:  ?Rheumatoid arthritis, Dermatomyositis, Fatty liver, HLD, HTN, Depression, Anxiety ? 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 ?3/26 Candida fungemia for which started on micafungin. Dex and Etoposide stopped. [place] discussion with family - palliative care consulted  ?3/28 Febrile overnight for which cefepime added. WBC dropped to 1.4; started on Granix inj. ?3/31 restart dexamethasone ?4/01 Bronch/BAL ?4/03 restart etoposide ?4/04 change to pressure control to improve vent  synchrony ?4/05 Better tolerating pressure control. Increased peripheral edema. ?4/06 Continues to tolerate pressure control. ANC dropping, 100 today. Placed on Neutropenic Precautions. Ongoing peripheral edema. Nearing tracheostomy discussion. ? ?Studies:  ?03/2021 labs >> ANA positive, Jo-1 neg, centromere neg, ds DNA neg, RNP positive, SSA/SSB neg, RF positive, chromatin Ab neg, CCP neg ?MRI 3/13 >> no acute findings ?3/17 bone marrow biopsy >> hypercellular bone marrow with trilineage hematopoiesis, comment: non-specific, follow up cytogenetic studies ?3/12 CT abdomen and pelvis >> no acute findings, adrenal nodule, diverticulosis ?3/12 CT head without contrast >> no acute findings ?3/16 EEG >> moderate diffuse encephalopathy of nonspecific etiology, no seizures or epileptiform discharges noted during the recording ?3/19 Echo >> EF 60 to 65%, mild LVH, grade 1 DD, mild/mod TR ?3/29 CT abd >> b/l ASD, small GB stone, diffuse subcutaneous edema ?3/31 CT head >> mild cortical atrophy ? ?Micro:  ?3/16 ParvoB19 IgG pos, IgM neg ?3/16 EBV IgG pos, IgM neg ?3/16 CMV IgG pos, IgM neg ?3/16 HHV6 IgG pos ?3/17 Bone marrow biopsy > hypercellular with trilineage hematopoiesis ?3/23 Blood culture 1/4 > Candida glabrata ?4/01 BAL > ?4/01 PCP > ?4/01 BAL AFB >  ?4/01 BAL Fungus >  ? ?Antibiotics:  ?Rocephin 3/12 ?Acyclovir 3/16>3/19  ?Unasyn 3/19 > 3/24 ?Micafungin 3/24 >  ?Cefepime 3/28 > 4/4 ? ?Interim History / Subjective:  ?No significant events overnight ?Tolerated pressure control all day 4/5 ?Back to PRVC ~1800 to rest ?Tolerating pressure control again 4/6AM ?WBC continues to downtrend ?ANC 100 today, neutropenic precautions ?Mild transaminitis ?Phos repleted ? ?Objective:  ?Blood pressure (!) 146/82, pulse (!) 107, temperature 99.4 ?F (37.4 ?C), temperature source Axillary, resp. rate 18, height _0  (1.6 m), weight 68.7 kg, SpO2 100 %. ?   ?Vent Mode: PCV ?FiO2 (%):  [  40 %] 40 % ?Set Rate:  [18 bmp] 18 bmp ?PEEP:   [5 cmH20] 5 cmH20 ?Pressure Support:  [12 cmH20] 12 cmH20 ?Plateau Pressure:  [17 cmH20-20 cmH20] 20 cmH20  ? ?Intake/Output Summary (Last 24 hours) at 09/11/2021 0720 ?Last data filed at 09/11/2021 0600 ?Gross per 24 hour  ?Intake 2606.88 ml  ?Output 3100 ml  ?Net -493.12 ml  ? ? ?Filed Weights  ? 08/29/21 0500 08/30/21 0500 09/09/21 0500  ?Weight: 69.2 kg 70 kg 68.7 kg  ? ?Physical Examination: ?General: Acutely ill-appearing middle-aged woman in NAD. ?HEENT: [place]/AT, anicteric sclera, PERRL, leftward gaze preference, moist mucous membranes. Improved appearance of upper lip ulceration. ETT in place. ?Neuro: Sedated. Does not respond to verbal, tactile or noxious stimuli. Not following commands. No spontaneous movement of extremities noted. +Corneal, +Cough, and +Gag  ?CV: RRR, no m/g/r. ?PULM: Breathing even and mildly labored on vent (pressure control, 12/5). Lung fields coarse throughout, slightly worse than prior exam. ?GI: Soft, nontender, nondistended. Normoactive bowel sounds. ?Extremities: Bilateral symmetric 2+ LE edema noted, bilateral symmetric 3+ UE edema, pitting to hands. ?Skin: Warm/dry, scattered ecchymosis. ? ?Resolved Hospital Problem List:   ?Aspiration pneumonia, Septic shock, Shock liver, AKI from ATN in setting of sepsis ? ?Assessment & Plan:  ? ?Candidal fungemia in setting of chronic immunosuppression ?HCAP ?- ID consulted, appreciate recommendations ?- Cefepime discontinued 4/4 ?- Micafungin x 2-week course (end 4/9) ?- F/u finalized Cx data (4/1, BAL/AFB/fungal/PCP) ? ?Acute metabolic encephalopathy, uncertain etiology ?New onset seizure. ?Hx of anxiety and depression. ?Concern for AI process versus [place] with CNS involvement. ?- PASS goal 0 to -1 ?- Continue Abilify, Lexapro, Klonopin ?- Continue Keppra ?- CT Head given new leftward gaze deviation ?- Consider EEG if CT Head negative ? ?[place] ?- Hematology/Oncology consulted, following ?- Primary versus MAS in the setting of connective tissue  disease ?- Lab markers (increased IL-2 receptor) ?- Continue dexamethasone (resumed 3/31) ?- Etoposide per Onc (resumed 4/3) ? ?Pancytopenia ?Neutropenia ?Multifactorial in the setting of chemotherapy and [place]. ?- Trend CBC with diff ?- Monitor ANC ?- Neutropenic precautions ?- Transfuse for Hgb < 7.0, Plt < 20 ? ?Acute hypoxic respiratory failure from HCAP ?- Continue pressure control ventilatory support ?- Wean FiO2 for O2 sat > 90% ?- Daily WUA/SBT, mental status precludes extubation trial at this time ?- VAP bundle ?- Pulmonary hygiene ?- PAD protocol for sedation: Fentanyl for goal RASS 0 to -1 ?- Intermittent CXR ?- Needs GOC discussion re: tracheostomy (intubated since 3/17) and further aggressive measures ? ?Steroid-induced hyperglycemia ?- Semglee ?- TF coverage + SSI ? ?Hx of dermatomyositis and rheumatoid arthritis ?- Continue dexamethasone ? ?Hx of HTN ?Hypervolemia ?- Continue Lopressor ?- Lasix 52m BID x 2 doses 4/6 ?- Monitor I&Os ? ?Pressure injury ?Stage 3 R hand - POA, stage 3 sacrum - POA, stage 2 Lt buttock, not present on admission. ? - WOC, appreciate recs ? ?Goals of care ?- Palliative care consulted, appreciate assistance with management ?- Last GDearingconversation, family would like to continue with full scope of care ?- Upcoming discussion re: possible tracheostomy; will reach out to family 4/5 ? ?Best Practice (right click and "Reselect all SmartList Selections" daily)  ? ?Diet/type: tubefeeds ?DVT prophylaxis: SCD ?GI prophylaxis: PPI ?Lines: see above ?Foley:  N/A and removal ordered  ?Code Status:  full code ?Last date of multidisciplinary goals of care discussion: Reached out to family 4/5 ? ?Critical care time: 42 minutes  ? ?SLestine Mount PA-C ?[facility] ?09/11/21 7:20 AM ? ?Please see [email or web address] for pager details. ? ?From 7A-7P if no response, please call [phone] ?After hours, please call ELink [phone] ?

## 2021-09-12 DIAGNOSIS — R4182 Altered mental status, unspecified: Secondary | ICD-10-CM | POA: Diagnosis not present

## 2021-09-12 DIAGNOSIS — R109 Unspecified abdominal pain: Secondary | ICD-10-CM | POA: Diagnosis not present

## 2021-09-12 DIAGNOSIS — G9340 Encephalopathy, unspecified: Secondary | ICD-10-CM | POA: Diagnosis not present

## 2021-09-12 DIAGNOSIS — E785 Hyperlipidemia, unspecified: Secondary | ICD-10-CM

## 2021-09-12 DIAGNOSIS — B377 Candidal sepsis: Secondary | ICD-10-CM | POA: Diagnosis not present

## 2021-09-12 LAB — BASIC METABOLIC PANEL
Anion gap: 7 (ref 5–15)
BUN: 121 mg/dL — ABNORMAL HIGH (ref 8–23)
CO2: 30 mmol/L (ref 22–32)
Calcium: 9.4 mg/dL (ref 8.9–10.3)
Chloride: 114 mmol/L — ABNORMAL HIGH (ref 98–111)
Creatinine, Ser: 0.97 mg/dL (ref 0.44–1.00)
GFR, Estimated: >60 mL/min (ref >60)
Glucose, Bld: 252 mg/dL — ABNORMAL HIGH (ref 70–99)
Potassium: 5 mmol/L (ref 3.5–5.1)
Sodium: 151 mmol/L — ABNORMAL HIGH (ref 135–145)

## 2021-09-12 LAB — CBC WITH DIFFERENTIAL/PLATELET
Abs Immature Granulocytes: 0 10*3/uL (ref 0.00–0.07)
Basophils Absolute: 0 10*3/uL (ref 0.0–0.1)
Basophils Relative: 0 %
Eosinophils Absolute: 0 10*3/uL (ref 0.0–0.5)
Eosinophils Relative: 0 %
HCT: 22.6 % — ABNORMAL LOW (ref 36.0–46.0)
Hemoglobin: 7.3 g/dL — ABNORMAL LOW (ref 12.0–15.0)
Immature Granulocytes: 0 %
Lymphocytes Relative: 71 %
Lymphs Abs: 0.2 10*3/uL — ABNORMAL LOW (ref 0.7–4.0)
MCH: 28.6 pg (ref 26.0–34.0)
MCHC: 32.3 g/dL (ref 30.0–36.0)
MCV: 88.6 fL (ref 80.0–100.0)
Monocytes Absolute: 0 10*3/uL — ABNORMAL LOW (ref 0.1–1.0)
Monocytes Relative: 4 %
Neutro Abs: 0.1 10*3/uL — CL (ref 1.7–7.7)
Neutrophils Relative %: 25 %
Platelets: 33 10*3/uL — ABNORMAL LOW (ref 150–400)
RBC: 2.55 MIL/uL — ABNORMAL LOW (ref 3.87–5.11)
RDW: 19.3 % — ABNORMAL HIGH (ref 11.5–15.5)
Smear Review: DECREASED
WBC: 0.2 10*3/uL — CL (ref 4.0–10.5)
nRBC: 0 % (ref 0.0–0.2)

## 2021-09-12 LAB — GLUCOSE, CAPILLARY
Glucose-Capillary: 194 mg/dL — ABNORMAL HIGH (ref 70–99)
Glucose-Capillary: 245 mg/dL — ABNORMAL HIGH (ref 70–99)
Glucose-Capillary: 225 mg/dL — ABNORMAL HIGH (ref 70–99)
Glucose-Capillary: 249 mg/dL — ABNORMAL HIGH (ref 70–99)
Glucose-Capillary: 250 mg/dL — ABNORMAL HIGH (ref 70–99)
Glucose-Capillary: 201 mg/dL — ABNORMAL HIGH (ref 70–99)

## 2021-09-12 LAB — MAGNESIUM: Magnesium: 1.9 mg/dL (ref 1.7–2.4)

## 2021-09-12 LAB — PHOSPHORUS: Phosphorus: 3.3 mg/dL (ref 2.5–4.6)

## 2021-09-12 MED ORDER — FREE WATER
200.0000 mL | Status: DC
  Administered 2021-09-12 – 2021-09-14 (×11): 200 mL

## 2021-09-12 MED ORDER — SODIUM CHLORIDE 0.9 % IV SOLN
150.0000 mg/m2 | Freq: Once | INTRAVENOUS | Status: AC
  Administered 2021-09-12: 260 mg via INTRAVENOUS
  Filled 2021-09-12: qty 13

## 2021-09-12 MED ORDER — SODIUM CHLORIDE 0.9 % IV SOLN
2.0000 g | Freq: Three times a day (TID) | INTRAVENOUS | Status: DC
  Administered 2021-09-12 – 2021-09-18 (×19): 2 g via INTRAVENOUS
  Filled 2021-09-12 (×19): qty 12.5

## 2021-09-12 MED ORDER — TBO-FILGRASTIM 480 MCG/0.8ML ~~LOC~~ SOSY
480.0000 ug | PREFILLED_SYRINGE | Freq: Every day | SUBCUTANEOUS | Status: AC
  Administered 2021-09-13 – 2021-09-14 (×2): 480 ug via SUBCUTANEOUS
  Filled 2021-09-12 (×2): qty 0.8

## 2021-09-12 MED ORDER — INSULIN ASPART 100 UNIT/ML IJ SOLN
0.0000 [IU] | INTRAMUSCULAR | Status: DC
  Administered 2021-09-12 (×3): 7 [IU] via SUBCUTANEOUS
  Administered 2021-09-13: 4 [IU] via SUBCUTANEOUS
  Administered 2021-09-13: 7 [IU] via SUBCUTANEOUS
  Administered 2021-09-13 (×2): 3 [IU] via SUBCUTANEOUS
  Administered 2021-09-14: 7 [IU] via SUBCUTANEOUS
  Administered 2021-09-14 (×3): 4 [IU] via SUBCUTANEOUS
  Administered 2021-09-14 (×2): 7 [IU] via SUBCUTANEOUS
  Administered 2021-09-15: 11 [IU] via SUBCUTANEOUS
  Administered 2021-09-15: 4 [IU] via SUBCUTANEOUS
  Administered 2021-09-15: 11 [IU] via SUBCUTANEOUS
  Administered 2021-09-15: 7 [IU] via SUBCUTANEOUS
  Administered 2021-09-15: 3 [IU] via SUBCUTANEOUS
  Administered 2021-09-15: 11 [IU] via SUBCUTANEOUS
  Administered 2021-09-16: 3 [IU] via SUBCUTANEOUS
  Administered 2021-09-16 – 2021-09-17 (×2): 4 [IU] via SUBCUTANEOUS
  Administered 2021-09-17: 3 [IU] via SUBCUTANEOUS
  Administered 2021-09-17: 7 [IU] via SUBCUTANEOUS
  Administered 2021-09-17 (×2): 4 [IU] via SUBCUTANEOUS
  Administered 2021-09-18: 15 [IU] via SUBCUTANEOUS
  Administered 2021-09-18 – 2021-09-19 (×3): 4 [IU] via SUBCUTANEOUS
  Administered 2021-09-19: 3 [IU] via SUBCUTANEOUS
  Administered 2021-09-19 (×2): 4 [IU] via SUBCUTANEOUS
  Administered 2021-09-19: 3 [IU] via SUBCUTANEOUS
  Administered 2021-09-19 – 2021-09-20 (×2): 4 [IU] via SUBCUTANEOUS
  Administered 2021-09-20: 11 [IU] via SUBCUTANEOUS
  Administered 2021-09-20 (×2): 3 [IU] via SUBCUTANEOUS
  Administered 2021-09-20 – 2021-09-21 (×4): 4 [IU] via SUBCUTANEOUS

## 2021-09-12 NOTE — Progress Notes (Signed)
Per Dr. Mosetta Putt ok to treat today using IV started on 4/3- Left arm. Blood return noted. Ok to proceed with treatment without compazine this infusion. Patient had temperature of 100.1 at start of infusion. MD made aware- ok to continue with chemotherapy.  ? ?Treatment completed at 4:30. VS stable  ? ? ?

## 2021-09-12 NOTE — Progress Notes (Addendum)
HEMATOLOGY-ONCOLOGY PROGRESS NOTE ? ?ASSESSMENT AND PLAN: ?61 yo female  ?  ?Acute encephalopathy, ? 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  ?Recommendations: ?-Labs from today have been reviewed.  Her WBC is 0.2, hemoglobin 7.3, platelets 33,000.  Pancytopenia due to recent chemotherapy and [place]. ?-We will plan to proceed with etoposide today as planned despite counts. ?-She will receive Granix on 4/8 and 4/9 and then will hold on Monday 4/10 in anticipation of chemotherapy on 4/11.  Orders have been entered. ?-Continue dexamethasone 20 mg IV daily.  Anticipate next dose of etoposide on 09/16/2021 ?-Transfuse PRBCs per ICU protocol.  Transfuse platelets for platelet count less than 20,000 or active bleeding. ?-Monitor CBC with differential daily. ?-We again discussed with the patient's niece who was at the bedside today that she does not seem to be making much improvement with chemotherapy.  We will plan to have more discussion on Monday regarding goals of care.  The patient's niece has expressed that she would like for her to continue on chemotherapy. ? ?Mikey Bussing, DNP, AGPCNP-BC, AOCNP ? ?SUBJECTIVE: Remains on the ventilator today.  Was off sedation for a period of time but is becoming more agitated so it was resumed.  No bleeding reported.  No fevers over the past 24 hours. ? ?REVIEW OF SYSTEMS:   ?Review of Systems  ?Reason unable to perform ROS: Sedated, on ventilator.  ? ?PHYSICAL EXAMINATION: ?ECOG PERFORMANCE STATUS: 4 - Bedbound ? ?Vitals:  ? 09/12/21 1300 09/12/21 1444  ?BP: (!) 135/108   ?Pulse: 99 (!) 102  ?Resp: 17 18  ?Temp:     ?SpO2: 98% 99%  ? ?Filed Weights  ? 08/30/21 0500 09/09/21 0500 09/12/21 0500  ?Weight: 70 kg 68.7 kg 71.9 kg  ? ? ?Intake/Output from previous day: ?04/06 0701 - 04/07 0700 ?In: 2247 [I.V.:388.8; NG/GT:1440; IV Piggyback:418.2] ?Out: [id number] ? ?Physical Exam ?Vitals reviewed.  ?Constitutional:   ?   Comments: On ventilator.  Eyes open but does not respond to me.  Generalized edema noted.  ?HENT:  ?   Head: Normocephalic.  ?Musculoskeletal:  ?   Right lower leg: Edema present.  ?   Left lower leg: Edema present.  ?Neurological:  ?   Comments: Sedated.  ? ? ?LABORATORY DATA:  ?I have reviewed the data as listed ? ?  Latest Ref Rng & Units 09/12/2021  ?  6:50 AM 09/11/2021  ?  6:13 AM 09/10/2021  ?  4:06 AM  ?CMP  ?Glucose 70 - 99 mg/dL 252   225   230    ?BUN 8 - 23 mg/dL 121   121   118    ?Creatinine 0.44 - 1.00 mg/dL 0.97   0.98   0.96    ?Sodium 135 - 145 mmol/L 151   147   147    ?Potassium 3.5 - 5.1 mmol/L 5.0   4.2   4.6    ?Chloride 98 - 111 mmol/L 114   115   114    ?CO2 22 - 32 mmol/L 30   26   26     ?Calcium 8.9 - 10.3 mg/dL 9.4   9.3   9.4    ?Total Protein 6.5 - 8.1 g/dL  5.4     ?  Total Bilirubin 0.3 - 1.2 mg/dL  0.7     ?Alkaline Phos 38 - 126 U/L  309     ?AST 15 - 41 U/L  74     ?ALT 0 - 44 U/L  116     ? ? ?Lab Results  ?Component Value Date  ? WBC 0.2 (LL) 09/12/2021  ? HGB 7.3 (L) 09/12/2021  ? HCT 22.6 (L) 09/12/2021  ? MCV 88.6 09/12/2021  ? PLT 33 (L) 09/12/2021  ? NEUTROABS 0.1 (LL) 09/12/2021  ? ? ?Lab Results  ?Component Value Date  ? CEA1 36.4 (H) 08/28/2021  ? ? ?CT HEAD WO CONTRAST (5MM) ? ?Result Date: 09/11/2021 ?CLINICAL DATA:  Altered mental status. EXAM: CT HEAD WITHOUT CONTRAST TECHNIQUE: Contiguous axial images were obtained from the base of the skull through the vertex without intravenous contrast. RADIATION DOSE REDUCTION: This exam was performed according to the departmental dose-optimization program which includes automated exposure control, adjustment of the mA and/or kV  according to patient size and/or use of iterative reconstruction technique. COMPARISON:  September 05, 2021. FINDINGS: Brain: Mild chronic ischemic white matter disease is noted. No mass effect or midline shift is noted. Ventricular size is within normal limits. There is no evidence of mass lesion, hemorrhage or acute infarction. Vascular: No hyperdense vessel or unexpected calcification. Skull: Normal. Negative for fracture or focal lesion. Sinuses/Orbits: Mild bilateral maxillary mucosal thickening is noted. Other: None. IMPRESSION: No acute intracranial abnormality seen. Electronically Signed   By: Marijo Conception M.D.   On: 09/11/2021 11:31  ? ?CT HEAD WO CONTRAST (5MM) ? ?Result Date: 09/05/2021 ?CLINICAL DATA:  Altered mental status. EXAM: CT HEAD WITHOUT CONTRAST TECHNIQUE: Contiguous axial images were obtained from the base of the skull through the vertex without intravenous contrast. RADIATION DOSE REDUCTION: This exam was performed according to the departmental dose-optimization program which includes automated exposure control, adjustment of the mA and/or kV according to patient size and/or use of iterative reconstruction technique. COMPARISON:  August 17, 2021. FINDINGS: Brain: Mild diffuse cortical atrophy is noted. No mass effect or midline shift is noted. Ventricular size is within normal limits. There is no evidence of mass lesion, hemorrhage or acute infarction. Vascular: No hyperdense vessel or unexpected calcification. Skull: Normal. Negative for fracture or focal lesion. Sinuses/Orbits: Left frontal, ethmoid and maxillary sinusitis is noted. Other: Fluid is noted in the mastoid air cells bilaterally. IMPRESSION: No acute intracranial abnormality seen. Electronically Signed   By: Marijo Conception M.D.   On: 09/05/2021 14:37  ? ?CT HEAD WO CONTRAST (5MM) ? ?Result Date: 08/08/2021 ?CLINICAL DATA:  61 year old female with history of delirium. Lower abdominal pain and constipation. EXAM: CT HEAD WITHOUT  CONTRAST TECHNIQUE: Contiguous axial images were obtained from the base of the skull through the vertex without intravenous contrast. RADIATION DOSE REDUCTION: This exam was performed according to the departmental dose-optimization program which includes automated exposure control, adjustment of the mA and/or kV according to patient size and/or use of iterative reconstruction technique. COMPARISON:  Head CT 08/06/2021. FINDINGS: Brain: Mild cerebral atrophy. Patchy and confluent areas of decreased attenuation are noted throughout the deep and periventricular white matter of the cerebral hemispheres bilaterally, compatible with chronic microvascular ischemic disease. No evidence of acute infarction, hemorrhage, hydrocephalus, extra-axial collection or mass lesion/mass effect. Vascular: No hyperdense vessel or unexpected calcification. Skull: Normal. Negative for fracture or focal lesion. Sinuses/Orbits: No acute finding. Other: None. IMPRESSION: 1. No acute intracranial abnormalities. 2. Mild cerebral atrophy with chronic microvascular  ischemic changes in the cerebral white matter, as above. Electronically Signed   By: Vinnie Langton M.D.   On: 08/12/2021 06:31  ? ?CT ABDOMEN W CONTRAST ? ?Result Date: 09/03/2021 ?CLINICAL DATA:  Candidiasis.  Evaluate for intra-abdominal abscess. EXAM: CT ABDOMEN WITH CONTRAST TECHNIQUE: Multidetector CT imaging of the abdomen was performed using the standard protocol following bolus administration of intravenous contrast. RADIATION DOSE REDUCTION: This exam was performed according to the departmental dose-optimization program which includes automated exposure control, adjustment of the mA and/or kV according to patient size and/or use of iterative reconstruction technique. CONTRAST:  167mL OMNIPAQUE IOHEXOL 300 MG/ML  SOLN COMPARISON:  09/02/2021 FINDINGS: Lower chest: Significant interval progression of bilateral interstitial and airspace opacities within both lungs.  Hepatobiliary: No focal liver abnormality. Small stone within the gallbladder measures 3 mm. No signs of gallbladder wall thickening or bile duct dilatation. Pancreas: Unremarkable. No pancreatic ductal dilatation or surroun

## 2021-09-12 NOTE — Progress Notes (Signed)
Ok to treat with etoposide with pancytopenia per Dr Burr Medico. ?

## 2021-09-12 NOTE — Progress Notes (Addendum)
? ?NAME:  Carrie [name], MRN:  [id number], DOB:  12-Nov-1960, LOS: 25 ?ADMISSION DATE:  08/25/2021, CONSULTATION DATE:  08/22/2021 ?REFERRING MD:  Darrick Meigs, CHIEF COMPLAINT:  Confusion  ? ?History of Present Illness:  ?61 year old woman with history of dermatomyositis and rheumatoid arthritis (on immunosuppressive therapy) who presented 3/12 with AMS, abdominal pain, constipation and volume depletion.  Found to have pancytopenia; seen by Hematology and initially felt to be related to mycophenolate/steroid therapy.  BMBx showed hypercellular marrow w/o blasts or malignant cells.  She developed worsening encephalopathy on 3/16 and Neuro was consulted.  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 ? ?On 3/17, patient had new onset seizure and required intubation for airway protection. PCCM consulted. She was transferred from [facility] to [facility] for LTM. Later felt to have [place] and started on dexamethasone and etoposide. Course complicated by Candidal fungemia likely from PICC line. ? ?Pertinent Medical History:  ?Rheumatoid arthritis, Dermatomyositis, Fatty liver, HLD, HTN, Depression, Anxiety ? 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 ?3/26 Candida fungemia for which started on micafungin. Dex and Etoposide stopped. [place] discussion with family - palliative care consulted  ?3/28 Febrile overnight for which cefepime added. WBC dropped to 1.4; started on Granix inj. ?3/31 restart dexamethasone ?4/01 Bronch/BAL ?4/03 restart etoposide ?4/04 change to pressure control to improve vent  synchrony ?4/05 Better tolerating pressure control. Increased peripheral edema. ?4/06 Continues to tolerate pressure control. ANC dropping, 100 today. Placed on Neutropenic Precautions. Ongoing peripheral edema. Nearing tracheostomy discussion. ?4/07 Tolerating pressure control.  Slightly more awake with weaned sedation, biting ETT with suctioning, withdrawing all 4 extremities to pain.  Per Onc, re-dose etoposide today.  Ongoing discussion re: extubation trial versus tracheostomy. ? ?Studies:  ?03/2021 labs >> ANA positive, Jo-1 neg, centromere neg, ds DNA neg, RNP positive, SSA/SSB neg, RF positive, chromatin Ab neg, CCP neg ?MRI 3/13 >> no acute findings ?3/17 bone marrow biopsy >> hypercellular bone marrow with trilineage hematopoiesis, comment: non-specific, follow up cytogenetic studies ?3/12 CT abdomen and pelvis >> no acute findings, adrenal nodule, diverticulosis ?3/12 CT head without contrast >> no acute findings ?3/16 EEG >> moderate diffuse encephalopathy of nonspecific etiology, no seizures or epileptiform discharges noted during the recording ?3/19 Echo >> EF 60 to 65%, mild LVH, grade 1 DD, mild/mod TR ?3/29 CT Abd >> b/l ASD, small GB stone, diffuse subcutaneous edema ?3/31 CT Head >> mild cortical atrophy ? ?Micro:  ?3/16 ParvoB19 IgG pos, IgM neg ?3/16 EBV IgG pos, IgM neg ?3/16 CMV IgG pos, IgM neg ?3/16 HHV6 IgG pos ?3/17 Bone marrow biopsy > hypercellular with trilineage hematopoiesis ?3/23 Blood culture 1/4 > Candida glabrata ?4/01 BAL > ?4/01 PCP > ?4/01 BAL AFB >  ?4/01 BAL Fungus >  ? ?Antibiotics:  ?Rocephin 3/12 ?Acyclovir 3/16>3/19  ?Unasyn 3/19 > 3/24 ?Micafungin 3/24 >  ?Cefepime 3/28 > 4/4 ? ?Interim History / Subjective:  ?No significant events overnight ?Remains on pressure control on vent this morning, tolerating well ?Slightly more awake off of sedation, biting ETT with suctioning ?WBC 0.2, ANC 0.1 - neutropenic precautions remain in place ?Per oncology, re-dose etoposide today  with Decadron premedication ?Rightward gaze deviation today, CT Head and EEG unremarkable 4/6PM ?Likely will need repeat MRI brain once clinically stable ? ?Objective:  ?Blood pressure 125/90, pulse (!) 103, temperature 98.2 ?F (36.8 ?C), temperature source Axillary, resp. rate 19, height _0  (1.6 m), weight 71.9 kg, SpO2 100 %. ?   ?Vent Mode: PCV ?FiO2 (%):  [40 %] 40 % ?Set Rate:  [18 bmp] 18 bmp ?PEEP:  [5 cmH20] 5 cmH20 ?Pressure Support:  [12 cmH20] 12 cmH20 ?Plateau Pressure:  [17 cmH20-19 cmH20] 18 cmH20  ? ?Intake/Output Summary (Last 24 hours) at 09/12/2021 0854 ?Last data filed at 09/12/2021 [phone] ?Gross per 24 hour  ?Intake 2250.56 ml  ?Output 4075 ml  ?Net -1824.44 ml  ? ? ?Filed Weights  ? 08/30/21 0500 09/09/21 0500 09/12/21 0500  ?Weight: 70 kg 68.7 kg 71.9 kg  ? ?Physical Examination: ?General: Chronically ill-appearing middle-aged woman in NAD. Mildly agitated. ?HEENT: [place]/AT, anicteric sclera, PERRL, rightward gaze preference today, moist mucous membranes. ?Neuro:  Lightly sedated, more active than previous exam. Biting ETT with suctioning.  Withdraws to pain in all 4 extremities. Not following commands. Moves BUE spontaneously. RLE with spontaneous movement, less on LLE. +Corneal, +Cough, and +Gag  ?CV: RRR, no m/g/r. ?PULM: Breathing even and unlabored on vent (pressure control, FiO2 40%). Lung fields coarse bilaterally but slightly improved from previous exam. ?GI: Soft, nontender, mildly distended. Normoactive bowel sounds. ?Extremities: Bilateral symmetric 1+ LE edema noted, bilateral symmetric 2+ pitting UE edema (improved from prior) ?Skin: Warm/dry, scattered ecchymosis to BLE/BUE, neck/upper chest. ? ?Resolved Hospital Problem List:   ?Aspiration pneumonia, Septic shock, Shock liver, AKI from ATN in setting of sepsis ? ?Assessment & Plan:  ? ?Candidal fungemia in setting of chronic immunosuppression ?HCAP ?- ID consulted, appreciate recs ?- Cefepime discontinued 4/4 ?- Micafungin x 2-week  course (end 4/9) ?- F/u finalized Cx data from 4/1 ? ?Acute metabolic encephalopathy, uncertain etiology ?New onset seizure. ?Hx of anxiety and depression. ?Concern for AI process versus [place] with CNS involvement. ?- RASS goal 0 to -1 ?- Continue Abilify, Lexapro, Klonopin ?- Continue Keppra ?- CT Head 4/6 for new leftward gaze deviation negative, NAICA ?- Spot EEG unremarkable ? ?[place] ?- Hematology/Oncology following ?- Primary versus MAS in the setting of connective tissue disease ?- Lab markers (increased IL-2 receptor) ?- Continue dexamethasone (resumed 3/31), pre-med before etoposide ?- Next etoposide today, 4/7 per Onc ?- Consider repeat MRI Brain ?- Unclear how well patient is responding to treatment, further [place] discussion needed ? ?Pancytopenia ?Neutropenia ?Multifactorial in the setting of chemotherapy and [place]. ?- Trend CBC/diff ?- Monitor ANC ?- Neutropenic precautions ?- Transfuse for Hgb < 7.0, Plt < 20 ? ?Acute hypoxic respiratory failure from HCAP ?- Continue pressure control vent support ?- Wean FiO2 for O2 sat > 90% ?- Daily WUA/SBT, more awake/SBT today 4/7 to determine if possibility for extubation ?- VAP bundle ?- Pulmonary hygiene ?- PAD protocol for sedation: Fentanyl for goal RASS 0 to -1 ?- Intermittent CXR ?- Pending chemo response/extubation trial, needs updated [place] discussion as we approach need for tracheostomy if unable to wean vent/extubate (intubated since 3/17) ? ?Steroid-induced hyperglycemia ?- Semglee ?- TF coverage  + SSI ?- CBGs Q4H ? ?Hx of dermatomyositis and rheumatoid arthritis ?-Continue dexamethasone  ? ?Hx of HTN ?Hypervolemia ?Hypernatremia ?- Continue Lopressor ?- Hold Lasix today, 4/7 given net -1.3L/24H ?- Monitor I&Os ?- FWF ? ?Pressure injury ?Stage 3 R hand - POA, stage 3 sacrum - POA,  stage 2 Lt buttock, not present on admission. ?- WOC, appreciate recs ? ?Goals of care ?- Palliative care consulted, appreciate assistance ?- Per last [place] conversation, family would  like to continue full scope of aggressive care ?- Upcoming discussion re: possible tracheostomy if unable to safely extubate ? ?Best Practice (right click and "Reselect all SmartList Selections" daily)  ? ?Diet/ty

## 2021-09-12 NOTE — Progress Notes (Signed)
Pharmacy Antibiotic Note ? ?Carrie [name] is a 61 y.o. female admitted on September 10, 2021 with aspiration pneumonia. Pt is on active chemotherapy. She has gotten several courses of abx. She is febrile again so we are restarting cefepime for febrile neutropenia.  ? ?Wbc 0.2, ANC 0.1 ?Scr <1 ? ?Plan: ?Cefepime 2g IV q8 ?Monitor clinical progress, c/s, renal function ?F/u LOT ? ? ?Height: 5\' 3"  (160 cm) ?Weight: 71.9 kg (158 lb 8.2 oz) ?IBW/kg (Calculated) : 52.4 ? ?Temp (24hrs), Avg:99 ?F (37.2 ?C), Min:98.2 ?F (36.8 ?C), Max:100.1 ?F (37.8 ?C) ? ?Recent Labs  ?Lab 09/08/21 ?5366 09/09/21 ?4403 09/10/21 ?0406 09/11/21 ?4742 09/12/21 ?5956  ?WBC 2.9* 3.5* 0.7* 0.3* 0.2*  ?CREATININE 0.98 1.01* 0.96 0.98 0.97  ? ?  ?Estimated Creatinine Clearance: 57.1 mL/min (by C-G formula based on SCr of 0.97 mg/dL).   ? ?Allergies  ?Allergen Reactions  ? Zoloft [Sertraline Hcl] Diarrhea and Other (See Comments)  ?  Stomach upset ?  ? ?Acyclovir 3/16>3/19 ?Rocephin 3/12 x 1, 3/16 x 1 ?Unasyn 3/20 > 3/24  ?Micafungin 3/24>> (4/9 - per ID note 4/4) ?Cefepime 3/28>>4/4 4/7>> ?  ?3/12 BCx: 1 of 3 bottles Staph auricularis F- contaminant ?3/16 BCx2: Negative ?3/23 BCx: C glabrata ?3/26 BCx: Neg ?3/31 TA - few gpc, gpr >> few OPF ?4/01 TA - GPCs, GVRs >> few OPF ?CT ab neg - r/o concern for disseminated inf/spleen ? ?Ulyses [place]ward, PharmD, BCIDP, AAHIVP, CPP ?Infectious Disease Pharmacist ?09/12/2021 3:36 PM ? ? ?

## 2021-09-12 NOTE — Progress Notes (Signed)
Date and time results received: 09/12/21 0800 ?(use smartphrase ".now" to insert current time) ? ?Test: CBC ?Critical Value: WBC 0.2 ? ?Name of Provider Notified: Cloyd Stagers, PA ? ?Orders Received? Or Actions Taken?:  Provider aware. ?

## 2021-09-13 DIAGNOSIS — B377 Candidal sepsis: Secondary | ICD-10-CM | POA: Diagnosis not present

## 2021-09-13 LAB — COMPREHENSIVE METABOLIC PANEL
ALT: 128 U/L — ABNORMAL HIGH (ref 0–44)
AST: 93 U/L — ABNORMAL HIGH (ref 15–41)
Albumin: 1.6 g/dL — ABNORMAL LOW (ref 3.5–5.0)
Alkaline Phosphatase: 305 U/L — ABNORMAL HIGH (ref 38–126)
Anion gap: 7 (ref 5–15)
BUN: 117 mg/dL — ABNORMAL HIGH (ref 8–23)
CO2: 28 mmol/L (ref 22–32)
Calcium: 9.2 mg/dL (ref 8.9–10.3)
Chloride: 117 mmol/L — ABNORMAL HIGH (ref 98–111)
Creatinine, Ser: 0.91 mg/dL (ref 0.44–1.00)
GFR, Estimated: >60 mL/min (ref >60)
Glucose, Bld: 107 mg/dL — ABNORMAL HIGH (ref 70–99)
Potassium: 5.1 mmol/L (ref 3.5–5.1)
Sodium: 152 mmol/L — ABNORMAL HIGH (ref 135–145)
Total Bilirubin: 0.7 mg/dL (ref 0.3–1.2)
Total Protein: 5.5 g/dL — ABNORMAL LOW (ref 6.5–8.1)

## 2021-09-13 LAB — GLUCOSE, CAPILLARY
Glucose-Capillary: 107 mg/dL — ABNORMAL HIGH (ref 70–99)
Glucose-Capillary: 114 mg/dL — ABNORMAL HIGH (ref 70–99)
Glucose-Capillary: 125 mg/dL — ABNORMAL HIGH (ref 70–99)
Glucose-Capillary: 143 mg/dL — ABNORMAL HIGH (ref 70–99)
Glucose-Capillary: 188 mg/dL — ABNORMAL HIGH (ref 70–99)

## 2021-09-13 LAB — MAGNESIUM: Magnesium: 1.9 mg/dL (ref 1.7–2.4)

## 2021-09-13 NOTE — Progress Notes (Signed)
? ?NAME:  Carrie [name], MRN:  [id number], DOB:  Sep 25, 1960, LOS: 3 ?ADMISSION DATE:  09/02/2021, CONSULTATION DATE:  08/22/2021 ?REFERRING MD:  Darrick Meigs, CHIEF COMPLAINT:  Confusion  ? ?History of Present Illness:  ?61 year old woman with history of dermatomyositis and rheumatoid arthritis (on immunosuppressive therapy) who presented 3/12 with AMS, abdominal pain, constipation and volume depletion.  Found to have pancytopenia; seen by Hematology and initially felt to be related to mycophenolate/steroid therapy.  BMBx showed hypercellular marrow w/o blasts or malignant cells.  She developed worsening encephalopathy on 3/16 and Neuro was consulted.  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 ? ?On 3/17, patient had new onset seizure and required intubation for airway protection. PCCM consulted. She was transferred from [facility] to [facility] for LTM. Later felt to have [place] and started on dexamethasone and etoposide. Course complicated by Candidal fungemia likely from PICC line. ? ?Pertinent Medical History:  ?Rheumatoid arthritis, Dermatomyositis, Fatty liver, HLD, HTN, Depression, Anxiety ? 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 ?3/26 Candida fungemia for which started on micafungin. Dex and Etoposide stopped. [place] discussion with family - palliative care consulted  ?3/28 Febrile overnight for which cefepime added. WBC dropped to 1.4; started on Granix inj. ?3/31 restart dexamethasone ?4/01 Bronch/BAL ?4/03 restart etoposide ?4/04 change to pressure control to improve vent  synchrony ?4/05 Better tolerating pressure control. Increased peripheral edema. ?4/06 Continues to tolerate pressure control. ANC dropping, 100 today. Placed on Neutropenic Precautions. Ongoing peripheral edema. Nearing tracheostomy discussion. ?4/07 Tolerating pressure control.  Slightly more awake with weaned sedation, biting ETT with suctioning, withdrawing all 4 extremities to pain.  Per Onc, re-dose etoposide today.  Ongoing discussion re: extubation trial versus tracheostomy. ? ?Studies:  ?03/2021 labs >> ANA positive, Jo-1 neg, centromere neg, ds DNA neg, RNP positive, SSA/SSB neg, RF positive, chromatin Ab neg, CCP neg ?MRI 3/13 >> no acute findings ?3/17 bone marrow biopsy >> hypercellular bone marrow with trilineage hematopoiesis, comment: non-specific, follow up cytogenetic studies ?3/12 CT abdomen and pelvis >> no acute findings, adrenal nodule, diverticulosis ?3/12 CT head without contrast >> no acute findings ?3/16 EEG >> moderate diffuse encephalopathy of nonspecific etiology, no seizures or epileptiform discharges noted during the recording ?3/19 Echo >> EF 60 to 65%, mild LVH, grade 1 DD, mild/mod TR ?3/29 CT Abd >> b/l ASD, small GB stone, diffuse subcutaneous edema ?3/31 CT Head >> mild cortical atrophy ? ?Micro:  ?3/16 ParvoB19 IgG pos, IgM neg ?3/16 EBV IgG pos, IgM neg ?3/16 CMV IgG pos, IgM neg ?3/16 HHV6 IgG pos ?3/17 Bone marrow biopsy > hypercellular with trilineage hematopoiesis ?3/23 Blood culture 1/4 > Candida glabrata ?4/01 BAL > ?4/01 PCP > ?4/01 BAL AFB >  ?4/01 BAL Fungus >  ? ?Antibiotics:  ?Rocephin 3/12 ?Acyclovir 3/16>3/19  ?Unasyn 3/19 > 3/24 ?Micafungin 3/24 >  ?Cefepime 3/28 > 4/4 ? ?Interim History / Subjective:  ?Low grade fever 100.3. started on vancomycin/cefepime for febrile neutropenia.  ?Fentanyl was held yesterday afternoon and she became restless and agitated so it was resumed.  ? ?Objective:  ?Blood pressure 134/87, pulse (!) 116, temperature 100.3 ?F (37.9 ?  C),  temperature source Axillary, resp. rate (!) 21, height _0  (1.6 m), weight 71.9 kg, SpO2 99 %. ?   ?Vent Mode: PCV ?FiO2 (%):  [40 %] 40 % ?Set Rate:  [18 bmp] 18 bmp ?PEEP:  [5 cmH20] 5 cmH20 ?Pressure Support:  [15 cmH20] 15 cmH20 ?Plateau Pressure:  [10 cmH20-19 cmH20] 17 cmH20  ? ?Intake/Output Summary (Last 24 hours) at 09/13/2021 1147 ?Last data filed at 09/13/2021 0900 ?Gross per 24 hour  ?Intake 2847.33 ml  ?Output 3150 ml  ?Net -302.67 ml  ? ?Filed Weights  ? 08/30/21 0500 09/09/21 0500 09/12/21 0500  ?Weight: 70 kg 68.7 kg 71.9 kg  ? ?Physical Examination: ?Gen:      Intubated, sedated, acutely ill appearing ?HEENT:  ETT to vent ?Lungs:    sounds of mechanical ventilation auscultated  ?CV:         tachycardic, regular ?Abd:      + bowel sounds; soft, non-tender; no palpable masses, no distension ?Ext:    Edematous bilaterally ?Skin:      Warm and dry; no rashes ?Neuro:   sedated, RASS -3 ? ?Resolved Hospital Problem List:   ?Aspiration pneumonia, Septic shock, Shock liver, AKI from ATN in setting of sepsis ? ?Assessment & Plan:  ? ?Candidal fungemia in setting of chronic immunosuppression ?HCAP ?- ID consulted, appreciate recs ?- Cefepime discontinued 4/4 ?- Micafungin x 2-week course (end 4/9) ?- F/u finalized Cx data from 4/1 ? ?Acute metabolic encephalopathy, uncertain etiology ?New onset seizure. ?Hx of anxiety and depression. ?Concern for AI process versus [place] with CNS involvement. ?- RASS goal 0 to -1 ?- Continue Abilify, Lexapro, Klonopin ?- Continue Keppra ?- CT Head 4/6 for new leftward gaze deviation negative, NAICA ?- Spot EEG unremarkable ? ?[place] ?- Hematology/Oncology following ?- Primary versus MAS in the setting of connective tissue disease ?- Lab markers (increased IL-2 receptor) ?- Continue dexamethasone (resumed 3/31), pre-med before etoposide ?- Has received 3 doses etoposide per Onc ?- Consider repeat MRI Brain ?- Unclear how well patient is responding to treatment, further [place] discussion  needed ? ?Pancytopenia ?Neutropenia ?Multifactorial in the setting of chemotherapy and [place]. ?- Trend CBC/diff ?- Monitor ANC ?- Neutropenic precautions ?- Transfuse for Hgb < 7.0, Plt < 20 ? ?Acute hypoxic respiratory failure from HCAP ?- Continue pressure control vent support ?- Wean FiO2 for O2 sat > 90% ?- Daily WUA/SBT, more awake/SBT today 4/7 to determine if possibility for extubation ?- VAP bundle ?- Pulmonary hygiene ?- PAD protocol for sedation: Fentanyl for goal RASS 0 to -1 ?- Intermittent CXR ?- Pending chemo response/extubation trial, needs updated [place] discussion as we approach need for tracheostomy if unable to wean vent/extubate (intubated since 3/17) ? ?Steroid-induced hyperglycemia ?- Semglee ?- TF coverage  + SSI ?- CBGs Q4H ? ?Hx of dermatomyositis and rheumatoid arthritis ?-Continue dexamethasone  ? ?Hx of HTN ?Hypervolemia ?Hypernatremia ?- Continue Lopressor ?- Hold Lasix today, 4/7 given net -1.3L/24H ?- Monitor I&Os ?- FWF ? ?Pressure injury ?Stage 3 R hand - POA, stage 3 sacrum - POA, stage 2 Lt buttock, not present on admission. ?- WOC, appreciate recs ? ?Goals of care ?- Palliative care consulted, appreciate assistance ?- Per last [place] conversation, family would like to continue full scope of aggressive care ?- Upcoming discussion re: possible tracheostomy if unable to safely extubate ? ?Best Practice (right click and "Reselect all SmartList Selections" daily)  ? ?Diet/type: tubefeeds ?DVT prophylaxis: SCD ?GI prophylaxis: PPI ?Lines: see above ?Foley:  N/A and removal ordered  ?Code Status:  full code ?Last date of multidisciplinary goals of care discussion: 13/5 (Dr. Halford Chessman - PCCM). Plan for goals of care meeting Monday with Oncology and palliative care.  ? ?Critical care time: 36 minutes  ? ?The patient is critically ill due to respiratory failure, pancytopenia.  Critical care was necessary to treat or prevent imminent or life-threatening deterioration.  Critical care was time spent  personally by me on the following activities: development of treatment plan with patient and/or surrogate as well as nursing, discussions with consultants, evaluation of patient's response to treatment, examination

## 2021-09-13 NOTE — Progress Notes (Signed)
?  09/13/21 0751  ?Airway 7.5 mm  ?Placement Date/Time: 08/22/21 1837   Grade View: Grade 1  Placed By: ICU physician  Airway Device: Endotracheal Tube  Laryngoscope Blade: MAC;3  ETT Types: Oral  Size (mm): 7.5 mm  Cuffed: Cuffed  Insertion attempts: 1  Airway Equipment: Stylet;Lighte...  ?Secured at (cm) 24 cm  ?Measured From Lips  ?Secured Location Right  ?Secured By [employer]  ?Tube Holder Repositioned Yes  ?Prone position No  ?Cuff Pressure (cm H2O) Clear OR 27-39 CmH2O  ?Site Condition Dry  ?Adult Ventilator Settings  ?Vent Type Servo i  ?Humidity HME  ?Vent Mode PCV  ?Set Rate 18 bmp  ?FiO2 (%) 40 %  ?I Time 0.9 Sec(s)  ?Pressure Control 15 cmH20  ?PEEP 5 cmH20  ?Adult Ventilator Measurements  ?Peak Airway Pressure 20 L/min  ?Mean Airway Pressure 9 cmH20  ?Plateau Pressure 17 cmH20  ?Resp Rate Spontaneous 2 br/min  ?Resp Rate Total 20 br/min  ?Exhaled Vt 544 mL  ?Measured Ve 9.9 mL  ?Auto PEEP 1 cmH20  ?Total PEEP 6 cmH20  ?SpO2 98 %  ?Adult Ventilator Alarms  ?Alarms On Y  ?Ve High Alarm 20 L/min  ?Ve Low Alarm 4 L/min  ?Resp Rate High Alarm 38 br/min  ?Resp Rate Low Alarm 8  ?PEEP Low Alarm 3 cmH2O  ?Press High Alarm 45 cmH2O  ?Daily Weaning Assessment  ?Daily Assessment of Readiness to Wean Wean protocol criteria met (SBT performed)  ?SBT Method CPAP 5 cm H20 and PS 5 cm H20  ?Weaning Start Time 0750  ?Patient response Failed SBT terminated  ?Reason SBT Terminated RR > 35 breaths/min or Frequency/TV ratio >105  ?Breath Sounds  ?Bilateral Breath Sounds Diminished  ?Airway Suctioning/Secretions  ?Suction Type ETT  ?Suction Device  Catheter  ?Secretion Amount Small  ?Secretion Color Tan  ?Secretion Consistency Thick  ?Suction Tolerance Tolerated well  ?Suctioning Adverse Effects None  ? ? ?

## 2021-09-14 ENCOUNTER — Inpatient Hospital Stay (HOSPITAL_COMMUNITY): Payer: Federal, State, Local not specified - PPO

## 2021-09-14 DIAGNOSIS — B377 Candidal sepsis: Secondary | ICD-10-CM | POA: Diagnosis not present

## 2021-09-14 LAB — CBC WITH DIFFERENTIAL/PLATELET
Abs Immature Granulocytes: 0 10*3/uL (ref 0.00–0.07)
Basophils Absolute: 0 10*3/uL (ref 0.0–0.1)
Basophils Relative: 0 %
Eosinophils Absolute: 0 10*3/uL (ref 0.0–0.5)
Eosinophils Relative: 0 %
HCT: 22.2 % — ABNORMAL LOW (ref 36.0–46.0)
Hemoglobin: 7.1 g/dL — ABNORMAL LOW (ref 12.0–15.0)
Immature Granulocytes: 0 %
Lymphocytes Relative: 85 %
Lymphs Abs: 0.2 10*3/uL — ABNORMAL LOW (ref 0.7–4.0)
MCH: 28.5 pg (ref 26.0–34.0)
MCHC: 32 g/dL (ref 30.0–36.0)
MCV: 89.2 fL (ref 80.0–100.0)
Monocytes Absolute: 0 10*3/uL — ABNORMAL LOW (ref 0.1–1.0)
Monocytes Relative: 0 %
Neutro Abs: 0 10*3/uL — CL (ref 1.7–7.7)
Neutrophils Relative %: 15 %
Platelets: 32 10*3/uL — ABNORMAL LOW (ref 150–400)
RBC: 2.49 MIL/uL — ABNORMAL LOW (ref 3.87–5.11)
RDW: 19.1 % — ABNORMAL HIGH (ref 11.5–15.5)
WBC: 0.2 10*3/uL — CL (ref 4.0–10.5)
nRBC: 0 % (ref 0.0–0.2)

## 2021-09-14 LAB — PREPARE RBC (CROSSMATCH)

## 2021-09-14 LAB — COMPREHENSIVE METABOLIC PANEL
ALT: 103 U/L — ABNORMAL HIGH (ref 0–44)
AST: 64 U/L — ABNORMAL HIGH (ref 15–41)
Albumin: <1.5 g/dL — ABNORMAL LOW (ref 3.5–5.0)
Alkaline Phosphatase: 228 U/L — ABNORMAL HIGH (ref 38–126)
Anion gap: 7 (ref 5–15)
BUN: 106 mg/dL — ABNORMAL HIGH (ref 8–23)
CO2: 25 mmol/L (ref 22–32)
Calcium: 9.1 mg/dL (ref 8.9–10.3)
Chloride: 120 mmol/L — ABNORMAL HIGH (ref 98–111)
Creatinine, Ser: 0.92 mg/dL (ref 0.44–1.00)
GFR, Estimated: >60 mL/min (ref >60)
Glucose, Bld: 178 mg/dL — ABNORMAL HIGH (ref 70–99)
Potassium: 4.5 mmol/L (ref 3.5–5.1)
Sodium: 152 mmol/L — ABNORMAL HIGH (ref 135–145)
Total Bilirubin: 1 mg/dL (ref 0.3–1.2)
Total Protein: 5.3 g/dL — ABNORMAL LOW (ref 6.5–8.1)

## 2021-09-14 LAB — GLUCOSE, CAPILLARY
Glucose-Capillary: 151 mg/dL — ABNORMAL HIGH (ref 70–99)
Glucose-Capillary: 157 mg/dL — ABNORMAL HIGH (ref 70–99)
Glucose-Capillary: 162 mg/dL — ABNORMAL HIGH (ref 70–99)
Glucose-Capillary: 199 mg/dL — ABNORMAL HIGH (ref 70–99)
Glucose-Capillary: 211 mg/dL — ABNORMAL HIGH (ref 70–99)
Glucose-Capillary: 211 mg/dL — ABNORMAL HIGH (ref 70–99)
Glucose-Capillary: 217 mg/dL — ABNORMAL HIGH (ref 70–99)
Glucose-Capillary: 47 mg/dL — ABNORMAL LOW (ref 70–99)

## 2021-09-14 LAB — CBC
HCT: 21.5 % — ABNORMAL LOW (ref 36.0–46.0)
Hemoglobin: 7.1 g/dL — ABNORMAL LOW (ref 12.0–15.0)
MCH: 28.7 pg (ref 26.0–34.0)
MCHC: 33 g/dL (ref 30.0–36.0)
MCV: 87 fL (ref 80.0–100.0)
Platelets: 35 10*3/uL — ABNORMAL LOW (ref 150–400)
RBC: 2.47 MIL/uL — ABNORMAL LOW (ref 3.87–5.11)
RDW: 17.7 % — ABNORMAL HIGH (ref 11.5–15.5)
WBC: <0.1 10*3/uL — CL (ref 4.0–10.5)
nRBC: 0 % (ref 0.0–0.2)

## 2021-09-14 IMAGING — MR MR HEAD W/O CM
13 series · 48 of 48 positions shown · non-contrast
Comparison: Head CT [DATE]

CLINICAL DATA: Encephalopathy.

EXAM:
MRI HEAD WITHOUT CONTRAST
TECHNIQUE: Multiplanar, multiecho pulse sequences of the brain and surrounding
structures were obtained without intravenous contrast.

[Series 9: DWI · axial · 3.0mm · 0.88mm/px · z∈[-91,+58]mm · 9 of 104 slices shown (1 of 4)]
[im 1/104]
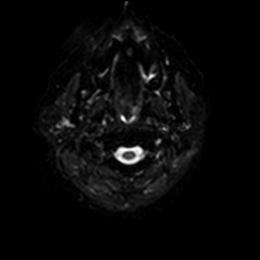
[im 13/104]
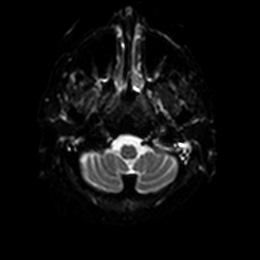
[im 26/104]
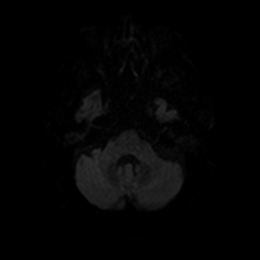
[im 39/104]
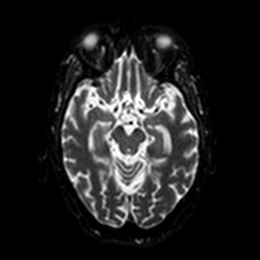
[im 52/104]
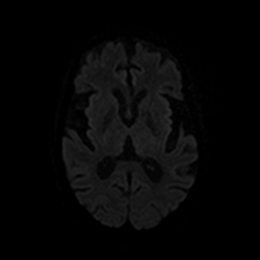
[im 65/104]
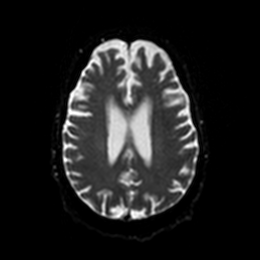
[im 78/104]
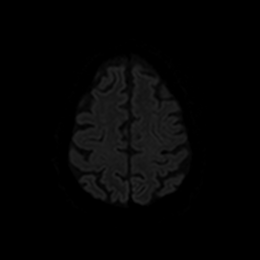
[im 91/104]
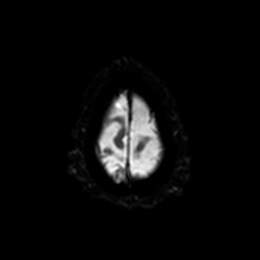
[im 104/104]
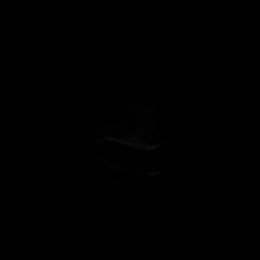

[Series 10: DWI · axial · 3.0mm · 0.88mm/px · z∈[-91,+58]mm · 4 of 52 slices shown (2 of 4)]
[im 1/52]
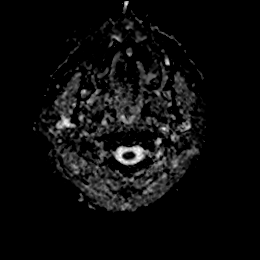
[im 18/52]
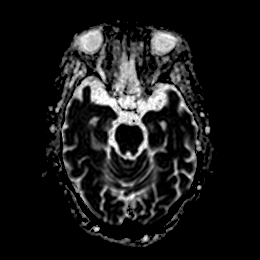
[im 35/52]
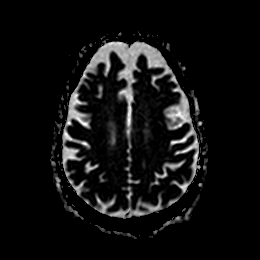
[im 52/52]
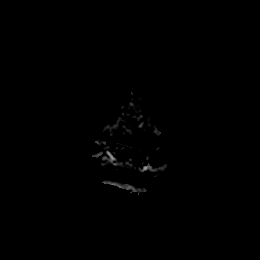

[Series 11: DWI · coronal · 4.0mm · 0.88mm/px · 6 of 74 slices shown (3 of 4)]
[im 1/74]
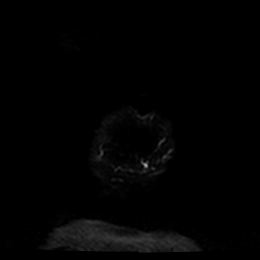
[im 15/74]
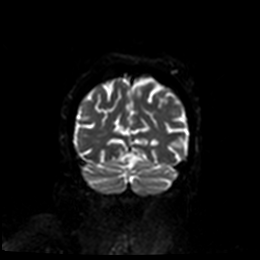
[im 30/74]
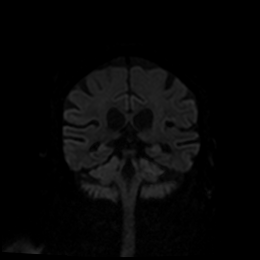
[im 44/74]
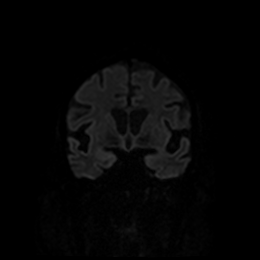
[im 59/74]
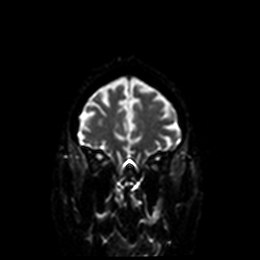
[im 74/74]
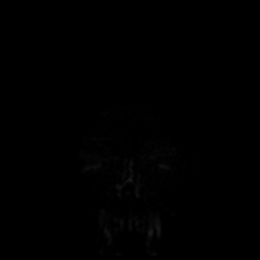

[Series 12: DWI · coronal · 4.0mm · 0.88mm/px · 3 of 37 slices shown (4 of 4)]
[im 1/37]
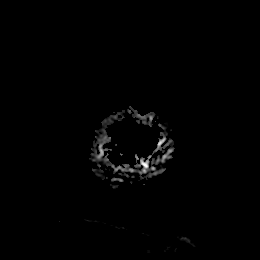
[im 19/37]
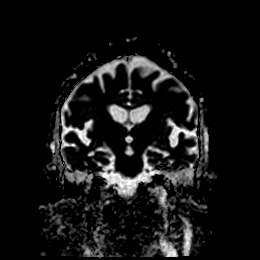
[im 37/37]
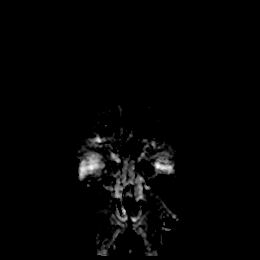

[Series 13: T1 · sagittal · 5.0mm · 0.75mm/px · 2 of 23 slices shown]
[im 1/23]
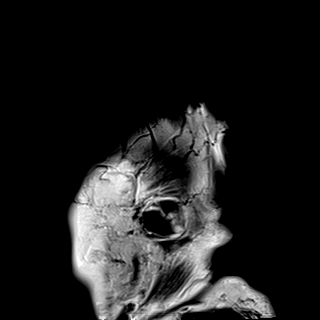
[im 23/23]
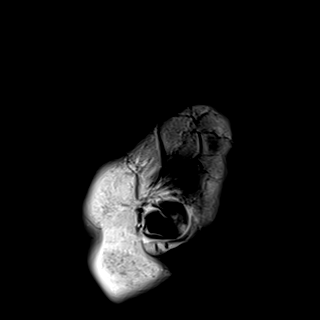

[Series 14: T2 · axial · 5.0mm · 0.72mm/px · z∈[-90,+56]mm · 2 of 26 slices shown (1 of 2)]
[im 1/26]
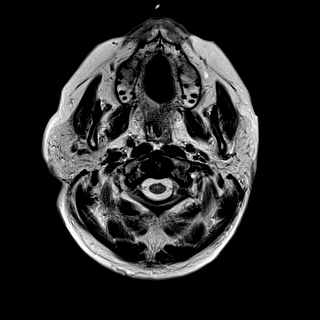
[im 26/26]
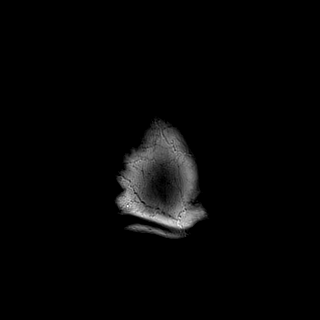

[Series 16: mag_images · axial · 3.0mm · 0.90mm/px · z∈[-89,+60]mm · 4 of 52 slices shown]
[im 1/52]
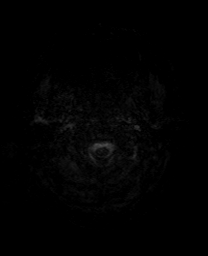
[im 18/52]
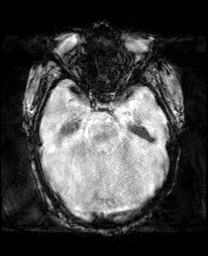
[im 35/52]
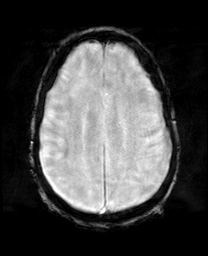
[im 52/52]
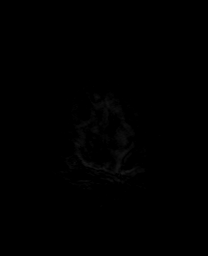

[Series 17: pha_images · axial · 3.0mm · 0.90mm/px · z∈[-86,+60]mm · 4 of 51 slices shown]
[im 1/51]
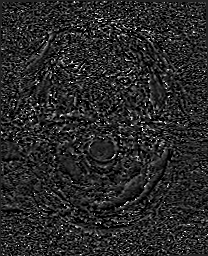
[im 17/51]
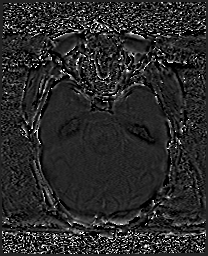
[im 34/51]
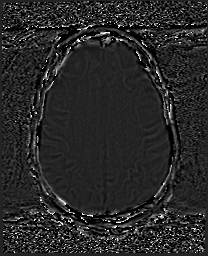
[im 51/51]
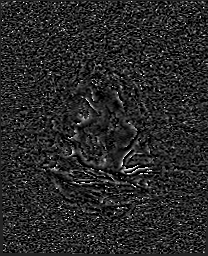

[Series 18: swi_images · axial · 3.0mm · 0.90mm/px · z∈[-89,+60]mm · 4 of 52 slices shown]
[im 1/52]
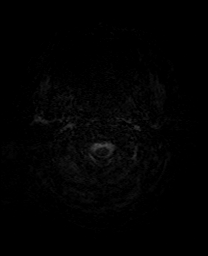
[im 18/52]
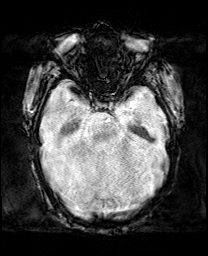
[im 35/52]
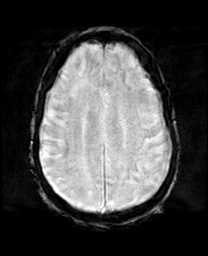
[im 52/52]
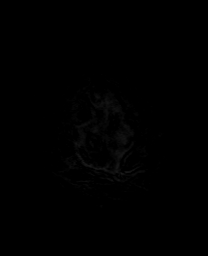

[Series 19: mip_images(sw) · axial · 24.0mm · 0.90mm/px · z∈[-78,+50]mm · 4 of 45 slices shown]
[im 1/45]
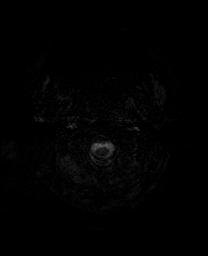
[im 15/45]
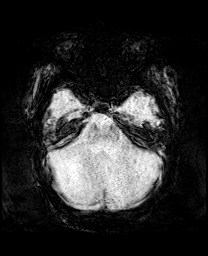
[im 30/45]
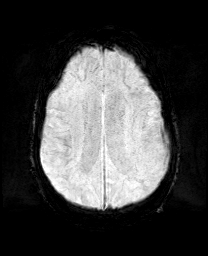
[im 45/45]
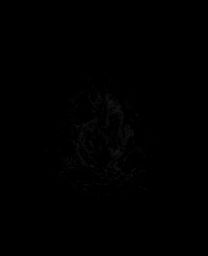

[Series 20: FLAIR · axial · 5.0mm · 0.90mm/px · z∈[-90,+56]mm · 2 of 26 slices shown (1 of 2)]
[im 1/26]
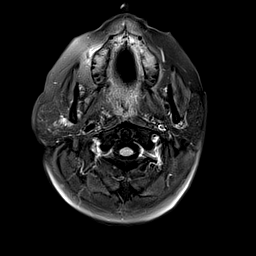
[im 26/26]
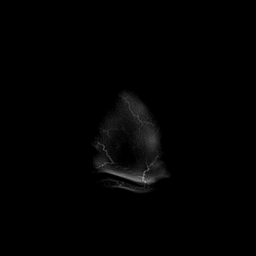

[Series 22: T2 · coronal · 5.0mm · 0.72mm/px · 2 of 29 slices shown (2 of 2)]
[im 1/29]
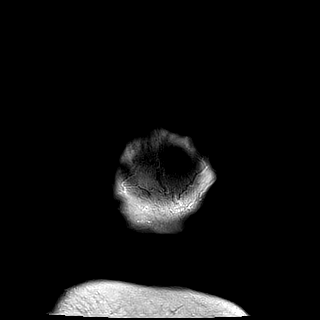
[im 29/29]
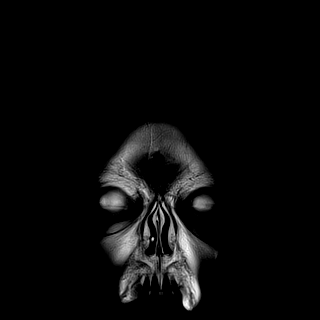

[Series 23: FLAIR · axial · 5.0mm · 0.90mm/px · z∈[-90,+56]mm · 2 of 26 slices shown (2 of 2)]
[im 1/26]
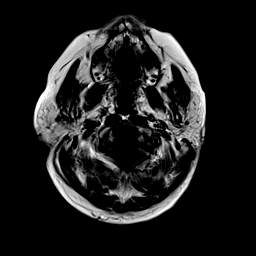
[im 26/26]
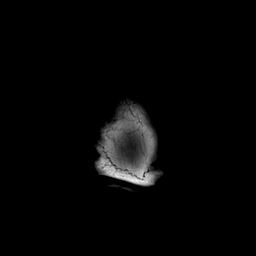

[48 of 48 positions shown; findings below may reference images not displayed]

FINDINGS: Brain: Diffusion imaging does not show any acute or subacute
infarction. No focal abnormality affects the brainstem or
cerebellum. Cerebral hemispheres show generalized atrophy. No
evidence of small vessel disease or old cortical infarction. No mass
lesion, hemorrhage, hydrocephalus or extra-axial collection

Vascular: Major vessels at the base of the brain show flow.

Skull and upper cervical spine: Negative

Sinuses/Orbits: Clear/normal

Other: Bilateral mastoid effusions.
IMPRESSION: No acute or focal finding.  Generalized brain atrophy.

Bilateral mastoid effusions.

## 2021-09-14 MED ORDER — DEXTROSE 50 % IV SOLN
25.0000 g | INTRAVENOUS | Status: AC
  Administered 2021-09-14: 25 g via INTRAVENOUS
  Filled 2021-09-14: qty 50

## 2021-09-14 MED ORDER — SODIUM CHLORIDE 0.9% IV SOLUTION: Freq: Once | INTRAVENOUS | Status: AC

## 2021-09-14 MED ORDER — INSULIN ASPART 100 UNIT/ML IJ SOLN
6.0000 [IU] | INTRAMUSCULAR | Status: DC
  Administered 2021-09-14 – 2021-09-21 (×35): 6 [IU] via SUBCUTANEOUS

## 2021-09-14 MED ORDER — SODIUM CHLORIDE 0.9% IV SOLUTION
Freq: Once | INTRAVENOUS | Status: DC
Start: 2021-09-14 — End: 2021-09-20

## 2021-09-14 MED ORDER — FREE WATER
200.0000 mL | Status: DC
  Administered 2021-09-14 – 2021-09-21 (×80): 200 mL

## 2021-09-14 MED ORDER — TBO-FILGRASTIM 480 MCG/0.8ML ~~LOC~~ SOSY
480.0000 ug | PREFILLED_SYRINGE | Freq: Once | SUBCUTANEOUS | Status: AC
  Filled 2021-09-14: qty 0.8

## 2021-09-14 NOTE — Progress Notes (Signed)
? ?NAME:  Carrie [name], MRN:  [id number], DOB:  05-May-1961, LOS: 48 ?ADMISSION DATE:  08/22/2021, CONSULTATION DATE:  08/22/2021 ?REFERRING MD:  Darrick Meigs, CHIEF COMPLAINT:  Confusion  ? ?History of Present Illness:  ?61 year old woman with history of dermatomyositis and rheumatoid arthritis (on immunosuppressive therapy) who presented 3/12 with AMS, abdominal pain, constipation and volume depletion.  Found to have pancytopenia; seen by Hematology and initially felt to be related to mycophenolate/steroid therapy.  BMBx showed hypercellular marrow w/o blasts or malignant cells.  She developed worsening encephalopathy on 3/16 and Neuro was consulted.  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 ? ?On 3/17, patient had new onset seizure and required intubation for airway protection. PCCM consulted. She was transferred from [facility] to [facility] for LTM. Later felt to have [place] and started on dexamethasone and etoposide. Course complicated by Candidal fungemia likely from PICC line. ? ?Pertinent Medical History:  ?Rheumatoid arthritis, Dermatomyositis, Fatty liver, HLD, HTN, Depression, Anxiety ? 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 ?3/26 Candida fungemia for which started on micafungin. Dex and Etoposide stopped. [place] discussion with family - palliative care consulted  ?3/28 Febrile overnight for which cefepime added. WBC dropped to 1.4; started on Granix inj. ?3/31 restart dexamethasone ?4/01 Bronch/BAL ?4/03 restart etoposide ?4/04 change to pressure control to improve vent  synchrony ?4/05 Better tolerating pressure control. Increased peripheral edema. ?4/06 Continues to tolerate pressure control. ANC dropping, 100 today. Placed on Neutropenic Precautions. Ongoing peripheral edema. Nearing tracheostomy discussion. ?4/07 Tolerating pressure control.  Slightly more awake with weaned sedation, biting ETT with suctioning, withdrawing all 4 extremities to pain.  Per Onc, re-dose etoposide today.  Ongoing discussion re: extubation trial versus tracheostomy. ? ?Studies:  ?03/2021 labs >> ANA positive, Jo-1 neg, centromere neg, ds DNA neg, RNP positive, SSA/SSB neg, RF positive, chromatin Ab neg, CCP neg ?MRI 3/13 >> no acute findings ?3/17 bone marrow biopsy >> hypercellular bone marrow with trilineage hematopoiesis, comment: non-specific, follow up cytogenetic studies ?3/12 CT abdomen and pelvis >> no acute findings, adrenal nodule, diverticulosis ?3/12 CT head without contrast >> no acute findings ?3/16 EEG >> moderate diffuse encephalopathy of nonspecific etiology, no seizures or epileptiform discharges noted during the recording ?3/19 Echo >> EF 60 to 65%, mild LVH, grade 1 DD, mild/mod TR ?3/29 CT Abd >> b/l ASD, small GB stone, diffuse subcutaneous edema ?3/31 CT Head >> mild cortical atrophy ? ?Micro:  ?3/16 ParvoB19 IgG pos, IgM neg ?3/16 EBV IgG pos, IgM neg ?3/16 CMV IgG pos, IgM neg ?3/16 HHV6 IgG pos ?3/17 Bone marrow biopsy > hypercellular with trilineage hematopoiesis ?3/23 Blood culture 1/4 > Candida glabrata ?4/01 BAL > ?4/01 PCP > ?4/01 BAL AFB >  ?4/01 BAL Fungus >  ? ?Antibiotics:  ?Rocephin 3/12 ?Acyclovir 3/16>3/19  ?Unasyn 3/19 > 3/24 ?Micafungin 3/24 >  ?Cefepime 3/28 > 4/4, resumed 4/7.  ? ?Interim History / Subjective:  ?Ongoing low grade fevers. On pressure support trial this morning. Bleeding and clots being passed from rectum.  ? ?Objective:  ?Blood pressure 114/74, pulse (!) 122, temperature 99.6 ?F (37.6 ?C), temperature source Axillary, resp. rate Marland Kitchen)  26, height  _0  (1.6 m), weight 71.6 kg, SpO2 100 %. ?   ?Vent Mode: PSV;CPAP ?FiO2 (%):  [40 %] 40 % ?Set Rate:  [18 bmp] 18 bmp ?PEEP:  [5 cmH20] 5 cmH20 ?Pressure Support:  [12 cmH20] 12 cmH20 ?Plateau Pressure:  [18 cmH20] 18 cmH20  ? ?Intake/Output Summary (Last 24 hours) at 09/14/2021 0954 ?Last data filed at 09/14/2021 0800 ?Gross per 24 hour  ?Intake 2770.75 ml  ?Output 1950 ml  ?Net 820.75 ml  ? ?Filed Weights  ? 09/09/21 0500 09/12/21 0500 09/14/21 0500  ?Weight: 68.7 kg 71.9 kg 71.6 kg  ? ?Physical Examination: ?Gen:      Intubated, sedated, acutely ill appearing ?HEENT:  ETT to vent ?Lungs:    sounds of mechanical ventilation auscultated  ?CV:         tachycardic, regular ?Abd:      + bowel sounds; soft, non-tender; no palpable masses, no distension ?Ext:    Edematous bilaterally ?Skin:      Warm and dry; diffuse ecchymoses everywhere.  ?Neuro:   sedated, not responsive ? ?Resolved Hospital Problem List:   ?Aspiration pneumonia, Septic shock, Shock liver, AKI from ATN in setting of sepsis ? ?Assessment & Plan:  ? ?Rheumatoid arthritis, Dermatomyositis ?[place] vs MAS ?- Hematology/Oncology following ?- Primary versus MAS in the setting of connective tissue disease ?- Lab markers (increased IL-2 receptor) ?- Continue dexamethasone (resumed 3/31), pre-med before etoposide ?- Has received 3 doses etoposide per Onc ?- Consider repeat MRI Brain ?- Unclear how well patient is responding to treatment, further [place] discussion needed ?- continue dexomethasone ? ?Febrile Neutropenia ?Candidal fungemia in setting of chronic immunosuppression ?HCAP ?- ID consulted, appreciate recs ?- Cefepime discontinued 4/4 ?- Micafungin x 2-week course (end 4/9) ?- F/u finalized Cx data from 4/1 ? ?Acute metabolic encephalopathy, uncertain etiology ?New onset seizure. ?Hx of anxiety and depression. ?Concern for AI process versus [place] with CNS involvement. ?- RASS goal 0 to -1 ?- Continue Abilify, Lexapro, Klonopin ?- Continue Keppra ?- CT Head 4/6  for new leftward gaze deviation negative, NAICA ?- Spot EEG unremarkable ?- plan is for MRI today prior to meeting 2022/08/01. She is not brain dead. She is able to breath spontaneously.  ? ?Pancytopenia ?Multifactorial in the setting of chemotherapy and [place]. ?- Trend CBC/diff ?- Monitor ANC ?- Neutropenic precautions ?- Transfuse for Hgb < 7.0, Plt < 20 ?- transfuse 1 unit prbc and 1 unt platelets today given bleeding from rectum, suspect internal hemorrhoids causing ABLA ? ?Acute hypoxic respiratory failure from HCAP ?- Continue pressure control vent support ?- Wean FiO2 for O2 sat > 90% ?- Daily WUA/SBT, passing SBT on 4/9, not alert enough for extubation ?- VAP bundle ?- Pulmonary hygiene ?- PAD protocol for sedation: Fentanyl for goal RASS 0 to -1 ?- Intermittent CXR ?- Pending chemo response/extubation trial, needs updated [place] discussion as we approach need for tracheostomy if unable to wean vent/extubate (intubated since 3/17) ? ?Steroid-induced hyperglycemia ?- Semglee ?- increase TF coverage  + SSI ?- CBGs Q4H ? ?Hx of HTN ?Hypervolemia ?Hypernatremia ?- Continue Lopressor ?- increase Free H20 today ?- Monitor I&Os ? ? ?Pressure injury ?Stage 3 R hand - POA, stage 3 sacrum - POA, stage 2 Lt buttock, not present on admission. ?- WOC, appreciate recs ? ?Goals of care ?- Palliative care consulted, appreciate assistance ?- Per last [place] conversation, family would like to continue full scope of aggressive care ?- Upcoming discussion re: possible tracheostomy if  unable to safely extubate ? ?Best Practice (right click and "Reselect all SmartList Selections" daily)  ? ?Diet/type: tubefeeds ?DVT prophylaxis: SCD ?GI prophylaxis: PPI ?Lines: see above ?Foley:  N/A and removal ordered  ?Code Status:  full code ?Last date of multidisciplinary goals of care discussion: I had a conversation 4/8 at bedside with patient's niece and sister. Plan is for goals of care discussion on Monday 11am with oncology and palliative care  present.  ? ?Critical care time: 35 minutes  ? ?The patient is critically ill due to respiratory failure, encephalopathy, pancytopenia.  Critical care was necessary to treat or prevent imminent or life-threa

## 2021-09-14 NOTE — Progress Notes (Signed)
When turning patient, large amount of bright red blood/urine/stool, Dr. Celine Mans notified and came to room while the patient was being cleaned. Noted that a continuous trickle of blood was coming from rectum, likely from hemorrhoid. At one point during cleaning, it was sprayed out of rectum as a large clot. Received new orders from MD to transfuse platelets. VSS as noted on doc flow sheet. ?

## 2021-09-14 NOTE — Progress Notes (Signed)
RT NOTE: patient placed on CPAP/PSV of 12/5 at 0826.  Currently tolerating well at this time.  Will continue to monitor and wean as tolerated.  ?

## 2021-09-14 NOTE — Progress Notes (Signed)
eLink Physician-Brief Progress Note ?Patient Name: Carrie [name] ?DOB: Sep 01, 1960 ?MRN: [id number] ? ? ?Date of Service ? 09/14/2021  ?HPI/Events of Note ? Notified of Hgb 6.4, no report of bleed ?On review patient is pancytopenic ?Seen intubated  ?eICU Interventions ? Ordered to transfuse 1 unit PRBC  ? ? ? ?Intervention Category ?Intermediate Interventions: Other: ? ?[name] [name] ?09/14/2021, 6:22 AM ?

## 2021-09-14 NOTE — Progress Notes (Signed)
Patient transported to MRI and back to room [id number] without complications.  ?

## 2021-09-14 NOTE — Progress Notes (Signed)
RT NOTE: patient was placed back on full support ventilator settings for increased RR and plans for MRI this afternoon.  Tolerating well at this time.  Will continue to monitor.  ?

## 2021-09-15 DIAGNOSIS — D61818 Other pancytopenia: Secondary | ICD-10-CM | POA: Diagnosis not present

## 2021-09-15 DIAGNOSIS — G9349 Other encephalopathy: Secondary | ICD-10-CM | POA: Diagnosis not present

## 2021-09-15 DIAGNOSIS — R109 Unspecified abdominal pain: Secondary | ICD-10-CM | POA: Diagnosis not present

## 2021-09-15 DIAGNOSIS — R4182 Altered mental status, unspecified: Secondary | ICD-10-CM | POA: Diagnosis not present

## 2021-09-15 DIAGNOSIS — R748 Abnormal levels of other serum enzymes: Secondary | ICD-10-CM

## 2021-09-15 DIAGNOSIS — D761 Hemophagocytic lymphohistiocytosis: Secondary | ICD-10-CM | POA: Diagnosis not present

## 2021-09-15 DIAGNOSIS — I10 Essential (primary) hypertension: Secondary | ICD-10-CM | POA: Diagnosis not present

## 2021-09-15 DIAGNOSIS — Z7189 Other specified counseling: Secondary | ICD-10-CM | POA: Diagnosis not present

## 2021-09-15 DIAGNOSIS — G9340 Encephalopathy, unspecified: Secondary | ICD-10-CM | POA: Diagnosis not present

## 2021-09-15 DIAGNOSIS — B377 Candidal sepsis: Secondary | ICD-10-CM | POA: Diagnosis not present

## 2021-09-15 DIAGNOSIS — Z515 Encounter for palliative care: Secondary | ICD-10-CM | POA: Diagnosis not present

## 2021-09-15 DIAGNOSIS — I159 Secondary hypertension, unspecified: Secondary | ICD-10-CM

## 2021-09-15 LAB — CBC
HCT: 18.8 % — ABNORMAL LOW (ref 36.0–46.0)
Hemoglobin: 5.9 g/dL — CL (ref 12.0–15.0)
MCH: 27.8 pg (ref 26.0–34.0)
MCHC: 31.4 g/dL (ref 30.0–36.0)
MCV: 88.7 fL (ref 80.0–100.0)
Platelets: 81 10*3/uL — ABNORMAL LOW (ref 150–400)
RBC: 2.12 MIL/uL — ABNORMAL LOW (ref 3.87–5.11)
RDW: 17.6 % — ABNORMAL HIGH (ref 11.5–15.5)
WBC: 0.2 10*3/uL — CL (ref 4.0–10.5)
nRBC: 0 % (ref 0.0–0.2)

## 2021-09-15 LAB — BPAM PLATELET PHERESIS
Blood Product Expiration Date: 202304112359
ISSUE DATE / TIME: 202304091228
Unit Type and Rh: 6200

## 2021-09-15 LAB — COMPREHENSIVE METABOLIC PANEL
ALT: 94 U/L — ABNORMAL HIGH (ref 0–44)
AST: 64 U/L — ABNORMAL HIGH (ref 15–41)
Albumin: <1.5 g/dL — ABNORMAL LOW (ref 3.5–5.0)
Alkaline Phosphatase: 209 U/L — ABNORMAL HIGH (ref 38–126)
Anion gap: 7 (ref 5–15)
BUN: 105 mg/dL — ABNORMAL HIGH (ref 8–23)
CO2: 24 mmol/L (ref 22–32)
Calcium: 9 mg/dL (ref 8.9–10.3)
Chloride: 117 mmol/L — ABNORMAL HIGH (ref 98–111)
Creatinine, Ser: 0.92 mg/dL (ref 0.44–1.00)
GFR, Estimated: >60 mL/min (ref >60)
Glucose, Bld: 275 mg/dL — ABNORMAL HIGH (ref 70–99)
Potassium: 4.9 mmol/L (ref 3.5–5.1)
Sodium: 148 mmol/L — ABNORMAL HIGH (ref 135–145)
Total Bilirubin: 0.9 mg/dL (ref 0.3–1.2)
Total Protein: 5.6 g/dL — ABNORMAL LOW (ref 6.5–8.1)

## 2021-09-15 LAB — CBC WITH DIFFERENTIAL/PLATELET
Abs Immature Granulocytes: 0 10*3/uL (ref 0.00–0.07)
Abs Immature Granulocytes: 0 10*3/uL (ref 0.00–0.07)
Basophils Absolute: 0 10*3/uL (ref 0.0–0.1)
Basophils Absolute: 0 10*3/uL (ref 0.0–0.1)
Basophils Relative: 0 %
Basophils Relative: 0 %
Eosinophils Absolute: 0 10*3/uL (ref 0.0–0.5)
Eosinophils Absolute: 0 10*3/uL (ref 0.0–0.5)
Eosinophils Relative: 0 %
Eosinophils Relative: 0 %
HCT: 20.2 % — ABNORMAL LOW (ref 36.0–46.0)
HCT: 22.8 % — ABNORMAL LOW (ref 36.0–46.0)
Hemoglobin: 6.4 g/dL — CL (ref 12.0–15.0)
Hemoglobin: 7.5 g/dL — ABNORMAL LOW (ref 12.0–15.0)
Immature Granulocytes: 0 %
Lymphocytes Relative: 100 %
Lymphocytes Relative: 92 %
Lymphs Abs: 0.1 10*3/uL — ABNORMAL LOW (ref 0.7–4.0)
Lymphs Abs: 0.1 10*3/uL — ABNORMAL LOW (ref 0.7–4.0)
MCH: 28.3 pg (ref 26.0–34.0)
MCH: 28.6 pg (ref 26.0–34.0)
MCHC: 31.7 g/dL (ref 30.0–36.0)
MCHC: 32.9 g/dL (ref 30.0–36.0)
MCV: 89.4 fL (ref 80.0–100.0)
MCV: 87 fL (ref 80.0–100.0)
Monocytes Absolute: 0 10*3/uL — ABNORMAL LOW (ref 0.1–1.0)
Monocytes Absolute: 0 10*3/uL — ABNORMAL LOW (ref 0.1–1.0)
Monocytes Relative: 0 %
Monocytes Relative: 8 %
Neutro Abs: 0 10*3/uL — CL (ref 1.7–7.7)
Neutro Abs: 0 10*3/uL — CL (ref 1.7–7.7)
Neutrophils Relative %: 0 %
Neutrophils Relative %: 0 %
Platelets: 21 10*3/uL — CL (ref 150–400)
Platelets: 24 10*3/uL — CL (ref 150–400)
RBC: 2.26 MIL/uL — ABNORMAL LOW (ref 3.87–5.11)
RBC: 2.62 MIL/uL — ABNORMAL LOW (ref 3.87–5.11)
RDW: 18.8 % — ABNORMAL HIGH (ref 11.5–15.5)
RDW: 17.9 % — ABNORMAL HIGH (ref 11.5–15.5)
Smear Review: DECREASED
WBC: 0.1 10*3/uL — CL (ref 4.0–10.5)
WBC: 0.1 10*3/uL — CL (ref 4.0–10.5)
nRBC: 0 % (ref 0.0–0.2)
nRBC: 0 % (ref 0.0–0.2)

## 2021-09-15 LAB — PROTIME-INR
INR: 1.2 (ref 0.8–1.2)
Prothrombin Time: 15.2 seconds (ref 11.4–15.2)

## 2021-09-15 LAB — PREPARE PLATELET PHERESIS: Unit division: 0

## 2021-09-15 LAB — PREPARE RBC (CROSSMATCH)

## 2021-09-15 LAB — FERRITIN: Ferritin: >7500 ng/mL — ABNORMAL HIGH (ref 11–307)

## 2021-09-15 LAB — GLUCOSE, CAPILLARY
Glucose-Capillary: 262 mg/dL — ABNORMAL HIGH (ref 70–99)
Glucose-Capillary: 192 mg/dL — ABNORMAL HIGH (ref 70–99)
Glucose-Capillary: 135 mg/dL — ABNORMAL HIGH (ref 70–99)
Glucose-Capillary: 208 mg/dL — ABNORMAL HIGH (ref 70–99)
Glucose-Capillary: 251 mg/dL — ABNORMAL HIGH (ref 70–99)
Glucose-Capillary: 277 mg/dL — ABNORMAL HIGH (ref 70–99)

## 2021-09-15 LAB — APTT: aPTT: 23 seconds — ABNORMAL LOW (ref 24–36)

## 2021-09-15 LAB — FIBRINOGEN: Fibrinogen: >800 mg/dL — ABNORMAL HIGH (ref 210–475)

## 2021-09-15 MED ORDER — CYCLOSPORINE 100 MG/ML PO SOLN
100.0000 mg | Freq: Two times a day (BID) | ORAL | Status: DC
  Filled 2021-09-15: qty 1

## 2021-09-15 MED ORDER — SODIUM CHLORIDE 0.9% IV SOLUTION
Freq: Once | INTRAVENOUS | Status: DC
Start: 2021-09-15 — End: 2021-09-20

## 2021-09-15 MED ORDER — SULFAMETHOXAZOLE-TRIMETHOPRIM 800-160 MG PO TABS: 1.0000 | ORAL_TABLET | ORAL | Status: DC

## 2021-09-15 MED ORDER — SODIUM CHLORIDE 0.9% IV SOLUTION: Freq: Once | INTRAVENOUS | Status: AC

## 2021-09-15 NOTE — Progress Notes (Signed)
HEMATOLOGY-ONCOLOGY PROGRESS NOTE ? ?ASSESSMENT AND PLAN: ?61 yo female  ?  ?Acute encephalopathy, likely [place] with CNS involvement  ?[place]  ?Pancytopenia secondary to [facility] and chemo  ?Hypertension ?Rheumatoid arthritis/dermatomyositis ?Depression and anxiety ?Stage III sacral ulcer ?Folate deficiency  ?Transaminitis ?Acute hypoxic respiratory failure ?Aspiration pneumonia ?Seizure, resolved ?  ?Recommendations: ?-Labs from today have been reviewed.  She has severe pancytopenia, 3-24 this morning, due to her rectal bleeding, will give platelet transfusion today. Her fibrinogen has improved  which is encouraging, ferritin level still >7.5K so it's unknown if it's coming down ?-We had a family meeting this morning and I participated virtually. I again reviewed his diagnosis and treatment course. Family would like to continue treatment at this point, which I agree. Due to her persistent encephalopathy, I recommend starting intrathecal chemo with methotrexate and steroid this week, and continue weekly for 4 weeks per the [facility] treatment protocol. Per the updated [facility]-2004 call, I will also add on oral cyclosporine,starting tomorrow.  I discussed the benefit and potential side effects, especially the high risk of infection and bleeding from treatment, family agrees with the plan ?-will d/w ID and plan to add on prophylactic Bactrim and Azole  ?-I stopped by in later afternoon to see her and spoke with her family again and answered their questions ?-plan to give 4th dose etoposide tomorrow,restart Granix on 4/12 night, next dose chemo on 4/14 then every Friday for 5 more weeks. ?-will start decrease dexa dose this Friday per treatment protocol ?-IT chemo on  4/13 then weekly for 3 more weeks, will obtain CSF for culture,cell counts and protein etc. I have called IR and spoke with our pharmacy to get it ready  ?-I will f/u on Wednesday  ? ?I spent a total of 60 minutes for her care today. ? ? ?[place]  ? ?SUBJECTIVE: Remains on  the ventilator today.  She had some rectal bleeding yesterday and today, she will be given platelet transfusion.  No other signs of bleeding.  On low-dose sedation, does not respond to voice.  ? ?REVIEW OF SYSTEMS:   ?Review of Systems  ?Reason unable to perform ROS: Sedated, on ventilator.  ? ?PHYSICAL EXAMINATION: ?ECOG PERFORMANCE STATUS: 4 - Bedbound ? ?Vitals:  ? 09/15/21 1945 09/15/21 1958  ?BP: 97/72 97/72  ?Pulse: (!) 107 (!) 116  ?Resp: 18 18  ?Temp:    ?SpO2: 95% 98%  ? ?Filed Weights  ? 09/09/21 0500 09/12/21 0500 09/14/21 0500  ?Weight: 151 lb 7.3 oz (68.7 kg) 158 lb 8.2 oz (71.9 kg) 157 lb 13.6 oz (71.6 kg)  ? ? ?Intake/Output from previous day: ?04/09 0701 - 04/10 0700 ?In: 3981.1 [I.V.:283.1; Blood:564; [id number]; IV [id number] ?Out: 1500 [Urine:1500] ? ?Physical Exam ?Vitals reviewed.  ?Constitutional:   ?   Comments: On ventilator.  Eyes open but does not respond to me.  Generalized edema noted.  ?HENT:  ?   Head: Normocephalic.  ?Musculoskeletal:  ?   Right lower leg: Edema present.  ?   Left lower leg: Edema present.  ?Neurological:  ?   Comments: Sedated.  ? ? ?LABORATORY DATA:  ?I have reviewed the data as listed ? ?  Latest Ref Rng & Units 09/15/2021  ?  2:46 AM 09/14/2021  ?  4:38 AM 09/13/2021  ?  3:29 AM  ?CMP  ?Glucose 70 - 99 mg/dL 275   178   107    ?BUN 8 - 23 mg/dL 105   106   117    ?  Creatinine 0.44 - 1.00 mg/dL 0.92   0.92   0.91    ?Sodium 135 - 145 mmol/L 148   152   152    ?Potassium 3.5 - 5.1 mmol/L 4.9   4.5   5.1    ?Chloride 98 - 111 mmol/L 117   120   117    ?CO2 22 - 32 mmol/L 24   25   28     ?Calcium 8.9 - 10.3 mg/dL 9.0   9.1   9.2    ?Total Protein 6.5 - 8.1 g/dL 5.6   5.3   5.5    ?Total Bilirubin 0.3 - 1.2 mg/dL 0.9   1.0   0.7    ?Alkaline Phos 38 - 126 U/L 209   228   305    ?AST 15 - 41 U/L 64   64   93    ?ALT 0 - 44 U/L 94   103   128    ? ? ?Lab Results  ?Component Value Date  ? WBC 0.1 (LL) 09/15/2021  ? HGB 7.5 (L) 09/15/2021  ? HCT 22.8 (L) 09/15/2021  ? MCV  87.0 09/15/2021  ? PLT 24 (LL) 09/15/2021  ? NEUTROABS 0.0 (LL) 09/15/2021  ? ? ?Lab Results  ?Component Value Date  ? CEA1 36.4 (H) 08/28/2021  ? ? ?CT HEAD WO CONTRAST (5MM) ? ?Result Date: 09/11/2021 ?CLINICAL DATA:  Altered mental status. EXAM: CT HEAD WITHOUT CONTRAST TECHNIQUE: Contiguous axial images were obtained from the base of the skull through the vertex without intravenous contrast. RADIATION DOSE REDUCTION: This exam was performed according to the departmental dose-optimization program which includes automated exposure control, adjustment of the mA and/or kV according to patient size and/or use of iterative reconstruction technique. COMPARISON:  September 05, 2021. FINDINGS: Brain: Mild chronic ischemic white matter disease is noted. No mass effect or midline shift is noted. Ventricular size is within normal limits. There is no evidence of mass lesion, hemorrhage or acute infarction. Vascular: No hyperdense vessel or unexpected calcification. Skull: Normal. Negative for fracture or focal lesion. Sinuses/Orbits: Mild bilateral maxillary mucosal thickening is noted. Other: None. IMPRESSION: No acute intracranial abnormality seen. Electronically Signed   By: Marijo Conception M.D.   On: 09/11/2021 11:31  ? ?CT HEAD WO CONTRAST (5MM) ? ?Result Date: 09/05/2021 ?CLINICAL DATA:  Altered mental status. EXAM: CT HEAD WITHOUT CONTRAST TECHNIQUE: Contiguous axial images were obtained from the base of the skull through the vertex without intravenous contrast. RADIATION DOSE REDUCTION: This exam was performed according to the departmental dose-optimization program which includes automated exposure control, adjustment of the mA and/or kV according to patient size and/or use of iterative reconstruction technique. COMPARISON:  August 17, 2021. FINDINGS: Brain: Mild diffuse cortical atrophy is noted. No mass effect or midline shift is noted. Ventricular size is within normal limits. There is no evidence of mass lesion,  hemorrhage or acute infarction. Vascular: No hyperdense vessel or unexpected calcification. Skull: Normal. Negative for fracture or focal lesion. Sinuses/Orbits: Left frontal, ethmoid and maxillary sinusitis is noted. Other: Fluid is noted in the mastoid air cells bilaterally. IMPRESSION: No acute intracranial abnormality seen. Electronically Signed   By: Marijo Conception M.D.   On: 09/05/2021 14:37  ? ?CT HEAD WO CONTRAST (5MM) ? ?Result Date: 08/19/2021 ?CLINICAL DATA:  61 year old female with history of delirium. Lower abdominal pain and constipation. EXAM: CT HEAD WITHOUT CONTRAST TECHNIQUE: Contiguous axial images were obtained from the base of the skull through the  vertex without intravenous contrast. RADIATION DOSE REDUCTION: This exam was performed according to the departmental dose-optimization program which includes automated exposure control, adjustment of the mA and/or kV according to patient size and/or use of iterative reconstruction technique. COMPARISON:  Head CT 08/06/2021. FINDINGS: Brain: Mild cerebral atrophy. Patchy and confluent areas of decreased attenuation are noted throughout the deep and periventricular white matter of the cerebral hemispheres bilaterally, compatible with chronic microvascular ischemic disease. No evidence of acute infarction, hemorrhage, hydrocephalus, extra-axial collection or mass lesion/mass effect. Vascular: No hyperdense vessel or unexpected calcification. Skull: Normal. Negative for fracture or focal lesion. Sinuses/Orbits: No acute finding. Other: None. IMPRESSION: 1. No acute intracranial abnormalities. 2. Mild cerebral atrophy with chronic microvascular ischemic changes in the cerebral white matter, as above. Electronically Signed   By: Vinnie Langton M.D.   On: 08/27/2021 06:31  ? ?CT ABDOMEN W CONTRAST ? ?Result Date: 09/03/2021 ?CLINICAL DATA:  Candidiasis.  Evaluate for intra-abdominal abscess. EXAM: CT ABDOMEN WITH CONTRAST TECHNIQUE: Multidetector CT imaging  of the abdomen was performed using the standard protocol following bolus administration of intravenous contrast. RADIATION DOSE REDUCTION: This exam was performed according to the departmental dose-optimization

## 2021-09-15 NOTE — Progress Notes (Signed)
? ?NAME:  Carrie [name], MRN:  [id number], DOB:  02-08-1961, LOS: 28 ?ADMISSION DATE:  08/31/2021, CONSULTATION DATE:  08/22/2021 ?REFERRING MD:  Darrick Meigs, CHIEF COMPLAINT:  Confusion  ? ?History of Present Illness:  ?61 year old woman with history of dermatomyositis and rheumatoid arthritis (on immunosuppressive therapy) who presented 3/12 with AMS, abdominal pain, constipation and volume depletion.  Found to have pancytopenia; seen by Hematology and initially felt to be related to mycophenolate/steroid therapy.  BMBx showed hypercellular marrow w/o blasts or malignant cells.  She developed worsening encephalopathy on 3/16 and Neuro was consulted.  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 ? ?On 3/17, patient had new onset seizure and required intubation for airway protection. PCCM consulted. She was transferred from [facility] to [facility] for LTM. Later felt to have Mabie and started on dexamethasone and etoposide. Course complicated by Candidal fungemia likely from PICC line. ? ?Pertinent Medical History:  ?Rheumatoid arthritis, Dermatomyositis, Fatty liver, HLD, HTN, Depression, Anxiety ? 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 ?3/26 Candida fungemia for which started on micafungin. Dex and Etoposide stopped. [place] discussion with family - palliative care consulted  ?3/28 Febrile overnight for which cefepime added. WBC dropped to 1.4; started on Granix inj. ?3/31 restart dexamethasone ?4/01 Bronch/BAL ?4/03 restart etoposide ?4/04 change to pressure control to improve vent  synchrony ?4/05 Better tolerating pressure control. Increased peripheral edema. ?4/06 Continues to tolerate pressure control. ANC dropping, 100 today. Placed on Neutropenic Precautions. Ongoing peripheral edema. Nearing tracheostomy discussion. ?4/07 Tolerating pressure control.  Slightly more awake with weaned sedation, biting ETT with suctioning, withdrawing all 4 extremities to pain.  Per Onc, re-dose etoposide today.  Ongoing discussion re: extubation trial versus tracheostomy. ? ?Studies:  ?03/2021 labs >> ANA positive, Jo-1 neg, centromere neg, ds DNA neg, RNP positive, SSA/SSB neg, RF positive, chromatin Ab neg, CCP neg ?MRI 3/13 >> no acute findings ?3/17 bone marrow biopsy >> hypercellular bone marrow with trilineage hematopoiesis, comment: non-specific, follow up cytogenetic studies ?3/12 CT Abdomen and pelvis >> no acute findings, adrenal nodule, diverticulosis ?3/12 CT Head without contrast >> no acute findings ?3/16 EEG >> moderate diffuse encephalopathy of nonspecific etiology, no seizures or epileptiform discharges noted during the recording ?3/19 Echo >> EF 60 to 65%, mild LVH, grade 1 DD, mild/mod TR ?3/29 CT Abd >> b/l ASD, small GB stone, diffuse subcutaneous edema ?3/31 CT Head >> mild cortical atrophy ?4/06 CT Head >> NAICA ?4/09 MRI >> No acute or focal finding.  Generalized brain atrophy. Bilateral mastoid effusions. ? ?Micro:  ?3/16 ParvoB19 IgG pos, IgM neg ?3/16 EBV IgG pos, IgM neg ?3/16 CMV IgG pos, IgM neg ?3/16 HHV6 IgG pos ?3/17 Bone marrow biopsy > hypercellular with trilineage hematopoiesis ?3/23 Blood culture 1/4 > Candida glabrata ?4/01 BAL > (WBC/PMN and mononuclear), gram variable rods, gram positive cocci, > few normal resp flora, no staph/PsA ?4/01 PCP > ?4/01 BAL AFB > Negative ?4/01 BAL Fungus > NGTD ? ?Antibiotics:  ?Rocephin 3/12 ?Acyclovir 3/16>3/19  ?Unasyn 3/19 > 3/24 ?Micafungin 3/24 >  ?Cefepime 3/28 > 4/4, resumed 4/7.  ? ?Interim History / Subjective:  ?No  significant events overnight ?Tolerating PSV, still not following commands ?Loose stools slowed ?Per Onc, plan for chemo tomorrow 4/11 ?Family meeting with PMT, CCM, Onc for [place] today ? ?Objective:  ?Blood pressure 106/76, pulse 81, temperature 98.6 ?F (37 ?C), temperature source Axillary, resp. rate 18, height _0  (1.6 m), weight 71.6 kg, SpO2 100 %. ?   ?Vent Mode: PCV ?FiO2 (%):  [40 %] 40 % ?Set Rate:  [18 bmp] 18 bmp ?PEEP:  [5 cmH20] 5 cmH20 ?Pressure Support:  [12 cmH20] 12 cmH20 ?Plateau Pressure:  [18 cmH20-19 cmH20] 18 cmH20  ? ?Intake/Output Summary (Last 24 hours) at 09/15/2021 0723 ?Last data filed at 09/15/2021 0600 ?Gross per 24 hour  ?Intake 3981.12 ml  ?Output 1500 ml  ?Net 2481.12 ml  ? ? ?Filed Weights  ? 09/09/21 0500 09/12/21 0500 09/14/21 0500  ?Weight: 68.7 kg 71.9 kg 71.6 kg  ? ?Physical Examination: ?General: Acutely ill-appearing middle-aged woman in NAD. Mildly agitated with stimulation. ?HEENT: [place]/AT, anicteric sclera, PERRL, dry mucous membranes. ETT/OGT in place. ?Neuro: Sedated. Slightly agitated with stimulation, some localization to pain, no other purposeful movement observed. Withdraws to pain in all 4 extremities. Not following commands. Moves all 4 extremities spontaneously. +Corneal, +Cough, and +Gag  ?CV: RRR, no m/g/r. ?PULM: Breathing even and unlabored on vent (PSV 8/5, FiO2 40%). Lung fields coarse in upper field bilaterally. ?GI: Soft, nontender, nondistended. Normoactive bowel sounds. ?Extremities: Bilateral symmetric 1+ LE edema noted. Improved bilateral symmetric UE edema, 1-2+, nonpitting. ?Skin: Warm/dry, no rashes. Scattered ecchymosis over BUE, anterior neck. ? ?Resolved Hospital Problem List:   ?Aspiration pneumonia, Septic shock, Shock liver, AKI from ATN in setting of sepsis ? ?Assessment & Plan:  ? ?Rheumatoid arthritis, Dermatomyositis ?[place] vs. MAS ?- Hematology/Oncology following ?- Primary versus MAS in the setting of connective tissue disease ?- Lab markers  (increased IL-2 receptor) ?- Continue dexamethasone, pre-medicate prior to etoposide ?- Has received 3 doses of etoposide per Onc, next 4/11 ?- Repeat MRI Brain completed 4/9, NAICA ?- Unclear how well patient is responding to [facility] treatment ?- Plan for family meeting/GOC discussion 4/10 with family, PMT, CCM, Oncology ? ?Febrile Neutropenia ?Candidal fungemia in setting of chronic immunosuppression ?HCAP ?- ID consulted, appreciate recommendations ?- Cefepime discontinued 4/4 ?- S/p micafungin x 2-week course (ended 4/9) ?- F/u remaining finalized Cx data 4/1 ? ?Acute metabolic encephalopathy, uncertain etiology ?New onset seizure. ?Hx of anxiety and depression. ?Concern for AI process versus [place] with CNS involvement. ?- RASS goal 0 to -1 ?- Continue Abilify, Lexapro, Klonopin ?- Continue Keppra ?- Brain imaging/EEG unremarkable ?- MRI Brain 4/9 without acute abnormalities ?- GOC discussion/family meeting today, 4/10 ? ?Pancytopenia ?Multifactorial in the setting of chemotherapy and [place]. ?- Trend CBC/diff ?- Monitor ANC, undetectable 4/10AM ?- Neutropenic precautions ?- Transfuse for Hgb < 7.0, Plt < 20 or hemodynamically significant bleeding (last 1U PRBC/1U Plt 4/9 for rectal bleeding) ?- Will likely need additional Plt transfusion prior to/during tracheostomy placement, should family continue to want aggressive care ? ?Acute hypoxic respiratory failure from HCAP ?- Continue PSV, tolerating well; increased vent dyssynchrony with PRVC ?- Wean FiO2 for O2 sat > 90% ?- Daily WUA/SBT; not alert enough for safe extubation despite tolerating extended periods of vent weaning/PSV ?- VAP bundle ?- Pulmonary hygiene ?- PAD protocol for sedation: Fentanyl for goal RASS 0 to -1 ?- Intermittent CXR ?- Likely need for tracheostomy this week, pending GOC discussion today 6/38; complicated by low Plt count/bleeding risk ? ?Steroid-induced hyperglycemia ?- Semglee ?-  TF coverage, SSI (resistant scale) ?- CBGs Q4H ? ?Hx of  HTN ?Hypervolemia ?Hypernatremia ?- Lopressor ?- FWF ?- Monitor I&Os ? ?Pressure injury ?Stage 3 R hand - POA, stage 3 sacrum - POA, stage 2 Lt buttock, not present on admission. ?- WOC, appreciate recommendations ? ?Goals

## 2021-09-15 NOTE — Progress Notes (Signed)
Palliative: ? ?I met today at [facility] bedside along with Dr. Shearon Stalls, Dr Annamaria Boots and son Carrie [name] via telephone, niece/HCPOA Carrie [name], sister Carrie [name], and friend Carrie [name] (NP). Discussed was had about Carrie [name]'s course and that she has not exhibited significant decline but has not really shown much improvement at the same time. Dr. Annamaria Boots shared recommendations for medical treatment plan moving forward to treat Carrie [name]. Everyone agrees with plan and understands that prognosis is poor but they agree that Carrie [name] would want to have any opportunity available to improve. Everyone was allowed time to ask questions and absorb information.  ? ?I met longer with Carrie [name] and Carrie [name] at bedside. They share with me more about Carrie [name] and how she is typically fiercely independent but was being more open to allowing them information regarding her health during her decline prior to admission. They report that she completed a Living Will and named her son and Carrie [name] but she did not share this information with them at the time and they have recently learned this information. Discussed with Carrie [name] who plans to bring Living Will so we can make copy to place on shadow chart to have on file. They are realistic and although they are hopeful they understand that if she declines further options may be limited and they would be open to more comfort focused care as they do not want her to suffer. Carrie [name] expresses that she had felt that the medical team would be leaning them away from proceeding with tracheostomy and I explained that I believe they are supportive of moving forward because there are still new options from oncology for treatment that could make a difference. I encouraged them to continue conversations as a family. I also encouraged ongoing conversation regarding our plans if she were to worsen instead of improve - including code status. They understand and Carrie [name] plans to speak further with Carrie [name] about code status and the nuances of a visit.  ? ?All  questions/concerns addressed. Emotional support provided.  ? ?Exam: Unresponsive on vent. No distress. Tolerating weaning mode (but still with poor neurological status). Breathing regular, unlabored on vent. Abd soft. Warm to touch.  ? ?Plan: ?- Ongoing goals of care conversations.  ?- Family agree with proceeding with tracheostomy and intrathecal chemotherapy as recommended.  ? ?75 min ? ?[name], NP ?Palliative Medicine Team ?Pager [phone] (Please see [email or web address] for schedule) ?Team Phone [phone]  ? ? ?Greater than 50%  of this time was spent counseling and coordinating care related to the above assessment and plan   ?

## 2021-09-15 NOTE — Progress Notes (Addendum)
eLink Physician-Brief Progress Note ?Patient Name: Carrie CoxLivia [name]  ?DOB: 01/14/1961 ?MRN: [id number] ? ? ?Date of Service ? 09/15/2021  ?HPI/Events of Note ? Nursing reports active bleeding from rectum. Hgb =5.9 and Platelets = 81.  ?eICU Interventions ? Plan: ?Transfuse 2 units PRVC and 1 unit platelets now.  ?PT/INR and PTT STAT.   ? ? ? ?Intervention Category ?Major Interventions: Hemorrhage - evaluation and management ? ?Juliette Standre Dennard NipEugene ?09/15/2021, 9:25 PM ?

## 2021-09-15 NOTE — Progress Notes (Signed)
Pharmacy Antibiotic Note ? ?Carrie [name] is a 61 y.o. female admitted on 09/03/2021 with aspiration pneumonia. Pt is on active chemotherapy. She has gotten several courses of abx. Restarted cefepime for febrile neutropenia on 4/7. ? ?Wbc 0.1, ANC undetectable ?Scr stable <1 ? ?Plan: ?Cefepime 2g IV q8h ?Monitor clinical progress, c/s, renal function ?F/u LOT ? ? ?Height: 5\' 3"  (160 cm) ?Weight: 71.6 kg (157 lb 13.6 oz) ?IBW/kg (Calculated) : 52.4 ? ?Temp (24hrs), Avg:99.3 ?F (37.4 ?C), Min:98.5 ?F (36.9 ?C), Max:100.5 ?F (38.1 ?C) ? ?Recent Labs  ?Lab 09/11/21 ?2863 09/12/21 ?0650 09/13/21 ?0329 09/14/21 ?8177 09/14/21 ?1729 09/15/21 ?0246  ?WBC 0.3* 0.2* 0.2* 0.1* <0.1* 0.1*  ?CREATININE 0.98 0.97 0.91 0.92  --  0.92  ? ?  ?Estimated Creatinine Clearance: 60.2 mL/min (by C-G formula based on SCr of 0.92 mg/dL).   ? ?Allergies  ?Allergen Reactions  ? Zoloft [Sertraline Hcl] Diarrhea and Other (See Comments)  ?  Stomach upset ?  ? ?Acyclovir 3/16>3/19 ?Rocephin 3/12 x 1, 3/16 x 1 ?Unasyn 3/20 > 3/24  ?Micafungin 3/24>> (4/9 - per ID note 4/4) ?Cefepime 3/28>>4/4, resumed 4/7 >> ? ?3/12 BCx: 1 of 3 bottles Staph auricularis F. contaminant ?3/16 BCx: Negative ?3/23 BCx: C glabrata ?3/26 BCx: Neg ?3/31 TA - negative ?4/01 TA - negative ?4/1 fungus - ngtd ?CT ab neg - r/o concern for disseminated inf/spleen ? ? ?Leia Alf, PharmD, BCPS ?Please check AMION for all [facility] contact numbers ?Clinical Pharmacist ?09/15/2021 11:04 AM ? ?

## 2021-09-15 NOTE — Progress Notes (Signed)
Turned pt to find large blood clot on bed pad and frank red blood draining from rectum continuously.  Dr. Celine Mans notified and ordered 1 unit platelets. ?

## 2021-09-16 ENCOUNTER — Inpatient Hospital Stay (HOSPITAL_COMMUNITY): Payer: Federal, State, Local not specified - PPO

## 2021-09-16 ENCOUNTER — Inpatient Hospital Stay: Payer: Self-pay

## 2021-09-16 DIAGNOSIS — S61401S Unspecified open wound of right hand, sequela: Secondary | ICD-10-CM

## 2021-09-16 DIAGNOSIS — G9340 Encephalopathy, unspecified: Secondary | ICD-10-CM | POA: Diagnosis not present

## 2021-09-16 DIAGNOSIS — D701 Agranulocytosis secondary to cancer chemotherapy: Secondary | ICD-10-CM | POA: Diagnosis not present

## 2021-09-16 DIAGNOSIS — B377 Candidal sepsis: Secondary | ICD-10-CM | POA: Diagnosis not present

## 2021-09-16 DIAGNOSIS — R4182 Altered mental status, unspecified: Secondary | ICD-10-CM | POA: Diagnosis not present

## 2021-09-16 LAB — BPAM PLATELET PHERESIS
Blood Product Expiration Date: 202304112359
Blood Product Expiration Date: 202304132359
ISSUE DATE / TIME: 202304101615
ISSUE DATE / TIME: 202304102200
Unit Type and Rh: 1700
Unit Type and Rh: 7300

## 2021-09-16 LAB — GLUCOSE, CAPILLARY
Glucose-Capillary: 183 mg/dL — ABNORMAL HIGH (ref 70–99)
Glucose-Capillary: 143 mg/dL — ABNORMAL HIGH (ref 70–99)
Glucose-Capillary: 72 mg/dL (ref 70–99)
Glucose-Capillary: 79 mg/dL (ref 70–99)
Glucose-Capillary: 109 mg/dL — ABNORMAL HIGH (ref 70–99)
Glucose-Capillary: 187 mg/dL — ABNORMAL HIGH (ref 70–99)

## 2021-09-16 LAB — COMPREHENSIVE METABOLIC PANEL
ALT: 441 U/L — ABNORMAL HIGH (ref 0–44)
AST: 430 U/L — ABNORMAL HIGH (ref 15–41)
Albumin: <1.5 g/dL — ABNORMAL LOW (ref 3.5–5.0)
Alkaline Phosphatase: 488 U/L — ABNORMAL HIGH (ref 38–126)
Anion gap: 7 (ref 5–15)
BUN: 112 mg/dL — ABNORMAL HIGH (ref 8–23)
CO2: 22 mmol/L (ref 22–32)
Calcium: 8.5 mg/dL — ABNORMAL LOW (ref 8.9–10.3)
Chloride: 118 mmol/L — ABNORMAL HIGH (ref 98–111)
Creatinine, Ser: 0.93 mg/dL (ref 0.44–1.00)
GFR, Estimated: >60 mL/min (ref >60)
Glucose, Bld: 207 mg/dL — ABNORMAL HIGH (ref 70–99)
Potassium: 4.6 mmol/L (ref 3.5–5.1)
Sodium: 147 mmol/L — ABNORMAL HIGH (ref 135–145)
Total Bilirubin: 1.7 mg/dL — ABNORMAL HIGH (ref 0.3–1.2)
Total Protein: 4.5 g/dL — ABNORMAL LOW (ref 6.5–8.1)

## 2021-09-16 LAB — PREPARE PLATELET PHERESIS
Unit division: 0
Unit division: 0

## 2021-09-16 LAB — CBC WITH DIFFERENTIAL/PLATELET
Abs Immature Granulocytes: 0 10*3/uL (ref 0.00–0.07)
Basophils Absolute: 0 10*3/uL (ref 0.0–0.1)
Basophils Relative: 0 %
Eosinophils Absolute: 0 10*3/uL (ref 0.0–0.5)
Eosinophils Relative: 0 %
HCT: 23.6 % — ABNORMAL LOW (ref 36.0–46.0)
Hemoglobin: 7.7 g/dL — ABNORMAL LOW (ref 12.0–15.0)
Immature Granulocytes: 0 %
Lymphocytes Relative: 93 %
Lymphs Abs: 0.1 10*3/uL — ABNORMAL LOW (ref 0.7–4.0)
MCH: 29.4 pg (ref 26.0–34.0)
MCHC: 32.6 g/dL (ref 30.0–36.0)
MCV: 90.1 fL (ref 80.0–100.0)
Monocytes Absolute: 0 10*3/uL — ABNORMAL LOW (ref 0.1–1.0)
Monocytes Relative: 0 %
Neutro Abs: 0 10*3/uL — CL (ref 1.7–7.7)
Neutrophils Relative %: 7 %
Platelets: 77 10*3/uL — ABNORMAL LOW (ref 150–400)
RBC: 2.62 MIL/uL — ABNORMAL LOW (ref 3.87–5.11)
RDW: 16.1 % — ABNORMAL HIGH (ref 11.5–15.5)
WBC: 0.2 10*3/uL — CL (ref 4.0–10.5)
nRBC: 0 % (ref 0.0–0.2)

## 2021-09-16 IMAGING — DX DG CHEST 1V PORT
1 series · 1 of 1 positions shown · non-contrast
Comparison: Radiograph dated [DATE]

CLINICAL DATA: Status post tracheostomy

EXAM:
PORTABLE CHEST 1 VIEW

[chest]
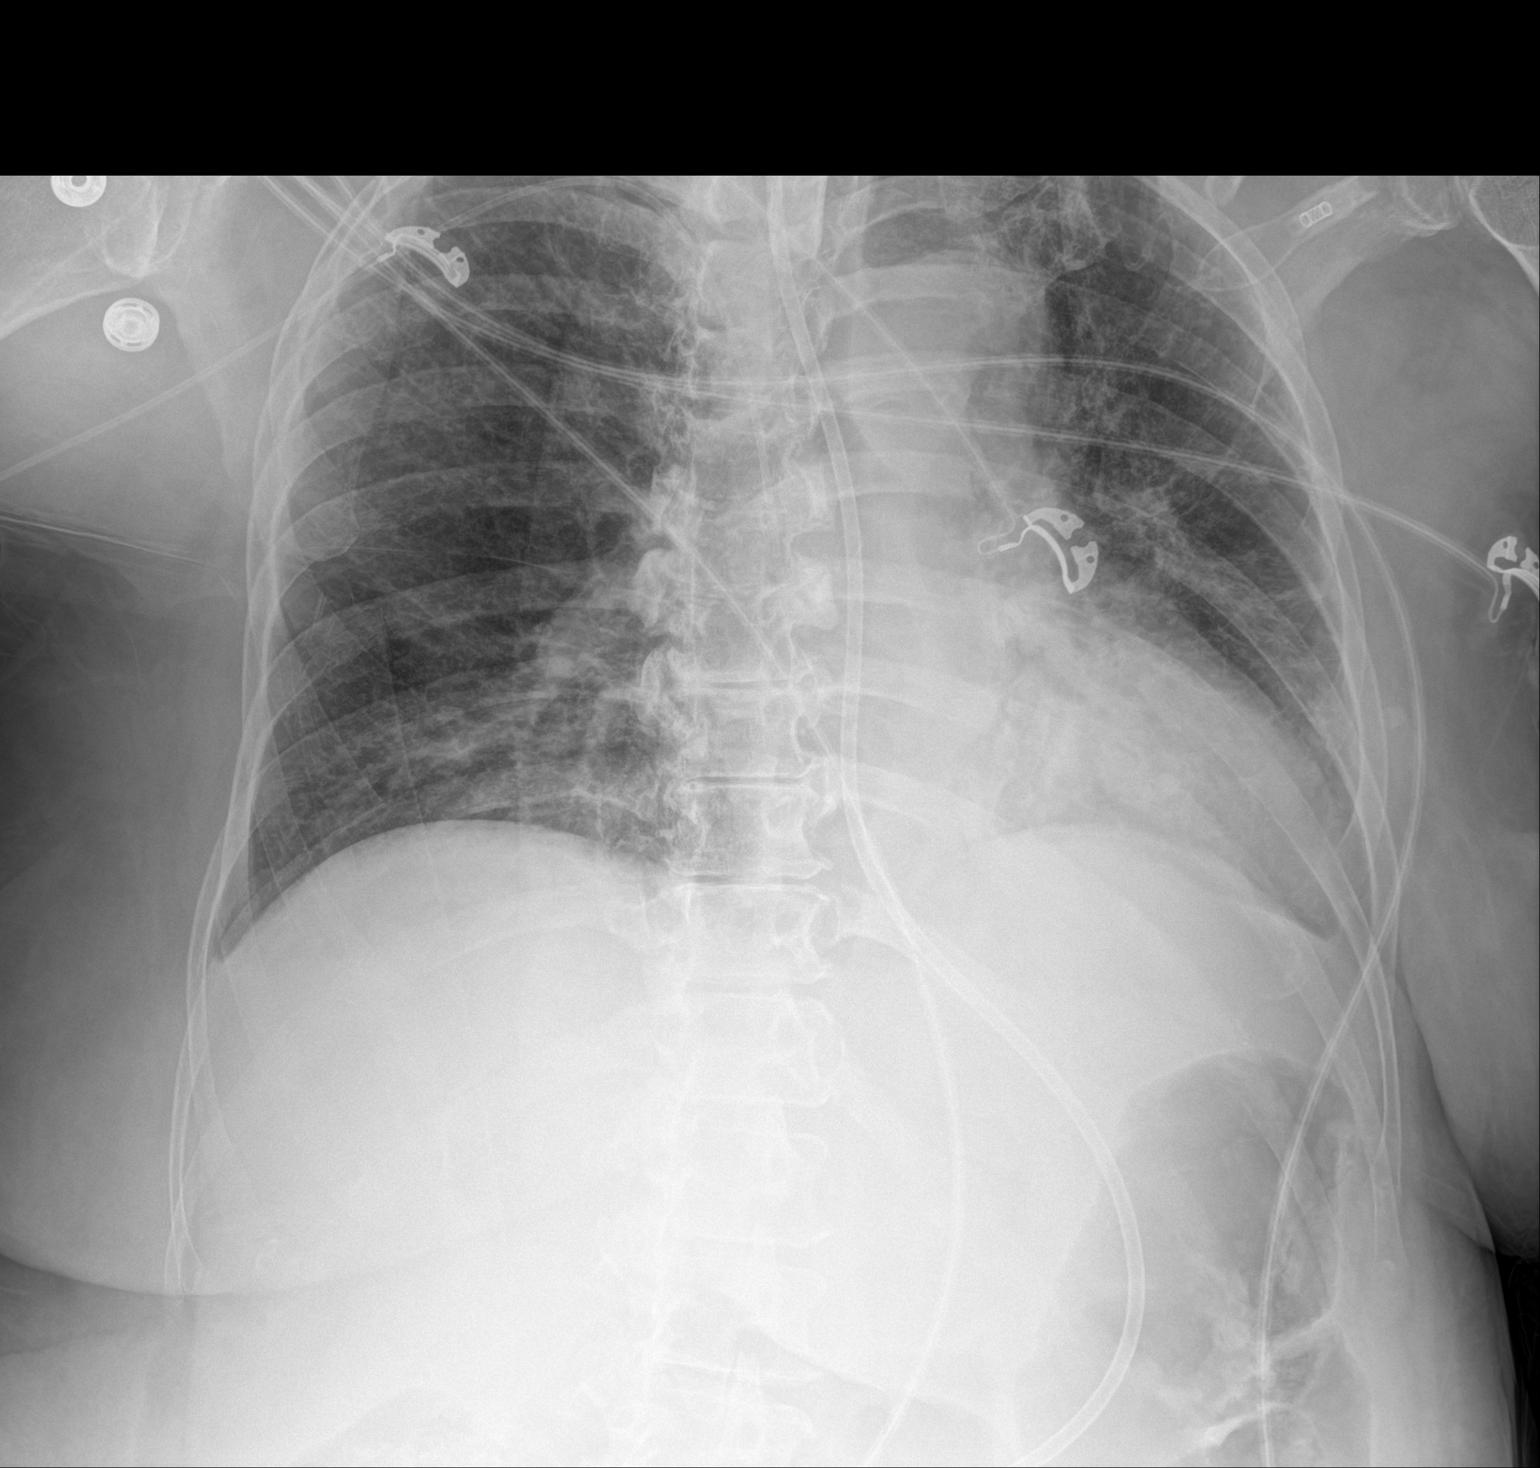

[1 of 1 positions shown; findings below may reference images not displayed]

FINDINGS: The heart is enlarged. Interval interval placement of tracheostomy
tube in satisfactory position. Feeding tube coursing below the
diaphragm with distal tip not included.

Interval decrease in bilateral hazy lung opacities, left greater
than the right representing resolving infectious/inflammatory
process. No appreciable pleural effusion.
IMPRESSION: 1. Interval placement of tracheostomy tube in satisfactory position.
2. Improvement of bilateral lung opacities, representing resolving
infectious/inflammatory process. No appreciable pleural effusion or
pneumothorax.

## 2021-09-16 MED ORDER — ROCURONIUM BROMIDE 10 MG/ML (PF) SYRINGE
PREFILLED_SYRINGE | INTRAVENOUS | Status: AC
  Filled 2021-09-16: qty 10

## 2021-09-16 MED ORDER — ETOMIDATE 2 MG/ML IV SOLN
20.0000 mg | Freq: Once | INTRAVENOUS | Status: AC
  Administered 2021-09-16: 20 mg via INTRAVENOUS

## 2021-09-16 MED ORDER — CYCLOSPORINE 100 MG/ML PO SOLN
100.0000 mg | Freq: Two times a day (BID) | ORAL | Status: DC
  Administered 2021-09-16 – 2021-09-21 (×12): 100 mg
  Filled 2021-09-16 (×13): qty 1

## 2021-09-16 MED ORDER — ROCURONIUM BROMIDE 50 MG/5ML IV SOLN
100.0000 mg | Freq: Once | INTRAVENOUS | Status: AC
  Administered 2021-09-16: 100 mg via INTRAVENOUS
  Filled 2021-09-16: qty 10

## 2021-09-16 MED ORDER — ETOMIDATE 2 MG/ML IV SOLN
INTRAVENOUS | Status: AC
  Filled 2021-09-16: qty 20

## 2021-09-16 MED ORDER — SODIUM CHLORIDE 0.9 % IV SOLN
100.0000 mg/m2 | Freq: Once | INTRAVENOUS | Status: AC
  Administered 2021-09-16: 180 mg via INTRAVENOUS
  Filled 2021-09-16: qty 9

## 2021-09-16 MED ORDER — PROCHLORPERAZINE EDISYLATE 10 MG/2ML IJ SOLN
10.0000 mg | Freq: Once | INTRAMUSCULAR | Status: AC
  Administered 2021-09-16: 10 mg via INTRAVENOUS
  Filled 2021-09-16: qty 2

## 2021-09-16 MED ORDER — SODIUM CHLORIDE 0.9% FLUSH: 10.0000 mL | INTRAVENOUS | Status: DC | PRN

## 2021-09-16 MED ORDER — SODIUM CHLORIDE 0.9 % IV SOLN: 150.0000 mg/m2 | Freq: Once | INTRAVENOUS | Status: DC

## 2021-09-16 MED ORDER — VORICONAZOLE 50 MG PO TABS
100.0000 mg | ORAL_TABLET | Freq: Two times a day (BID) | ORAL | Status: DC
  Administered 2021-09-16 – 2021-09-21 (×12): 100 mg
  Filled 2021-09-16 (×14): qty 2

## 2021-09-16 MED ORDER — SULFAMETHOXAZOLE-TRIMETHOPRIM 800-160 MG PO TABS
1.0000 | ORAL_TABLET | ORAL | Status: DC
  Administered 2021-09-17 – 2021-09-19 (×2): 1
  Filled 2021-09-16 (×3): qty 1

## 2021-09-16 MED ORDER — SODIUM CHLORIDE 0.9% FLUSH
10.0000 mL | Freq: Two times a day (BID) | INTRAVENOUS | Status: DC
  Administered 2021-09-16: 10 mL
  Administered 2021-09-16: 30 mL
  Administered 2021-09-17 (×2): 10 mL
  Administered 2021-09-18: 30 mL
  Administered 2021-09-18 – 2021-09-19 (×2): 10 mL
  Administered 2021-09-19 – 2021-09-20 (×2): 30 mL
  Administered 2021-09-20: 20 mL
  Administered 2021-09-21 (×2): 10 mL

## 2021-09-16 NOTE — Procedures (Signed)
Bedside Tracheostomy Insertion Procedure Note ? ? ?Patient Details:   ?Name: Carrie CoxLivia [name]  ?DOB: 08/03/1960 ?MRN: [id number] ? ?Procedure: Tracheostomy ? ?Pre Procedure Assessment: ?ET Tube Size: ?ET Tube secured at lip (cm): ?Bite block in place: No ?Breath Sounds: Diminished and Rhonch ? ?Post Procedure Assessment: ?BP 102/74   Pulse 88   Temp 99.1 ?F (37.3 ?C) (Axillary)   Resp (!) 24   Ht 5\' 3"  (1.6 m)   Wt 73.9 kg   SpO2 99%   BMI 28.86 kg/m?  ?O2 sats: stable throughout ?Complications: No apparent complications ?Patient did tolerate procedure well ?Tracheostomy Brand:Shiley ?Tracheostomy Style:Cuffed ?Tracheostomy Size:  6 ?Tracheostomy Secured WUJ:[id number]:Sutures ?Tracheostomy Placement Confirmation:Trach cuff visualized and in place and Chest X ray ordered for placement ? ? ? ?Carrie [name] ?09/16/2021, 3:02 PM ? ?    ? ? ?

## 2021-09-16 NOTE — Progress Notes (Signed)
Inpatient Diabetes Program Recommendations ? ?AACE/ADA: New Consensus Statement on Inpatient Glycemic Control (2015) ? ?Target Ranges:  Prepandial:   less than 140 mg/dL ?     Peak postprandial:   less than 180 mg/dL (1-2 hours) ?     Critically ill patients:  140 - 180 mg/dL  ? ?Lab Results  ?Component Value Date  ? GLUCAP 143 (H) 09/16/2021  ? HGBA1C 6.2 (H) 08/29/2021  ? ? ?Review of Glycemic Control ? Latest Reference Range & Units 09/15/21 15:31 09/15/21 20:06 09/15/21 23:07 09/16/21 03:12 09/16/21 07:45  ?Glucose-Capillary 70 - 99 mg/dL 208 (H) 251 (H) 277 (H) 183 (H) 143 (H)  ? ?Diabetes history: None noted ?Outpatient Diabetes medications: None ?Current orders for Inpatient glycemic control:  ?Semglee 20 units daily ?Vital 60 ml/hr ?Novolog 6 units q 4 hours ?Novolog 0-20 units q 4 hours ?Decadron 20 mg IV daily ?Inpatient Diabetes Program Recommendations:   ? ?Consider increasing Novolog tube feed coverage to 7 units q 4 hours.  ? ?Thanks,  ?Adah Perl, RN, BC-ADM ?Inpatient Diabetes Coordinator ?Pager [phone]  (8a-5p) ? ? ? ?

## 2021-09-16 NOTE — Procedures (Signed)
Diagnostic Bronchoscopy ? ?Carrie [name]   ?[id number]  ?10/01/1960 ? ?Date:09/16/21  ?Time:2:43 PM  ? ?Provider Performing:Manuel Dall Humphrey RollsS Luanne Krzyzanowski  ? ?Procedure: Diagnostic Bronchoscopy ([id number]) ? ?Indication([name]) ?Assist with direct visualization of tracheostomy placement ? ?Consent ?Risks of the procedure as well as the alternatives and risks of each were explained to the patient and/or caregiver.  Consent for the procedure was obtained. ? ? ?Anesthesia ?See separate tracheostomy note ? ? ?Time Out ?Verified patient identification, verified procedure, site/side was marked, verified correct patient position, special equipment/implants available, medications/allergies/relevant history reviewed, required imaging and test results available. ? ? ?Sterile Technique ?Usual hand hygiene, masks, gowns, and gloves were used ? ? ?Procedure Description ?Bronchoscope advanced through endotracheal tube and into airway.  After suctioning out tracheal secretions, bronchoscope used to provide direct visualization of tracheostomy placement. ? ? ?Complications/Tolerance ?None; patient tolerated the procedure well. ? ? ?EBL ?<20 cc ? ?Specimen([name]) ?None ? ?

## 2021-09-16 NOTE — Progress Notes (Signed)
Ok to treat w/ pancytopenia per Dr. Mosetta Putt. ? ?Ebony Hail, Pharm.D., CPP ?09/16/2021@9 :35 AM ? ? ?

## 2021-09-16 NOTE — Progress Notes (Signed)
Spoke with Dr. Earlene Plater with ID to discuss possibility of PICC placement due to patient illness acuity, lab draws and chemotherapy. Patient has previously had a PICC line and developed bacteremia however Dr. Earlene Plater stated the blood cultures have been clear for some time and he's comfortable with a new line being placed. Relayed message to Cloyd Stagers, PA who will place PICC order. PICC team notified.  ? ?Alanis Clift Loyola Mast, RN ? ?

## 2021-09-16 NOTE — Progress Notes (Signed)
Peripherally Inserted Central Catheter Placement ? ?The IV Nurse has discussed with the patient and/or persons authorized to consent for the patient, the purpose of this procedure and the potential benefits and risks involved with this procedure.  The benefits include less needle sticks, lab draws from the catheter, and the patient may be discharged home with the catheter. Risks include, but not limited to, infection, bleeding, blood clot (thrombus formation), and puncture of an artery; nerve damage and irregular heartbeat and possibility to perform a PICC exchange if needed/ordered by physician.  Alternatives to this procedure were also discussed.  Bard Power PICC patient education guide, fact sheet on infection prevention and patient information card has been provided to patient /or left at bedside.   ? ?PICC Placement Documentation  ?PICC Triple Lumen 09/16/21 Right Basilic 38 cm 0 cm (Active)  ?Indication for Insertion or Continuance of Line Vasoactive infusions 09/16/21 1159  ?Exposed Catheter (cm) 0 cm 09/16/21 1159  ?Site Assessment Clean, Dry, Intact 09/16/21 1159  ?Lumen #1 Status Flushed;Blood return noted;Saline locked 09/16/21 1159  ?Lumen #2 Status Flushed;Blood return noted;Leaking;Saline locked 09/16/21 1159  ?Lumen #3 Status Blood return noted;Flushed;Saline locked 09/16/21 1159  ?Dressing Type Transparent 09/16/21 1159  ?Dressing Status Antimicrobial disc in place 09/16/21 1159  ?Dressing Change Due 09/23/21 09/16/21 1159  ? ? ? ? ? ?Carrie [name] ?09/16/2021, 12:02 PM ? ?

## 2021-09-16 NOTE — Procedures (Signed)
Percutaneous Tracheostomy Procedure Note ? ? ?Carrie [name]  ?[id number]  ?[birth year] ? ?Date:09/16/21  ?Time:2:52 PM  ? ?Provider Performing:Phuc Kluttz ? ?Procedure: Percutaneous Tracheostomy with Bronchoscopic Guidance ([id number]) ? ?Indication(s) ?Acute respiratory failure ? ?Consent ?Risks of the procedure as well as the alternatives and risks of each were explained to the patient and/or caregiver.  Consent for the procedure was obtained. ? ?Anesthesia ?Etomidate, Versed, Fentanyl, Vecuronium ? ? ?Time Out ?Verified patient identification, verified procedure, site/side was marked, verified correct patient position, special equipment/implants available, medications/allergies/relevant history reviewed, required imaging and test results available. ? ? ?Sterile Technique ?Maximal sterile technique including sterile barrier drape, hand hygiene, sterile gown, sterile gloves, mask, hair covering. ? ? ? ?Procedure Description ?Appropriate anatomy identified by palpation.  Patient's neck prepped and draped in sterile fashion.  1% lidocaine with epinephrine was used to anesthetize skin overlying neck.  1.5cm incision made and blunt dissection performed until tracheal rings could be easily palpated.   Then a size 6 Shiley tracheostomy was placed under bronchoscopic visualization using usual Seldinger technique and serial dilation.   Bronchoscope confirmed placement above the carina.  Tracheostomy was sutured in place with adhesive pad to protect skin under pressure.    Patient connected to ventilator. ? ? ?Complications/Tolerance ?None; patient tolerated the procedure well. ?Chest X-ray is ordered to confirm no post-procedural complication. ? ? ?EBL ?Minimal ? ? ?Specimen(s) ?None  ? ?

## 2021-09-16 NOTE — Progress Notes (Signed)
Nutrition Follow-up ? ?DOCUMENTATION CODES:  ? ?Not applicable ? ?INTERVENTION:  ? ?Tube feeding via Cortrak tube (gastric): ?Vital 1.5 @ 60 ml/h (1440 ml per day) ?Prosource TF 90 ml BID ? ?Provides 2320 kcal, 141 gm protein, 1094 ml free water daily ? ?200 ml free water every 2 hours ?Total free water: 3494 ml  ? ? ?1 packet Juven BID, each packet provides 95 calories, 2.5 grams of protein (collagen), and 9.8 grams of carbohydrate (3 grams sugar); also contains 7 grams of L-arginine and L-glutamine, 300 mg vitamin C, 15 mg vitamin E, 1.2 mcg vitamin B-12, 9.5 mg zinc, 200 mg calcium, and 1.5 g  Calcium Beta-hydroxy-Beta-methylbutyrate to support wound healing ? ?Continue MVI with minerals daily per tube ? ?500 mg Vitamin C BID per tube ?220 mg Zinc daily x 2 weeks per tube ? ?NUTRITION DIAGNOSIS:  ? ?Increased nutrient needs related to acute illness, wound healing as evidenced by estimated needs. ?Ongoing.  ? ?GOAL:  ? ?Patient will meet greater than or equal to 90% of their needs ?Met with TF.  ? ?MONITOR:  ? ?Vent status, Labs, Weight trends, I & O's, Skin ? ?REASON FOR ASSESSMENT:  ? ?Consult ?Enteral/tube feeding initiation and management ? ?ASSESSMENT:  ? ?61 y.o. female past medical history of HTN, HLD, depression, anxiety, dermatomyositis, rheumatoid arthritis on Plaquenil, mycophenolate, and daily prednisone, and fatty liver. She presented to the ED due to AMS, abdominal pain, and several days of constipation with significantly decreased PO intake. Her niece reported that prednisone was decreased and then subsequently needed to be increased recently. Her niece reported that patient has had similar symptoms in the past when steroids have been tapered. She was admitted for lactic acidosis. ? ?Pt discussed during ICU rounds and with RN. Hematology following for [facility] and receiving chemo.   ?Pt with multiple wounds, wound care consulted.  ? ?Weight 133.6 lb on admission, 162 lb currently. Pt with moderate to  deep pitting edema noted on exam per RN.  ? ?3/17 intubated  ?3/24 dx with [place] started on etoposide ?3/26 candida fungemia ?3/27 s/p cortrak placement; tip gastric per xray  ?4/3 restarted etoposide ?4/11 plan for trach ? ?Medications reviewed and include: vitamin C (started 4/5), folic acid, SSI, novolog, semglee, keppra, MVI with minerals, Juven, protonix, thiamine, zinc (4/5-4/19) ?Decadron ?Fentanyl  ? ?Labs reviewed: Na 147, Albumin <1.5, AST: 420, ALT: 441, Ferritin: >7500 ?CBG's: 72-143 ? ?CRP: 19.4 (3/21) ?B1: 47.4 (3/12) ?B12: 2220 (3/14) ?Ferritin: >7500 (3/25) ? ? ?Diet Order:   ?Diet Order   ? ?       ?  Diet NPO time specified  Diet effective now       ?  ? ?  ?  ? ?  ? ? ?EDUCATION NEEDS:  ? ?No education needs have been identified at this time ? ?Skin:  Skin Assessment:  (MASD: thigh) ?Skin Integrity Issues:: Stage IV ?Stage II: buttocks ?Stage III: R hand and lip ?Stage IV: sacrum ? ?Last BM:  4/10 ? ?Height:  ? ?Ht Readings from Last 1 Encounters:  ?08/26/21 _0  (1.6 m)  ? ? ?Weight:  ? ?Wt Readings from Last 1 Encounters:  ?09/16/21 73.9 kg  ? ? ?Ideal Body Weight:  47.7 kg ? ?BMI:  Body mass index is 28.86 kg/m?. ? ?Estimated Nutritional Needs:  ? ?Kcal:  2000-2300 ? ?Protein:  125-140 grams ? ?Fluid:  >/= 2 L/day ? ?Lockie Pares., RD, LDN, CNSC ?See AMiON for contact information  ? ?

## 2021-09-16 NOTE — Progress Notes (Signed)
Etoposide verified with Carley Hammed RN, chemotherapy/immunotherapy certified RN, for correct dose, patient, and expiration date prior to administration. ?

## 2021-09-16 NOTE — Progress Notes (Addendum)
reduce Etoposide dose for elevated LFT's per Dr. Mosetta PuttFeng.  Ok to treat. ? ?Ebony HailGinna Eriberto Felch, Pharm.D., CPP ?09/16/2021@10 :14 AM ? ? ?

## 2021-09-16 NOTE — Progress Notes (Addendum)
? ?NAME:  GLORA HULGAN, MRN:  [id number], DOB:  [birth year], LOS: 14 ?ADMISSION DATE:  08/15/2021, CONSULTATION DATE:  08/22/2021 ?REFERRING MD:  Darrick Meigs, CHIEF COMPLAINT:  Confusion  ? ?History of Present Illness:  ?61 year old woman with history of dermatomyositis and rheumatoid arthritis (on immunosuppressive therapy) who presented 3/12 with AMS, abdominal pain, constipation and volume depletion.  Found to have pancytopenia; seen by Hematology and initially felt to be related to mycophenolate/steroid therapy.  BMBx showed hypercellular marrow w/o blasts or malignant cells.  She developed worsening encephalopathy on 3/16 and Neuro was consulted.  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 ? ?On 3/17, patient had new onset seizure and required intubation for airway protection. PCCM consulted. She was transferred from [facility] to [facility] for LTM. Later felt to have [place] and started on dexamethasone and etoposide. Course complicated by Candidal fungemia likely from PICC line. ? ?Pertinent Medical History:  ?Rheumatoid arthritis, Dermatomyositis, Fatty liver, HLD, HTN, Depression, Anxiety ? 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 ?3/26 Candida fungemia for which started on micafungin. Dex and Etoposide stopped. [place] discussion with family - palliative care consulted  ?3/28 Febrile overnight for which cefepime added. WBC dropped to 1.4; started on Granix inj. ?3/31 restart dexamethasone ?4/01 Bronch/BAL ?4/03 restart etoposide ?4/04 change to pressure control to improve vent  synchrony ?4/05 Better tolerating pressure control. Increased peripheral edema. ?4/06 Continues to tolerate pressure control. ANC dropping, 100 today. Placed on Neutropenic Precautions. Ongoing peripheral edema. Nearing tracheostomy discussion. ?4/07 Tolerating pressure control.  Slightly more awake with weaned sedation, biting ETT with suctioning, withdrawing all 4 extremities to pain.  Per Onc, re-dose etoposide today.  Ongoing discussion re: extubation trial versus tracheostomy. ?4/10 Hgb drift with blood/clot passage from rectum. Received 2U PRBCs, 1U Plt ?4/11 Plt transfusion, plan for tracheostomy. ? ?Studies:  ?03/2021 labs >> ANA positive, Jo-1 neg, centromere neg, ds DNA neg, RNP positive, SSA/SSB neg, RF positive, chromatin Ab neg, CCP neg ?MRI 3/13 >> no acute findings ?3/17 bone marrow biopsy >> hypercellular bone marrow with trilineage hematopoiesis, comment: non-specific, follow up cytogenetic studies ?3/12 CT Abdomen and pelvis >> no acute findings, adrenal nodule, diverticulosis ?3/12 CT Head without contrast >> no acute findings ?3/16 EEG >> moderate diffuse encephalopathy of nonspecific etiology, no seizures or epileptiform discharges noted during the recording ?3/19 Echo >> EF 60 to 65%, mild LVH, grade 1 DD, mild/mod TR ?3/29 CT Abd >> b/l ASD, small GB stone, diffuse subcutaneous edema ?3/31 CT Head >> mild cortical atrophy ?4/06 CT Head >> NAICA ?4/09 MRI >> No acute or focal finding.  Generalized brain atrophy. Bilateral mastoid effusions. ? ?Micro:  ?3/16 ParvoB19 IgG pos, IgM neg ?3/16 EBV IgG pos, IgM neg ?3/16 CMV IgG pos, IgM neg ?3/16 HHV6 IgG pos ?3/17 Bone marrow biopsy > hypercellular with trilineage hematopoiesis ?3/23 Blood culture 1/4 > Candida glabrata ?4/01 BAL > (WBC/PMN and mononuclear), gram variable rods, gram positive cocci, > few normal resp flora, no staph/PsA ?4/01 PCP > ?4/01 BAL AFB > Negative ?4/01 BAL Fungus > NGTD ? ?Antibiotics:  ?Rocephin 3/12 ?Acyclovir 3/16>3/19   ?Unasyn 3/19 >  3/24 ?Micafungin 3/24 >  ?Cefepime 3/28 > 4/4, resumed 4/7.  ? ?Interim History / Subjective:  ?Blood/passage of large clots per rectum yesterday ?Last large clot passed 4/11AM 0200 ?Receiving Plt, s/p PRBCs/Plt ?Remains pancytopenic ?Plan for tracheostomy today, will order Plt prior to/during procedure ?New chemo plan with ITC per Onc ?PICC placement today for access ? ?Objective:  ?Blood pressure 120/80, pulse (!) 102, temperature 98.8 ?F (37.1 ?C), temperature source Axillary, resp. rate 18, height _0  (1.6 m), weight 73.9 kg, SpO2 100 %. ?   ?Vent Mode: PCV ?FiO2 (%):  [30 %-40 %] 30 % ?Set Rate:  [18 bmp] 18 bmp ?PEEP:  [5 cmH20] 5 cmH20 ?Pressure Support:  [15 cmH20] 15 cmH20 ?Plateau Pressure:  [14 cmH20-18 cmH20] 17 cmH20  ? ?Intake/Output Summary (Last 24 hours) at 09/16/2021 0726 ?Last data filed at 09/16/2021 0700 ?Gross per 24 hour  ?Intake 4507.5 ml  ?Output 2250 ml  ?Net 2257.5 ml  ? ? ?Filed Weights  ? 09/12/21 0500 09/14/21 0500 09/16/21 0500  ?Weight: 71.9 kg 71.6 kg 73.9 kg  ? ?Physical Examination: ?General: Chronically ill-appearing middle-aged woman in NAD. ?HEENT: [place]/AT, anicteric sclera, PERRL (sluggish), moist mucous membranes. ETT in place. ?Neuro: Sedated. Occasionally opens eyes to stimuli, not tracking. Withdraws to pain in all 4 extremities, R > L. Not following commands. Moves all 4 extremities spontaneously.  +Corneal, +Cough, and +Gag  ?CV: RRR, no m/g/r. ?PULM: Breathing even and unlabored on vent (PC, 18/5, FiO2 40%). Lung fields coarse in upper fields bilaterally. ?GI: Soft, nontender, nondistended. Normoactive bowel sounds. ?Extremities: Bilateral symmetric 1+ LE edema noted, nonpitting. Bilateral symmetric 1-2+ UE edema, nonpitting. ?Skin: Warm/dry, scattered ecchymosis; anterior neck with ecchymosis. ? ?Resolved Hospital Problem List:   ?Aspiration pneumonia, Septic shock, Shock liver, AKI from ATN in setting of sepsis ? ?Assessment & Plan:  ? ?Rheumatoid  arthritis, Dermatomyositis ?[place] vs. MAS ?Primary versus MAS in the setting of connective tissue disease. Lab markers increased (IL-2 receptor). Repeat MRI Brain 4/9 NAICA. ?- Hematology/Oncology following, appreciate assistance ?- Treatment plans now include etoposide, intrathecal chemo (ITC), cyclosporine ?- Next etoposide today, 4/11; then 4/14 and Q Friday x 5 weeks ?- ITC to begin 4/13 (methotrexate, steroid) x 4 weeks ?- Oral cyclosporine started 4/11 ?- Dexamethasone taper, continue to administer prior to chemotherapy ?- Bactrim/voriconazole ppx in the setting of chemo/immunosuppression ? ?Febrile Neutropenia ?Candidal fungemia in setting of chronic immunosuppression ?HCAP ?- ID consulted, appreciate recommendations ?- Continue cefepime (resumed 4/7) ?- S/p micafungin x 14 days (ended 4/9) ?- Continue immunosuppression ppx as above ?- F/u remaining finalizaed Cx data (4/1) ? ?Acute metabolic encephalopathy, uncertain etiology ?New onset seizure. ?Hx of anxiety and depression ?Concern for AI process versus [place] with CNS involvement. Brain imaging/EEG unremarkable. ?- RASS goal 0 to -1 ?- Continue Abilify, Lexapro, Klonopin ?- Continue Keppra ? ?Pancytopenia ?Multifactorial in the setting of chemotherapy and [place]. ?- Trend CBC with differential ?- Monitor ANC ?- Neutropenic precautions ?- Granix per Onc, next 4/12 ?- Transfuse for Hgb < 7.0, Plt < 20 or hemodynamically significant bleeding (has received several units PRBCs/Plt) ?- Prepare Plt for transfusion prior to/during tracheostomy procedure 4/11 ? ?Acute hypoxic respiratory failure from HCAP ?- Continue PC vs. PSV, tolerating better than PRVC (less vent dyssynchrony) ?- Wean FiO2 for O2 sat > 90% ?- Daily WUA/SBT; not alert enough for safe extubation despite tolerating extended periods of vent weaning/PSV ?- Plan for tracheostomy today, 4/11 ?- VAP bundle ?- Pulmonary hygiene ?- PAD protocol  for sedation: Fentanyl for goal RASS 0 to -1 ? ?Steroid-induced  hyperglycemia ?- Semglee ?- TF coverage, SSI resistant scale ?- CBGs Q4H ? ?Hx of HTN ?Hypervolemia ?Hypernatremia ?- Continue Lopressor ?- Continue FWF, may need to scale back given improvement in Na/volume status

## 2021-09-17 DIAGNOSIS — D761 Hemophagocytic lymphohistiocytosis: Secondary | ICD-10-CM | POA: Diagnosis not present

## 2021-09-17 DIAGNOSIS — G9340 Encephalopathy, unspecified: Secondary | ICD-10-CM | POA: Diagnosis not present

## 2021-09-17 DIAGNOSIS — B377 Candidal sepsis: Secondary | ICD-10-CM | POA: Diagnosis not present

## 2021-09-17 LAB — CBC WITH DIFFERENTIAL/PLATELET
HCT: 20.8 % — ABNORMAL LOW (ref 36.0–46.0)
HCT: 25.6 % — ABNORMAL LOW (ref 36.0–46.0)
Hemoglobin: 7 g/dL — ABNORMAL LOW (ref 12.0–15.0)
Hemoglobin: 8.6 g/dL — ABNORMAL LOW (ref 12.0–15.0)
MCH: 30.3 pg (ref 26.0–34.0)
MCH: 30.5 pg (ref 26.0–34.0)
MCHC: 33.7 g/dL (ref 30.0–36.0)
MCHC: 33.6 g/dL (ref 30.0–36.0)
MCV: 90 fL (ref 80.0–100.0)
MCV: 90.8 fL (ref 80.0–100.0)
Platelets: 21 10*3/uL — CL (ref 150–400)
Platelets: 10 10*3/uL — CL (ref 150–400)
RBC: 2.31 MIL/uL — ABNORMAL LOW (ref 3.87–5.11)
RBC: 2.82 MIL/uL — ABNORMAL LOW (ref 3.87–5.11)
RDW: 17.2 % — ABNORMAL HIGH (ref 11.5–15.5)
RDW: 17.2 % — ABNORMAL HIGH (ref 11.5–15.5)
WBC: <0.1 10*3/uL — CL (ref 4.0–10.5)
WBC: <0.1 10*3/uL — CL (ref 4.0–10.5)
nRBC: 0 % (ref 0.0–0.2)

## 2021-09-17 LAB — GLUCOSE, CAPILLARY
Glucose-Capillary: 234 mg/dL — ABNORMAL HIGH (ref 70–99)
Glucose-Capillary: 165 mg/dL — ABNORMAL HIGH (ref 70–99)
Glucose-Capillary: 161 mg/dL — ABNORMAL HIGH (ref 70–99)
Glucose-Capillary: 133 mg/dL — ABNORMAL HIGH (ref 70–99)
Glucose-Capillary: 109 mg/dL — ABNORMAL HIGH (ref 70–99)
Glucose-Capillary: 94 mg/dL (ref 70–99)

## 2021-09-17 LAB — PNEUMOCYSTIS JIROVECI SMEAR BY DFA

## 2021-09-17 LAB — COMPREHENSIVE METABOLIC PANEL
ALT: 283 U/L — ABNORMAL HIGH (ref 0–44)
AST: 100 U/L — ABNORMAL HIGH (ref 15–41)
Albumin: <1.5 g/dL — ABNORMAL LOW (ref 3.5–5.0)
Alkaline Phosphatase: 384 U/L — ABNORMAL HIGH (ref 38–126)
Anion gap: 6 (ref 5–15)
BUN: 115 mg/dL — ABNORMAL HIGH (ref 8–23)
CO2: 22 mmol/L (ref 22–32)
Calcium: 8.4 mg/dL — ABNORMAL LOW (ref 8.9–10.3)
Chloride: 116 mmol/L — ABNORMAL HIGH (ref 98–111)
Creatinine, Ser: 0.95 mg/dL (ref 0.44–1.00)
GFR, Estimated: >60 mL/min (ref >60)
Glucose, Bld: 250 mg/dL — ABNORMAL HIGH (ref 70–99)
Potassium: 5.1 mmol/L (ref 3.5–5.1)
Sodium: 144 mmol/L (ref 135–145)
Total Bilirubin: 1.1 mg/dL (ref 0.3–1.2)
Total Protein: 4.6 g/dL — ABNORMAL LOW (ref 6.5–8.1)

## 2021-09-17 LAB — PREPARE RBC (CROSSMATCH)

## 2021-09-17 MED ORDER — OXYCODONE HCL 5 MG/5ML PO SOLN
5.0000 mg | Freq: Four times a day (QID) | ORAL | Status: DC
  Filled 2021-09-17: qty 5

## 2021-09-17 MED ORDER — SODIUM CHLORIDE 0.9 % IV BOLUS: 1000.0000 mL | Freq: Once | INTRAVENOUS | Status: DC

## 2021-09-17 MED ORDER — SODIUM CHLORIDE 0.9% IV SOLUTION: Freq: Once | INTRAVENOUS | Status: DC

## 2021-09-17 MED ORDER — OXYCODONE HCL 5 MG/5ML PO SOLN
5.0000 mg | Freq: Four times a day (QID) | ORAL | Status: DC
  Administered 2021-09-17 – 2021-09-18 (×3): 5 mg
  Filled 2021-09-17 (×4): qty 5

## 2021-09-17 MED ORDER — SODIUM CHLORIDE 0.9% IV SOLUTION: Freq: Once | INTRAVENOUS | Status: AC

## 2021-09-17 NOTE — Progress Notes (Signed)
eLink Physician-Brief Progress Note ?Patient Name: Carrie CoxLivia S  ?DOB: 12/05/1960 ?MRN: [id number] ? ? ?Date of Service ? 09/17/2021  ?HPI/Events of Note ? Rectal bleeding with clots. Hgb = 8.6. Platelets = 10.  ?eICU Interventions ? Plan: ?Transfuse 2 units single donor platelets now.   ? ? ? ?Intervention Category ?Major Interventions: Hemorrhage - evaluation and management ? ?Kynan Peasley Dennard NipEugene ?09/17/2021, 11:36 PM ?

## 2021-09-17 NOTE — Progress Notes (Signed)
HEMATOLOGY-ONCOLOGY PROGRESS NOTE ? ?ASSESSMENT AND PLAN: ?61 yo female  ?  ?Acute encephalopathy, likely [place] with CNS involvement  ?[place]  ?Pancytopenia secondary to [facility] and chemo  ?Hypertension ?Rheumatoid arthritis/dermatomyositis ?Depression and anxiety ?Stage III sacral ulcer ?Folate deficiency  ?Transaminitis ?Acute hypoxic respiratory failure ?Aspiration pneumonia ?Seizure, resolved ?  ?Recommendations: ?-lab reviewed, pt continues to have severe pancytopenia, received 1 unit of blood this morning.  Platelet down to 21 K, no active bleeding.  ?-I spoke with the pharmacy yesterday, due to the drug drug interaction between cyclosporine and voriconazole, cyclosporine dose was reduced to 50%, I will order trough level tomorrow, blood sugar to be drawn before morning dose cyclosporine, I spoke with her bedside nurse today ?-plan to give her first dose IT methotrexate and hydrocortisone tomorrow by IR at 1pm, I will order 1u plt tomorrow morning and repeat CBC after plt transfusion  ?-next dose etoposide this Friday 4/14, then weekly X5 more dose  ?-will start tapering dexa dose this Friday,per HLH treatment protocol  ?-continue prophylactic antibiotics, she is currently on IV cefepime due to her recent neutropenic fever. ? ? ?SUBJECTIVE: Remains on the ventilator today.  Sedated.  Blood pressure slightly when I saw her, (1 reading only).  She is unresponsive.  Per her nurse, no active bleeding noticed today.  She received 1 unit PRBC this morning.  She tolerated tracheostomy yesterday. ? ?REVIEW OF SYSTEMS:   ?Review of Systems  ?Reason unable to perform ROS: Sedated, on ventilator.  ? ?PHYSICAL EXAMINATION: ?ECOG PERFORMANCE STATUS: 4 - Bedbound ? ?Vitals:  ? 09/17/21 1600 09/17/21 1700  ?BP: 101/75 (!) 86/61  ?Pulse: 95   ?Resp: (!) 22 17  ?Temp: 98 ?F (36.7 ?C)   ?SpO2:    ? ?Filed Weights  ? 09/14/21 0500 09/16/21 0500 09/17/21 0456  ?Weight: 157 lb 13.6 oz (71.6 kg) 162 lb 14.7 oz (73.9 kg) 165 lb 2 oz (74.9  kg)  ? ? ?Intake/Output from previous day: ?04/11 0701 - 04/12 0700 ?In: 5301.7 [I.V.:516.5; NG/GT:3850; IV Piggyback:935.2] ?Out: 2000 [Urine:2000] ? ?Physical Exam ?Vitals reviewed.  ?Constitutional:   ?   Comments: On ventilator.  Eyes open but does not respond to me.  Generalized edema noted.  ?HENT:  ?   Head: Normocephalic.  ?Musculoskeletal:  ?   Right lower leg: Edema present.  ?   Left lower leg: Edema present.  ?Neurological:  ?   Comments: Sedated.  ? ? ?LABORATORY DATA:  ?I have reviewed the data as listed ? ?  Latest Ref Rng & Units 09/17/2021  ?  4:51 AM 09/16/2021  ?  3:13 AM 09/15/2021  ?  2:46 AM  ?CMP  ?Glucose 70 - 99 mg/dL 250   207   275    ?BUN 8 - 23 mg/dL 115   112   105    ?Creatinine 0.44 - 1.00 mg/dL 0.95   0.93   0.92    ?Sodium 135 - 145 mmol/L 144   147   148    ?Potassium 3.5 - 5.1 mmol/L 5.1   4.6   4.9    ?Chloride 98 - 111 mmol/L 116   118   117    ?CO2 22 - 32 mmol/L 22   22   24     ?Calcium 8.9 - 10.3 mg/dL 8.4   8.5   9.0    ?Total Protein 6.5 - 8.1 g/dL 4.6   4.5   5.6    ?Total Bilirubin 0.3 - 1.2  mg/dL 1.1   1.7   0.9    ?Alkaline Phos 38 - 126 U/L 384   488   209    ?AST 15 - 41 U/L 100   430   64    ?ALT 0 - 44 U/L 283   441   94    ? ? ?Lab Results  ?Component Value Date  ? WBC <0.1 (LL) 09/17/2021  ? HGB 7.0 (L) 09/17/2021  ? HCT 20.8 (L) 09/17/2021  ? MCV 90.0 09/17/2021  ? PLT 21 (LL) 09/17/2021  ? NEUTROABS TOO FEW TO COUNT, SMEAR AVAILABLE FOR REVIEW 09/17/2021  ? ? ?Lab Results  ?Component Value Date  ? CEA1 36.4 (H) 08/28/2021  ? ? ?CT HEAD WO CONTRAST (5MM) ? ?Result Date: 09/11/2021 ?CLINICAL DATA:  Altered mental status. EXAM: CT HEAD WITHOUT CONTRAST TECHNIQUE: Contiguous axial images were obtained from the base of the skull through the vertex without intravenous contrast. RADIATION DOSE REDUCTION: This exam was performed according to the departmental dose-optimization program which includes automated exposure control, adjustment of the mA and/or kV according to  patient size and/or use of iterative reconstruction technique. COMPARISON:  September 05, 2021. FINDINGS: Brain: Mild chronic ischemic white matter disease is noted. No mass effect or midline shift is noted. Ventricular size is within normal limits. There is no evidence of mass lesion, hemorrhage or acute infarction. Vascular: No hyperdense vessel or unexpected calcification. Skull: Normal. Negative for fracture or focal lesion. Sinuses/Orbits: Mild bilateral maxillary mucosal thickening is noted. Other: None. IMPRESSION: No acute intracranial abnormality seen. Electronically Signed   By: Marijo Conception M.D.   On: 09/11/2021 11:31  ? ?CT HEAD WO CONTRAST (5MM) ? ?Result Date: 09/05/2021 ?CLINICAL DATA:  Altered mental status. EXAM: CT HEAD WITHOUT CONTRAST TECHNIQUE: Contiguous axial images were obtained from the base of the skull through the vertex without intravenous contrast. RADIATION DOSE REDUCTION: This exam was performed according to the departmental dose-optimization program which includes automated exposure control, adjustment of the mA and/or kV according to patient size and/or use of iterative reconstruction technique. COMPARISON:  August 17, 2021. FINDINGS: Brain: Mild diffuse cortical atrophy is noted. No mass effect or midline shift is noted. Ventricular size is within normal limits. There is no evidence of mass lesion, hemorrhage or acute infarction. Vascular: No hyperdense vessel or unexpected calcification. Skull: Normal. Negative for fracture or focal lesion. Sinuses/Orbits: Left frontal, ethmoid and maxillary sinusitis is noted. Other: Fluid is noted in the mastoid air cells bilaterally. IMPRESSION: No acute intracranial abnormality seen. Electronically Signed   By: Marijo Conception M.D.   On: 09/05/2021 14:37  ? ?CT ABDOMEN W CONTRAST ? ?Result Date: 09/03/2021 ?CLINICAL DATA:  Candidiasis.  Evaluate for intra-abdominal abscess. EXAM: CT ABDOMEN WITH CONTRAST TECHNIQUE: Multidetector CT imaging of the  abdomen was performed using the standard protocol following bolus administration of intravenous contrast. RADIATION DOSE REDUCTION: This exam was performed according to the departmental dose-optimization program which includes automated exposure control, adjustment of the mA and/or kV according to patient size and/or use of iterative reconstruction technique. CONTRAST:  160mL OMNIPAQUE IOHEXOL 300 MG/ML  SOLN COMPARISON:  08/28/2021 FINDINGS: Lower chest: Significant interval progression of bilateral interstitial and airspace opacities within both lungs. Hepatobiliary: No focal liver abnormality. Small stone within the gallbladder measures 3 mm. No signs of gallbladder wall thickening or bile duct dilatation. Pancreas: Unremarkable. No pancreatic ductal dilatation or surrounding inflammatory changes. Spleen: Normal in size without focal abnormality. Adrenals/Urinary Tract: Normal right adrenal  gland. 2 cm left adrenal nodule is identified measuring 22.6 Hounsfield units, image 37/3. Unchanged from previous exam. No nephrolithiasis, hydronephrosis, or mass. Stomach/Bowel: Enteric tube tip is in the body of the stomach. Enteric contrast material is noted within the gastric lumen. The visualized bowel loops are nondilated. No wall thickening or bowel inflammation identified. Vascular/Lymphatic: No significant vascular findings are present. No enlarged abdominal or pelvic lymph nodes. Other: No free fluid or fluid collections. No signs of intra-abdominal abscess. Diffuse subcutaneous edema is identified compatible with anasarca. Musculoskeletal: No acute osseous findings. IMPRESSION: 1. No signs of intra-abdominal abscess. 2. Significant interval progression of bilateral interstitial and airspace opacities within both lungs. Imaging findings are concerning for multifocal inflammation/infection. Atypical infection not excluded. 3. Diffuse subcutaneous edema compatible with anasarca. 4. Gallstone. 5. 2 cm left adrenal  nodule is identified measuring 22.6 Hounsfield units. Unchanged from previous exam. Follow-up nonemergent adrenal protocol CT scan is recommended in the near future to provide definitive characterization. This r

## 2021-09-17 NOTE — Progress Notes (Signed)
eLink Physician-Brief Progress Note ?Patient Name: Pershing CoxLivia S  ?DOB: 11/18/1960 ?MRN: [id number] ? ? ?Date of Service ? 09/17/2021  ?HPI/Events of Note ? Hypotension - BP = 85/61 with MAP = 70. LVEF = 60-65%. ?  ?eICU Interventions ? Plan: ?Bolus with 0.9 NaCl 1 liter IV over 1 hour now.   ? ? ? ?Intervention Category ?Major Interventions: Hypotension - evaluation and management ? ?Shawonda Kerce Dennard NipEugene ?09/17/2021, 8:40 PM ?

## 2021-09-17 NOTE — Progress Notes (Signed)
PT Cancellation Note ? ?Patient Details ?Name: Carrie CoxLivia [name]  ?MRN: [id number] ?DOB: 08/13/1960 ? ? ?Cancelled Treatment:    Reason Eval/Treat Not Completed: Medical issues which prohibited therapy. Pt remains obtunded and unable to follow commands. Acute PT will sign off at this time. Please re-consult when pt is better able to participate in PT evaluation. ? ? ?Arlyss Gandyyan J Ruffin Lada ?09/17/2021, 9:39 AM ?

## 2021-09-17 NOTE — Progress Notes (Signed)
? ?NAME:  Carrie [name], MRN:  [id number], DOB:  [birth year], LOS: 69 ?ADMISSION DATE:  08/27/2021, CONSULTATION DATE:  08/22/2021 ?REFERRING MD:  Darrick Meigs, CHIEF COMPLAINT:  Confusion  ? ?History of Present Illness:  ?61 year old woman with history of dermatomyositis and rheumatoid arthritis (on immunosuppressive therapy) who presented 3/12 with AMS, abdominal pain, constipation and volume depletion.  Found to have pancytopenia; seen by Hematology and initially felt to be related to mycophenolate/steroid therapy.  BMBx showed hypercellular marrow w/o blasts or malignant cells.  She developed worsening encephalopathy on 3/16 and Neuro was consulted.  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 ? ?On 3/17, patient had new onset seizure and required intubation for airway protection. PCCM consulted. She was transferred from [facility] to [facility] for LTM. Later felt to have [place] and started on dexamethasone and etoposide. Course complicated by Candidal fungemia likely from PICC line. ? ?Pertinent Medical History:  ?Rheumatoid arthritis, Dermatomyositis, Fatty liver, HLD, HTN, Depression, Anxiety ? 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 ?3/26 Candida fungemia for which started on micafungin. Dex and Etoposide stopped. [place] discussion with family - palliative care consulted  ?3/28 Febrile overnight for which cefepime added. WBC dropped to 1.4; started on Granix inj. ?3/31 restart dexamethasone ?4/01 Bronch/BAL ?4/03 restart etoposide ?4/04 change to pressure control to improve vent  synchrony ?4/05 Better tolerating pressure control. Increased peripheral edema. ?4/06 Continues to tolerate pressure control. ANC dropping, 100 today. Placed on Neutropenic Precautions. Ongoing peripheral edema. Nearing tracheostomy discussion. ?4/07 Tolerating pressure control.  Slightly more awake with weaned sedation, biting ETT with suctioning, withdrawing all 4 extremities to pain.  Per Onc, re-dose etoposide today.  Ongoing discussion re: extubation trial versus tracheostomy. ?4/10 Hgb drift with blood/clot passage from rectum. Received 2U PRBCs, 1U Plt ?4/11 Plt transfusion,tracheostomy, PICC line, etoposide ? ?Studies:  ?03/2021 labs >> ANA positive, Jo-1 neg, centromere neg, ds DNA neg, RNP positive, SSA/SSB neg, RF positive, chromatin Ab neg, CCP neg ?MRI 3/13 >> no acute findings ?3/17 bone marrow biopsy >> hypercellular bone marrow with trilineage hematopoiesis, comment: non-specific, follow up cytogenetic studies ?3/12 CT Abdomen and pelvis >> no acute findings, adrenal nodule, diverticulosis ?3/12 CT Head without contrast >> no acute findings ?3/16 EEG >> moderate diffuse encephalopathy of nonspecific etiology, no seizures or epileptiform discharges noted during the recording ?3/19 Echo >> EF 60 to 65%, mild LVH, grade 1 DD, mild/mod TR ?3/29 CT Abd >> b/l ASD, small GB stone, diffuse subcutaneous edema ?3/31 CT Head >> mild cortical atrophy ?4/06 CT Head >> NAICA ?4/09 MRI >> No acute or focal finding.  Generalized brain atrophy. Bilateral mastoid effusions. ? ?Micro:  ?3/16 ParvoB19 IgG pos, IgM neg ?3/16 EBV IgG pos, IgM neg ?3/16 CMV IgG pos, IgM neg ?3/16 HHV6 IgG pos ?3/17 Bone marrow biopsy > hypercellular with trilineage hematopoiesis ?3/23 Blood culture 1/4 > Candida glabrata ?4/01 BAL > (WBC/PMN and mononuclear), gram variable rods, gram positive cocci, > few normal resp flora, no staph/PsA ?4/01 PCP > ?4/01 BAL AFB > Negative ?4/01 BAL Fungus > NGTD ? ?Antibiotics:  ?Rocephin 3/12 ?Acyclovir  3/16>3/19  ?Unasyn 3/19 >  3/24 ?Micafungin 3/24 >  ?Cefepime 3/28 > 4/4, resumed 4/7.  ? ? ?Interim History / Subjective:  ?Trach placed yesterday. On 200 fentanyl. Becomes very agitated and uncomfortable when fentanyl is turned down.  ? ?Objective:  ?Blood pressure 120/81, pulse (!) 103, temperature 98 ?F (36.7 ?C), temperature source Axillary, resp. rate (!) 22, height _0  (1.6 m), weight 74.9 kg, SpO2 100 %. ?   ?Vent Mode: PSV;CPAP ?FiO2 (%):  [30 %-40 %] 40 % ?Set Rate:  [18 bmp] 18 bmp ?Vt Set:  [340 mL] 340 mL ?PEEP:  [5 cmH20] 5 cmH20 ?Pressure Support:  [10 cmH20] 10 cmH20 ?Plateau Pressure:  [10 cmH20-20 cmH20] 13 cmH20  ? ?Intake/Output Summary (Last 24 hours) at 09/17/2021 0935 ?Last data filed at 09/17/2021 0630 ?Gross per 24 hour  ?Intake 4231.28 ml  ?Output 2350 ml  ?Net 1881.28 ml  ? ?Filed Weights  ? 09/14/21 0500 09/16/21 0500 09/17/21 0456  ?Weight: 71.6 kg 73.9 kg 74.9 kg  ? ?Physical Examination: ?Gen:      Intubated, sedated, acutely ill appearing ?HEENT:  Trach to vent with mild bloody secretions ?Lungs:    sounds of mechanical ventilation auscultated no wheezes or crackles ?CV:         tachycardic, regular ?Abd:      + bowel sounds; soft, non-tender; no palpable masses, no distension ?Ext:    No edema ?Skin:      Warm and dry; diffuse ecchymoses bilaterally ?Neuro:   sedated, RASS -3 ? ? ?Resolved Hospital Problem List:   ?Aspiration pneumonia, Septic shock, Shock liver, AKI from ATN in setting of sepsis ?Candidemia s/p 14 days micafunging ended 4/9 ?HCAP ?Assessment & Plan:  ? ?Rheumatoid arthritis, Dermatomyositis ?[place] vs. 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 Lab markers increased (IL-2 receptor). Repeat MRI Brain 4/9 NAICA. ?- Hematology/Oncology following, appreciate assistance ?- Treatment plans now include etoposide, intrathecal chemo (ITC), cyclosporine ?- Next etoposide 4/14 and Q Friday x 5 weeks ?- ITC to begin 4/13 (methotrexate, steroid) x 4  weeks ?- Oral cyclosporine started 4/11 ?- Dexamethasone taper, continue to administer prior to chemotherapy ?- Bactrim/voriconazole ppx in the setting of chemo/immunosuppression ? ?Febrile Neutropenia ?- ID consulted, appreciate recommendations ?- Continue cefepime (resumed 4/7 - will complete 8 day course) ?- Continue immunosuppression ppx as above ? ?Acute metabolic encephalopathy, uncertain etiology ?New onset seizure. ?Hx of anxiety and depression ?Concern for AI process versus [place] with CNS involvement. Brain imaging/EEG unremarkable. ?- RASS goal 0 to -1 ?- Continue Abilify, Lexapro, Klonopin ?- Continue Keppra ?- will started scheduled oxycodone to help with pain control ? ?Pancytopenia ?Multifactorial in the setting of chemotherapy and [place]. ?- Trend CBC with differential ?- Monitor ANC ?- Neutropenic precautions ?- Granix per Onc, next 4/12 ?- Transfuse for Hgb < 7.0, Plt < 20 or hemodynamically significant bleeding (has received several units PRBCs/Plt) ? ?Acute hypoxic respiratory failure from HCAP ?- Continue PC vs. PSV, tolerating better than PRVC (less vent dyssynchrony) ?- Wean FiO2 for O2 sat > 90% ?- Daily WUA/SBT; not alert enough for safe extubation despite tolerating extended periods of vent weaning/PSV ?- s/p tracheostomy 4/11 ?- VAP bundle ?- Pulmonary hygiene ?- PAD protocol for sedation: Fentanyl for goal RASS 0 to -1 ? ?Steroid-induced hyperglycemia ?- Semglee ?- TF coverage, SSI resistant scale ?- CBGs Q4H ? ?Hx of HTN ?Hypervolemia ?Hypernatremia ?- Continue Lopressor ?- Continue FWF, may need to scale back given improvement in Na/volume status ?- Monitor I&Os ? ?  Pressure injury ?Stage 3 R hand - POA, stage 3 sacrum - POA, stage 2 Lt buttock, not present on admission. ?- WOC, appreciate assistance ? ?Goals of care ?- PMT consulted, following; appreciate assistance ?- Last GOC discussion 4/10, family wishes to continue aggressive care for now ?- On board with tracheostomy 4/11 ? ?Best  Practice: (right click and "Reselect all SmartList Selections" daily)  ? ?Diet/type: tubefeeds ?DVT prophylaxis: SCDs ?GI prophylaxis: PPI ?Lines: see above ?Foley:  N/A and removal ordered  ?Code Status:  full co

## 2021-09-17 NOTE — Progress Notes (Signed)
SLP Cancellation Note ? ?Patient Details ?Name: Pershing CoxLivia S Sackrider ?MRN: [id number] ?DOB: 01/05/1961 ? ? ?Cancelled treatment:       Reason Eval/Treat Not Completed: Patient not medically ready Patient with new tracheostomy 4/11 and is currently on the vent. Orders for SLP eval and treat for PMSV and swallowing received. Will follow pt closely for readiness for SLP interventions as appropriate.  ? ?Robertlee Rogacki I. Vear ClockPhillips, MS, CCC-SLP ?Acute Rehabilitation Services ?Office number [phone] ?Pager [phone] ? ? ?Scheryl MartenShanika I Drew Herman ?09/17/2021, 8:13 AM ?

## 2021-09-17 NOTE — Progress Notes (Signed)
eLink Physician-Brief Progress Note ?Patient Name: Carrie [name] ?DOB: 1960/11/22 ?MRN: [id number] ? ? ?Date of Service ? 09/17/2021  ?HPI/Events of Note ? Pancytopenia - Hgb = 7.0, Platelets = 21 and WBC < 0.1. No active bleeding. Hematology following.  ?eICU Interventions ? Transfuse 1 unit PRBC.  ? ? ? ?Intervention Category ?Major Interventions: Other: ? ?[place] Nunley Cornelia Copa ?09/17/2021, 6:47 AM ?

## 2021-09-18 ENCOUNTER — Inpatient Hospital Stay (HOSPITAL_COMMUNITY): Payer: Federal, State, Local not specified - PPO

## 2021-09-18 DIAGNOSIS — G9340 Encephalopathy, unspecified: Secondary | ICD-10-CM | POA: Diagnosis not present

## 2021-09-18 DIAGNOSIS — D761 Hemophagocytic lymphohistiocytosis: Secondary | ICD-10-CM | POA: Diagnosis not present

## 2021-09-18 DIAGNOSIS — B377 Candidal sepsis: Secondary | ICD-10-CM | POA: Diagnosis not present

## 2021-09-18 LAB — GLUCOSE, CAPILLARY
Glucose-Capillary: 93 mg/dL (ref 70–99)
Glucose-Capillary: 220 mg/dL — ABNORMAL HIGH (ref 70–99)
Glucose-Capillary: 303 mg/dL — ABNORMAL HIGH (ref 70–99)
Glucose-Capillary: 192 mg/dL — ABNORMAL HIGH (ref 70–99)
Glucose-Capillary: 176 mg/dL — ABNORMAL HIGH (ref 70–99)
Glucose-Capillary: 131 mg/dL — ABNORMAL HIGH (ref 70–99)

## 2021-09-18 LAB — CBC WITH DIFFERENTIAL/PLATELET
HCT: 23.6 % — ABNORMAL LOW (ref 36.0–46.0)
Hemoglobin: 7.9 g/dL — ABNORMAL LOW (ref 12.0–15.0)
MCH: 30.5 pg (ref 26.0–34.0)
MCHC: 33.5 g/dL (ref 30.0–36.0)
MCV: 91.1 fL (ref 80.0–100.0)
Platelets: 93 10*3/uL — ABNORMAL LOW (ref 150–400)
RBC: 2.59 MIL/uL — ABNORMAL LOW (ref 3.87–5.11)
RDW: 17.2 % — ABNORMAL HIGH (ref 11.5–15.5)
WBC: 0.1 10*3/uL — CL (ref 4.0–10.5)
nRBC: 0 % (ref 0.0–0.2)

## 2021-09-18 LAB — TYPE AND SCREEN
ABO/RH(D): A NEG
Antibody Screen: NEGATIVE
Unit division: 0
Unit division: 0
Unit division: 0
Unit division: 0

## 2021-09-18 LAB — BPAM RBC
Blood Product Expiration Date: 202304202359
Blood Product Expiration Date: 202304292359
Blood Product Expiration Date: 202304292359
Blood Product Expiration Date: 202304292359
ISSUE DATE / TIME: 202304090934
ISSUE DATE / TIME: 202304102200
ISSUE DATE / TIME: 202304102200
ISSUE DATE / TIME: 202304120727
Unit Type and Rh: 600
Unit Type and Rh: 600
Unit Type and Rh: 600
Unit Type and Rh: 600

## 2021-09-18 LAB — CULTURE, BLOOD (ROUTINE X 2): Special Requests: ADEQUATE

## 2021-09-18 LAB — COMPREHENSIVE METABOLIC PANEL
ALT: 269 U/L — ABNORMAL HIGH (ref 0–44)
AST: 145 U/L — ABNORMAL HIGH (ref 15–41)
Albumin: <1.5 g/dL — ABNORMAL LOW (ref 3.5–5.0)
Alkaline Phosphatase: 483 U/L — ABNORMAL HIGH (ref 38–126)
Anion gap: 6 (ref 5–15)
BUN: 122 mg/dL — ABNORMAL HIGH (ref 8–23)
CO2: 22 mmol/L (ref 22–32)
Calcium: 8.4 mg/dL — ABNORMAL LOW (ref 8.9–10.3)
Chloride: 112 mmol/L — ABNORMAL HIGH (ref 98–111)
Creatinine, Ser: 0.95 mg/dL (ref 0.44–1.00)
GFR, Estimated: >60 mL/min (ref >60)
Glucose, Bld: 165 mg/dL — ABNORMAL HIGH (ref 70–99)
Potassium: 5.6 mmol/L — ABNORMAL HIGH (ref 3.5–5.1)
Sodium: 140 mmol/L (ref 135–145)
Total Bilirubin: 1.2 mg/dL (ref 0.3–1.2)
Total Protein: 4.8 g/dL — ABNORMAL LOW (ref 6.5–8.1)

## 2021-09-18 LAB — BASIC METABOLIC PANEL
Anion gap: 1 — ABNORMAL LOW (ref 5–15)
BUN: 117 mg/dL — ABNORMAL HIGH (ref 8–23)
CO2: 18 mmol/L — ABNORMAL LOW (ref 22–32)
Calcium: 7.5 mg/dL — ABNORMAL LOW (ref 8.9–10.3)
Chloride: 122 mmol/L — ABNORMAL HIGH (ref 98–111)
Creatinine, Ser: 1.03 mg/dL — ABNORMAL HIGH (ref 0.44–1.00)
GFR, Estimated: >60 mL/min (ref >60)
Glucose, Bld: 163 mg/dL — ABNORMAL HIGH (ref 70–99)
Potassium: 4.6 mmol/L (ref 3.5–5.1)
Sodium: 141 mmol/L (ref 135–145)

## 2021-09-18 LAB — CSF CELL COUNT WITH DIFFERENTIAL
RBC Count, CSF: 2800 /mm3 — ABNORMAL HIGH
Tube #: 1
WBC, CSF: 2 /mm3 (ref 0–5)

## 2021-09-18 LAB — MAGNESIUM: Magnesium: 1.7 mg/dL (ref 1.7–2.4)

## 2021-09-18 LAB — PROTEIN, CSF: Total  Protein, CSF: 54 mg/dL — ABNORMAL HIGH (ref 15–45)

## 2021-09-18 LAB — GLUCOSE, CSF: Glucose, CSF: 128 mg/dL — ABNORMAL HIGH (ref 40–70)

## 2021-09-18 LAB — PHOSPHORUS: Phosphorus: 4.3 mg/dL (ref 2.5–4.6)

## 2021-09-18 IMAGING — RF Imaging study
2 series · 2 of 2 positions shown · non-contrast
Comparison: none

CLINICAL DATA: Patient with encephalopathy likely hemophagocytic
lymphohistiocytosis with CNS involvement. Request for image guided
lumbar puncture with intrathecal methotrexate and Solu-Cortef
injection.

[Series 1: fluoro_iodine 2fps_bw · 0.19mm/px · 1 of 1 slices shown]
[im 1/1]
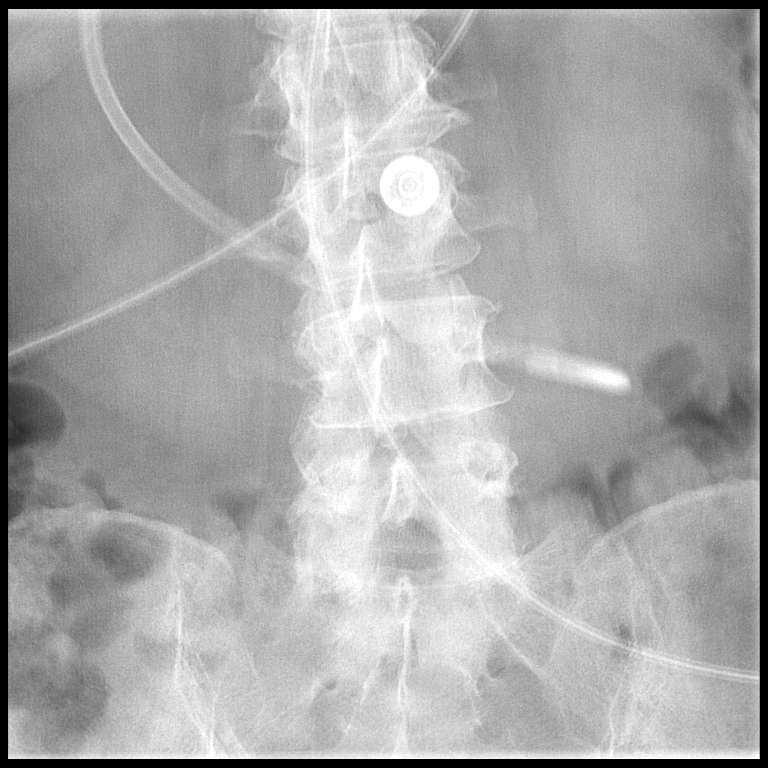

[Series 2: cp_standard · 0.17mm/px · 1 of 1 slices shown]
[im 1/1]
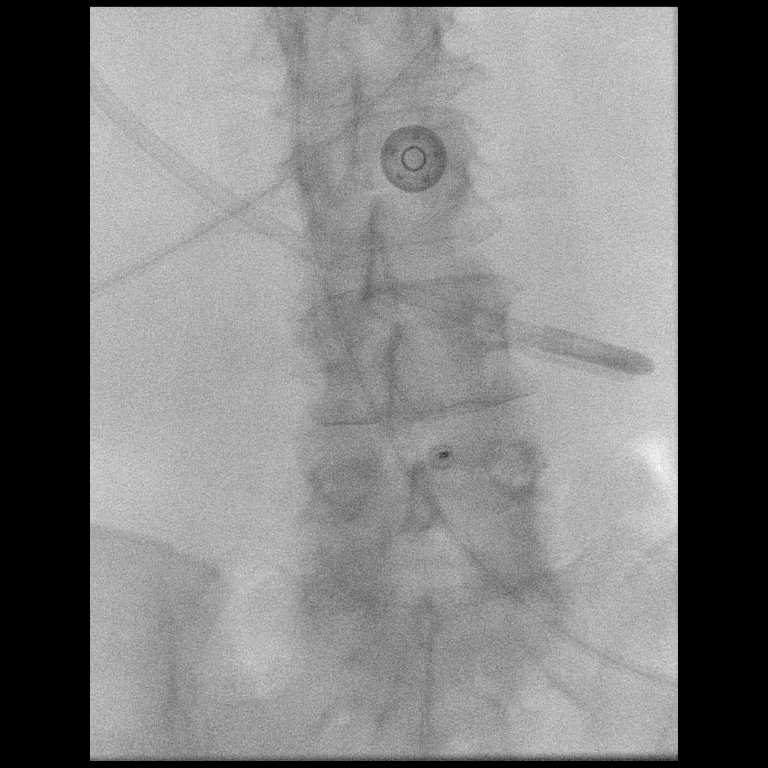

[2 of 2 positions shown; findings below may reference images not displayed]

EXAM:
FLUOROSCOPICALLY GUIDED LUMBAR PUNCTURE FOR INTRATHECAL CHEMOTHERAPY

FLUOROSCOPY:
Radiation Exposure Index (as provided by the fluoroscopic device):
11.2 mGy K[place]a

PROCEDURE:
Informed consent was obtained from the patient prior to the
procedure, including potential complications of headache, allergy,
and pain. With the patient prone, the lower back was prepped with
Betadine. 1% Lidocaine was used for local anesthesia. Lumbar
puncture was performed at the L4-5 level using a 20 gauge needle
with return of clear, slightly red tinged CSF. Opening pressure 24
cm H2O. 10 mL of CSF collected and sent for laboratory testing. 5 mL
of methotrexate plus Solu-Cortef in normal saline was injected into
the subarachnoid space. The patient tolerated the procedure well
without apparent complication.
IMPRESSION: Successful image guided lumbar puncture at L4-5 level with injection
of methotrexate and Solu-Cortef.

Performed by REZENDE

## 2021-09-18 MED ORDER — METHADONE HCL 10 MG PO TABS
5.0000 mg | ORAL_TABLET | Freq: Three times a day (TID) | ORAL | Status: DC
  Administered 2021-09-18 – 2021-09-20 (×8): 5 mg
  Filled 2021-09-18 (×9): qty 1

## 2021-09-18 MED ORDER — SODIUM ZIRCONIUM CYCLOSILICATE 10 G PO PACK
10.0000 g | PACK | Freq: Once | ORAL | Status: AC
  Administered 2021-09-18: 10 g
  Filled 2021-09-18 (×2): qty 1

## 2021-09-18 MED ORDER — SODIUM CHLORIDE 0.9 % IV SOLN
2.0000 g | Freq: Two times a day (BID) | INTRAVENOUS | Status: AC
  Administered 2021-09-19 – 2021-09-20 (×3): 2 g via INTRAVENOUS
  Filled 2021-09-18 (×3): qty 12.5

## 2021-09-18 MED ORDER — SODIUM CHLORIDE (PF) 0.9 % IJ SOLN
Freq: Once | INTRAMUSCULAR | Status: AC
  Filled 2021-09-18: qty 0.48

## 2021-09-18 MED ORDER — LIDOCAINE HCL (PF) 1 % IJ SOLN
2.0000 mL | Freq: Once | INTRAMUSCULAR | Status: AC
  Administered 2021-09-18: 2 mL via INTRADERMAL

## 2021-09-18 MED ORDER — PROCHLORPERAZINE EDISYLATE 10 MG/2ML IJ SOLN
10.0000 mg | Freq: Once | INTRAMUSCULAR | Status: AC
  Administered 2021-09-18: 10 mg via INTRAVENOUS
  Filled 2021-09-18: qty 2

## 2021-09-18 MED ORDER — ROMIPLOSTIM 250 MCG ~~LOC~~ SOLR
2.0000 ug/kg | SUBCUTANEOUS | Status: DC
  Administered 2021-09-18: 155 ug via SUBCUTANEOUS
  Filled 2021-09-18: qty 0.31

## 2021-09-18 NOTE — Progress Notes (Signed)
Pt transported on vent from 4N15 to radiology and back without any complications. RN at bedside, RT will continue to monitor.  ?

## 2021-09-18 NOTE — Procedures (Addendum)
Technically successful fluoro guided LP at L4-5 level with opening pressure of 24 cm H2O. ? ?10 cc of clear, slightly red tinged CSF sent to lab for analysis.  ? ?5 mL of 12 mg methotrexate + 50 mg solu-cortef in NS injected into intrathecal space easily.  ? ?No immediate post procedural complication. ? ?Please see imaging section of Epic for full dictation. ? ?Candiss Norse, PA-C ? ?

## 2021-09-18 NOTE — Progress Notes (Signed)
eLink Physician-Brief Progress Note ?Patient Name: Pershing CoxLivia S  ?DOB: 01/18/1961 ?MRN: [id number] ? ? ?Date of Service ? 09/18/2021  ?HPI/Events of Note ? Hyperkalemia - K+ = 5.6.   ?eICU Interventions ? Plan: ?Lokelma 10 gm per tube X 1 now.  ?Repeat BMP at 1 PM.   ? ? ? ?Intervention Category ?Major Interventions: Electrolyte abnormality - evaluation and management ? ?Lilymae Swiech Dennard NipEugene ?09/18/2021, 7:03 AM ?

## 2021-09-18 NOTE — Progress Notes (Signed)
Pt placed on PSV/CPAP by RT, pt tolerating well at this time. RN aware, RT will continue to monitor.  ?

## 2021-09-18 NOTE — Progress Notes (Signed)
? ?NAME:  Carrie [name], MRN:  [id number], DOB:  [birth year], LOS: 40 ?ADMISSION DATE:  08/22/2021, CONSULTATION DATE:  08/22/2021 ?REFERRING MD:  Darrick Meigs, CHIEF COMPLAINT:  Confusion  ? ?History of Present Illness:  ?61 year old woman with history of dermatomyositis and rheumatoid arthritis (on immunosuppressive therapy) who presented 3/12 with AMS, abdominal pain, constipation and volume depletion.  Found to have pancytopenia; seen by Hematology and initially felt to be related to mycophenolate/steroid therapy.  BMBx showed hypercellular marrow w/o blasts or malignant cells.  She developed worsening encephalopathy on 3/16 and Neuro was consulted.  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 ? ?On 3/17, patient had new onset seizure and required intubation for airway protection. PCCM consulted. She was transferred from [facility] to [facility] for LTM. Later felt to have [place] and started on dexamethasone and etoposide. Course complicated by Candidal fungemia likely from PICC line. ? ?Pertinent Medical History:  ?Rheumatoid arthritis, Dermatomyositis, Fatty liver, HLD, HTN, Depression, Anxiety ? 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 ?3/26 Candida fungemia for which started on micafungin. Dex and Etoposide stopped. [place] discussion with family - palliative care consulted  ?3/28 Febrile overnight for which cefepime added. WBC dropped to 1.4; started on Granix inj. ?3/31 restart dexamethasone ?4/01 Bronch/BAL ?4/03 restart etoposide ?4/04 change to pressure control to improve vent  synchrony ?4/05 Better tolerating pressure control. Increased peripheral edema. ?4/06 Continues to tolerate pressure control. ANC dropping, 100 today. Placed on Neutropenic Precautions. Ongoing peripheral edema. Nearing tracheostomy discussion. ?4/07 Tolerating pressure control.  Slightly more awake with weaned sedation, biting ETT with suctioning, withdrawing all 4 extremities to pain.  Per Onc, re-dose etoposide today.  Ongoing discussion re: extubation trial versus tracheostomy. ?4/10 Hgb drift with blood/clot passage from rectum. Received 2U PRBCs, 1U Plt ?4/11 Plt transfusion,tracheostomy, PICC line, etoposide ? ?Studies:  ?03/2021 labs >> ANA positive, Jo-1 neg, centromere neg, ds DNA neg, RNP positive, SSA/SSB neg, RF positive, chromatin Ab neg, CCP neg ?MRI 3/13 >> no acute findings ?3/17 bone marrow biopsy >> hypercellular bone marrow with trilineage hematopoiesis, comment: non-specific, follow up cytogenetic studies ?3/12 CT Abdomen and pelvis >> no acute findings, adrenal nodule, diverticulosis ?3/12 CT Head without contrast >> no acute findings ?3/16 EEG >> moderate diffuse encephalopathy of nonspecific etiology, no seizures or epileptiform discharges noted during the recording ?3/19 Echo >> EF 60 to 65%, mild LVH, grade 1 DD, mild/mod TR ?3/29 CT Abd >> b/l ASD, small GB stone, diffuse subcutaneous edema ?3/31 CT Head >> mild cortical atrophy ?4/06 CT Head >> NAICA ?4/09 MRI >> No acute or focal finding.  Generalized brain atrophy. Bilateral mastoid effusions. ? ? ?Micro:  ?3/16 ParvoB19 IgG pos, IgM neg ?3/16 EBV IgG pos, IgM neg ?3/16 CMV IgG pos, IgM neg ?3/16 HHV6 IgG pos ?3/17 Bone marrow biopsy > hypercellular with trilineage hematopoiesis ?3/23 Blood culture 1/4 > Candida glabrata ?4/01 BAL > (WBC/PMN and mononuclear), gram variable rods, gram positive cocci, > few normal resp flora, no staph/PsA ?4/01 PCP > ?4/01 BAL AFB > Negative ?4/01 BAL Fungus > NGTD ? ?Antibiotics:  ?Rocephin  3/12 ?Acyclovir 3/16>3/19  ?Unasyn  3/19 > 3/24 ?Micafungin 3/24 >  ?Cefepime 3/28 > 4/4, resumed 4/7.  ? ? ?Interim History / Subjective:  ?Continues to be on 200 fentanyl despite scheduled oxycodone. Afebrile.  ? ?Objective:  ?Blood pressure 133/84, pulse 100, temperature 98.1 ?F (36.7 ?C), temperature source Axillary, resp. rate 18, height _0  (1.6 m), weight 77.8 kg, SpO2 100 %. ?   ?Vent Mode: PSV;CPAP ?FiO2 (%):  [40 %] 40 % ?Set Rate:  [18 bmp] 18 bmp ?Vt Set:  [340 mL] 340 mL ?PEEP:  [5 cmH20] 5 cmH20 ?Pressure Support:  [10 cmH20] 10 cmH20  ? ?Intake/Output Summary (Last 24 hours) at 09/18/2021 0756 ?Last data filed at 09/18/2021 0600 ?Gross per 24 hour  ?Intake 4726.86 ml  ?Output 2250 ml  ?Net 2476.86 ml  ? ?Filed Weights  ? 09/16/21 0500 09/17/21 0456 09/18/21 0500  ?Weight: 73.9 kg 74.9 kg 77.8 kg  ? ?Physical Examination: ?Gen:      Intubated, sedated, acutely ill appearing ?HEENT:  trach to vent ?Lungs:    sounds of mechanical ventilation auscultated no wheezes or crackles ?CV:         RR no mrg ?Abd:      + bowel sounds; soft, non-tender; no palpable masses, no distension ?Ext:    No edema ?Skin:      Warm and dry; ecchymoses bilaterally ?Neuro:   sedated, RASS -3 ? ? ?Resolved Hospital Problem List:   ?Aspiration pneumonia, Septic shock, Shock liver, AKI from ATN in setting of sepsis ?Candidemia s/p 14 days micafunging ended 4/9 ?HCAP ?Assessment & Plan:  ? ?Rheumatoid arthritis, Dermatomyositis ?[place] vs. 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 Lab markers increased (IL-2 receptor). Repeat MRI Brain 4/9 NAICA. ?- Hematology/Oncology following, appreciate assistance ?- Treatment plans now include etoposide, intrathecal chemo (ITC), cyclosporine ?- Next etoposide 4/14 and Q Friday x 5 weeks ?- ITC to begin 4/13 (methotrexate, steroid) x 4 weeks ?- Oral cyclosporine started 4/11 ?- Dexamethasone taper, continue to administer prior to chemotherapy ?- Bactrim/voriconazole  ppx in the setting of chemo/immunosuppression ? ?Febrile Neutropenia ?- ID consulted, appreciate recommendations ?- Continue cefepime (resumed 4/7 - will complete 8 day course) ?- Continue immunosuppression ppx as above ? ?Acute metabolic encephalopathy, uncertain etiology ?New onset seizure. ?Hx of anxiety and depression ?Concern for AI process versus [place] with CNS involvement. Brain imaging/EEG unremarkable. ?- RASS goal 0 to -1 ?- Continue Abilify, Lexapro, Klonopin ?- Continue Keppra ?- no improvement in fentanyl requirements with scheduled oxycodone. QTC 368. Will start scheduled methadone to help with continues IV analgesia requirements and help improve mental status.  ? ?Pancytopenia ?Multifactorial in the setting of chemotherapy and [place]. ?- Trend CBC with differential ?- Monitor ANC ?- Neutropenic precautions ?- Granix per Onc, next 4/12 ?- Transfuse for Hgb < 7.0, Plt < 20 or hemodynamically significant bleeding (has received several units PRBCs/Plt) ? ?Acute hypoxic respiratory failure from HCAP ?- Continue PC vs. PSV, tolerating better than PRVC (less vent dyssynchrony) ?- Wean FiO2 for O2 sat > 90% ?- Daily WUA/SBT; not alert enough for safe extubation despite tolerating extended periods of vent weaning/PSV ?- s/p tracheostomy 4/11 ?- VAP bundle ?- Pulmonary hygiene ?- PAD protocol for sedation: Fentanyl for goal RASS 0 to -1. Adding methadone as above.  ? ?Steroid-induced hyperglycemia ?- Semglee ?- TF coverage, SSI resistant scale ?- CBGs Q4H ? ?Hx of HTN ?Hypervolemia ?Hypernatremia ?- Continue Lopressor ?- Continue FWF, may need to scale back given improvement in Na/volume status ?-  Monitor I&Os ? ?Pressure injury ?Stage 3 R hand - POA, stage 3 sacrum - POA, stage 2 Lt buttock, not present on admission. ?- WOC, appreciate assistance ? ?Goals of care ?- PMT consulted, following; appreciate assistance ?- Last GOC discussion 4/10, family wishes to continue aggressive care for now ? ? ?Best Practice:  (right click and "Reselect all SmartList Selections" daily)  ? ?Diet/type: tubefeeds ?DVT prophylaxis: SCDs ?GI prophylaxis: PPI ?Lines: see above ?Foley:  N/A and removal ordered  ?Code Status:  full code ?Last date of mul

## 2021-09-18 NOTE — Progress Notes (Signed)
Pt placed back on full vent support due apnea and low min ventilation. Pt tolerating well, MD aware, RN at bedside, RT will continue to monitor ?

## 2021-09-18 NOTE — Progress Notes (Addendum)
Carrie [name]   DOB:Jun 15, 1963   [id number]   [id number] ? ?Oncology follow up  ? ?Subjective: Patient underwent LP and intrathecal chemotherapy this afternoon without complications.  She received 2 units of platelets last night, and additional 1 unit this morning before procedure.  Per her notes, she had multiple episodes of rectal bleeding today.  She has diffuse anasarca, with decubitus ulcers.  No fever or other signs of bleeding.  Vital signs are stable. ? ? ?Objective:  ?Vitals:  ? 09/18/21 1500 09/18/21 1600  ?BP: 106/78   ?Pulse: 92   ?Resp: 16   ?Temp:  97.9 ?F (36.6 ?C)  ?SpO2: 100%   ?  Body mass index is 30.38 kg/m?. ? ?Intake/Output Summary (Last 24 hours) at 09/18/2021 1814 ?Last data filed at 09/18/2021 1600 ?Gross per 24 hour  ?Intake 3069.58 ml  ?Output 750 ml  ?Net 2319.58 ml  ? ? ? Patient sedated, on the vent, with tracheostomy  ?sclerae unicteric ? Diffuse anasarca, ecchymosis on all extremities ? No peripheral adenopathy ? Lungs clear -- no rales or rhonchi ? Heart regular rate and rhythm ? Abdomen benign ? ?  ? ?CBG (last 3)  ?Recent Labs  ?  09/18/21 ?0746 09/18/21 ?1134 09/18/21 ?1540  ?GLUCAP 220* 303* 192*  ? ? ? ?Labs:  ?Urine Studies ?No results for input(s): UHGB, CRYS in the last 72 hours. ? ?Invalid input(s): UACOL, UAPR, USPG, UPH, UTP, UGL, UKET, UBIL, UNIT, UROB, ULEU, UEPI, UWBC, URBC, UBAC, CAST, UCOM, BILUA ? ?Basic Metabolic Panel: ?Recent Labs  ?Lab 09/12/21 ?0650 09/13/21 ?0329 09/14/21 ?0438 09/15/21 ?0246 09/16/21 ?[id number] 09/17/21 ?0451 09/18/21 ?0522  ?NA 151* 152* 152* 148* 147* 144 140  ?K 5.0 5.1 4.5 4.9 4.6 5.1 5.6*  ?CL 114* 117* 120* 117* 118* 116* 112*  ?CO2 30 28 25 24 22 22 22   ?GLUCOSE 252* 107* 178* 275* 207* 250* 165*  ?BUN 121* 117* 106* 105* 112* 115* 122*  ?CREATININE 0.97 0.91 0.92 0.92 0.93 0.95 0.95  ?CALCIUM 9.4 9.2 9.1 9.0 8.5* 8.4* 8.4*  ?MG 1.9 1.9  --   --   --   --  1.7  ?PHOS 3.3  --   --   --   --   --  4.3  ? ?GFR ?Estimated Creatinine  Clearance: 60.7 mL/min (by C-G formula based on SCr of 0.95 mg/dL). ?Liver Function Tests: ?Recent Labs  ?Lab 09/14/21 ?[id number] 09/15/21 ?0246 09/16/21 ?0313 09/17/21 ?0451 09/18/21 ?0522  ?AST 64* 64* 430* 100* 145*  ?ALT 103* 94* 441* 283* 269*  ?ALKPHOS 228* 209* 488* 384* 483*  ?BILITOT 1.0 0.9 1.7* 1.1 1.2  ?PROT 5.3* 5.6* 4.5* 4.6* 4.8*  ?ALBUMIN <1.5* <1.5* <1.5* <1.5* <1.5*  ? ?No results for input(s): LIPASE, AMYLASE in the last 168 hours. ?No results for input(s): AMMONIA in the last 168 hours. ?Coagulation profile ?Recent Labs  ?Lab 09/15/21 ?2147  ?INR 1.2  ? ? ?CBC: ?Recent Labs  ?Lab 09/15/21 ?0246 09/15/21 ?1926 09/16/21 ?0313 09/17/21 ?0451 09/17/21 ?2235 09/18/21 ?0522  ?WBC 0.1* 0.2* 0.2* <0.1* <0.1* 0.1*  ?NEUTROABS 0.0*  --  0.0* TOO FEW TO COUNT, SMEAR AVAILABLE FOR REVIEW Not Measured Not Measured  ?HGB 7.5* 5.9* 7.7* 7.0* 8.6* 7.9*  ?HCT 22.8* 18.8* 23.6* 20.8* 25.6* 23.6*  ?MCV 87.0 88.7 90.1 90.0 90.8 91.1  ?PLT 24* 81* 77* 21* 10* 93*  ? ?Cardiac Enzymes: ?No results for input(s): CKTOTAL, CKMB, CKMBINDEX, TROPONINI in the last 168 hours. ?BNP: ?Invalid input(s): POCBNP ?  CBG: ?Recent Labs  ?Lab 09/17/21 ?2303 09/18/21 ?0313 09/18/21 ?0746 09/18/21 ?1134 09/18/21 ?1540  ?GLUCAP 94 93 220* 303* 192*  ? ?D-Dimer ?No results for input(s): DDIMER in the last 72 hours. ?Hgb A1c ?No results for input(s): HGBA1C in the last 72 hours. ?Lipid Profile ?No results for input(s): CHOL, HDL, LDLCALC, TRIG, CHOLHDL, LDLDIRECT in the last 72 hours. ?Thyroid function studies ?No results for input(s): TSH, T4TOTAL, T3FREE, THYROIDAB in the last 72 hours. ? ?Invalid input(s): FREET3 ?Anemia work up ?No results for input(s): VITAMINB12, FOLATE, FERRITIN, TIBC, IRON, RETICCTPCT in the last 72 hours. ?Microbiology ?Recent Results (from the past 240 hour(s))  ?CSF culture w Gram Stain     Status: None (Preliminary result)  ? Collection Time: 09/18/21  2:22 PM  ? Specimen: PATH Cytology CSF; Cerebrospinal Fluid   ?Result Value Ref Range Status  ? Specimen Description CSF  Final  ? Special Requests NONE  Final  ? Gram Stain   Final  ?  CYTOSPIN SMEAR ?WBC PRESENT,BOTH PMN AND MONONUCLEAR ?NO ORGANISMS SEEN ?Performed at [facility], [place] [address], [place], [place] [address] ?  ? Culture PENDING  Incomplete  ? Report Status PENDING  Incomplete  ? ? ? ? ?Studies:  ?DG FL GUIDED THERAPUTIC LUMBAR PUNCTURE ? 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 Request for image guided lumbar puncture with intrathecal methotrexate and Solu-Cortef injection. EXAM: FLUOROSCOPICALLY GUIDED LUMBAR PUNCTURE FOR INTRATHECAL CHEMOTHERAPY FLUOROSCOPY: Radiation Exposure Index (as provided by the fluoroscopic device): 11.2 mGy Kerma PROCEDURE: Informed consent was obtained from the patient prior to the procedure, including potential complications of headache, allergy, and pain. With the patient prone, the lower back was prepped with Betadine. 1% Lidocaine was used for local anesthesia. Lumbar puncture was performed at the L4-5 level using a 20 gauge needle with return of clear, slightly red tinged CSF. Opening pressure 24 cm H2O. 10 mL of CSF collected and sent for laboratory testing. 5 mL of methotrexate plus Solu-Cortef in normal saline was injected into the subarachnoid space. The patient tolerated the procedure well without apparent complication. IMPRESSION: Successful image guided lumbar puncture at L4-5 level with injection of methotrexate and Solu-Cortef. Performed by Candiss Norse, PA-C Electronically Signed   By: Marijo Conception M.D.   On: 09/18/2021 14:32   ? ?Assessment: 61 y.o. ? ?Acute encephalopathy, likely [place] with CNS involvement  ?[place]  ?Pancytopenia secondary to [facility] and chemo  ?Hypertension ?Rheumatoid arthritis/dermatomyositis ?Depression and anxiety ?Stage III sacral ulcer ?Folate deficiency  ?Transaminitis ?Acute hypoxic  respiratory failure ?Aspiration pneumonia ?Seizure, resolved ?Anasarca ?  ?Recommendations: ?-lab reviewed, she responded to platelet transfusion, but overall she has persistent severe pancytopenia.  Agree with platelet transfusion if platelet less than 20, or active bleeding ?-Due to her severe pancytopenia, recurrent rectal bleeding, I will postpone her chemo from tomorrow to next Monday.  Continue Granix daily injection, but will hold today before and as a day of chemo ?-I will start her on Nplate tomorrow for her severe thrombocytopenia ?-plan to taper dexa on Monday 4/17 ?-Continue prophylactic antibiotics, and oral cyclosporine.  Today's cyclosporine level still pending, will adjust the dose if needed ?-May consider reduce her IV fluids or oral fluids, or give lasix due to her anasarca, will let ICU team decide  ?-I spoke with her niece Bonnita Nasuti today  ?-I will f/u on Monday morning  ? ? ?Truitt Merle, MD ?09/18/2021  6:14 PM ? ?

## 2021-09-19 ENCOUNTER — Encounter: Payer: Self-pay | Admitting: Hematology

## 2021-09-19 DIAGNOSIS — G0490 Encephalitis and encephalomyelitis, unspecified: Secondary | ICD-10-CM | POA: Diagnosis not present

## 2021-09-19 DIAGNOSIS — G9340 Encephalopathy, unspecified: Secondary | ICD-10-CM | POA: Diagnosis not present

## 2021-09-19 DIAGNOSIS — D761 Hemophagocytic lymphohistiocytosis: Secondary | ICD-10-CM | POA: Diagnosis not present

## 2021-09-19 DIAGNOSIS — B377 Candidal sepsis: Secondary | ICD-10-CM | POA: Diagnosis not present

## 2021-09-19 LAB — PREPARE PLATELET PHERESIS
Unit division: 0
Unit division: 0
Unit division: 0

## 2021-09-19 LAB — CBC
HCT: 20.2 % — ABNORMAL LOW (ref 36.0–46.0)
Hemoglobin: 6.6 g/dL — CL (ref 12.0–15.0)
MCH: 30.6 pg (ref 26.0–34.0)
MCHC: 32.7 g/dL (ref 30.0–36.0)
MCV: 93.5 fL (ref 80.0–100.0)
Platelets: 30 10*3/uL — ABNORMAL LOW (ref 150–400)
RBC: 2.16 MIL/uL — ABNORMAL LOW (ref 3.87–5.11)
RDW: 17.5 % — ABNORMAL HIGH (ref 11.5–15.5)
WBC: <0.1 10*3/uL — CL (ref 4.0–10.5)
nRBC: 0 % (ref 0.0–0.2)

## 2021-09-19 LAB — GLUCOSE, CAPILLARY
Glucose-Capillary: 166 mg/dL — ABNORMAL HIGH (ref 70–99)
Glucose-Capillary: 173 mg/dL — ABNORMAL HIGH (ref 70–99)
Glucose-Capillary: 165 mg/dL — ABNORMAL HIGH (ref 70–99)
Glucose-Capillary: 158 mg/dL — ABNORMAL HIGH (ref 70–99)
Glucose-Capillary: 147 mg/dL — ABNORMAL HIGH (ref 70–99)

## 2021-09-19 LAB — BASIC METABOLIC PANEL
Anion gap: 8 (ref 5–15)
BUN: 143 mg/dL — ABNORMAL HIGH (ref 8–23)
CO2: 21 mmol/L — ABNORMAL LOW (ref 22–32)
Calcium: 8.7 mg/dL — ABNORMAL LOW (ref 8.9–10.3)
Chloride: 111 mmol/L (ref 98–111)
Creatinine, Ser: 1.18 mg/dL — ABNORMAL HIGH (ref 0.44–1.00)
GFR, Estimated: 52 mL/min — ABNORMAL LOW (ref >60)
Glucose, Bld: 187 mg/dL — ABNORMAL HIGH (ref 70–99)
Potassium: 5.2 mmol/L — ABNORMAL HIGH (ref 3.5–5.1)
Sodium: 140 mmol/L (ref 135–145)

## 2021-09-19 LAB — CBC WITH DIFFERENTIAL/PLATELET
HCT: 24.6 % — ABNORMAL LOW (ref 36.0–46.0)
Hemoglobin: 8.2 g/dL — ABNORMAL LOW (ref 12.0–15.0)
MCH: 30.7 pg (ref 26.0–34.0)
MCHC: 33.3 g/dL (ref 30.0–36.0)
MCV: 92.1 fL (ref 80.0–100.0)
Platelets: 49 10*3/uL — ABNORMAL LOW (ref 150–400)
RBC: 2.67 MIL/uL — ABNORMAL LOW (ref 3.87–5.11)
RDW: 17.6 % — ABNORMAL HIGH (ref 11.5–15.5)
WBC: <0.1 10*3/uL — CL (ref 4.0–10.5)
nRBC: 0 % (ref 0.0–0.2)

## 2021-09-19 LAB — MAGNESIUM: Magnesium: 1.7 mg/dL (ref 1.7–2.4)

## 2021-09-19 LAB — BPAM PLATELET PHERESIS
Blood Product Expiration Date: 202304152359
Blood Product Expiration Date: 202304162359
Blood Product Expiration Date: 202304162359
ISSUE DATE / TIME: 202304130117
ISSUE DATE / TIME: 202304130117
ISSUE DATE / TIME: 202304131017
Unit Type and Rh: 5100
Unit Type and Rh: 6200
Unit Type and Rh: 6200

## 2021-09-19 LAB — HEMOGLOBIN AND HEMATOCRIT, BLOOD
HCT: 24.7 % — ABNORMAL LOW (ref 36.0–46.0)
Hemoglobin: 8.4 g/dL — ABNORMAL LOW (ref 12.0–15.0)

## 2021-09-19 LAB — CYTOLOGY - NON PAP

## 2021-09-19 LAB — PREPARE RBC (CROSSMATCH)

## 2021-09-19 MED ORDER — SODIUM ZIRCONIUM CYCLOSILICATE 10 G PO PACK
10.0000 g | PACK | Freq: Once | ORAL | Status: AC
  Administered 2021-09-19: 10 g
  Filled 2021-09-19: qty 1

## 2021-09-19 MED ORDER — MAGNESIUM SULFATE 2 GM/50ML IV SOLN
2.0000 g | Freq: Once | INTRAVENOUS | Status: AC
  Administered 2021-09-19: 2 g via INTRAVENOUS
  Filled 2021-09-19: qty 50

## 2021-09-19 MED ORDER — SODIUM CHLORIDE 0.9% IV SOLUTION: Freq: Once | INTRAVENOUS | Status: AC

## 2021-09-19 NOTE — Progress Notes (Signed)
Pharmacy Electrolyte Replacement ? ?Recent Labs: ? ?Recent Labs  ?  09/18/21 ?0522 09/18/21 ?1807 09/19/21 ?4098  ?K 5.6*   < > 5.2*  ?MG 1.7  --  1.7  ?PHOS 4.3  --   --   ?CREATININE 0.95   < > 1.18*  ? < > = values in this interval not displayed.  ? ? ?Low Critical Values (K </= 2.5, Phos </= 1, Mg </= 1) Present: ?None ? ?MD Contacted: n/a - no critical values noted ? ?Plan: Mag sulfate 2g IV x 1 ?F/u hyperkalemia plan with MD (on PPX Bactrim) ?Recheck with AM labs per protocol ? ? ?Leia Alf, PharmD, BCPS ?Please check AMION for all [facility] contact numbers ?Clinical Pharmacist ?09/19/2021 2:02 PM ? ?

## 2021-09-19 NOTE — Progress Notes (Signed)
Date and time results received: 09/19/21 1640 ?(use smartphrase ".now" to insert current time) ? ?Test: CBC  ?Critical Value:  ?Hemoglobin 6.6  ?WBC <0.1 ? ?Name of Provider Notified: Merrily Pew, MD ? ?Orders Received? Or Actions Taken?: Awaiting new orders. ?

## 2021-09-19 NOTE — Progress Notes (Signed)
? ?NAME:  Carrie [name], MRN:  [id number], DOB:  1960/08/06, LOS: 38 ?ADMISSION DATE:  08/16/2021, CONSULTATION DATE:  08/22/2021 ?REFERRING MD:  Darrick Meigs, CHIEF COMPLAINT:  Confusion  ? ?History of Present Illness:  ?61 year old woman with history of dermatomyositis and rheumatoid arthritis (on immunosuppressive therapy) who presented 3/12 with AMS, abdominal pain, constipation and volume depletion.  Found to have pancytopenia; seen by Hematology and initially felt to be related to mycophenolate/steroid therapy.  BMBx showed hypercellular marrow w/o blasts or malignant cells.  She developed worsening encephalopathy on 3/16 and Neuro was consulted.  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 ? ?On 3/17, patient had new onset seizure and required intubation for airway protection. PCCM consulted. She was transferred from [facility] to [facility] for LTM. Later felt to have [place] and started on dexamethasone and etoposide. Course complicated by Candidal fungemia likely from PICC line. ? ?Pertinent Medical History:  ?Rheumatoid arthritis, Dermatomyositis, Fatty liver, HLD, HTN, Depression, Anxiety ? 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 ?3/26 Candida fungemia for which started on micafungin. Dex and Etoposide stopped. [place] discussion with family - palliative care consulted  ?3/28 Febrile overnight for which cefepime added. WBC dropped to 1.4; started on Granix inj. ?3/31 restart dexamethasone ?4/01 Bronch/BAL ?4/03 restart etoposide ?4/04 change to pressure control to improve vent  synchrony ?4/05 Better tolerating pressure control. Increased peripheral edema. ?4/06 Continues to tolerate pressure control. ANC dropping, 100 today. Placed on Neutropenic Precautions. Ongoing peripheral edema. Nearing tracheostomy discussion. ?4/07 Tolerating pressure control.  Slightly more awake with weaned sedation, biting ETT with suctioning, withdrawing all 4 extremities to pain.  Per Onc, re-dose etoposide today.  Ongoing discussion re: extubation trial versus tracheostomy. ?4/10 Hgb drift with blood/clot passage from rectum. Received 2U PRBCs, 1U Plt ?4/11 Plt transfusion,tracheostomy, PICC line, etoposide ? ?Studies:  ?03/2021 labs >> ANA positive, Jo-1 neg, centromere neg, ds DNA neg, RNP positive, SSA/SSB neg, RF positive, chromatin Ab neg, CCP neg ?MRI 3/13 >> no acute findings ?3/17 bone marrow biopsy >> hypercellular bone marrow with trilineage hematopoiesis, comment: non-specific, follow up cytogenetic studies ?3/12 CT Abdomen and pelvis >> no acute findings, adrenal nodule, diverticulosis ?3/12 CT Head without contrast >> no acute findings ?3/16 EEG >> moderate diffuse encephalopathy of nonspecific etiology, no seizures or epileptiform discharges noted during the recording ?3/19 Echo >> EF 60 to 65%, mild LVH, grade 1 DD, mild/mod TR ?3/29 CT Abd >> b/l ASD, small GB stone, diffuse subcutaneous edema ?3/31 CT Head >> mild cortical atrophy ?4/06 CT Head >> NAICA ?4/09 MRI >> No acute or focal finding.  Generalized brain atrophy. Bilateral mastoid effusions. ? ? ?Micro:  ?3/16 ParvoB19 IgG pos, IgM neg ?3/16 EBV IgG pos, IgM neg ?3/16 CMV IgG pos, IgM neg ?3/16 HHV6 IgG pos ?3/17 Bone marrow biopsy > hypercellular with trilineage hematopoiesis ?3/23 Blood culture 1/4 > Candida glabrata ?4/01 BAL > (WBC/PMN and mononuclear), gram variable rods, gram positive cocci, > few normal resp flora, no staph/PsA ?4/01 PCP > ?4/01 BAL AFB > Negative ?4/01 BAL Fungus > NGTD ? ?Antibiotics:  ?Rocephin  3/12 ?Acyclovir 3/16>3/19  ?Unasyn  3/19 > 3/24 ?Micafungin 3/24 >  ?Cefepime 3/28 > 4/4, resumed 4/7.  ? ? ?Interim History / Subjective:  ?Patient was placed on trach collar this morning, tolerating it well so far ?Mental status remain poor ?Remained afebrile ? ?Continues to have large clots per rectum ? ?Objective:  ?Blood pressure (!) 138/97, pulse 99, temperature 98 ?F (36.7 ?C), temperature source Axillary, resp. rate 19, height 5' 3" (1.6 m), weight 81.3 kg, SpO2 100 %. ?   ?Vent Mode: PSV ?FiO2 (%):  [35 %-40 %] 35 % ?Set Rate:  [18 bmp] 18 bmp ?Vt Set:  [310 mL] 310 mL ?PEEP:  [5 cmH20] 5 cmH20 ?Pressure Support:  [10 cmH20] 10 cmH20 ?Plateau Pressure:  [10 cmH20] 10 cmH20  ? ?Intake/Output Summary (Last 24 hours) at 09/19/2021 1329 ?Last data filed at 09/19/2021 1200 ?Gross per 24 hour  ?Intake 5823.46 ml  ?Output 1475 ml  ?Net 4348.46 ml  ? ?Filed Weights  ? 09/17/21 0456 09/18/21 0500 09/19/21 0600  ?Weight: 74.9 kg 77.8 kg 81.3 kg  ? ?Physical Examination: ?  ?Physical exam: ?General: Crtitically ill-appearing female, s/p trach ?HEENT: [place]/AT, eyes anicteric.  On trach collar ?Neuro: Encephalopathic, eyes closed, does not open, not following commands ?Chest: Coarse breath sounds, no wheezes or rhonchi ?Heart: Regular rate and rhythm, no murmurs or gallops ?Abdomen: Soft, nontender, nondistended, bowel sounds present ?Skin: No rash ? ? ?Resolved Hospital Problem List:   ?Aspiration pneumonia, Septic shock, Shock liver, AKI from ATN in setting of sepsis ?Candidemia s/p 14 days micafunging ended 4/9 ?HCAP ?Assessment & Plan:  ? ?Rheumatoid arthritis, Dermatomyositis ?[place] vs. 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 Lab markers increased (IL-2 receptor). Repeat MRI Brain 4/9 showed no acute changes ?Hematology/Oncology following, appreciate assistance ?Treatment plans now include etoposide, intrathecal chemo (ITC), cyclosporine ?Next episode was  supposed to be today but it was postponed till Monday, which will be continued for 5 weeks  ?Yesterday patient was given intrathecal methotrexate and steroid which will be continued for 4 weeks ?Oral cyclosporine started 4/11 ?Dexamethasone taper, continue to administer prior to chemotherapy ?Continue Bactrim/voriconazole ppx in the setting of chemo/immunosuppression ? ?Febrile Neutropenia ?Appreciate ID recommendations ?ANC remain unmeasurable ?Restarted back on cefepime to complete 8-day course of therapy ? Continue immunosuppression ppx as above ? ?Acute metabolic encephalopathy, uncertain etiology ?New onset seizure. ?Hx of anxiety and depression ?Concern for autoimmune process versus [place] with CNS involvement. Brain imaging/EEG unremarkable. ?Minimize sedation with RASS goal 0/-1 ?Continue Abilify, Lexapro, Klonopin ?Continue Keppra ? ?Pancytopenia ?Multifactorial in the setting of chemotherapy and [place]. ?Trend CBC with differential ?Monitor ANC ?Neutropenic precautions ?Granix per Onc ?Transfuse for Hgb < 7.0, Plt < 20 or hemodynamically significant bleeding (has received several units PRBCs/Plt) ? ?Lower GI bleeding in the setting of thrombocytopenia ?Monitor H&H and transfuse to keep it above 7 ? ?Acute hypoxic respiratory failure from HCAP ?Continue PC vs. PSV, tolerating better than PRVC (less vent dyssynchrony) ?Wean FiO2 for O2 sat > 90% ?S/p tracheostomy, now on trach collar ?VAP bundle ?Pulmonary hygiene ? ?Steroid-induced hyperglycemia ?Continue long-acting and sliding scale insulin with CBG goal 140-180 ? ?Hypervolemia ?Hypernatremia ?Continue Lopressor ?Continue FWF, may need to scale back given improvement in Na/volume status ?Monitor I&Os ?Gentle diuresis ? ?Pressure injury ?Stage 3 R hand - POA, stage 3 sacrum - POA, stage 2 Lt buttock, not present on admission. ?WOC, appreciate assistance ? ? ?Best Practice: (right click and "Reselect all SmartList Selections" daily)  ? ?  Diet/type: tubefeeds ?DVT  prophylaxis: SCDs ?GI prophylaxis: PPI ?Lines: see above ?Foley:  N/A and removal ordered  ?Code Status:  full code ?Last date of multidisciplinary goals of care discussion: S/p GOC discussion 4/10 with PMT/CCM/Onc, con

## 2021-09-19 NOTE — Progress Notes (Signed)
Placed patient on 35% trach collar, will continue to monitor patient.  ?

## 2021-09-19 NOTE — Progress Notes (Signed)
SLP Cancellation Note ? ?Patient Details ?Name: Carrie [name] ?MRN: [id number] ?DOB: 06-10-1960 ? ? ?Cancelled treatment:       Reason Eval/Treat Not Completed: Patient not medically ready (Pt is currently on ATC, but with a lot of secretions per RN. SLP will follow up for PMV on subsequent date.) ? ?Dorma Altman I. Hardin Negus, [place], CCC-SLP ?Acute Rehabilitation Services ?Office number [phone] ?Pager [phone] ? ?Horton Marshall ?09/19/2021, 3:42 PM ?

## 2021-09-20 DIAGNOSIS — G9340 Encephalopathy, unspecified: Secondary | ICD-10-CM | POA: Diagnosis not present

## 2021-09-20 DIAGNOSIS — B377 Candidal sepsis: Secondary | ICD-10-CM | POA: Diagnosis not present

## 2021-09-20 DIAGNOSIS — D761 Hemophagocytic lymphohistiocytosis: Secondary | ICD-10-CM | POA: Diagnosis not present

## 2021-09-20 LAB — BASIC METABOLIC PANEL
Anion gap: 9 (ref 5–15)
BUN: 160 mg/dL — ABNORMAL HIGH (ref 8–23)
CO2: 19 mmol/L — ABNORMAL LOW (ref 22–32)
Calcium: 8.5 mg/dL — ABNORMAL LOW (ref 8.9–10.3)
Chloride: 110 mmol/L (ref 98–111)
Creatinine, Ser: 1.31 mg/dL — ABNORMAL HIGH (ref 0.44–1.00)
GFR, Estimated: 46 mL/min — ABNORMAL LOW (ref >60)
Glucose, Bld: 122 mg/dL — ABNORMAL HIGH (ref 70–99)
Potassium: 5.7 mmol/L — ABNORMAL HIGH (ref 3.5–5.1)
Sodium: 138 mmol/L (ref 135–145)

## 2021-09-20 LAB — CBC
HCT: 23.9 % — ABNORMAL LOW (ref 36.0–46.0)
Hemoglobin: 7.8 g/dL — ABNORMAL LOW (ref 12.0–15.0)
MCH: 29.9 pg (ref 26.0–34.0)
MCHC: 32.6 g/dL (ref 30.0–36.0)
MCV: 91.6 fL (ref 80.0–100.0)
Platelets: 14 10*3/uL — CL (ref 150–400)
RBC: 2.61 MIL/uL — ABNORMAL LOW (ref 3.87–5.11)
RDW: 16.7 % — ABNORMAL HIGH (ref 11.5–15.5)
WBC: <0.1 10*3/uL — CL (ref 4.0–10.5)
nRBC: 0 % (ref 0.0–0.2)

## 2021-09-20 LAB — GLUCOSE, CAPILLARY
Glucose-Capillary: 124 mg/dL — ABNORMAL HIGH (ref 70–99)
Glucose-Capillary: 133 mg/dL — ABNORMAL HIGH (ref 70–99)
Glucose-Capillary: 159 mg/dL — ABNORMAL HIGH (ref 70–99)
Glucose-Capillary: 180 mg/dL — ABNORMAL HIGH (ref 70–99)
Glucose-Capillary: 187 mg/dL — ABNORMAL HIGH (ref 70–99)
Glucose-Capillary: 256 mg/dL — ABNORMAL HIGH (ref 70–99)
Glucose-Capillary: 91 mg/dL (ref 70–99)

## 2021-09-20 MED ORDER — TBO-FILGRASTIM 480 MCG/0.8ML ~~LOC~~ SOSY
480.0000 ug | PREFILLED_SYRINGE | Freq: Every day | SUBCUTANEOUS | Status: DC
  Administered 2021-09-20 – 2021-09-21 (×2): 480 ug via SUBCUTANEOUS
  Filled 2021-09-20 (×2): qty 0.8

## 2021-09-20 MED ORDER — ALBUMIN HUMAN 25 % IV SOLN
12.5000 g | Freq: Once | INTRAVENOUS | Status: AC
  Administered 2021-09-20: 12.5 g via INTRAVENOUS
  Filled 2021-09-20: qty 50

## 2021-09-20 MED ORDER — SODIUM ZIRCONIUM CYCLOSILICATE 10 G PO PACK
10.0000 g | PACK | ORAL | Status: AC
  Administered 2021-09-20: 10 g
  Filled 2021-09-20: qty 1

## 2021-09-20 MED ORDER — SODIUM CHLORIDE 0.9% IV SOLUTION
Freq: Once | INTRAVENOUS | Status: DC
Start: 2021-09-20 — End: 2021-09-22

## 2021-09-20 MED ORDER — FENTANYL CITRATE PF 50 MCG/ML IJ SOSY
50.0000 ug | PREFILLED_SYRINGE | INTRAMUSCULAR | Status: DC | PRN
Start: 2021-09-20 — End: 2021-09-22
  Administered 2021-09-20 – 2021-09-21 (×3): 50 ug via INTRAVENOUS
  Filled 2021-09-20 (×4): qty 1

## 2021-09-20 NOTE — Progress Notes (Signed)
? ?NAME:  Carrie [name], MRN:  [id number], DOB:  11/26/1960, LOS: 39 ?ADMISSION DATE:  08/10/2021, CONSULTATION DATE:  08/22/2021 ?REFERRING MD:  Darrick Meigs, CHIEF COMPLAINT:  Confusion  ? ?History of Present Illness:  ?61 year old woman with history of dermatomyositis and rheumatoid arthritis (on immunosuppressive therapy) who presented 3/12 with AMS, abdominal pain, constipation and volume depletion.  Found to have pancytopenia; seen by Hematology and initially felt to be related to mycophenolate/steroid therapy.  BMBx showed hypercellular marrow w/o blasts or malignant cells.  She developed worsening encephalopathy on 3/16 and Neuro was consulted.  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 ? ?On 3/17, patient had new onset seizure and required intubation for airway protection. PCCM consulted. She was transferred from [facility] to [facility] for LTM. Later felt to have [place] and started on dexamethasone and etoposide. Course complicated by Candidal fungemia likely from PICC line. ? ?Pertinent Medical History:  ?Rheumatoid arthritis, Dermatomyositis, Fatty liver, HLD, HTN, Depression, Anxiety ? 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 ?3/26 Candida fungemia for which started on micafungin. Dex and Etoposide stopped. [place] discussion with family - palliative care consulted  ?3/28 Febrile overnight for which cefepime added. WBC dropped to 1.4; started on Granix inj. ?3/31 restart dexamethasone ?4/01 Bronch/BAL ?4/03 restart etoposide ?4/04 change to pressure control to improve vent  synchrony ?4/05 Better tolerating pressure control. Increased peripheral edema. ?4/06 Continues to tolerate pressure control. ANC dropping, 100 today. Placed on Neutropenic Precautions. Ongoing peripheral edema. Nearing tracheostomy discussion. ?4/07 Tolerating pressure control.  Slightly more awake with weaned sedation, biting ETT with suctioning, withdrawing all 4 extremities to pain.  Per Onc, re-dose etoposide today.  Ongoing discussion re: extubation trial versus tracheostomy. ?4/10 Hgb drift with blood/clot passage from rectum. Received 2U PRBCs, 1U Plt ?4/11 Plt transfusion,tracheostomy, PICC line, etoposide ? ?Studies:  ?03/2021 labs >> ANA positive, Jo-1 neg, centromere neg, ds DNA neg, RNP positive, SSA/SSB neg, RF positive, chromatin Ab neg, CCP neg ?MRI 3/13 >> no acute findings ?3/17 bone marrow biopsy >> hypercellular bone marrow with trilineage hematopoiesis, comment: non-specific, follow up cytogenetic studies ?3/12 CT Abdomen and pelvis >> no acute findings, adrenal nodule, diverticulosis ?3/12 CT Head without contrast >> no acute findings ?3/16 EEG >> moderate diffuse encephalopathy of nonspecific etiology, no seizures or epileptiform discharges noted during the recording ?3/19 Echo >> EF 60 to 65%, mild LVH, grade 1 DD, mild/mod TR ?3/29 CT Abd >> b/l ASD, small GB stone, diffuse subcutaneous edema ?3/31 CT Head >> mild cortical atrophy ?4/06 CT Head >> NAICA ?4/09 MRI >> No acute or focal finding.  Generalized brain atrophy. Bilateral mastoid effusions. ? ? ?Micro:  ?3/16 ParvoB19 IgG pos, IgM neg ?3/16 EBV IgG pos, IgM neg ?3/16 CMV IgG pos, IgM neg ?3/16 HHV6 IgG pos ?3/17 Bone marrow biopsy > hypercellular with trilineage hematopoiesis ?3/23 Blood culture 1/4 > Candida glabrata ?4/01 BAL > (WBC/PMN and mononuclear), gram variable rods, gram positive cocci, > few normal resp flora, no staph/PsA ?4/01 PCP > ?4/01 BAL AFB > Negative ?4/01 BAL Fungus > NGTD ? ?Antibiotics:  ?Rocephin  3/12 ?Acyclovir 3/16>3/19  ?Unasyn  3/19 > 3/24 ?Micafungin 3/24 >  ?Cefepime 3/28 > 4/4, resumed 4/7.  ? ? ?Interim History / Subjective:  ?Remained afebrile ?Has been on trach collar since yesterday morning, tolerating it well so far ?Mental status remain poor ?Required 1 unit PRBC for hemoglobin of 6.6 ? ?Continues to have large clots per rectum ? ?Objective:  ?Blood pressure 101/83, pulse 90, temperature 98 ?F (36.7 ?C), temperature source Axillary, resp. rate 19, height _0  (1.6 m), weight 81.3 kg, SpO2 100 %. ?   ?FiO2 (%):  [35 %] 35 %  ? ?Intake/Output Summary (Last 24 hours) at 09/20/2021 0924 ?Last data filed at 09/20/2021 0911 ?Gross per 24 hour  ?Intake 4641.44 ml  ?Output 1500 ml  ?Net 3141.44 ml  ? ?Filed Weights  ? 09/17/21 0456 09/18/21 0500 09/19/21 0600  ?Weight: 74.9 kg 77.8 kg 81.3 kg  ? ?Physical Examination: ?Physical exam: ?General: Crtitically ill-appearing female, s/p trach ?HEENT: [place]/AT, eyes anicteric.  On trach collar ?Neuro: Encephalopathic, eyes open, not following commands ?Chest: Coarse breath sounds, no wheezes or rhonchi ?Heart: Regular rate and rhythm, no murmurs or gallops ?Abdomen: Soft, nontender, nondistended, bowel sounds present ?Skin: No rash ? ? ?Resolved Hospital Problem List:   ?Aspiration pneumonia, Septic shock, Shock liver, AKI from ATN in setting of sepsis ?Candidemia s/p 14 days micafunging ended 4/9 ?HCAP ?Assessment & Plan:  ? ?Rheumatoid arthritis, Dermatomyositis ?[place] vs. 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 Lab markers increased (IL-2 receptor). ?Hematology/Oncology following, appreciate assistance ?Treatment plans now include etoposide, intrathecal chemo (ITC), cyclosporine ?Next episode was postponed till Monday, which will be continued for 5 weeks  ?On intrathecal methotrexate and steroid which will be continued for 4 weeks ?Oral cyclosporine started 4/11 ?Dexamethasone taper, continue to  administer prior to chemotherapy ?Continue Bactrim/voriconazole ppx in the setting of chemo/immunosuppression ? ?Febrile Neutropenia ?Appreciate ID recommendations ?Remained afebrile now ?ANC remain unmeasurable ?Completed 8 days of cefepime again ?Continue immunosuppression ppx as above ? ?Acute metabolic encephalopathy, uncertain etiology ?New onset seizure. ?Hx of anxiety and depression ?Concern for autoimmune process versus [place] with CNS involvement. Brain imaging/EEG unremarkable. ?We will stop fentanyl infusion today ?Continue methadone ?Continue Abilify, Lexapro, Klonopin ?Continue Keppra ? ?Pancytopenia ?Multifactorial in the setting of chemotherapy and [place]. ?Patient dropped her hemoglobin from lower GI bleeding yesterday ?She was transfused 1 unit PRBC ?Platelet counts are less than 20 today, will transfuse 2 units ?Monitor ANC ?Neutropenic precautions ?Granix per Onc ?Transfuse for Hgb < 7.0, Plt < 20 or hemodynamically significant bleeding (has received several units PRBCs/Plt) ? ?Lower GI bleeding in the setting of thrombocytopenia ?Monitor H&H and transfuse to keep it above 7 ? ?Acute hypoxic respiratory failure from HCAP ?S/p tracheostomy ?Patient is tolerating trach collar ?Closely monitor ? ?Steroid-induced hyperglycemia ?Continue long-acting and sliding scale insulin with CBG goal 140-180 ? ?Hypervolemia ?Hypernatremia, resolved ?Decrease free water flushes ?Monitor I&Os ?Holding diuresis ? ?Acute kidney injury due to aggressive diuresis ?Patient is third spacing and has anasarca ?She received diuretic therapy with rising serum creatinine ?Hold off further diuresis ?We will give albumin ? ?Pressure injury ?Stage 3 R hand - POA, stage 3 sacrum - POA, stage 2 Lt buttock, not present on admission. ?WOC, appreciate assistance ? ? ?Best Practice: (right click and "Reselect all SmartList Selections" daily)  ? ?Diet/type: tubefeeds ?DVT prophylaxis: SCDs ?GI prophylaxis: PPI ?Lines: see above ?Foley:  N/A  and removal ordered  ?Code Status:  full code ?Last date of multidisciplinary  goals of care discussion: S/p GOC discussion 4/10 with PMT/CCM/Onc, continue aggressive care at this time. ? ?Critical care time:   ? ?Total cri

## 2021-09-20 NOTE — Progress Notes (Signed)
Patient still passing clots from her rectum. MD is aware of these occurrences. See media for photo reference of size of clots. This has been a daily occurrence. ? ?Beryl Meager, RN ?

## 2021-09-20 NOTE — Progress Notes (Signed)
Placed patient on 35% trach collar. Sp02=97% with RR of 23-25 bpm, will continue to monitor patient.  ? ?

## 2021-09-20 NOTE — Progress Notes (Signed)
Placed patient back on full support due to increased respiratory rate. Will attempt wean in the morning.  ?

## 2021-09-20 NOTE — Progress Notes (Signed)
RT note. ?Patient placed back on PS.CPAP at this time due to ^ WOB, ^ RR. Patient on 10/5 40%, vt 563. RT will continue to monitor, RN aware. ?

## 2021-09-21 ENCOUNTER — Inpatient Hospital Stay (HOSPITAL_COMMUNITY): Payer: Federal, State, Local not specified - PPO

## 2021-09-21 DIAGNOSIS — B377 Candidal sepsis: Secondary | ICD-10-CM | POA: Diagnosis not present

## 2021-09-21 DIAGNOSIS — M069 Rheumatoid arthritis, unspecified: Secondary | ICD-10-CM | POA: Diagnosis not present

## 2021-09-21 DIAGNOSIS — G9340 Encephalopathy, unspecified: Secondary | ICD-10-CM | POA: Diagnosis not present

## 2021-09-21 DIAGNOSIS — L89304 Pressure ulcer of unspecified buttock, stage 4: Secondary | ICD-10-CM | POA: Diagnosis not present

## 2021-09-21 LAB — COMPREHENSIVE METABOLIC PANEL
ALT: 203 U/L — ABNORMAL HIGH (ref 0–44)
AST: 127 U/L — ABNORMAL HIGH (ref 15–41)
Albumin: <1.5 g/dL — ABNORMAL LOW (ref 3.5–5.0)
Alkaline Phosphatase: 423 U/L — ABNORMAL HIGH (ref 38–126)
Anion gap: 11 (ref 5–15)
BUN: 178 mg/dL — ABNORMAL HIGH (ref 8–23)
CO2: 16 mmol/L — ABNORMAL LOW (ref 22–32)
Calcium: 8.2 mg/dL — ABNORMAL LOW (ref 8.9–10.3)
Chloride: 107 mmol/L (ref 98–111)
Creatinine, Ser: 1.73 mg/dL — ABNORMAL HIGH (ref 0.44–1.00)
GFR, Estimated: 33 mL/min — ABNORMAL LOW (ref >60)
Glucose, Bld: 177 mg/dL — ABNORMAL HIGH (ref 70–99)
Potassium: 5.3 mmol/L — ABNORMAL HIGH (ref 3.5–5.1)
Sodium: 134 mmol/L — ABNORMAL LOW (ref 135–145)
Total Bilirubin: 1.3 mg/dL — ABNORMAL HIGH (ref 0.3–1.2)
Total Protein: 4.1 g/dL — ABNORMAL LOW (ref 6.5–8.1)

## 2021-09-21 LAB — BPAM PLATELET PHERESIS
Blood Product Expiration Date: 202304152359
Blood Product Expiration Date: 202304152359
Blood Product Expiration Date: 202304182359
ISSUE DATE / TIME: 202304151116
ISSUE DATE / TIME: 202304151116
ISSUE DATE / TIME: 202304151840
Unit Type and Rh: 6200
Unit Type and Rh: 6200
Unit Type and Rh: 9500

## 2021-09-21 LAB — CBC WITH DIFFERENTIAL/PLATELET
HCT: 14.4 % — ABNORMAL LOW (ref 36.0–46.0)
Hemoglobin: 4.8 g/dL — CL (ref 12.0–15.0)
MCH: 30.2 pg (ref 26.0–34.0)
MCHC: 33.3 g/dL (ref 30.0–36.0)
MCV: 90.6 fL (ref 80.0–100.0)
Platelets: 10 10*3/uL — CL (ref 150–400)
RBC: 1.59 MIL/uL — ABNORMAL LOW (ref 3.87–5.11)
RDW: 17 % — ABNORMAL HIGH (ref 11.5–15.5)
WBC: <0.1 10*3/uL — CL (ref 4.0–10.5)
nRBC: 0 % (ref 0.0–0.2)

## 2021-09-21 LAB — POCT I-STAT 7, (LYTES, BLD GAS, ICA,H+H)
Acid-base deficit: 12 mmol/L — ABNORMAL HIGH (ref 0.0–2.0)
Bicarbonate: 14.5 mmol/L — ABNORMAL LOW (ref 20.0–28.0)
Calcium, Ion: 1.2 mmol/L (ref 1.15–1.40)
HCT: 57 % — ABNORMAL HIGH (ref 36.0–46.0)
Hemoglobin: 19.4 g/dL — ABNORMAL HIGH (ref 12.0–15.0)
O2 Saturation: 93 %
Patient temperature: 93.7
Potassium: 4.4 mmol/L (ref 3.5–5.1)
Sodium: 135 mmol/L (ref 135–145)
TCO2: 16 mmol/L — ABNORMAL LOW (ref 22–32)
pCO2 arterial: 32.4 mmHg (ref 32–48)
pH, Arterial: 7.244 — ABNORMAL LOW (ref 7.35–7.45)
pO2, Arterial: 68 mmHg — ABNORMAL LOW (ref 83–108)

## 2021-09-21 LAB — PREPARE PLATELET PHERESIS
Unit division: 0
Unit division: 0
Unit division: 0

## 2021-09-21 LAB — PREPARE RBC (CROSSMATCH)

## 2021-09-21 LAB — DIC (DISSEMINATED INTRAVASCULAR COAGULATION)PANEL
D-Dimer, Quant: 3.16 ug/mL-FEU — ABNORMAL HIGH (ref 0.00–0.50)
Fibrinogen: 724 mg/dL — ABNORMAL HIGH (ref 210–475)
INR: 1.6 — ABNORMAL HIGH (ref 0.8–1.2)
Platelets: 11 10*3/uL — CL (ref 150–400)
Prothrombin Time: 18.9 seconds — ABNORMAL HIGH (ref 11.4–15.2)
Smear Review: NONE SEEN
aPTT: 35 seconds (ref 24–36)

## 2021-09-21 LAB — GLUCOSE, CAPILLARY
Glucose-Capillary: 101 mg/dL — ABNORMAL HIGH (ref 70–99)
Glucose-Capillary: 127 mg/dL — ABNORMAL HIGH (ref 70–99)
Glucose-Capillary: 133 mg/dL — ABNORMAL HIGH (ref 70–99)
Glucose-Capillary: 14 mg/dL — CL (ref 70–99)
Glucose-Capillary: 154 mg/dL — ABNORMAL HIGH (ref 70–99)
Glucose-Capillary: 198 mg/dL — ABNORMAL HIGH (ref 70–99)
Glucose-Capillary: 40 mg/dL — CL (ref 70–99)
Glucose-Capillary: 65 mg/dL — ABNORMAL LOW (ref 70–99)
Glucose-Capillary: 95 mg/dL (ref 70–99)

## 2021-09-21 LAB — CSF CULTURE W GRAM STAIN: Culture: NO GROWTH

## 2021-09-21 IMAGING — DX DG CHEST 1V PORT
1 series · 1 of 1 positions shown · non-contrast
Comparison: [DATE].

CLINICAL DATA: Tachypnea.

EXAM:
PORTABLE CHEST 1 VIEW

[chest]
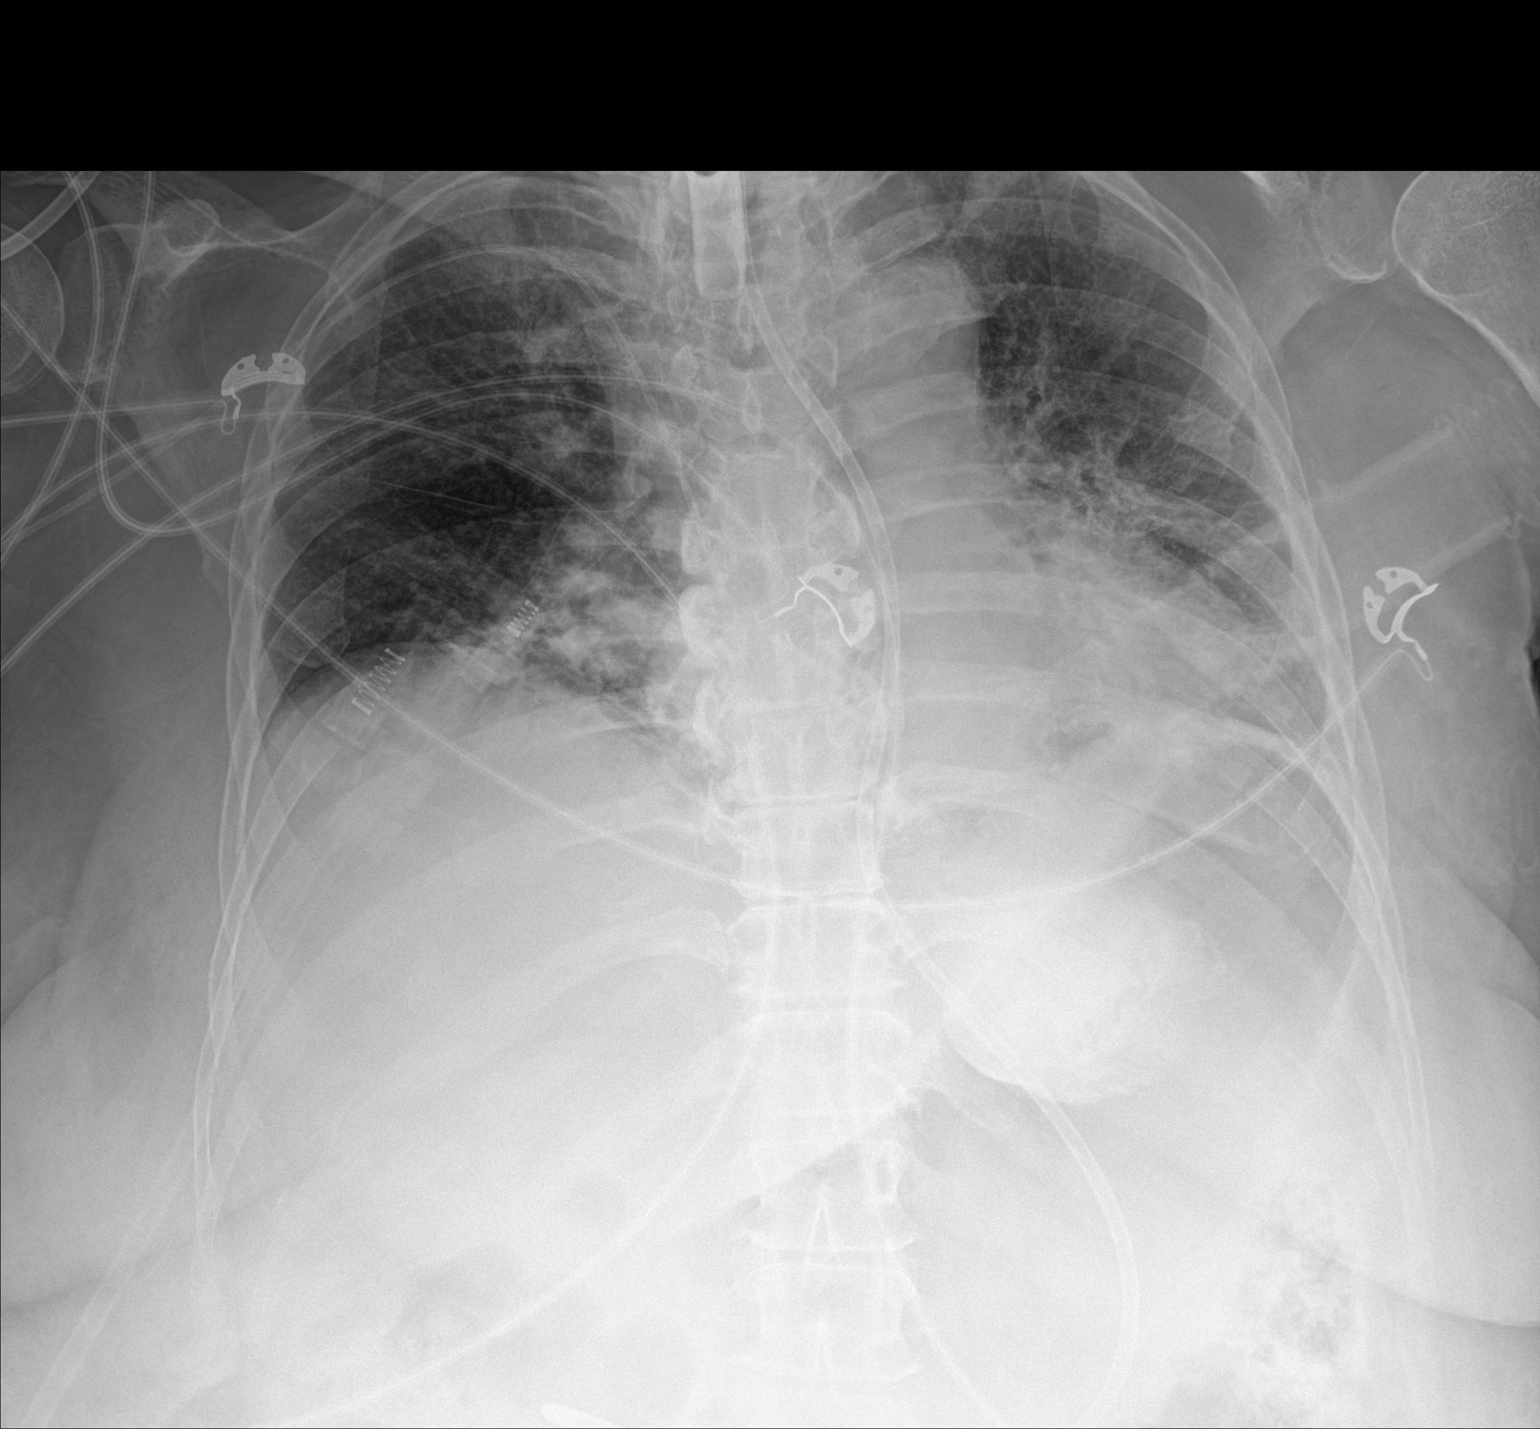

[1 of 1 positions shown; findings below may reference images not displayed]

FINDINGS: The heart size and mediastinal contours are stable. A tracheostomy
tube terminates 3.4 cm above the carina. There is atherosclerotic
calcification of the aorta. Lung volumes are low with patchy
airspace disease at the lung bases bilaterally, not significantly
changed. No effusion or pneumothorax. An enteric tube courses over
the left upper quadrant and out of the field of view. No acute
osseous abnormality.
IMPRESSION: Low lung volumes with patchy airspace disease at the lung bases, not
significantly changed from the prior exam.

## 2021-09-21 IMAGING — DX DG CHEST 1V PORT
1 series · 1 of 1 positions shown · non-contrast
Comparison: [DATE] [DATE], [DATE] ([DATE] a.m.)

CLINICAL DATA: Hypoxia.

EXAM:
PORTABLE CHEST 1 VIEW

[chest ap]
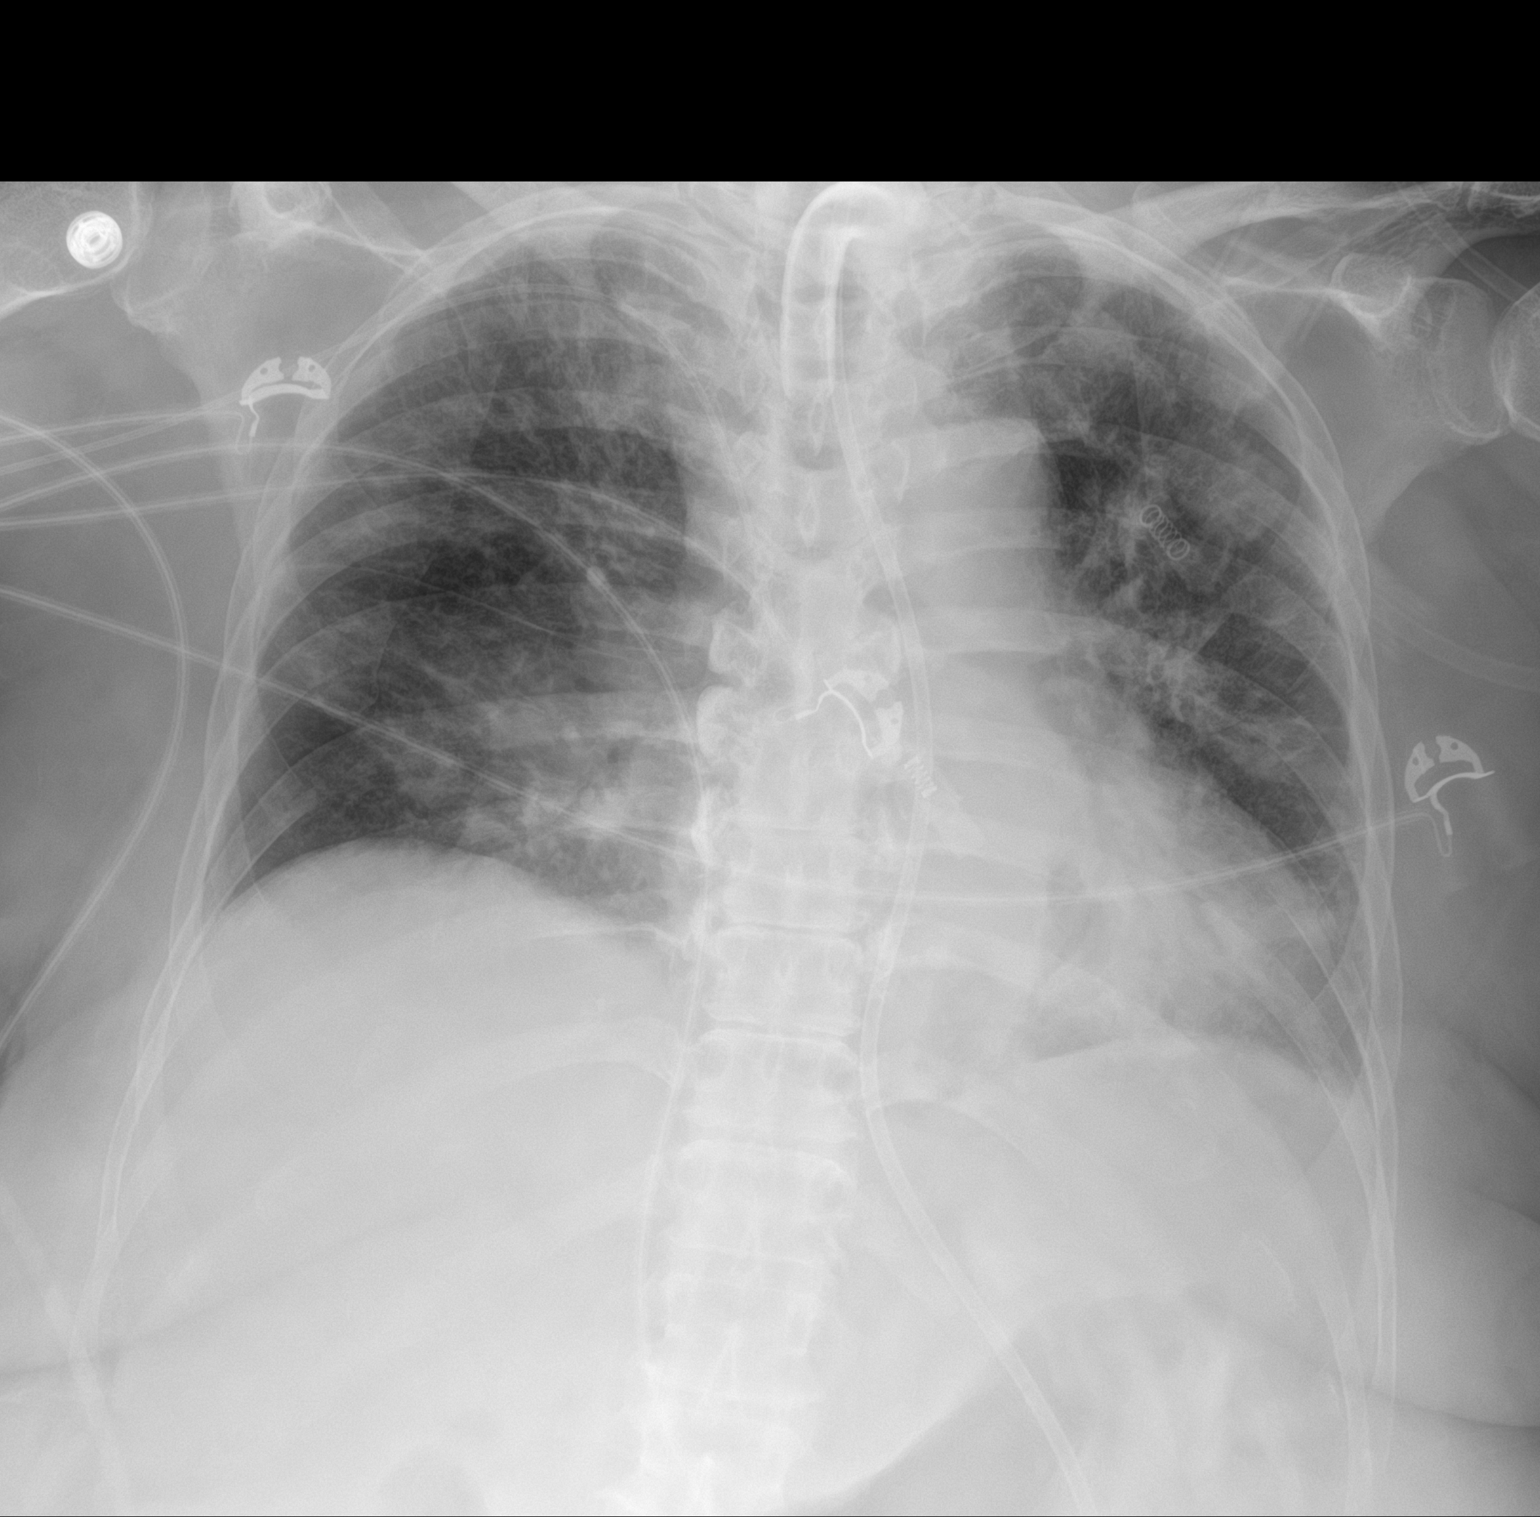

[1 of 1 positions shown; findings below may reference images not displayed]

FINDINGS: There is stable tracheostomy tube and nasogastric tube positioning.
Persistent areas of patchy airspace disease are seen within the
bilateral lung bases, mid left lung and bilateral apices. There is
no evidence of a pleural effusion or pneumothorax. The heart size
and mediastinal contours are within normal limits. Multilevel
degenerative changes are seen throughout the thoracic spine.
IMPRESSION: Persistent bilateral patchy airspace disease.

## 2021-09-21 MED ORDER — DEXTROSE 50 % IV SOLN
INTRAVENOUS | Status: AC
  Administered 2021-09-21: 50 mL via INTRAVENOUS
  Filled 2021-09-21: qty 50

## 2021-09-21 MED ORDER — INSULIN GLARGINE-YFGN 100 UNIT/ML ~~LOC~~ SOLN
10.0000 [IU] | Freq: Every day | SUBCUTANEOUS | Status: DC
  Filled 2021-09-21: qty 0.1

## 2021-09-21 MED ORDER — SODIUM CHLORIDE 0.9 % IV SOLN
10.0000 mg | Freq: Every day | INTRAVENOUS | Status: DC
  Filled 2021-09-21: qty 1

## 2021-09-21 MED ORDER — SODIUM CHLORIDE 0.9% IV SOLUTION: Freq: Once | INTRAVENOUS | Status: AC

## 2021-09-21 MED ORDER — IPRATROPIUM-ALBUTEROL 0.5-2.5 (3) MG/3ML IN SOLN
3.0000 mL | Freq: Once | RESPIRATORY_TRACT | Status: AC
  Administered 2021-09-21: 3 mL via RESPIRATORY_TRACT
  Filled 2021-09-21: qty 3

## 2021-09-21 MED ORDER — SODIUM CHLORIDE 0.9 % IV SOLN: 250.0000 mL | INTRAVENOUS | Status: DC

## 2021-09-21 MED ORDER — STERILE WATER FOR INJECTION IV SOLN
INTRAVENOUS | Status: DC
  Filled 2021-09-21: qty 1000

## 2021-09-21 MED ORDER — DEXTROSE 10 % IV SOLN
INTRAVENOUS | Status: DC
Start: 2021-09-21 — End: 2021-09-22

## 2021-09-21 MED ORDER — SODIUM BICARBONATE 8.4 % IV SOLN
50.0000 meq | Freq: Once | INTRAVENOUS | Status: AC
Start: 2021-09-22 — End: 2021-09-21
  Administered 2021-09-21: 50 meq via INTRAVENOUS
  Filled 2021-09-21: qty 50

## 2021-09-21 MED ORDER — NOREPINEPHRINE 4 MG/250ML-% IV SOLN: 2.0000 ug/min | INTRAVENOUS | Status: DC

## 2021-09-21 MED ORDER — FENTANYL 2500MCG IN NS 250ML (10MCG/ML) PREMIX INFUSION
0.0000 ug/h | INTRAVENOUS | Status: DC
  Administered 2021-09-21: 50 ug/h via INTRAVENOUS
  Administered 2021-09-21: 150 ug/h via INTRAVENOUS
  Filled 2021-09-21: qty 250

## 2021-09-21 MED ORDER — DEXTROSE 50 % IV SOLN: 1.0000 | INTRAVENOUS | Status: AC

## 2021-09-21 MED ORDER — DEXTROSE 50 % IV SOLN
1.0000 | INTRAVENOUS | Status: AC
Start: 2021-09-21 — End: 2021-09-21

## 2021-09-21 MED ORDER — DEXTROSE 50 % IV SOLN
INTRAVENOUS | Status: AC
  Administered 2021-09-21: 25 mL
  Filled 2021-09-21: qty 50

## 2021-09-21 MED ORDER — SODIUM CHLORIDE 0.9% IV SOLUTION
Freq: Once | INTRAVENOUS | Status: DC
Start: 2021-09-21 — End: 2021-09-22

## 2021-09-21 NOTE — Progress Notes (Signed)
Date and time results received: 09/21/21 4:22 AM ? ? ?Test: HGB ?Critical Value: 4.8 ? ?Name of Provider Notified: Elink/Mohan ? ?Orders Received? Or Actions Taken?: Actions Taken: 2 units RBC, 2 units Platelets, and 1 unit plasma to be transfused.  CBC post transfusion ?

## 2021-09-21 NOTE — Progress Notes (Signed)
Pt RR sustaining in 30's. HR now 120-130's. SBP 80's MAP above 65. MD contacted. Labs for DIC, CBC, and CMP ordered. Possible transfusion needed due to constant blood clots from rectum. Awaiting lab results. ? ?Jannifer Hick, RN ?

## 2021-09-21 NOTE — Progress Notes (Signed)
? ?NAME:  Carrie [name], MRN:  [id number], DOB:  [birth year], LOS: 43 ?ADMISSION DATE:  08/23/2021, CONSULTATION DATE:  08/22/2021 ?REFERRING MD:  Darrick Meigs, CHIEF COMPLAINT:  Confusion  ? ?History of Present Illness:  ?61 year old woman with history of dermatomyositis and rheumatoid arthritis (on immunosuppressive therapy) who presented 3/12 with AMS, abdominal pain, constipation and volume depletion.  Found to have pancytopenia; seen by Hematology and initially felt to be related to mycophenolate/steroid therapy.  BMBx showed hypercellular marrow w/o blasts or malignant cells.  She developed worsening encephalopathy on 3/16 and Neuro was consulted.  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 ? ?On 3/17, patient had new onset seizure and required intubation for airway protection. PCCM consulted. She was transferred from [facility] to [facility] for LTM. Later felt to have [place] and started on dexamethasone and etoposide. Course complicated by Candidal fungemia likely from PICC line. ? ?Pertinent Medical History:  ?Rheumatoid arthritis, Dermatomyositis, Fatty liver, HLD, HTN, Depression, Anxiety ? 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 ?3/26 Candida fungemia for which started on micafungin. Dex and Etoposide stopped. [place] discussion with family - palliative care consulted  ?3/28 Febrile overnight for which cefepime added. WBC dropped to 1.4; started on Granix inj. ?3/31 restart dexamethasone ?4/01 Bronch/BAL ?4/03 restart etoposide ?4/04 change to pressure control to improve vent  synchrony ?4/05 Better tolerating pressure control. Increased peripheral edema. ?4/06 Continues to tolerate pressure control. ANC dropping, 100 today. Placed on Neutropenic Precautions. Ongoing peripheral edema. Nearing tracheostomy discussion. ?4/07 Tolerating pressure control.  Slightly more awake with weaned sedation, biting ETT with suctioning, withdrawing all 4 extremities to pain.  Per Onc, re-dose etoposide today.  Ongoing discussion re: extubation trial versus tracheostomy. ?4/10 Hgb drift with blood/clot passage from rectum. Received 2U PRBCs, 1U Plt ?4/11 Plt transfusion,tracheostomy, PICC line, etoposide ? ?Studies:  ?03/2021 labs >> ANA positive, Jo-1 neg, centromere neg, ds DNA neg, RNP positive, SSA/SSB neg, RF positive, chromatin Ab neg, CCP neg ?MRI 3/13 >> no acute findings ?3/17 bone marrow biopsy >> hypercellular bone marrow with trilineage hematopoiesis, comment: non-specific, follow up cytogenetic studies ?3/12 CT Abdomen and pelvis >> no acute findings, adrenal nodule, diverticulosis ?3/12 CT Head without contrast >> no acute findings ?3/16 EEG >> moderate diffuse encephalopathy of nonspecific etiology, no seizures or epileptiform discharges noted during the recording ?3/19 Echo >> EF 60 to 65%, mild LVH, grade 1 DD, mild/mod TR ?3/29 CT Abd >> b/l ASD, small GB stone, diffuse subcutaneous edema ?3/31 CT Head >> mild cortical atrophy ?4/06 CT Head >> NAICA ?4/09 MRI >> No acute or focal finding.  Generalized brain atrophy. Bilateral mastoid effusions. ? ? ?Micro:  ?3/16 ParvoB19 IgG pos, IgM neg ?3/16 EBV IgG pos, IgM neg ?3/16 CMV IgG pos, IgM neg ?3/16 HHV6 IgG pos ?3/17 Bone marrow biopsy > hypercellular with trilineage hematopoiesis ?3/23 Blood culture 1/4 > Candida glabrata ?4/01 BAL > (WBC/PMN and mononuclear), gram variable rods, gram positive cocci, > few normal resp flora, no staph/PsA ?4/01 PCP > ?4/01 BAL AFB > Negative ?4/01 BAL Fungus > NGTD ? ?Antibiotics:  ?Rocephin  3/12 ?Acyclovir 3/16>3/19  ?Unasyn  3/19 > 3/24 ?Micafungin 3/24 >  ?Cefepime 3/28 > 4/4, resumed 4/7.  ? ? ?Interim History / Subjective:  ?Remained afebrile ?Failed trach collar trial today due to tachypnea and hypoxia ?Required multiple PRBC and platelet transfusion ?Continue to have lower GI bleeding and frequent loose stools ? ?Objective:  ?Blood pressure 96/62, pulse (!) 102, temperature (!) 94 ?F (34.4 ?C), temperature source Axillary, resp. rate (!) 31, height _0  (1.6 m), weight 81.3 kg, SpO2 93 %. ?   ?Vent Mode: PRVC ?FiO2 (%):  [35 %-40 %] 40 % ?Set Rate:  [18 bmp] 18 bmp ?Vt Set:  [320 mL] 320 mL ?PEEP:  [5 cmH20] 5 cmH20 ?Pressure Support:  [10 cmH20] 10 cmH20 ?Plateau Pressure:  [20 cmH20] 20 cmH20  ? ?Intake/Output Summary (Last 24 hours) at 09/21/2021 1404 ?Last data filed at 09/21/2021 1350 ?Gross per 24 hour  ?Intake 5394.14 ml  ?Output 978 ml  ?Net 4416.14 ml  ? ?Filed Weights  ? 09/17/21 0456 09/18/21 0500 09/19/21 0600  ?Weight: 74.9 kg 77.8 kg 81.3 kg  ? ?Physical Examination: ?General: Crtitically ill-appearing female, s/p trach on full vent support ?HEENT: [place]/AT, eyes anicteric.   ?Neuro: Encephalopathic, eyes open, not following commands ?Chest: Tachypneic, coarse breath sounds, no wheezes or rhonchi ?Heart: Tachycardic, regular rhythm, no murmurs or gallops ?Abdomen: Soft, nontender, nondistended, bowel sounds present ?Skin: Has stage II sacral wound ? ? ?Resolved Hospital Problem List:   ?Aspiration pneumonia, Septic shock, Shock liver, AKI from ATN in setting of sepsis ?Candidemia s/p 14 days micafunging ended 4/9 ?HCAP ?Assessment & Plan:  ? ?Rheumatoid arthritis, Dermatomyositis ?[place] vs. 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 Lab markers increased (IL-2 receptor). ?Hematology/Oncology following, appreciate assistance ?Treatment plans now include etoposide, intrathecal chemo (ITC), cyclosporine ?Next episode was postponed  till Monday, which will be continued for 5 weeks  ?On intrathecal methotrexate and steroid which will be continued for 4 weeks ?Oral cyclosporine started 4/11 ?Dexamethasone taper, continue to administer prior to chemotherapy ?Continue Bactrim/voriconazole ppx in the setting of chemo/immunosuppression ?Despite every treatment, so far no improvement in mental status and clinical condition, patient continued to decline ? ?Febrile Neutropenia ?Appreciate ID recommendations ?Remained afebrile now ?ANC remain unmeasurable ?Completed 8 days of cefepime again ?Continue immunosuppression ppx as above ? ?Acute metabolic encephalopathy, uncertain etiology ?New onset seizure. ?Hx of anxiety and depression ?Concern for autoimmune process versus [place] with CNS involvement. Brain imaging/EEG unremarkable. ?Sedation was stopped but patient did not tolerate that with tachypnea and tachycardia ?Continue methadone ?Continue Abilify, Lexapro, Klonopin ?Continue Keppra ? ?Pancytopenia ?Multifactorial in the setting of chemotherapy and [place]. ?Patient dropped her hemoglobin from lower GI bleeding yesterday ?She was transfused 4 units of PRBCs, 3 units of platelets and 1 FFP in last 24 hours, despite that her platelet count remained at 11 and H&H is less than 7 ?Monitor ANC ?Neutropenic precautions ?Granix daily per Onc ? ?Lower GI bleeding in the setting of thrombocytopenia ?Monitor H&H and transfuse to keep it above 7 ? ?Acute hypoxic respiratory failure from HCAP ?S/p tracheostomy ?Patient is failing trach collar due to tachypnea and tachycardia ?She is back on full vent support ?Closely monitor ? ?Steroid-induced hyperglycemia ?Continue long-acting and sliding scale insulin with CBG goal 140-180 ? ?Hypervolemia ?Hypernatremia, resolved ?Decrease free water flushes ?Monitor I&Os ?Holding diuresis ? ?Acute kidney injury due to ischemic ATN ?Patient is third spacing and has anasarca ?She is intravascularly volume depleted ?Has been given  albumin several  times with no improvement, now serum creatinine continue to trend up ?Holding further diuretic therapy ? ?Pressure injury ?Stage 3 R hand - POA, stage 3 sacrum - POA, stage 2 Lt buttock, not present on a

## 2021-09-21 NOTE — Progress Notes (Signed)
eLink Physician-Brief Progress Note ?Patient Name: Carrie [name] ?DOB: 21-Aug-1960 ?MRN: [id number] ? ? ?Date of Service ? 09/21/2021  ?HPI/Events of Note ? CXR reveals persistent bilateral patchy airspace disease. ?   ?eICU Interventions ? Plan: ?Increase PEEP to 8.  ? ? ? ?Intervention Category ?Major Interventions: Respiratory failure - evaluation and management;Hypoxemia - evaluation and management ? ?Malala Trenkamp Cornelia Copa ?09/21/2021, 11:28 PM ?

## 2021-09-21 NOTE — Progress Notes (Signed)
Notified RT that patient desatting to 87% sustained, no improvement with suctioning. Patient breathing labored with accessory muscle use. RT requested RN to increase FiO2 to 60%. Patient now satting at 90% on 60%. Will continue to monitor.  ?

## 2021-09-21 NOTE — Progress Notes (Signed)
RT note. ?Attempt patient on SBT this morning 10/5 40%. Patients RR in the 40's at this time. Patient placed back on full support/ previous settings. RT will continue to monitor.  ?

## 2021-09-21 NOTE — Progress Notes (Signed)
eLink Physician-Brief Progress Note ?Patient Name: Carrie CoxLivia [name]  ?DOB: 08/17/1960 ?MRN: [id number] ? ? ?Date of Service ? 09/21/2021  ?HPI/Events of Note ? Hypoglycemia - Blood glucose = 14. Already give 1 amp D50.  ?eICU Interventions ? Plan: ?D10W IV infusion at 50 mL/hour. ?D/C Novolog 6 units Q 4 hours tube feed coverage. ?Decrease SemGlee insulin from 20 units to 10 units  BID.  ? ? ? ?Intervention Category ?Major Interventions: Other: ? ?Kersti Scavone Dennard NipEugene ?09/21/2021, 8:15 PM ?

## 2021-09-21 NOTE — Progress Notes (Addendum)
eLink Physician-Brief Progress Note ?Patient Name: Pershing CoxLivia S  ?DOB: 03/23/1961 ?MRN: [id number] ? ? ?Date of Service ? 09/21/2021  ?HPI/Events of Note ? Hypoglycemia = Blood glucose = 70. Already given D50 1 amp.   ?eICU Interventions ? Plan: ?Increase D10W IV infusion to 75 mL/hour.   ? ? ? ?Intervention Category ?Major Interventions: Other: ? ?Rhylan Kagel Dennard NipEugene ?09/21/2021, 11:18 PM ?

## 2021-09-21 NOTE — Plan of Care (Signed)
Interdisciplinary Goals of Care Family Meeting ? ? ?Date carried out:: 09/21/2021 ? ?Location of the meeting: Conference room ? ?Member's involved: Physician, Bedside Registered Nurse, and Family Member or next of kin ? ?Durable Power of Attorney or acting medical decision maker: Basilio [place]   ? ?Discussion: We discussed goals of care for Carrie [name] .   ? ?The Clinical status was relayed to all family members in conference room and patient's son over the phone in detail. ?  ?Updated and notified of patients medical condition. ?  ?  ?Patient remains unresponsive and will not open eyes to command.   ?Patient is having a weak cough and struggling to remove secretions.   ?Patient with increased WOB and using accessory muscles to breathe ?Explained to family course of therapy and the modalities  ?Signs of brain damage, respiratory failure ?Evidence of kidney failure ?Recommend follow up [place] ?  ?Patient with Progressive multiorgan failure with a very high probablity of a very minimal chance of meaningful recovery despite all aggressive and optimal medical therapy.  Patient's family decided to keep her comfortable while continuing medications, if she fails we will proceed with full comfort measures by tomorrow ? ?Code status: Full DNR ? ?Disposition: Continue current acute care, with some limitations of aggressive care including no pressors ? ?  ?Family are satisfied with Plan of action and management. All questions answered ?  ?Jacky Kindle MD ?[facility] ?See Amion for pager ?If no response to pager, please call [phone] until 7pm ?After 7pm, Please call E-link [phone] ? ?

## 2021-09-21 NOTE — Progress Notes (Signed)
eLink Physician-Brief Progress Note ?Patient Name: Carrie [name] ?DOB: 09/29/1960 ?MRN: [id number] ? ? ?Date of Service ? 09/21/2021  ?HPI/Events of Note ? Hypoxia - Nursing concerned about "guppy" breathing and increased O2 requirement. FiO2 now increased to 80%. Sat = 89-90%. Nursing also reports low urine output..   ?eICU Interventions ? Plan: ?ABG STAT. ?Portable CXR STAT.  ? ? ? ?Intervention Category ?Major Interventions: Respiratory failure - evaluation and management ? ?Carrie [name] ?09/21/2021, 10:11 PM ?

## 2021-09-21 NOTE — Progress Notes (Signed)
MD Chand notified of pt's BP, lab results, blood transfusion orders, CT orders clarified. Family contacted by Dr Merrily Pew. ?

## 2021-09-21 NOTE — Progress Notes (Addendum)
eLink Physician-Brief Progress Note ?Patient Name: Carrie [name]  ?DOB: 06/01/1961 ?MRN: [id number] ? ? ?Date of Service ? 09/21/2021  ?HPI/Events of Note ? Camera: ?RR > 25. Air bilaterally +. Trach. ?No secretions as per RN discussion. Has rhonchi. PIP < 28. ?No fever. Was on fenta 200 yesterday. Now on prn pushes. RR gets better on it. ?  ?eICU Interventions ? Get CxR, ABG ?Duoneb once.   ? ? ? ?Intervention Category ?Intermediate Interventions: Respiratory distress - evaluation and management ? ?Ranee GosselinKinila T Delayna Sparlin ?09/21/2021, 12:47 AM ? ?2:48 ?CxR no new findings. Trach in place. ?ABG not on epic. Discussed with RN, RT said it is stable. ?Hr 110.  ? ? ?Tachypnea, tachycardia. Legs edema. ?Cr 1.3, low platelet count of 14 K yesterday AM. K was 5.7, got lokelma on 15ht AM. ?UOP 300 in this shift. ?Not on pressors. ?Hg stable > 7.5, was < 6 on 8 th.  ?Admitted ICU-since 8th of April. in hospital  since march. ?4/10 Hgb drift with blood/clot passage from rectum. Received 2U PRBCs, 1U Plt ?4/11 Plt transfusion,tracheostomy, PICC line, etoposide ?Pancytopenia ? ?Plan: ?- get DIC panel ?- CMP, CBC stat. Prn CT head if not better or platelets going down further.  ?-getting fenta pushes often. Still has tachypnea ?- duoneb stat, not given yet. ? ?4 AM ?Fentanyl 50 mcg gtt making MAP soft. ?Still sinus tachy, ABG : no acidosis.  ?Platelets at 11 K down from 14 ?Camera: ?Neuro wise. Withdraws to pain. Breathing over the vent. Pupils- reactive, 3 mm. ?MAP 73 now. ?- start low dose neo synephrine for CT head stat. ? ?4:27 ?Labs reviewed. Blood clots per rectum. ?Hg < 5. Pancytopenia. ? ?- FFP/Platelets/PRBC. ?- stabilize then go to CT head. ?Discussed with RN. ? ? ? ? ?

## 2021-09-21 NOTE — Progress Notes (Signed)
eLink Physician-Brief Progress Note ?Patient Name: Carrie CoxLivia S  ?DOB: 04/02/1961 ?MRN: [id number] ? ? ?Date of Service ? 09/21/2021  ?HPI/Events of Note ? ABG = 7.24/32/68/14.5  ?eICU Interventions ? Plan: ?NaHCO3 50 meq IV now. ?NaHCO3 IV infusion at 50 mL/hour.   ? ? ? ?Intervention Category ?Major Interventions: Acid-Base disturbance - evaluation and management;Respiratory failure - evaluation and management ? ?Antia Rahal Dennard NipEugene ?09/21/2021, 11:42 PM ?

## 2021-09-21 NOTE — Progress Notes (Signed)
Hypoglycemic Event ? ?CBG: 65 ? ?Treatment: D50 25 mL (12.5 gm) ? ?Symptoms: None ? ?Follow-up CBG: Time:1606 CBG Result:133 ? ?Possible Reasons for Event: Other:   ? ?Comments/MD notified:Dr. Merrily Pew notified. No new orders at this time.  ? ? ? ?Carrie [name] ? ? ?

## 2021-09-21 NOTE — Progress Notes (Signed)
Fentanyl gtt titrated up per order for tachypneia and dyspnea. ?

## 2021-09-22 LAB — BPAM RBC
Blood Product Expiration Date: 202304302359
Blood Product Expiration Date: 202305012359
Blood Product Expiration Date: 202305012359
Blood Product Expiration Date: 202305072359
Blood Product Expiration Date: 202305072359
Blood Product Expiration Date: 202305072359
Blood Product Expiration Date: 202305082359
ISSUE DATE / TIME: 202304141713
ISSUE DATE / TIME: 202304160447
ISSUE DATE / TIME: 202304160447
ISSUE DATE / TIME: 202304160925
ISSUE DATE / TIME: 202304161045
ISSUE DATE / TIME: 202304161231
ISSUE DATE / TIME: 202304162116
Unit Type and Rh: 600
Unit Type and Rh: 600
Unit Type and Rh: 600
Unit Type and Rh: 600
Unit Type and Rh: 600
Unit Type and Rh: 600
Unit Type and Rh: 600

## 2021-09-22 LAB — TYPE AND SCREEN
ABO/RH(D): A NEG
Antibody Screen: NEGATIVE
Unit division: 0
Unit division: 0
Unit division: 0
Unit division: 0
Unit division: 0
Unit division: 0
Unit division: 0

## 2021-09-22 LAB — PREPARE FRESH FROZEN PLASMA: Unit division: 0

## 2021-09-22 LAB — CYCLOSPORINE: Cyclosporine, LabCorp: 36 ng/mL — ABNORMAL LOW (ref 100–400)

## 2021-09-22 LAB — FUNGUS CULTURE WITH STAIN

## 2021-09-22 LAB — PREPARE PLATELET PHERESIS
Unit division: 0
Unit division: 0
Unit division: 0
Unit division: 0

## 2021-09-22 LAB — BPAM PLATELET PHERESIS
Blood Product Expiration Date: 202304182359
Blood Product Expiration Date: 202304192359
Blood Product Expiration Date: 202304192359
Blood Product Expiration Date: 202304192359
ISSUE DATE / TIME: 202304160445
ISSUE DATE / TIME: 202304160927
ISSUE DATE / TIME: 202304161045
Unit Type and Rh: 600
Unit Type and Rh: 6200
Unit Type and Rh: 6200
Unit Type and Rh: 6200

## 2021-09-22 LAB — BPAM FFP
Blood Product Expiration Date: 202304202359
ISSUE DATE / TIME: 202304160445
Unit Type and Rh: 6200

## 2021-09-22 LAB — FUNGUS CULTURE RESULT

## 2021-09-22 LAB — FUNGAL ORGANISM REFLEX

## 2021-09-22 MED ORDER — FENTANYL BOLUS VIA INFUSION
100.0000 ug | INTRAVENOUS | Status: DC | PRN
  Filled 2021-09-22: qty 100

## 2021-09-22 MED ORDER — FENTANYL CITRATE PF 50 MCG/ML IJ SOSY: 50.0000 ug | PREFILLED_SYRINGE | INTRAMUSCULAR | Status: DC | PRN

## 2021-09-22 MED ORDER — DEXTROSE 5 % IV SOLN: INTRAVENOUS | Status: DC

## 2021-09-22 MED ORDER — ACETAMINOPHEN 650 MG RE SUPP: 650.0000 mg | Freq: Four times a day (QID) | RECTAL | Status: DC | PRN

## 2021-09-22 MED ORDER — GLYCOPYRROLATE 1 MG PO TABS: 1.0000 mg | ORAL_TABLET | ORAL | Status: DC | PRN

## 2021-09-22 MED ORDER — GLYCOPYRROLATE 0.2 MG/ML IJ SOLN: 0.2000 mg | INTRAMUSCULAR | Status: DC | PRN

## 2021-09-22 MED ORDER — ACETAMINOPHEN 325 MG PO TABS
650.0000 mg | ORAL_TABLET | Freq: Four times a day (QID) | ORAL | Status: DC | PRN
Start: 2021-09-22 — End: 2021-09-22

## 2021-09-22 MED ORDER — POLYVINYL ALCOHOL 1.4 % OP SOLN
1.0000 [drp] | Freq: Four times a day (QID) | OPHTHALMIC | Status: DC | PRN
  Filled 2021-09-22: qty 15

## 2021-09-22 MED ORDER — FENTANYL 2500MCG IN NS 250ML (10MCG/ML) PREMIX INFUSION: 0.0000 ug/h | INTRAVENOUS | Status: DC

## 2021-09-22 MED ORDER — DIPHENHYDRAMINE HCL 50 MG/ML IJ SOLN: 25.0000 mg | INTRAMUSCULAR | Status: DC | PRN

## 2021-09-23 LAB — FUNGUS CULTURE RESULT

## 2021-09-23 LAB — FUNGUS CULTURE WITH STAIN

## 2021-09-23 LAB — FUNGAL ORGANISM REFLEX

## 2021-09-27 LAB — CULTURE, FUNGUS WITHOUT SMEAR

## 2021-10-06 NOTE — Progress Notes (Signed)
eLink Physician-Brief Progress Note ?Patient Name: Carrie [name] ?DOB: 1960-10-11 ?MRN: [id number] ? ? ?Date of Service ? 10/16/21  ?HPI/Events of Note ? Spoke with Carrie [name] (niece) who is Power-of-Attorney (POA) for the patient. Carrie [name] states that given the patient's deterioration in spite of aggressive therapy, she and the family request that the patient be removed from life sustaining therapy and mechanical ventilation and be allowed to pass with comfort and dignity.   ?eICU Interventions ? Will move to comfort care via the PCCM Withdrawal of Life Sustaining Care order set.   ? ? ? ?Intervention Category ?Major Interventions: End of life / care limitation discussion ? ?Carrie [name] Cornelia Copa ?Oct 16, 2021, 3:30 AM ?

## 2021-10-06 NOTE — Progress Notes (Signed)
Pt desatting, very hypotensive with an irregular breathing pattern on the vent Dr. Arsenio LoaderSommer notified as well as the patient's power of attorney- Myriam JacobsonHelen to update her on the patient's condition as patient may be nearing the end of life. ?

## 2021-10-06 NOTE — Death Summary Note (Signed)
?DEATH SUMMARY  ? ?Patient Details  ?Name: Carrie [name] ?MRN: [id number] ?DOB: 1960/10/21 ? ?Admission/Discharge Information  ? ?Admit Date:  September 08, 2021  ?Date of Death: Date of Death: 2021-10-14  ?Time of Death: Time of Death: 88  ?Length of Stay: 35  ?Referring Physician: Carollee Herter, Alferd Apa, DO  ? 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 ?Hx of anxiety and depression ?Pancytopenia ?Lower GI bleeding in the setting of thrombocytopenia ?Acute hypoxic respiratory failure from HCAP ?Steroid-induced hyperglycemia ?Hypervolemia ?Hypernatremia ?Acute kidney injury due to ischemic ATN ?Aspiration pneumonia, Septic shock ?Shock liver ?Candidemia ? ?Diagnoses  ?Preliminary cause of death:   Withdrawal of care and comfort care due to multisystem organ failure ?Secondary Diagnoses (including complications and co-morbidities):  ?Principal Problem: ?  Candidemia ([place]) ?Active Problems: ?  Hyperlipidemia ?  Hypertension ?  Generalized weakness ?  Rheumatoid arthritis flare ([facility]) ?  Depression with anxiety ?  Lactic acidosis ?  Pancytopenia ([place]) ?  Volume depletion ?  Long-term corticosteroid use ?  Open wound of right hand ?  Adrenal nodule ([place]) ?  Pressure injury of skin ?  High anion gap metabolic acidosis ?  Altered mental status ?  Metabolic encephalopathy ?  Encephalitis ?  Acute encephalopathy ?  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  Goals of care, counseling/discussion ? ? ?Brief Hospital Course (including significant findings, care, treatment, and services provided and events leading to death)  ?Carrie [name] is a 61 y.o. year old female who 61 year old woman with history of dermatomyositis and rheumatoid arthritis (on immunosuppressive therapy) who presented 2022/09/09 with AMS, abdominal pain, constipation and volume depletion.  Found to have  pancytopenia; seen by Hematology and initially felt to be related to mycophenolate/steroid therapy.  BMBx showed hypercellular marrow w/o blasts or malignant cells.  She developed worsening encephalopathy on 3/16 and Neuro was consulted.  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 ?  ?On 3/17, patient had new onset seizure and required intubation for airway protection. PCCM consulted. [facility] was [facility] to [facility] for LTM. Later felt to have [place] and started on dexamethasone and etoposide. Course complicated by Candidal fungemia likely from PICC line.  Patient completed antifungal therapy for 14 days, repeat cultures were negative, ID was following.  As patient was not improving and going into multiorgan failure, oncology decided to do intrathecal chemotherapy, patient received intrathecal chemotherapy, that led to pancytopenia, patient required multiple blood transfusion and platelet transfusion, she started with lower GI bleeding because of low platelet count, despite multiple platelet transfusion ANC and platelet count remain no and she was requiring frequent transfusions. ?Family meeting was called, initially they decided to proceed with full scope of care including tracheostomy, tracheostomy was performed but patient continued to decline despite maximal medical therapy, again family meeting was conducted, then they decided to keep her DNR and keep her comfortable.  On 10/14/21 vent was discontinued and withdrawal care orders were placed, patient was comfortable and passed at 3:35 AM, patient's family was at bedside. ? ? ? ?Pertinent Labs and Studies  ?Significant Diagnostic Studies ?CT HEAD WO CONTRAST (5MM) ? ?Result Date: 09/11/2021 ?CLINICAL DATA:  Altered mental status. EXAM: CT HEAD WITHOUT CONTRAST TECHNIQUE: Contiguous axial images were obtained from the base of the skull through the vertex without intravenous contrast. RADIATION DOSE REDUCTION: This exam  was performed according to the departmental  dose-optimization program which includes automated exposure control, adjustment of the mA and/or kV according to patient size and/or use of iterative reconstruction technique. COMPARISON:  September 05, 2021. FINDINGS: Brain: Mild chronic ischemic white matter disease is noted. No mass effect or midline shift is noted. Ventricular size is within normal limits. There is no evidence of mass lesion, hemorrhage or acute infarction. Vascular: No hyperdense vessel or unexpected calcification. Skull: Normal. Negative for fracture or focal lesion. Sinuses/Orbits: Mild bilateral maxillary mucosal thickening is noted. Other: None. IMPRESSION: No acute intracranial abnormality seen. Electronically Signed   By: Marijo Conception M.D.   On: 09/11/2021 11:31  ? ?CT HEAD WO CONTRAST (5MM) ? ?Result Date: 09/05/2021 ?CLINICAL DATA:  Altered mental status. EXAM: CT HEAD WITHOUT CONTRAST TECHNIQUE: Contiguous axial images were obtained from the base of the skull through the vertex without intravenous contrast. RADIATION DOSE REDUCTION: This exam was performed according to the departmental dose-optimization program which includes automated exposure control, adjustment of the mA and/or kV according to patient size and/or use of iterative reconstruction technique. COMPARISON:  August 17, 2021. FINDINGS: Brain: Mild diffuse cortical atrophy is noted. No mass effect or midline shift is noted. Ventricular size is within normal limits. There is no evidence of mass lesion, hemorrhage or acute infarction. Vascular: No hyperdense vessel or unexpected calcification. Skull: Normal. Negative for fracture or focal lesion. Sinuses/Orbits: Left frontal, ethmoid and maxillary sinusitis is noted. Other: Fluid is noted in the mastoid air cells bilaterally. IMPRESSION: No acute intracranial abnormality seen. Electronically Signed   By: Marijo Conception M.D.   On: 09/05/2021 14:37  ? ?CT ABDOMEN W CONTRAST ? ?Result  Date: 09/03/2021 ?CLINICAL DATA:  Candidiasis.  Evaluate for intra-abdominal abscess. EXAM: CT ABDOMEN WITH CONTRAST TECHNIQUE: Multidetector CT imaging of the abdomen was performed using the standard protocol following bolus administration of intravenous contrast. RADIATION DOSE REDUCTION: This exam was performed according to the departmental dose-optimization program which includes automated exposure control, adjustment of the mA and/or kV according to patient size and/or use of iterative reconstruction technique. CONTRAST:  154mL OMNIPAQUE IOHEXOL 300 MG/ML  SOLN COMPARISON:  08/28/2021 FINDINGS: Lower chest: Significant interval progression of bilateral interstitial and airspace opacities within both lungs. Hepatobiliary: No focal liver abnormality. Small stone within the gallbladder measures 3 mm. No signs of gallbladder wall thickening or bile duct dilatation. Pancreas: Unremarkable. No pancreatic ductal dilatation or surrounding inflammatory changes. Spleen: Normal in size without focal abnormality. Adrenals/Urinary Tract: Normal right adrenal gland. 2 cm left adrenal nodule is identified measuring 22.6 Hounsfield units, image 37/3. Unchanged from previous exam. No nephrolithiasis, hydronephrosis, or mass. Stomach/Bowel: Enteric tube tip is in the body of the stomach. Enteric contrast material is noted within the gastric lumen. The visualized bowel loops are nondilated. No wall thickening or bowel inflammation identified. Vascular/Lymphatic: No significant vascular findings are present. No enlarged abdominal or pelvic lymph nodes. Other: No free fluid or fluid collections. No signs of intra-abdominal abscess. Diffuse subcutaneous edema is identified compatible with anasarca. Musculoskeletal: No acute osseous findings. IMPRESSION: 1. No signs of intra-abdominal abscess. 2. Significant interval progression of bilateral interstitial and airspace opacities within both lungs. Imaging findings are concerning for  multifocal inflammation/infection. Atypical infection not excluded. 3. Diffuse subcutaneous edema compatible with anasarca. 4. Gallstone. 5. 2 cm left adrenal nodule is identified measuring 22.6 Hounsfield

## 2021-10-06 NOTE — Progress Notes (Signed)
TOD 0335, verified with Badi A. RN.  ?

## 2021-10-06 DEATH — deceased

## 2021-10-18 LAB — FUNGUS CULTURE WITH STAIN

## 2021-10-18 LAB — FUNGUS CULTURE RESULT

## 2021-10-18 LAB — FUNGAL ORGANISM REFLEX

## 2021-10-25 LAB — ACID FAST CULTURE WITH REFLEXED SENSITIVITIES (MYCOBACTERIA): Acid Fast Culture: NEGATIVE

## 2021-12-19 ENCOUNTER — Telehealth: Payer: Self-pay

## 2021-12-19 NOTE — Telephone Encounter (Signed)
Pt's niece called in stated that she waiting on paperwork that was faxed over last month to filled out for pt. Pt's niece would like a call when the paperwork is done.  Domingo Cocking [phone]

## 2021-12-19 NOTE — Telephone Encounter (Signed)
There is paperwork in folder for review.

## 2021-12-31 NOTE — Telephone Encounter (Signed)
Myriam Jacobson (niece) called to follow up on status of paperwork. After reviewing chart, did not see any info past 7.14.23 note that paperwork was in folder. After speaking with Herbert Seta and Apolonio Schneiders, advised niece that paperwork is filled out and is awaiting a provider signature. Once it is faxed back, we will call her to let her know it has been done.

## 2022-01-01 NOTE — Telephone Encounter (Signed)
Patient's niece called to follow up on status of paperwork. Patient called twice today. Advised I would send a message back. Please call patient's niece Myriam Jacobson to advise at [phone]

## 2022-01-02 NOTE — Telephone Encounter (Signed)
Myriam Jacobson (niece) called to follow up on paperwork status. She was advised that the paperwork was completed and faxed over this morning. Myriam Jacobson acknowledged understanding.

## 2022-01-02 NOTE — Telephone Encounter (Signed)
Forms completed and faxed to [phone]

## 2022-04-07 LAB — HISTOPLASMA ANTIGEN, URINE: Histoplasma Antigen, urine: <0.5 (ref <0.5)
